# Patient Record
Sex: Female | Born: 1955 | Race: White | Hispanic: No | Marital: Married | Smoking: Former smoker
Health system: Southern US, Community
[De-identification: ages and names within clinical notes are randomized; demographics above are authoritative.]

## PROBLEM LIST (undated history)

## (undated) DIAGNOSIS — M1990 Unspecified osteoarthritis, unspecified site: Secondary | ICD-10-CM

## (undated) DIAGNOSIS — T4145XA Adverse effect of unspecified anesthetic, initial encounter: Secondary | ICD-10-CM

## (undated) DIAGNOSIS — K589 Irritable bowel syndrome without diarrhea: Secondary | ICD-10-CM

## (undated) DIAGNOSIS — F4481 Dissociative identity disorder: Secondary | ICD-10-CM

## (undated) DIAGNOSIS — R51 Headache: Secondary | ICD-10-CM

## (undated) DIAGNOSIS — T8859XA Other complications of anesthesia, initial encounter: Secondary | ICD-10-CM

## (undated) DIAGNOSIS — N200 Calculus of kidney: Secondary | ICD-10-CM

## (undated) DIAGNOSIS — K219 Gastro-esophageal reflux disease without esophagitis: Secondary | ICD-10-CM

## (undated) DIAGNOSIS — E119 Type 2 diabetes mellitus without complications: Secondary | ICD-10-CM

## (undated) DIAGNOSIS — R519 Headache, unspecified: Secondary | ICD-10-CM

## (undated) DIAGNOSIS — H532 Diplopia: Secondary | ICD-10-CM

## (undated) DIAGNOSIS — G629 Polyneuropathy, unspecified: Secondary | ICD-10-CM

## (undated) DIAGNOSIS — I10 Essential (primary) hypertension: Secondary | ICD-10-CM

## (undated) DIAGNOSIS — J449 Chronic obstructive pulmonary disease, unspecified: Secondary | ICD-10-CM

## (undated) DIAGNOSIS — Z8489 Family history of other specified conditions: Secondary | ICD-10-CM

## (undated) DIAGNOSIS — F319 Bipolar disorder, unspecified: Secondary | ICD-10-CM

## (undated) DIAGNOSIS — R7303 Prediabetes: Secondary | ICD-10-CM

## (undated) DIAGNOSIS — R32 Unspecified urinary incontinence: Secondary | ICD-10-CM

## (undated) DIAGNOSIS — L719 Rosacea, unspecified: Secondary | ICD-10-CM

## (undated) DIAGNOSIS — B029 Zoster without complications: Secondary | ICD-10-CM

## (undated) DIAGNOSIS — G8929 Other chronic pain: Secondary | ICD-10-CM

## (undated) DIAGNOSIS — R232 Flushing: Secondary | ICD-10-CM

## (undated) DIAGNOSIS — F329 Major depressive disorder, single episode, unspecified: Secondary | ICD-10-CM

## (undated) DIAGNOSIS — K279 Peptic ulcer, site unspecified, unspecified as acute or chronic, without hemorrhage or perforation: Secondary | ICD-10-CM

## (undated) DIAGNOSIS — M549 Dorsalgia, unspecified: Secondary | ICD-10-CM

## (undated) DIAGNOSIS — G4730 Sleep apnea, unspecified: Secondary | ICD-10-CM

## (undated) DIAGNOSIS — Z79899 Other long term (current) drug therapy: Secondary | ICD-10-CM

## (undated) DIAGNOSIS — F410 Panic disorder [episodic paroxysmal anxiety] without agoraphobia: Secondary | ICD-10-CM

## (undated) DIAGNOSIS — M542 Cervicalgia: Secondary | ICD-10-CM

## (undated) DIAGNOSIS — F32A Depression, unspecified: Secondary | ICD-10-CM

## (undated) HISTORY — DX: Polyneuropathy, unspecified: G62.9

## (undated) HISTORY — DX: Zoster without complications: B02.9

## (undated) HISTORY — PX: TONSILLECTOMY: SUR1361

## (undated) HISTORY — PX: RECTAL SURGERY: SHX760

## (undated) HISTORY — DX: Flushing: R23.2

## (undated) HISTORY — PX: TOTAL ABDOMINAL HYSTERECTOMY: SHX209

## (undated) HISTORY — DX: Diplopia: H53.2

## (undated) HISTORY — DX: Gastro-esophageal reflux disease without esophagitis: K21.9

## (undated) HISTORY — PX: ABDOMINAL HYSTERECTOMY: SHX81

## (undated) HISTORY — DX: Other long term (current) drug therapy: Z79.899

## (undated) HISTORY — DX: Irritable bowel syndrome, unspecified: K58.9

## (undated) HISTORY — DX: Peptic ulcer, site unspecified, unspecified as acute or chronic, without hemorrhage or perforation: K27.9

## (undated) HISTORY — DX: Rosacea, unspecified: L71.9

---

## 1998-06-10 HISTORY — PX: FOOT ARTHRODESIS: SHX1655

## 2003-10-25 ENCOUNTER — Ambulatory Visit (HOSPITAL_COMMUNITY): Admission: RE | Admit: 2003-10-25 | Discharge: 2003-10-25 | Payer: Self-pay | Admitting: Family Medicine

## 2003-11-17 ENCOUNTER — Encounter (HOSPITAL_COMMUNITY): Admission: RE | Admit: 2003-11-17 | Discharge: 2003-12-17 | Payer: Self-pay | Admitting: Orthopedic Surgery

## 2003-12-20 ENCOUNTER — Encounter (HOSPITAL_COMMUNITY): Admission: RE | Admit: 2003-12-20 | Discharge: 2004-01-19 | Payer: Self-pay | Admitting: Orthopedic Surgery

## 2004-02-17 ENCOUNTER — Ambulatory Visit (HOSPITAL_COMMUNITY): Admission: RE | Admit: 2004-02-17 | Discharge: 2004-02-17 | Payer: Self-pay | Admitting: General Surgery

## 2004-04-11 ENCOUNTER — Ambulatory Visit (HOSPITAL_COMMUNITY): Admission: RE | Admit: 2004-04-11 | Discharge: 2004-04-11 | Payer: Self-pay | Admitting: Family Medicine

## 2004-05-05 ENCOUNTER — Emergency Department (HOSPITAL_COMMUNITY): Admission: EM | Admit: 2004-05-05 | Discharge: 2004-05-05 | Payer: Self-pay | Admitting: Emergency Medicine

## 2004-11-07 ENCOUNTER — Ambulatory Visit (HOSPITAL_COMMUNITY): Admission: RE | Admit: 2004-11-07 | Discharge: 2004-11-07 | Payer: Self-pay | Admitting: Podiatry

## 2004-12-10 ENCOUNTER — Emergency Department (HOSPITAL_COMMUNITY): Admission: EM | Admit: 2004-12-10 | Discharge: 2004-12-10 | Payer: Self-pay | Admitting: Emergency Medicine

## 2005-06-02 ENCOUNTER — Emergency Department (HOSPITAL_COMMUNITY): Admission: EM | Admit: 2005-06-02 | Discharge: 2005-06-02 | Payer: Self-pay | Admitting: Emergency Medicine

## 2005-06-24 ENCOUNTER — Emergency Department (HOSPITAL_COMMUNITY): Admission: EM | Admit: 2005-06-24 | Discharge: 2005-06-24 | Payer: Self-pay | Admitting: Emergency Medicine

## 2005-06-28 ENCOUNTER — Emergency Department (HOSPITAL_COMMUNITY): Admission: EM | Admit: 2005-06-28 | Discharge: 2005-06-28 | Payer: Self-pay | Admitting: Emergency Medicine

## 2005-07-17 ENCOUNTER — Encounter (INDEPENDENT_AMBULATORY_CARE_PROVIDER_SITE_OTHER): Payer: Self-pay | Admitting: General Surgery

## 2005-07-17 ENCOUNTER — Ambulatory Visit (HOSPITAL_COMMUNITY): Admission: RE | Admit: 2005-07-17 | Discharge: 2005-07-17 | Payer: Self-pay | Admitting: General Surgery

## 2005-09-17 ENCOUNTER — Emergency Department (HOSPITAL_COMMUNITY): Admission: EM | Admit: 2005-09-17 | Discharge: 2005-09-17 | Payer: Self-pay | Admitting: Emergency Medicine

## 2005-10-21 ENCOUNTER — Ambulatory Visit (HOSPITAL_COMMUNITY): Admission: RE | Admit: 2005-10-21 | Discharge: 2005-10-21 | Payer: Self-pay | Admitting: Family Medicine

## 2006-02-25 ENCOUNTER — Ambulatory Visit (HOSPITAL_COMMUNITY): Admission: RE | Admit: 2006-02-25 | Discharge: 2006-02-25 | Payer: Self-pay | Admitting: Family Medicine

## 2006-08-11 ENCOUNTER — Ambulatory Visit (HOSPITAL_COMMUNITY): Admission: RE | Admit: 2006-08-11 | Discharge: 2006-08-11 | Payer: Self-pay | Admitting: Family Medicine

## 2007-02-27 ENCOUNTER — Ambulatory Visit (HOSPITAL_COMMUNITY): Admission: RE | Admit: 2007-02-27 | Discharge: 2007-02-27 | Payer: Self-pay | Admitting: Family Medicine

## 2007-06-15 ENCOUNTER — Ambulatory Visit (HOSPITAL_COMMUNITY)
Admission: RE | Admit: 2007-06-15 | Discharge: 2007-06-15 | Payer: Self-pay | Admitting: Physical Medicine and Rehabilitation

## 2007-07-29 ENCOUNTER — Ambulatory Visit (HOSPITAL_COMMUNITY): Admission: RE | Admit: 2007-07-29 | Discharge: 2007-07-29 | Payer: Self-pay | Admitting: Family Medicine

## 2009-03-22 ENCOUNTER — Ambulatory Visit (HOSPITAL_COMMUNITY): Admission: RE | Admit: 2009-03-22 | Discharge: 2009-03-22 | Payer: Self-pay | Admitting: Podiatry

## 2009-03-22 ENCOUNTER — Encounter (INDEPENDENT_AMBULATORY_CARE_PROVIDER_SITE_OTHER): Payer: Self-pay | Admitting: Podiatry

## 2009-06-24 ENCOUNTER — Emergency Department (HOSPITAL_COMMUNITY): Admission: EM | Admit: 2009-06-24 | Discharge: 2009-06-24 | Payer: Self-pay | Admitting: Emergency Medicine

## 2009-09-08 ENCOUNTER — Encounter: Admission: RE | Admit: 2009-09-08 | Discharge: 2009-09-08 | Payer: Self-pay | Admitting: Gastroenterology

## 2009-09-08 ENCOUNTER — Encounter: Payer: Self-pay | Admitting: Gastroenterology

## 2009-09-18 ENCOUNTER — Ambulatory Visit (HOSPITAL_COMMUNITY): Admission: RE | Admit: 2009-09-18 | Discharge: 2009-09-18 | Payer: Self-pay | Admitting: Family Medicine

## 2009-11-29 ENCOUNTER — Ambulatory Visit (HOSPITAL_COMMUNITY): Admission: RE | Admit: 2009-11-29 | Discharge: 2009-11-29 | Payer: Self-pay | Admitting: Rheumatology

## 2010-06-06 ENCOUNTER — Emergency Department (HOSPITAL_COMMUNITY)
Admission: EM | Admit: 2010-06-06 | Discharge: 2010-06-06 | Payer: Self-pay | Source: Home / Self Care | Admitting: Emergency Medicine

## 2010-06-30 ENCOUNTER — Encounter: Payer: Self-pay | Admitting: Family Medicine

## 2010-07-01 ENCOUNTER — Encounter: Payer: Self-pay | Admitting: Family Medicine

## 2010-07-01 ENCOUNTER — Encounter: Payer: Self-pay | Admitting: Gastroenterology

## 2010-08-14 ENCOUNTER — Ambulatory Visit (HOSPITAL_COMMUNITY): Payer: Self-pay | Admitting: Physical Therapy

## 2010-08-21 ENCOUNTER — Ambulatory Visit (HOSPITAL_COMMUNITY)
Admission: RE | Admit: 2010-08-21 | Discharge: 2010-08-21 | Disposition: A | Discharge status: Home / Self Care | Payer: Medicaid Other | Source: Ambulatory Visit | Attending: Orthopedic Surgery | Admitting: Orthopedic Surgery

## 2010-08-21 DIAGNOSIS — IMO0001 Reserved for inherently not codable concepts without codable children: Secondary | ICD-10-CM | POA: Insufficient documentation

## 2010-08-21 DIAGNOSIS — M25579 Pain in unspecified ankle and joints of unspecified foot: Secondary | ICD-10-CM | POA: Insufficient documentation

## 2010-08-21 DIAGNOSIS — M25676 Stiffness of unspecified foot, not elsewhere classified: Secondary | ICD-10-CM | POA: Insufficient documentation

## 2010-08-21 DIAGNOSIS — M25673 Stiffness of unspecified ankle, not elsewhere classified: Secondary | ICD-10-CM | POA: Insufficient documentation

## 2010-08-21 DIAGNOSIS — M6281 Muscle weakness (generalized): Secondary | ICD-10-CM | POA: Insufficient documentation

## 2010-08-21 DIAGNOSIS — R262 Difficulty in walking, not elsewhere classified: Secondary | ICD-10-CM | POA: Insufficient documentation

## 2010-09-13 LAB — BASIC METABOLIC PANEL WITH GFR
BUN: 16 mg/dL (ref 6–23)
CO2: 30 meq/L (ref 19–32)
Calcium: 9.8 mg/dL (ref 8.4–10.5)
Chloride: 103 meq/L (ref 96–112)
Creatinine, Ser: 0.69 mg/dL (ref 0.4–1.2)
GFR calc Af Amer: >60 mL/min (ref >60)
GFR calc non Af Amer: >60 mL/min (ref >60)
Glucose, Bld: 89 mg/dL (ref 70–99)
Potassium: 4 meq/L (ref 3.5–5.1)
Sodium: 139 meq/L (ref 135–145)

## 2010-09-13 LAB — HEMOGLOBIN AND HEMATOCRIT, BLOOD
HCT: 42.7 % (ref 36.0–46.0)
Hemoglobin: 14.5 g/dL (ref 12.0–15.0)

## 2010-10-26 NOTE — Op Note (Signed)
NAME:  Cynthia Deleon, MASK             ACCOUNT NO.:  [id number]   MEDICAL RECORD NO.:  [id number]          PATIENT TYPE:  AMB   LOCATION:  DAY                           FACILITY:  [facility]   PHYSICIAN:  Dalia Heading, M.D.  DATE OF BIRTH:  1955-08-02   DATE OF PROCEDURE:  07/17/2005  DATE OF DISCHARGE:                                 OPERATIVE REPORT   PREOPERATIVE DIAGNOSIS:  Cyst, right earlobe, and foreign body, face.   POSTOPERATIVE DIAGNOSIS:  Cyst, right earlobe, and foreign body, face.   PROCEDURE:  Excision of cyst, right earlobe, and foreign body removal, face.   SURGEON:  Dr. Franky Macho.   ANESTHESIA:  General.   INDICATIONS:  The patient is a 55 year old white female who presents with a  recurrent, enlarging cyst of the right ear lobe as well as the tip of the  lead pencil in the subcutaneous tissue of the right jaw. The patient now  comes for excision of both the cyst on the right earlobe and removal of  foreign body, face at the right jaw line. Risks and benefits of the  procedures including bleeding and infection were fully explained to the  patient, who gave informed consent.   PROCEDURE NOTE:  The patient was placed in supine position. After general  anesthesia was administered, the right ear and right jaw line of the face  were prepped and draped using the usual sterile technique with Betadine.  Surgical site confirmation was performed.   An incision was made posteriorly in the right ear lobe. A sebaceous cyst was  found. This was removed along with its wall without difficulty. Any bleeding  was controlled using Bovie electrocautery. One percent Xylocaine was  instilled into the region. The incision was closed using 6-0 nylon  interrupted sutures. Bacitracin ointment was then applied.   Next, an elliptical incision was made around the pencil tip that was  imbedded in the skin along the right jaw line. This was excised without  difficulty. The incision was closed  using 6-0 nylon interrupted sutures. One  percent Xylocaine was instilled. Bacitracin ointment was then applied.   All tape and needle counts were correct at the end of the procedure. The  patient was awakened and transferred to PACU in stable condition.   COMPLICATIONS:  None.   SPECIMEN:  Cyst, right earlobe.   BLOOD LOSS:  Minimal.      Dalia Heading, M.D.  Electronically Signed     MAJ/MEDQ  D:  07/17/2005  T:  07/17/2005  Job:  [id number]   cc:   [facility] Department

## 2010-10-26 NOTE — Op Note (Signed)
NAME:  Cynthia Deleon, Cynthia Deleon             ACCOUNT NO.:  [id number]   MEDICAL RECORD NO.:  [id number]          PATIENT TYPE:  AMB   LOCATION:  DAY                           FACILITY:  [facility]   PHYSICIAN:  Cody M. Ulice Brilliant, D.P.M.  DATE OF BIRTH:  [birth year]   DATE OF PROCEDURE:  11/07/2004  DATE OF DISCHARGE:                                 OPERATIVE REPORT   PREOPERATIVE DIAGNOSES:  Neuroma, second web space, left foot; long second  metatarsal, left foot.   POSTOPERATIVE DIAGNOSES:  Neuroma, second web space, left foot;  long second  metatarsal, left foot.   PROCEDURE PERFORMED:  Excision of neuroma, second web space, left foot;  shortening second metatarsal osteotomy, left foot.   SURGEON:  Denny Peon. Ulice Brilliant, D.P.M.   ANESTHESIA:  MAC.   INDICATIONS FOR SURGERY:  A 56 year old white female who has had a greater  than two-year history of pain under the ball of the left foot centered  around the second and third toes.  The pain has been treated conservatively  with injection therapy.  The patient is clinically noted to have pain to  palpation distal to the second metatarsal head and in the second web space.  Radiographs revealed that she has a long second metatarsal approximately  0.75-1 cm longer than the third or the first.  The patient has a neuroma in  the second web space and a long second metatarsal which is accounting for  chronic capsulitis.   DESCRIPTION OF PROCEDURE:  Cynthia Deleon is brought into the OR and placed  on the table in the supine position.  IV sedation is established.  A  posterior tibial nerve block is performed about the left ankle.  Further  local anesthesia is administered dorsal and between the metatarsals, first,  second, and third, left foot.  A pneumatic ankle tourniquet is then applied  over appropriate cast padding to her left ankle.  Her foot is prepped and  draped in the usual aseptic fashion.  An Ace bandage is utilized to  exsanguinate her foot.  The  tourniquet is inflated to 250 mmHg.   PROCEDURE:  Excision of neuroma, second web space, with shortening second  metatarsal osteotomy.  Attention is directed to the dorsal aspect of the  left foot.  A curvilinear skin incision is made beginning in the second web  space extending proximally and then curving medially over the second  metatarsal head and then curving back proximally along the second metatarsal  shaft.  The incision is deepened via sharp and blunt dissection through  subcutaneous tissues.  Any crossing vessels that could not be adequately  retracted are clamped and cauterized.  Attention is directed first into the  second web space through this skin incision.  The incision is deepened  through the second web space to the actual neuroma itself.  This is noted to  be relative hypertrophic in nature, large, swollen.  Its digital branches  are traced out onto the medial aspect of the third toe and lateral aspect of  the second toe.  It is then transected at this level utilizing iris  scissors  and then dissected back proximally to a level just proximal to the  metatarsal heads utilizing a combination of sharp and blunt dissection.  At  this level, it is then excised.  The wound is inspected and found to be free  of any remaining neural branches from this neuroma.  The wound is flushed  with copious amounts of irrigant, and two horizontal mattress sutures of 4-0  Prolene are utilized to close subcutaneous tissue in this region.   Attention is then directed to the second metatarsal head.  The second  metatarsal is appreciated via blunt palpation, and the extensor tendon to  the second toe is undermined and retracted laterally.  A second metatarsal  and capsular incision is then made.  The capsular and periosteal tissues are  reflected away from the surgical neck of the bone.  An oblique osteotomy is  the first begun with an #62 blade on the oscillating saw and then completed   through the plantar cortical surface with the 62 blade.  The amount of  shortening via this cut is appreciated, and the metatarsal is repositioned  proximally so that it is equal in length to the third and to the first.  This is then fixated via 2.0 x 12-mm OsteoMed screw in standard fashion with  good purchase noted.  The osteotomy is deemed stable.  The wound is flushed  with copious amounts of irrigant.  Redundant bone dorsally is removed with a  bone rongeur.  Capsular and periosteal tissues are then reapproximated and  closed with 4-0 Vicryl.  Subcutaneous tissues are reapproximated and closed  over the tendon utilizing 4-0 Vicryl.  The skin is closed with 4-0 Vicryl in  a subcuticular suture along the entire length of the incision.  A  postoperative injection of Hexadrol is dispensed.  Steri-Strips are applied  across the incision.  A Betadine-soaked Adaptic dressing and a dry sterile  compressive dressing follows.  The tourniquet is deflated with tourniquet  time spanning 45 minutes.   Cynthia Deleon tolerates the anesthesia and procedure well.  She is  transported to [facility] without incident.  While there, a list of  written instructions are explained to her.  A prescription for oxycodone  5/500 mg Tylenol is dispensed.  Phenergan 25 mg, dispense 25, is dispensed.  A list of written instructions are explained.  A cam walker and a Darco  surgical shoe are dispensed.  She will be seen within seven days for first  postoperative visit.      CMD/MEDQ  D:  11/07/2004  T:  11/07/2004  Job:  [id number]

## 2010-10-26 NOTE — H&P (Signed)
NAME:  Cynthia Deleon, Cynthia Deleon NO.:  [id number]   MEDICAL RECORD NO.:  [id number]          PATIENT TYPE:  EMS   LOCATION:  ED                            FACILITY:  [facility]   PHYSICIAN:  Dalia Heading, M.D.  DATE OF BIRTH:  June 04, 1956   DATE OF ADMISSION:  06/28/2005  DATE OF DISCHARGE:  01/19/2007LH                                HISTORY & PHYSICAL   CHIEF COMPLAINT:  Recurrent cyst, right ear lobe, and foreign body on face.   HISTORY OF PRESENT ILLNESS:  The patient is a 55 year old white female who  presents with a recurrent, enlarging cyst in the right earlobe.  This was  initially excised two years ago and has recurred.  She also has the tip of a  lead point from a pencil embedded in the subcutaneous tissue in the right  jaw region.  She would also like that removed.   PAST MEDICAL HISTORY:  Reflux disease.   PAST SURGICAL HISTORY:  Hysterectomy, multiple surgeries for foot, eyes and  nose.   CURRENT MEDICATIONS:  1.  Nexium.  2.  Hormone pill.   ALLERGIES:  HYDROCODONE.   SOCIAL HISTORY:  The patient smokes a half a pack of cigarettes a day.  She  denies any alcohol use.   PHYSICAL EXAMINATION:  GENERAL APPEARANCE:  The patient is a well-developed,  well-nourished white female in no acute distress.  LUNGS:  Clear to auscultation with good breath sounds bilaterally.  HEART:  Regular rate and rhythm without S3, S4 or murmurs.  SKIN:  A cyst in the right earlobe as well as a foreign body in the  subcutaneous tissue of the right jaw line.   IMPRESSION:  1.  Recurrent cyst, right earlobe.  2.  Foreign body, face.   PLAN:  The patient was scheduled for excision of the cyst of the right  earlobe and excision of the foreign body on the face on July 17, 2005.  Risks and benefits of the procedures including bleeding, infection, and  recurrence of the cysts were fully explained to the patient.  Gave informed  consent.      Dalia Heading, M.D.  Electronically Signed     MAJ/MEDQ  D:  07/09/2005  T:  07/09/2005  Job:  [id number]   cc:   Short Stay at [facility] Department of [facility]

## 2010-10-26 NOTE — H&P (Signed)
NAME:  Cynthia Deleon, Cynthia Deleon                       ACCOUNT NO.:  [id number]   MEDICAL RECORD NO.:  [id number]                   PATIENT TYPE:  REC   LOCATION:  REH                                  FACILITY:  [facility]   PHYSICIAN:  Dalia Heading, M.D.               DATE OF BIRTH:  23-Feb-1956   DATE OF ADMISSION:  DATE OF DISCHARGE:                                HISTORY & PHYSICAL   CHIEF COMPLAINT:  Cyst, right ear lobe.   HISTORY OF PRESENT ILLNESS:  The patient is a 55 year old white female who  was referred for excision of a cyst on the right earlobe.  It has been  present for some time, but has been draining.   PAST MEDICAL HISTORY:  Reflux disease.   PAST SURGICAL HISTORY:  1.  Hysterectomy.  2.  Nose, eye and foot surgeries.   CURRENT MEDICATIONS:  1.  Nexium.  2.  Hormone pill.   ALLERGIES:  Hydrocodone.   SOCIAL HISTORY:  The patient smokes one-half pack of cigarettes a day.  She  denies any significant alcohol use.   PHYSICAL EXAMINATION:  GENERAL:  The patient is a well-developed, well-  nourished white female, in no acute distress.  VITAL SIGNS:  She is afebrile.  Vital signs are stable.  LUNGS:  Clear to auscultation with equal breath sounds bilaterally.  HEART:  Examination reveals a regular rate and rhythm without S3, S4 or  murmurs.  HEENT:  Right ear examination reveals a 75-mm oval mass in the right  earlobe.  It is mobile.  No drainage has been noted.   IMPRESSION:  Cyst, right earlobe.   PLAN:  The patient is scheduled for excision of the cyst, right earlobe, on  February 17, 2004.  The risks and benefits of the procedure were fully  explained to the patient who gave informed consent.    ___________________________________________                                         Dalia Heading, M.D.   MAJ/MEDQ  D:  02/16/2004  T:  02/16/2004  Job:  [id number]   cc:   Health Department

## 2010-10-26 NOTE — Op Note (Signed)
NAME:  Cynthia Deleon, Cynthia Deleon                       ACCOUNT NO.:  [id number]   MEDICAL RECORD NO.:  [id number]                   PATIENT TYPE:  AMB   LOCATION:  DAY                                  FACILITY:  [facility]   PHYSICIAN:  Dalia Heading, M.D.               DATE OF BIRTH:  [birth year]   DATE OF PROCEDURE:  02/17/2004  DATE OF DISCHARGE:                                 OPERATIVE REPORT   PREOPERATIVE DIAGNOSIS:  Mass, right earlobe.   POSTOPERATIVE DIAGNOSIS:  Sebaceous cyst, right earlobe.   PROCEDURE:  Excision of sebaceous cyst, right earlobe.   SURGEON:  Dalia Heading, M.D.   ANESTHESIA:  1% Xylocaine.   INDICATIONS FOR PROCEDURE:  The patient is a 55 year old white female who  presents with an enlarging mass in the right earlobe.  The risks and  benefits of the procedure were fully explained to the patient who gave  informed consent.   DESCRIPTION OF PROCEDURE:  The patient was placed in the supine position.  The right earlobe was prepped and draped using the usual sterile technique  with Betadine.  Surgical site confirmation was performed.   Xylocaine 1% was used for local anesthesia.  This was instilled into the  posterior region of the right earlobe.  An incision was then made, and a  sebaceous cyst was found.  This was excised without difficulty.  Any  bleeding was controlled using Bovie electrocautery.  The specimen was sent  to pathology for further examination.  The skin was closed using a 5-0 nylon  interrupted suture.  Neosporin ointment was then applied.   All tape and needle counts were correct at the end of the procedure.  The  patient was discharged in stable condition.   COMPLICATIONS:  None.   SPECIMENS:  Sebaceous cyst, right earlobe.   ESTIMATED BLOOD LOSS:  Minimal.      ___________________________________________                                            Dalia Heading, M.D.   MAJ/MEDQ  D:  02/17/2004  T:  02/17/2004  Job:  [id number]

## 2010-10-26 NOTE — H&P (Signed)
NAME:  Cynthia Deleon, Cynthia Deleon             ACCOUNT NO.:  [id number]   MEDICAL RECORD NO.:  [id number]          PATIENT TYPE:  AMB   LOCATION:  DAY                           FACILITY:  [facility]   PHYSICIAN:  Cody M. Ulice Brilliant, D.P.M.  DATE OF BIRTH:  08-22-1955   DATE OF ADMISSION:  DATE OF DISCHARGE:  LH                                HISTORY & PHYSICAL   This is a preoperative H&P on a patient who is scheduled for surgery  tomorrow at [facility].   HISTORY OF PRESENT ILLNESS:  Cynthia Deleon has had longstanding pain in the  plantar aspect of both feet in the balls of the feet.  The left is worse  than right.  This has been present for several months, actually years.  She  has been treated on several occasions for this, dating back to November 23, 2003.  Cynthia Deleon has previously had an Eliberto Ivory bunionectomy performed on the left  foot dating back to the late 1990s.  Since that time she has had some  increase in pain in the ball of her foot that has gotten worse over the last  2 years.  She has been treated with injection therapy, which has helped on  occasion.  However, it is getting to the point now where that is no longer  that helpful.   PAST MEDICAL HISTORY:  Significant for anxiety.  She also has a history of  esophageal reflux.   The medications she is taking regularly at this point include hormone  replacement therapy, Nexium and Valium.   ALLERGIES:  SULFA medication.   PAST SURGICAL HISTORY:  Includes previous left foot surgery, hysterectomy,  rectal surgery times two, tonsillectomy and nasal surgery.   OBJECTIVE:  Cynthia Deleon is a tall, relatively anxious white female noted to have  palpable pedal pulses.  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  She is uncomfortable with deep  palpation in the second webspace and at the second MTP plantarly.  Radiographs have been taken, and she has a residual long second metatarsal.   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   PLAN:  She needs to have the neuroma excised.  I have also suggested to  Cynthia Deleon that while we are doing the neuroma we should consider shortening the  second metatarsal to reduce weightbearing pressure in the area, and she is  in agreement.  We are going to do this at [facility] under  monitored anesthesia care.  Cynthia Deleon has read her consent form,  apparently understood and signed.    CMD/MEDQ  D:  11/06/2004  T:  11/06/2004  Job:  [id number]

## 2011-01-18 ENCOUNTER — Emergency Department (HOSPITAL_COMMUNITY)
Admission: EM | Admit: 2011-01-18 | Discharge: 2011-01-19 | Disposition: A | Discharge status: Home / Self Care | Payer: Medicaid Other | Attending: Emergency Medicine | Admitting: Emergency Medicine

## 2011-01-18 ENCOUNTER — Encounter: Payer: Self-pay | Admitting: Emergency Medicine

## 2011-01-18 DIAGNOSIS — F17200 Nicotine dependence, unspecified, uncomplicated: Secondary | ICD-10-CM | POA: Insufficient documentation

## 2011-01-18 DIAGNOSIS — N200 Calculus of kidney: Secondary | ICD-10-CM | POA: Insufficient documentation

## 2011-01-18 DIAGNOSIS — F319 Bipolar disorder, unspecified: Secondary | ICD-10-CM | POA: Insufficient documentation

## 2011-01-18 HISTORY — DX: Bipolar disorder, unspecified: F31.9

## 2011-01-18 NOTE — ED Notes (Signed)
Patient c/o RLQ pain since 2130; states she has vomited x 3.

## 2011-01-18 NOTE — ED Notes (Signed)
Pt reports right side & lower abdominal pain. Pt states she has vomited x3 since pain started.

## 2011-01-19 ENCOUNTER — Emergency Department (HOSPITAL_COMMUNITY): Payer: Medicaid Other

## 2011-01-19 LAB — BASIC METABOLIC PANEL
BUN: 16 mg/dL (ref 6–23)
CO2: 24 mEq/L (ref 19–32)
Calcium: 9.6 mg/dL (ref 8.4–10.5)
Chloride: 101 mEq/L (ref 96–112)
Creatinine, Ser: 0.87 mg/dL (ref 0.50–1.10)
GFR calc Af Amer: >60 mL/min (ref >60)
GFR calc non Af Amer: >60 mL/min (ref >60)
Glucose, Bld: 115 mg/dL — ABNORMAL HIGH (ref 70–99)
Potassium: 3.2 mEq/L — ABNORMAL LOW (ref 3.5–5.1)
Sodium: 140 mEq/L (ref 135–145)

## 2011-01-19 LAB — URINALYSIS, ROUTINE W REFLEX MICROSCOPIC
Bilirubin Urine: NEGATIVE
Glucose, UA: NEGATIVE mg/dL
Ketones, ur: NEGATIVE mg/dL
Leukocytes, UA: NEGATIVE
Nitrite: NEGATIVE
Protein, ur: NEGATIVE mg/dL
Specific Gravity, Urine: >1.03 — ABNORMAL HIGH (ref 1.005–1.030)
Urobilinogen, UA: 0.2 mg/dL (ref 0.0–1.0)
pH: 5.5 (ref 5.0–8.0)

## 2011-01-19 LAB — URINE MICROSCOPIC-ADD ON

## 2011-01-19 LAB — DIFFERENTIAL
Eosinophils Absolute: 0.3 10*3/uL (ref 0.0–0.7)
Lymphs Abs: 4 10*3/uL (ref 0.7–4.0)
Monocytes Relative: 6 % (ref 3–12)
Neutrophils Relative %: 62 % (ref 43–77)

## 2011-01-19 LAB — CBC
Hemoglobin: 16.4 g/dL — ABNORMAL HIGH (ref 12.0–15.0)
MCH: 31.5 pg (ref 26.0–34.0)
RBC: 5.2 MIL/uL — ABNORMAL HIGH (ref 3.87–5.11)

## 2011-01-19 MED ORDER — CIPROFLOXACIN HCL 500 MG PO TABS: 500.0000 mg | ORAL_TABLET | Freq: Two times a day (BID) | ORAL | Status: AC

## 2011-01-19 MED ORDER — ONDANSETRON HCL 4 MG PO TABS: 4.0000 mg | ORAL_TABLET | Freq: Four times a day (QID) | ORAL | Status: AC

## 2011-01-19 MED ORDER — SODIUM CHLORIDE 0.9 % IV SOLN
Freq: Once | INTRAVENOUS | Status: AC
  Administered 2011-01-19: 1000 mL via INTRAVENOUS

## 2011-01-19 MED ORDER — ONDANSETRON HCL 4 MG/2ML IJ SOLN
4.0000 mg | Freq: Once | INTRAMUSCULAR | Status: AC
  Administered 2011-01-19: 4 mg via INTRAVENOUS
  Filled 2011-01-19: qty 2

## 2011-01-19 MED ORDER — HYDROMORPHONE HCL 1 MG/ML IJ SOLN
1.0000 mg | Freq: Once | INTRAMUSCULAR | Status: AC
  Administered 2011-01-19: 1 mg via INTRAVENOUS
  Filled 2011-01-19: qty 1

## 2011-01-19 MED ORDER — HYDROCODONE-ACETAMINOPHEN 5-325 MG PO TABS: 1.0000 | ORAL_TABLET | ORAL | Status: AC | PRN

## 2011-01-19 MED ORDER — KETOROLAC TROMETHAMINE 30 MG/ML IJ SOLN
30.0000 mg | Freq: Once | INTRAMUSCULAR | Status: AC
  Administered 2011-01-19: 30 mg via INTRAVENOUS
  Filled 2011-01-19: qty 1

## 2011-01-19 MED ORDER — CIPROFLOXACIN HCL 250 MG PO TABS
500.0000 mg | ORAL_TABLET | Freq: Once | ORAL | Status: AC
  Administered 2011-01-19: 500 mg via ORAL
  Filled 2011-01-19: qty 2

## 2011-01-19 NOTE — ED Provider Notes (Signed)
History     CSN: [id number] Arrival date & time: 01/18/2011 11:33 PM  Chief Complaint  Patient presents with  . Abdominal Pain   HPI Comments: Seen 41  Patient is a 55 y.o. female presenting with abdominal pain. The history is provided by the patient.  Abdominal Pain The primary symptoms of the illness include abdominal pain, nausea and vomiting. The current episode started 1 to 2 hours ago (patient ate mashed potatoes for dinner then developed  severe RLQ abdominal pain. Radiates to the right flank). The onset of the illness was sudden. The problem has been rapidly worsening.  The abdominal pain has been rapidly worsening since its onset. The abdominal pain is located in the RLQ. The abdominal pain radiates to the right flank. The severity of the abdominal pain is 9/10.  The patient has not had a change in bowel habit. Associated symptoms comments: Difficulty urinating.    Past Medical History  Diagnosis Date  . Bipolar 1 disorder     Past Surgical History  Procedure Date  . Abdominal hysterectomy   . Tonsillectomy     No family history on file.  History  Substance Use Topics  . Smoking status: Current Everyday Smoker -- 0.5 packs/day    Types: Cigarettes  . Smokeless tobacco: Not on file  . Alcohol Use: Yes     rarely    OB History    Grav Para Term Preterm Abortions TAB SAB Ect Mult Living                  Review of Systems  Gastrointestinal: Positive for nausea, vomiting and abdominal pain.  All other systems reviewed and are negative.    Physical Exam  BP 151/103  Pulse 82  Temp(Src) 97.5 F (36.4 C) (Oral)  Resp 18  Ht 5\' 8"  (1.727 m)  Wt 172 lb (78.019 kg)  BMI 26.15 kg/m2  SpO2 97%  Physical Exam  Nursing note and vitals reviewed. Constitutional: She is oriented to person, place, and time. She appears well-developed and well-nourished.  HENT:  Head: Normocephalic and atraumatic.  Eyes: EOM are normal.  Neck: Normal range of motion. Neck  supple.  Cardiovascular: Normal rate, normal heart sounds and intact distal pulses.   Pulmonary/Chest: Effort normal and breath sounds normal.  Abdominal: Soft.  Genitourinary:       No cva tenderness  Musculoskeletal: Normal range of motion.  Neurological: She is alert and oriented to person, place, and time.  Skin: Skin is warm and dry.    ED Course  Procedures  MDM Patient with first kidney stone. Family h/o stones. CT confirmation of 2 mm R ureterovesicular stone. Pain addressed with success. Discharge with referral to urologist with Rx for analgesic, antibiotic, and antiemetic. MDM Reviewed: nursing note and vitals Interpretation: labs and CT scan         Nicoletta Dress. Colon Branch, MD 01/19/11 [id number]

## 2011-08-02 ENCOUNTER — Emergency Department (HOSPITAL_COMMUNITY)
Admission: EM | Admit: 2011-08-02 | Discharge: 2011-08-02 | Disposition: A | Discharge status: Other Acute Inpatient Hospital | Payer: Medicaid Other | Attending: Emergency Medicine | Admitting: Emergency Medicine

## 2011-08-02 ENCOUNTER — Encounter (HOSPITAL_COMMUNITY): Payer: Self-pay

## 2011-08-02 ENCOUNTER — Inpatient Hospital Stay (HOSPITAL_COMMUNITY)
Admission: AD | Admit: 2011-08-02 | Discharge: 2011-08-08 | DRG: 885 | Disposition: A | Discharge status: Home / Self Care | Payer: 59 | Source: Ambulatory Visit | Attending: Psychiatry | Admitting: Psychiatry

## 2011-08-02 DIAGNOSIS — F17200 Nicotine dependence, unspecified, uncomplicated: Secondary | ICD-10-CM

## 2011-08-02 DIAGNOSIS — M542 Cervicalgia: Secondary | ICD-10-CM

## 2011-08-02 DIAGNOSIS — IMO0002 Reserved for concepts with insufficient information to code with codable children: Secondary | ICD-10-CM

## 2011-08-02 DIAGNOSIS — Z882 Allergy status to sulfonamides status: Secondary | ICD-10-CM

## 2011-08-02 DIAGNOSIS — F4310 Post-traumatic stress disorder, unspecified: Secondary | ICD-10-CM

## 2011-08-02 DIAGNOSIS — M129 Arthropathy, unspecified: Secondary | ICD-10-CM

## 2011-08-02 DIAGNOSIS — F3160 Bipolar disorder, current episode mixed, unspecified: Principal | ICD-10-CM

## 2011-08-02 DIAGNOSIS — F607 Dependent personality disorder: Secondary | ICD-10-CM

## 2011-08-02 DIAGNOSIS — Z888 Allergy status to other drugs, medicaments and biological substances status: Secondary | ICD-10-CM

## 2011-08-02 DIAGNOSIS — G8929 Other chronic pain: Secondary | ICD-10-CM

## 2011-08-02 DIAGNOSIS — Z79899 Other long term (current) drug therapy: Secondary | ICD-10-CM

## 2011-08-02 DIAGNOSIS — F1910 Other psychoactive substance abuse, uncomplicated: Secondary | ICD-10-CM

## 2011-08-02 DIAGNOSIS — F1994 Other psychoactive substance use, unspecified with psychoactive substance-induced mood disorder: Secondary | ICD-10-CM

## 2011-08-02 DIAGNOSIS — F3164 Bipolar disorder, current episode mixed, severe, with psychotic features: Secondary | ICD-10-CM | POA: Insufficient documentation

## 2011-08-02 DIAGNOSIS — F3130 Bipolar disorder, current episode depressed, mild or moderate severity, unspecified: Secondary | ICD-10-CM | POA: Insufficient documentation

## 2011-08-02 DIAGNOSIS — R45851 Suicidal ideations: Secondary | ICD-10-CM

## 2011-08-02 HISTORY — DX: Unspecified osteoarthritis, unspecified site: M19.90

## 2011-08-02 LAB — DIFFERENTIAL
Basophils Relative: 0 % (ref 0–1)
Eosinophils Absolute: 0.2 10*3/uL (ref 0.0–0.7)
Eosinophils Relative: 1 % (ref 0–5)
Lymphs Abs: 3.1 10*3/uL (ref 0.7–4.0)
Monocytes Absolute: 0.7 10*3/uL (ref 0.1–1.0)
Monocytes Relative: 5 % (ref 3–12)

## 2011-08-02 LAB — URINALYSIS, ROUTINE W REFLEX MICROSCOPIC
Bilirubin Urine: NEGATIVE
Glucose, UA: NEGATIVE mg/dL
Hgb urine dipstick: NEGATIVE
Ketones, ur: NEGATIVE mg/dL
Protein, ur: NEGATIVE mg/dL
Urobilinogen, UA: 0.2 mg/dL (ref 0.0–1.0)

## 2011-08-02 LAB — CBC
MCH: 31.6 pg (ref 26.0–34.0)
MCHC: 34.3 g/dL (ref 30.0–36.0)
MCV: 92.1 fL (ref 78.0–100.0)
WBC: 12.3 10*3/uL — ABNORMAL HIGH (ref 4.0–10.5)

## 2011-08-02 LAB — COMPREHENSIVE METABOLIC PANEL
ALT: 10 U/L (ref 0–35)
Albumin: 3.7 g/dL (ref 3.5–5.2)
Calcium: 10 mg/dL (ref 8.4–10.5)
GFR calc Af Amer: >90 mL/min (ref >90)
Glucose, Bld: 83 mg/dL (ref 70–99)
Sodium: 138 mEq/L (ref 135–145)
Total Protein: 7.2 g/dL (ref 6.0–8.3)

## 2011-08-02 LAB — ETHANOL: Alcohol, Ethyl (B): <11 mg/dL (ref 0–11)

## 2011-08-02 LAB — RAPID URINE DRUG SCREEN, HOSP PERFORMED
Amphetamines: NOT DETECTED
Barbiturates: NOT DETECTED
Opiates: NOT DETECTED
Tetrahydrocannabinol: NOT DETECTED

## 2011-08-02 LAB — GLUCOSE, CAPILLARY: Glucose-Capillary: 138 mg/dL — ABNORMAL HIGH (ref 70–99)

## 2011-08-02 LAB — ACETAMINOPHEN LEVEL: Acetaminophen (Tylenol), Serum: <15 ug/mL (ref 10–30)

## 2011-08-02 MED ORDER — ONDANSETRON HCL 8 MG PO TABS
4.0000 mg | ORAL_TABLET | Freq: Three times a day (TID) | ORAL | Status: DC | PRN
  Administered 2011-08-02: 4 mg via ORAL
  Filled 2011-08-02: qty 1

## 2011-08-02 MED ORDER — ALPRAZOLAM ER 0.5 MG PO TB24
2.0000 mg | ORAL_TABLET | Freq: Every evening | ORAL | Status: DC | PRN
  Administered 2011-08-03 – 2011-08-04 (×2): 2 mg via ORAL
  Filled 2011-08-02 (×2): qty 4

## 2011-08-02 MED ORDER — OXYCODONE HCL 15 MG PO TB12
15.0000 mg | ORAL_TABLET | Freq: Two times a day (BID) | ORAL | Status: DC | PRN
  Administered 2011-08-03: 15 mg via ORAL
  Filled 2011-08-02: qty 1

## 2011-08-02 MED ORDER — ALPRAZOLAM 0.5 MG PO TABS
2.0000 mg | ORAL_TABLET | Freq: Every evening | ORAL | Status: DC | PRN
Start: 2011-08-02 — End: 2011-08-02

## 2011-08-02 MED ORDER — DIAZEPAM 5 MG PO TABS
10.0000 mg | ORAL_TABLET | Freq: Four times a day (QID) | ORAL | Status: DC | PRN
Start: 2011-08-02 — End: 2011-08-02
  Administered 2011-08-02: 10 mg via ORAL
  Filled 2011-08-02: qty 2

## 2011-08-02 MED ORDER — CHLORPROMAZINE HCL 25 MG PO TABS
25.0000 mg | ORAL_TABLET | Freq: Two times a day (BID) | ORAL | Status: DC
  Administered 2011-08-03 – 2011-08-08 (×11): 25 mg via ORAL
  Filled 2011-08-02 (×15): qty 1

## 2011-08-02 MED ORDER — DARIFENACIN HYDROBROMIDE ER 7.5 MG PO TB24
7.5000 mg | ORAL_TABLET | Freq: Every day | ORAL | Status: DC
  Administered 2011-08-02: 7.5 mg via ORAL
  Filled 2011-08-02: qty 1

## 2011-08-02 MED ORDER — BENZTROPINE MESYLATE 1 MG PO TABS
1.0000 mg | ORAL_TABLET | Freq: Every day | ORAL | Status: DC
  Administered 2011-08-02: 1 mg via ORAL
  Filled 2011-08-02: qty 1

## 2011-08-02 MED ORDER — OXYCODONE HCL 15 MG PO TB12: 15.0000 mg | ORAL_TABLET | Freq: Four times a day (QID) | ORAL | Status: DC | PRN

## 2011-08-02 MED ORDER — TOPIRAMATE 25 MG PO TABS
50.0000 mg | ORAL_TABLET | Freq: Every day | ORAL | Status: DC
  Administered 2011-08-02: 50 mg via ORAL
  Filled 2011-08-02: qty 2

## 2011-08-02 MED ORDER — ALBUTEROL SULFATE HFA 108 (90 BASE) MCG/ACT IN AERS
1.0000 | INHALATION_SPRAY | Freq: Four times a day (QID) | RESPIRATORY_TRACT | Status: DC | PRN
Start: 2011-08-02 — End: 2011-08-02
  Filled 2011-08-02: qty 6.7

## 2011-08-02 MED ORDER — ZIPRASIDONE HCL 20 MG PO CAPS
20.0000 mg | ORAL_CAPSULE | Freq: Two times a day (BID) | ORAL | Status: DC
  Administered 2011-08-02: 20 mg via ORAL
  Filled 2011-08-02 (×2): qty 1

## 2011-08-02 MED ORDER — DIAZEPAM 5 MG PO TABS
10.0000 mg | ORAL_TABLET | Freq: Four times a day (QID) | ORAL | Status: DC | PRN
  Administered 2011-08-03 (×2): 10 mg via ORAL
  Filled 2011-08-02 (×2): qty 2

## 2011-08-02 MED ORDER — PANTOPRAZOLE SODIUM 40 MG PO TBEC
40.0000 mg | DELAYED_RELEASE_TABLET | Freq: Two times a day (BID) | ORAL | Status: DC
  Administered 2011-08-03 – 2011-08-08 (×12): 40 mg via ORAL
  Filled 2011-08-02 (×17): qty 1

## 2011-08-02 MED ORDER — ALBUTEROL SULFATE HFA 108 (90 BASE) MCG/ACT IN AERS
1.0000 | INHALATION_SPRAY | Freq: Four times a day (QID) | RESPIRATORY_TRACT | Status: DC | PRN
  Administered 2011-08-03 – 2011-08-05 (×3): 1 via RESPIRATORY_TRACT

## 2011-08-02 MED ORDER — BENZTROPINE MESYLATE 1 MG PO TABS
1.0000 mg | ORAL_TABLET | Freq: Every day | ORAL | Status: DC
  Administered 2011-08-03 – 2011-08-05 (×3): 1 mg via ORAL
  Filled 2011-08-02 (×4): qty 1

## 2011-08-02 MED ORDER — PANTOPRAZOLE SODIUM 40 MG PO TBEC
40.0000 mg | DELAYED_RELEASE_TABLET | Freq: Every day | ORAL | Status: DC
  Administered 2011-08-02: 40 mg via ORAL
  Filled 2011-08-02: qty 1

## 2011-08-02 MED ORDER — ESCITALOPRAM OXALATE 10 MG PO TABS
10.0000 mg | ORAL_TABLET | Freq: Every day | ORAL | Status: DC
  Administered 2011-08-03 – 2011-08-08 (×6): 10 mg via ORAL
  Filled 2011-08-02 (×7): qty 1

## 2011-08-02 MED ORDER — ESCITALOPRAM OXALATE 10 MG PO TABS
10.0000 mg | ORAL_TABLET | Freq: Every day | ORAL | Status: DC
  Administered 2011-08-02: 10 mg via ORAL
  Filled 2011-08-02: qty 1

## 2011-08-02 MED ORDER — ALUM & MAG HYDROXIDE-SIMETH 200-200-20 MG/5ML PO SUSP: 30.0000 mL | ORAL | Status: DC | PRN

## 2011-08-02 MED ORDER — CHLORPROMAZINE HCL 25 MG PO TABS
25.0000 mg | ORAL_TABLET | Freq: Two times a day (BID) | ORAL | Status: DC
  Filled 2011-08-02: qty 1

## 2011-08-02 MED ORDER — HYDROCHLOROTHIAZIDE 12.5 MG PO CAPS
12.5000 mg | ORAL_CAPSULE | Freq: Every day | ORAL | Status: DC
  Administered 2011-08-02: 12.5 mg via ORAL
  Filled 2011-08-02: qty 1

## 2011-08-02 NOTE — ED Notes (Signed)
Pt waiting on security for transport to [facility]

## 2011-08-02 NOTE — ED Notes (Signed)
Family in [place] 

## 2011-08-02 NOTE — ED Provider Notes (Signed)
Patient accepted by behavioral health. She is a voluntary admission.  Cyndra Numbers, MD 08/02/11 [phone]
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
## 2011-08-02 NOTE — ED Provider Notes (Signed)
History     CSN: [id number]  Arrival date & time 08/02/11  1428   First MD Initiated Contact with Patient 08/02/11 1455      Chief Complaint  Patient presents with  . Suicidal    (Consider location/radiation/quality/duration/timing/severity/associated sxs/prior treatment) The history is provided by the patient.   patient states that she is depressed and suicidal and doesn't want to live anymore. She has a history of bipolar 1. He states a couple weeks ago she tried to overdose and kill her self. She states this time she would use razor blades. She states her psychiatrist Dr. Tiburcio Pea states that she's been in inpatient treatment. She is actively suicidal. She states she is on chronic pain medicines and is going to need them.  Past Medical History  Diagnosis Date  . Bipolar 1 disorder     Past Surgical History  Procedure Date  . Abdominal hysterectomy   . Tonsillectomy     No family history on file.  History  Substance Use Topics  . Smoking status: Current Everyday Smoker -- 0.5 packs/day    Types: Cigarettes  . Smokeless tobacco: Not on file  . Alcohol Use: Yes     rarely    OB History    Grav Para Term Preterm Abortions TAB SAB Ect Mult Living                  Review of Systems  Constitutional: Negative for activity change and appetite change.  HENT: Negative for neck stiffness.   Eyes: Negative for pain.  Respiratory: Negative for chest tightness and shortness of breath.   Cardiovascular: Negative for chest pain and leg swelling.  Gastrointestinal: Negative for nausea, vomiting, abdominal pain and diarrhea.  Genitourinary: Negative for flank pain.  Musculoskeletal: Negative for back pain.  Skin: Negative for rash.  Neurological: Negative for weakness, numbness and headaches.  Psychiatric/Behavioral: Negative for behavioral problems.       Depression    Allergies  Codeine  Home Medications   Current Outpatient Rx  Name Route Sig Dispense Refill  .  ALPRAZOLAM 2 MG PO TABS Oral Take 2 mg by mouth at bedtime as needed.      . ARIPIPRAZOLE 15 MG PO TABS Oral Take 15 mg by mouth daily.      [name] DIAZEPAM 10 MG PO TABS Oral Take 10 mg by mouth every 6 (six) hours as needed.        BP 144/84  Pulse 102  Temp(Src) 97.3 F (36.3 C) (Oral)  Resp 20  Ht 5\' 7"  (1.702 m)  Wt 180 lb (81.647 kg)  BMI 28.19 kg/m2  SpO2 99%  Physical Exam  Nursing note and vitals reviewed. Constitutional: She is oriented to person, place, and time. She appears well-developed and well-nourished.  HENT:  Head: Normocephalic and atraumatic.  Eyes: EOM are normal. Pupils are equal, round, and reactive to light.  Neck: Normal range of motion. Neck supple.  Cardiovascular: Normal rate, regular rhythm and normal heart sounds.   No murmur heard. Pulmonary/Chest: Effort normal and breath sounds normal. No respiratory distress. She has no wheezes. She has no rales.  Abdominal: Soft. Bowel sounds are normal. She exhibits no distension. There is no tenderness. There is no rebound and no guarding.  Musculoskeletal: Normal range of motion.  Neurological: She is alert and oriented to person, place, and time. No cranial nerve deficit.  Skin: Skin is warm and dry.  Psychiatric: Her speech is normal.  Depressed    ED Course  Procedures (including critical care time)  Labs Reviewed  GLUCOSE, CAPILLARY - Abnormal; Notable for the following:    Glucose-Capillary 138 (*)    All other components within normal limits  CBC  DIFFERENTIAL  URINALYSIS, ROUTINE W REFLEX MICROSCOPIC  URINE RAPID DRUG SCREEN (HOSP PERFORMED)  ETHANOL  COMPREHENSIVE METABOLIC PANEL  ACETAMINOPHEN LEVEL  SALICYLATE LEVEL   No results found.   No diagnosis found.    MDM  Depression with suicidal thoughts. History of previous attempt. Lab work is reassuring. She will be seen by [facility]. Drug database was reviewed and appears her self reported that narcotics are accurate. At this point  she's not been involuntarily committed, but cannot leave        Juliet Rude. Rubin Payor, MD 08/02/11 1714

## 2011-08-02 NOTE — ED Notes (Signed)
Security notified for patient transport to [facility]

## 2011-08-02 NOTE — ED Notes (Signed)
Pt. States, "I don't want to live anymore"  When asked if she has a plan , pt. States, "I will take razor blades to cut away the pain"

## 2011-08-02 NOTE — ED Notes (Signed)
Pt tearful st's she needs her medication, Valium, Xanax  And other meds. Sitter at bedside. Labs being drawn.

## 2011-08-03 DIAGNOSIS — F4310 Post-traumatic stress disorder, unspecified: Secondary | ICD-10-CM | POA: Diagnosis present

## 2011-08-03 MED ORDER — CEPHALEXIN 500 MG PO CAPS
500.0000 mg | ORAL_CAPSULE | Freq: Four times a day (QID) | ORAL | Status: DC
  Administered 2011-08-03 – 2011-08-08 (×20): 500 mg via ORAL
  Filled 2011-08-03 (×29): qty 1

## 2011-08-03 MED ORDER — DARIFENACIN HYDROBROMIDE ER 15 MG PO TB24
15.0000 mg | ORAL_TABLET | Freq: Every day | ORAL | Status: DC
  Administered 2011-08-03 – 2011-08-08 (×6): 15 mg via ORAL
  Filled 2011-08-03 (×8): qty 1

## 2011-08-03 MED ORDER — PROMETHAZINE HCL 25 MG PO TABS
25.0000 mg | ORAL_TABLET | Freq: Four times a day (QID) | ORAL | Status: DC | PRN
  Administered 2011-08-03 – 2011-08-05 (×3): 25 mg via ORAL
  Filled 2011-08-03 (×3): qty 1

## 2011-08-03 MED ORDER — KETOCONAZOLE 2 % EX SHAM
MEDICATED_SHAMPOO | CUTANEOUS | Status: DC
  Administered 2011-08-08: 08:00:00 via TOPICAL
  Filled 2011-08-03: qty 120

## 2011-08-03 MED ORDER — ACETAMINOPHEN 325 MG PO TABS
650.0000 mg | ORAL_TABLET | Freq: Four times a day (QID) | ORAL | Status: DC | PRN
  Administered 2011-08-03 – 2011-08-07 (×6): 650 mg via ORAL

## 2011-08-03 MED ORDER — ZIPRASIDONE HCL 40 MG PO CAPS
40.0000 mg | ORAL_CAPSULE | Freq: Two times a day (BID) | ORAL | Status: DC
  Administered 2011-08-03 – 2011-08-08 (×11): 40 mg via ORAL
  Filled 2011-08-03 (×6): qty 1
  Filled 2011-08-03: qty 2
  Filled 2011-08-03 (×9): qty 1

## 2011-08-03 MED ORDER — SALINE SPRAY 0.65 % NA SOLN
1.0000 | NASAL | Status: DC | PRN
  Filled 2011-08-03: qty 44

## 2011-08-03 MED ORDER — ALUM & MAG HYDROXIDE-SIMETH 200-200-20 MG/5ML PO SUSP: 30.0000 mL | ORAL | Status: DC | PRN

## 2011-08-03 MED ORDER — ZIPRASIDONE HCL 20 MG PO CAPS
20.0000 mg | ORAL_CAPSULE | Freq: Two times a day (BID) | ORAL | Status: DC
  Administered 2011-08-03: 20 mg via ORAL
  Filled 2011-08-03 (×2): qty 1

## 2011-08-03 MED ORDER — CHLORPROMAZINE HCL 50 MG PO TABS
50.0000 mg | ORAL_TABLET | Freq: Every day | ORAL | Status: DC
  Administered 2011-08-03 – 2011-08-07 (×5): 50 mg via ORAL
  Filled 2011-08-03 (×7): qty 1

## 2011-08-03 MED ORDER — KETOCONAZOLE 2 % EX CREA
TOPICAL_CREAM | Freq: Every day | CUTANEOUS | Status: DC
  Filled 2011-08-03: qty 15

## 2011-08-03 MED ORDER — HYDROCHLOROTHIAZIDE 12.5 MG PO CAPS
12.5000 mg | ORAL_CAPSULE | Freq: Every day | ORAL | Status: DC
  Administered 2011-08-03 – 2011-08-08 (×6): 12.5 mg via ORAL
  Filled 2011-08-03 (×7): qty 1

## 2011-08-03 MED ORDER — MAGNESIUM HYDROXIDE 400 MG/5ML PO SUSP
30.0000 mL | Freq: Every day | ORAL | Status: DC | PRN
  Administered 2011-08-07: 30 mL via ORAL

## 2011-08-03 MED ORDER — OXYCODONE HCL 5 MG PO TABS
15.0000 mg | ORAL_TABLET | Freq: Four times a day (QID) | ORAL | Status: DC | PRN
  Administered 2011-08-03 – 2011-08-05 (×6): 15 mg via ORAL
  Filled 2011-08-03 (×6): qty 3

## 2011-08-03 MED ORDER — OXYCODONE HCL 15 MG PO TB12: 15.0000 mg | ORAL_TABLET | Freq: Four times a day (QID) | ORAL | Status: DC | PRN

## 2011-08-03 MED ORDER — TRIMIPRAMINE MALEATE 50 MG PO CAPS: 200.0000 mg | ORAL_CAPSULE | Freq: Every day | ORAL | Status: DC

## 2011-08-03 MED ORDER — TOPIRAMATE 25 MG PO TABS
50.0000 mg | ORAL_TABLET | Freq: Every day | ORAL | Status: DC
  Administered 2011-08-03 – 2011-08-08 (×6): 50 mg via ORAL
  Filled 2011-08-03 (×8): qty 2

## 2011-08-03 MED ORDER — DIAZEPAM 5 MG PO TABS: 10.0000 mg | ORAL_TABLET | Freq: Four times a day (QID) | ORAL | Status: DC | PRN

## 2011-08-03 NOTE — Tx Team (Signed)
Initial Interdisciplinary Treatment Plan  PATIENT STRENGTHS: (choose at least two) Active sense of humor General fund of knowledge Motivation for treatment/growth Supportive family/friends  PATIENT STRESSORS: Health problems Traumatic event   PROBLEM LIST: Problem List/Patient Goals Date to be addressed Date deferred Reason deferred Estimated date of resolution  Depression 2/22     SI 2/22     Anxiety 2/22     Visual hallucinations/ paranoid 2/22     Medical problems: pt is a poor historian but identifies with SOB, Arthritis, Degenerative disk disease. Pain Managment 2/22                              DISCHARGE CRITERIA:  Improved stabilization in mood, thinking, and/or behavior Medical problems require only outpatient monitoring Motivation to continue treatment in a less acute level of care Need for constant or close observation no longer present Reduction of life-threatening or endangering symptoms to within safe limits Safe-care adequate arrangements made Verbal commitment to aftercare and medication compliance  PRELIMINARY DISCHARGE PLAN: Outpatient therapy Return to previous living arrangement  PATIENT/FAMIILY INVOLVEMENT: This treatment plan has been presented to and reviewed with the patient, Cynthia Deleon.  The patient and family have been given the opportunity to ask questions and make suggestions.  Talitha Givens, RN 08/03/2011, 5:11 AM
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
## 2011-08-03 NOTE — Progress Notes (Signed)
[facility] Group Notes:  (Counselor/Nursing/MHT/Case Management/Adjunct)  08/03/2011 1315  Type of Therapy:  Group Therapy  Participation Level:  Did Not Attend  Chilton Sallade 08/03/2011, 2:26 PM
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
## 2011-08-03 NOTE — Progress Notes (Signed)
Pt had a rough morning due to not having her medications which she has been taking for several years. Reports a history of severe pain in her back which prohibits her from sitting, standing or bending down for long periods of time. Pt has focused on her medications throughout the day. Has called several doctor offices and her pharmacy to have information faxed to [facility]. Has not shared the reason for being here with this Clinical research associate as yet. However, states that she is very worried and will cry. Anxiety is high. Mood and affect are depressed. Given support and reassurance. Medications have been ordered and Pt is settling in.

## 2011-08-03 NOTE — Progress Notes (Signed)
[facility] Adult Inpatient Family/Significant Other Suicide Prevention Education  Suicide Prevention Education:  Education Completed; Pt's daughter Molly Maduro [phone] has been identified by the patient as the family member/significant other with whom the patient will be residing, and identified as the person(s) who will aid the patient in the event of a mental health crisis (suicidal ideations/suicide attempt).  With written consent from the patient, the family member/significant other has been provided the following suicide prevention education, prior to the and/or following the discharge of the patient.  The suicide prevention education provided includes the following:  Suicide risk factors  Suicide prevention and interventions  National Suicide Hotline telephone number  [facility] assessment telephone number  [facility] Emergency Assistance 911  [facility] and/or Residential Mobile Crisis Unit telephone number  Request made of family/significant other to:  Remove weapons (e.g., guns, rifles, knives), all items previously/currently identified as safety concern.    Remove drugs/medications (over-the-counter, prescriptions, illicit drugs), all items previously/currently identified as a safety concern.  The family member/significant other verbalizes understanding of the suicide prevention education information provided.  The family member/significant other agrees to remove the items of safety concern listed above. Guns in the ome, daughter and pt's husband will remove from home.  Purcell Nails 08/03/2011, 2:39 PM
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
## 2011-08-04 MED ORDER — NYSTATIN 100000 UNIT/ML MT SUSP
5.0000 mL | Freq: Four times a day (QID) | OROMUCOSAL | Status: DC
  Administered 2011-08-04 – 2011-08-05 (×3): 500000 [IU] via OROMUCOSAL
  Filled 2011-08-04 (×9): qty 5

## 2011-08-04 MED ORDER — NICOTINE 21 MG/24HR TD PT24
MEDICATED_PATCH | TRANSDERMAL | Status: AC
  Administered 2011-08-04: 21 mg via TRANSDERMAL
  Filled 2011-08-04: qty 1

## 2011-08-04 MED ORDER — NICOTINE 21 MG/24HR TD PT24
21.0000 mg | MEDICATED_PATCH | Freq: Every day | TRANSDERMAL | Status: DC
  Administered 2011-08-04 – 2011-08-05 (×2): 21 mg via TRANSDERMAL
  Filled 2011-08-04 (×6): qty 1

## 2011-08-04 NOTE — Progress Notes (Signed)
  Cynthia Deleon is a 56 y.o. female [id number] 13-Apr-1956  08/02/2011 Principal Problem:  *PTSD (post-traumatic stress disorder)   Mental Status: Alert & oriented mood and affect much calmer/appropriate today is not actively suicidal homicidal or psychotic.  Subjective/Objective: Still asking for "all her meds". Retold that her meds were adjusted according to instructions from her outside Psychiatrist DR. Harris who spoke directly to Dr.J yesterday . Today she focuses on her back pain.Does not go to therapy anymore-has had it for years ,    Filed Vitals:   08/04/11 0731  BP: 114/76  Pulse: 109  Temp:   Resp:     Lab Results:   BMET    Component Value Date/Time   NA 138 08/02/2011 1515   K 3.3* 08/02/2011 1515   CL 101 08/02/2011 1515   CO2 30 08/02/2011 1515   GLUCOSE 83 08/02/2011 1515   BUN 9 08/02/2011 1515   CREATININE 0.76 08/02/2011 1515   CALCIUM 10.0 08/02/2011 1515   GFRNONAA >90 08/02/2011 1515   GFRAA >90 08/02/2011 1515    Medications:  Scheduled:     . benztropine  1 mg Oral Daily  . cephALEXin  500 mg Oral Q6H  . chlorproMAZINE  25 mg Oral BID  . chlorproMAZINE  50 mg Oral QHS  . darifenacin  15 mg Oral Daily  . escitalopram  10 mg Oral Daily  . hydrochlorothiazide  12.5 mg Oral Daily  . ketoconazole   Topical 2 times weekly  . pantoprazole  40 mg Oral BID AC  . topiramate  50 mg Oral Daily  . ziprasidone  40 mg Oral BID WC  . DISCONTD: trimipramine  200 mg Oral QHS     PRN Meds acetaminophen, albuterol, ALPRAZolam, alum & mag hydroxide-simeth, magnesium hydroxide, oxyCODONE, promethazine, sodium chloride, DISCONTD: diazepam, DISCONTD: oxyCODONE, DISCONTD: oxyCODONE Plan : no med changes will order O2 to use with her CPAP from home.   Cynthia Deleon,MICKIE D. 08/04/2011

## 2011-08-04 NOTE — Progress Notes (Signed)
D:  Pt didn't attend wrap up group this evening.  A:  Encouraged pt interactions in groups and with peers.  R:  Pt is safe. Aundria Rud, Hristopher Missildine L, MHT/NS

## 2011-08-04 NOTE — H&P (Signed)
Patient was seen, case reviewed and agree with treatment plan.  
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
## 2011-08-04 NOTE — Progress Notes (Signed)
[name], female   DOB: 1956-05-21, 56 y.o.   MRN: [id number] Pt. denies wanting to act on S/I's by cutting her arms and "bleeding out and dying slowly", but states she are frequent thoughts of doing same. Pt. denies H/I's and A/V/H's.  Pt. Stated she felt very uncomfortable talking about her feelings in the hallway (with no one around), so Pt. led this writer to her room and door was closed.  Pt. endorsed having paranoid ideas that some one or some thing was following her: stated she has often feeling someone is right behind her, but when she turns around, no one is there.  Pt. spoke quietly and without pressured speech: was noted to be walking estlessly about the unit hallway most of the evening.  Pt. c/o low back pain and was given oxycodone for her pain.

## 2011-08-04 NOTE — Progress Notes (Signed)
[facility] Group Notes:  (Counselor/Nursing/MHT/Case Management/Adjunct)  08/04/2011 5:55 PM  Type of Therapy:  Group Therapy  Participation Level:  Active  Participation Quality:  Appropriate  Affect:  Appropriate  Cognitive:  Alert and Appropriate  Insight:  Good  Engagement in Group:  Good  Engagement in Therapy:  Good  Modes of Intervention:  Clarification and Support  Summary of Progress/Problems: Pt. Participated in group on supports and when they are healthy and unhealthy. Each pt. identified who was a support for them and what to do when their supports are not there. Each pt was encouraged to seek support through supports groups and thorough the person that they identified. Pt. stated that her son Forde Radon and friend are a support to her.  Lamar Blinks Solana 08/04/2011, 5:55 PM

## 2011-08-05 MED ORDER — NYSTATIN 100000 UNIT/ML MT SUSP
5.0000 mL | Freq: Four times a day (QID) | OROMUCOSAL | Status: DC
  Administered 2011-08-05 – 2011-08-08 (×13): 500000 [IU] via ORAL
  Filled 2011-08-05 (×20): qty 5

## 2011-08-05 MED ORDER — AMITRIPTYLINE HCL 25 MG PO TABS
25.0000 mg | ORAL_TABLET | Freq: Every day | ORAL | Status: DC
  Administered 2011-08-05 – 2011-08-07 (×3): 25 mg via ORAL
  Filled 2011-08-05 (×5): qty 1

## 2011-08-05 MED ORDER — FLUCONAZOLE 150 MG PO TABS
150.0000 mg | ORAL_TABLET | Freq: Every day | ORAL | Status: DC
  Administered 2011-08-05 – 2011-08-08 (×4): 150 mg via ORAL
  Filled 2011-08-05 (×5): qty 1

## 2011-08-05 MED ORDER — LIDOCAINE 5 % EX PTCH
1.0000 | MEDICATED_PATCH | CUTANEOUS | Status: DC
  Administered 2011-08-06 – 2011-08-08 (×3): 1 via TRANSDERMAL
  Filled 2011-08-05 (×4): qty 1

## 2011-08-05 MED ORDER — GABAPENTIN 300 MG PO CAPS
300.0000 mg | ORAL_CAPSULE | Freq: Three times a day (TID) | ORAL | Status: DC
  Administered 2011-08-05 – 2011-08-06 (×3): 300 mg via ORAL
  Filled 2011-08-05 (×6): qty 1

## 2011-08-05 MED ORDER — TRAZODONE HCL 100 MG PO TABS
100.0000 mg | ORAL_TABLET | Freq: Every day | ORAL | Status: DC
  Administered 2011-08-05 – 2011-08-07 (×3): 100 mg via ORAL
  Filled 2011-08-05 (×4): qty 1

## 2011-08-05 MED ORDER — OMEGA-3-ACID ETHYL ESTERS 1 G PO CAPS
1.0000 g | ORAL_CAPSULE | Freq: Two times a day (BID) | ORAL | Status: DC
  Administered 2011-08-05 – 2011-08-08 (×7): 1 g via ORAL
  Filled 2011-08-05 (×10): qty 1

## 2011-08-05 NOTE — Progress Notes (Signed)
Patient seen during d/c planning group.  She advised of attempting SI this morning and last night.  She advised she has contracted with RN not to attempt to harm herself until she has talked with someone individually but can not contract for anything beyond that. Treatment team advised of patient's comments.  She continues to endorse SI.  She advised of having severe pain that adds to her depression.  Patient upset that she is not being given medications for pain prescribed by her outpatient provider.

## 2011-08-05 NOTE — Progress Notes (Signed)
[facility] Group Notes: (Counselor/Nursing/MHT/Case Management/Adjunct) 08/05/2011   @1 :15pm  Type of Therapy:  Group Therapy  Participation Level:  Limited  Participation Quality:  Redirectable, Intrusive  Affect:  Blunted  Cognitive:  Paranoid?  Insight:  None  Engagement in Group: Minimal  Engagement in Therapy:  None  Modes of Intervention:  Support and Exploration  Summary of Progress/Problems: Cynthia Deleon came to group late and was not on topic. She interrupted others to talk about her medications, which she described as "narcotic and can knock you for a loop, and they work." Cynthia Deleon had to be redirected from complaining about the doctor and medication regimen several times. At one point, she misunderstood what another group member said and believed he was talking about Dr Dan Humphreys when he was actually talking about his professor. Cynthia Deleon got very upset, saying she was "going to have to work with that man, and I'm scared to death." It took a few minutes to assure Cynthia Deleon that the other group member was actually talking about someone outside of the hospital.   Billie Lade 08/05/2011 4:13 PM

## 2011-08-05 NOTE — Tx Team (Addendum)
Interdisciplinary Treatment Plan Update (Adult)  Date:  08/05/2011  Time Reviewed:  10:00 AM   Progress in Treatment: Attending groups: Yes, present in aftercare group this morning as well as groups over the weekend Participating in groups:  Yes, discussing concerns in group. Taking medication as prescribed:  Yes, though she reports she needs "all of" her medicines including pain killers that she cannot be prescribed here Tolerating medication: Yes Family/Significant othe contact made:  Yes, contact made with: daughter, Neysa Bonito Patient understands diagnosis: Yes Discussing patient identified problems/goals with staff:  Yes Medical problems stabilized or resolved: Yes Denies suicidal/homicidal ideation: No, reports she has attempted to kill herself already today Issues/concerns per patient self-inventory: Concerns regarding pain and not receiving pain medications as she believes she should Other:   New problem(s) identified: None  Reason for Continuation of Hospitalization: Anxiety Depression Medication stabilization Suicidal ideation  Interventions implemented related to continuation of hospitalization:  Medication stabilization, safety checks q 15 mins, group attendance  Additional comments: Can only contract for safety for short amounts of time; reports she has continued to tried to harm herself in the hospital  Estimated length of stay: 3-4 days  Discharge Plan: Family and outpatient doctor are recommending longer term hospitalization.   New goal(s):  Review of initial/current patient goals per problem list:   1.  Goal(s): Decrease depressive symptoms  Met:  No  Target date: by discharge  As evidenced by: [name] will rate depression at 4 or lower on scale of 1 to 10 (10 being high); rates at  9   today  2.  Goal (s): Decrease anxiety symptoms  Met:  No  Target date:by discharge  As evidenced by: [name] will rate anxiety at  4 or lower on scale of 1 to 10 (10 being  high); today she rates anxiety at 10  3.  Goal(s): Decrease incidence of visual hallucinations and paranoia  Met:  No  Target date: by discharge  As evidenced by: [name] will report that visual hallucinations and paranoia have returned to baseline; today is still experiencing paranoia   4.  Goal(s): Reduce  Potential for self-harm  Met:  No  Target date: by discharge  As evidenced by: [name] will deny suicidal thoughts, but today reports she is suicidal and has already tried to harm herself yesterday and today while hospitalized  Attendees: Patient:     Family:     Physician:  Dr Orson Aloe, MD 08/05/2011 10:00 AM  Nursing:   Quintella Reichert, RN 08/05/2011 10:00 AM  Case Manager:  Juline Patch, LCSW 08/05/2011 10:00 AM  Counselor:  Angus Palms, LCSW 08/05/2011 10:00 AM  Other:  Reyes Ivan, LCSWA 08/05/2011 10:00 AM  Other:  Anne Fu, RN 08/05/2011 10:00 AM  Other:     Other:      Scribe for Treatment Team:   Billie Lade, 08/05/2011 10:00 AM

## 2011-08-05 NOTE — Progress Notes (Signed)
D:  Pt attended wrap up group this evening.  She expressed peers being supportive of her by talking with her.  She said to keep door shut at night.  She said," I am paranoid if door is not placed exactly way found after checks."  A:  Encouraged pt interactions to continue with peers and in groups.  R:  Pt is safe. Aundria Rud, Beula Joyner L, MHT/NS

## 2011-08-05 NOTE — Progress Notes (Signed)
[name], female   DOB: April 06, 1956, 56 y.o.   MRN: [id number] Pt. remains guarded or at least anxious while denying lethality and A/V/H's. Pt. c/o much back pain tonight and needed help with her CPAP tonight-mask, etc. Pt. Had to be awakened at 22:55 to take her HS meds: Pt. Was also given an newly ordered dose of Nystatin S&S for her painful tongue yeast infection. Pt. took other meds and went back to sleep. 03:55 Pt. awake and asking for pain medication, "my back is killing me!"  Pt. was given oxycodone for her pain of "9"/10. 04:35 --Pt. Found by MHT, sitting at the side of her bed trying to cut her wrists using a couple of pencils. Pencils were removed: Pt. was refusing to contract for safety.  Nurse came to Pt.'s bedside to talk with her for about 20 minutes: initially she was still refusing to contract for safety, but then agreed to try to go back to sleep using her CPAP; Pt. ambulated to the bathroom to void, then returned to bed and was assisted to replace her CPAP mask on, before stating she "just want(ed) to sit here"; lights were turned off except one, leaving the door open.   Pt. also told this writer she hadn't had a BM "for about a week". Pt. Told to speak with her doctor in the morning and the message would be passed along to the day shift nurses.  04:55--Pt. was checked and found to be lying down in bed with her eyes closed, CPAP mask on: Pt. Resting quietly.

## 2011-08-05 NOTE — Progress Notes (Addendum)
Beginning of morning shift, patient was in her room, scratched her left lower arm with pencil.  MHT informed nurse.  Nurse and Asst DON talked to patient, who contracted for safety.  Charge nurse talked to patient and she contracted for safety.   Patient has contracted for safety a total of 3 times this morning with nurse.   Patient stated she is suffering emotionally and physically with back pain.  Patient has been medicated with oxycodone 15 mg this morning.  Patient has been in group this morning and in dayroom. 1300  On self inventory sheet, needs medication to help her sleep.   Has good appetite, low energy level, poor attention span.   Rated depression and hopelessness #10.  Has experienced diarrhea, swelling feet.  Stated she does have SI, contracts for safety again.  Has experienced pain.  Zero pain goal, worst pain 310.  Plans to go back to regular scheduled for medications after [facility] discharge.  Feels she needs help, feels lost, unfocused.   Plans to go home after discharge.   No problems taking meds after discharge. Feels she needs more medications, will discuss meds with doctor.  Contracts for safety again. Stated she needs her xanax tid.  Needs valium four times daily which keeps her calm, cool, collected.  Feels she needs more pain medication. Patient stated she needs her medication and not have to ask for all her meds, does not need to be flipping when she needs her medicine.   Patient stated she has vaginal yeast infection.    1930   Patient requested more pain medication, tylenol was given, and patient was seen walking in hallway talking to others.   No signs of pain/distress noted on patient's face or body movements.

## 2011-08-05 NOTE — Progress Notes (Signed)
[facility] Group Notes:  (Counselor/Nursing/MHT/Case Management/Adjunct) 11:00am   Type of Therapy:  Group Therapy  Participation Level:  Did Not Attend       Billie Lade 08/05/2011  2:49 PM

## 2011-08-05 NOTE — Progress Notes (Signed)
[facility] MD Progress Note  08/05/2011 3:25 PM  Diagnosis:  Axis I: Bipolar, mixed, Post Traumatic Stress Disorder, Substance Abuse and Substance Induced Mood Disorder  ADL's:  Intact  Sleep: Fair  Appetite:  Fair  Suicidal Ideation:  Attempted suicide this AM with a pencil.  Has had thoughts since then to attempt again. Homicidal Ideation:  Denies adamantly any homicidal thoughts.  Mental Status Examination/Evaluation: Objective:  Appearance: Casual  Eye Contact::  Fair  Speech:  Clear and Coherent  Volume:  Normal  Mood:  10 /10 on a scale of 1 is the best and 10 is the worst  Anxiety: 10/10 on the same scale  Affect:  Congruent  Thought Process:  Coherent  Orientation:  Other:  Oriented to day of the week and month of the year.  Thought Content:  Hallucinations: feeling that something is behind her  Suicidal Thoughts:  Yes.  without intent/plan  Homicidal Thoughts:  No  Memory:  Recent;   Fair  Judgement:  Impaired  Insight:  Lacking  Psychomotor Activity:  Normal  Concentration:  Fair  Recall:  Fair  Akathisia:  No  Handed:  Right  AIMS (if indicated):     Assets:  Communication Skills Desire for Improvement Financial Resources/Insurance Housing Resilience Social Support  Sleep:  Number of Hours: 5.5    Vital Signs:Blood pressure 124/77, pulse 100, temperature 96.8 F (36 C), temperature source Oral, resp. rate 16. Current Medications: Current Facility-Administered Medications  Medication Dose Route Frequency Provider Last Rate Last Dose  . acetaminophen (TYLENOL) tablet 650 mg  650 mg Oral Q6H PRN Nehemiah Settle, MD   650 mg at 08/05/11 1203  . albuterol (PROVENTIL HFA;VENTOLIN HFA) 108 (90 BASE) MCG/ACT inhaler 1 puff  1 puff Inhalation Q6H PRN Nehemiah Settle, MD   1 puff at 08/03/11 1440  . alum & mag hydroxide-simeth (MAALOX/MYLANTA) 200-200-20 MG/5ML suspension 30 mL  30 mL Oral Q4H PRN Nehemiah Settle, MD      . amitriptyline  (ELAVIL) tablet 25 mg  25 mg Oral QHS Orson Aloe, MD      . benztropine (COGENTIN) tablet 1 mg  1 mg Oral Daily Nehemiah Settle, MD   1 mg at 08/05/11 0818  . cephALEXin (KEFLEX) capsule 500 mg  500 mg Oral Q6H Nehemiah Settle, MD   500 mg at 08/05/11 1159  . chlorproMAZINE (THORAZINE) tablet 25 mg  25 mg Oral BID Nehemiah Settle, MD   25 mg at 08/05/11 0819  . chlorproMAZINE (THORAZINE) tablet 50 mg  50 mg Oral QHS Orson Aloe, MD   50 mg at 08/04/11 2259  . darifenacin (ENABLEX) 24 hr tablet 15 mg  15 mg Oral Daily Nehemiah Settle, MD   15 mg at 08/05/11 0819  . escitalopram (LEXAPRO) tablet 10 mg  10 mg Oral Daily Nehemiah Settle, MD   10 mg at 08/05/11 0820  . fluconazole (DIFLUCAN) tablet 150 mg  150 mg Oral Daily Orson Aloe, MD      . gabapentin (NEURONTIN) capsule 300 mg  300 mg Oral TID Orson Aloe, MD      . hydrochlorothiazide (MICROZIDE) capsule 12.5 mg  12.5 mg Oral Daily Nehemiah Settle, MD   12.5 mg at 08/05/11 0820  . ketoconazole (NIZORAL) 2 % shampoo   Topical 2 times weekly Nehemiah Settle, MD      . magnesium hydroxide (MILK OF MAGNESIA) suspension 30 mL  30 mL Oral Daily PRN Randal Buba  Jonnalagadda, MD      . nicotine (NICODERM CQ - dosed in mg/24 hours) patch 21 mg  21 mg Transdermal Q0600 Orson Aloe, MD   21 mg at 08/05/11 4098  . nystatin (MYCOSTATIN) 100000 UNIT/ML suspension 500,000 Units  5 mL Oral QID Orson Aloe, MD      . pantoprazole (PROTONIX) EC tablet 40 mg  40 mg Oral BID AC Nehemiah Settle, MD   40 mg at 08/05/11 1191  . promethazine (PHENERGAN) tablet 25 mg  25 mg Oral Q6H PRN Nehemiah Settle, MD   25 mg at 08/05/11 1312  . sodium chloride (OCEAN) 0.65 % nasal spray 1 spray  1 spray Each Nare PRN Mickie D. Adams, PA      . topiramate (TOPAMAX) tablet 50 mg  50 mg Oral Daily Nehemiah Settle, MD   50 mg at 08/05/11 4782  . ziprasidone (GEODON) capsule 40  mg  40 mg Oral BID WC Mickie D. Adams, PA   40 mg at 08/05/11 9562  . DISCONTD: ALPRAZolam (XANAX XR) 24 hr tablet 2 mg  2 mg Oral QHS PRN Nehemiah Settle, MD   2 mg at 08/04/11 2300  . DISCONTD: nystatin (MYCOSTATIN) 100000 UNIT/ML suspension 500,000 Units  5 mL Mouth/Throat QID Nehemiah Settle, MD   500,000 Units at 08/05/11 1159  . DISCONTD: oxyCODONE (Oxy IR/ROXICODONE) immediate release tablet 15 mg  15 mg Oral QID PRN Nehemiah Settle, MD   15 mg at 08/05/11 1308    Lab Results: No results found for this or any previous visit (from the past 48 hour(s)).  Physical Findings: AIMS:  , ,  ,  ,    CIWA:    COWS:     Treatment Plan Summary: Daily contact with patient to assess and evaluate symptoms and progress in treatment Medication management  Plan: Stop narcotic drugs which make her pain worse and cognition worse. Start Elavil, Lidoderm, Neurontin for pain and anxiety management and to prevent seizures from stopping Xanax. Shift antifungals around to what has worked for her in the past.  Dan Humphreys, Dorma Russell 08/05/2011, 3:25 PM

## 2011-08-06 DIAGNOSIS — F319 Bipolar disorder, unspecified: Secondary | ICD-10-CM

## 2011-08-06 MED ORDER — GABAPENTIN 300 MG PO CAPS
300.0000 mg | ORAL_CAPSULE | Freq: Four times a day (QID) | ORAL | Status: DC
  Administered 2011-08-06 – 2011-08-08 (×10): 300 mg via ORAL
  Filled 2011-08-06 (×16): qty 1

## 2011-08-06 MED ORDER — BIOTENE DRY MOUTH MT LIQD
15.0000 mL | OROMUCOSAL | Status: DC | PRN
  Administered 2011-08-06 – 2011-08-07 (×3): 15 mL via OROMUCOSAL
  Filled 2011-08-06: qty 15

## 2011-08-06 NOTE — Progress Notes (Addendum)
Patient's self inventory sheet, needs sleep medication, good appetite, low energy level, poor attention span.   Rated depression and hopelessness #10.  Has SI, contracts for safety.  Pain "back went out".  Worst pain #10.   Patient refused wheel chair this morning which had been put in her room last night by staff.  Nurse encouraged patient strongly to use wheelchair, but patient refused.   Patient took her meds, went to dayroom, and then while trying to get out of her chair, slid to floor and put her hands on floor.  Occurred approximately 0840.  VS BP 120/72, P107, T98.5, R18, O2 sat 95%.   Patient told student nurse who was in dayroom with her,  "I'm going back to my room.  I may hurt myself, I may stab myself with a pencil.  I'm in pain and I want people to know I'm in pain."  Patient stated "He going to be with me all day (talking about female student nurse in day room at the time".  Student nurse turned around and another patient yelled "sir, sir".  Female student nurse turned around and patient was in the floor.   Patient was transferred to wheel chair, stated her knees were red and hands.  Patient moved bilateral arms, wrist, hands, fingers, without difficulty.  Patient moved bilateral legs while in wheel chair.  No redness on knees or legs.  Patient stated she was OK to nurse.  Student nurse told nurse later that patient stated "They want me to get in that wheelchair but I'm not going to do it."   Patient has been in wheelchair this morning.   Charge nurse and doctor informed.  Patient has been seen smiling, laughing with staff and with female nurse student after fall. 1900   Patient has stated several times during the day that she does not want to be in the wheelchair, that she does not need the wheelchair.   Staff has strongly encouraged patient to continue using to the wheelchair.    Patient has been to the dining room in the wheelchair, in the hallway, and in her room using wheelchair.

## 2011-08-06 NOTE — Progress Notes (Signed)
[name], female   DOB: 01/31/1956, 56 y.o.   MRN: [id number] Pt. Up c/o severe back pain, writer administered Tylenol 650 mg and wheeled her back to room as she was finding it hard to get up and walk. Writer assisted by K. Shon Baton, Geophysicist/field seismologist. Staff will continue to monitor q69min for safety. Pt. Also used  [facility] CPAP machine  During the night as she was unable to tell us how to operate hers.
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
## 2011-08-06 NOTE — Progress Notes (Addendum)
Pt has been ordered to ambulate with a wheelchair due to high fall risk. Pt attempted to fall out of the wheelchair during day shift. Pt has expressed to another staff member that she will not use the wheelchair.Pt has been informed of the safety of ambulating with a wheel chair. Pt is currently using her feet while in the chair to stroll through the halls. This is a great option for this pt. Pt is also requesting mouthrinse for dry mouth for bedtime. This writer will deliver this pt's request. Pt is denying SI. Support and availabilty as needed has been extended to this pt. Pt safety remains with q16min checks.

## 2011-08-06 NOTE — Progress Notes (Signed)
[name], female   DOB: [birth year], 56 y.o.   MRN: [id number] Pt. Intrusive, speaking over other clients for meds. Pt. Was redirected. Pt. Denies SHI, AVH. Pt. Went to group. Staff will continue to monitor q49min for safety.

## 2011-08-06 NOTE — Progress Notes (Signed)
[name], female   DOB: [birth year], 56 y.o.   MRN: [id number] Pt noted extreme pain last night.  She said that the pain was a 50 on a scale of 1 is no pain and 10 is the worst pain.  Will order that she use the tub with classical music and candle light twice a day to help her manage the pain better.  She says that she never laughs and that when she does laugh that she doesn't think about her pain and that feels better. She notes dry mouth and will order Biotene mouth wash for her to keep her mouth fresh. She notes that third shift seems to not appreciate her plight very well and she feels more tense and more pain when they come on.  Will have Everlene Balls talk w her about that. She did not sleep well after the pain hit last night.  Her mood today is pretty rough until I challenger her to a wheelchair race with the other wheelchair patient on the unit. She said that that helped lighten her mood.  Her anxiety is real up there too.   Pt expresses a desire to hear others who have had experience with prescription drug problems, will have her attend NA meetings for that.

## 2011-08-06 NOTE — Progress Notes (Signed)
[facility] Group Notes: (Counselor/Nursing/MHT/Case Management/Adjunct) 08/06/2011   @  11:00am   Type of Therapy:  Group Therapy  Participation Level:  Limited  Participation Quality:  Attentive, Sharing  Affect:  Labile  Cognitive:  Appropriate  Insight:  Poor  Engagement in Group: Good  Engagement in Therapy:  Limited  Modes of Intervention:  Support and Exploration  Summary of Progress/Problems: Cynthia Deleon explored the way others see people with a mental illness. She talked about "always knowing something was wrong" because of the way she gets aggressive. Cynthia Deleon talked about her temper and gave examples of times when she has become violent. She seemed to give blame for this on her disorder rather than working through how to respond differently. However, she did say that she does not see herself as weak or inferior due to her diagnosis, but lives her life regularly as it does not define her. Cynthia Deleon became upset shortly after and started wailing that she needed pain medication. Nursing student escorted her to the medication room.  Billie Lade 08/06/2011 2:17 PM

## 2011-08-07 NOTE — Progress Notes (Signed)
[name], female   DOB: 1956/04/24, 56 y.o.   MRN: [id number]  Pt was anxious and agitated during our assessment.  According to staff pt awakened while the writer was doing an adm. Stated that she was going to tell her Dr that "her nurse" was not on the hall when needed.  Writer apologized to pt and explained that Clinical research associate was doing an adm.  Pt then went on to say how she disliked the staff because because everybody is mean. Citing instances from this shift as well as previous shifts. Writer explained that staff only wants what's best for her, but that some things will have to be discussed with her doctor. Writer gave pt prn tylenol for pain, and repositioned the bed. Also informed mht to give heat packs, due to pt's complaint of pain. Support and encouragement was offered.

## 2011-08-07 NOTE — Progress Notes (Signed)
[name], female   DOB: Oct 23, 1955, 56 y.o.   MRN: [id number] Pt seen in consult room where she reported that the bath last PM helped her manage her pain pretty well.  She was most upset when I came onto the hall this AM.  She explained that it was because she had to recount the conflict that she had with the night shift nurse.  She has a personality conflict with the nurse and apparently she has had some issues with those in authority over her in the past.  Will have to explore that idea later. When I saw her in the consult room she described her pain as a 8/10, her mood a 5/10 and her anxiety an 8-9/10 on a scale of 1 is the best and 10 is the worst.  She did not get her soaking bath this AM before she got the patch on.  She had hoped to be allowed to have classical music in the tub room along with electric candles to help her recapture a peaceful place and settle her pain, depression, and anxiety issues. She denies any suicidal ideas today.
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
## 2011-08-07 NOTE — Progress Notes (Signed)
[facility] Group Notes: (Counselor/Nursing/MHT/Case Management/Adjunct) 08/07/2011   @1 :15pm  Type of Therapy:  Group Therapy  Participation Level:  Minimal  Participation Quality:  Attentive, Distracted  Affect:  Blunted  Cognitive:  Appropriate  Insight:  Minimal  Engagement in Group: Minimal  Engagement in Therapy:  Minimal  Modes of Intervention:  Support and Exploration  Summary of Progress/Problems: Cynthia Deleon was not very active and did not seem able to follow the concept of complex emotions underlying main "umbrella" emotions. She struggled to identify feelings and often just fell quiet. However, she was able to identify the importance of dealing with an issue straight on rather than allowing it to "rot" and cause consequences in other areas of one's life.   Billie Lade 08/07/2011 3:32 PM

## 2011-08-07 NOTE — Progress Notes (Signed)
[name], female   DOB: 1956/01/13, 56 y.o.   MRN: [id number]   Patient has a flat affect on approach but did brighten up some when speaking with her. Patient needy this am and wants to speak with physician as soon as he arrives. Appears anxious at times. Stated that she was having some problems at home and became stressed out. She says she had some suicidal thoughts. Currently denies any SI and gives her depression "2" on scale and hopelessness "1". Staff will continue to monitor and encourage group attendance.

## 2011-08-07 NOTE — Progress Notes (Signed)
[facility] Group Notes:  (Counselor/Nursing/MHT/Case Management/Adjunct)  08/07/2011 2:23 PM  Type of Therapy:  Group Therapy  Participation Level:  Active  Participation Quality:  Appropriate, Attentive, Sharing and Supportive  Affect:  Appropriate  Cognitive:  Alert and Appropriate  Insight:  Good  Engagement in Group:  Good  Engagement in Therapy:  Good  Modes of Intervention:  Education, Support and Exploration  Summary of Progress/Problems: Patient reported often experiencing feelings of anger. Patient stated she has 5 children and her youngest son is beginning college soon. Patient reported the achievements of her son give her a sense of happiness and gratification. Patient also reported that she enjoys taking walks and cooking. Patient was supportive of her peers.     Wilmon Arms 08/07/2011, 2:23 PM  Cosigned by: Angus Palms, LCSW

## 2011-08-08 MED ORDER — OMEGA-3-ACID ETHYL ESTERS 1 G PO CAPS: 1.0000 g | ORAL_CAPSULE | Freq: Two times a day (BID) | ORAL | Status: DC

## 2011-08-08 MED ORDER — KETOCONAZOLE 2 % EX CREA: 1.0000 "application " | TOPICAL_CREAM | Freq: Every day | CUTANEOUS | Status: DC

## 2011-08-08 MED ORDER — NYSTATIN 100000 UNIT/ML MT SUSP: 5.0000 mL | Freq: Four times a day (QID) | OROMUCOSAL | Status: AC

## 2011-08-08 MED ORDER — KETOCONAZOLE 2 % EX SHAM: 1.0000 "application " | MEDICATED_SHAMPOO | CUTANEOUS | Status: DC

## 2011-08-08 MED ORDER — AMITRIPTYLINE HCL 25 MG PO TABS: ORAL_TABLET | ORAL | Status: DC

## 2011-08-08 MED ORDER — TOPIRAMATE 50 MG PO TABS: 50.0000 mg | ORAL_TABLET | Freq: Every day | ORAL | Status: DC

## 2011-08-08 MED ORDER — LIDOCAINE 5 % EX PTCH: 1.0000 | MEDICATED_PATCH | CUTANEOUS | Status: AC

## 2011-08-08 MED ORDER — DARIFENACIN HYDROBROMIDE ER 15 MG PO TB24: 15.0000 mg | ORAL_TABLET | Freq: Every day | ORAL | Status: DC

## 2011-08-08 MED ORDER — FLUCONAZOLE 150 MG PO TABS: 150.0000 mg | ORAL_TABLET | Freq: Every day | ORAL | Status: AC

## 2011-08-08 MED ORDER — GABAPENTIN 300 MG PO CAPS: 300.0000 mg | ORAL_CAPSULE | Freq: Four times a day (QID) | ORAL | Status: DC

## 2011-08-08 MED ORDER — CHLORPROMAZINE HCL 25 MG PO TABS: ORAL_TABLET | ORAL | Status: DC

## 2011-08-08 MED ORDER — CEPHALEXIN 500 MG PO CAPS: 500.0000 mg | ORAL_CAPSULE | Freq: Four times a day (QID) | ORAL | Status: DC

## 2011-08-08 MED ORDER — ESOMEPRAZOLE MAGNESIUM 40 MG PO CPDR: 40.0000 mg | DELAYED_RELEASE_CAPSULE | Freq: Two times a day (BID) | ORAL | Status: DC

## 2011-08-08 MED ORDER — TRAZODONE HCL 100 MG PO TABS: 100.0000 mg | ORAL_TABLET | Freq: Every day | ORAL | Status: DC

## 2011-08-08 MED ORDER — SOLIFENACIN SUCCINATE 10 MG PO TABS: 10.0000 mg | ORAL_TABLET | Freq: Every day | ORAL | Status: DC

## 2011-08-08 MED ORDER — ESCITALOPRAM OXALATE 10 MG PO TABS: 10.0000 mg | ORAL_TABLET | Freq: Every day | ORAL | Status: DC

## 2011-08-08 MED ORDER — HYDROCHLOROTHIAZIDE 12.5 MG PO CAPS: 12.5000 mg | ORAL_CAPSULE | Freq: Every day | ORAL | Status: DC

## 2011-08-08 MED ORDER — ZIPRASIDONE HCL 20 MG PO CAPS: 40.0000 mg | ORAL_CAPSULE | Freq: Two times a day (BID) | ORAL | Status: DC

## 2011-08-08 NOTE — Tx Team (Signed)
Interdisciplinary Treatment Plan Update (Adult)  Date:  08/08/2011  Time Reviewed:  10:19 AM   Progress in Treatment: Attending groups: Yes Participating in groups:  Yes Taking medication as prescribed:  Yes Tolerating medication: Yes Family/Significant othe contact made:  Yes, contact made with: daughter Patient understands diagnosis: Yes, however she reports pain is the main problem  Discussing patient identified problems/goals with staff:  Yes Medical problems stabilized or resolved: Yes Denies suicidal/homicidal ideation: Yes Issues/concerns per patient self-inventory:  No  Other:  New problem(s) identified: None  Reason for Continuation of Hospitalization: Appropriate for discharge today  Interventions implemented related to continuation of hospitalization:  Medication stabilization, safety checks q 15 mins, group attendance  Additional comments:  Estimated length of stay: discharge today  Discharge Plan: Discharge home, follow up with Dr Tiburcio Pea in [place], [place]  New goal(s):  Review of initial/current patient goals per problem list:  1. Goal(s): Decrease depressive symptoms  Met: Yes Target date: by discharge  As evidenced by: Cynthia Deleon will rate depression at 4 or lower on scale of 1 to 10 (10 being high); rates at today 0  when  speaking with doctor 2. Goal (s): Decrease anxiety symptoms  Met: Yes   Target date:by discharge  As evidenced by: Cynthia Deleon will rate anxiety at 4 or lower on scale of 1 to 10 (10 being high); today she rates  anxiety at 4, though angry at Dr Dan Humphreys 3. Goal(s): Decrease incidence of visual hallucinations and paranoia  Met: Yes Target date: by discharge  As evidenced by: Cynthia Deleon reports no hallucinations and does not present as paranoid  4. Goal(s): Reduce Potential for self-harm  Met: Yes Target date: by discharge  As evidenced by: Cynthia Deleon denies suicidal thoughts today   Attendees: Patient:  Cynthia Deleon 08/08/2011 10:19 AM  Family:    08/08/2011 10:19 AM  Physician:  Dr Orson Aloe, MD 08/08/2011 10:19 AM  Nursing:   Quintella Reichert, RN 08/08/2011 10:19 AM  Case Manager:  Juline Patch, LCSW 08/08/2011 10:19 AM  Counselor:  Angus Palms, LCSW 08/08/2011 10:19 AM  Other:  Reyes Ivan, LCSWA 08/08/2011 10:19 AM  Other:  Wilmon Arms, counseling intern 08/08/2011 10:19 AM  Other:   08/08/2011 10:19 AM  Other:   08/08/2011 10:19 AM   Scribe for Treatment Team:   Billie Lade, 08/08/2011 10:19 AM

## 2011-08-08 NOTE — Progress Notes (Signed)
[facility] Case Management Discharge Plan:  Will you be returning to the same living situation after discharge: Yes,  Patient will return to the home she shares with her son At discharge, do you have transportation home?:Yes,  Patient will arrange for family to transport her home Do you have the ability to pay for your medications:Yes,  Patient will need assistance with medications  Interagency Information:     Release of information consent forms completed and in the chart;  Patient's signature needed at discharge.  Patient to Follow up at:    Patient denies SI/HI:   Yes,  Patient denies SI/HI    Safety Planning and Suicide Prevention discussed:  Yes,  Reviewed during aftercare group  Barrier to discharge identified:No.  Summary and Recommendations:  Patient to discharge to her home and follow up with Dr. Tiburcio Pea in [facility], [place] 08/08/2011, 11:06 AM

## 2011-08-08 NOTE — Progress Notes (Addendum)
Discharge Note:   Patient received all discharge instructions.   Suicide prevention information given to patient, discussed with patient, who stated she understands and has no questions.   Denied SI and HI.   Denied A/V hallucinations.   Denied pain.  Patient stated she received all her belongings, cpap.  Stated she appreciated all the staff has done to assist her while at [facility].

## 2011-08-08 NOTE — Discharge Instructions (Signed)
Stay off OXY, Valium, Xanax and other addictive substances.

## 2011-08-08 NOTE — Progress Notes (Signed)
[name], female   DOB: [birth year], 56 y.o.   MRN: [id number]  Pt appears to be less anxious and irritable than previous days.  Informed the writer that she will be glad to stop using the walker. Stated that she was informed by the Dr had written an order that she can take a bath. Writer informed the pt that all pt's have access to the bathtub, and an order wasn't needed.  Asked pt about attending groups on [address]. Pt stated that she thought that the pt's would be facilitating the group, rather than AA/NA members. Support and encouragement was offered.

## 2011-08-08 NOTE — Progress Notes (Signed)
Patient's self inventory sheet, patient has poor sleep, poor appetite, low energy level, poor attention span.   Rated depression and hopelessness #10.  Denied SI.  Has experienced pain in back, worst pain #8.  Plans to get her routine back in schedule after discharge to her home.  Stated "I love'em, good morning to all"  About staff at [facility].  Does have discharge plans, no problems staying on meds after discharge.

## 2011-08-08 NOTE — Progress Notes (Signed)
[facility] Group Notes:  (Counselor/Nursing/MHT/Case Management/Adjunct)  08/08/2011 3:06 PM  Type of Therapy:  1:15PM Group Therapy  Participation Level:  Active  Participation Quality:  Appropriate and Attentive  Affect:  Appropriate  Cognitive:  Alert and Appropriate  Insight:  Good  Engagement in Group:  Good  Engagement in Therapy:  Good  Modes of Intervention:  Activity, Education and Exploration  Summary of Progress/Problems: Patient reported she is able to relate to having a "soul mate" as away to create balance in life. Patient stated she believes having a significant other in her life that understand her needs and are able to meet those needs help to create balance in her life. She reported the relationship with her spouse/significant other is somewhat a balanced relationship, because "he does not speak to her in vulgar language". Patient seems as if she was able to relate to her peer in discussing ways to create a balanced life.   Wilmon Arms 08/08/2011, 3:06 PM  Cosigned by: Angus Palms, LCSW

## 2011-08-08 NOTE — Progress Notes (Signed)
[facility] Group Notes:  (Counselor/Nursing/MHT/Case Management/Adjunct)  08/08/2011 12:35 PM  Type of Therapy:  Group Therapy  Participation Level:  Did Not Attend  STEVENSON, SHALETA 08/08/2011, 12:35 PM Cosigned by: Regina Alexander, LCSW  

## 2011-08-08 NOTE — [facility] Suicide Risk Assessment (Signed)
Suicide Risk Assessment  Discharge Assessment     Demographic factors:  Assessment Details Time of Assessment: Admission Information Obtained From: Patient Current Mental Status:  Current Mental Status: Suicidal ideation indicated by patient Risk Reduction Factors:  Risk Reduction Factors: Living with another person, especially a relative;Positive social support  CLINICAL FACTORS:   Severe Anxiety and/or Agitation Bipolar Disorder:   Bipolar II Obsessive-Compulsive Disorder Chronic Pain Previous Psychiatric Diagnoses and Treatments Medical Diagnoses and Treatments/Surgeries  COGNITIVE FEATURES THAT CONTRIBUTE TO RISK:  Closed-mindedness Thought constriction (tunnel vision)    SUICIDE RISK:   Minimal: No identifiable suicidal ideation.  Patients presenting with no risk factors but with morbid ruminations; may be classified as minimal risk based on the severity of the depressive symptoms  ADL's:  Intact  Sleep: Good  Appetite:  Good  Suicidal Ideation:  Denies adamantly any suicidal thoughts. Homicidal Ideation:  Denies adamantly any homicidal thoughts.  Mental Status Examination/Evaluation: Objective:  Appearance: Casual  Eye Contact::  Good  Speech:  Clear and Coherent  Volume:  Normal  Mood:  Euthymic  Affect:  Congruent  Thought Process:  Coherent  Orientation:  Full  Thought Content:  WDL  Suicidal Thoughts:  No  Homicidal Thoughts:  No  Memory:  Immediate;   Good  Judgement:  Good  Insight:  Good  Psychomotor Activity:  Normal  Concentration:  Good  Recall:  Good  Akathisia:  No  AIMS (if indicated):     Assets:  Communication Skills Desire for Improvement Financial Resources/Insurance Housing Intimacy Leisure Time Social Support Talents/Skills Transportation  Sleep: Number of Hours: 5.25    Vital Signs: Blood pressure 107/72, pulse 102, temperature 97.3 F (36.3 C), temperature source Oral, resp. rate 16.  Labs No results found for this or  any previous visit (from the past 48 hour(s)).  What pt has learned from hospital stay is how to not commit suicide  Risk of self harm is elevated by her diagnosis of bipolar disorder, but she has vowed that she will be getting back into church and loking forward to the rest of her life.  Risk of harm to others is minimal in that she has not been involved in fights or had any legal charges filed on her.  PLAN: Discharge home Continue Medication List  As of 08/08/2011  9:51 AM   STOP taking these medications         alprazolam 2 MG tablet      benztropine 1 MG tablet      diazepam 10 MG tablet      oxyCODONE 15 MG Tb12      promethazine 25 MG tablet         TAKE these medications         albuterol 108 (90 BASE) MCG/ACT inhaler   Commonly known as: PROVENTIL HFA;VENTOLIN HFA   Inhale 1 puff into the lungs every 6 (six) hours as needed. For shortness of breath.      amitriptyline 25 MG tablet   Commonly known as: ELAVIL   Take by mouth one to two every night at bed time for management of depression, anxiety, and pain      cephALEXin 500 MG capsule   Commonly known as: KEFLEX   Take 1 capsule (500 mg total) by mouth 4 (four) times daily. For UTI, take until gone      chlorproMAZINE 25 MG tablet   Commonly known as: THORAZINE   Take by mouth 3 times a day for anxiety.  darifenacin 15 MG 24 hr tablet   Commonly known as: ENABLEX   Take 1 tablet (15 mg total) by mouth daily. For overactive bladder      escitalopram 10 MG tablet   Commonly known as: LEXAPRO   Take 1 tablet (10 mg total) by mouth daily. For depresion.      esomeprazole 40 MG capsule   Commonly known as: NEXIUM   Take 1 capsule (40 mg total) by mouth 2 (two) times daily. For control of stomach acid secretion and helps GERD.      fluconazole 150 MG tablet   Commonly known as: DIFLUCAN   Take 1 tablet (150 mg total) by mouth daily. For vaginal infection.      gabapentin 300 MG capsule   Commonly  known as: NEURONTIN   Take 1 capsule (300 mg total) by mouth 4 (four) times daily. For anxiety      hydrochlorothiazide 12.5 MG capsule   Commonly known as: MICROZIDE   Take 1 capsule (12.5 mg total) by mouth daily. For edema.      ketoconazole 2 % shampoo   Commonly known as: NIZORAL   Apply 1 application topically 2 (two) times a week. For scalp condition      ketoconazole 2 % cream   Commonly known as: NIZORAL   Apply 1 application topically daily. For scalp condition      lidocaine 5 %   Commonly known as: LIDODERM   Place 1 patch onto the skin daily. Remove & Discard patch within 12 hours or as directed by MD. For management of pain.      nystatin 100000 UNIT/ML suspension   Commonly known as: MYCOSTATIN   Take 5 mLs (500,000 Units total) by mouth 4 (four) times daily. For mouth infection      omega-3 acid ethyl esters 1 G capsule   Commonly known as: LOVAZA   Take 1 capsule (1 g total) by mouth 2 (two) times daily. For depression and help lower cholesterol.      solifenacin 10 MG tablet   Commonly known as: VESICARE   Take 1 tablet (10 mg total) by mouth daily. For overactive bladder      topiramate 50 MG tablet   Commonly known as: TOPAMAX   Take 1 tablet (50 mg total) by mouth daily. For bipolar mood disorder      traZODone 100 MG tablet   Commonly known as: DESYREL   Take 1 tablet (100 mg total) by mouth at bedtime. For insomnia.      ziprasidone 20 MG capsule   Commonly known as: GEODON   Take 2 capsules (40 mg total) by mouth 2 (two) times daily with a meal. For bipolar mood disroder           Sarrah Fiorenza 08/08/2011, 9:50 AM
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
## 2011-08-12 NOTE — Progress Notes (Signed)
Patient Discharge Instructions:  Psychiatric Admission Assessment Note Faxed,  08/12/2011 Discharge Summary Note Faxed,   08/12/2011 After Visit Summary (AVS) Faxed,  08/12/2011 Face Sheet Faxed, 08/12/2011 Faxed to the Next Level Care provider:  08/12/2011  Faxed to Dr. Carroll Sage @ [phone], Eduard Clos, 08/12/2011, 4:47 PM

## 2011-09-02 ENCOUNTER — Encounter (HOSPITAL_COMMUNITY): Payer: Self-pay | Admitting: *Deleted

## 2011-09-02 ENCOUNTER — Emergency Department (HOSPITAL_COMMUNITY)
Admission: EM | Admit: 2011-09-02 | Discharge: 2011-09-02 | Disposition: A | Discharge status: Home / Self Care | Payer: Medicaid Other | Attending: Emergency Medicine | Admitting: Emergency Medicine

## 2011-09-02 DIAGNOSIS — F319 Bipolar disorder, unspecified: Secondary | ICD-10-CM | POA: Insufficient documentation

## 2011-09-02 DIAGNOSIS — B029 Zoster without complications: Secondary | ICD-10-CM | POA: Insufficient documentation

## 2011-09-02 DIAGNOSIS — Z79899 Other long term (current) drug therapy: Secondary | ICD-10-CM | POA: Insufficient documentation

## 2011-09-02 MED ORDER — OXYCODONE-ACETAMINOPHEN 5-325 MG PO TABS
1.0000 | ORAL_TABLET | Freq: Once | ORAL | Status: AC
  Administered 2011-09-02: 1 via ORAL
  Filled 2011-09-02: qty 1

## 2011-09-02 MED ORDER — ACYCLOVIR 400 MG PO TABS: 400.0000 mg | ORAL_TABLET | Freq: Four times a day (QID) | ORAL | Status: AC

## 2011-09-02 MED ORDER — OXYCODONE-ACETAMINOPHEN 5-325 MG PO TABS: 1.0000 | ORAL_TABLET | ORAL | Status: AC | PRN

## 2011-09-02 NOTE — ED Provider Notes (Signed)
History     CSN: [id number]  Arrival date & time 09/02/11  1724   First MD Initiated Contact with Patient 09/02/11 1813      Chief Complaint  Patient presents with  . Abscess    (Consider location/radiation/quality/duration/timing/severity/associated sxs/prior treatment) HPI Pt present with painful rash to R buttock x 1 day. Rash is serious of clustered pustules which pt thinks is shingles. No prior outbreaks of shingles. No systemic symptoms Past Medical History  Diagnosis Date  . Bipolar 1 disorder Degenerative disk disease  . Arthritis     hands and knees    Past Surgical History  Procedure Date  . Abdominal hysterectomy   . Tonsillectomy     No family history on file.  History  Substance Use Topics  . Smoking status: Current Everyday Smoker -- 0.5 packs/day    Types: Cigarettes  . Smokeless tobacco: Not on file  . Alcohol Use: Yes     rarely    OB History    Grav Para Term Preterm Abortions TAB SAB Ect Mult Living                  Review of Systems  Constitutional: Negative for fever and chills.  Gastrointestinal: Negative for abdominal pain.  Skin: Positive for rash.    Allergies  Codeine; Depakote; and Sulfur  Home Medications   Current Outpatient Rx  Name Route Sig Dispense Refill  . ACYCLOVIR 400 MG PO TABS Oral Take 1 tablet (400 mg total) by mouth 4 (four) times daily. 40 tablet 0  . ALBUTEROL SULFATE HFA 108 (90 BASE) MCG/ACT IN AERS Inhalation Inhale 1 puff into the lungs every 6 (six) hours as needed. For shortness of breath.    . AMITRIPTYLINE HCL 25 MG PO TABS  Take by mouth one to two every night at bed time for management of depression, anxiety, and pain 50 tablet 0  . CEPHALEXIN 500 MG PO CAPS Oral Take 1 capsule (500 mg total) by mouth 4 (four) times daily. For UTI, take until gone 24 capsule 0  . CHLORPROMAZINE HCL 25 MG PO TABS  Take by mouth 3 times a day for anxiety. 90 tablet 0  . DARIFENACIN HYDROBROMIDE ER 15 MG PO TB24 Oral  Take 1 tablet (15 mg total) by mouth daily. For overactive bladder 30 tablet 0  . ESCITALOPRAM OXALATE 10 MG PO TABS Oral Take 1 tablet (10 mg total) by mouth daily. For depresion. 30 tablet 0  . ESOMEPRAZOLE MAGNESIUM 40 MG PO CPDR Oral Take 1 capsule (40 mg total) by mouth 2 (two) times daily. For control of stomach acid secretion and helps GERD. 60 capsule 0  . GABAPENTIN 300 MG PO CAPS Oral Take 1 capsule (300 mg total) by mouth 4 (four) times daily. For anxiety 120 capsule 0  . HYDROCHLOROTHIAZIDE 12.5 MG PO CAPS Oral Take 1 capsule (12.5 mg total) by mouth daily. For edema. 30 capsule 0  . KETOCONAZOLE 2 % EX CREA Topical Apply 1 application topically daily. For scalp condition 15 g 0  . KETOCONAZOLE 2 % EX SHAM Topical Apply 1 application topically 2 (two) times a week. For scalp condition 120 mL 0  . LIDOCAINE 5 % EX PTCH Transdermal Place 1 patch onto the skin daily. Remove & Discard patch within 12 hours or as directed by MD. For management of pain. 30 patch 0  . OMEGA-3-ACID ETHYL ESTERS 1 G PO CAPS Oral Take 1 capsule (1 g total) by mouth 2 (  two) times daily. For depression and help lower cholesterol. 60 capsule 0  . OXYCODONE-ACETAMINOPHEN 5-325 MG PO TABS Oral Take 1 tablet by mouth every 4 (four) hours as needed for pain. 15 tablet 0  . SOLIFENACIN SUCCINATE 10 MG PO TABS Oral Take 1 tablet (10 mg total) by mouth daily. For overactive bladder 30 tablet 0  . TOPIRAMATE 50 MG PO TABS Oral Take 1 tablet (50 mg total) by mouth daily. For bipolar mood disorder 30 tablet 0  . TRAZODONE HCL 100 MG PO TABS Oral Take 1 tablet (100 mg total) by mouth at bedtime. For insomnia. 30 tablet 0  . ZIPRASIDONE HCL 20 MG PO CAPS Oral Take 2 capsules (40 mg total) by mouth 2 (two) times daily with a meal. For bipolar mood disroder 120 capsule 0    BP 136/69  Pulse 113  Temp(Src) 98 F (36.7 C) (Oral)  Resp 20  Ht 5\' 9"  (1.753 m)  Wt 170 lb (77.111 kg)  BMI 25.10 kg/m2  SpO2 97%  Physical Exam    Nursing note and vitals reviewed. Constitutional: She appears well-developed and well-nourished. She appears distressed (mild distress).  HENT:  Head: Normocephalic and atraumatic.  Neck: Normal range of motion. Neck supple.  Pulmonary/Chest: Effort normal.  Abdominal: Soft.       R buttock with 3 clusters of pustules with mild surrounding erythema. No evidence of cellulitis or abscess.   Musculoskeletal: Normal range of motion.  Neurological: She is alert.       Ambulating without difficulty  Skin: Rash noted.    ED Course  Procedures (including critical care time)  Labs Reviewed - No data to display No results found.   1. Shingles rash       MDM  Will treat as shingle outbreak. F/u with PMD        Loren Racer, MD 09/02/11 [phone]

## 2011-09-02 NOTE — ED Notes (Signed)
Pt presents to er with 3 small red separate areas on buttock area that pt states that they are shingles, pt states that the areas started yesterday

## 2011-09-02 NOTE — Discharge Instructions (Signed)

## 2012-11-18 ENCOUNTER — Emergency Department (HOSPITAL_COMMUNITY)
Admission: EM | Admit: 2012-11-18 | Discharge: 2012-11-19 | Disposition: A | Discharge status: Other Acute Inpatient Hospital | Payer: MEDICAID | Source: Home / Self Care | Attending: Emergency Medicine | Admitting: Emergency Medicine

## 2012-11-18 ENCOUNTER — Encounter (HOSPITAL_COMMUNITY): Payer: Self-pay | Admitting: *Deleted

## 2012-11-18 DIAGNOSIS — M542 Cervicalgia: Secondary | ICD-10-CM | POA: Insufficient documentation

## 2012-11-18 DIAGNOSIS — Z87448 Personal history of other diseases of urinary system: Secondary | ICD-10-CM | POA: Insufficient documentation

## 2012-11-18 DIAGNOSIS — F319 Bipolar disorder, unspecified: Secondary | ICD-10-CM | POA: Insufficient documentation

## 2012-11-18 DIAGNOSIS — F410 Panic disorder [episodic paroxysmal anxiety] without agoraphobia: Secondary | ICD-10-CM | POA: Insufficient documentation

## 2012-11-18 DIAGNOSIS — Z79899 Other long term (current) drug therapy: Secondary | ICD-10-CM | POA: Insufficient documentation

## 2012-11-18 DIAGNOSIS — I10 Essential (primary) hypertension: Secondary | ICD-10-CM | POA: Diagnosis present

## 2012-11-18 DIAGNOSIS — Z8659 Personal history of other mental and behavioral disorders: Secondary | ICD-10-CM | POA: Insufficient documentation

## 2012-11-18 DIAGNOSIS — M549 Dorsalgia, unspecified: Secondary | ICD-10-CM | POA: Diagnosis present

## 2012-11-18 DIAGNOSIS — G8929 Other chronic pain: Secondary | ICD-10-CM | POA: Diagnosis present

## 2012-11-18 DIAGNOSIS — R45851 Suicidal ideations: Secondary | ICD-10-CM | POA: Insufficient documentation

## 2012-11-18 DIAGNOSIS — F17200 Nicotine dependence, unspecified, uncomplicated: Secondary | ICD-10-CM | POA: Insufficient documentation

## 2012-11-18 DIAGNOSIS — M129 Arthropathy, unspecified: Secondary | ICD-10-CM | POA: Insufficient documentation

## 2012-11-18 DIAGNOSIS — F4310 Post-traumatic stress disorder, unspecified: Secondary | ICD-10-CM | POA: Diagnosis present

## 2012-11-18 DIAGNOSIS — IMO0002 Reserved for concepts with insufficient information to code with codable children: Secondary | ICD-10-CM

## 2012-11-18 DIAGNOSIS — F4481 Dissociative identity disorder: Secondary | ICD-10-CM | POA: Diagnosis present

## 2012-11-18 DIAGNOSIS — F29 Unspecified psychosis not due to a substance or known physiological condition: Secondary | ICD-10-CM | POA: Insufficient documentation

## 2012-11-18 DIAGNOSIS — F3160 Bipolar disorder, current episode mixed, unspecified: Principal | ICD-10-CM | POA: Diagnosis present

## 2012-11-18 HISTORY — DX: Dorsalgia, unspecified: M54.9

## 2012-11-18 HISTORY — DX: Unspecified urinary incontinence: R32

## 2012-11-18 HISTORY — DX: Essential (primary) hypertension: I10

## 2012-11-18 HISTORY — DX: Cervicalgia: M54.2

## 2012-11-18 HISTORY — DX: Dissociative identity disorder: F44.81

## 2012-11-18 HISTORY — DX: Other chronic pain: G89.29

## 2012-11-18 HISTORY — DX: Panic disorder (episodic paroxysmal anxiety): F41.0

## 2012-11-18 LAB — CBC WITH DIFFERENTIAL/PLATELET
Eosinophils Absolute: 0.1 10*3/uL (ref 0.0–0.7)
HCT: 47.9 % — ABNORMAL HIGH (ref 36.0–46.0)
Hemoglobin: 15.9 g/dL — ABNORMAL HIGH (ref 12.0–15.0)
Lymphs Abs: 2.4 10*3/uL (ref 0.7–4.0)
MCH: 31.4 pg (ref 26.0–34.0)
MCV: 94.7 fL (ref 78.0–100.0)
Monocytes Absolute: 0.4 10*3/uL (ref 0.1–1.0)
Monocytes Relative: 4 % (ref 3–12)
Neutrophils Relative %: 73 % (ref 43–77)
RBC: 5.06 MIL/uL (ref 3.87–5.11)

## 2012-11-18 LAB — RAPID URINE DRUG SCREEN, HOSP PERFORMED
Amphetamines: NOT DETECTED
Barbiturates: NOT DETECTED
Benzodiazepines: NOT DETECTED

## 2012-11-18 LAB — BASIC METABOLIC PANEL
BUN: 10 mg/dL (ref 6–23)
Creatinine, Ser: 0.84 mg/dL (ref 0.50–1.10)
GFR calc non Af Amer: 76 mL/min — ABNORMAL LOW (ref >90)
Glucose, Bld: 117 mg/dL — ABNORMAL HIGH (ref 70–99)
Potassium: 4.2 mEq/L (ref 3.5–5.1)

## 2012-11-18 LAB — ETHANOL: Alcohol, Ethyl (B): <11 mg/dL (ref 0–11)

## 2012-11-18 MED ORDER — NICOTINE 21 MG/24HR TD PT24: 21.0000 mg | MEDICATED_PATCH | Freq: Every day | TRANSDERMAL | Status: DC | PRN

## 2012-11-18 MED ORDER — IBUPROFEN 400 MG PO TABS: 400.0000 mg | ORAL_TABLET | Freq: Three times a day (TID) | ORAL | Status: DC | PRN

## 2012-11-18 MED ORDER — HALOPERIDOL 5 MG PO TABS
5.0000 mg | ORAL_TABLET | Freq: Once | ORAL | Status: AC
  Administered 2012-11-18: 5 mg via ORAL
  Filled 2012-11-18: qty 1

## 2012-11-18 MED ORDER — ALPRAZOLAM 0.5 MG PO TABS
1.0000 mg | ORAL_TABLET | Freq: Every day | ORAL | Status: DC | PRN
  Administered 2012-11-19 (×2): 1 mg via ORAL
  Filled 2012-11-18 (×2): qty 2

## 2012-11-18 MED ORDER — ARIPIPRAZOLE 10 MG PO TABS
10.0000 mg | ORAL_TABLET | Freq: Every day | ORAL | Status: DC
  Administered 2012-11-19: 10 mg via ORAL
  Filled 2012-11-18 (×2): qty 1

## 2012-11-18 MED ORDER — ALUM & MAG HYDROXIDE-SIMETH 200-200-20 MG/5ML PO SUSP: 30.0000 mL | ORAL | Status: DC | PRN

## 2012-11-18 MED ORDER — TRAZODONE HCL 50 MG PO TABS
300.0000 mg | ORAL_TABLET | Freq: Every day | ORAL | Status: DC
  Administered 2012-11-18: 300 mg via ORAL
  Filled 2012-11-18 (×2): qty 6

## 2012-11-18 MED ORDER — FLUOXETINE HCL 20 MG PO CAPS
20.0000 mg | ORAL_CAPSULE | Freq: Every day | ORAL | Status: DC
  Administered 2012-11-19: 20 mg via ORAL
  Filled 2012-11-18 (×2): qty 1

## 2012-11-18 MED ORDER — PANTOPRAZOLE SODIUM 40 MG PO TBEC
40.0000 mg | DELAYED_RELEASE_TABLET | Freq: Every day | ORAL | Status: DC
  Administered 2012-11-18 – 2012-11-19 (×2): 40 mg via ORAL
  Filled 2012-11-18 (×2): qty 1

## 2012-11-18 MED ORDER — ACETAMINOPHEN 325 MG PO TABS: 650.0000 mg | ORAL_TABLET | ORAL | Status: DC | PRN

## 2012-11-18 MED ORDER — ACETAMINOPHEN 500 MG PO TABS
ORAL_TABLET | ORAL | Status: AC
  Administered 2012-11-18: 1000 mg
  Filled 2012-11-18: qty 2

## 2012-11-18 MED ORDER — GABAPENTIN 300 MG PO CAPS
ORAL_CAPSULE | ORAL | Status: AC
  Filled 2012-11-18: qty 1

## 2012-11-18 MED ORDER — TRAZODONE HCL 50 MG PO TABS
ORAL_TABLET | ORAL | Status: AC
  Filled 2012-11-18: qty 6

## 2012-11-18 MED ORDER — ONDANSETRON HCL 4 MG PO TABS
4.0000 mg | ORAL_TABLET | Freq: Three times a day (TID) | ORAL | Status: DC | PRN
Start: 2012-11-18 — End: 2012-11-19

## 2012-11-18 MED ORDER — DARIFENACIN HYDROBROMIDE ER 7.5 MG PO TB24
15.0000 mg | ORAL_TABLET | Freq: Every day | ORAL | Status: DC
  Administered 2012-11-19: 15 mg via ORAL
  Filled 2012-11-18 (×2): qty 1

## 2012-11-18 MED ORDER — ATORVASTATIN CALCIUM 20 MG PO TABS
20.0000 mg | ORAL_TABLET | Freq: Every day | ORAL | Status: DC
  Administered 2012-11-19: 20 mg via ORAL
  Filled 2012-11-18 (×2): qty 2

## 2012-11-18 MED ORDER — GABAPENTIN 300 MG PO CAPS
300.0000 mg | ORAL_CAPSULE | Freq: Every day | ORAL | Status: DC
  Administered 2012-11-18: 300 mg via ORAL
  Filled 2012-11-18 (×2): qty 1

## 2012-11-18 NOTE — ED Notes (Signed)
Pt alert and at this time, cooperative.  Pt asking about home medications.  Explained to pt that we're waiting on verification by physician prior to medications being reordered.  Pt denies any concerns at this time, other than getting medications.  [facility] at bedside.
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
## 2012-11-18 NOTE — ED Notes (Signed)
Spoke with pharmacy tech who will come talk to pt and verify medications prior to being reordered.

## 2012-11-18 NOTE — ED Notes (Signed)
Patient given a new pair of scrub pants at this time.

## 2012-11-18 NOTE — ED Notes (Signed)
Pt sent here via EMS per [facility]. Pt was initially voluntary but then became combative. Pt has split personalities. "When Cookie comes out, pt is uncooperative and combative" [facility] at bedside.
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
## 2012-11-19 ENCOUNTER — Encounter (HOSPITAL_COMMUNITY): Payer: Self-pay | Admitting: Emergency Medicine

## 2012-11-19 ENCOUNTER — Inpatient Hospital Stay (HOSPITAL_COMMUNITY)
Admission: AD | Admit: 2012-11-19 | Discharge: 2012-11-25 | DRG: 885 | Disposition: A | Discharge status: Home / Self Care | Payer: MEDICAID | Source: Intra-hospital | Attending: Psychiatry | Admitting: Psychiatry

## 2012-11-19 DIAGNOSIS — F319 Bipolar disorder, unspecified: Secondary | ICD-10-CM

## 2012-11-19 DIAGNOSIS — F3164 Bipolar disorder, current episode mixed, severe, with psychotic features: Secondary | ICD-10-CM | POA: Diagnosis present

## 2012-11-19 DIAGNOSIS — F4310 Post-traumatic stress disorder, unspecified: Secondary | ICD-10-CM | POA: Diagnosis present

## 2012-11-19 HISTORY — DX: Major depressive disorder, single episode, unspecified: F32.9

## 2012-11-19 HISTORY — DX: Depression, unspecified: F32.A

## 2012-11-19 MED ORDER — MAGNESIUM HYDROXIDE 400 MG/5ML PO SUSP: 30.0000 mL | Freq: Every day | ORAL | Status: DC | PRN

## 2012-11-19 MED ORDER — ACETAMINOPHEN 325 MG PO TABS
650.0000 mg | ORAL_TABLET | Freq: Four times a day (QID) | ORAL | Status: DC | PRN
  Administered 2012-11-20: 650 mg via ORAL

## 2012-11-19 MED ORDER — ALUM & MAG HYDROXIDE-SIMETH 200-200-20 MG/5ML PO SUSP: 30.0000 mL | ORAL | Status: DC | PRN

## 2012-11-19 MED ORDER — OXYCODONE HCL 5 MG PO TABS
15.0000 mg | ORAL_TABLET | Freq: Four times a day (QID) | ORAL | Status: DC | PRN
  Administered 2012-11-19 (×2): 15 mg via ORAL
  Filled 2012-11-19 (×2): qty 3

## 2012-11-19 MED ORDER — TRAZODONE HCL 50 MG PO TABS: 50.0000 mg | ORAL_TABLET | Freq: Every evening | ORAL | Status: DC | PRN

## 2012-11-19 MED ORDER — GABAPENTIN 300 MG PO CAPS
300.0000 mg | ORAL_CAPSULE | Freq: Every day | ORAL | Status: DC
  Administered 2012-11-19: 300 mg via ORAL
  Filled 2012-11-19 (×3): qty 1

## 2012-11-19 MED ORDER — PANTOPRAZOLE SODIUM 40 MG PO TBEC
40.0000 mg | DELAYED_RELEASE_TABLET | Freq: Every day | ORAL | Status: DC
  Administered 2012-11-20 – 2012-11-25 (×6): 40 mg via ORAL
  Filled 2012-11-19 (×8): qty 1

## 2012-11-19 MED ORDER — FLUOXETINE HCL 20 MG PO CAPS
20.0000 mg | ORAL_CAPSULE | Freq: Every day | ORAL | Status: DC
  Administered 2012-11-20 – 2012-11-25 (×6): 20 mg via ORAL
  Filled 2012-11-19 (×6): qty 1
  Filled 2012-11-19: qty 3
  Filled 2012-11-19 (×2): qty 1

## 2012-11-19 MED ORDER — DARIFENACIN HYDROBROMIDE ER 7.5 MG PO TB24
7.5000 mg | ORAL_TABLET | Freq: Every day | ORAL | Status: DC
  Administered 2012-11-20 – 2012-11-25 (×6): 7.5 mg via ORAL
  Filled 2012-11-19 (×8): qty 1

## 2012-11-19 MED ORDER — TRAZODONE HCL 150 MG PO TABS
300.0000 mg | ORAL_TABLET | Freq: Every day | ORAL | Status: DC
  Administered 2012-11-19 – 2012-11-22 (×4): 300 mg via ORAL
  Filled 2012-11-19 (×5): qty 2
  Filled 2012-11-19: qty 6

## 2012-11-19 MED ORDER — ALBUTEROL SULFATE HFA 108 (90 BASE) MCG/ACT IN AERS
2.0000 | INHALATION_SPRAY | Freq: Four times a day (QID) | RESPIRATORY_TRACT | Status: DC | PRN
  Administered 2012-11-22 – 2012-11-23 (×2): 2 via RESPIRATORY_TRACT
  Filled 2012-11-19: qty 6.7

## 2012-11-19 MED ORDER — ARIPIPRAZOLE 10 MG PO TABS
10.0000 mg | ORAL_TABLET | Freq: Every day | ORAL | Status: DC
  Administered 2012-11-20 – 2012-11-25 (×6): 10 mg via ORAL
  Filled 2012-11-19 (×5): qty 1
  Filled 2012-11-19: qty 3
  Filled 2012-11-19 (×3): qty 1

## 2012-11-19 MED ORDER — ATORVASTATIN CALCIUM 10 MG PO TABS
10.0000 mg | ORAL_TABLET | Freq: Every day | ORAL | Status: DC
  Administered 2012-11-19 – 2012-11-24 (×5): 10 mg via ORAL
  Filled 2012-11-19 (×9): qty 1

## 2012-11-19 NOTE — ED Provider Notes (Signed)
Patient accepted at behavioral health hospital by Dr. Jannifer Franklin.  BP 106/64  Pulse 80  Temp(Src) 97.8 F (36.6 C) (Oral)  Resp 20  SpO2 90%   Glynn Octave, MD 11/19/12 1527
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
## 2012-11-19 NOTE — ED Notes (Signed)
Resting with eyes closed.

## 2012-11-19 NOTE — ED Notes (Signed)
[facility] at bedside for transport.

## 2012-11-19 NOTE — ED Notes (Signed)
Report given to Marcelino Duster, Charity fundraiser at [employer]. Ready to receive patient.
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
## 2012-11-19 NOTE — ED Notes (Signed)
Diane, RN with ACT team at bedside.

## 2012-11-19 NOTE — ED Notes (Signed)
Patient speaking phone. Calm at present.

## 2012-11-19 NOTE — Tx Team (Signed)
Initial Interdisciplinary Treatment Plan  PATIENT STRENGTHS: (choose at least two) Average or above average intelligence Motivation for treatment/growth Physical Health Supportive family/friends  PATIENT STRESSORS: Multiple Personalities   PROBLEM LIST: Problem List/Patient Goals Date to be addressed Date deferred Reason deferred Estimated date of resolution  Depression 11/19/12     Suicide Risk 11/19/12                                                DISCHARGE CRITERIA:  Improved stabilization in mood, thinking, and/or behavior Motivation to continue treatment in a less acute level of care Need for constant or close observation no longer present Reduction of life-threatening or endangering symptoms to within safe limits Verbal commitment to aftercare and medication compliance  PRELIMINARY DISCHARGE PLAN: Outpatient therapy Return to previous living arrangement  PATIENT/FAMIILY INVOLVEMENT: This treatment plan has been presented to and reviewed with the patient, Cynthia Deleon, and/or family member, .  The patient and family have been given the opportunity to ask questions and make suggestions.  Renaee Munda 11/19/2012, 10:07 PM

## 2012-11-19 NOTE — ED Notes (Signed)
Patient left ED at this time with [facility]. No distress.

## 2012-11-19 NOTE — ED Notes (Signed)
Patient's sister wanting to visit. Patient states she would like to visit with her. Sister to bedside.

## 2012-11-20 ENCOUNTER — Encounter (HOSPITAL_COMMUNITY): Payer: Self-pay | Admitting: Psychiatry

## 2012-11-20 DIAGNOSIS — F319 Bipolar disorder, unspecified: Secondary | ICD-10-CM

## 2012-11-20 DIAGNOSIS — F4310 Post-traumatic stress disorder, unspecified: Secondary | ICD-10-CM

## 2012-11-20 MED ORDER — GABAPENTIN 300 MG PO CAPS
300.0000 mg | ORAL_CAPSULE | Freq: Three times a day (TID) | ORAL | Status: DC
  Administered 2012-11-20 – 2012-11-22 (×6): 300 mg via ORAL
  Filled 2012-11-20 (×12): qty 1

## 2012-11-20 MED ORDER — KETOROLAC TROMETHAMINE 30 MG/ML IJ SOLN
15.0000 mg | Freq: Once | INTRAMUSCULAR | Status: AC
  Administered 2012-11-20: 15 mg via INTRAMUSCULAR
  Filled 2012-11-20 (×2): qty 1

## 2012-11-20 MED ORDER — IBUPROFEN 400 MG PO TABS
400.0000 mg | ORAL_TABLET | Freq: Four times a day (QID) | ORAL | Status: DC | PRN
  Administered 2012-11-20 – 2012-11-25 (×7): 400 mg via ORAL
  Filled 2012-11-20 (×7): qty 1

## 2012-11-20 MED ORDER — CLONAZEPAM 1 MG PO TABS
1.0000 mg | ORAL_TABLET | Freq: Two times a day (BID) | ORAL | Status: DC
  Administered 2012-11-20 – 2012-11-23 (×6): 1 mg via ORAL
  Filled 2012-11-20 (×6): qty 1

## 2012-11-20 MED ORDER — TETRAHYDROZOLINE HCL 0.05 % OP SOLN
1.0000 [drp] | Freq: Two times a day (BID) | OPHTHALMIC | Status: DC
  Administered 2012-11-20 – 2012-11-25 (×10): 1 [drp] via OPHTHALMIC
  Filled 2012-11-20: qty 15

## 2012-11-20 MED ORDER — CHLORDIAZEPOXIDE HCL 25 MG PO CAPS
25.0000 mg | ORAL_CAPSULE | Freq: Four times a day (QID) | ORAL | Status: DC | PRN
  Administered 2012-11-20 (×2): 25 mg via ORAL
  Filled 2012-11-20 (×2): qty 1

## 2012-11-20 MED ORDER — CHLORDIAZEPOXIDE HCL 25 MG PO CAPS
25.0000 mg | ORAL_CAPSULE | Freq: Two times a day (BID) | ORAL | Status: DC | PRN
  Administered 2012-11-20 – 2012-11-22 (×4): 25 mg via ORAL
  Filled 2012-11-20 (×4): qty 1

## 2012-11-20 MED ORDER — GABAPENTIN 300 MG PO CAPS
300.0000 mg | ORAL_CAPSULE | Freq: Two times a day (BID) | ORAL | Status: DC
  Filled 2012-11-20: qty 1

## 2012-11-20 NOTE — [facility] Suicide Risk Assessment (Signed)
Suicide Risk Assessment  Admission Assessment     Nursing information obtained from:    Demographic factors:    Current Mental Status:    Loss Factors:    Historical Factors:    Risk Reduction Factors:     CLINICAL FACTORS:   Severe Anxiety and/or Agitation Bipolar Disorder:   Mixed State Depression:   Impulsivity Currently Psychotic Previous Psychiatric Diagnoses and Treatments  COGNITIVE FEATURES THAT CONTRIBUTE TO RISK:  Closed-mindedness Polarized thinking    SUICIDE RISK:   Mild:  Suicidal ideation of limited frequency, intensity, duration, and specificity.  There are no identifiable plans, no associated intent, mild dysphoria and related symptoms, good self-control (both objective and subjective assessment), few other risk factors, and identifiable protective factors, including available and accessible social support.  PLAN OF CARE:1. Admit for crisis management and stabilization. 2. Medication management to reduce current symptoms to base line and improve the     patient's overall level of functioning 3. Treat health problems as indicated. 4. Develop treatment plan to decrease risk of relapse upon discharge and the need for     readmission. 5. Psycho-social education regarding relapse prevention and self care. 6. Health care follow up as needed for medical problems. 7. Restart home medications where appropriate.   I certify that inpatient services furnished can reasonably be expected to improve the patient's condition.  Amamda Curbow,MD 11/20/2012, 11:39 AM
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
## 2012-11-20 NOTE — Progress Notes (Signed)
D:Pt c/o anxiety and pain in multiple areas of her body. Pt reports multiple personalities including "Cookie" that tells her she is worthless and stupid. Pt reports that she lives with her 57 yr old son and has 3 other children. Pt's first husband passed away and she divorced her second husband. Pt reports passive si with no plan. A:Supported pt to discuss feelings. Offered encouragement and 15 minute checks. Gave medication as ordered. R:Pt denies hi. She contracts with staff for safety.

## 2012-11-20 NOTE — Progress Notes (Signed)
Adult Psychoeducational Group Note  Date:  11/20/2012 Time:  10:27 PM  Group Topic/Focus:  Goals Group:   The focus of this group is to help patients establish daily goals to achieve during treatment and discuss how the patient can incorporate goal setting into their daily lives to aide in recovery.  Participation Level:  Active  Participation Quality:  Appropriate  Affect:  Appropriate  Cognitive:  Appropriate  Insight: Appropriate  Engagement in Group:  Engaging  Modes of Intervention:  Discussion  Additional Comments:  Pt stated that she had a good day, got to know more people here, and that the medications is her best way to stop hearing the voices in her head.  Terie Purser R 11/20/2012, 10:27 PM
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
## 2012-11-20 NOTE — Progress Notes (Signed)
Recreation Therapy Notes  Date: 06.13.2014 Time: 9:30am Location: [address] Day Room      Group Topic/Focus: Leisure Education  Participation Level: Did not attend  [employer], LRT/CTRS  Jearl Klinefelter 11/20/2012 3:22 PM

## 2012-11-20 NOTE — [facility] Group Notes (Signed)
[facility] LCSW Group Therapy  11/20/2012 , 3:06 PM   Type of Therapy:  Group Therapy  Participation Level:  Active  Participation Quality:  Attentive  Affect:  Appropriate  Cognitive:  Alert  Insight:  Improving  Engagement in Therapy:  Engaged  Modes of Intervention:  Discussion, Exploration and Socialization  Summary of Progress/Problems: Today's group focused on the term Diagnosis.  Participants were asked to define the term, and then pronounce whether it is a negative, positive or neutral term.  Mariha was quick to share that she has been misdiagnoses and misunderstood, and consequently mistreated.  Unwilling/unable to take responsibility for herself, though she did say at the end that her challenge to self was to have more empathy for others, drop judgement and truly try to understand them rather than stereotype.  Daryel Gerald B 11/20/2012 , 3:06 PM

## 2012-11-21 DIAGNOSIS — F3160 Bipolar disorder, current episode mixed, unspecified: Principal | ICD-10-CM

## 2012-11-21 MED ORDER — LOPERAMIDE HCL 2 MG PO CAPS
2.0000 mg | ORAL_CAPSULE | ORAL | Status: DC | PRN
Start: 2012-11-21 — End: 2012-11-25

## 2012-11-21 MED ORDER — LOPERAMIDE HCL 2 MG PO CAPS
4.0000 mg | ORAL_CAPSULE | Freq: Once | ORAL | Status: AC
  Administered 2012-11-21: 4 mg via ORAL
  Filled 2012-11-21: qty 2
  Filled 2012-11-21: qty 1

## 2012-11-21 NOTE — [facility] Group Notes (Signed)
[facility] LCSW Group Therapy  Stages of Change 11/21/2012  1:12 PM    Type of Therapy:  Group Therapy  Participation Level:  Minimal  Participation Quality:  Appropriate  Affect:  Appropriate  Cognitive:  Appropriate  Insight:  Developing  Engagement in Therapy:  Developing  Modes of Intervention:  Discussion, Education, Exploration, Problem-Solving, Rapport Building, Support  Summary of Progress/Problems: The focus for today's process group was sabotage.  Patient were asked to identify what sabotage means to them and ways they have sabotaged their past recovery.  Patient was able to state sabotage means to destroy something but did not comment on how she could sabotage her treatment.   Wynn Banker 11/21/2012  1:12 PM

## 2012-11-21 NOTE — Progress Notes (Signed)
[name], female   DOB: 04-Mar-1956, 57 y.o.   MRN: [id number] Psychoeducational Group Note  Date:  11/21/2012 Time:1000am  Group Topic/Focus:  Identifying Needs:   The focus of this group is to help patients identify their personal needs that have been historically problematic and identify healthy behaviors to address their needs.  Participation Level:  Active  Participation Quality:  Appropriate  Affect:  Anxious  Cognitive:  Appropriate  Insight:  Supportive  Engagement in Group:  Supportive  Additional Comments:  Inventory and Psychoeducational group   Valente David 11/21/2012,10:18 AM

## 2012-11-21 NOTE — Progress Notes (Signed)
[name], female   DOB: [birth year], 57 y.o.   MRN: [id number]  S;   Seen today. Upset about pain at this time. Reports pain in abdomen now. When asked about details but could not give much details. Reports hx of PTSD.   O:  General Appearance: Fairly Groomed  Patent attorney:: Minimal  Speech: Normal Rate  Volume: Normal  Mood: Anxious and Dysphoric  Affect: Tearful  Thought Process: Disorganized  Orientation: Full (Time, Place, and Person)  Thought Content: denies Hallucinations: Suicidal Thoughts: denies  Homicidal Thoughts: No Memory: Immediate; Fair  Recent; Fair  Remote; Fair  Judgement: Poor  Insight: Lacking  Psychomotor Activity: Normal  Concentration: Poor  Recall: Poor  Akathisia: No   Assessment:  AXIS I: Bipolar, mixed and Post Traumatic Stress Disorder  AXIS II: Cluster C Traits  AXIS III:  Past Medical History   Diagnosis  Date   .  Bipolar 1 disorder  Degenerative disk disease   .  Arthritis      hands and knees   .  Panic attacks    .  Hypertension    .  Chronic back pain    .  Incontinence    .  Chronic neck pain    .  Multiple personality disorder    .  Depression     AXIS IV: other psychosocial or environmental problems and problems related to social environment  AXIS V: 35  Treatment Plan/Recommendations:   will Medication management to reduce current symptoms to base line and improve the patient's overall level of functioning

## 2012-11-21 NOTE — Progress Notes (Signed)
[name], female   DOB: 1955-12-29, 57 y.o.   MRN: [id number]  D:  Pt endorses AVH, but contracts for safety. Pt states " cookie is always there" Pt denies SI/HI. Pt is pleasant and cooperative. Pt was anxious earlier " I can jump through the ceiling" , so pt was given librium tonight. Pt states she uses a CPAP machine at home and her son was bringing it to [facility] in the morning  A: Pt was offered support and encouragement. Pt was given scheduled medications. Pt was encourage to attend groups. Q 15 minute checks were done for safety.   R:Pt attends groups and interacts well with peers and staff. Pt is taking medication. Pt receptive to treatment and safety maintained on unit. Pt anxiety reduced enough for pt to go to sleep.

## 2012-11-21 NOTE — Progress Notes (Signed)
[name], female   DOB: Nov 11, 1955, 57 y.o.   MRN: [id number] D. The patient is somewhat labile. Guarded, but interacting in the milieu. A. Encouraged to attend evening group. Reviewed medication with patient. Administered HS medications.  R. Attended evening group. Concerned about not sleeping at night. Compliant with medication.

## 2012-11-22 MED ORDER — DIPHENHYDRAMINE HCL 25 MG PO CAPS
25.0000 mg | ORAL_CAPSULE | Freq: Every day | ORAL | Status: DC
  Administered 2012-11-22: 25 mg via ORAL
  Filled 2012-11-22 (×3): qty 1

## 2012-11-22 MED ORDER — GABAPENTIN 300 MG PO CAPS
300.0000 mg | ORAL_CAPSULE | Freq: Three times a day (TID) | ORAL | Status: DC
  Administered 2012-11-22 – 2012-11-23 (×3): 300 mg via ORAL
  Filled 2012-11-22 (×8): qty 1

## 2012-11-22 NOTE — Progress Notes (Signed)
D. Pt has been up and has been active while in milieu today has been active and participating in various milieu activities. Pt has complained of anxiety throughout the day and also complained of back pain. Pt requested and received PRN medication for each complaint. Pt spoke about how she has been feeling manic, has not been sleeping well and has racing thoughts. A. Support and encouragement provided, medication education completed. R. Pt verbalized understanding, will continue to monitor.
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
## 2012-11-22 NOTE — Progress Notes (Signed)
[name], female   DOB: 12-25-1955, 57 y.o.   MRN: [id number] Psychoeducational Group Note  Date:  11/22/2012 Time:  1000am  Group Topic/Focus:  Making Healthy Choices:   The focus of this group is to help patients identify negative/unhealthy choices they were using prior to admission and identify positive/healthier coping strategies to replace them upon discharge.  Participation Level:  Active  Participation Quality:  Appropriate  Affect:  Blunted  Cognitive:  Appropriate  Insight:  Supportive  Engagement in Group:  Supportive  Additional Comments:  Inventory and Psychoeducational group   Valente David 11/22/2012,10:42 AM

## 2012-11-22 NOTE — Progress Notes (Signed)
[facility] Group Notes:  (Nursing/MHT/Case Management/Adjunct)  Date:  11/21/2012 Time:  2000  Type of Therapy:  Psychoeducational Skills  Participation Level:  Active  Participation Quality:  Monopolizing  Affect:  Excited  Cognitive:  Appropriate  Insight:  Lacking  Engagement in Group:  Monopolizing  Modes of Intervention:  Education  Summary of Progress/Problems: The patient described her day as having been "not good". She stated that she didn't sleep well last night and has not slept for days. In addition, she mentioned that she doesn't feel that her doctor cares about her and that if she doesn't get some rest, then she would like to be transferred to another hospital. Her goal for tomorrow is to get a good night's rest.   Wallace Cogliano S 11/22/2012, 12:55 AM

## 2012-11-22 NOTE — [facility] Group Notes (Signed)
[facility] LCSW Group Therapy  11/22/2012  11:00  Type of Therapy:  Group Therapy  Participation Level:  Active  Participation Quality:  Attentive  Affect:  Appropriate  Cognitive:  Oriented  Insight:  Limited  Engagement in Therapy:  Engaged  Modes of Intervention:  Discussion, Exploration and Socialization  Summary of Progress/Problems:   The main focus of today's process group was to identify the patient's current support system and decide on other supports that can be put in place. Four definitions/levels of support were discussed and an exercise was utilized to show how much stronger we become with additional supports. An emphasis was placed on using counselor, doctor, therapy groups, 12-step groups, and problem-specific support groups to expand supports, as well as doing something different than has been done before. Cynthia Deleon was engaged in group, but really did not have that much to say on the subject.  She seemed more invested in interactions with other group members, especially one person who she was determined to make smile or laugh [she succeeded].  She has a bit of difficulty staying on task of the conversation at hand, and with her short term memory as well.  [name], [place] 11/22/2012 12:57 PM

## 2012-11-22 NOTE — Progress Notes (Addendum)
Patient denied SI and HI.   Denied A/V hallucinations while talking to nurse.  MD entered order for patient to use cpap tonight.  Patient went to recreation this afternoon.   Patient talked to her new roommate about roommate's children, coherent, no signs/symptoms of distress or pain.

## 2012-11-23 MED ORDER — CLONAZEPAM 0.5 MG PO TABS
0.5000 mg | ORAL_TABLET | Freq: Two times a day (BID) | ORAL | Status: DC
  Administered 2012-11-23 – 2012-11-25 (×4): 0.5 mg via ORAL
  Filled 2012-11-23 (×4): qty 1

## 2012-11-23 MED ORDER — SALINE SPRAY 0.65 % NA SOLN
1.0000 | NASAL | Status: DC | PRN
  Administered 2012-11-23: 1 via NASAL
  Filled 2012-11-23: qty 44

## 2012-11-23 MED ORDER — HYDROXYZINE HCL 50 MG PO TABS
50.0000 mg | ORAL_TABLET | Freq: Three times a day (TID) | ORAL | Status: DC | PRN
  Administered 2012-11-23 – 2012-11-25 (×5): 50 mg via ORAL
  Filled 2012-11-23 (×2): qty 1
  Filled 2012-11-23: qty 12
  Filled 2012-11-23 (×3): qty 1

## 2012-11-23 MED ORDER — GABAPENTIN 400 MG PO CAPS
400.0000 mg | ORAL_CAPSULE | Freq: Three times a day (TID) | ORAL | Status: DC
  Administered 2012-11-23 – 2012-11-25 (×9): 400 mg via ORAL
  Filled 2012-11-23 (×5): qty 1
  Filled 2012-11-23 (×2): qty 12
  Filled 2012-11-23 (×2): qty 1
  Filled 2012-11-23: qty 12
  Filled 2012-11-23 (×2): qty 1
  Filled 2012-11-23: qty 12
  Filled 2012-11-23 (×7): qty 1

## 2012-11-23 MED ORDER — DOXEPIN HCL 50 MG PO CAPS
100.0000 mg | ORAL_CAPSULE | Freq: Every day | ORAL | Status: DC
  Administered 2012-11-23 – 2012-11-24 (×2): 100 mg via ORAL
  Filled 2012-11-23: qty 6
  Filled 2012-11-23 (×4): qty 1

## 2012-11-23 MED ORDER — HYDROXYZINE HCL 50 MG/ML IM SOLN: 50.0000 mg | Freq: Four times a day (QID) | INTRAMUSCULAR | Status: DC | PRN

## 2012-11-23 NOTE — Progress Notes (Signed)
D:Pt c/o anxiety, being manic and decreased sleep, waking during the night. Pt reports that she uses a different type of c-pap machine than used in the hospital and is requesting a family member to bring today to help with sleep tonight. Pt c/o dry eyes and congestion with ear discomfort. Pt states that she "always had a feeling to throw a cinder block through someone's window. Pt is mildly confused at times. Pt states "I don't have a problem coming off of footballs, just give me something to keep me calm." Pt is interacting on the unit and attending groups. A:Offered support, encouragement, redirection and 15 minute checks. Gave medication as ordered and discussed pt complaints with treatment team along with the pt. R:Pt denies si and hi. Safety maintained on the unit.

## 2012-11-23 NOTE — [facility] Group Notes (Signed)
[facility] LCSW Aftercare Discharge Planning Group Note   11/23/2012 9:34 AM  Participation Quality:  Engaged  Mood/Affect:  Flat  Depression Rating:  6  Anxiety Rating:  6  Thoughts of Suicide:  No Will you contract for safety?   NA  Current AVH:  No  Plan for Discharge/Comments:  C/O "manic" symptoms.  Clarifies by saying it is racing thoughts.  Not sleeping as well as she would like.  Son is bringing up CPAP today.  Feels like her mental health medications need to be adjusted.  Transportation Means: family  Supports: family  [place], Baldo Daub

## 2012-11-23 NOTE — Progress Notes (Signed)
[name], female   DOB: 07/08/1955, 57 y.o.   MRN: [id number] [facility] MD Progress Note  11/23/2012 10:30 AM NEVILLE PAULS  MRN:  [id number] Subjective:  "I have racing thoughts and my sinus is acting up" Objective: Patient reports difficulty sleeping due to racing thoughts, she continues to endorse anxiety and reporting decreased auditory hallucinations. Diagnosis:   Axis I: Bipolar 1 disorder mixed episode.  Post Traumatic Stress Disorder Axis II: Cluster C Traits Axis III:  Past Medical History  Diagnosis Date  . Bipolar 1 disorder Degenerative disk disease  . Arthritis     hands and knees  . Panic attacks   . Hypertension   . Chronic back pain   . Incontinence   . Chronic neck pain   . Multiple personality disorder   . Depression     ADL's:  Intact  Sleep: Fair  Appetite:  Good  Suicidal Ideation:  Denies Homicidal Ideation:  Denies  AEB (as evidenced by):  Psychiatric Specialty Exam: Review of Systems  Constitutional: Negative.   HENT: Negative.   Eyes: Negative.   Respiratory: Negative.   Cardiovascular: Negative.   Gastrointestinal: Negative.   Genitourinary: Negative.   Musculoskeletal: Positive for back pain.  Skin: Negative.   Neurological: Negative.   Endo/Heme/Allergies: Negative.   Psychiatric/Behavioral: The patient is nervous/anxious and has insomnia.     Blood pressure 118/81, pulse 101, temperature 98.1 F (36.7 C), temperature source Oral, resp. rate 18, height 5\' 7"  (1.702 m), weight 81.647 kg (180 lb).Body mass index is 28.19 kg/(m^2).  General Appearance: Bizarre and Disheveled  Eye Contact::  Fair  Speech:  Clear and Coherent  Volume:  Normal  Mood:  Dysphoric  Affect:  Congruent  Thought Process:  Logical  Orientation:  Full (Time, Place, and Person)  Thought Content:  Delusions  Suicidal Thoughts:  No  Homicidal Thoughts:  No  Memory:  Immediate;   Fair Recent;   Fair Remote;   Poor  Judgement:  Impaired   Insight:  Shallow  Psychomotor Activity:  Normal  Concentration:  Good  Recall:  Good  Akathisia:  No  Handed:  Right  AIMS (if indicated):     Assets:  Communication Skills Desire for Improvement  Sleep:  Number of Hours: 4.25   Current Medications: Current Facility-Administered Medications  Medication Dose Route Frequency Provider Last Rate Last Dose  . albuterol (PROVENTIL HFA;VENTOLIN HFA) 108 (90 BASE) MCG/ACT inhaler 2 puff  2 puff Inhalation Q6H PRN Kerry Hough, PA-C   2 puff at 11/23/12 0405  . alum & mag hydroxide-simeth (MAALOX/MYLANTA) 200-200-20 MG/5ML suspension 30 mL  30 mL Oral Q4H PRN Kerry Hough, PA-C      . ARIPiprazole (ABILIFY) tablet 10 mg  10 mg Oral Daily Kerry Hough, PA-C   10 mg at 11/23/12 0800  . atorvastatin (LIPITOR) tablet 10 mg  10 mg Oral q1800 Kerry Hough, PA-C   10 mg at 11/22/12 1820  . chlordiazePOXIDE (LIBRIUM) capsule 25 mg  25 mg Oral BID PRN Mariyana Fulop   25 mg at 11/22/12 1307  . clonazePAM (KLONOPIN) tablet 0.5 mg  0.5 mg Oral BID Anarosa Kubisiak      . darifenacin (ENABLEX) 24 hr tablet 7.5 mg  7.5 mg Oral Daily Kerry Hough, PA-C   7.5 mg at 11/23/12 0800  . FLUoxetine (PROZAC) capsule 20 mg  20 mg Oral Daily Kerry Hough, PA-C   20 mg at 11/23/12 0800  .  gabapentin (NEURONTIN) capsule 400 mg  400 mg Oral TID WC & HS Tayshaun Kroh      . ibuprofen (ADVIL,MOTRIN) tablet 400 mg  400 mg Oral Q6H PRN Jaycub Noorani   400 mg at 11/23/12 0535  . loperamide (IMODIUM) capsule 2 mg  2 mg Oral PRN Court Joy, PA-C      . magnesium hydroxide (MILK OF MAGNESIA) suspension 30 mL  30 mL Oral Daily PRN Kerry Hough, PA-C      . pantoprazole (PROTONIX) EC tablet 40 mg  40 mg Oral Daily Kerry Hough, PA-C   40 mg at 11/23/12 0800  . sodium chloride (OCEAN) 0.65 % nasal spray 1 spray  1 spray Each Nare PRN Court Joy, PA-C   1 spray at 11/23/12 0535  . tetrahydrozoline 0.05 % ophthalmic solution 1 drop  1 drop Both  Eyes BID Garland Smouse   1 drop at 11/23/12 0409  . traZODone (DESYREL) tablet 300 mg  300 mg Oral QHS Kerry Hough, PA-C   300 mg at 11/22/12 2152    Lab Results: No results found for this or any previous visit (from the past 48 hour(s)).  Physical Findings: AIMS: Facial and Oral Movements Muscles of Facial Expression: None, normal Lips and Perioral Area: None, normal Jaw: None, normal Tongue: None, normal,Extremity Movements Upper (arms, wrists, hands, fingers): None, normal Lower (legs, knees, ankles, toes): None, normal, Trunk Movements Neck, shoulders, hips: None, normal, Overall Severity Severity of abnormal movements (highest score from questions above): None, normal Incapacitation due to abnormal movements: None, normal Patient's awareness of abnormal movements (rate only patient's report): No Awareness, Dental Status Current problems with teeth and/or dentures?: No Does patient usually wear dentures?: No  CIWA:  CIWA-Ar Total: 1 COWS:  COWS Total Score: 2  Treatment Plan Summary: Daily contact with patient to assess and evaluate symptoms and progress in treatment Medication management  Plan: We will place an order for a CPAP. Increase Neurontin to 400mg  po TID. D/C Trazodone and initiate Doxepin 100mg  po qhs for anxiety and insomnia. Continue other medication regimen. Medical Decision Making Problem Points:  Established problem, stable/improving (1) and Review of psycho-social stressors (1) Data Points:  Review or order clinical lab tests (1) Review of new medications or change in dosage (2)  I certify that inpatient services furnished can reasonably be expected to improve the patient's condition.   Waris Rodger,MD 11/23/2012, 10:30 AM

## 2012-11-23 NOTE — Progress Notes (Signed)
[facility] Group Notes:  (Nursing/MHT/Case Management/Adjunct)  Date:  11/22/2012 Time:  2000  Type of Therapy:  Psychoeducational Skills  Participation Level:  Active  Participation Quality:  Attentive  Affect:  Excited  Cognitive:  Appropriate  Insight:  Improving  Engagement in Group:  Engaged  Modes of Intervention:  Education  Summary of Progress/Problems: The patient verbalized that she didn't have a very good day since she felt that she was in "a manic state". She indicated that Benadryl had been ordered . In addition, she shared that her doctor is trying to switch her sleep medication. She also shared that she is concerned about not getting enough rest and that she might "crash" once she gets off of the manic high. Her goal for tomorrow is to get more rest and to speak with the doctor regarding the medication changes.   Harun Brumley S 11/23/2012, 2:36 AM

## 2012-11-23 NOTE — Progress Notes (Signed)
Recreation Therapy Notes  Date: 06.16.2014 Time:9:30am  Location: [address] Dayroom  Group Topic/Focus: Stress Managment   Participation Level:  Active   Participation Quality:  Appropriate  Affect:  Euthymic   Cognitive:  Appropraite   Additional Comments: Activity: Progressive Muscle Relaxation ; Explanation: Patient listened to recording of Progressive Muscle Relaxation Script.   Patient actively participated in group activity. Patient with female peer talked to each other in whispers during group session, it appeared they were verifying they were following script properly. Patient with female peer became giddy at one point and giggled amongst themselves. Patient with peer recovered quickly and continued to participate in group activity. Patient asked where she could "find that stuff" post d/c. LRT and peer advised her as to where she could find pre-recorded scripts. Patient complained of neck pain following activity. LRT advised patient not to stretch beyond a comfortable limit in the future and if pain persisted to let her nurse know.   Jearl Klinefelter, LRT/ CTRS  [place], Milanni Ayub L 11/23/2012 10:15 AM

## 2012-11-23 NOTE — Progress Notes (Signed)
Child/Adolescent Psychoeducational Group Note  Date:  11/23/2012 Time:  12:04 PM  Group Topic/Focus:  Self Care:   The focus of this group is to help patients understand the importance of self-care in order to improve or restore emotional, physical, spiritual, interpersonal, and financial health.  Participation Level:  Active  Participation Quality:  Appropriate and Attentive  Affect:  Appropriate  Cognitive:  Alert and Appropriate  Insight:  Appropriate  Engagement in Group:  Engaged  Modes of Intervention:  Discussion and Education  Additional Comments:  Pt attended group and was  very involved in group discussion.  Shelly Bombard D 11/23/2012, 12:04 PM

## 2012-11-23 NOTE — Progress Notes (Signed)
Pt son brought in CPAP machine today

## 2012-11-23 NOTE — [facility] Group Notes (Signed)
[facility] LCSW Group Therapy  11/23/2012 1:15 pm  Type of Therapy: Process Group Therapy  Participation Level:  Active  Participation Quality:  Appropriate  Affect:  Flat  Cognitive:  Oriented  Insight:  Improving  Engagement in Group:  Limited  Engagement in Therapy:  Limited  Modes of Intervention:  Activity, Clarification, Education, Problem-solving and Support  Summary of Progress/Problems: Today's group addressed the issue of overcoming obstacles.  Patients were asked to identify their biggest obstacle post d/c that stands in the way of their on-going success, and then problem solve as to how to manage this.  States her biggest obstacle is her temper.  Went on to externalize problems on sister, but able to be brought back to herself.  Acknowledges she needs to take meds, and was also open to feedback from others about using mindfullness and removing self from situations.  She declared she would cease contact with sister, and that should take care of problem.  Daryel Gerald B 11/23/2012   9:40 AM

## 2012-11-23 NOTE — Progress Notes (Signed)
[name], female   DOB: 12-03-1955, 57 y.o.   MRN: [id number]    D: Pt informed the writer that she was on a "manic high". Then preceded to ask a nurse whom she was familiar with if there was a medication she needed to ask for".  Other RN attempted to redirect the pt back to the writer.  Writer asked pt about any symptoms she was experiencing. Pt had no other concerns or questions.  A:  Support and encouragement was offered. 15 min checks continued for safety.  R: Pt remains safe.

## 2012-11-24 MED ORDER — LORATADINE 10 MG PO TABS
10.0000 mg | ORAL_TABLET | Freq: Every day | ORAL | Status: DC
  Administered 2012-11-24 – 2012-11-25 (×2): 10 mg via ORAL
  Filled 2012-11-24 (×5): qty 1

## 2012-11-24 NOTE — [facility] Group Notes (Signed)
[facility] LCSW Aftercare Discharge Planning Group Note   11/24/2012 5:08 PM  Participation Quality:  Engaged  Mood/Affect:  Flat  Depression Rating:  denies  Anxiety Rating:  denies  Thoughts of Suicide:  No Will you contract for safety?   NA  Current AVH:  No  Plan for Discharge/Comments:  Xitlally states she got a good night sleep last night with her c-pap machine that her son brought in, and that made a huge difference.  She also states she is "controlling my racing thoughts," and they are no longer bothering her.  States she feels ready for d/c.  Told her I would pass this on to Dr.  [employer]: son  Supports: son   Ida Rogue

## 2012-11-24 NOTE — Progress Notes (Signed)
[facility] MD Progress Note  11/24/2012 11:58 AM Cynthia Deleon  MRN:  [id number] Subjective:  Cynthia Deleon reports that she slept well for the first time last night as she got her CPAP from home. She also endorses a good appetite. She denies any suicidal or homicidal ideation. She states that she has "Cookie" under control. She reports that she heard from Cookie this morning, and Cookie list telling her that she is stupid even no she is here. She denies any visual hallucinations. She complains that her ears are bothering her and she needs an antihistamine medication. She does not understand why the Benadryl ordered on Sunday was discontinued. She also reports that she has bumps developing on the left lateral aspect of her tongue.  Diagnosis:   Axis I: Bipolar 1 disorder mixed episode. Post Traumatic Stress Disorder Axis II: Cluster C Traits Axis III:  Past Medical History  Diagnosis Date  . Bipolar 1 disorder Degenerative disk disease  . Arthritis     hands and knees  . Panic attacks   . Hypertension   . Chronic back pain   . Incontinence   . Chronic neck pain   . Multiple personality disorder   . Depression     ADL's:  Intact  Sleep: Good  Appetite:  Good  Suicidal Ideation:  Patient denies any thought, plan, or intent Homicidal Ideation:  Patient denies any thought, plan, or intent AEB (as evidenced by):  Psychiatric Specialty Exam: Review of Systems  Constitutional: Negative.   HENT: Positive for congestion.   Eyes: Negative.   Respiratory: Negative.   Cardiovascular: Negative.   Gastrointestinal: Negative.   Genitourinary: Negative.   Musculoskeletal: Negative.   Skin: Negative.   Neurological: Negative.   Endo/Heme/Allergies: Negative.   Psychiatric/Behavioral: Positive for hallucinations. Negative for suicidal ideas.    Blood pressure 120/83, pulse 98, temperature 97 F (36.1 C), temperature source Oral, resp. rate 20, height 5\' 7"  (1.702 m), weight 81.647 kg (180  lb).Body mass index is 28.19 kg/(m^2).  General Appearance: Bizarre and Disheveled  Eye Contact::  Good  Speech:  Clear and Coherent  Volume:  Normal  Mood:  Anxious and Irritable  Affect:  Congruent  Thought Process:  Goal Directed and Logical  Orientation:  Full (Time, Place, and Person)  Thought Content:  Hallucinations: Auditory  Suicidal Thoughts:  No  Homicidal Thoughts:  No  Memory:  Immediate;   Fair Recent;   Fair Remote;   Fair  Judgement:  Impaired  Insight:  Lacking  Psychomotor Activity:  Normal  Concentration:  Good  Recall:  Good  Akathisia:  No  Handed:  Right  AIMS (if indicated):     Assets:  Resilience  Sleep:  Number of Hours: 5.5   Current Medications: Current Facility-Administered Medications  Medication Dose Route Frequency Provider Last Rate Last Dose  . albuterol (PROVENTIL HFA;VENTOLIN HFA) 108 (90 BASE) MCG/ACT inhaler 2 puff  2 puff Inhalation Q6H PRN Kerry Hough, PA-C   2 puff at 11/23/12 0405  . alum & mag hydroxide-simeth (MAALOX/MYLANTA) 200-200-20 MG/5ML suspension 30 mL  30 mL Oral Q4H PRN Kerry Hough, PA-C      . ARIPiprazole (ABILIFY) tablet 10 mg  10 mg Oral Daily Kerry Hough, PA-C   10 mg at 11/24/12 0756  . atorvastatin (LIPITOR) tablet 10 mg  10 mg Oral q1800 Kerry Hough, PA-C   10 mg at 11/22/12 1820  . clonazePAM (KLONOPIN) tablet 0.5 mg  0.5 mg Oral BID  Mojeed Akintayo   0.5 mg at 11/24/12 0756  . darifenacin (ENABLEX) 24 hr tablet 7.5 mg  7.5 mg Oral Daily Kerry Hough, PA-C   7.5 mg at 11/24/12 0756  . doxepin (SINEQUAN) capsule 100 mg  100 mg Oral QHS Mojeed Akintayo   100 mg at 11/23/12 2250  . FLUoxetine (PROZAC) capsule 20 mg  20 mg Oral Daily Kerry Hough, PA-C   20 mg at 11/24/12 0756  . gabapentin (NEURONTIN) capsule 400 mg  400 mg Oral TID WC & HS Mojeed Akintayo   400 mg at 11/24/12 0756  . hydrOXYzine (ATARAX/VISTARIL) tablet 50 mg  50 mg Oral TID PRN Mojeed Akintayo   50 mg at 11/24/12 0328  .  ibuprofen (ADVIL,MOTRIN) tablet 400 mg  400 mg Oral Q6H PRN Mojeed Akintayo   400 mg at 11/23/12 1425  . loperamide (IMODIUM) capsule 2 mg  2 mg Oral PRN Court Joy, PA-C      . magnesium hydroxide (MILK OF MAGNESIA) suspension 30 mL  30 mL Oral Daily PRN Kerry Hough, PA-C      . pantoprazole (PROTONIX) EC tablet 40 mg  40 mg Oral Daily Kerry Hough, PA-C   40 mg at 11/24/12 0756  . sodium chloride (OCEAN) 0.65 % nasal spray 1 spray  1 spray Each Nare PRN Court Joy, PA-C   1 spray at 11/23/12 0535  . tetrahydrozoline 0.05 % ophthalmic solution 1 drop  1 drop Both Eyes BID Mojeed Akintayo   1 drop at 11/24/12 0756    Lab Results: No results found for this or any previous visit (from the past 48 hour(s)).  Physical Findings: AIMS: Facial and Oral Movements Muscles of Facial Expression: None, normal Lips and Perioral Area: None, normal Jaw: None, normal Tongue: None, normal,Extremity Movements Upper (arms, wrists, hands, fingers): None, normal Lower (legs, knees, ankles, toes): None, normal, Trunk Movements Neck, shoulders, hips: None, normal, Overall Severity Severity of abnormal movements (highest score from questions above): None, normal Incapacitation due to abnormal movements: None, normal Patient's awareness of abnormal movements (rate only patient's report): No Awareness, Dental Status Current problems with teeth and/or dentures?: No Does patient usually wear dentures?: No  CIWA:  CIWA-Ar Total: 1 COWS:  COWS Total Score: 2  Treatment Plan Summary: Daily contact with patient to assess and evaluate symptoms and progress in treatment Medication management  Plan: We will continue her current plan of care and monitor for changes of mood and behavior. We will start Claritin to address her sinus congestion.  Medical Decision Making Problem Points:  Established problem, stable/improving (1), New problem, with no additional work-up planned (3) and Review of  psycho-social stressors (1) Data Points:  Review or order clinical lab tests (1) Review of medication regiment & side effects (2) Review of new medications or change in dosage (2)  I certify that inpatient services furnished can reasonably be expected to improve the patient's condition.   Cynthia Deleon 11/24/2012, 11:58 AM

## 2012-11-24 NOTE — [facility] Group Notes (Signed)
[facility] LCSW Group Therapy  11/24/2012 , 5:11 PM   Type of Therapy:  Group Therapy  Participation Level:  Active  Participation Quality:  Attentive  Affect:  Appropriate  Cognitive:  Alert  Insight:  Improving  Engagement in Therapy:  Engaged  Modes of Intervention:  Discussion, Exploration and Socialization  Summary of Progress/Problems: Today's group focused on the term Diagnosis.  Participants were asked to define the term, and then pronounce whether it is a negative, positive or neutral term.  Lizbet went through her whole litany of diagnosis-including Bipolar, Postpartum depression and "this personality thing."  Was able to take responsibility for own well-being by stating that she knows she needs to break old habits "like how I was laying in bed for 2 weeks before coming in here."  Also talked about importance of staying on meds.  Today is the first day she presented as hopeful and postive about her future.  Daryel Gerald B 11/24/2012 , 5:11 PM

## 2012-11-24 NOTE — Progress Notes (Signed)
[name], female   DOB: 28-Aug-1955, 57 y.o.   MRN: [id number]  D: Pt was focused on her meds. Stated, "the Dr was supposed to give me something tonight. It should have come in at 2000. Writer informed the pt of the med changes and pt proceeded to ask for benadryl. Writer informed pt that the benadryl was discontinued, but that the doxepin was prescribed for anxiety and insomnia.   A:  Support and encouragement was offered. 15 min checks continued for safety.  R: Pt remains safe.

## 2012-11-24 NOTE — Progress Notes (Addendum)
Adult Psychoeducational Group Note  Date:  11/24/2012 Time:  10:30 AM  Group Topic/Focus:  Recovery Goals:   The focus of this group is to identify appropriate goals for recovery and establish a plan to achieve them.  Participation Level:  Active  Participation Quality:  Redirectable  Affect:  Appropriate  Cognitive:  Lacking  Insight: Lacking  Engagement in Group:  Developing/Improving, Distracting and Off Topic  Modes of Intervention:  Discussion  Additional Comments:  Pt could not come up with an attainable goal. Pt was redirected and off topic many times in group and referred to herself as "Mother Hen".    Roanna Banning, [place] N 11/24/2012, 10:30 AM

## 2012-11-24 NOTE — Progress Notes (Signed)
D: Patient denies SI/HI and A/V hallucinations; patient reports sleep is well; reports appetite is good; reports energy level is normal; reports ability to pay attention is good; rates depression as 1/10; rates hopelessness 1/10; patient reports " im not hearing any voices at this time"  A: Monitored q 15 minutes; patient encouraged to attend groups; patient educated about medications; patient given medications per physician orders; patient encouraged to express feelings and/or concerns  R: Patient is animated and cooperative; patient does not appear to be responding to any internal stimuli at this time; patient's interaction with staff and peers is appropriate and she is even reporting about other patients in the milieu; patient was able to set goal to talk with staff 1:1 about any return of the hallucinations; patient is taking medications as prescribed and tolerating medications; patient is attending all groups and engaging

## 2012-11-25 MED ORDER — CLONAZEPAM 0.5 MG PO TABS: 0.5000 mg | ORAL_TABLET | Freq: Every day | ORAL | Status: DC

## 2012-11-25 MED ORDER — DOXEPIN HCL 100 MG PO CAPS: 100.0000 mg | ORAL_CAPSULE | Freq: Every day | ORAL | Status: DC

## 2012-11-25 MED ORDER — ROSUVASTATIN CALCIUM 10 MG PO TABS: 10.0000 mg | ORAL_TABLET | Freq: Every day | ORAL | Status: DC

## 2012-11-25 MED ORDER — ALBUTEROL SULFATE HFA 108 (90 BASE) MCG/ACT IN AERS: 1.0000 | INHALATION_SPRAY | Freq: Four times a day (QID) | RESPIRATORY_TRACT | Status: DC | PRN

## 2012-11-25 MED ORDER — FLUOXETINE HCL 20 MG PO CAPS: 20.0000 mg | ORAL_CAPSULE | Freq: Every day | ORAL | Status: DC

## 2012-11-25 MED ORDER — ARIPIPRAZOLE 10 MG PO TABS: 10.0000 mg | ORAL_TABLET | Freq: Every day | ORAL | Status: DC

## 2012-11-25 MED ORDER — HYDROXYZINE HCL 50 MG PO TABS: 50.0000 mg | ORAL_TABLET | Freq: Three times a day (TID) | ORAL | Status: DC | PRN

## 2012-11-25 MED ORDER — GABAPENTIN 400 MG PO CAPS: 400.0000 mg | ORAL_CAPSULE | Freq: Three times a day (TID) | ORAL | Status: DC

## 2012-11-25 MED ORDER — SOLIFENACIN SUCCINATE 10 MG PO TABS: 10.0000 mg | ORAL_TABLET | Freq: Every day | ORAL | Status: DC

## 2012-11-25 MED ORDER — OMEPRAZOLE 20 MG PO CPDR: 20.0000 mg | DELAYED_RELEASE_CAPSULE | Freq: Two times a day (BID) | ORAL | Status: DC

## 2012-11-25 NOTE — Progress Notes (Signed)
Recreation Therapy Notes  Date: 06.18.2014  Time: 10:30am Location: [address] Dayroom     Group Topic/Focus: Self Esteem  Participation Level: Active  Participation Quality: Appropriate  Affect: Euthymic  Cognitive: Oriented   Additional Comments: Activity: Cup of Quotes; Explanation: Patients were asked to select a positive quote out of a container and relate that quote to their lives.   Patient arrived to group session at approximately 10:50am. Patient selected quote from container, however stated she was not able to read quote herself, patient asked LRT for assistance. LRT read quote for patient. Patient was able to relate quote to life. Patient contributed to discussion about the importance of health self-esteem.    Marykay Lex Suella Cogar, LRT/CTRS  Jearl Klinefelter 11/25/2012 4:36 PM

## 2012-11-25 NOTE — [facility] Suicide Risk Assessment (Signed)
[facility] INPATIENT:  Family/Significant Other Suicide Prevention Education  Suicide Prevention Education:  Contact Attempts: Renold Don - son [phone]), (name of family member/significant other) has been identified by the patient as the family member/significant other with whom the patient will be residing, and identified as the person(s) who will aid the patient in the event of a mental health crisis.  With written consent from the patient, two attempts were made to provide suicide prevention education, prior to and/or following the patient's discharge.  We were unsuccessful in providing suicide prevention education.  A suicide education pamphlet was given to the patient to share with family/significant other.  Date and time of first attempt: 11/25/12 @ 10:25 am Date and time of second attempt: 11/25/12 @ 11:45 am  Horton, Salome Arnt 11/25/2012, 11:43 AM

## 2012-11-25 NOTE — [facility] Group Notes (Signed)
[facility] LCSW Aftercare Discharge Planning Group Note   11/25/2012 8:45 AM  Participation Quality:  Alert and Appropriate   Mood/Affect:  Appropriate but Flat   Depression Rating:  0  Anxiety Rating:  5  Thoughts of Suicide:  Pt denies SI/HI  Will you contract for safety?   Yes  Current AVH:  Pt denies AVH  Plan for Discharge/Comments:  Pt attended discharge planning group and actively participated in group.  CSW provided pt with today's workbook.  Pt states that she is returning home in [place].  Pt has follow up scheduled at [facility] for medication management and therapy.  No further needs voiced by pt at this time.    Transportation Means: Pt states that her son will pick her up today  Supports: Pt names her son as a support  Reyes Ivan, LCSWA 11/25/2012 10:37 AM

## 2012-11-25 NOTE — [facility] Group Notes (Signed)
[facility] LCSW Group Therapy  11/25/2012  1:15 PM   Type of Therapy:  Group Therapy  Participation Level:  Active  Participation Quality:  Appropriate and Attentive  Affect:  Appropriate and Flat  Cognitive:  Alert and Appropriate  Insight:  Developing/Improving  Engagement in Therapy:  Developing/Improving  Modes of Intervention:  Activity, Clarification, Discussion, Education, Exploration, Limit-setting, Orientation, Problem-solving, Rapport Building, Dance movement psychotherapist, Socialization and Support  Summary of Progress/Problems: Patient was attentive and engaged with speaker from [employer].  Presenter shared his story in regards to mental health and played the guitar for the group.  Patient expressed interest in their programs and services and received information on their agency. Patient was attentive and actively listened to presenter.      Reyes Ivan, LCSWA 11/25/2012 2:54 PM

## 2012-11-25 NOTE — Progress Notes (Signed)
[name], female   DOB: June 28, 1955, 57 y.o.   MRN: [id number]  D: When asked about her day pt stated , "It went good, real good." Stated, "2 of my friends went home today".  Pt stated she didn't go outside today, that she spent the time laying down.   A:  Support and encouragement was offered. 15 min checks continued for safety.  R: Pt remains safe.

## 2012-11-25 NOTE — Tx Team (Signed)
Interdisciplinary Treatment Plan Update (Adult)  Date: 11/25/2012  Time Reviewed:  9:45 AM  Progress in Treatment: Attending groups: Yes Participating in groups:  Yes Taking medication as prescribed:  Yes Tolerating medication:  Yes Family/Significant othe contact made: CSW assessing Patient understands diagnosis:  Yes Discussing patient identified problems/goals with staff:  Yes Medical problems stabilized or resolved:  Yes Denies suicidal/homicidal ideation: Yes Issues/concerns per patient self-inventory:  Yes Other:  New problem(s) identified: N/A  Discharge Plan or Barriers: Pt will follow up at [facility] for medication management and therapy.    Reason for Continuation of Hospitalization: Stable to d/c  Comments: N/A  Estimated length of stay: D/C today  For review of initial/current patient goals, please see plan of care.  Attendees: Patient:  Cynthia Deleon  11/25/2012 9:48 AM   Family:     Physician:  Dr. Thedore Mins 11/25/2012 9:48 AM   Nursing:   Burnetta Sabin, RN 11/25/2012 9:48 AM   Clinical Social Worker:  Reyes Ivan, LCSWA 11/25/2012 9:48 AM   Other: Liborio Nixon, RN 11/25/2012 9:48 AM   Other:     Other:     Other:     Other:    Other:    Other:    Other:    Other:    Other:     Scribe for Treatment Team:   Carmina Miller, 11/25/2012 9:48 AM
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
## 2012-11-25 NOTE — Progress Notes (Signed)
[facility] Adult Case Management Discharge Plan :  Will you be returning to the same living situation after discharge: Yes,  returning home At discharge, do you have transportation home?:Yes,  son will pick pt up Do you have the ability to pay for your medications:Yes,  access to meds  Release of information consent forms completed and in the chart;  Patient's signature needed at discharge.  Patient to Follow up at: Follow-up Information   Follow up with Arna Medici On 11/27/2012. (Walk in on this date between 7:45 - 11 am for hospital discharge appointment.  Referral # [id number])    Contact information:   Physical Address: [address], [place], [place] [address]    Mailing Address: [address] [place], [place] [address] Phone: [phone] Fax: [phone]      Patient denies SI/HI:   Yes,  denies SI/HI    Safety Planning and Suicide Prevention discussed:  Yes,  discussed with pt.  Unable to reach pt's son at the time of this note.  (See suicide prevention note)  Horton, Salome Arnt 11/25/2012, 11:41 AM

## 2012-11-25 NOTE — Progress Notes (Signed)
D: Patient verbalizes readiness for discharge: Denies SI/HI, is not psychotic or delusional. A: Discharge instructions read and discussed with patient. All belongings returned to pt. R: Pt verbalized understanding of discharge instructions. Signed for return of belongings. A: Escorted to the lobby where pt's son was waiting to take her home.

## 2012-11-25 NOTE — Progress Notes (Signed)
Seen and agreed. Tanica Gaige, MD 

## 2012-11-25 NOTE — [facility] Suicide Risk Assessment (Signed)
Suicide Risk Assessment  Discharge Assessment     Demographic Factors:  Low socioeconomic status, Unemployed and female  Mental Status Per Nursing Assessment::   On Admission:     Current Mental Status by Physician: patient denies suicidal ideation, intent or plan  Loss Factors: Financial problems/change in socioeconomic status  Historical Factors: Impulsivity  Risk Reduction Factors:   Sense of responsibility to family, Living with another person, especially a relative and Positive social support  Continued Clinical Symptoms:  Bipolar Disorder:   Depressive phase Previous Psychiatric Diagnoses and Treatments  Cognitive Features That Contribute To Risk:  Closed-mindedness Polarized thinking    Suicide Risk:  Minimal: No identifiable suicidal ideation.  Patients presenting with no risk factors but with morbid ruminations; may be classified as minimal risk based on the severity of the depressive symptoms  Discharge Diagnoses:   AXIS I:  Bipolar, Depressed and Post Traumatic Stress Disorder AXIS II:  Deferred AXIS III:   Past Medical History  Diagnosis Date  . Bipolar 1 disorder Degenerative disk disease  . Arthritis     hands and knees  . Panic attacks   . Hypertension   . Chronic back pain   . Incontinence   . Chronic neck pain   . Multiple personality disorder    AXIS IV:  other psychosocial or environmental problems and problems related to social environment AXIS V:  61-70 mild symptoms  Plan Of Care/Follow-up recommendations:  Activity:  as tolerated Diet:  healthy Tests:  routine Other:  patient to keep her after care appointment  Is patient on multiple antipsychotic therapies at discharge:  No   Has Patient had three or more failed trials of antipsychotic monotherapy by history:  No  Recommended Plan for Multiple Antipsychotic Therapies: N/A  Jorryn Hershberger,MD 11/25/2012, 9:59 AM

## 2012-11-27 NOTE — Discharge Summary (Signed)
Seen and agreed. Aysha Livecchi, MD 

## 2012-11-30 NOTE — Progress Notes (Signed)
Patient Discharge Instructions:  After Visit Summary (AVS):   Faxed to:  11/30/12 Discharge Summary Note:   Faxed to:  11/30/12 Psychiatric Admission Assessment Note:   Faxed to:  11/30/12 Suicide Risk Assessment - Discharge Assessment:   Faxed to:  11/30/12 Faxed/Sent to the Next Level Care provider:  11/30/12 Faxed to [facility] @ [phone]  Jerelene Redden, 11/30/2012, 4:15 PM

## 2012-12-08 ENCOUNTER — Encounter (HOSPITAL_BASED_OUTPATIENT_CLINIC_OR_DEPARTMENT_OTHER): Payer: Self-pay | Admitting: *Deleted

## 2012-12-08 NOTE — Progress Notes (Signed)
Pt is bipolar and EXTREMELY anxious. She would like something to relax her when she gets here-has been on a lot of meds and a chronic pain pt on daily oxy IR. To take her am dose with all her meds with water prior to coming-sister will be with her. He was scared after last surgery AP when she broke her toe going to bathroom in [place]

## 2012-12-09 ENCOUNTER — Other Ambulatory Visit: Payer: Self-pay | Admitting: Orthopedic Surgery

## 2012-12-10 ENCOUNTER — Encounter (HOSPITAL_BASED_OUTPATIENT_CLINIC_OR_DEPARTMENT_OTHER): Payer: Self-pay | Admitting: Certified Registered Nurse Anesthetist

## 2012-12-10 ENCOUNTER — Ambulatory Visit (HOSPITAL_BASED_OUTPATIENT_CLINIC_OR_DEPARTMENT_OTHER): Payer: Medicaid Other | Admitting: Certified Registered Nurse Anesthetist

## 2012-12-10 ENCOUNTER — Ambulatory Visit (HOSPITAL_BASED_OUTPATIENT_CLINIC_OR_DEPARTMENT_OTHER)
Admission: RE | Admit: 2012-12-10 | Discharge: 2012-12-10 | Disposition: A | Discharge status: Home / Self Care | Payer: Medicaid Other | Source: Ambulatory Visit | Attending: Orthopedic Surgery | Admitting: Orthopedic Surgery

## 2012-12-10 ENCOUNTER — Encounter (HOSPITAL_BASED_OUTPATIENT_CLINIC_OR_DEPARTMENT_OTHER)
Admission: RE | Disposition: A | Discharge status: Home / Self Care | Payer: Self-pay | Source: Ambulatory Visit | Attending: Orthopedic Surgery

## 2012-12-10 DIAGNOSIS — F3130 Bipolar disorder, current episode depressed, mild or moderate severity, unspecified: Secondary | ICD-10-CM | POA: Insufficient documentation

## 2012-12-10 DIAGNOSIS — M542 Cervicalgia: Secondary | ICD-10-CM | POA: Insufficient documentation

## 2012-12-10 DIAGNOSIS — Z882 Allergy status to sulfonamides status: Secondary | ICD-10-CM | POA: Insufficient documentation

## 2012-12-10 DIAGNOSIS — Z87891 Personal history of nicotine dependence: Secondary | ICD-10-CM | POA: Insufficient documentation

## 2012-12-10 DIAGNOSIS — M549 Dorsalgia, unspecified: Secondary | ICD-10-CM | POA: Insufficient documentation

## 2012-12-10 DIAGNOSIS — Z79899 Other long term (current) drug therapy: Secondary | ICD-10-CM | POA: Insufficient documentation

## 2012-12-10 DIAGNOSIS — T84498A Other mechanical complication of other internal orthopedic devices, implants and grafts, initial encounter: Secondary | ICD-10-CM | POA: Insufficient documentation

## 2012-12-10 DIAGNOSIS — G4730 Sleep apnea, unspecified: Secondary | ICD-10-CM | POA: Insufficient documentation

## 2012-12-10 DIAGNOSIS — Y831 Surgical operation with implant of artificial internal device as the cause of abnormal reaction of the patient, or of later complication, without mention of misadventure at the time of the procedure: Secondary | ICD-10-CM | POA: Insufficient documentation

## 2012-12-10 DIAGNOSIS — F410 Panic disorder [episodic paroxysmal anxiety] without agoraphobia: Secondary | ICD-10-CM | POA: Insufficient documentation

## 2012-12-10 DIAGNOSIS — Z885 Allergy status to narcotic agent status: Secondary | ICD-10-CM | POA: Insufficient documentation

## 2012-12-10 DIAGNOSIS — G8929 Other chronic pain: Secondary | ICD-10-CM | POA: Insufficient documentation

## 2012-12-10 DIAGNOSIS — M129 Arthropathy, unspecified: Secondary | ICD-10-CM | POA: Insufficient documentation

## 2012-12-10 DIAGNOSIS — M2010 Hallux valgus (acquired), unspecified foot: Secondary | ICD-10-CM | POA: Insufficient documentation

## 2012-12-10 DIAGNOSIS — M2040 Other hammer toe(s) (acquired), unspecified foot: Secondary | ICD-10-CM | POA: Insufficient documentation

## 2012-12-10 DIAGNOSIS — Z888 Allergy status to other drugs, medicaments and biological substances status: Secondary | ICD-10-CM | POA: Insufficient documentation

## 2012-12-10 DIAGNOSIS — F4481 Dissociative identity disorder: Secondary | ICD-10-CM | POA: Insufficient documentation

## 2012-12-10 DIAGNOSIS — M2011 Hallux valgus (acquired), right foot: Secondary | ICD-10-CM

## 2012-12-10 DIAGNOSIS — I10 Essential (primary) hypertension: Secondary | ICD-10-CM | POA: Insufficient documentation

## 2012-12-10 DIAGNOSIS — L608 Other nail disorders: Secondary | ICD-10-CM | POA: Insufficient documentation

## 2012-12-10 HISTORY — DX: Adverse effect of unspecified anesthetic, initial encounter: T41.45XA

## 2012-12-10 HISTORY — DX: Sleep apnea, unspecified: G47.30

## 2012-12-10 HISTORY — DX: Other complications of anesthesia, initial encounter: T88.59XA

## 2012-12-10 HISTORY — PX: BUNIONECTOMY WITH HAMMERTOE RECONSTRUCTION: SHX5600

## 2012-12-10 LAB — POCT HEMOGLOBIN-HEMACUE: Hemoglobin: 15 g/dL (ref 12.0–15.0)

## 2012-12-10 SURGERY — BUNIONECTOMY, WITH HAMMER TOE CORRECTION
Anesthesia: General | Site: Toe | Laterality: Right | Wound class: Clean

## 2012-12-10 MED ORDER — BUPIVACAINE-EPINEPHRINE PF 0.5-1:200000 % IJ SOLN
INTRAMUSCULAR | Status: DC | PRN
  Administered 2012-12-10: 30 mL

## 2012-12-10 MED ORDER — LIDOCAINE HCL (CARDIAC) 20 MG/ML IV SOLN
INTRAVENOUS | Status: DC | PRN
  Administered 2012-12-10: 30 mg via INTRAVENOUS

## 2012-12-10 MED ORDER — LACTATED RINGERS IV SOLN
INTRAVENOUS | Status: DC
  Administered 2012-12-10 (×2): via INTRAVENOUS

## 2012-12-10 MED ORDER — ONDANSETRON HCL 4 MG/2ML IJ SOLN: 4.0000 mg | Freq: Four times a day (QID) | INTRAMUSCULAR | Status: DC | PRN

## 2012-12-10 MED ORDER — SODIUM CHLORIDE 0.9 % IV SOLN: INTRAVENOUS | Status: DC

## 2012-12-10 MED ORDER — OXYCODONE HCL 5 MG/5ML PO SOLN: 5.0000 mg | Freq: Once | ORAL | Status: DC | PRN

## 2012-12-10 MED ORDER — BACITRACIN ZINC 500 UNIT/GM EX OINT
TOPICAL_OINTMENT | CUTANEOUS | Status: DC | PRN
  Administered 2012-12-10: 1 via TOPICAL

## 2012-12-10 MED ORDER — MIDAZOLAM HCL 2 MG/2ML IJ SOLN
1.0000 mg | INTRAMUSCULAR | Status: DC | PRN
  Administered 2012-12-10: 1 mg via INTRAVENOUS
  Administered 2012-12-10: 2 mg via INTRAVENOUS

## 2012-12-10 MED ORDER — FENTANYL CITRATE 0.05 MG/ML IJ SOLN
50.0000 ug | INTRAMUSCULAR | Status: DC | PRN
  Administered 2012-12-10: 100 ug via INTRAVENOUS
  Administered 2012-12-10: 50 ug via INTRAVENOUS

## 2012-12-10 MED ORDER — CEFAZOLIN SODIUM-DEXTROSE 2-3 GM-% IV SOLR
2.0000 g | INTRAVENOUS | Status: AC
  Administered 2012-12-10: 2 g via INTRAVENOUS

## 2012-12-10 MED ORDER — OXYCODONE HCL 5 MG PO TABS: 5.0000 mg | ORAL_TABLET | Freq: Once | ORAL | Status: DC | PRN

## 2012-12-10 MED ORDER — ONDANSETRON 8 MG PO TBDP
8.0000 mg | ORAL_TABLET | Freq: Once | ORAL | Status: AC
  Administered 2012-12-10: 8 mg via ORAL

## 2012-12-10 MED ORDER — DEXAMETHASONE SODIUM PHOSPHATE 10 MG/ML IJ SOLN
INTRAMUSCULAR | Status: DC | PRN
  Administered 2012-12-10: 10 mg via INTRAVENOUS

## 2012-12-10 MED ORDER — 0.9 % SODIUM CHLORIDE (POUR BTL) OPTIME
TOPICAL | Status: DC | PRN
  Administered 2012-12-10: 200 mL

## 2012-12-10 MED ORDER — PROPOFOL 10 MG/ML IV BOLUS
INTRAVENOUS | Status: DC | PRN
  Administered 2012-12-10: 240 mg via INTRAVENOUS

## 2012-12-10 MED ORDER — HYDROMORPHONE HCL 4 MG PO TABS: 4.0000 mg | ORAL_TABLET | Freq: Four times a day (QID) | ORAL | Status: DC | PRN

## 2012-12-10 MED ORDER — MIDAZOLAM HCL 5 MG/5ML IJ SOLN
INTRAMUSCULAR | Status: DC | PRN
  Administered 2012-12-10: 1 mg via INTRAVENOUS

## 2012-12-10 MED ORDER — CHLORHEXIDINE GLUCONATE 4 % EX LIQD: 60.0000 mL | Freq: Once | CUTANEOUS | Status: DC

## 2012-12-10 MED ORDER — ONDANSETRON HCL 4 MG/2ML IJ SOLN
INTRAMUSCULAR | Status: DC | PRN
  Administered 2012-12-10: 4 mg via INTRAVENOUS

## 2012-12-10 MED ORDER — HYDROMORPHONE HCL PF 1 MG/ML IJ SOLN: 0.2500 mg | INTRAMUSCULAR | Status: DC | PRN

## 2012-12-10 SURGICAL SUPPLY — 69 items
BANDAGE CONFORM 3  STR LF (GAUZE/BANDAGES/DRESSINGS) ×2 IMPLANT
BANDAGE ESMARK 6X9 LF (GAUZE/BANDAGES/DRESSINGS) IMPLANT
BIT DRILL 1.7 LOW PROFILE (BIT) ×1 IMPLANT
BLADE AVERAGE 25X9 (BLADE) ×2 IMPLANT
BLADE MINI RND TIP GREEN BEAV (BLADE) ×2 IMPLANT
BLADE OSC/SAG .038X5.5 CUT EDG (BLADE) ×1 IMPLANT
BLADE SURG 15 STRL LF DISP TIS (BLADE) ×3 IMPLANT
BLADE SURG 15 STRL SS (BLADE) ×6
BNDG CMPR 9X4 STRL LF SNTH (GAUZE/BANDAGES/DRESSINGS)
BNDG CMPR 9X6 STRL LF SNTH (GAUZE/BANDAGES/DRESSINGS) ×1
BNDG COHESIVE 4X5 TAN STRL (GAUZE/BANDAGES/DRESSINGS) ×2 IMPLANT
BNDG ESMARK 4X9 LF (GAUZE/BANDAGES/DRESSINGS) IMPLANT
BNDG ESMARK 6X9 LF (GAUZE/BANDAGES/DRESSINGS) ×2
CHLORAPREP W/TINT 26ML (MISCELLANEOUS) ×2 IMPLANT
CLOTH BEACON ORANGE TIMEOUT ST (SAFETY) ×2 IMPLANT
COVER TABLE BACK 60X90 (DRAPES) ×2 IMPLANT
CUFF TOURNIQUET SINGLE 18IN (TOURNIQUET CUFF) IMPLANT
DRAPE EXTREMITY T 121X128X90 (DRAPE) ×2 IMPLANT
DRAPE SURG 17X23 STRL (DRAPES) IMPLANT
DRSG EMULSION OIL 3X3 NADH (GAUZE/BANDAGES/DRESSINGS) ×2 IMPLANT
ELECT REM PT RETURN 9FT ADLT (ELECTROSURGICAL) ×2
ELECTRODE REM PT RTRN 9FT ADLT (ELECTROSURGICAL) ×1 IMPLANT
GAUZE SPONGE 4X4 16PLY XRAY LF (GAUZE/BANDAGES/DRESSINGS) IMPLANT
GLOVE BIO SURGEON STRL SZ8 (GLOVE) ×2 IMPLANT
GLOVE BIOGEL PI IND STRL 8 (GLOVE) ×1 IMPLANT
GLOVE BIOGEL PI INDICATOR 8 (GLOVE) ×1
GLOVE EXAM NITRILE MD LF STRL (GLOVE) ×1 IMPLANT
GOWN PREVENTION PLUS XLARGE (GOWN DISPOSABLE) ×2 IMPLANT
GOWN PREVENTION PLUS XXLARGE (GOWN DISPOSABLE) ×2 IMPLANT
K-WIRE .054X4 (WIRE) ×3 IMPLANT
K-WIRE 102X1.4 (WIRE) IMPLANT
KWIRE 4.0 X .045IN (WIRE) IMPLANT
NDL HYPO 25X1 1.5 SAFETY (NEEDLE) IMPLANT
NEEDLE HYPO 22GX1.5 SAFETY (NEEDLE) IMPLANT
NEEDLE HYPO 25X1 1.5 SAFETY (NEEDLE) IMPLANT
NS IRRIG 1000ML POUR BTL (IV SOLUTION) ×2 IMPLANT
PACK BASIN DAY SURGERY FS (CUSTOM PROCEDURE TRAY) ×2 IMPLANT
PAD CAST 4YDX4 CTTN HI CHSV (CAST SUPPLIES) ×1 IMPLANT
PADDING CAST ABS 4INX4YD NS (CAST SUPPLIES) ×1
PADDING CAST ABS COTTON 4X4 ST (CAST SUPPLIES) ×1 IMPLANT
PADDING CAST COTTON 4X4 STRL (CAST SUPPLIES) ×2
PENCIL BUTTON HOLSTER BLD 10FT (ELECTRODE) ×1 IMPLANT
SANITIZER HAND PURELL 535ML FO (MISCELLANEOUS) ×2 IMPLANT
SCREW CORTICAL 2.3X20 (Screw) ×1 IMPLANT
SCREW QUICK FIX 2X12 (Screw) ×1 IMPLANT
SHEET MEDIUM DRAPE 40X70 STRL (DRAPES) ×2 IMPLANT
SPLINT FAST PLASTER 5X30 (CAST SUPPLIES)
SPLINT PLASTER CAST FAST 5X30 (CAST SUPPLIES) IMPLANT
SPONGE GAUZE 4X4 12PLY (GAUZE/BANDAGES/DRESSINGS) ×2 IMPLANT
SPONGE LAP 18X18 X RAY DECT (DISPOSABLE) ×2 IMPLANT
STOCKINETTE 6  STRL (DRAPES) ×1
STOCKINETTE 6 STRL (DRAPES) ×1 IMPLANT
SUCTION FRAZIER TIP 10 FR DISP (SUCTIONS) ×2 IMPLANT
SUT ETHILON 3 0 FSL (SUTURE) IMPLANT
SUT ETHILON 3 0 PS 1 (SUTURE) ×3 IMPLANT
SUT ETHILON 4 0 PS 2 18 (SUTURE) IMPLANT
SUT MNCRL AB 3-0 PS2 18 (SUTURE) ×3 IMPLANT
SUT PROLENE 3 0 PS 1 (SUTURE) ×2 IMPLANT
SUT VIC AB 0 SH 27 (SUTURE) IMPLANT
SUT VIC AB 2-0 PS2 27 (SUTURE) ×1 IMPLANT
SUT VIC AB 3-0 PS1 18 (SUTURE)
SUT VIC AB 3-0 PS1 18XBRD (SUTURE) IMPLANT
SUT VICRYL 4-0 PS2 18IN ABS (SUTURE) IMPLANT
SYR BULB 3OZ (MISCELLANEOUS) ×2 IMPLANT
SYR CONTROL 10ML LL (SYRINGE) IMPLANT
TOWEL OR 17X24 6PK STRL BLUE (TOWEL DISPOSABLE) ×2 IMPLANT
TUBE CONNECTING 20X1/4 (TUBING) IMPLANT
UNDERPAD 30X30 INCONTINENT (UNDERPADS AND DIAPERS) ×2 IMPLANT
YANKAUER SUCT BULB TIP NO VENT (SUCTIONS) IMPLANT

## 2012-12-10 NOTE — Anesthesia Procedure Notes (Addendum)
Anesthesia Regional Block:  Popliteal block  Pre-Anesthetic Checklist: ,, timeout performed, Correct Patient, Correct Site, Correct Laterality, Correct Procedure, Correct Position, site marked, Risks and benefits discussed,  Surgical consent,  Pre-op evaluation,  At surgeon's request and post-op pain management  Laterality: Right  Prep: chloraprep       Needles:  Injection technique: Single-shot  Needle Type: Echogenic Stimulator Needle          Additional Needles:  Procedures: ultrasound guided (picture in chart) and nerve stimulator Popliteal block  Nerve Stimulator or Paresthesia:  Response: plantar flexion of foot, 0.45 mA,   Additional Responses:   Narrative:  Start time: 12/10/2012 8:30 AM End time: 12/10/2012 8:39 AM Injection made incrementally with aspirations every 5 mL.  Performed by: Personally  Anesthesiologist: Dr Chaney Malling  Additional Notes: Functioning IV was confirmed and monitors were applied.  A 90mm 21ga Arrow echogenic stimulator needle was used. Sterile prep and drape,hand hygiene and sterile gloves were used.  Negative aspiration and negative test dose prior to incremental administration of local anesthetic. The patient tolerated the procedure well.  Ultrasound guidance: relevent anatomy identified, needle position confirmed, local anesthetic spread visualized around nerve(s), vascular puncture avoided.  Image printed for medical record.   Popliteal block Procedure Name: LMA Insertion Date/Time: 12/10/2012 9:07 AM Performed by: Suann Larry WOLFE Pre-anesthesia Checklist: Patient identified, Emergency Drugs available, Suction available and Patient being monitored Patient Re-evaluated:Patient Re-evaluated prior to inductionOxygen Delivery Method: Circle System Utilized Preoxygenation: Pre-oxygenation with 100% oxygen Intubation Type: IV induction Ventilation: Mask ventilation without difficulty LMA: LMA inserted LMA Size: 4.0 Number of attempts:  1 Airway Equipment and Method: bite block Placement Confirmation: positive ETCO2 Tube secured with: Tape Dental Injury: Teeth and Oropharynx as per pre-operative assessment

## 2012-12-10 NOTE — H&P (Signed)
Cynthia Deleon is an 57 y.o. female.   Chief Complaint:  Right forefoot pain HPI: 57 y/o female with PMH of multiple psychiatric issues c/o right forefoot pain for many years.  She has a painful bunion and 2nd hammertoe as well as 3rd and 4th toe dystrophic nails.  Past Medical History  Diagnosis Date  . Bipolar 1 disorder Degenerative disk disease  . Arthritis     hands and knees  . Panic attacks   . Hypertension   . Chronic back pain   . Incontinence   . Chronic neck pain   . Multiple personality disorder   . Depression   . Sleep apnea     uses a cpap-with oxygen  . Complication of anesthesia     high anxiety-does not want to be alone    Past Surgical History  Procedure Laterality Date  . Abdominal hysterectomy    . Tonsillectomy    . Foot arthrodesis  2000    both feet    History reviewed. No pertinent family history. Social History:  reports that she quit smoking about 7 weeks ago. Her smoking use included Cigarettes. She smoked 0.50 packs per day. She has never used smokeless tobacco. She reports that  drinks alcohol. She reports that she does not use illicit drugs.  Allergies:  Allergies  Allergen Reactions  . Codeine Nausea And Vomiting  . Divalproex Sodium Other (See Comments)    Hallucinations   . Sulfur     Swelling of tongue    Medications Prior to Admission  Medication Sig Dispense Refill  . albuterol (PROVENTIL HFA;VENTOLIN HFA) 108 (90 BASE) MCG/ACT inhaler Inhale 1 puff into the lungs every 6 (six) hours as needed. For shortness of breath.      . ARIPiprazole (ABILIFY) 10 MG tablet Take 1 tablet (10 mg total) by mouth daily. For mood control  30 tablet  0  . clonazePAM (KLONOPIN) 0.5 MG tablet Take 1 tablet (0.5 mg total) by mouth at bedtime. For anxiety/sleep  7 tablet  0  . doxepin (SINEQUAN) 100 MG capsule Take 1 capsule (100 mg total) by mouth at bedtime. For sleep  30 capsule  0  . FLUoxetine (PROZAC) 20 MG capsule Take 1 capsule (20 mg total)  by mouth daily. For depression  30 capsule  0  . gabapentin (NEURONTIN) 400 MG capsule Take 400 mg by mouth 3 (three) times daily. For anxiety      . hydrOXYzine (ATARAX/VISTARIL) 50 MG tablet Take 1 tablet (50 mg total) by mouth 3 (three) times daily as needed for anxiety.  120 tablet  0  . omeprazole (PRILOSEC) 20 MG capsule Take 1 capsule (20 mg total) by mouth 2 (two) times daily. For acid reflux      . oxyCODONE (ROXICODONE) 15 MG immediate release tablet Take 15 mg by mouth every 6 (six) hours as needed for pain.      . rosuvastatin (CRESTOR) 10 MG tablet Take 1 tablet (10 mg total) by mouth daily. For cholesterol control      . solifenacin (VESICARE) 10 MG tablet Take 1 tablet (10 mg total) by mouth daily. For overactive bladder  30 tablet  0    Results for orders placed during the hospital encounter of 12/10/12 (from the past 48 hour(s))  POCT HEMOGLOBIN-HEMACUE     Status: None   Collection Time    12/10/12  8:14 AM      Result Value Range   Hemoglobin 15.0  12.0 -  15.0 g/dL   No results found.  ROS  No recent f/c/n/v/wt loss  Blood pressure 134/60, pulse 103, temperature 97.8 F (36.6 C), temperature source Oral, resp. rate 14, height 5\' 9"  (1.753 m), weight 81.285 kg (179 lb 3.2 oz), SpO2 99.00%. Physical Exam  wn wd woman in nad  A and O x 4.  Mood normal.  Affect flat.  EOMI.  Resp unlabored.  R foot with bunion and 2nd hammertoe deformities.  Also with dystrophic 3rd and 4th toenails.  Skin o/w heatlhy with no lymhpadenopathy.  5/5 strength in PF and DF o the toes.  Sens to LT intact.   Assessment/Plan R hallux valgus and 2nd hammertoe and dystrophic 3rd and 4th toenails.  To OR for bunion and hammertoe correction as well as excision of 3rd and 4th nails.  The risks and benefits of the alternative treatment options have been discussed in detail.  The patient wishes to proceed with surgery and specifically understands risks of bleeding, infection, nerve damage, blood clots,  need for additional surgery, amputation and death.   Toni Arthurs 12-17-2012, 8:51 AM

## 2012-12-10 NOTE — Anesthesia Preprocedure Evaluation (Addendum)
Anesthesia Evaluation  Patient identified by MRN, date of birth, ID band Patient awake    Reviewed: Allergy & Precautions, NPO status   Airway Mallampati: II  Neck ROM: full    Dental   Pulmonary sleep apnea ,          Cardiovascular hypertension, Pt. on medications     Neuro/Psych PSYCHIATRIC DISORDERS Anxiety Depression    GI/Hepatic   Endo/Other    Renal/GU      Musculoskeletal   Abdominal   Peds  Hematology   Anesthesia Other Findings   Reproductive/Obstetrics                          Anesthesia Physical Anesthesia Plan  ASA: II  Anesthesia Plan: General   Post-op Pain Management:    Induction: Intravenous  Airway Management Planned: LMA  Additional Equipment:   Intra-op Plan:   Post-operative Plan:   Informed Consent: I have reviewed the patients History and Physical, chart, labs and discussed the procedure including the risks, benefits and alternatives for the proposed anesthesia with the patient or authorized representative who has indicated his/her understanding and acceptance.     Plan Discussed with: CRNA, Anesthesiologist and Surgeon  Anesthesia Plan Comments:         Anesthesia Quick Evaluation

## 2012-12-10 NOTE — Brief Op Note (Signed)
12/10/2012  10:54 AM  PATIENT:  Cynthia Deleon  57 y.o. female  PRE-OPERATIVE DIAGNOSIS:   RIGHT HALLUX VALGUS 2 AND 3 HAMMER TOE 3 AND 4  DYSTROPHIC TOE NAILS   POST-OPERATIVE DIAGNOSIS:   RIGHT HALLUX VALGUS 2 AND 3 HAMMER TOE 3 AND 4  DYSTROPHIC TOE NAILS   Procedure(s): 1.  Right 2nd MTPJ dorsal capsulotomy and extensor tendon lengthening. 2.  Right 2nd MT removal of deep implant 3.  Right 2nd hammertoe correction 4.  Right 3rd MT weil osteotomy 5.  Right 3rd MTPJ dorsal capsulotomy and extensor tendon lengthening 6.  Right 3rd hammertoe correction 7.  Right 3rd toenail and matrix excision 8.  Right 4th toenail and matrix excision 9.  Right chevron bunion correction 10.  AP and Lateral radiographs of the right foot  SURGEON:  Toni Arthurs, MD  ASSISTANT: n/a  ANESTHESIA:   General, regional  EBL:  minimal   TOURNIQUET:   Total Tourniquet Time Documented: Thigh (Right) - 76 minutes Total: Thigh (Right) - 76 minutes   COMPLICATIONS:  None apparent  DISPOSITION:  Extubated, awake and stable to recovery.  DICTATION ID:  [id number]

## 2012-12-10 NOTE — Anesthesia Postprocedure Evaluation (Signed)
  Anesthesia Post-op Note  Patient: Cynthia Deleon  Procedure(s) Performed: Procedure(s): RIGHT FIRST METATARSAL CHEVRON BUNION CORRECTION,  2 AND 3 HAMMERTOE CORRECTION , RIGHT 3 AND 4 TOE NAIL EXCISION  (Right)  Patient Location: PACU  Anesthesia Type:GA combined with regional for post-op pain  Level of Consciousness: awake, alert  and oriented  Airway and Oxygen Therapy: Patient Spontanous Breathing  Post-op Pain: mild  Post-op Assessment: Post-op Vital signs reviewed  Post-op Vital Signs: Reviewed  Complications: No apparent anesthesia complications

## 2012-12-10 NOTE — Transfer of Care (Signed)
Immediate Anesthesia Transfer of Care Note  Patient: Cynthia Deleon  Procedure(s) Performed: Procedure(s): RIGHT FIRST METATARSAL CHEVRON BUNION CORRECTION,  2 AND 3 HAMMERTOE CORRECTION , RIGHT 3 AND 4 TOE NAIL EXCISION  (Right)  Patient Location: PACU  Anesthesia Type:GA combined with regional for post-op pain  Level of Consciousness: awake and patient cooperative  Airway & Oxygen Therapy: Patient Spontanous Breathing and Patient connected to face mask oxygen  Post-op Assessment: Report given to PACU RN and Post -op Vital signs reviewed and stable  Post vital signs: Reviewed and stable  Complications: No apparent anesthesia complications

## 2012-12-10 NOTE — Progress Notes (Signed)
Assisted Dr. Hodierne with right, ultrasound guided, popliteal/saphenous block. Side rails up, monitors on throughout procedure. See vital signs in flow sheet. Tolerated Procedure well. 

## 2012-12-10 NOTE — Op Note (Signed)
NAME:  Cynthia Deleon, CAMUS NO.:  [id number]  MEDICAL RECORD NO.:  [id number]  LOCATION:                                 FACILITY:  PHYSICIAN:  Toni Arthurs, MD        DATE OF BIRTH:  1956/02/13  DATE OF PROCEDURE:  12/10/2012 DATE OF DISCHARGE:                              OPERATIVE REPORT   PREOPERATIVE DIAGNOSES: 1. Right hallux valgus. 2. 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 3. Right 3rd hammertoe deformity. 4. Right 3rd toe dystrophic toenail. 5. Right fourth toe dystrophic toenail.  POSTOPERATIVE DIAGNOSES: 1. Right hallux valgus. 2. 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 3. Right 3rd hammertoe deformity. 4. Right 3rd toe dystrophic toenail. 5. Right fourth toe dystrophic toenail.  PROCEDURE: 1. Right 2nd MTP joint dorsal capsulotomies and extensor tendon     lengthening. 2. Right 2nd metatarsal removal of deep implant. 3. Right 2nd hammertoe correction. 4. Right 3rd metatarsal Weil osteotomy. 5. Right 3rd metatarsophalangeal joint dorsal capsulotomy and extensor     tendon lengthening. 6. Right 3rd hammertoe correction. 7. Right 3rd toe nail and matrix excision. 8. Right fourth toe nail and matrix excision. 9. Right chevron bunion correction. 10.AP and lateral radiographs of the right foot.  SURGEON:  Toni Arthurs, MD.  ANESTHESIA:  General, regional.  ESTIMATED BLOOD LOSS:  Minimal.  TOURNIQUET TIME:  76 minutes at 220 mmHg.  COMPLICATIONS:  None apparent.  DISPOSITION:  Extubated, awake, and stable to recovery.  INDICATIONS FOR PROCEDURE:  The patient is a 57 year old woman with painful right forefoot deformities.  She has had previous correction of her 2nd hammertoe and now has a floating toe.  She has 3rd hammertoe as well as a bunion deformity and dystrophic 3rd and 4th toenails.  She presents now for operative treatment of these conditions.  She understands the risks and benefits, the alternative treatment options and elects  surgical treatment.  She specifically understands risks of bleeding, infection, nerve damage, blood clots, need for additional surgery, recurrence of her deformity, amputation, and death.  PROCEDURE IN DETAIL:  After preoperative consent was obtained, the correct operative site was identified.  The patient was brought to the operating room and placed supine on the operating table.  General anesthesia was induced.  Preoperative antibiotics were administered. Surgical time-out was taken.  The right lower extremity was prepped and draped in standard sterile fashion tourniquet around the thigh.  The extremity was exsanguinated and tourniquet was inflated to 220 mmHg.  A longitudinal incision was made over the 2nd MTP joint.  Sharp dissection was carried down through the skin and subcutaneous tissue.  The extensor tendons were noted to be quite tight and scarred down to the dorsal aspect of the second metatarsal.  The extensor digitorum brevis and longus tendons were lengthened in Z fashion.  The second metatarsal screw was identified.  It was cleaned of all bone and removed in its entirety.  Dorsal osteophytes were excised allowing a gliding surface for the extensor tendons.  The dorsal joint capsule was excised as well. This allowed passive correction of the toe to a neutral position.  A transverse incision was made over the PIP joint.  The head  of the proximal phalanx was excised with a sagittal saw, followed by the base of the middle phalanx.  The toe was held in a straightened position as a K-wire was driven across the IP joints and the MP joint holding the toe in a reduced position.  Attention was then turned to the third toe where a dorsal incision was made.  Sharp dissection was carried down through the skin and subcutaneous tissue.  The extensor tendons were again lengthen and the dorsal joint capsule excised as described previously.  A transverse incision was then made over the PIP  joint.  The head of the proximal phalanx was excised along with the base of the middle phalanx.  Again, a K-wire was driven across the IP joints and across the MP joint holding the toe in a straight reduced position.  This was done after a Weil osteotomy was made at the 3rd metatarsal head.  A small wafer of bone was removed distally and the head was allowed to retract proximally a couple of millimeters.  It was fixed with a 2-mm partially threaded screw.  The overhanging bone was trimmed with a rongeur.  The extensor tendons were then repaired.  The subcutaneous tissue was approximated with inverted simple sutures of 3-0 Monocryl and the 2 dorsal incisions were closed with a running 3-0 nylon sutures.  The 2 pins were bent, trimmed, and capped.  Transverse incisions were closed with horizontal mattress sutures of 3-0 nylon.  Attention was then turned to the medial aspect of the forefoot where longitudinal incision was made over the patient's medial eminence. Sharp dissection was carried down through the skin and subcutaneous tissue.  The medial joint capsule was incised in line with its fibers and elevated plantarly and dorsally exposing the medial eminence.  The medial eminence was resected with an oscillating saw.  The lateral joint capsule was incised longitudinally between the lateral sesamoid and the metatarsal head.  Several small perforations were made in the joint capsule in line with the MTP joint.  The hallux MP joint could then be passively corrected to about 20 degrees of varus.  The adductor tendon was left intact.  Chevron osteotomy was then made in the metatarsal head using a K-wire as a guide.  The head was translated laterally to correct the intermetatarsal and hallux valgus angles.  The osteotomy was fixed with a 2.7-mm solid screw from the Arthrex quick fix set.  The overhanging bone medially was then trimmed with an oscillating saw. Final AP and lateral fluoroscopic  images confirmed appropriate correction of the bunion as well as the 2nd and 3rd hammertoes.  The 3rd and 4th toenails were removed in their entirety.  The germinal matrix was excised with a scalpel.  The area was rongeured to remove all of the germinal matrix.  The flap of skin was repaired with 3-0 Monocryl box sutures.  Sterile dressings were applied followed by a bunion dressing and a compression dressing.  The tourniquet was released at 76 minutes.  The patient was then awakened from anesthesia and transported to recovery room in stable condition.  FOLLOWUP PLAN:  The patient will be weightbearing as tolerated in a Darco shoe.  She will follow up with me in 2 weeks for suture removal.  RADIOGRAPHS:  AP and lateral radiographs of the right foot were obtained intraoperatively today simulating weightbearing.  This showed correction of the bunion deformity, as well as the 2nd and 3rd hammertoe deformities.  Pins were crossed the IP joints  of the 2nd and 3rd toes. There was a screw removed from the second metatarsal and screw placed in the first metatarsal and the third metatarsal.  No fracture or dislocation is noted.     Toni Arthurs, MD     JH/MEDQ  D:  12/10/2012  T:  12/10/2012  Job:  [id number]

## 2012-12-16 ENCOUNTER — Encounter (HOSPITAL_BASED_OUTPATIENT_CLINIC_OR_DEPARTMENT_OTHER): Payer: Self-pay | Admitting: Orthopedic Surgery

## 2012-12-17 ENCOUNTER — Emergency Department (HOSPITAL_COMMUNITY): Payer: MEDICAID

## 2012-12-17 ENCOUNTER — Encounter (HOSPITAL_COMMUNITY): Payer: Self-pay

## 2012-12-17 ENCOUNTER — Emergency Department (HOSPITAL_COMMUNITY)
Admission: EM | Admit: 2012-12-17 | Discharge: 2012-12-17 | Disposition: A | Discharge status: Home / Self Care | Payer: MEDICAID | Attending: Emergency Medicine | Admitting: Emergency Medicine

## 2012-12-17 DIAGNOSIS — Z87891 Personal history of nicotine dependence: Secondary | ICD-10-CM | POA: Insufficient documentation

## 2012-12-17 DIAGNOSIS — R51 Headache: Secondary | ICD-10-CM | POA: Insufficient documentation

## 2012-12-17 DIAGNOSIS — R21 Rash and other nonspecific skin eruption: Secondary | ICD-10-CM | POA: Insufficient documentation

## 2012-12-17 DIAGNOSIS — J3489 Other specified disorders of nose and nasal sinuses: Secondary | ICD-10-CM | POA: Insufficient documentation

## 2012-12-17 DIAGNOSIS — G8929 Other chronic pain: Secondary | ICD-10-CM | POA: Insufficient documentation

## 2012-12-17 DIAGNOSIS — G4730 Sleep apnea, unspecified: Secondary | ICD-10-CM | POA: Insufficient documentation

## 2012-12-17 DIAGNOSIS — R3919 Other difficulties with micturition: Secondary | ICD-10-CM | POA: Insufficient documentation

## 2012-12-17 DIAGNOSIS — Z8739 Personal history of other diseases of the musculoskeletal system and connective tissue: Secondary | ICD-10-CM | POA: Insufficient documentation

## 2012-12-17 DIAGNOSIS — R059 Cough, unspecified: Secondary | ICD-10-CM | POA: Insufficient documentation

## 2012-12-17 DIAGNOSIS — R062 Wheezing: Secondary | ICD-10-CM | POA: Insufficient documentation

## 2012-12-17 DIAGNOSIS — Z79899 Other long term (current) drug therapy: Secondary | ICD-10-CM | POA: Insufficient documentation

## 2012-12-17 DIAGNOSIS — J4489 Other specified chronic obstructive pulmonary disease: Secondary | ICD-10-CM | POA: Insufficient documentation

## 2012-12-17 DIAGNOSIS — F319 Bipolar disorder, unspecified: Secondary | ICD-10-CM | POA: Insufficient documentation

## 2012-12-17 DIAGNOSIS — J449 Chronic obstructive pulmonary disease, unspecified: Secondary | ICD-10-CM

## 2012-12-17 DIAGNOSIS — J189 Pneumonia, unspecified organism: Secondary | ICD-10-CM | POA: Insufficient documentation

## 2012-12-17 DIAGNOSIS — F410 Panic disorder [episodic paroxysmal anxiety] without agoraphobia: Secondary | ICD-10-CM | POA: Insufficient documentation

## 2012-12-17 DIAGNOSIS — I10 Essential (primary) hypertension: Secondary | ICD-10-CM | POA: Insufficient documentation

## 2012-12-17 DIAGNOSIS — F1910 Other psychoactive substance abuse, uncomplicated: Secondary | ICD-10-CM

## 2012-12-17 DIAGNOSIS — Z8659 Personal history of other mental and behavioral disorders: Secondary | ICD-10-CM | POA: Insufficient documentation

## 2012-12-17 DIAGNOSIS — R05 Cough: Secondary | ICD-10-CM | POA: Insufficient documentation

## 2012-12-17 LAB — URINALYSIS, ROUTINE W REFLEX MICROSCOPIC
Bilirubin Urine: NEGATIVE
Leukocytes, UA: NEGATIVE
Nitrite: NEGATIVE
Specific Gravity, Urine: 1.02 (ref 1.005–1.030)
pH: 6 (ref 5.0–8.0)

## 2012-12-17 LAB — CBC WITH DIFFERENTIAL/PLATELET
Hemoglobin: 14.3 g/dL (ref 12.0–15.0)
Lymphocytes Relative: 33 % (ref 12–46)
Lymphs Abs: 2.3 10*3/uL (ref 0.7–4.0)
Monocytes Relative: 7 % (ref 3–12)
Neutro Abs: 4 10*3/uL (ref 1.7–7.7)
Neutrophils Relative %: 57 % (ref 43–77)
Platelets: 254 10*3/uL (ref 150–400)
RBC: 4.55 MIL/uL (ref 3.87–5.11)
WBC: 7 10*3/uL (ref 4.0–10.5)

## 2012-12-17 LAB — BASIC METABOLIC PANEL
BUN: 11 mg/dL (ref 6–23)
CO2: 32 mEq/L (ref 19–32)
Calcium: 9.2 mg/dL (ref 8.4–10.5)
Creatinine, Ser: 0.71 mg/dL (ref 0.50–1.10)

## 2012-12-17 LAB — ETHANOL: Alcohol, Ethyl (B): <11 mg/dL (ref 0–11)

## 2012-12-17 LAB — RAPID URINE DRUG SCREEN, HOSP PERFORMED
Opiates: POSITIVE — AB
Tetrahydrocannabinol: NOT DETECTED

## 2012-12-17 MED ORDER — LEVOFLOXACIN 750 MG PO TABS
750.0000 mg | ORAL_TABLET | Freq: Once | ORAL | Status: AC
  Administered 2012-12-17: 750 mg via ORAL
  Filled 2012-12-17: qty 1

## 2012-12-17 MED ORDER — IPRATROPIUM BROMIDE 0.02 % IN SOLN
0.5000 mg | Freq: Once | RESPIRATORY_TRACT | Status: AC
  Administered 2012-12-17: 0.5 mg via RESPIRATORY_TRACT
  Filled 2012-12-17: qty 2.5

## 2012-12-17 MED ORDER — LEVOFLOXACIN 500 MG PO TABS: 750.0000 mg | ORAL_TABLET | Freq: Every day | ORAL | Status: DC

## 2012-12-17 MED ORDER — AZITHROMYCIN 250 MG PO TABS
500.0000 mg | ORAL_TABLET | Freq: Once | ORAL | Status: DC
  Administered 2012-12-17: 500 mg via ORAL
  Filled 2012-12-17: qty 2

## 2012-12-17 MED ORDER — ALBUTEROL SULFATE (5 MG/ML) 0.5% IN NEBU
2.5000 mg | INHALATION_SOLUTION | Freq: Once | RESPIRATORY_TRACT | Status: AC
  Administered 2012-12-17: 2.5 mg via RESPIRATORY_TRACT
  Filled 2012-12-17: qty 0.5

## 2012-12-17 MED ORDER — HYDROMORPHONE HCL 2 MG PO TABS: 4.0000 mg | ORAL_TABLET | Freq: Once | ORAL | Status: DC

## 2012-12-17 MED ORDER — ALBUTEROL SULFATE HFA 108 (90 BASE) MCG/ACT IN AERS: 1.0000 | INHALATION_SPRAY | Freq: Four times a day (QID) | RESPIRATORY_TRACT | Status: DC | PRN

## 2012-12-17 NOTE — ED Notes (Signed)
Pt sister at bedside. Pt more alert and interactive at this time. Pt sister asked could pt eat something, Per EDP verbal pt can having something to eat and drink. Pt sister left to bring pt something to eat. Pt sister reported, on her way out, pt has been taking xanax at bedside during tx today. EDP aware. No new orders at this time. Pt informed not to take any meds from home while here in the ED. Pt verbalized understanding.

## 2012-12-17 NOTE — ED Notes (Signed)
Per EMS, pt had her neurontin increased from 300 mg to 800 mg by her pcp. Complain of tremors now that started last night

## 2012-12-17 NOTE — ED Provider Notes (Signed)
History  This chart was scribed for Shelda Jakes, MD by Bennett Scrape, ED Scribe. This patient was seen in room APA16A/APA16A and the patient's care was started at 11:52 AM.  CSN: [id number]  Arrival date & time 12/17/12  1032   First MD Initiated Contact with Patient 12/17/12 1152     Chief Complaint  Patient presents with  . Tremors    The history is provided by the patient. No language interpreter was used.   HPI Comments: Cynthia Deleon is a 57 y.o. female brought in by ambulance, who presents to the Emergency Department complaining of worsening of tremors described as trembling since last night after having her neurontin increased from 300 mg to 800 mg by psychiatrist. Pt was started on medication for depression after a behavioral health stay at [facility] at the end of June 2014 and upon discharge [facility] took over her care. Pt states that she has experienced a mild intermittent tremor over the past week but had "full blown" tremors last night and this morning. She reports that she took one Xanax PTA and denies taking any neurontin this morning.She also expresses concern over difficulty urinating which is a side effect of neurontin. She admits that she tried to call her PCP but was unable to get through. She denies SI and HI currently. Pt is currently on Roxicodone 15 mg for chronic back pain and 4 mg dilaudid for her right foot reconstruction surgery performed by [facility] on 12/10/12.  PCP is Dr. Delbert Harness Dr. Hassie Bruce is her psychiatrist with [facility]   Past Medical History  Diagnosis Date  . Bipolar 1 disorder Degenerative disk disease  . Arthritis     hands and knees  . Panic attacks   . Hypertension   . Chronic back pain   . Incontinence   . Chronic neck pain   . Multiple personality disorder   . Depression   . Sleep apnea     uses a cpap-with oxygen  . Complication of anesthesia     high anxiety-does not want to be alone   Past Surgical History   Procedure Laterality Date  . Abdominal hysterectomy    . Tonsillectomy    . Foot arthrodesis  2000    both feet  . Bunionectomy with hammertoe reconstruction Right 12/10/2012    Procedure: RIGHT FIRST METATARSAL CHEVRON BUNION CORRECTION,  2 AND 3 HAMMERTOE CORRECTION , RIGHT 3 AND 4 TOE NAIL EXCISION ;  Surgeon: Toni Arthurs, MD;  Location: [facility];  Service: Orthopedics;  Laterality: Right;   No family history on file. History  Substance Use Topics  . Smoking status: Former Smoker -- 0.50 packs/day    Types: Cigarettes    Quit date: 10/19/2012  . Smokeless tobacco: Never Used  . Alcohol Use: Yes     Comment: rarely   No OB history provided.  Review of Systems  Constitutional: Negative for fever and chills.  HENT: Positive for congestion. Negative for sore throat and neck pain.   Eyes: Negative for visual disturbance.  Respiratory: Positive for cough. Negative for shortness of breath.   Cardiovascular: Negative for chest pain and leg swelling.  Gastrointestinal: Negative for nausea, vomiting, abdominal pain and diarrhea.  Genitourinary: Positive for difficulty urinating. Negative for dysuria.  Musculoskeletal: Negative for back pain.  Skin: Positive for rash (on face and chest, currently improving).  Neurological: Positive for tremors and headaches (now resolved ). Negative for dizziness.  Hematological: Does not bruise/bleed easily.  Psychiatric/Behavioral: Negative for suicidal ideas and confusion.    Allergies  Codeine; Divalproex sodium; and Sulfur  Home Medications   Current Outpatient Rx  Name  Route  Sig  Dispense  Refill  . albuterol (PROVENTIL HFA;VENTOLIN HFA) 108 (90 BASE) MCG/ACT inhaler   Inhalation   Inhale 1 puff into the lungs every 6 (six) hours as needed. For shortness of breath.         . ARIPiprazole (ABILIFY) 10 MG tablet   Oral   Take 1 tablet (10 mg total) by mouth daily. For mood control   30 tablet   0   . clonazePAM  (KLONOPIN) 0.5 MG tablet   Oral   Take 1 tablet (0.5 mg total) by mouth at bedtime. For anxiety/sleep   7 tablet   0   . doxepin (SINEQUAN) 100 MG capsule   Oral   Take 1 capsule (100 mg total) by mouth at bedtime. For sleep   30 capsule   0   . FLUoxetine (PROZAC) 20 MG capsule   Oral   Take 1 capsule (20 mg total) by mouth daily. For depression   30 capsule   0   . gabapentin (NEURONTIN) 400 MG capsule   Oral   Take 400 mg by mouth 3 (three) times daily. For anxiety         . HYDROmorphone (DILAUDID) 4 MG tablet   Oral   Take 1-2 tablets (4-8 mg total) by mouth every 6 (six) hours as needed for pain.   40 tablet   0   . hydrOXYzine (ATARAX/VISTARIL) 50 MG tablet   Oral   Take 1 tablet (50 mg total) by mouth 3 (three) times daily as needed for anxiety.   120 tablet   0   . omeprazole (PRILOSEC) 20 MG capsule   Oral   Take 1 capsule (20 mg total) by mouth 2 (two) times daily. For acid reflux         . rosuvastatin (CRESTOR) 10 MG tablet   Oral   Take 1 tablet (10 mg total) by mouth daily. For cholesterol control         . solifenacin (VESICARE) 10 MG tablet   Oral   Take 1 tablet (10 mg total) by mouth daily. For overactive bladder   30 tablet   0   . albuterol (PROVENTIL HFA;VENTOLIN HFA) 108 (90 BASE) MCG/ACT inhaler   Inhalation   Inhale 1-2 puffs into the lungs every 6 (six) hours as needed for wheezing.   1 Inhaler   0   . levofloxacin (LEVAQUIN) 500 MG tablet   Oral   Take 1.5 tablets (750 mg total) by mouth daily.   7 tablet   0    Triage Vitals: BP 112/53  Pulse 84  Temp(Src) 98 F (36.7 C) (Oral)  Resp 18  Ht 5' 8.5" (1.74 m)  Wt 170 lb (77.111 kg)  BMI 25.47 kg/m2  SpO2 93%  Physical Exam  Nursing note and vitals reviewed. Constitutional: She is oriented to person, place, and time. She appears well-developed and well-nourished. No distress.  HENT:  Head: Normocephalic and atraumatic.  Dry MM  Eyes: Conjunctivae and EOM are  normal. Pupils are equal, round, and reactive to light.  Sclera are clear  Neck: Neck supple. No tracheal deviation present.  Cardiovascular: Normal rate and regular rhythm.   No murmur heard. Pulses:      Dorsalis pedis pulses are 2+ on the right side, and 2+ on the  left side.  Pulmonary/Chest: Effort normal. No respiratory distress. She has wheezes (bilateral).  Abdominal: Soft. Bowel sounds are normal. She exhibits no distension. There is no tenderness.  Musculoskeletal: She exhibits no edema (no ankle swelling).  Surgical boot on right foot, surgical pins on end of right second toe  Lymphadenopathy:    She has no cervical adenopathy.  Neurological: She is alert and oriented to person, place, and time. No cranial nerve deficit.  Pt able to move both sets of fingers and toes, mild tremor noted to bilateral hands  Skin: Skin is warm and dry. No rash noted.  Psychiatric: She has a normal mood and affect. Her behavior is normal.    ED Course  Procedures (including critical care time)  DIAGNOSTIC STUDIES: Oxygen Saturation is 93% on room air, adequate by my interpretation.    COORDINATION OF CARE: 12:18 PM-Discussed treatment plan which includes stopping the neurontin with pt at bedside and pt agreed to plan. Pt is requesting to speak to a telepsychiatrist.   Labs Reviewed  BASIC METABOLIC PANEL - Abnormal; Notable for the following:    Glucose, Bld 106 (*)    All other components within normal limits  URINE RAPID DRUG SCREEN (HOSP PERFORMED) - Abnormal; Notable for the following:    Opiates POSITIVE (*)    All other components within normal limits  URINALYSIS, ROUTINE W REFLEX MICROSCOPIC  CBC WITH DIFFERENTIAL  ETHANOL   Dg Chest 2 View  12/17/2012   *RADIOLOGY REPORT*  Clinical Data: Transverse  CHEST - 2 VIEW  Comparison: Oct 21, 2005  Findings:  There is patchy left base infiltrate.  Lungs are otherwise clear.  Heart size and pulmonary vascularity are normal. No adenopathy.   No bone lesions.  Impression: Patchy infiltrate left base.   Original Report Authenticated By: Bretta Bang, M.D.   Results for orders placed during the hospital encounter of 12/17/12  URINALYSIS, ROUTINE W REFLEX MICROSCOPIC      Result Value Range   Color, Urine YELLOW  YELLOW   APPearance CLEAR  CLEAR   Specific Gravity, Urine 1.020  1.005 - 1.030   pH 6.0  5.0 - 8.0   Glucose, UA NEGATIVE  NEGATIVE mg/dL   Hgb urine dipstick NEGATIVE  NEGATIVE   Bilirubin Urine NEGATIVE  NEGATIVE   Ketones, ur NEGATIVE  NEGATIVE mg/dL   Protein, ur NEGATIVE  NEGATIVE mg/dL   Urobilinogen, UA 0.2  0.0 - 1.0 mg/dL   Nitrite NEGATIVE  NEGATIVE   Leukocytes, UA NEGATIVE  NEGATIVE  CBC WITH DIFFERENTIAL      Result Value Range   WBC 7.0  4.0 - 10.5 K/uL   RBC 4.55  3.87 - 5.11 MIL/uL   Hemoglobin 14.3  12.0 - 15.0 g/dL   HCT 16.1  09.6 - 04.5 %   MCV 92.7  78.0 - 100.0 fL   MCH 31.4  26.0 - 34.0 pg   MCHC 33.9  30.0 - 36.0 g/dL   RDW 40.9  81.1 - 91.4 %   Platelets 254  150 - 400 K/uL   Neutrophils Relative % 57  43 - 77 %   Neutro Abs 4.0  1.7 - 7.7 K/uL   Lymphocytes Relative 33  12 - 46 %   Lymphs Abs 2.3  0.7 - 4.0 K/uL   Monocytes Relative 7  3 - 12 %   Monocytes Absolute 0.5  0.1 - 1.0 K/uL   Eosinophils Relative 2  0 - 5 %   Eosinophils  Absolute 0.2  0.0 - 0.7 K/uL   Basophils Relative 1  0 - 1 %   Basophils Absolute 0.1  0.0 - 0.1 K/uL  BASIC METABOLIC PANEL      Result Value Range   Sodium 140  135 - 145 mEq/L   Potassium 4.2  3.5 - 5.1 mEq/L   Chloride 104  96 - 112 mEq/L   CO2 32  19 - 32 mEq/L   Glucose, Bld 106 (*) 70 - 99 mg/dL   BUN 11  6 - 23 mg/dL   Creatinine, Ser 7.82  0.50 - 1.10 mg/dL   Calcium 9.2  8.4 - 95.6 mg/dL   GFR calc non Af Amer >90  >90 mL/min   GFR calc Af Amer >90  >90 mL/min  URINE RAPID DRUG SCREEN (HOSP PERFORMED)      Result Value Range   Opiates POSITIVE (*) NONE DETECTED   Cocaine NONE DETECTED  NONE DETECTED   Benzodiazepines NONE  DETECTED  NONE DETECTED   Amphetamines NONE DETECTED  NONE DETECTED   Tetrahydrocannabinol NONE DETECTED  NONE DETECTED   Barbiturates NONE DETECTED  NONE DETECTED  ETHANOL      Result Value Range   Alcohol, Ethyl (B) <11  0 - 11 mg/dL     1. Bipolar 1 disorder   2. Substance abuse   3. Healthcare-associated pneumonia   4. COPD (chronic obstructive pulmonary disease)     MDM  Patient with questionable pneumonia infiltrate on chest x-ray. Patient not toxic no leukocytosis not febrile. Patient also is a smoker and probably has some COPD he did have some wheezing bilaterally which resolved with albuterol nebulizer in the emergency department. If patient is cleared by psychiatry to go home which is likely since she's not suicidal or homicidal. Will treat with Levaquin for potential pneumonia albuterol inhaler 2 puffs every 6 hours for the next 7 days. Telemedicine psychiatric consult to help with the medication adjustments the patient believes is causing her tremors. We also know that patient has been sneaking up pain medication from her pocketbook while she been here in the emergency department.  Clinically do suspect that the tremors may be related to the combination of medications that were started at the end of June while she was in behavioral health.  I personally performed the services described in this documentation, which was scribed in my presence. The recorded information has been reviewed and is accurate.     Shelda Jakes, MD 12/17/12 [phone]

## 2012-12-17 NOTE — ED Provider Notes (Signed)
Received at change of shift. Pt evaluated by Telepsych Dr. Berlin Hun: no indication for admission at this time, continue meds without change at this time, pt can go home and f/u with her mental health provider next Friday as previously scheduled. Pt agreeable with this plan.   Laray Anger, DO 12/17/12 Krist[facility] Oppen[facility]

## 2013-08-01 ENCOUNTER — Emergency Department (HOSPITAL_COMMUNITY)
Admission: EM | Admit: 2013-08-01 | Discharge: 2013-08-01 | Disposition: A | Discharge status: Home / Self Care | Payer: Medicaid Other | Attending: Emergency Medicine | Admitting: Emergency Medicine

## 2013-08-01 ENCOUNTER — Emergency Department (HOSPITAL_COMMUNITY): Payer: Medicaid Other

## 2013-08-01 ENCOUNTER — Encounter (HOSPITAL_COMMUNITY): Payer: Self-pay | Admitting: Emergency Medicine

## 2013-08-01 DIAGNOSIS — Y929 Unspecified place or not applicable: Secondary | ICD-10-CM | POA: Insufficient documentation

## 2013-08-01 DIAGNOSIS — G8929 Other chronic pain: Secondary | ICD-10-CM | POA: Insufficient documentation

## 2013-08-01 DIAGNOSIS — F329 Major depressive disorder, single episode, unspecified: Secondary | ICD-10-CM | POA: Insufficient documentation

## 2013-08-01 DIAGNOSIS — Z8739 Personal history of other diseases of the musculoskeletal system and connective tissue: Secondary | ICD-10-CM | POA: Insufficient documentation

## 2013-08-01 DIAGNOSIS — Z79899 Other long term (current) drug therapy: Secondary | ICD-10-CM | POA: Insufficient documentation

## 2013-08-01 DIAGNOSIS — Z9981 Dependence on supplemental oxygen: Secondary | ICD-10-CM | POA: Insufficient documentation

## 2013-08-01 DIAGNOSIS — M545 Low back pain, unspecified: Secondary | ICD-10-CM | POA: Insufficient documentation

## 2013-08-01 DIAGNOSIS — G4730 Sleep apnea, unspecified: Secondary | ICD-10-CM | POA: Insufficient documentation

## 2013-08-01 DIAGNOSIS — W19XXXA Unspecified fall, initial encounter: Secondary | ICD-10-CM | POA: Insufficient documentation

## 2013-08-01 DIAGNOSIS — R110 Nausea: Secondary | ICD-10-CM | POA: Insufficient documentation

## 2013-08-01 DIAGNOSIS — I10 Essential (primary) hypertension: Secondary | ICD-10-CM | POA: Insufficient documentation

## 2013-08-01 DIAGNOSIS — F4481 Dissociative identity disorder: Secondary | ICD-10-CM | POA: Insufficient documentation

## 2013-08-01 DIAGNOSIS — IMO0002 Reserved for concepts with insufficient information to code with codable children: Secondary | ICD-10-CM

## 2013-08-01 DIAGNOSIS — J4489 Other specified chronic obstructive pulmonary disease: Secondary | ICD-10-CM | POA: Insufficient documentation

## 2013-08-01 DIAGNOSIS — S6390XA Sprain of unspecified part of unspecified wrist and hand, initial encounter: Secondary | ICD-10-CM | POA: Insufficient documentation

## 2013-08-01 DIAGNOSIS — F3289 Other specified depressive episodes: Secondary | ICD-10-CM | POA: Insufficient documentation

## 2013-08-01 DIAGNOSIS — Z87448 Personal history of other diseases of urinary system: Secondary | ICD-10-CM | POA: Insufficient documentation

## 2013-08-01 DIAGNOSIS — F319 Bipolar disorder, unspecified: Secondary | ICD-10-CM | POA: Insufficient documentation

## 2013-08-01 DIAGNOSIS — Y939 Activity, unspecified: Secondary | ICD-10-CM | POA: Insufficient documentation

## 2013-08-01 DIAGNOSIS — J449 Chronic obstructive pulmonary disease, unspecified: Secondary | ICD-10-CM | POA: Insufficient documentation

## 2013-08-01 DIAGNOSIS — F17200 Nicotine dependence, unspecified, uncomplicated: Secondary | ICD-10-CM | POA: Insufficient documentation

## 2013-08-01 HISTORY — DX: Chronic obstructive pulmonary disease, unspecified: J44.9

## 2013-08-01 MED ORDER — CYCLOBENZAPRINE HCL 10 MG PO TABS: 10.0000 mg | ORAL_TABLET | Freq: Two times a day (BID) | ORAL | Status: DC | PRN

## 2013-08-01 MED ORDER — CYCLOBENZAPRINE HCL 10 MG PO TABS
10.0000 mg | ORAL_TABLET | Freq: Once | ORAL | Status: AC
  Administered 2013-08-01: 10 mg via ORAL
  Filled 2013-08-01: qty 1

## 2013-08-01 NOTE — ED Notes (Signed)
Pt alert & oriented x4, stable gait. Patient given discharge instructions, paperwork & prescription(s). Patient  instructed to stop at the registration desk to finish any additional paperwork. Patient verbalized understanding. Pt left department w/ no further questions. 

## 2013-08-01 NOTE — ED Notes (Signed)
Patient has chronic back pain, states she is having spasms in her back

## 2013-08-01 NOTE — ED Notes (Signed)
Patient complaining of lower back spasms radiating down bilateral legs x 1 week. States she was seen by a doctor on Feb 17th and was given robaxin but states "they don't do nothing. They don't help the pain at all."

## 2013-08-01 NOTE — ED Provider Notes (Signed)
CSN: [id number]     Arrival date & time 08/01/13  1643 History   This chart was scribed for Hurman Horn, MD by Ladona Ridgel Day, ED scribe. This patient was seen in room APFT22/APFT22 and the patient's care was started at .  Chief Complaint  Patient presents with  . Back Pain     (Consider location/radiation/quality/duration/timing/severity/associated sxs/prior Treatment) The history is provided by the patient. No language interpreter was used.   HPI Comments: Cynthia Deleon is a 58 y.o. female who presents to the Emergency Department complaining of back pain, ongoing for 8 years but worsened over the past x2 weeks b/c she is having back spasms.   She reports that she takes 15 mg roxicodone for chronic back/neck pain as well as regularly has injections every x3 months for same. She reports spasms of her back are new and that her pain medicine is not relieving her pain. She reports was also recently rx w/robaxin for this problem and she has had no relief.  She reports that she stopped taking her muscle relaxer medicine last week b/c they were not working; she took only 2 doses of robaxin. She reports associated nausea. She states baseline bowel incontinence and no new sx of her bowels or bladder. She denies emesis episodes  She also c/o left thumb pain after she fell on it last PM b/c she slipped on ice.   Past Medical History  Diagnosis Date  . Bipolar 1 disorder Degenerative disk disease  . Arthritis     hands and knees  . Panic attacks   . Hypertension   . Chronic back pain   . Incontinence   . Chronic neck pain   . Multiple personality disorder   . Depression   . Sleep apnea     uses a cpap-with oxygen  . Complication of anesthesia     high anxiety-does not want to be alone  . COPD (chronic obstructive pulmonary disease)    Past Surgical History  Procedure Laterality Date  . Abdominal hysterectomy    . Tonsillectomy    . Foot arthrodesis  2000    both feet  .  Bunionectomy with hammertoe reconstruction Right 12/10/2012    Procedure: RIGHT FIRST METATARSAL CHEVRON BUNION CORRECTION,  2 AND 3 HAMMERTOE CORRECTION , RIGHT 3 AND 4 TOE NAIL EXCISION ;  Surgeon: Toni Arthurs, MD;  Location: [facility];  Service: Orthopedics;  Laterality: Right;  . Rectal surgery     History reviewed. No pertinent family history. History  Substance Use Topics  . Smoking status: Current Every Day Smoker -- 0.50 packs/day    Types: Cigarettes    Last Attempt to Quit: 10/19/2012  . Smokeless tobacco: Never Used  . Alcohol Use: Yes     Comment: rarely   OB History   Grav Para Term Preterm Abortions TAB SAB Ect Mult Living                 Review of Systems  Constitutional: Negative for fever and chills.  HENT: Negative.   Respiratory: Negative for cough and shortness of breath.   Cardiovascular: Negative for chest pain.  Gastrointestinal: Positive for nausea. Negative for vomiting and abdominal pain.  Musculoskeletal: Positive for back pain. Negative for neck pain.       Left thumb pain  Skin: Negative for rash and wound.  Neurological: Negative for dizziness and headaches.  All other systems reviewed and are negative.   Allergies  Codeine; Divalproex sodium;  Paxil; and Sulfur  Home Medications   Current Outpatient Rx  Name  Route  Sig  Dispense  Refill  . albuterol (PROVENTIL HFA;VENTOLIN HFA) 108 (90 BASE) MCG/ACT inhaler   Inhalation   Inhale 1 puff into the lungs every 6 (six) hours as needed. For shortness of breath.         . clonazePAM (KLONOPIN) 0.5 MG tablet   Oral   Take 1 tablet (0.5 mg total) by mouth at bedtime. For anxiety/sleep   7 tablet   0   . hydroxypropyl methylcellulose (ISOPTO TEARS) 2.5 % ophthalmic solution   Both Eyes   Place 1 drop into both eyes daily as needed for dry eyes.         [name] latanoprost (XALATAN) 0.005 % ophthalmic solution   Both Eyes   Place 1 drop into both eyes daily.         .  methocarbamol (ROBAXIN) 500 MG tablet   Oral   Take 500 mg by mouth every 8 (eight) hours as needed for muscle spasms.         . Multiple Vitamin (MULTIVITAMIN WITH MINERALS) TABS tablet   Oral   Take 1 tablet by mouth daily.         [name] omeprazole (PRILOSEC) 20 MG capsule   Oral   Take 1 capsule (20 mg total) by mouth 2 (two) times daily. For acid reflux         . Oxcarbazepine (TRILEPTAL) 300 MG tablet   Oral   Take 300 mg by mouth daily.         [name] oxyCODONE (ROXICODONE) 15 MG immediate release tablet   Oral   Take 15 mg by mouth every 8 (eight) hours as needed for pain.         . pravastatin (PRAVACHOL) 40 MG tablet   Oral   Take 40 mg by mouth daily.         . solifenacin (VESICARE) 10 MG tablet   Oral   Take 10 mg by mouth daily.         [name] venlafaxine XR (EFFEXOR-XR) 150 MG 24 hr capsule   Oral   Take 150 mg by mouth daily with breakfast.          Triage Vitals: BP 159/89  Pulse 98  Temp(Src) 98.1 F (36.7 C) (Oral)  Resp 20  Ht 5' 8.5" (1.74 m)  Wt 175 lb (79.379 kg)  BMI 26.22 kg/m2  SpO2 94%  Physical Exam  Nursing note and vitals reviewed. Constitutional: She is oriented to person, place, and time. She appears well-developed and well-nourished. No distress.  HENT:  Head: Normocephalic and atraumatic.  Right Ear: Tympanic membrane normal.  Left Ear: Tympanic membrane normal.  Nose: Nose normal.  Mouth/Throat: Uvula is midline, oropharynx is clear and moist and mucous membranes are normal.  Eyes: EOM are normal.  Neck: Normal range of motion. Neck supple.  Cardiovascular: Normal rate, regular rhythm and intact distal pulses.   Pulmonary/Chest: Effort normal. She has no wheezes. She has no rales.  Abdominal: Soft. Bowel sounds are normal. There is no tenderness.  Musculoskeletal: Normal range of motion. She exhibits tenderness.       Lumbar back: She exhibits tenderness, pain and spasm. She exhibits normal pulse.  Normal chin to chest  w/out pain  Tenderness to palpation of her lower lumbar area radiating into the buttocks.   Neurological: She is alert and oriented to person, place, and time. She has  normal strength and normal reflexes. She displays normal reflexes. No cranial nerve deficit or sensory deficit. Gait normal.  Reflex Scores:      Bicep reflexes are 2+ on the right side and 2+ on the left side.      Brachioradialis reflexes are 2+ on the right side and 2+ on the left side.      Patellar reflexes are 2+ on the right side and 2+ on the left side.      Achilles reflexes are 2+ on the right side and 2+ on the left side. Grip strength is normal and equal, radial and pedal pulses present and strong, adequate circulation.   Skin: Skin is warm and dry.  Psychiatric: She has a normal mood and affect. Her behavior is normal.   ED Course  Procedures (including critical care time) DIAGNOSTIC STUDIES:    COORDINATION OF CARE: At 556 PM Discussed treatment plan with patient which includes left finger X-ray. Patient agrees.   Dg Finger Thumb Left  08/01/2013   CLINICAL DATA:  Left thumb pain secondary to a fall yesterday.  EXAM: LEFT THUMB 2+V  COMPARISON:  Radiographs dated 10/24/2009  FINDINGS: There is no evidence of fracture or dislocation. There is no evidence of arthropathy or other focal bone abnormality. Soft tissues are unremarkable  IMPRESSION: Normal exam.   Electronically Signed   By: Rozetta Nunnery M.D.   On: 08/01/2013 18:32    MDM 58 y.o. female with chronic back pain and states no relief with her "Roxie's 15 mg" and other medications she has been taking. Patient also with left thumb pain.  Discussed with the patient clinical findings and plan of care and all questioned fully answered. I personally performed the services described in this documentation, which was scribed in my presence. The recorded information has been reviewed and is accurate. Discussed with the patient that she will need to follow up with  Dr. Cindie Laroche for adding or adjusting pain medication.   I personally performed the services described in this documentation, which was scribed in my presence. The recorded information has been reviewed and is accurate.     [place], [place] 08/01/13 [phone]

## 2013-08-01 NOTE — Discharge Instructions (Signed)
Follow up with Dr. Cindie Laroche tomorrow to discuss your pain management.

## 2013-08-02 NOTE — ED Provider Notes (Signed)
Medical screening examination/treatment/procedure(s) were performed by non-physician practitioner and as supervising physician I was immediately available for consultation/collaboration.  Babette Relic, MD 08/02/13 1344

## 2013-08-27 ENCOUNTER — Emergency Department (HOSPITAL_COMMUNITY)
Admission: EM | Admit: 2013-08-27 | Discharge: 2013-08-27 | Disposition: A | Discharge status: Home / Self Care | Payer: No Typology Code available for payment source | Attending: Emergency Medicine | Admitting: Emergency Medicine

## 2013-08-27 ENCOUNTER — Encounter (HOSPITAL_COMMUNITY): Payer: Self-pay | Admitting: Emergency Medicine

## 2013-08-27 ENCOUNTER — Emergency Department (HOSPITAL_COMMUNITY): Payer: No Typology Code available for payment source

## 2013-08-27 DIAGNOSIS — Y9389 Activity, other specified: Secondary | ICD-10-CM | POA: Insufficient documentation

## 2013-08-27 DIAGNOSIS — J449 Chronic obstructive pulmonary disease, unspecified: Secondary | ICD-10-CM | POA: Insufficient documentation

## 2013-08-27 DIAGNOSIS — F319 Bipolar disorder, unspecified: Secondary | ICD-10-CM | POA: Diagnosis not present

## 2013-08-27 DIAGNOSIS — S161XXA Strain of muscle, fascia and tendon at neck level, initial encounter: Secondary | ICD-10-CM

## 2013-08-27 DIAGNOSIS — F3289 Other specified depressive episodes: Secondary | ICD-10-CM | POA: Diagnosis not present

## 2013-08-27 DIAGNOSIS — G4730 Sleep apnea, unspecified: Secondary | ICD-10-CM | POA: Insufficient documentation

## 2013-08-27 DIAGNOSIS — S39012A Strain of muscle, fascia and tendon of lower back, initial encounter: Secondary | ICD-10-CM

## 2013-08-27 DIAGNOSIS — G8929 Other chronic pain: Secondary | ICD-10-CM | POA: Insufficient documentation

## 2013-08-27 DIAGNOSIS — S335XXA Sprain of ligaments of lumbar spine, initial encounter: Secondary | ICD-10-CM | POA: Diagnosis not present

## 2013-08-27 DIAGNOSIS — S139XXA Sprain of joints and ligaments of unspecified parts of neck, initial encounter: Secondary | ICD-10-CM | POA: Diagnosis not present

## 2013-08-27 DIAGNOSIS — S0093XA Contusion of unspecified part of head, initial encounter: Secondary | ICD-10-CM

## 2013-08-27 DIAGNOSIS — F410 Panic disorder [episodic paroxysmal anxiety] without agoraphobia: Secondary | ICD-10-CM | POA: Diagnosis not present

## 2013-08-27 DIAGNOSIS — I10 Essential (primary) hypertension: Secondary | ICD-10-CM | POA: Diagnosis not present

## 2013-08-27 DIAGNOSIS — Y92410 Unspecified street and highway as the place of occurrence of the external cause: Secondary | ICD-10-CM | POA: Insufficient documentation

## 2013-08-27 DIAGNOSIS — F29 Unspecified psychosis not due to a substance or known physiological condition: Secondary | ICD-10-CM | POA: Insufficient documentation

## 2013-08-27 DIAGNOSIS — S0003XA Contusion of scalp, initial encounter: Secondary | ICD-10-CM | POA: Diagnosis not present

## 2013-08-27 DIAGNOSIS — S1093XA Contusion of unspecified part of neck, initial encounter: Secondary | ICD-10-CM

## 2013-08-27 DIAGNOSIS — S0083XA Contusion of other part of head, initial encounter: Secondary | ICD-10-CM

## 2013-08-27 DIAGNOSIS — Z9981 Dependence on supplemental oxygen: Secondary | ICD-10-CM | POA: Insufficient documentation

## 2013-08-27 DIAGNOSIS — F17200 Nicotine dependence, unspecified, uncomplicated: Secondary | ICD-10-CM | POA: Diagnosis not present

## 2013-08-27 DIAGNOSIS — S199XXA Unspecified injury of neck, initial encounter: Secondary | ICD-10-CM | POA: Diagnosis present

## 2013-08-27 DIAGNOSIS — R5381 Other malaise: Secondary | ICD-10-CM | POA: Diagnosis not present

## 2013-08-27 DIAGNOSIS — R5383 Other fatigue: Secondary | ICD-10-CM

## 2013-08-27 DIAGNOSIS — Z8739 Personal history of other diseases of the musculoskeletal system and connective tissue: Secondary | ICD-10-CM | POA: Insufficient documentation

## 2013-08-27 DIAGNOSIS — S0993XA Unspecified injury of face, initial encounter: Secondary | ICD-10-CM | POA: Diagnosis present

## 2013-08-27 DIAGNOSIS — F329 Major depressive disorder, single episode, unspecified: Secondary | ICD-10-CM | POA: Insufficient documentation

## 2013-08-27 DIAGNOSIS — Z79899 Other long term (current) drug therapy: Secondary | ICD-10-CM | POA: Insufficient documentation

## 2013-08-27 DIAGNOSIS — J4489 Other specified chronic obstructive pulmonary disease: Secondary | ICD-10-CM | POA: Insufficient documentation

## 2013-08-27 LAB — CBC WITH DIFFERENTIAL/PLATELET
BASOS PCT: 1 % (ref 0–1)
Basophils Absolute: 0.1 10*3/uL (ref 0.0–0.1)
EOS ABS: 0.2 10*3/uL (ref 0.0–0.7)
Eosinophils Relative: 2 % (ref 0–5)
HEMATOCRIT: 45 % (ref 36.0–46.0)
HEMOGLOBIN: 15.3 g/dL — AB (ref 12.0–15.0)
Lymphocytes Relative: 37 % (ref 12–46)
Lymphs Abs: 3.6 10*3/uL (ref 0.7–4.0)
MCH: 31.3 pg (ref 26.0–34.0)
MCHC: 34 g/dL (ref 30.0–36.0)
MCV: 92 fL (ref 78.0–100.0)
MONO ABS: 0.5 10*3/uL (ref 0.1–1.0)
MONOS PCT: 5 % (ref 3–12)
Neutro Abs: 5.4 10*3/uL (ref 1.7–7.7)
Neutrophils Relative %: 56 % (ref 43–77)
Platelets: 235 10*3/uL (ref 150–400)
RBC: 4.89 MIL/uL (ref 3.87–5.11)
RDW: 13.1 % (ref 11.5–15.5)
WBC: 9.7 10*3/uL (ref 4.0–10.5)

## 2013-08-27 LAB — COMPREHENSIVE METABOLIC PANEL
ALBUMIN: 3.8 g/dL (ref 3.5–5.2)
ALT: 11 U/L (ref 0–35)
AST: 17 U/L (ref 0–37)
Alkaline Phosphatase: 71 U/L (ref 39–117)
BILIRUBIN TOTAL: 0.4 mg/dL (ref 0.3–1.2)
BUN: 11 mg/dL (ref 6–23)
CALCIUM: 9.3 mg/dL (ref 8.4–10.5)
CO2: 28 mEq/L (ref 19–32)
CREATININE: 0.75 mg/dL (ref 0.50–1.10)
Chloride: 102 mEq/L (ref 96–112)
GFR calc Af Amer: >90 mL/min (ref >90)
GFR calc non Af Amer: >90 mL/min (ref >90)
Glucose, Bld: 144 mg/dL — ABNORMAL HIGH (ref 70–99)
Potassium: 3.7 mEq/L (ref 3.7–5.3)
Sodium: 142 mEq/L (ref 137–147)
TOTAL PROTEIN: 6.9 g/dL (ref 6.0–8.3)

## 2013-08-27 MED ORDER — KETOROLAC TROMETHAMINE 60 MG/2ML IM SOLN
60.0000 mg | Freq: Once | INTRAMUSCULAR | Status: AC
  Administered 2013-08-27: 60 mg via INTRAMUSCULAR
  Filled 2013-08-27: qty 2

## 2013-08-27 NOTE — Discharge Instructions (Signed)
Follow up with your md next week if any problems °
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
## 2013-09-26 ENCOUNTER — Emergency Department (HOSPITAL_COMMUNITY): Payer: No Typology Code available for payment source

## 2013-09-26 ENCOUNTER — Encounter (HOSPITAL_COMMUNITY): Payer: Self-pay | Admitting: Emergency Medicine

## 2013-09-26 ENCOUNTER — Emergency Department (HOSPITAL_COMMUNITY)
Admission: EM | Admit: 2013-09-26 | Discharge: 2013-09-26 | Disposition: A | Discharge status: Home / Self Care | Payer: No Typology Code available for payment source | Attending: Emergency Medicine | Admitting: Emergency Medicine

## 2013-09-26 DIAGNOSIS — M5412 Radiculopathy, cervical region: Secondary | ICD-10-CM

## 2013-09-26 DIAGNOSIS — Z9981 Dependence on supplemental oxygen: Secondary | ICD-10-CM | POA: Diagnosis not present

## 2013-09-26 DIAGNOSIS — S335XXA Sprain of ligaments of lumbar spine, initial encounter: Secondary | ICD-10-CM | POA: Insufficient documentation

## 2013-09-26 DIAGNOSIS — M19049 Primary osteoarthritis, unspecified hand: Secondary | ICD-10-CM | POA: Diagnosis not present

## 2013-09-26 DIAGNOSIS — J449 Chronic obstructive pulmonary disease, unspecified: Secondary | ICD-10-CM | POA: Diagnosis not present

## 2013-09-26 DIAGNOSIS — F17200 Nicotine dependence, unspecified, uncomplicated: Secondary | ICD-10-CM | POA: Diagnosis not present

## 2013-09-26 DIAGNOSIS — R52 Pain, unspecified: Secondary | ICD-10-CM | POA: Diagnosis not present

## 2013-09-26 DIAGNOSIS — Z79899 Other long term (current) drug therapy: Secondary | ICD-10-CM | POA: Diagnosis not present

## 2013-09-26 DIAGNOSIS — I10 Essential (primary) hypertension: Secondary | ICD-10-CM | POA: Diagnosis not present

## 2013-09-26 DIAGNOSIS — Z792 Long term (current) use of antibiotics: Secondary | ICD-10-CM | POA: Insufficient documentation

## 2013-09-26 DIAGNOSIS — Y92410 Unspecified street and highway as the place of occurrence of the external cause: Secondary | ICD-10-CM | POA: Insufficient documentation

## 2013-09-26 DIAGNOSIS — Y9389 Activity, other specified: Secondary | ICD-10-CM | POA: Insufficient documentation

## 2013-09-26 DIAGNOSIS — M1710 Unilateral primary osteoarthritis, unspecified knee: Secondary | ICD-10-CM | POA: Diagnosis not present

## 2013-09-26 DIAGNOSIS — G4730 Sleep apnea, unspecified: Secondary | ICD-10-CM | POA: Insufficient documentation

## 2013-09-26 DIAGNOSIS — F319 Bipolar disorder, unspecified: Secondary | ICD-10-CM | POA: Insufficient documentation

## 2013-09-26 DIAGNOSIS — G8929 Other chronic pain: Secondary | ICD-10-CM | POA: Diagnosis not present

## 2013-09-26 DIAGNOSIS — S139XXA Sprain of joints and ligaments of unspecified parts of neck, initial encounter: Secondary | ICD-10-CM | POA: Insufficient documentation

## 2013-09-26 DIAGNOSIS — S39012A Strain of muscle, fascia and tendon of lower back, initial encounter: Secondary | ICD-10-CM

## 2013-09-26 DIAGNOSIS — IMO0002 Reserved for concepts with insufficient information to code with codable children: Secondary | ICD-10-CM | POA: Diagnosis not present

## 2013-09-26 DIAGNOSIS — J4489 Other specified chronic obstructive pulmonary disease: Secondary | ICD-10-CM | POA: Insufficient documentation

## 2013-09-26 DIAGNOSIS — S161XXA Strain of muscle, fascia and tendon at neck level, initial encounter: Secondary | ICD-10-CM

## 2013-09-26 DIAGNOSIS — M5416 Radiculopathy, lumbar region: Secondary | ICD-10-CM

## 2013-09-26 DIAGNOSIS — F410 Panic disorder [episodic paroxysmal anxiety] without agoraphobia: Secondary | ICD-10-CM | POA: Diagnosis not present

## 2013-09-26 MED ORDER — CYCLOBENZAPRINE HCL 5 MG PO TABS: 5.0000 mg | ORAL_TABLET | Freq: Three times a day (TID) | ORAL | Status: DC | PRN

## 2013-09-26 MED ORDER — KETOROLAC TROMETHAMINE 60 MG/2ML IM SOLN
60.0000 mg | Freq: Once | INTRAMUSCULAR | Status: AC
  Administered 2013-09-26: 60 mg via INTRAMUSCULAR
  Filled 2013-09-26: qty 2

## 2013-09-26 MED ORDER — CYCLOBENZAPRINE HCL 10 MG PO TABS
10.0000 mg | ORAL_TABLET | Freq: Once | ORAL | Status: AC
  Administered 2013-09-26: 10 mg via ORAL
  Filled 2013-09-26: qty 1

## 2013-09-26 NOTE — ED Notes (Signed)
Delay in  Getting x-ray reports.  Pt in er for extended time.
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
## 2013-09-27 NOTE — ED Provider Notes (Signed)
Medical screening examination/treatment/procedure(s) were performed by non-physician practitioner and as supervising physician I was immediately available for consultation/collaboration.   EKG Interpretation None       Richarda Blade, MD 09/27/13 1800

## 2013-11-02 ENCOUNTER — Encounter (HOSPITAL_COMMUNITY): Payer: Self-pay | Admitting: Emergency Medicine

## 2013-11-02 ENCOUNTER — Emergency Department (HOSPITAL_COMMUNITY)
Admission: EM | Admit: 2013-11-02 | Discharge: 2013-11-05 | Disposition: A | Discharge status: Home / Self Care | Payer: MEDICAID | Attending: Emergency Medicine | Admitting: Emergency Medicine

## 2013-11-02 DIAGNOSIS — R51 Headache: Secondary | ICD-10-CM | POA: Insufficient documentation

## 2013-11-02 DIAGNOSIS — M1710 Unilateral primary osteoarthritis, unspecified knee: Secondary | ICD-10-CM | POA: Insufficient documentation

## 2013-11-02 DIAGNOSIS — F410 Panic disorder [episodic paroxysmal anxiety] without agoraphobia: Secondary | ICD-10-CM | POA: Insufficient documentation

## 2013-11-02 DIAGNOSIS — Z79899 Other long term (current) drug therapy: Secondary | ICD-10-CM | POA: Insufficient documentation

## 2013-11-02 DIAGNOSIS — J449 Chronic obstructive pulmonary disease, unspecified: Secondary | ICD-10-CM | POA: Insufficient documentation

## 2013-11-02 DIAGNOSIS — F319 Bipolar disorder, unspecified: Secondary | ICD-10-CM

## 2013-11-02 DIAGNOSIS — IMO0002 Reserved for concepts with insufficient information to code with codable children: Secondary | ICD-10-CM | POA: Insufficient documentation

## 2013-11-02 DIAGNOSIS — Z9981 Dependence on supplemental oxygen: Secondary | ICD-10-CM | POA: Insufficient documentation

## 2013-11-02 DIAGNOSIS — Z3202 Encounter for pregnancy test, result negative: Secondary | ICD-10-CM | POA: Insufficient documentation

## 2013-11-02 DIAGNOSIS — M19049 Primary osteoarthritis, unspecified hand: Secondary | ICD-10-CM | POA: Insufficient documentation

## 2013-11-02 DIAGNOSIS — J4489 Other specified chronic obstructive pulmonary disease: Secondary | ICD-10-CM | POA: Insufficient documentation

## 2013-11-02 DIAGNOSIS — G8929 Other chronic pain: Secondary | ICD-10-CM | POA: Insufficient documentation

## 2013-11-02 DIAGNOSIS — I10 Essential (primary) hypertension: Secondary | ICD-10-CM | POA: Insufficient documentation

## 2013-11-02 DIAGNOSIS — Z792 Long term (current) use of antibiotics: Secondary | ICD-10-CM | POA: Insufficient documentation

## 2013-11-02 DIAGNOSIS — G4730 Sleep apnea, unspecified: Secondary | ICD-10-CM | POA: Insufficient documentation

## 2013-11-02 DIAGNOSIS — F17200 Nicotine dependence, unspecified, uncomplicated: Secondary | ICD-10-CM | POA: Insufficient documentation

## 2013-11-02 LAB — URINALYSIS, ROUTINE W REFLEX MICROSCOPIC
BILIRUBIN URINE: NEGATIVE
Glucose, UA: NEGATIVE mg/dL
HGB URINE DIPSTICK: NEGATIVE
Ketones, ur: NEGATIVE mg/dL
Leukocytes, UA: NEGATIVE
Nitrite: POSITIVE — AB
Protein, ur: NEGATIVE mg/dL
Specific Gravity, Urine: <1.005 — ABNORMAL LOW (ref 1.005–1.030)
UROBILINOGEN UA: 0.2 mg/dL (ref 0.0–1.0)
pH: 6 (ref 5.0–8.0)

## 2013-11-02 LAB — COMPREHENSIVE METABOLIC PANEL
ALT: 11 U/L (ref 0–35)
AST: 16 U/L (ref 0–37)
Albumin: 3.4 g/dL — ABNORMAL LOW (ref 3.5–5.2)
Alkaline Phosphatase: 71 U/L (ref 39–117)
BILIRUBIN TOTAL: 0.3 mg/dL (ref 0.3–1.2)
BUN: 11 mg/dL (ref 6–23)
CO2: 29 meq/L (ref 19–32)
CREATININE: 1.01 mg/dL (ref 0.50–1.10)
Calcium: 9.1 mg/dL (ref 8.4–10.5)
Chloride: 104 mEq/L (ref 96–112)
GFR calc Af Amer: 70 mL/min — ABNORMAL LOW (ref >90)
GFR calc non Af Amer: 61 mL/min — ABNORMAL LOW (ref >90)
Glucose, Bld: 110 mg/dL — ABNORMAL HIGH (ref 70–99)
Potassium: 4.2 mEq/L (ref 3.7–5.3)
Sodium: 143 mEq/L (ref 137–147)
Total Protein: 6.2 g/dL (ref 6.0–8.3)

## 2013-11-02 LAB — RAPID URINE DRUG SCREEN, HOSP PERFORMED
Amphetamines: NOT DETECTED
Barbiturates: NOT DETECTED
Benzodiazepines: POSITIVE — AB
COCAINE: NOT DETECTED
OPIATES: NOT DETECTED
Tetrahydrocannabinol: NOT DETECTED

## 2013-11-02 LAB — URINE MICROSCOPIC-ADD ON

## 2013-11-02 LAB — CBC WITH DIFFERENTIAL/PLATELET
Basophils Absolute: 0 10*3/uL (ref 0.0–0.1)
Basophils Relative: 0 % (ref 0–1)
EOS ABS: 0.2 10*3/uL (ref 0.0–0.7)
EOS PCT: 2 % (ref 0–5)
HCT: 39.4 % (ref 36.0–46.0)
Hemoglobin: 13.1 g/dL (ref 12.0–15.0)
LYMPHS PCT: 40 % (ref 12–46)
Lymphs Abs: 2.9 10*3/uL (ref 0.7–4.0)
MCH: 30.8 pg (ref 26.0–34.0)
MCHC: 33.2 g/dL (ref 30.0–36.0)
MCV: 92.5 fL (ref 78.0–100.0)
Monocytes Absolute: 0.5 10*3/uL (ref 0.1–1.0)
Monocytes Relative: 7 % (ref 3–12)
Neutro Abs: 3.6 10*3/uL (ref 1.7–7.7)
Neutrophils Relative %: 51 % (ref 43–77)
PLATELETS: 220 10*3/uL (ref 150–400)
RBC: 4.26 MIL/uL (ref 3.87–5.11)
RDW: 13.8 % (ref 11.5–15.5)
WBC: 7.2 10*3/uL (ref 4.0–10.5)

## 2013-11-02 LAB — PREGNANCY, URINE: PREG TEST UR: NEGATIVE

## 2013-11-02 LAB — ETHANOL: Alcohol, Ethyl (B): <11 mg/dL (ref 0–11)

## 2013-11-02 MED ORDER — ALBUTEROL SULFATE HFA 108 (90 BASE) MCG/ACT IN AERS: 1.0000 | INHALATION_SPRAY | Freq: Four times a day (QID) | RESPIRATORY_TRACT | Status: DC | PRN

## 2013-11-02 MED ORDER — LORAZEPAM 1 MG PO TABS
1.0000 mg | ORAL_TABLET | Freq: Once | ORAL | Status: AC
  Administered 2013-11-02: 1 mg via ORAL

## 2013-11-02 MED ORDER — CLONAZEPAM 0.5 MG PO TABS
2.0000 mg | ORAL_TABLET | Freq: Every day | ORAL | Status: DC
Start: 2013-11-02 — End: 2013-11-05
  Administered 2013-11-03 – 2013-11-04 (×3): 2 mg via ORAL
  Filled 2013-11-02 (×3): qty 4

## 2013-11-02 MED ORDER — HYDROCORTISONE 1 % EX CREA
TOPICAL_CREAM | Freq: Two times a day (BID) | CUTANEOUS | Status: DC
  Administered 2013-11-03 – 2013-11-05 (×5): 1 via TOPICAL
  Filled 2013-11-02 (×5): qty 1.5

## 2013-11-02 MED ORDER — ALPRAZOLAM 0.5 MG PO TABS
1.0000 mg | ORAL_TABLET | Freq: Once | ORAL | Status: AC
  Administered 2013-11-02: 1 mg via ORAL
  Filled 2013-11-02: qty 2

## 2013-11-02 MED ORDER — LATANOPROST 0.005 % OP SOLN
1.0000 [drp] | Freq: Every day | OPHTHALMIC | Status: DC
  Administered 2013-11-03 – 2013-11-05 (×3): 1 [drp] via OPHTHALMIC
  Filled 2013-11-02: qty 2.5

## 2013-11-02 MED ORDER — POLYVINYL ALCOHOL 1.4 % OP SOLN
OPHTHALMIC | Status: AC
  Filled 2013-11-02: qty 15

## 2013-11-02 MED ORDER — HYDROXYZINE HCL 25 MG PO TABS: 25.0000 mg | ORAL_TABLET | Freq: Four times a day (QID) | ORAL | Status: DC | PRN

## 2013-11-02 MED ORDER — CYCLOBENZAPRINE HCL 10 MG PO TABS
5.0000 mg | ORAL_TABLET | Freq: Three times a day (TID) | ORAL | Status: DC | PRN
  Administered 2013-11-02 – 2013-11-05 (×5): 5 mg via ORAL
  Filled 2013-11-02 (×5): qty 1

## 2013-11-02 MED ORDER — POLYVINYL ALCOHOL 1.4 % OP SOLN
1.0000 [drp] | Freq: Every day | OPHTHALMIC | Status: DC | PRN
  Filled 2013-11-02: qty 15

## 2013-11-02 MED ORDER — BUSPIRONE HCL 15 MG PO TABS
30.0000 mg | ORAL_TABLET | Freq: Every day | ORAL | Status: DC
  Administered 2013-11-03 – 2013-11-04 (×3): 30 mg via ORAL
  Filled 2013-11-02: qty 2
  Filled 2013-11-02: qty 6
  Filled 2013-11-02 (×2): qty 2
  Filled 2013-11-02: qty 6
  Filled 2013-11-02: qty 2
  Filled 2013-11-02: qty 6

## 2013-11-02 MED ORDER — VENLAFAXINE HCL ER 75 MG PO CP24
150.0000 mg | ORAL_CAPSULE | Freq: Every day | ORAL | Status: DC
  Administered 2013-11-04 – 2013-11-05 (×2): 150 mg via ORAL
  Filled 2013-11-02 (×7): qty 2

## 2013-11-02 MED ORDER — LORAZEPAM 1 MG PO TABS
ORAL_TABLET | ORAL | Status: AC
  Filled 2013-11-02: qty 1

## 2013-11-02 MED ORDER — HYPROMELLOSE (GONIOSCOPIC) 2.5 % OP SOLN: 1.0000 [drp] | Freq: Every day | OPHTHALMIC | Status: DC | PRN

## 2013-11-02 MED ORDER — PANTOPRAZOLE SODIUM 40 MG PO TBEC
40.0000 mg | DELAYED_RELEASE_TABLET | Freq: Every day | ORAL | Status: DC
  Administered 2013-11-03 – 2013-11-05 (×3): 40 mg via ORAL
  Filled 2013-11-02 (×3): qty 1

## 2013-11-02 MED ORDER — DARIFENACIN HYDROBROMIDE ER 7.5 MG PO TB24
7.5000 mg | ORAL_TABLET | Freq: Every day | ORAL | Status: DC
  Administered 2013-11-03 – 2013-11-05 (×3): 7.5 mg via ORAL
  Filled 2013-11-02 (×7): qty 1

## 2013-11-02 MED ORDER — LATANOPROST 0.005 % OP SOLN
OPHTHALMIC | Status: AC
  Filled 2013-11-02: qty 2.5

## 2013-11-02 MED ORDER — SIMVASTATIN 20 MG PO TABS
20.0000 mg | ORAL_TABLET | Freq: Every day | ORAL | Status: DC
  Administered 2013-11-03 – 2013-11-04 (×3): 20 mg via ORAL
  Filled 2013-11-02 (×8): qty 1

## 2013-11-02 MED ORDER — OXYCODONE HCL 5 MG PO TABS
15.0000 mg | ORAL_TABLET | Freq: Four times a day (QID) | ORAL | Status: DC | PRN
  Administered 2013-11-02 – 2013-11-05 (×9): 15 mg via ORAL
  Filled 2013-11-02 (×9): qty 3

## 2013-11-02 MED ORDER — BRIMONIDINE TARTRATE 0.33 % EX GEL
1.0000 "application " | Freq: Every morning | CUTANEOUS | Status: DC
  Filled 2013-11-02 (×2): qty 30

## 2013-11-02 NOTE — ED Notes (Signed)
Pt ambulatory to restroom, tolerated well, sitter remains at bedside,  

## 2013-11-02 NOTE — ED Provider Notes (Signed)
CSN: [id number]     Arrival date & time 11/02/13  1050 History  This chart was scribed for Carmin Muskrat, MD,  by Stacy Gardner, ED Scribe. The patient was seen in room APA17/APA17 and the patient's care was started at 1:54 PM.    First MD Initiated Contact with Patient 11/02/13 1311     No chief complaint on file.    (Consider location/radiation/quality/duration/timing/severity/associated sxs/prior Treatment) The history is provided by the patient and medical records. No language interpreter was used.   HPI Comments: Cynthia Deleon is a 58 y.o. female who presents to the Emergency Department for an evaluation. Pt's psychiatrist changed her medication and she states the medication change made her nauseous. Pt reports that she has increased rage for the past two months. In the morning she goes to her son's room an have "rage". Pt expresses that she needs help because she is "losing it".  She reports having posterior head pain that is getting worse. Pt tried [facility] and she is now a pt at [facility] but does not think she is getting any better.   Past Medical History  Diagnosis Date  . Bipolar 1 disorder Degenerative disk disease  . Arthritis     hands and knees  . Panic attacks   . Hypertension   . Chronic back pain   . Incontinence   . Chronic neck pain   . Multiple personality disorder   . Depression   . Sleep apnea     uses a cpap-with oxygen  . Complication of anesthesia     high anxiety-does not want to be alone  . COPD (chronic obstructive pulmonary disease)    Past Surgical History  Procedure Laterality Date  . Abdominal hysterectomy    . Tonsillectomy    . Foot arthrodesis  2000    both feet  . Bunionectomy with hammertoe reconstruction Right 12/10/2012    Procedure: RIGHT FIRST METATARSAL CHEVRON BUNION CORRECTION,  2 AND 3 HAMMERTOE CORRECTION , RIGHT 3 AND 4 TOE NAIL EXCISION ;  Surgeon: Wylene Simmer, MD;  Location: [facility];  Service:  Orthopedics;  Laterality: Right;  . Rectal surgery     History reviewed. No pertinent family history. History  Substance Use Topics  . Smoking status: Current Every Day Smoker -- 0.50 packs/day    Types: Cigarettes    Last Attempt to Quit: 10/19/2012  . Smokeless tobacco: Never Used  . Alcohol Use: Yes     Comment: rarely   OB History   Grav Para Term Preterm Abortions TAB SAB Ect Mult Living                 Review of Systems  Neurological: Positive for headaches.  Psychiatric/Behavioral: Positive for behavioral problems, sleep disturbance and agitation.  All other systems reviewed and are negative.     Allergies  Chantix; Codeine; Divalproex sodium; Paxil; and Sulfur  Home Medications   Prior to Admission medications   Medication Sig Start Date End Date Taking? Authorizing Provider  busPIRone (BUSPAR) 15 MG tablet Take 30 mg by mouth at bedtime.   Yes Historical Provider, MD  oxyCODONE (ROXICODONE) 15 MG immediate release tablet Take 15 mg by mouth every 8 (eight) hours as needed for pain.   Yes Historical Provider, MD  albuterol (PROVENTIL HFA;VENTOLIN HFA) 108 (90 BASE) MCG/ACT inhaler Inhale 1 puff into the lungs every 6 (six) hours as needed. For shortness of breath. 11/25/12   Encarnacion Slates, NP  Brimonidine Tartrate (MIRVASO) 0.33 % GEL Apply 1 application topically every morning. Applied to face    Historical Provider, MD  clonazePAM (KLONOPIN) 2 MG tablet Take 2 mg by mouth at bedtime.    Historical Provider, MD  cyclobenzaprine (FLEXERIL) 5 MG tablet Take 1 tablet (5 mg total) by mouth 3 (three) times daily as needed for muscle spasms. 09/26/13   Evalee Jefferson, PA-C  desonide (DESOWEN) 0.05 % cream Apply 1 application topically 2 (two) times daily. To face for 1 week then as needed    Historical Provider, MD  hydroxypropyl methylcellulose (ISOPTO TEARS) 2.5 % ophthalmic solution Place 1 drop into both eyes daily as needed for dry eyes.    Historical Provider, MD   hydrOXYzine (ATARAX/VISTARIL) 25 MG tablet Take 25 mg by mouth 2 (two) times daily as needed.    Historical Provider, MD  ketoconazole (NIZORAL) 2 % cream Apply 1 application topically daily. Applied to face    Historical Provider, MD  ketoconazole (NIZORAL) 2 % shampoo Apply 1 application topically every other day. Applied to scalp    Historical Provider, MD  latanoprost (XALATAN) 0.005 % ophthalmic solution Place 1 drop into both eyes daily.    Historical Provider, MD  metroNIDAZOLE (METROCREAM) 0.75 % cream Apply 1 application topically daily. Applied to face    Historical Provider, MD  Multiple Vitamin (MULTIVITAMIN WITH MINERALS) TABS tablet Take 1 tablet by mouth daily.    Historical Provider, MD  omeprazole (PRILOSEC) 20 MG capsule Take 1 capsule (20 mg total) by mouth 2 (two) times daily. For acid reflux 11/25/12   Encarnacion Slates, NP  pravastatin (PRAVACHOL) 40 MG tablet Take 40 mg by mouth daily.    Historical Provider, MD  solifenacin (VESICARE) 10 MG tablet Take 10 mg by mouth daily.    Historical Provider, MD  venlafaxine XR (EFFEXOR-XR) 150 MG 24 hr capsule Take 150 mg by mouth daily with breakfast.    Historical Provider, MD   BP 136/76  Pulse 90  Temp(Src) 98.2 F (36.8 C) (Oral)  Resp 16  SpO2 97% Physical Exam  Nursing note and vitals reviewed. Constitutional: She is oriented to person, place, and time. She appears well-developed and well-nourished. No distress.  HENT:  Head: Normocephalic and atraumatic.  Eyes: Conjunctivae and EOM are normal.  Cardiovascular: Normal rate and regular rhythm.   Pulmonary/Chest: Effort normal and breath sounds normal. No stridor. No respiratory distress.  Abdominal: She exhibits no distension.  Musculoskeletal: She exhibits no edema.  Neurological: She is alert and oriented to person, place, and time. No cranial nerve deficit.  Skin: Skin is warm and dry.  Psychiatric: Her mood appears anxious. Her affect is angry and labile. Her speech  is rapid and/or pressured. Thought content is not delusional. She expresses no suicidal ideation. She expresses no suicidal plans.    ED Course  Procedures (including critical care time) DIAGNOSTIC STUDIES: Oxygen Saturation is 97% on RA, normal by my interpretation.    COORDINATION OF CARE:  1:57 PM Discussed course of care with pt which includes . Pt understands and agrees.   Labs Review Labs Reviewed  URINE RAPID DRUG SCREEN (HOSP PERFORMED) - Abnormal; Notable for the following:    Benzodiazepines POSITIVE (*)    All other components within normal limits  COMPREHENSIVE METABOLIC PANEL - Abnormal; Notable for the following:    Glucose, Bld 110 (*)    Albumin 3.4 (*)    GFR calc non Af Amer 61 (*)  GFR calc Af Amer 70 (*)    All other components within normal limits  URINALYSIS, ROUTINE W REFLEX MICROSCOPIC - Abnormal; Notable for the following:    Specific Gravity, Urine <1.005 (*)    Nitrite POSITIVE (*)    All other components within normal limits  URINE MICROSCOPIC-ADD ON - Abnormal; Notable for the following:    Bacteria, UA MANY (*)    All other components within normal limits  CBC WITH DIFFERENTIAL  ETHANOL  PREGNANCY, URINE     MDM   I personally performed the services described in this documentation, which was scribed in my presence. The recorded information has been reviewed and is accurate.   I discussed the patient's case with our behavioral health team.  This patient has a history of bipolar disorder, is presenting with concern of ongoing rage issues. Given the patient's fixation on harming others, her psychiatric history, she was medically cleared, had a psychiatric evaluation, and the recommendation was for further evaluation, and management as an inpatient.  Placement is being arranged.    Carmin Muskrat, MD 11/02/13 1723

## 2013-11-02 NOTE — ED Notes (Signed)
Pt using phone to update family, pt is sitting calmly in chair

## 2013-11-02 NOTE — Progress Notes (Signed)
Writer obtained clinical information from the ER MD Dr. Vanita Panda.  TTS counselor Pincus Sanes) will assess the patient.

## 2013-11-02 NOTE — BH Assessment (Addendum)
Consulted with AC Debarah Crape and Psychiatric Extender Lyndel Safe who is declining pt at [facility] due to her medical acuity. 1.) incontinence issues-wears and requires depends 2.) Sleep Apnea-needs breathing machine and Oxygen tank. Per Heloise Purpura pt meets inpatient criteria at this time and needs to be referred to other facilities.  Pt needs to be revaluated by psych in the morning. Tele-Psych recommended in the am.  Informed EDP Vanita Panda of plan.  Shaune Pollack, MS, [place] Assessment Counselor

## 2013-11-02 NOTE — ED Notes (Signed)
States her bipolar and post pardum stress are out of hand.  States faith and family sent her here.  States she feels like hurting other people.

## 2013-11-02 NOTE — BH Assessment (Signed)
TTS attempted to assess patient and currently having issues with connecting. TTS instructed to call [facility] back in about 10 minutes.  Shaune Pollack, MS, [place] Assessment Counselor

## 2013-11-02 NOTE — ED Notes (Signed)
Dr Lockwood at bedside,  

## 2013-11-02 NOTE — BH Assessment (Signed)
TTS assessment is complete.  Shaune Pollack, MS, [place] Assessment Counselor

## 2013-11-02 NOTE — ED Notes (Signed)
Pt presents to er for further evaluation of anger management , pt has hx of bipolar and states that she has been 'raging" lately, was taken off her valium medication a year ago and the new medication she was placed on is not working. Advises that her Dr is out of the country. Feels that she may harm someone and needs help,
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
## 2013-11-02 NOTE — ED Notes (Signed)
Pt advised that Dr Vanita Panda had been notified reference pt pain medication request, pt expressed understanding, sitter remains at bedside,

## 2013-11-02 NOTE — ED Notes (Signed)
Pt becoming arguementive  with pt across the hall, pt assisted back to room, Security at bedside, comfort measures provided, pt asking for pain medication, Dr Vanita Panda notified, sitter remains at bedside,

## 2013-11-02 NOTE — ED Notes (Signed)
Telepsy in progress, security at the door, pt is very upset wanting her medication.
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
## 2013-11-03 MED ORDER — IBUPROFEN 800 MG PO TABS
800.0000 mg | ORAL_TABLET | Freq: Once | ORAL | Status: AC
  Administered 2013-11-03: 800 mg via ORAL
  Filled 2013-11-03: qty 1

## 2013-11-03 MED ORDER — HYDROCORTISONE 1 % EX CREA
TOPICAL_CREAM | CUTANEOUS | Status: AC
  Administered 2013-11-03: 1
  Filled 2013-11-03: qty 1.5

## 2013-11-03 NOTE — ED Notes (Signed)
Patient given Ginger Ale per request.

## 2013-11-03 NOTE — ED Notes (Signed)
Family brought pt Briefs. wanded by security.

## 2013-11-03 NOTE — ED Notes (Signed)
Patient coming out of room attempting to talk to other patients. Advised patient she needed to stay in her room due to privacy concerns. Patient verbalized understanding and returned to her room.

## 2013-11-03 NOTE — ED Notes (Signed)
Pt back in room after taking a shower. Sitter at bedside.

## 2013-11-03 NOTE — ED Notes (Signed)
Patient given Ibuprofen for c/o headache and chronic neck pain.

## 2013-11-04 MED ORDER — DOCUSATE SODIUM 100 MG PO CAPS
100.0000 mg | ORAL_CAPSULE | Freq: Every day | ORAL | Status: DC | PRN
  Administered 2013-11-04: 100 mg via ORAL
  Filled 2013-11-04: qty 1

## 2013-11-04 NOTE — ED Notes (Addendum)
Documented wrong time 

## 2013-11-04 NOTE — BH Assessment (Signed)
Received call from [facility] who said their MD will accept Pt. Contacted Dr. Joseph Berkshire and he said he spoke with Pt and Pt would prefer to see psychiatry in the morning for discharge to home. Dr. Betsey Holiday agrees to this plan. Pt is scheduled for tele-psychiatry consult for 11/05/13. Notified Anderson Malta that the Pt would not be transferred.  Orpah Greek Rosana Hoes, [facility] Triage Specialist [phone]

## 2013-11-04 NOTE — ED Notes (Signed)
Pt states her back is feeling much better at this time after shower. Pt returned to room w/ sitter.

## 2013-11-04 NOTE — Progress Notes (Signed)
Per, Claiborne Billings at [facility] they do have open beds and a referral has been faxed.  Per, Pam at [facility] they do have beds and a referral has been faxed.  Per, Christa at [facility] they do have beds and a referral has been faxed.

## 2013-11-04 NOTE — ED Notes (Signed)
Pt requests to contract for safety.Otila Kluver at [facility] notified. TSS reordered.

## 2013-11-04 NOTE — ED Notes (Signed)
Pt to be seen in the AM by psychiatrist w/ plans to d/c w/ a safety plan.

## 2013-11-04 NOTE — ED Notes (Signed)
Patient requesting to take a shower. Sent to shower with sitter and security.

## 2013-11-04 NOTE — ED Notes (Signed)
Patient returned from shower. Requesting "cream to put on my tick bite on my back."

## 2013-11-04 NOTE — ED Notes (Signed)
Pt allowed to shower in order to help relieve some back pain. Sitter remains w/ pt.
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
## 2013-11-04 NOTE — ED Notes (Signed)
Attempted to call [facility] for update on pt disposition plan. No answer.

## 2013-11-04 NOTE — ED Notes (Signed)
Applied hydrocortisone cream to left side of back area. Patient has "tick bite" with redness surrounding area. Patient complaining of itching to area. Patient stated "it feels better after you put that cream on there. It's not itching anymore." Covered with sterile gauze and tape.

## 2013-11-04 NOTE — ED Notes (Signed)
Pt complaining of her chronic back pain. Pt wanting heating pad, advised non available, wanting to try hot shower. Will check w/ charge nurse.

## 2013-11-05 DIAGNOSIS — F4325 Adjustment disorder with mixed disturbance of emotions and conduct: Secondary | ICD-10-CM

## 2013-11-05 DIAGNOSIS — F3160 Bipolar disorder, current episode mixed, unspecified: Secondary | ICD-10-CM

## 2013-11-05 DIAGNOSIS — F410 Panic disorder [episodic paroxysmal anxiety] without agoraphobia: Secondary | ICD-10-CM

## 2013-11-05 LAB — URINALYSIS, ROUTINE W REFLEX MICROSCOPIC
Bilirubin Urine: NEGATIVE
Glucose, UA: NEGATIVE mg/dL
Hgb urine dipstick: NEGATIVE
KETONES UR: NEGATIVE mg/dL
Leukocytes, UA: NEGATIVE
NITRITE: NEGATIVE
Protein, ur: NEGATIVE mg/dL
Specific Gravity, Urine: 1.01 (ref 1.005–1.030)
Urobilinogen, UA: 0.2 mg/dL (ref 0.0–1.0)
pH: 6 (ref 5.0–8.0)

## 2013-11-05 MED ORDER — CYCLOBENZAPRINE HCL 10 MG PO TABS
10.0000 mg | ORAL_TABLET | Freq: Once | ORAL | Status: AC
  Administered 2013-11-05: 10 mg via ORAL
  Filled 2013-11-05: qty 1

## 2013-11-05 NOTE — ED Provider Notes (Addendum)
Pt states she is feeling mentally better, is c/o back spasms, states she takes flexeril 10 mg and has 5 mg ordered. Nurse pointed out she had + nitrites on her initial UA. UA reviewed, no urine culture was done, will repeat UA this morning.  Pt is getting TSS evaluation this morning, refused admission last night, states she will return to the ED if she feels worse again.    Urinalysis    Component Value Date/Time   COLORURINE YELLOW 11/05/2013 [place] 11/05/2013 0731   LABSPEC 1.010 11/05/2013 0731   PHURINE 6.0 11/05/2013 [place] 11/05/2013 0731   HGBUR NEGATIVE 11/05/2013 [place] 11/05/2013 [place] 11/05/2013 0731   PROTEINUR NEGATIVE 11/05/2013 0731   UROBILINOGEN 0.2 11/05/2013 0731   NITRITE NEGATIVE 11/05/2013 0731   LEUKOCYTESUR NEGATIVE 11/05/2013 [place], MD, Alanson Aly, MD 11/05/13 [id number]  Janice Norrie, MD 11/05/13 [phone]

## 2013-11-05 NOTE — ED Notes (Signed)
Pt ambulated to restroom & returned to room w/ no complications. 

## 2013-11-05 NOTE — ED Notes (Signed)
Pt sitting in the doorway talking to sitter. Pt informed she needed to try & get some sleep so she would be alert for TTS in the morning. Pt states she slept most of the day yesterday but was going to return to bed & try to get some sleep.

## 2013-11-05 NOTE — ED Notes (Signed)
Plan is for re-eval with tele psych this morning 0830-0900, and pt. Wants to contract for safety and go home, no longer having thought of harming herself or others

## 2013-11-05 NOTE — ED Notes (Signed)
telepsych finished, pt. Stated she is going home
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
## 2013-11-05 NOTE — ED Notes (Signed)
Pt asked the sitter to ask this nurse if she could have more pain medication. After checking the chart went to pt room & she was resting w/ eyes closed, rise & fall of chest noted. Sitter remains in the hallway.

## 2013-11-05 NOTE — ED Notes (Signed)
Ambulated pt through hall and garden outside

## 2013-11-05 NOTE — ED Notes (Signed)
Discharge instructions reviewed with pt, questions answered. Pt verbalized understanding.  

## 2013-11-05 NOTE — ED Notes (Signed)
Awaiting effexor from pharmacy

## 2013-11-05 NOTE — Discharge Instructions (Signed)
Follow up with [facility] as discussed today with the psychiatric nurse practitioner.   Return to the ED if you feel worse or feel you may hurt yourself or someone else.

## 2013-11-29 ENCOUNTER — Other Ambulatory Visit (HOSPITAL_COMMUNITY): Payer: Self-pay | Admitting: Family Medicine

## 2013-11-29 DIAGNOSIS — Z1231 Encounter for screening mammogram for malignant neoplasm of breast: Secondary | ICD-10-CM

## 2013-12-02 ENCOUNTER — Ambulatory Visit (HOSPITAL_COMMUNITY): Payer: Self-pay

## 2013-12-09 ENCOUNTER — Ambulatory Visit (HOSPITAL_COMMUNITY): Payer: Self-pay

## 2013-12-15 ENCOUNTER — Ambulatory Visit (INDEPENDENT_AMBULATORY_CARE_PROVIDER_SITE_OTHER): Payer: Medicaid Other | Admitting: Adult Health

## 2013-12-15 ENCOUNTER — Ambulatory Visit (HOSPITAL_COMMUNITY)
Admission: RE | Admit: 2013-12-15 | Discharge: 2013-12-15 | Disposition: A | Discharge status: Home / Self Care | Payer: Medicaid Other | Source: Ambulatory Visit | Attending: Family Medicine | Admitting: Family Medicine

## 2013-12-15 ENCOUNTER — Encounter: Payer: Self-pay | Admitting: Adult Health

## 2013-12-15 VITALS — BP 140/70 | HR 78 | Ht 68.5 in | Wt 175.0 lb

## 2013-12-15 DIAGNOSIS — R232 Flushing: Secondary | ICD-10-CM

## 2013-12-15 DIAGNOSIS — Z1231 Encounter for screening mammogram for malignant neoplasm of breast: Secondary | ICD-10-CM | POA: Diagnosis present

## 2013-12-15 DIAGNOSIS — Z0000 Encounter for general adult medical examination without abnormal findings: Secondary | ICD-10-CM

## 2013-12-15 DIAGNOSIS — Z1212 Encounter for screening for malignant neoplasm of rectum: Secondary | ICD-10-CM

## 2013-12-15 DIAGNOSIS — Z01419 Encounter for gynecological examination (general) (routine) without abnormal findings: Secondary | ICD-10-CM

## 2013-12-15 HISTORY — DX: Flushing: R23.2

## 2013-12-15 LAB — HEMOCCULT GUIAC POC 1CARD (OFFICE): FECAL OCCULT BLD: NEGATIVE

## 2013-12-15 MED ORDER — ESTRADIOL 0.52 MG/0.87 GM (0.06%) TD GEL: 1.0000 "application " | Freq: Every day | TRANSDERMAL | Status: DC

## 2013-12-15 NOTE — Patient Instructions (Signed)
Menopause Menopause is the normal time of life when menstrual periods stop completely. Menopause is complete when you have missed 12 consecutive menstrual periods. It usually occurs between the ages of 48 years and 55 years. Very rarely does a woman develop menopause before the age of 40 years. At menopause, your ovaries stop producing the female hormones estrogen and progesterone. This can cause undesirable symptoms and also affect your health. Sometimes the symptoms may occur 4-5 years before the menopause begins. There is no relationship between menopause and:  Oral contraceptives.  Number of children you had.  Race.  The age your menstrual periods started (menarche). Heavy smokers and very thin women may develop menopause earlier in life. CAUSES  The ovaries stop producing the female hormones estrogen and progesterone.  Other causes include:  Surgery to remove both ovaries.  The ovaries stop functioning for no known reason.  Tumors of the pituitary gland in the brain.  Medical disease that affects the ovaries and hormone production.  Radiation treatment to the abdomen or pelvis.  Chemotherapy that affects the ovaries. SYMPTOMS   Hot flashes.  Night sweats.  Decrease in sex drive.  Vaginal dryness and thinning of the vagina causing painful intercourse.  Dryness of the skin and developing wrinkles.  Headaches.  Tiredness.  Irritability.  Memory problems.  Weight gain.  Bladder infections.  Hair growth of the face and chest.  Infertility. More serious symptoms include:  Loss of bone (osteoporosis) causing breaks (fractures).  Depression.  Hardening and narrowing of the arteries (atherosclerosis) causing heart attacks and strokes. DIAGNOSIS   When the menstrual periods have stopped for 12 straight months.  Physical exam.  Hormone studies of the blood. TREATMENT  There are many treatment choices and nearly as many questions about them. The  decisions to treat or not to treat menopausal changes is an individual choice made with your health care provider. Your health care provider can discuss the treatments with you. Together, you can decide which treatment will work best for you. Your treatment choices may include:   Hormone therapy (estrogen and progesterone).  Non-hormonal medicines.  Treating the individual symptoms with medicine (for example antidepressants for depression).  Herbal medicines that may help specific symptoms.  Counseling by a psychiatrist or psychologist.  Group therapy.  Lifestyle changes including:  Eating healthy.  Regular exercise.  Limiting caffeine and alcohol.  Stress management and meditation.  No treatment. HOME CARE INSTRUCTIONS   Take the medicine your health care provider gives you as directed.  Get plenty of sleep and rest.  Exercise regularly.  Eat a diet that contains calcium (good for the bones) and soy products (acts like estrogen hormone).  Avoid alcoholic beverages.  Do not smoke.  If you have hot flashes, dress in layers.  Take supplements, calcium, and vitamin D to strengthen bones.  You can use over-the-counter lubricants or moisturizers for vaginal dryness.  Group therapy is sometimes very helpful.  Acupuncture may be helpful in some cases. SEEK MEDICAL CARE IF:   You are not sure you are in menopause.  You are having menopausal symptoms and need advice and treatment.  You are still having menstrual periods after age 55 years.  You have pain with intercourse.  Menopause is complete (no menstrual period for 12 months) and you develop vaginal bleeding.  You need a referral to a specialist (gynecologist, psychiatrist, or psychologist) for treatment. SEEK IMMEDIATE MEDICAL CARE IF:   You have severe depression.  You have excessive vaginal bleeding.    You fell and think you have a broken bone.  You have pain when you urinate.  You develop leg or  chest pain.  You have a fast pounding heart beat (palpitations).  You have severe headaches.  You develop vision problems.  You feel a lump in your breast.  You have abdominal pain or severe indigestion. Document Released: 08/17/2003 Document Revised: 01/27/2013 Document Reviewed: 12/24/2012 [facility] Patient Information 2015 [place], [place]. This information is not intended to replace advice given to you by your health care provider. Make sure you discuss any questions you have with your health care provider. Try gel  return in 4 weeks for follow up Try LUVENA for vaginal moisture

## 2013-12-15 NOTE — Progress Notes (Signed)
[name], female   DOB: Apr 21, 1956, 58 y.o.   MRN: [id number] History of Present Illness: Cynthia Deleon is a 58 year old white female,new to this practice, in complaining of hot flashes that have affected her life.She can't go outside when it is hot or have a sex partner.She says she is moody and has vaginal dryness too.   Current Medications, Allergies, Past Medical History, Past Surgical History, Family History and Social History were reviewed in [employer] record.   Past Medical History  Diagnosis Date  . Bipolar 1 disorder Degenerative disk disease  . Arthritis     hands and knees  . Panic attacks   . Hypertension   . Chronic back pain   . Incontinence   . Chronic neck pain   . Multiple personality disorder   . Depression   . Sleep apnea     uses a cpap-with oxygen  . Complication of anesthesia     high anxiety-does not want to be alone  . COPD (chronic obstructive pulmonary disease)   . Hot flashes 12/15/2013  . Peptic ulcer   . Rosacea   . Shingles   . IBS (irritable bowel syndrome)   Current outpatient prescriptions:albuterol (PROVENTIL HFA;VENTOLIN HFA) 108 (90 BASE) MCG/ACT inhaler, Inhale 1 puff into the lungs every 6 (six) hours as needed. For shortness of breath., Disp: , Rfl: ;  Brimonidine Tartrate (MIRVASO) 0.33 % GEL, Apply 1 application topically every morning. Applied to face, Disp: , Rfl: ;  busPIRone (BUSPAR) 15 MG tablet, Take 30 mg by mouth at bedtime., Disp: , Rfl:  clonazePAM (KLONOPIN) 2 MG tablet, Take 2 mg by mouth at bedtime., Disp: , Rfl: ;  cyclobenzaprine (FLEXERIL) 10 MG tablet, Take 10 mg by mouth 2 (two) times daily., Disp: , Rfl: ;  desonide (DESOWEN) 0.05 % cream, Apply 1 application topically 2 (two) times daily as needed (applied to face). Applied to face, Disp: , Rfl: ;  diazepam (VALIUM) 10 MG tablet, Take 10 mg by mouth as needed for anxiety., Disp: , Rfl:  esomeprazole (NEXIUM) 40 MG capsule, Take 40 mg by mouth  2 (two) times daily., Disp: , Rfl: ;  hydrocortisone (ANUSOL-HC) 2.5 % rectal cream, Place 1 application rectally 2 (two) times daily., Disp: , Rfl: ;  hydroxypropyl methylcellulose (ISOPTO TEARS) 2.5 % ophthalmic solution, Place 1 drop into both eyes daily as needed for dry eyes., Disp: , Rfl:  ketoconazole (NIZORAL) 2 % cream, Apply 1 application topically 2 (two) times a week. Applied to face, Disp: , Rfl: ;  ketoconazole (NIZORAL) 2 % shampoo, Apply 1 application topically 2 (two) times a week. Applied to scalp, Disp: , Rfl: ;  latanoprost (XALATAN) 0.005 % ophthalmic solution, Place 1 drop into both eyes daily., Disp: , Rfl: ;  metroNIDAZOLE (METROCREAM) 0.75 % cream, Apply 1 application topically daily. Applied to face, Disp: , Rfl:  Multiple Vitamin (MULTIVITAMIN WITH MINERALS) TABS tablet, Take 1 tablet by mouth daily., Disp: , Rfl: ;  oxcarbazepine (TRILEPTAL) 600 MG tablet, Take 600 mg by mouth 2 (two) times daily., Disp: , Rfl: ;  oxyCODONE (ROXICODONE) 15 MG immediate release tablet, Take 15 mg by mouth 4 (four) times daily. , Disp: , Rfl: ;  pravastatin (PRAVACHOL) 40 MG tablet, Take 40 mg by mouth daily., Disp: , Rfl:  solifenacin (VESICARE) 10 MG tablet, Take 10 mg by mouth daily., Disp: , Rfl: ;  venlafaxine XR (EFFEXOR-XR) 75 MG 24 hr capsule, Take 75 mg  by mouth 3 (three) times daily., Disp: , Rfl: ;  Estradiol (ELESTRIN) 0.52 MG/0.87 GM (0.06%) GEL, Apply 1 application topically daily., Disp: 3 Bottle, Rfl: 0  Review of Systems: Patient denies any headaches, blurred vision, shortness of breath, chest pain, abdominal pain. She has some UI and bowel seepage has had 2 rectal surgeries.No joint pain, see HPI for positives.    Physical Exam:BP 140/70  Pulse 78  Ht 5' 8.5" (1.74 m)  Wt 175 lb (79.379 kg)  BMI 26.22 kg/m2 General:  Well developed, well nourished, no acute distress Skin:  Warm and dry, face red Neck:  Midline trachea, normal thyroid Lungs; Clear to auscultation  bilaterally Breast:  No dominant palpable mass, retraction, or nipple discharge Cardiovascular: Regular rate and rhythm Abdomen:  Soft, non tender, no hepatosplenomegaly Pelvic:  External genitalia is normal in appearance for age.  The vagina has loss of color, moisture and rugae.  The cervix and uterus are absent,she had a hysterectomy 20 years ago for bleeding and ovarian cysts.  No  adnexal masses or tenderness noted. Rectal: fair sphincter tone, no polyps, or hemorrhoids felt.  Hemoccult negative. Extremities:  No swelling or varicosities noted Psych:  No mood changes, alert and cooperative,seems stressed over hot flashes L. Evans CMA was chaperone during exam. Discussed options and she wants to go on estrogen, said she took before and was good. Discussed with Dr Glo Herring and he agrees that gel is her best option, pt is aware of risk and benefits and she wants to try the estrogen gel.  Impression: Yearly gyn exam no pap Hot flashes    Plan: Physical in 1 year Try Elestrin gel 1 applicator on upper arm daily 3 samples given lot [facility] exp. 5/16 (2) and (1) GEES 4/16 Follow up in 4 weeks Review handout on menopause Try Luvena for vaginal moisture

## 2014-01-03 ENCOUNTER — Telehealth: Payer: Self-pay | Admitting: Adult Health

## 2014-01-03 NOTE — Telephone Encounter (Signed)
Did not answer then busy

## 2014-01-03 NOTE — Telephone Encounter (Signed)
Has had breast tenderness and shortness of breath too.All seems to have started since trying elestrin gel.Has history of COPD and had normal mammogram 12/15/13.Stop the elestrin gel and call me back in about 4 days to see if better,may get chest xray if persists.

## 2014-01-12 ENCOUNTER — Encounter: Payer: Self-pay | Admitting: Adult Health

## 2014-01-12 ENCOUNTER — Ambulatory Visit (INDEPENDENT_AMBULATORY_CARE_PROVIDER_SITE_OTHER): Payer: Medicaid Other | Admitting: Adult Health

## 2014-01-12 VITALS — BP 150/90 | Ht 68.5 in | Wt 175.0 lb

## 2014-01-12 DIAGNOSIS — Z79899 Other long term (current) drug therapy: Secondary | ICD-10-CM

## 2014-01-12 DIAGNOSIS — Z79818 Long term (current) use of other agents affecting estrogen receptors and estrogen levels: Secondary | ICD-10-CM

## 2014-01-12 HISTORY — DX: Long term (current) use of other agents affecting estrogen receptors and estrogen levels: Z79.818

## 2014-01-12 HISTORY — DX: Other long term (current) drug therapy: Z79.899

## 2014-01-12 MED ORDER — ESTRADIOL 0.52 MG/0.87 GM (0.06%) TD GEL: 1.0000 "application " | Freq: Every day | TRANSDERMAL | Status: DC

## 2014-01-12 NOTE — Patient Instructions (Signed)

## 2014-01-12 NOTE — Progress Notes (Signed)
Subjective:     [name], female   DOB: 12/12/1955, 58 y.o.   MRN: [id number]  HPI Cynthia Deleon is a 58 year old white female back in since starting elestrin gel, feels better, less hot flashes, has some nipple tenderness and cut back on dose and it is better.  Review of Systems See HPI Reviewed past medical,surgical, social and family history. Reviewed medications and allergies.     Objective:   Physical Exam BP 150/90  Ht 5' 8.5" (1.74 m)  Wt 175 lb (79.379 kg)  BMI 26.22 kg/m2   Talk only, she is feeling better with Estrogen gel and wants to continue, but wants to take B12 to see if helps with weight loss.and wants to take ASA.She is not happy with PCP, told her to call Dr  Dakin office, number given.  Assessment:     Current use of estrogen therapy    Plan:    Review handout on hormone therapy Refilled elestrin gel x 11 Follow up prn   Ok to take B12 OTC and baby ASA 81 mg

## 2014-02-07 ENCOUNTER — Telehealth: Payer: Self-pay | Admitting: Adult Health

## 2014-02-07 MED ORDER — ESTRADIOL 0.075 MG/24HR TD PTTW: 1.0000 | MEDICATED_PATCH | TRANSDERMAL | Status: DC

## 2014-02-07 NOTE — Telephone Encounter (Signed)
Will rx vivelle dot, insurance will not cover elestrin and it is too expensive

## 2014-04-11 ENCOUNTER — Encounter: Payer: Self-pay | Admitting: Adult Health

## 2014-04-18 ENCOUNTER — Telehealth: Payer: Self-pay | Admitting: *Deleted

## 2014-04-18 NOTE — Telephone Encounter (Signed)
Is using vivelle patch and hot flashes much better but has breast tenderness, trying cutting patch in half and let me know if that is better,can reduce patch dose.

## 2014-05-29 ENCOUNTER — Encounter (HOSPITAL_COMMUNITY): Payer: Self-pay | Admitting: *Deleted

## 2014-05-29 ENCOUNTER — Emergency Department (HOSPITAL_COMMUNITY)
Admission: EM | Admit: 2014-05-29 | Discharge: 2014-05-29 | Disposition: A | Discharge status: Home / Self Care | Payer: Medicaid Other | Attending: Emergency Medicine | Admitting: Emergency Medicine

## 2014-05-29 DIAGNOSIS — F410 Panic disorder [episodic paroxysmal anxiety] without agoraphobia: Secondary | ICD-10-CM | POA: Diagnosis not present

## 2014-05-29 DIAGNOSIS — Z9981 Dependence on supplemental oxygen: Secondary | ICD-10-CM | POA: Diagnosis not present

## 2014-05-29 DIAGNOSIS — Z79899 Other long term (current) drug therapy: Secondary | ICD-10-CM | POA: Insufficient documentation

## 2014-05-29 DIAGNOSIS — R109 Unspecified abdominal pain: Secondary | ICD-10-CM | POA: Diagnosis present

## 2014-05-29 DIAGNOSIS — Z8619 Personal history of other infectious and parasitic diseases: Secondary | ICD-10-CM | POA: Diagnosis not present

## 2014-05-29 DIAGNOSIS — N3090 Cystitis, unspecified without hematuria: Secondary | ICD-10-CM | POA: Diagnosis not present

## 2014-05-29 DIAGNOSIS — Z720 Tobacco use: Secondary | ICD-10-CM | POA: Diagnosis not present

## 2014-05-29 DIAGNOSIS — Z872 Personal history of diseases of the skin and subcutaneous tissue: Secondary | ICD-10-CM | POA: Insufficient documentation

## 2014-05-29 DIAGNOSIS — G8929 Other chronic pain: Secondary | ICD-10-CM | POA: Diagnosis not present

## 2014-05-29 DIAGNOSIS — Z7951 Long term (current) use of inhaled steroids: Secondary | ICD-10-CM | POA: Insufficient documentation

## 2014-05-29 DIAGNOSIS — G4730 Sleep apnea, unspecified: Secondary | ICD-10-CM | POA: Diagnosis not present

## 2014-05-29 DIAGNOSIS — M1990 Unspecified osteoarthritis, unspecified site: Secondary | ICD-10-CM | POA: Insufficient documentation

## 2014-05-29 DIAGNOSIS — Z87448 Personal history of other diseases of urinary system: Secondary | ICD-10-CM | POA: Insufficient documentation

## 2014-05-29 DIAGNOSIS — Z8711 Personal history of peptic ulcer disease: Secondary | ICD-10-CM | POA: Insufficient documentation

## 2014-05-29 DIAGNOSIS — F319 Bipolar disorder, unspecified: Secondary | ICD-10-CM | POA: Insufficient documentation

## 2014-05-29 LAB — URINALYSIS, ROUTINE W REFLEX MICROSCOPIC
Bilirubin Urine: NEGATIVE
GLUCOSE, UA: NEGATIVE mg/dL
KETONES UR: NEGATIVE mg/dL
Nitrite: POSITIVE — AB
PH: 6 (ref 5.0–8.0)
Protein, ur: NEGATIVE mg/dL
Specific Gravity, Urine: 1.025 (ref 1.005–1.030)
Urobilinogen, UA: 0.2 mg/dL (ref 0.0–1.0)

## 2014-05-29 LAB — URINE MICROSCOPIC-ADD ON

## 2014-05-29 MED ORDER — NITROFURANTOIN MONOHYD MACRO 100 MG PO CAPS
100.0000 mg | ORAL_CAPSULE | Freq: Two times a day (BID) | ORAL | Status: DC
  Administered 2014-05-29: 100 mg via ORAL
  Filled 2014-05-29: qty 1

## 2014-05-29 MED ORDER — NITROFURANTOIN MONOHYD MACRO 100 MG PO CAPS: 100.0000 mg | ORAL_CAPSULE | Freq: Two times a day (BID) | ORAL | Status: DC

## 2014-05-29 MED ORDER — PHENAZOPYRIDINE HCL 200 MG PO TABS: 200.0000 mg | ORAL_TABLET | Freq: Three times a day (TID) | ORAL | Status: DC

## 2014-05-29 MED ORDER — PHENAZOPYRIDINE HCL 100 MG PO TABS
200.0000 mg | ORAL_TABLET | Freq: Once | ORAL | Status: AC
  Administered 2014-05-29: 200 mg via ORAL
  Filled 2014-05-29: qty 2

## 2014-05-29 NOTE — ED Notes (Signed)
MD at bedside. 

## 2014-05-29 NOTE — ED Notes (Signed)
Pt states her pain started in her lower abdomen this am around 0500. Pt states it hurts when she urinates and there is a change in her urine. NAD noted at this time.

## 2014-05-29 NOTE — ED Provider Notes (Addendum)
CSN: [id number]     Arrival date & time 05/29/14  3818 History  This chart was scribed for Cynthia Christen, MD by Stephania Fragmin, ED Scribe. This patient was seen in room APA08/AP[facility]08 and the patient's care was started at 7:38 AM.    Chief Complaint  Patient presents with  . Abdominal Pain    The history is provided by the patient. No language interpreter was used.     HPI Comments: Cynthia Deleon is a 58 y.o. female who presents to the Emergency Department complaining of suprapubic abdominal pain that she noticed about 2-3 hours ago when she woke up to urinate. She states that her urine had a strong ammonia-like odor. She complains of associated chills and dysuria. Patient states that she has had bladder infections before, but she does not think that this feels like one because she doesn't feel the same level of urgency.  Patient denies urgency and fever. Dr. Cindie Laroche is her PCP.    Past Medical History  Diagnosis Date  . Bipolar 1 disorder Degenerative disk disease  . Arthritis     hands and knees  . Panic attacks   . Chronic back pain   . Incontinence   . Chronic neck pain   . Multiple personality disorder   . Depression   . Sleep apnea     uses a cpap-with oxygen  . Complication of anesthesia     high anxiety-does not want to be alone  . COPD (chronic obstructive pulmonary disease)   . Hot flashes 12/15/2013  . Peptic ulcer   . Rosacea   . Shingles   . IBS (irritable bowel syndrome)   . Current use of estrogen therapy 01/12/2014   Past Surgical History  Procedure Laterality Date  . Abdominal hysterectomy    . Tonsillectomy    . Foot arthrodesis  2000    both feet  . Bunionectomy with hammertoe reconstruction Right 12/10/2012    Procedure: RIGHT FIRST METATARSAL CHEVRON BUNION CORRECTION,  2 AND 3 HAMMERTOE CORRECTION , RIGHT 3 AND 4 TOE NAIL EXCISION ;  Surgeon: Wylene Simmer, MD;  Location: [place];  Service: Orthopedics;  Laterality: Right;  . Rectal surgery      Family History  Problem Relation Age of Onset  . Diabetes Mother   . Other Mother     vertigo; chronic eye disease  . COPD Father   . COPD Sister   . Alcohol abuse Brother   . Diabetes Maternal Grandmother   . Diabetes Maternal Grandfather   . Diabetes Sister   . Other Sister     hearing problems  . Hyperlipidemia Sister   . Alcohol abuse Brother   . COPD Brother   . Alcohol abuse Brother   . Alcohol abuse Brother   . Other Brother     aneursym   History  Substance Use Topics  . Smoking status: Current Every Day Smoker -- 0.50 packs/day    Types: Cigarettes    Last Attempt to Quit: 10/19/2012  . Smokeless tobacco: Never Used  . Alcohol Use: No   OB History    Gravida Para Term Preterm AB TAB SAB Ectopic Multiple Living   5 5        5      Review of Systems  Constitutional: Positive for chills. Negative for fever.  Gastrointestinal: Positive for abdominal pain.  Genitourinary: Positive for dysuria. Negative for urgency.  All other systems reviewed and are negative.  Allergies  Chantix; Codeine; Divalproex sodium; Hydrocodone; Paxil; and Sulfur  Home Medications   Prior to Admission medications   Medication Sig Start Date End Date Taking? Authorizing Provider  albuterol (PROVENTIL HFA;VENTOLIN HFA) 108 (90 BASE) MCG/ACT inhaler Inhale 1 puff into the lungs every 6 (six) hours as needed. For shortness of breath. 11/25/12   Encarnacion Slates, NP  Brimonidine Tartrate (MIRVASO) 0.33 % GEL Apply 1 application topically every morning. Applied to face    Historical Provider, MD  busPIRone (BUSPAR) 15 MG tablet Take 30 mg by mouth at bedtime.    Historical Provider, MD  clonazePAM (KLONOPIN) 2 MG tablet Take 2 mg by mouth at bedtime.    Historical Provider, MD  cyclobenzaprine (FLEXERIL) 10 MG tablet Take 10 mg by mouth 2 (two) times daily.    Historical Provider, MD  desonide (DESOWEN) 0.05 % cream Apply 1 application topically 2 (two) times daily as needed (applied to  face). Applied to face    Historical Provider, MD  diazepam (VALIUM) 10 MG tablet Take 10 mg by mouth as needed for anxiety.    Historical Provider, MD  esomeprazole (NEXIUM) 40 MG capsule Take 40 mg by mouth 2 (two) times daily.    Historical Provider, MD  estradiol (VIVELLE-DOT) 0.075 MG/24HR Place 1 patch onto the skin 2 (two) times a week. 02/07/14   Estill Dooms, NP  hydrocortisone (ANUSOL-HC) 2.5 % rectal cream Place 1 application rectally 2 (two) times daily.    Historical Provider, MD  hydroxypropyl methylcellulose (ISOPTO TEARS) 2.5 % ophthalmic solution Place 1 drop into both eyes daily as needed for dry eyes.    Historical Provider, MD  ketoconazole (NIZORAL) 2 % cream Apply 1 application topically 2 (two) times a week. Applied to face    Historical Provider, MD  ketoconazole (NIZORAL) 2 % shampoo Apply 1 application topically 2 (two) times a week. Applied to scalp    Historical Provider, MD  latanoprost (XALATAN) 0.005 % ophthalmic solution Place 1 drop into both eyes daily.    Historical Provider, MD  metroNIDAZOLE (METROCREAM) 0.75 % cream Apply 1 application topically daily. Applied to face    Historical Provider, MD  Multiple Vitamin (MULTIVITAMIN WITH MINERALS) TABS tablet Take 1 tablet by mouth daily.    Historical Provider, MD  nitrofurantoin, macrocrystal-monohydrate, (MACROBID) 100 MG capsule Take 1 capsule (100 mg total) by mouth 2 (two) times daily. X 7 days 05/29/14   Cynthia Christen, MD  oxcarbazepine (TRILEPTAL) 600 MG tablet Take 600 mg by mouth 2 (two) times daily.    Historical Provider, MD  oxyCODONE (ROXICODONE) 15 MG immediate release tablet Take 15 mg by mouth 4 (four) times daily.     Historical Provider, MD  phenazopyridine (PYRIDIUM) 200 MG tablet Take 1 tablet (200 mg total) by mouth 3 (three) times daily. 05/29/14   Cynthia Christen, MD  pravastatin (PRAVACHOL) 40 MG tablet Take 40 mg by mouth daily.    Historical Provider, MD  solifenacin (VESICARE) 10 MG tablet Take  10 mg by mouth daily.    Historical Provider, MD  venlafaxine XR (EFFEXOR-XR) 75 MG 24 hr capsule Take 75 mg by mouth 3 (three) times daily.    Historical Provider, MD   BP 136/76 mmHg  Pulse 81  Temp(Src) 97.4 F (36.3 C) (Oral)  Resp 18  Ht 5\' 9"  (1.753 m)  Wt 175 lb (79.379 kg)  BMI 25.83 kg/m2  SpO2 95% Physical Exam  Constitutional: She is oriented to person, place, and  time. She appears well-developed and well-nourished.  HENT:  Head: Normocephalic and atraumatic.  Eyes: Conjunctivae and EOM are normal. Pupils are equal, round, and reactive to light.  Neck: Normal range of motion. Neck supple.  Cardiovascular: Normal rate, regular rhythm and normal heart sounds.   Pulmonary/Chest: Effort normal and breath sounds normal.  Abdominal: Soft. Bowel sounds are normal. There is tenderness.  Tender in suprapubic area.   Musculoskeletal: Normal range of motion.  Neurological: She is alert and oriented to person, place, and time.  Skin: Skin is warm and dry.  Psychiatric: She has a normal mood and affect. Her behavior is normal.  Nursing note and vitals reviewed.   ED Course  Procedures (including critical care time)  DIAGNOSTIC STUDIES: Oxygen Saturation is 94% on room air, normal by my interpretation.    COORDINATION OF CARE: 7:41 AM - Discussed treatment plan with pt at bedside which includes antibiotics and pain medication and pt agreed to plan. Patient was wary of being prescribed pain medication because she is on pain management and wants to avoid narcotic medication; I assured her that the medication I would give her is non-narcotic.   Labs Review Labs Reviewed  URINALYSIS, ROUTINE W REFLEX MICROSCOPIC - Abnormal; Notable for the following:    APPearance CLOUDY (*)    Hgb urine dipstick SMALL (*)    Nitrite POSITIVE (*)    Leukocytes, UA LARGE (*)    All other components within normal limits  URINE MICROSCOPIC-ADD ON - Abnormal; Notable for the following:     Bacteria, UA MANY (*)    All other components within normal limits    Imaging Review No results found.   EKG Interpretation None      MDM   Final diagnoses:  Cystitis   History and physical consistent with acute cystitis. Rx Macrobid and Pyridium 200 mg. No acute abdomen.   I personally performed the services described in this documentation, which was scribed in my presence. The recorded information has been reviewed and is accurate.    Cynthia Christen, MD 06/01/14 1406  Cynthia Christen, MD 06/01/14 [phone]

## 2014-05-29 NOTE — ED Notes (Signed)
Pt requested to take her at home psychiatric medications. EDP Cook approved.

## 2014-05-29 NOTE — Discharge Instructions (Signed)
You have a urinary tract infection. Increase fluids. Prescription for antibiotic and medicine for urinary pain. First dose of each medication were given in the emergency department.

## 2014-06-14 ENCOUNTER — Emergency Department (HOSPITAL_COMMUNITY)
Admission: EM | Admit: 2014-06-14 | Discharge: 2014-06-14 | Disposition: A | Discharge status: Home / Self Care | Payer: Medicaid Other | Attending: Emergency Medicine | Admitting: Emergency Medicine

## 2014-06-14 ENCOUNTER — Emergency Department (HOSPITAL_COMMUNITY): Payer: Medicaid Other

## 2014-06-14 ENCOUNTER — Encounter (HOSPITAL_COMMUNITY): Payer: Self-pay | Admitting: Emergency Medicine

## 2014-06-14 DIAGNOSIS — J449 Chronic obstructive pulmonary disease, unspecified: Secondary | ICD-10-CM | POA: Diagnosis not present

## 2014-06-14 DIAGNOSIS — K589 Irritable bowel syndrome without diarrhea: Secondary | ICD-10-CM | POA: Diagnosis not present

## 2014-06-14 DIAGNOSIS — M1990 Unspecified osteoarthritis, unspecified site: Secondary | ICD-10-CM | POA: Diagnosis not present

## 2014-06-14 DIAGNOSIS — Z8619 Personal history of other infectious and parasitic diseases: Secondary | ICD-10-CM | POA: Diagnosis not present

## 2014-06-14 DIAGNOSIS — Z720 Tobacco use: Secondary | ICD-10-CM | POA: Insufficient documentation

## 2014-06-14 DIAGNOSIS — R42 Dizziness and giddiness: Secondary | ICD-10-CM

## 2014-06-14 DIAGNOSIS — R112 Nausea with vomiting, unspecified: Secondary | ICD-10-CM | POA: Diagnosis not present

## 2014-06-14 DIAGNOSIS — Z8711 Personal history of peptic ulcer disease: Secondary | ICD-10-CM | POA: Insufficient documentation

## 2014-06-14 DIAGNOSIS — Z7952 Long term (current) use of systemic steroids: Secondary | ICD-10-CM | POA: Diagnosis not present

## 2014-06-14 DIAGNOSIS — G8929 Other chronic pain: Secondary | ICD-10-CM | POA: Diagnosis not present

## 2014-06-14 DIAGNOSIS — G4730 Sleep apnea, unspecified: Secondary | ICD-10-CM | POA: Insufficient documentation

## 2014-06-14 DIAGNOSIS — Z79899 Other long term (current) drug therapy: Secondary | ICD-10-CM | POA: Diagnosis not present

## 2014-06-14 DIAGNOSIS — Z9981 Dependence on supplemental oxygen: Secondary | ICD-10-CM | POA: Diagnosis not present

## 2014-06-14 LAB — CBC WITH DIFFERENTIAL/PLATELET
BASOS PCT: 1 % (ref 0–1)
Basophils Absolute: 0.1 10*3/uL (ref 0.0–0.1)
EOS PCT: 2 % (ref 0–5)
Eosinophils Absolute: 0.2 10*3/uL (ref 0.0–0.7)
HEMATOCRIT: 44.7 % (ref 36.0–46.0)
Hemoglobin: 15.2 g/dL — ABNORMAL HIGH (ref 12.0–15.0)
LYMPHS PCT: 30 % (ref 12–46)
Lymphs Abs: 3 10*3/uL (ref 0.7–4.0)
MCH: 31 pg (ref 26.0–34.0)
MCHC: 34 g/dL (ref 30.0–36.0)
MCV: 91.2 fL (ref 78.0–100.0)
MONO ABS: 0.6 10*3/uL (ref 0.1–1.0)
Monocytes Relative: 6 % (ref 3–12)
NEUTROS ABS: 6.1 10*3/uL (ref 1.7–7.7)
Neutrophils Relative %: 61 % (ref 43–77)
PLATELETS: 260 10*3/uL (ref 150–400)
RBC: 4.9 MIL/uL (ref 3.87–5.11)
RDW: 12.8 % (ref 11.5–15.5)
WBC: 10 10*3/uL (ref 4.0–10.5)

## 2014-06-14 LAB — BASIC METABOLIC PANEL
Anion gap: 6 (ref 5–15)
BUN: 14 mg/dL (ref 6–23)
CALCIUM: 9.2 mg/dL (ref 8.4–10.5)
CO2: 27 mmol/L (ref 19–32)
CREATININE: 0.71 mg/dL (ref 0.50–1.10)
Chloride: 106 mEq/L (ref 96–112)
GFR calc Af Amer: >90 mL/min (ref >90)
GFR calc non Af Amer: >90 mL/min (ref >90)
Glucose, Bld: 128 mg/dL — ABNORMAL HIGH (ref 70–99)
Potassium: 3.5 mmol/L (ref 3.5–5.1)
SODIUM: 139 mmol/L (ref 135–145)

## 2014-06-14 LAB — TROPONIN I: Troponin I: <0.03 ng/mL (ref <0.031)

## 2014-06-14 MED ORDER — MECLIZINE HCL 12.5 MG PO TABS
25.0000 mg | ORAL_TABLET | Freq: Once | ORAL | Status: AC
  Administered 2014-06-14: 25 mg via ORAL
  Filled 2014-06-14: qty 2

## 2014-06-14 MED ORDER — ONDANSETRON HCL 4 MG/2ML IJ SOLN
4.0000 mg | Freq: Once | INTRAMUSCULAR | Status: AC
  Administered 2014-06-14: 4 mg via INTRAVENOUS
  Filled 2014-06-14: qty 2

## 2014-06-14 MED ORDER — MECLIZINE HCL 25 MG PO TABS: 25.0000 mg | ORAL_TABLET | Freq: Three times a day (TID) | ORAL | Status: DC | PRN

## 2014-06-14 MED ORDER — DIAZEPAM 5 MG PO TABS: 5.0000 mg | ORAL_TABLET | Freq: Three times a day (TID) | ORAL | Status: DC | PRN

## 2014-06-14 MED ORDER — DIAZEPAM 5 MG/ML IJ SOLN
5.0000 mg | Freq: Once | INTRAMUSCULAR | Status: AC
  Administered 2014-06-14: 5 mg via INTRAVENOUS
  Filled 2014-06-14: qty 2

## 2014-06-14 MED ORDER — ONDANSETRON 4 MG PO TBDP: 4.0000 mg | ORAL_TABLET | Freq: Three times a day (TID) | ORAL | Status: DC | PRN

## 2014-06-14 NOTE — Discharge Instructions (Signed)
Benign Positional Vertigo Vertigo means you feel like you or your surroundings are moving when they are not. Benign positional vertigo is the most common form of vertigo. Benign means that the cause of your condition is not serious. Benign positional vertigo is more common in older adults. CAUSES  Benign positional vertigo is the result of an upset in the labyrinth system. This is an area in the middle ear that helps control your balance. This may be caused by a viral infection, head injury, or repetitive motion. However, often no specific cause is found. SYMPTOMS  Symptoms of benign positional vertigo occur when you move your head or eyes in different directions. Some of the symptoms may include:  Loss of balance and falls.  Vomiting.  Blurred vision.  Dizziness.  Nausea.  Involuntary eye movements (nystagmus). DIAGNOSIS  Benign positional vertigo is usually diagnosed by physical exam. If the specific cause of your benign positional vertigo is unknown, your caregiver may perform imaging tests, such as magnetic resonance imaging (MRI) or computed tomography (CT). TREATMENT  Your caregiver may recommend movements or procedures to correct the benign positional vertigo. Medicines such as meclizine, benzodiazepines, and medicines for nausea may be used to treat your symptoms. In rare cases, if your symptoms are caused by certain conditions that affect the inner ear, you may need surgery. HOME CARE INSTRUCTIONS   Follow your caregiver's instructions.  Move slowly. Do not make sudden body or head movements.  Avoid driving.  Avoid operating heavy machinery.  Avoid performing any tasks that would be dangerous to you or others during a vertigo episode.  Drink enough fluids to keep your urine clear or pale yellow. SEEK IMMEDIATE MEDICAL CARE IF:   You develop problems with walking, weakness, numbness, or using your arms, hands, or legs.  You have difficulty speaking.  You develop  severe headaches.  Your nausea or vomiting continues or gets worse.  You develop visual changes.  Your family or friends notice any behavioral changes.  Your condition gets worse.  You have a fever.  You develop a stiff neck or sensitivity to light. MAKE SURE YOU:   Understand these instructions.  Will watch your condition.  Will get help right away if you are not doing well or get worse. Document Released: 03/04/2006 Document Revised: 08/19/2011 Document Reviewed: 02/14/2011 Exit[employer] Patient Information 2015 [employer]. This information is not intended to replace advice given to you by your health care provider. Make sure you discuss any questions you have with your health care provider.    

## 2014-06-14 NOTE — ED Notes (Signed)
Patient complaining of "a weird feeling in the back of my head." States "it feels like a tingling." Patient lying in bed tearful at this time. Will not explain tearful affect. When asked, patient states "I don't know."

## 2014-06-14 NOTE — ED Notes (Signed)
Patient c/o dizziness with "funny feeling" in posterior aspect off head that started this morning. Per patient went to put Christmas tree up when "almost blacked out". Per patient son had to catch her. Patient vision was black for approx 15 seconds. Patient denies any slurred speech, facial drooping, or headache. Reports blurred vision and generalized weakness.

## 2014-06-14 NOTE — ED Provider Notes (Signed)
CSN: [id number]     Arrival date & time 06/14/14  1729 History  This chart was scribed for Tanna Furry, MD by Rayfield Citizen, ED Scribe. This patient was seen in room APA19/APA19 and the patient's care was started at 6:00 PM.    Chief Complaint  Patient presents with  . Dizziness   The history is provided by the patient. No language interpreter was used.     HPI Comments: Cynthia Deleon is a 59 y.o. female with past medical history of panic attacks who presents to the Emergency Department complaining of dizziness and lightheadedness. She explains that this morning, around 09:00, she was preparing to take down the christmas tree when she felt a sudden onset of dizziness and lightheadedness; she also notes one instance of her vision "blacking out" today. She reports nausea and vomiting (1x). Her symptoms worsen with movement, sitting up.   Patient also reports a "weird" feeling to the back of her head; she cannot specify this sensation further. She denies in her head. She denies cold, rhinorrhea, sinus congestion, otalgia, abnormal sleep (takes medication for sleep, no medication changes).   She reports stress at home but continually reports that her stress is "normal for her." She denies personal or familial history of heart problems or stroke. She denies history of HTN.  Past Medical History  Diagnosis Date  . Bipolar 1 disorder Degenerative disk disease  . Arthritis     hands and knees  . Panic attacks   . Chronic back pain   . Incontinence   . Chronic neck pain   . Multiple personality disorder   . Depression   . Sleep apnea     uses a cpap-with oxygen  . Complication of anesthesia     high anxiety-does not want to be alone  . COPD (chronic obstructive pulmonary disease)   . Hot flashes 12/15/2013  . Peptic ulcer   . Rosacea   . Shingles   . IBS (irritable bowel syndrome)   . Current use of estrogen therapy 01/12/2014   Past Surgical History  Procedure Laterality Date  .  Abdominal hysterectomy    . Tonsillectomy    . Foot arthrodesis  2000    both feet  . Bunionectomy with hammertoe reconstruction Right 12/10/2012    Procedure: RIGHT FIRST METATARSAL CHEVRON BUNION CORRECTION,  2 AND 3 HAMMERTOE CORRECTION , RIGHT 3 AND 4 TOE NAIL EXCISION ;  Surgeon: Wylene Simmer, MD;  Location: [facility];  Service: Orthopedics;  Laterality: Right;  . Rectal surgery     Family History  Problem Relation Age of Onset  . Diabetes Mother   . Other Mother     vertigo; chronic eye disease  . COPD Father   . COPD Sister   . Alcohol abuse Brother   . Diabetes Maternal Grandmother   . Diabetes Maternal Grandfather   . Diabetes Sister   . Other Sister     hearing problems  . Hyperlipidemia Sister   . Alcohol abuse Brother   . COPD Brother   . Alcohol abuse Brother   . Alcohol abuse Brother   . Other Brother     aneursym   History  Substance Use Topics  . Smoking status: Current Every Day Smoker -- 0.50 packs/day for 35 years    Types: Cigarettes    Last Attempt to Quit: 10/19/2012  . Smokeless tobacco: Never Used  . Alcohol Use: No   OB History    Gravida Para  Term Preterm AB TAB SAB Ectopic Multiple Living   5 5        5      Review of Systems  Constitutional: Negative for fever, chills, diaphoresis, appetite change and fatigue.  HENT: Negative for mouth sores, sore throat and trouble swallowing.   Eyes: Negative for visual disturbance.  Respiratory: Negative for cough, chest tightness, shortness of breath and wheezing.   Cardiovascular: Negative for chest pain.  Gastrointestinal: Positive for nausea and vomiting. Negative for abdominal pain, diarrhea and abdominal distention.  Endocrine: Negative for polydipsia, polyphagia and polyuria.  Genitourinary: Negative for dysuria, frequency and hematuria.  Musculoskeletal: Negative for gait problem.  Skin: Negative for color change, pallor and rash.  Neurological: Positive for dizziness and  light-headedness. Negative for syncope and headaches.  Hematological: Does not bruise/bleed easily.  Psychiatric/Behavioral: Negative for behavioral problems and confusion.    Allergies  Chantix; Codeine; Divalproex sodium; Hydrocodone; Paxil; and Sulfur  Home Medications   Prior to Admission medications   Medication Sig Start Date End Date Taking? Authorizing Provider  Brimonidine Tartrate (MIRVASO) 0.33 % GEL Apply 1 application topically every morning. Applied to face   Yes Historical Provider, MD  busPIRone (BUSPAR) 15 MG tablet Take 30 mg by mouth at bedtime.   Yes Historical Provider, MD  cyclobenzaprine (FLEXERIL) 10 MG tablet Take 10 mg by mouth 2 (two) times daily.   Yes Historical Provider, MD  esomeprazole (NEXIUM) 40 MG capsule Take 40 mg by mouth daily.    Yes Historical Provider, MD  estradiol (VIVELLE-DOT) 0.075 MG/24HR Place 1 patch onto the skin 2 (two) times a week. 02/07/14  Yes Estill Dooms, NP  hydrocortisone (ANUSOL-HC) 2.5 % rectal cream Place 1 application rectally 2 (two) times daily.   Yes Historical Provider, MD  hydroxypropyl methylcellulose (ISOPTO TEARS) 2.5 % ophthalmic solution Place 1 drop into both eyes daily as needed for dry eyes.   Yes Historical Provider, MD  hydrOXYzine (VISTARIL) 50 MG capsule Take 50 mg by mouth 2 (two) times daily.   Yes Historical Provider, MD  ketoconazole (NIZORAL) 2 % cream Apply 1 application topically 2 (two) times a week. Applied to face   Yes Historical Provider, MD  ketoconazole (NIZORAL) 2 % shampoo Apply 1 application topically 2 (two) times a week. Applied to scalp   Yes Historical Provider, MD  Multiple Vitamin (MULTIVITAMIN WITH MINERALS) TABS tablet Take 1 tablet by mouth daily.   Yes Historical Provider, MD  oxcarbazepine (TRILEPTAL) 600 MG tablet Take 600 mg by mouth 2 (two) times daily.   Yes Historical Provider, MD  oxyCODONE (ROXICODONE) 15 MG immediate release tablet Take 15 mg by mouth 4 (four) times daily.     Yes Historical Provider, MD  pravastatin (PRAVACHOL) 40 MG tablet Take 40 mg by mouth daily.   Yes Historical Provider, MD  solifenacin (VESICARE) 10 MG tablet Take 10 mg by mouth daily.   Yes Historical Provider, MD  venlafaxine XR (EFFEXOR-XR) 75 MG 24 hr capsule Take 75 mg by mouth 3 (three) times daily.   Yes Historical Provider, MD  albuterol (PROVENTIL HFA;VENTOLIN HFA) 108 (90 BASE) MCG/ACT inhaler Inhale 1 puff into the lungs every 6 (six) hours as needed. For shortness of breath. 11/25/12   Encarnacion Slates, NP  desonide (DESOWEN) 0.05 % cream Apply 1 application topically 2 (two) times daily as needed (applied to face). Applied to face    Historical Provider, MD  diazepam (VALIUM) 5 MG tablet Take 1 tablet (5 mg  total) by mouth every 8 (eight) hours as needed (vertigo). 06/14/14   Tanna Furry, MD  meclizine (ANTIVERT) 25 MG tablet Take 1 tablet (25 mg total) by mouth 3 (three) times daily as needed. 06/14/14   Tanna Furry, MD  prochlorperazine (COMPAZINE) 10 MG tablet Take 1 tablet (10 mg total) by mouth every 6 (six) hours as needed for nausea or vomiting. 06/15/14   Richarda Blade, MD  prochlorperazine (COMPAZINE) 25 MG suppository Place 1 suppository (25 mg total) rectally every 12 (twelve) hours as needed for nausea or vomiting. 06/15/14   Richarda Blade, MD   BP 143/94 mmHg  Pulse 78  Temp(Src) 98 F (36.7 C) (Oral)  Resp 15  Ht 5\' 8"  (1.727 m)  Wt 175 lb (79.379 kg)  BMI 26.61 kg/m2  SpO2 100% Physical Exam  Constitutional: She is oriented to person, place, and time. She appears well-developed and well-nourished. No distress.  HENT:  Head: Normocephalic.  Eyes: Conjunctivae are normal. Pupils are equal, round, and reactive to light. No scleral icterus.  No nystagmus, normal movements  Neck: Normal range of motion. Neck supple. No thyromegaly present.  Cardiovascular: Normal rate and regular rhythm.  Exam reveals no gallop and no friction rub.   No murmur heard. Pulmonary/Chest:  Effort normal and breath sounds normal. No respiratory distress. She has no wheezes. She has no rales.  Abdominal: Soft. Bowel sounds are normal. She exhibits no distension. There is no tenderness. There is no rebound.  Musculoskeletal: Normal range of motion.  Neurological: She is alert and oriented to person, place, and time.  Cranial nerves intact but inconsistent with responses; peripheral neurological exam intact   Skin: Skin is warm and dry. No rash noted.  Psychiatric: She has a normal mood and affect. Her behavior is normal.  Nursing note and vitals reviewed.   ED Course  Procedures   DIAGNOSTIC STUDIES: Oxygen Saturation is 100% on RA, normal by my interpretation.    COORDINATION OF CARE: 6:10 PM Discussed treatment plan with pt at bedside and pt agreed to plan.   Labs Review Labs Reviewed  CBC WITH DIFFERENTIAL - Abnormal; Notable for the following:    Hemoglobin 15.2 (*)    All other components within normal limits  BASIC METABOLIC PANEL - Abnormal; Notable for the following:    Glucose, Bld 128 (*)    All other components within normal limits  TROPONIN I    Imaging Review No results found.   EKG Interpretation   Date/Time:  Tuesday June 14 2014 17:59:55 EST Ventricular Rate:  87 PR Interval:  179 QRS Duration: 86 QT Interval:  391 QTC Calculation: 470 R Axis:   55 Text Interpretation:  Sinus rhythm Abnormal R-wave progression, early  transition Borderline T abnormalities, anterior leads ED PHYSICIAN  INTERPRETATION AVAILABLE IN [facility] Confirmed by TEST, Record  ([id number]) on 06/16/2014 7:01:14 AM      MDM   Final diagnoses:  Vertigo         Tanna Furry, MD 06/19/14 2302

## 2014-06-15 ENCOUNTER — Encounter (HOSPITAL_COMMUNITY): Payer: Self-pay | Admitting: Emergency Medicine

## 2014-06-15 ENCOUNTER — Emergency Department (HOSPITAL_COMMUNITY)
Admission: EM | Admit: 2014-06-15 | Discharge: 2014-06-15 | Disposition: A | Discharge status: Home / Self Care | Payer: Medicaid Other | Attending: Emergency Medicine | Admitting: Emergency Medicine

## 2014-06-15 DIAGNOSIS — Z79899 Other long term (current) drug therapy: Secondary | ICD-10-CM | POA: Insufficient documentation

## 2014-06-15 DIAGNOSIS — M1990 Unspecified osteoarthritis, unspecified site: Secondary | ICD-10-CM | POA: Insufficient documentation

## 2014-06-15 DIAGNOSIS — F410 Panic disorder [episodic paroxysmal anxiety] without agoraphobia: Secondary | ICD-10-CM | POA: Insufficient documentation

## 2014-06-15 DIAGNOSIS — G4730 Sleep apnea, unspecified: Secondary | ICD-10-CM | POA: Insufficient documentation

## 2014-06-15 DIAGNOSIS — G4489 Other headache syndrome: Secondary | ICD-10-CM | POA: Insufficient documentation

## 2014-06-15 DIAGNOSIS — Z8711 Personal history of peptic ulcer disease: Secondary | ICD-10-CM | POA: Insufficient documentation

## 2014-06-15 DIAGNOSIS — Z9981 Dependence on supplemental oxygen: Secondary | ICD-10-CM | POA: Insufficient documentation

## 2014-06-15 DIAGNOSIS — F319 Bipolar disorder, unspecified: Secondary | ICD-10-CM | POA: Diagnosis not present

## 2014-06-15 DIAGNOSIS — K589 Irritable bowel syndrome without diarrhea: Secondary | ICD-10-CM | POA: Diagnosis not present

## 2014-06-15 DIAGNOSIS — Z7952 Long term (current) use of systemic steroids: Secondary | ICD-10-CM | POA: Diagnosis not present

## 2014-06-15 DIAGNOSIS — G8929 Other chronic pain: Secondary | ICD-10-CM | POA: Insufficient documentation

## 2014-06-15 DIAGNOSIS — Z720 Tobacco use: Secondary | ICD-10-CM | POA: Diagnosis not present

## 2014-06-15 DIAGNOSIS — Z8619 Personal history of other infectious and parasitic diseases: Secondary | ICD-10-CM | POA: Diagnosis not present

## 2014-06-15 DIAGNOSIS — R112 Nausea with vomiting, unspecified: Secondary | ICD-10-CM | POA: Diagnosis not present

## 2014-06-15 DIAGNOSIS — J449 Chronic obstructive pulmonary disease, unspecified: Secondary | ICD-10-CM | POA: Insufficient documentation

## 2014-06-15 DIAGNOSIS — R110 Nausea: Secondary | ICD-10-CM | POA: Diagnosis present

## 2014-06-15 LAB — I-STAT CHEM 8, ED
BUN: 12 mg/dL (ref 6–23)
CREATININE: 0.7 mg/dL (ref 0.50–1.10)
Calcium, Ion: 1.14 mmol/L (ref 1.12–1.23)
Chloride: 102 mEq/L (ref 96–112)
GLUCOSE: 93 mg/dL (ref 70–99)
HCT: 54 % — ABNORMAL HIGH (ref 36.0–46.0)
Hemoglobin: 18.4 g/dL — ABNORMAL HIGH (ref 12.0–15.0)
POTASSIUM: 4.4 mmol/L (ref 3.5–5.1)
Sodium: 138 mmol/L (ref 135–145)
TCO2: 25 mmol/L (ref 0–100)

## 2014-06-15 MED ORDER — FENTANYL CITRATE 0.05 MG/ML IJ SOLN
100.0000 ug | INTRAMUSCULAR | Status: DC | PRN
  Administered 2014-06-15 (×3): 100 ug via INTRAVENOUS
  Filled 2014-06-15 (×3): qty 2

## 2014-06-15 MED ORDER — ONDANSETRON HCL 4 MG/2ML IJ SOLN
4.0000 mg | Freq: Once | INTRAMUSCULAR | Status: AC
  Administered 2014-06-15: 4 mg via INTRAVENOUS
  Filled 2014-06-15: qty 2

## 2014-06-15 MED ORDER — SODIUM CHLORIDE 0.9 % IV BOLUS (SEPSIS)
1000.0000 mL | Freq: Once | INTRAVENOUS | Status: AC
  Administered 2014-06-15: 1000 mL via INTRAVENOUS

## 2014-06-15 MED ORDER — PROCHLORPERAZINE 25 MG RE SUPP: 25.0000 mg | Freq: Two times a day (BID) | RECTAL | Status: DC | PRN

## 2014-06-15 MED ORDER — PROCHLORPERAZINE EDISYLATE 5 MG/ML IJ SOLN
10.0000 mg | Freq: Once | INTRAMUSCULAR | Status: AC
  Administered 2014-06-15: 10 mg via INTRAVENOUS
  Filled 2014-06-15: qty 2

## 2014-06-15 MED ORDER — PROCHLORPERAZINE MALEATE 10 MG PO TABS: 10.0000 mg | ORAL_TABLET | Freq: Four times a day (QID) | ORAL | Status: DC | PRN

## 2014-06-15 NOTE — Discharge Instructions (Signed)
Use either the suppository or tablet as needed for nausea and vomiting.    Nausea and Vomiting Nausea is a sick feeling that often comes before throwing up (vomiting). Vomiting is a reflex where stomach contents come out of your mouth. Vomiting can cause severe loss of body fluids (dehydration). Children and elderly adults can become dehydrated quickly, especially if they also have diarrhea. Nausea and vomiting are symptoms of a condition or disease. It is important to find the cause of your symptoms. CAUSES   Direct irritation of the stomach lining. This irritation can result from increased acid production (gastroesophageal reflux disease), infection, food poisoning, taking certain medicines (such as nonsteroidal anti-inflammatory drugs), alcohol use, or tobacco use.  Signals from the brain.These signals could be caused by a headache, heat exposure, an inner ear disturbance, increased pressure in the brain from injury, infection, a tumor, or a concussion, pain, emotional stimulus, or metabolic problems.  An obstruction in the gastrointestinal tract (bowel obstruction).  Illnesses such as diabetes, hepatitis, gallbladder problems, appendicitis, kidney problems, cancer, sepsis, atypical symptoms of a heart attack, or eating disorders.  Medical treatments such as chemotherapy and radiation.  Receiving medicine that makes you sleep (general anesthetic) during surgery. DIAGNOSIS Your caregiver may ask for tests to be done if the problems do not improve after a few days. Tests may also be done if symptoms are severe or if the reason for the nausea and vomiting is not clear. Tests may include:  Urine tests.  Blood tests.  Stool tests.  Cultures (to look for evidence of infection).  X-rays or other imaging studies. Test results can help your caregiver make decisions about treatment or the need for additional tests. TREATMENT You need to stay well hydrated. Drink frequently but in small  amounts.You may wish to drink water, sports drinks, clear broth, or eat frozen ice pops or gelatin dessert to help stay hydrated.When you eat, eating slowly may help prevent nausea.There are also some antinausea medicines that may help prevent nausea. HOME CARE INSTRUCTIONS   Take all medicine as directed by your caregiver.  If you do not have an appetite, do not force yourself to eat. However, you must continue to drink fluids.  If you have an appetite, eat a normal diet unless your caregiver tells you differently.  Eat a variety of complex carbohydrates (rice, wheat, potatoes, bread), lean meats, yogurt, fruits, and vegetables.  Avoid high-fat foods because they are more difficult to digest.  Drink enough water and fluids to keep your urine clear or pale yellow.  If you are dehydrated, ask your caregiver for specific rehydration instructions. Signs of dehydration may include:  Severe thirst.  Dry lips and mouth.  Dizziness.  Dark urine.  Decreasing urine frequency and amount.  Confusion.  Rapid breathing or pulse. SEEK IMMEDIATE MEDICAL CARE IF:   You have blood or brown flecks (like coffee grounds) in your vomit.  You have black or bloody stools.  You have a severe headache or stiff neck.  You are confused.  You have severe abdominal pain.  You have chest pain or trouble breathing.  You do not urinate at least once every 8 hours.  You develop cold or clammy skin.  You continue to vomit for longer than 24 to 48 hours.  You have a fever. MAKE SURE YOU:   Understand these instructions.  Will watch your condition.  Will get help right away if you are not doing well or get worse. Document Released: 05/27/2005 Document Revised:  08/19/2011 Document Reviewed: 10/24/2010 [employer] Patient Information 2015 [place], [place]. This information is not intended to replace advice given to you by your health care provider. Make sure you discuss any questions you have  with your health care provider.  Headaches, Frequently Asked Questions MIGRAINE HEADACHES Q: What is migraine? What causes it? How can I treat it? A: Generally, migraine headaches begin as a dull ache. Then they develop into a constant, throbbing, and pulsating pain. You may experience pain at the temples. You may experience pain at the front or back of one or both sides of the head. The pain is usually accompanied by a combination of:  Nausea.  Vomiting.  Sensitivity to light and noise. Some people (about 15%) experience an aura (see below) before an attack. The cause of migraine is believed to be chemical reactions in the brain. Treatment for migraine may include over-the-counter or prescription medications. It may also include self-help techniques. These include relaxation training and biofeedback.  Q: What is an aura? A: About 15% of people with migraine get an "aura". This is a sign of neurological symptoms that occur before a migraine headache. You may see wavy or jagged lines, dots, or flashing lights. You might experience tunnel vision or blind spots in one or both eyes. The aura can include visual or auditory hallucinations (something imagined). It may include disruptions in smell (such as strange odors), taste or touch. Other symptoms include:  Numbness.  A "pins and needles" sensation.  Difficulty in recalling or speaking the correct word. These neurological events may last as long as 60 minutes. These symptoms will fade as the headache begins. Q: What is a trigger? A: Certain physical or environmental factors can lead to or "trigger" a migraine. These include:  Foods.  Hormonal changes.  Weather.  Stress. It is important to remember that triggers are different for everyone. To help prevent migraine attacks, you need to figure out which triggers affect you. Keep a headache diary. This is a good way to track triggers. The diary will help you talk to your healthcare  professional about your condition. Q: Does weather affect migraines? A: Bright sunshine, hot, humid conditions, and drastic changes in barometric pressure may lead to, or "trigger," a migraine attack in some people. But studies have shown that weather does not act as a trigger for everyone with migraines. Q: What is the link between migraine and hormones? A: Hormones start and regulate many of your body's functions. Hormones keep your body in balance within a constantly changing environment. The levels of hormones in your body are unbalanced at times. Examples are during menstruation, pregnancy, or menopause. That can lead to a migraine attack. In fact, about three quarters of all women with migraine report that their attacks are related to the menstrual cycle.  Q: Is there an increased risk of stroke for migraine sufferers? A: The likelihood of a migraine attack causing a stroke is very remote. That is not to say that migraine sufferers cannot have a stroke associated with their migraines. In persons under age 29, the most common associated factor for stroke is migraine headache. But over the course of a person's normal life span, the occurrence of migraine headache may actually be associated with a reduced risk of dying from cerebrovascular disease due to stroke.  Q: What are acute medications for migraine? A: Acute medications are used to treat the pain of the headache after it has started. Examples over-the-counter medications, NSAIDs, ergots, and triptans.  Q:  What are the triptans? A: Triptans are the newest class of abortive medications. They are specifically targeted to treat migraine. Triptans are vasoconstrictors. They moderate some chemical reactions in the brain. The triptans work on receptors in your brain. Triptans help to restore the balance of a neurotransmitter called serotonin. Fluctuations in levels of serotonin are thought to be a main cause of migraine.  Q: Are over-the-counter  medications for migraine effective? A: Over-the-counter, or "OTC," medications may be effective in relieving mild to moderate pain and associated symptoms of migraine. But you should see your caregiver before beginning any treatment regimen for migraine.  Q: What are preventive medications for migraine? A: Preventive medications for migraine are sometimes referred to as "prophylactic" treatments. They are used to reduce the frequency, severity, and length of migraine attacks. Examples of preventive medications include antiepileptic medications, antidepressants, beta-blockers, calcium channel blockers, and NSAIDs (nonsteroidal anti-inflammatory drugs). Q: Why are anticonvulsants used to treat migraine? A: During the past few years, there has been an increased interest in antiepileptic drugs for the prevention of migraine. They are sometimes referred to as "anticonvulsants". Both epilepsy and migraine may be caused by similar reactions in the brain.  Q: Why are antidepressants used to treat migraine? A: Antidepressants are typically used to treat people with depression. They may reduce migraine frequency by regulating chemical levels, such as serotonin, in the brain.  Q: What alternative therapies are used to treat migraine? A: The term "alternative therapies" is often used to describe treatments considered outside the scope of conventional Western medicine. Examples of alternative therapy include acupuncture, acupressure, and yoga. Another common alternative treatment is herbal therapy. Some herbs are believed to relieve headache pain. Always discuss alternative therapies with your caregiver before proceeding. Some herbal products contain arsenic and other toxins. TENSION HEADACHES Q: What is a tension-type headache? What causes it? How can I treat it? A: Tension-type headaches occur randomly. They are often the result of temporary stress, anxiety, fatigue, or anger. Symptoms include soreness in your  temples, a tightening band-like sensation around your head (a "vice-like" ache). Symptoms can also include a pulling feeling, pressure sensations, and contracting head and neck muscles. The headache begins in your forehead, temples, or the back of your head and neck. Treatment for tension-type headache may include over-the-counter or prescription medications. Treatment may also include self-help techniques such as relaxation training and biofeedback. CLUSTER HEADACHES Q: What is a cluster headache? What causes it? How can I treat it? A: Cluster headache gets its name because the attacks come in groups. The pain arrives with little, if any, warning. It is usually on one side of the head. A tearing or bloodshot eye and a runny nose on the same side of the headache may also accompany the pain. Cluster headaches are believed to be caused by chemical reactions in the brain. They have been described as the most severe and intense of any headache type. Treatment for cluster headache includes prescription medication and oxygen. SINUS HEADACHES Q: What is a sinus headache? What causes it? How can I treat it? A: When a cavity in the bones of the face and skull (a sinus) becomes inflamed, the inflammation will cause localized pain. This condition is usually the result of an allergic reaction, a tumor, or an infection. If your headache is caused by a sinus blockage, such as an infection, you will probably have a fever. An x-ray will confirm a sinus blockage. Your caregiver's treatment might include antibiotics for the infection, as  well as antihistamines or decongestants.  REBOUND HEADACHES Q: What is a rebound headache? What causes it? How can I treat it? A: A pattern of taking acute headache medications too often can lead to a condition known as "rebound headache." A pattern of taking too much headache medication includes taking it more than 2 days per week or in excessive amounts. That means more than the label or a  caregiver advises. With rebound headaches, your medications not only stop relieving pain, they actually begin to cause headaches. Doctors treat rebound headache by tapering the medication that is being overused. Sometimes your caregiver will gradually substitute a different type of treatment or medication. Stopping may be a challenge. Regularly overusing a medication increases the potential for serious side effects. Consult a caregiver if you regularly use headache medications more than 2 days per week or more than the label advises. ADDITIONAL QUESTIONS AND ANSWERS Q: What is biofeedback? A: Biofeedback is a self-help treatment. Biofeedback uses special equipment to monitor your body's involuntary physical responses. Biofeedback monitors:  Breathing.  Pulse.  Heart rate.  Temperature.  Muscle tension.  Brain activity. Biofeedback helps you refine and perfect your relaxation exercises. You learn to control the physical responses that are related to stress. Once the technique has been mastered, you do not need the equipment any more. Q: Are headaches hereditary? A: Four out of five (80%) of people that suffer report a family history of migraine. Scientists are not sure if this is genetic or a family predisposition. Despite the uncertainty, a child has a 50% chance of having migraine if one parent suffers. The child has a 75% chance if both parents suffer.  Q: Can children get headaches? A: By the time they reach high school, most young people have experienced some type of headache. Many safe and effective approaches or medications can prevent a headache from occurring or stop it after it has begun.  Q: What type of doctor should I see to diagnose and treat my headache? A: Start with your primary caregiver. Discuss his or her experience and approach to headaches. Discuss methods of classification, diagnosis, and treatment. Your caregiver may decide to recommend you to a headache specialist,  depending upon your symptoms or other physical conditions. Having diabetes, allergies, etc., may require a more comprehensive and inclusive approach to your headache. The National Head[employer] Foundation will provide, upon request, a list of [facility] physician members in your state. Document Released: 08/17/2003 Document Revised: 08/19/2011 Document Reviewed: 01/25/2008 [facility] Patient Information 2015 [place], [place]. This information is not intended to replace advice given to you by your health care provider. Make sure you discuss any questions you have with your health care provider.

## 2014-06-15 NOTE — ED Notes (Signed)
Having nausea and pain to back of the head.  Rates pain 10 to back opf head.  Was seen here in ED.

## 2014-06-15 NOTE — ED Provider Notes (Signed)
CSN: [id number]     Arrival date & time 06/15/14  1447 History  This chart was scribed for Richarda Blade, MD by Stephania Fragmin, ED Scribe. This patient was seen in room APA12/APA12 and the patient's care was started at 3:36 PM.   Chief Complaint  Patient presents with  . Nausea  . Headache   The history is provided by the patient. No language interpreter was used.     HPI Comments: Cynthia Deleon is a 59 y.o. female who presents to the Emergency Department complaining of a constant, worsening, severe pain to the back of her head that has not improved since coming to the ED yesterday for the same problem, and she was diagnosed with vertigo. Patient states she cannot speak above the level of a whisper due to her pain. She states this is a new problem. Dr. Nelva Bush has her on pain management for chronic pain in her lower back and neck. She denies diarrhea, fever, cough, and chest pain.  She has not been able to eat or drink since yesterday.  He complains of vomiting and "heaving".  The headache was gradual in onset.  There are no other known modifying factors.  Past Medical History  Diagnosis Date  . Bipolar 1 disorder Degenerative disk disease  . Arthritis     hands and knees  . Panic attacks   . Chronic back pain   . Incontinence   . Chronic neck pain   . Multiple personality disorder   . Depression   . Sleep apnea     uses a cpap-with oxygen  . Complication of anesthesia     high anxiety-does not want to be alone  . COPD (chronic obstructive pulmonary disease)   . Hot flashes 12/15/2013  . Peptic ulcer   . Rosacea   . Shingles   . IBS (irritable bowel syndrome)   . Current use of estrogen therapy 01/12/2014   Past Surgical History  Procedure Laterality Date  . Abdominal hysterectomy    . Tonsillectomy    . Foot arthrodesis  2000    both feet  . Bunionectomy with hammertoe reconstruction Right 12/10/2012    Procedure: RIGHT FIRST METATARSAL CHEVRON BUNION CORRECTION,  2 AND 3  HAMMERTOE CORRECTION , RIGHT 3 AND 4 TOE NAIL EXCISION ;  Surgeon: Wylene Simmer, MD;  Location: [facility];  Service: Orthopedics;  Laterality: Right;  . Rectal surgery     Family History  Problem Relation Age of Onset  . Diabetes Mother   . Other Mother     vertigo; chronic eye disease  . COPD Father   . COPD Sister   . Alcohol abuse Brother   . Diabetes Maternal Grandmother   . Diabetes Maternal Grandfather   . Diabetes Sister   . Other Sister     hearing problems  . Hyperlipidemia Sister   . Alcohol abuse Brother   . COPD Brother   . Alcohol abuse Brother   . Alcohol abuse Brother   . Other Brother     aneursym   History  Substance Use Topics  . Smoking status: Current Every Day Smoker -- 0.50 packs/day for 35 years    Types: Cigarettes    Last Attempt to Quit: 10/19/2012  . Smokeless tobacco: Never Used  . Alcohol Use: No   OB History    Gravida Para Term Preterm AB TAB SAB Ectopic Multiple Living   5 5        5  Review of Systems  Constitutional: Negative for fever.  Respiratory: Negative for cough.   Cardiovascular: Negative for chest pain.  Gastrointestinal: Negative for diarrhea.  Neurological: Positive for headaches.  All other systems reviewed and are negative.     Allergies  Chantix; Codeine; Divalproex sodium; Hydrocodone; Paxil; and Sulfur  Home Medications   Prior to Admission medications   Medication Sig Start Date End Date Taking? Authorizing Provider  albuterol (PROVENTIL HFA;VENTOLIN HFA) 108 (90 BASE) MCG/ACT inhaler Inhale 1 puff into the lungs every 6 (six) hours as needed. For shortness of breath. 11/25/12  Yes Encarnacion Slates, NP  Brimonidine Tartrate (MIRVASO) 0.33 % GEL Apply 1 application topically every morning. Applied to face   Yes Historical Provider, MD  busPIRone (BUSPAR) 15 MG tablet Take 30 mg by mouth at bedtime.   Yes Historical Provider, MD  cyclobenzaprine (FLEXERIL) 10 MG tablet Take 10 mg by mouth 2 (two)  times daily.   Yes Historical Provider, MD  desonide (DESOWEN) 0.05 % cream Apply 1 application topically 2 (two) times daily as needed (applied to face). Applied to face   Yes Historical Provider, MD  diazepam (VALIUM) 5 MG tablet Take 1 tablet (5 mg total) by mouth every 8 (eight) hours as needed (vertigo). 06/14/14  Yes Tanna Furry, MD  esomeprazole (NEXIUM) 40 MG capsule Take 40 mg by mouth daily.    Yes Historical Provider, MD  estradiol (VIVELLE-DOT) 0.075 MG/24HR Place 1 patch onto the skin 2 (two) times a week. 02/07/14  Yes Estill Dooms, NP  hydrocortisone (ANUSOL-HC) 2.5 % rectal cream Place 1 application rectally 2 (two) times daily.   Yes Historical Provider, MD  hydroxypropyl methylcellulose (ISOPTO TEARS) 2.5 % ophthalmic solution Place 1 drop into both eyes daily as needed for dry eyes.   Yes Historical Provider, MD  hydrOXYzine (VISTARIL) 50 MG capsule Take 50 mg by mouth 2 (two) times daily.   Yes Historical Provider, MD  ketoconazole (NIZORAL) 2 % cream Apply 1 application topically 2 (two) times a week. Applied to face   Yes Historical Provider, MD  ketoconazole (NIZORAL) 2 % shampoo Apply 1 application topically 2 (two) times a week. Applied to scalp   Yes Historical Provider, MD  meclizine (ANTIVERT) 25 MG tablet Take 1 tablet (25 mg total) by mouth 3 (three) times daily as needed. 06/14/14  Yes Tanna Furry, MD  Multiple Vitamin (MULTIVITAMIN WITH MINERALS) TABS tablet Take 1 tablet by mouth daily.   Yes Historical Provider, MD  oxcarbazepine (TRILEPTAL) 600 MG tablet Take 600 mg by mouth 2 (two) times daily.   Yes Historical Provider, MD  oxyCODONE (ROXICODONE) 15 MG immediate release tablet Take 15 mg by mouth 4 (four) times daily.    Yes Historical Provider, MD  pravastatin (PRAVACHOL) 40 MG tablet Take 40 mg by mouth daily.   Yes Historical Provider, MD  solifenacin (VESICARE) 10 MG tablet Take 10 mg by mouth daily.   Yes Historical Provider, MD  venlafaxine XR (EFFEXOR-XR) 75  MG 24 hr capsule Take 75 mg by mouth 3 (three) times daily.   Yes Historical Provider, MD  prochlorperazine (COMPAZINE) 10 MG tablet Take 1 tablet (10 mg total) by mouth every 6 (six) hours as needed for nausea or vomiting. 06/15/14   Richarda Blade, MD  prochlorperazine (COMPAZINE) 25 MG suppository Place 1 suppository (25 mg total) rectally every 12 (twelve) hours as needed for nausea or vomiting. 06/15/14   Richarda Blade, MD   BP 131/72 mmHg  Pulse 83  Temp(Src) 98.7 F (37.1 C) (Oral)  Resp 18  Ht 5\' 9"  (1.753 m)  Wt 175 lb (79.379 kg)  BMI 25.83 kg/m2  SpO2 96% Physical Exam  Constitutional: She is oriented to person, place, and time. She appears well-developed and well-nourished.  HENT:  Head: Normocephalic and atraumatic.  Eyes: Conjunctivae and EOM are normal. Pupils are equal, round, and reactive to light.  Neck: Normal range of motion and phonation normal. Neck supple.  Cardiovascular: Normal rate and regular rhythm.   Pulmonary/Chest: Effort normal and breath sounds normal. She exhibits no tenderness.  Abdominal: Soft. She exhibits no distension. There is no tenderness. There is no guarding.  Musculoskeletal: Normal range of motion.  Neurological: She is alert and oriented to person, place, and time. She exhibits normal muscle tone.  Skin: Skin is warm and dry.  Psychiatric: Her behavior is normal. Judgment and thought content normal.  Tearful and crying.  Nursing note and vitals reviewed.   ED Course  Procedures (including critical care time)  DIAGNOSTIC STUDIES: Oxygen Saturation is 96% on room air, normal by my interpretation.    COORDINATION OF CARE: 3:43 PM - Discussed treatment plan with pt at bedside which includes IV fluids, pain and anti-nausea medication, and pt agreed to plan.   Medications  ondansetron (ZOFRAN) injection 4 mg (4 mg Intravenous Given 06/15/14 1552)  prochlorperazine (COMPAZINE) injection 10 mg (10 mg Intravenous Given 06/15/14 1755)  sodium  chloride 0.9 % bolus 1,000 mL (1,000 mLs Intravenous New Bag/Given 06/15/14 1800)    Patient Vitals for the past 24 hrs:  BP Temp Temp src Pulse Resp SpO2 Height Weight  06/15/14 1800 131/72 mmHg - - 83 - 96 % - -  06/15/14 1745 116/67 mmHg - - 67 - 92 % - -  06/15/14 1730 118/60 mmHg - - 68 - 91 % - -  06/15/14 1715 116/67 mmHg - - 68 - 90 % - -  06/15/14 1700 122/72 mmHg - - 63 - (!) 89 % - -  06/15/14 1645 129/71 mmHg - - 64 - 91 % - -  06/15/14 1630 106/86 mmHg - - 69 - 90 % - -  06/15/14 1615 156/72 mmHg - - 92 - 93 % - -  06/15/14 1600 140/74 mmHg - - 78 - (!) 87 % - -  06/15/14 1457 144/82 mmHg 98.7 F (37.1 C) Oral 77 18 96 % - -  06/15/14 1453 - - - - - - 5\' 9"  (1.753 m) 175 lb (79.379 kg)      5:47 PM Reevaluation with update and discussion. After initial assessment and treatment, an updated evaluation reveals after second dose of fentanyl, her headache has improved.  She continues to have some nausea.  Additional medications ordered. Dannon Nguyenthi L   7:43 PM Reevaluation with update and discussion. After initial assessment and treatment, an updated evaluation reveals nausea is better and she has only minimal headache at this time.  Findings discussed with patient, all questions answered.. [facility]  I-STAT CHEM 8, ED - Abnormal; Notable for the following:    Hemoglobin 18.4 (*)    HCT 54.0 (*)    All other components within normal limits    Imaging Review Ct Head Wo Contrast  06/14/2014   CLINICAL DATA:  Dizziness and lightheadedness  EXAM: CT HEAD WITHOUT CONTRAST  TECHNIQUE: Contiguous axial images were obtained from the base of the skull through the vertex without intravenous  contrast.  COMPARISON:  08/27/2013  FINDINGS: The bony calvarium is intact. There are changes consistent with prior nasal bone fracture. The paranasal sinuses and mastoid air cells are well aerated. No findings to suggest acute hemorrhage, acute infarction or  space-occupying mass lesion are noted.  IMPRESSION: No acute abnormality noted.   Electronically Signed   By: Inez Catalina M.D.   On: 06/14/2014 18:55     EKG Interpretation None      MDM   Final diagnoses:  Other headache syndrome  Non-intractable vomiting with nausea, vomiting of unspecified type    Nonspecific cephalalgia.  With normal neurologic exam.  Headache.  Symptoms are nonspecific.  I doubt subarachnoid hemorrhage, meningitis, cervical myelopathy/radiculopathy, or CVA.  Nausea and vomiting, are nonspecific.  She may have an element of vertigo.   Nursing Notes Reviewed/ Care Coordinated Applicable Imaging Reviewed Interpretation of Laboratory Data incorporated into ED treatment  The patient appears reasonably screened and/or stabilized for discharge and I doubt any other medical condition or other [facility] requiring further screening, evaluation, or treatment in the ED at this time prior to discharge.  Plan: Home Medications- Compazine; Home Treatments- rest; return here if the recommended treatment, does not improve the symptoms; Recommended follow up- PCP prn    I personally performed the services described in this documentation, which was scribed in my presence. The recorded information has been reviewed and is accurate.       Richarda Blade, MD 06/16/14 1425

## 2014-10-07 ENCOUNTER — Emergency Department (HOSPITAL_COMMUNITY)
Admission: EM | Admit: 2014-10-07 | Discharge: 2014-10-07 | Disposition: A | Discharge status: Home / Self Care | Payer: MEDICAID | Attending: Emergency Medicine | Admitting: Emergency Medicine

## 2014-10-07 ENCOUNTER — Encounter (HOSPITAL_COMMUNITY): Payer: Self-pay | Admitting: Emergency Medicine

## 2014-10-07 DIAGNOSIS — F319 Bipolar disorder, unspecified: Secondary | ICD-10-CM | POA: Insufficient documentation

## 2014-10-07 DIAGNOSIS — F410 Panic disorder [episodic paroxysmal anxiety] without agoraphobia: Secondary | ICD-10-CM | POA: Diagnosis not present

## 2014-10-07 DIAGNOSIS — G8929 Other chronic pain: Secondary | ICD-10-CM | POA: Insufficient documentation

## 2014-10-07 DIAGNOSIS — Z9981 Dependence on supplemental oxygen: Secondary | ICD-10-CM | POA: Diagnosis not present

## 2014-10-07 DIAGNOSIS — Z79899 Other long term (current) drug therapy: Secondary | ICD-10-CM | POA: Diagnosis not present

## 2014-10-07 DIAGNOSIS — J449 Chronic obstructive pulmonary disease, unspecified: Secondary | ICD-10-CM | POA: Insufficient documentation

## 2014-10-07 DIAGNOSIS — Z8711 Personal history of peptic ulcer disease: Secondary | ICD-10-CM | POA: Diagnosis not present

## 2014-10-07 DIAGNOSIS — G4730 Sleep apnea, unspecified: Secondary | ICD-10-CM | POA: Diagnosis not present

## 2014-10-07 DIAGNOSIS — Z8619 Personal history of other infectious and parasitic diseases: Secondary | ICD-10-CM | POA: Diagnosis not present

## 2014-10-07 DIAGNOSIS — Z872 Personal history of diseases of the skin and subcutaneous tissue: Secondary | ICD-10-CM | POA: Insufficient documentation

## 2014-10-07 DIAGNOSIS — Z7952 Long term (current) use of systemic steroids: Secondary | ICD-10-CM | POA: Diagnosis not present

## 2014-10-07 DIAGNOSIS — F419 Anxiety disorder, unspecified: Secondary | ICD-10-CM | POA: Diagnosis present

## 2014-10-07 DIAGNOSIS — Z792 Long term (current) use of antibiotics: Secondary | ICD-10-CM | POA: Insufficient documentation

## 2014-10-07 DIAGNOSIS — F430 Acute stress reaction: Secondary | ICD-10-CM

## 2014-10-07 DIAGNOSIS — Z87891 Personal history of nicotine dependence: Secondary | ICD-10-CM | POA: Diagnosis not present

## 2014-10-07 NOTE — ED Notes (Addendum)
Per EMS pt hx of anxiety with anxiety attack post attempt to reach laywer's office. Pt reports several people gave her wrong directions to office; by time reached office anxiety present. Upon assessment pt tearful and reports "must call my son because he is all I have." EKG and neuro unremarkable en route with EMS.

## 2014-10-07 NOTE — ED Notes (Signed)
Pharmacy at bedside; will ambulate per order with pharmacy completion.

## 2014-10-07 NOTE — ED Notes (Signed)
Bed: WTR5 Expected date:  Expected time:  Means of arrival:  Comments: EMS-anxiety

## 2014-10-07 NOTE — ED Notes (Signed)
Pt ambulated independently to nearby restroom to void.

## 2014-10-07 NOTE — ED Notes (Signed)
Pt given graham crackers, peanut butter, cheese, and ginger ale per request.

## 2014-10-07 NOTE — ED Provider Notes (Signed)
CSN: [id number]     Arrival date & time 10/07/14  1152 History  This chart was scribed for Domenic Moras, PA-C, working with Quintella Reichert, MD by Marti Sleigh, ED Scribe. This patient was seen in room WTR5/WTR5 and the patient's care was started at 12:13 PM.    Chief Complaint  Patient presents with  . Anxiety   HPI  HPI Comments: Cynthia Deleon is a 59 y.o. female who presents to the Emergency Department complaining of emotional problems. Pt states that she was driving to an attorney office and became frustrated because she was attempting to use a new GPS device. Pt states she got lost while she was driving due to the new device and because of this she missed her appointment. Pt states by the time she got to the  office she had sxs of a panic attack. Pt felt very weak, and felt that she could not move. Pt states at that point she began crying uncontrollably. Pt states she did not get to talk to her attorney and was transported to the ER directly from the waiting room at the office. Pt's psychiatrist is Dr. Humberto Leep. Pt denies a hx of heart issues or stroke. Pt denies taking blood pressure medication. Pt states an EKG was completed by EMS, which was normal.   Pt states she feels very weak, and exhausted, and thinks she cannot walk. Pt states she has had a panic attack in the past, but the symptoms were not this severe.  Pt denies using street drugs or alcohol. Pt denies SI or HI. Pt states she was recently put on chantex, but she stopped taking it three days ago because she was feeling weird.    Past Medical History  Diagnosis Date  . Bipolar 1 disorder Degenerative disk disease  . Arthritis     hands and knees  . Panic attacks   . Chronic back pain   . Incontinence   . Chronic neck pain   . Multiple personality disorder   . Depression   . Sleep apnea     uses a cpap-with oxygen  . Complication of anesthesia     high anxiety-does not want to be alone  . COPD (chronic obstructive pulmonary  disease)   . Hot flashes 12/15/2013  . Peptic ulcer   . Rosacea   . Shingles   . IBS (irritable bowel syndrome)   . Current use of estrogen therapy 01/12/2014   Past Surgical History  Procedure Laterality Date  . Abdominal hysterectomy    . Tonsillectomy    . Foot arthrodesis  2000    both feet  . Bunionectomy with hammertoe reconstruction Right 12/10/2012    Procedure: RIGHT FIRST METATARSAL CHEVRON BUNION CORRECTION,  2 AND 3 HAMMERTOE CORRECTION , RIGHT 3 AND 4 TOE NAIL EXCISION ;  Surgeon: Wylene Simmer, MD;  Location: [facility];  Service: Orthopedics;  Laterality: Right;  . Rectal surgery     Family History  Problem Relation Age of Onset  . Diabetes Mother   . Other Mother     vertigo; chronic eye disease  . COPD Father   . COPD Sister   . Alcohol abuse Brother   . Diabetes Maternal Grandmother   . Diabetes Maternal Grandfather   . Diabetes Sister   . Other Sister     hearing problems  . Hyperlipidemia Sister   . Alcohol abuse Brother   . COPD Brother   . Alcohol abuse Brother   .  Alcohol abuse Brother   . Other Brother     aneursym   History  Substance Use Topics  . Smoking status: Former Smoker -- 0.00 packs/day for 35 years    Types: Cigarettes    Quit date: 10/19/2012  . Smokeless tobacco: Never Used  . Alcohol Use: No   OB History    Gravida Para Term Preterm AB TAB SAB Ectopic Multiple Living   5 5        5      Review of Systems  Constitutional: Negative for fever and chills.  Respiratory: Negative for shortness of breath and wheezing.   Cardiovascular: Negative for chest pain and palpitations.  Neurological: Negative for dizziness, weakness and numbness.      Allergies  Chantix; Codeine; Divalproex sodium; Hydrocodone; Paxil; and Sulfur  Home Medications   Prior to Admission medications   Medication Sig Start Date End Date Taking? Authorizing Provider  albuterol (PROVENTIL HFA;VENTOLIN HFA) 108 (90 BASE) MCG/ACT inhaler Inhale 1  puff into the lungs every 6 (six) hours as needed. For shortness of breath. 11/25/12   Encarnacion Slates, NP  Brimonidine Tartrate (MIRVASO) 0.33 % GEL Apply 1 application topically every morning. Applied to face    Historical Provider, MD  busPIRone (BUSPAR) 15 MG tablet Take 30 mg by mouth at bedtime.    Historical Provider, MD  cyclobenzaprine (FLEXERIL) 10 MG tablet Take 10 mg by mouth 2 (two) times daily.    Historical Provider, MD  desonide (DESOWEN) 0.05 % cream Apply 1 application topically 2 (two) times daily as needed (applied to face). Applied to face    Historical Provider, MD  diazepam (VALIUM) 5 MG tablet Take 1 tablet (5 mg total) by mouth every 8 (eight) hours as needed (vertigo). 06/14/14   Tanna Furry, MD  esomeprazole (NEXIUM) 40 MG capsule Take 40 mg by mouth daily.     Historical Provider, MD  estradiol (VIVELLE-DOT) 0.075 MG/24HR Place 1 patch onto the skin 2 (two) times a week. 02/07/14   Estill Dooms, NP  hydrocortisone (ANUSOL-HC) 2.5 % rectal cream Place 1 application rectally 2 (two) times daily.    Historical Provider, MD  hydroxypropyl methylcellulose (ISOPTO TEARS) 2.5 % ophthalmic solution Place 1 drop into both eyes daily as needed for dry eyes.    Historical Provider, MD  hydrOXYzine (VISTARIL) 50 MG capsule Take 50 mg by mouth 2 (two) times daily.    Historical Provider, MD  ketoconazole (NIZORAL) 2 % cream Apply 1 application topically 2 (two) times a week. Applied to face    Historical Provider, MD  ketoconazole (NIZORAL) 2 % shampoo Apply 1 application topically 2 (two) times a week. Applied to scalp    Historical Provider, MD  meclizine (ANTIVERT) 25 MG tablet Take 1 tablet (25 mg total) by mouth 3 (three) times daily as needed. 06/14/14   Tanna Furry, MD  Multiple Vitamin (MULTIVITAMIN WITH MINERALS) TABS tablet Take 1 tablet by mouth daily.    Historical Provider, MD  oxcarbazepine (TRILEPTAL) 600 MG tablet Take 600 mg by mouth 2 (two) times daily.    Historical  Provider, MD  oxyCODONE (ROXICODONE) 15 MG immediate release tablet Take 15 mg by mouth 4 (four) times daily.     Historical Provider, MD  pravastatin (PRAVACHOL) 40 MG tablet Take 40 mg by mouth daily.    Historical Provider, MD  prochlorperazine (COMPAZINE) 10 MG tablet Take 1 tablet (10 mg total) by mouth every 6 (six) hours as needed for nausea  or vomiting. 06/15/14   Daleen Bo, MD  prochlorperazine (COMPAZINE) 25 MG suppository Place 1 suppository (25 mg total) rectally every 12 (twelve) hours as needed for nausea or vomiting. 06/15/14   Daleen Bo, MD  solifenacin (VESICARE) 10 MG tablet Take 10 mg by mouth daily.    Historical Provider, MD  venlafaxine XR (EFFEXOR-XR) 75 MG 24 hr capsule Take 75 mg by mouth 3 (three) times daily.    Historical Provider, MD   BP 138/70 mmHg  Pulse 80  Temp(Src) 97.8 F (36.6 C) (Oral)  Resp 18  SpO2 96% Physical Exam  Constitutional: She is oriented to person, place, and time. She appears well-developed and well-nourished. No distress.  HENT:  Head: Normocephalic and atraumatic.  Eyes: Conjunctivae and EOM are normal. Pupils are equal, round, and reactive to light.  Neck: Neck supple.  Cardiovascular: Normal rate and normal heart sounds.   No murmur heard. Pulmonary/Chest: Effort normal and breath sounds normal. No respiratory distress. She has no wheezes.  Musculoskeletal: Normal range of motion.  5/5 grip strength, and 5/5 strength in lower extremities. No facial droop. Lower extremity reflexes intact.   Neurological: She is alert and oriented to person, place, and time. Coordination normal.  Neurologic exam:  Speech clear, pupils equal round reactive to light, extraocular movements intact  Normal peripheral visual fields Cranial nerves III through XII normal including no facial droop Follows commands, moves all extremities x4, normal strength to bilateral upper and lower extremities at all major muscle groups including grip Sensation  normal to light touch and pinprick Coordination intact, no limb ataxia, finger-nose-finger normal Rapid alternating movements normal No pronator drift Gait normal   Skin: Skin is warm and dry. She is not diaphoretic.  Nursing note and vitals reviewed.   ED Course  Procedures  DIAGNOSTIC STUDIES: Oxygen Saturation is 96% on RA, normal by my interpretation.    COORDINATION OF CARE: 12:23 PM Discussed treatment plan with pt at bedside and pt agreed to plan.  Patient exhibits symptoms suggestive of a panic attack. She was tearful. She complained of body rigidity. This is precipitated by missing appointments due to difficulty with the GPS system. She has no focal neuro deficits concerning for stroke. She does not have any chest pain and her initial EKG was unremarkable. Patient is tearful but no SI or HI hallucination. She did take a Valium prior to arrival. She is hemodynamically stable.  1:48 PM At this time patient felt much better, able to ambulate without difficulty, she is eating and drinking and conversant. I do not think patient has symptoms of a stroke no heart attack. Patient likely having a panic attack. At this time patient felt comfortable going home. She does have a ride. She can follow-up with her psychiatrist as needed. Return precautions discussed.  Labs Review Labs Reviewed - No data to display  Imaging Review No results found.   EKG Interpretation None      MDM   Final diagnoses:  Panic attack as reaction to stress    BP 138/70 mmHg  Pulse 80  Temp(Src) 97.8 F (36.6 C) (Oral)  Resp 18  SpO2 96%   I personally performed the services described in this documentation, which was scribed in my presence. The recorded information has been reviewed and is accurate.     Domenic Moras, PA-C 10/07/14 [place], MD 10/07/14 1623

## 2014-10-07 NOTE — Discharge Instructions (Signed)

## 2014-11-09 ENCOUNTER — Other Ambulatory Visit (HOSPITAL_COMMUNITY): Payer: Self-pay | Admitting: Family Medicine

## 2014-11-09 DIAGNOSIS — Z1231 Encounter for screening mammogram for malignant neoplasm of breast: Secondary | ICD-10-CM

## 2014-11-26 ENCOUNTER — Emergency Department (HOSPITAL_COMMUNITY): Payer: Medicaid Other

## 2014-11-26 ENCOUNTER — Emergency Department (HOSPITAL_COMMUNITY)
Admission: EM | Admit: 2014-11-26 | Discharge: 2014-11-26 | Disposition: A | Discharge status: Home / Self Care | Payer: Medicaid Other | Attending: Emergency Medicine | Admitting: Emergency Medicine

## 2014-11-26 ENCOUNTER — Encounter (HOSPITAL_COMMUNITY): Payer: Self-pay | Admitting: Emergency Medicine

## 2014-11-26 DIAGNOSIS — R932 Abnormal findings on diagnostic imaging of liver and biliary tract: Secondary | ICD-10-CM

## 2014-11-26 DIAGNOSIS — J449 Chronic obstructive pulmonary disease, unspecified: Secondary | ICD-10-CM | POA: Insufficient documentation

## 2014-11-26 DIAGNOSIS — Z8719 Personal history of other diseases of the digestive system: Secondary | ICD-10-CM | POA: Insufficient documentation

## 2014-11-26 DIAGNOSIS — Z87442 Personal history of urinary calculi: Secondary | ICD-10-CM | POA: Diagnosis not present

## 2014-11-26 DIAGNOSIS — Z8619 Personal history of other infectious and parasitic diseases: Secondary | ICD-10-CM | POA: Insufficient documentation

## 2014-11-26 DIAGNOSIS — Z87891 Personal history of nicotine dependence: Secondary | ICD-10-CM | POA: Insufficient documentation

## 2014-11-26 DIAGNOSIS — R531 Weakness: Secondary | ICD-10-CM

## 2014-11-26 DIAGNOSIS — M1990 Unspecified osteoarthritis, unspecified site: Secondary | ICD-10-CM | POA: Insufficient documentation

## 2014-11-26 DIAGNOSIS — Z872 Personal history of diseases of the skin and subcutaneous tissue: Secondary | ICD-10-CM | POA: Diagnosis not present

## 2014-11-26 DIAGNOSIS — M6281 Muscle weakness (generalized): Secondary | ICD-10-CM | POA: Insufficient documentation

## 2014-11-26 DIAGNOSIS — M545 Low back pain: Secondary | ICD-10-CM

## 2014-11-26 DIAGNOSIS — Z79899 Other long term (current) drug therapy: Secondary | ICD-10-CM | POA: Diagnosis not present

## 2014-11-26 DIAGNOSIS — F329 Major depressive disorder, single episode, unspecified: Secondary | ICD-10-CM | POA: Diagnosis not present

## 2014-11-26 DIAGNOSIS — G8929 Other chronic pain: Secondary | ICD-10-CM | POA: Insufficient documentation

## 2014-11-26 DIAGNOSIS — M549 Dorsalgia, unspecified: Secondary | ICD-10-CM

## 2014-11-26 HISTORY — DX: Calculus of kidney: N20.0

## 2014-11-26 LAB — CBC WITH DIFFERENTIAL/PLATELET
BASOS PCT: 1 % (ref 0–1)
Basophils Absolute: 0.1 10*3/uL (ref 0.0–0.1)
EOS PCT: 1 % (ref 0–5)
Eosinophils Absolute: 0.2 10*3/uL (ref 0.0–0.7)
HCT: 47.4 % — ABNORMAL HIGH (ref 36.0–46.0)
Hemoglobin: 15.9 g/dL — ABNORMAL HIGH (ref 12.0–15.0)
Lymphocytes Relative: 32 % (ref 12–46)
Lymphs Abs: 3.6 10*3/uL (ref 0.7–4.0)
MCH: 31.4 pg (ref 26.0–34.0)
MCHC: 33.5 g/dL (ref 30.0–36.0)
MCV: 93.5 fL (ref 78.0–100.0)
Monocytes Absolute: 0.8 10*3/uL (ref 0.1–1.0)
Monocytes Relative: 7 % (ref 3–12)
Neutro Abs: 6.6 10*3/uL (ref 1.7–7.7)
Neutrophils Relative %: 59 % (ref 43–77)
PLATELETS: 251 10*3/uL (ref 150–400)
RBC: 5.07 MIL/uL (ref 3.87–5.11)
RDW: 13.1 % (ref 11.5–15.5)
WBC: 11.2 10*3/uL — ABNORMAL HIGH (ref 4.0–10.5)

## 2014-11-26 LAB — COMPREHENSIVE METABOLIC PANEL
ALT: 10 U/L — AB (ref 14–54)
AST: 16 U/L (ref 15–41)
Albumin: 3.9 g/dL (ref 3.5–5.0)
Alkaline Phosphatase: 54 U/L (ref 38–126)
Anion gap: 7 (ref 5–15)
BUN: 14 mg/dL (ref 6–20)
CALCIUM: 8.8 mg/dL — AB (ref 8.9–10.3)
CO2: 28 mmol/L (ref 22–32)
CREATININE: 0.71 mg/dL (ref 0.44–1.00)
Chloride: 104 mmol/L (ref 101–111)
GLUCOSE: 140 mg/dL — AB (ref 65–99)
Potassium: 4.5 mmol/L (ref 3.5–5.1)
SODIUM: 139 mmol/L (ref 135–145)
TOTAL PROTEIN: 6.8 g/dL (ref 6.5–8.1)
Total Bilirubin: 0.6 mg/dL (ref 0.3–1.2)

## 2014-11-26 LAB — URINALYSIS, ROUTINE W REFLEX MICROSCOPIC
Bilirubin Urine: NEGATIVE
Glucose, UA: NEGATIVE mg/dL
Hgb urine dipstick: NEGATIVE
KETONES UR: NEGATIVE mg/dL
NITRITE: NEGATIVE
PROTEIN: NEGATIVE mg/dL
Specific Gravity, Urine: 1.01 (ref 1.005–1.030)
Urobilinogen, UA: 0.2 mg/dL (ref 0.0–1.0)
pH: 7 (ref 5.0–8.0)

## 2014-11-26 LAB — URINE MICROSCOPIC-ADD ON

## 2014-11-26 MED ORDER — GADOBENATE DIMEGLUMINE 529 MG/ML IV SOLN
15.0000 mL | Freq: Once | INTRAVENOUS | Status: AC | PRN
  Administered 2014-11-26: 15 mL via INTRAVENOUS

## 2014-11-26 MED ORDER — METHOCARBAMOL 500 MG PO TABS: 1000.0000 mg | ORAL_TABLET | Freq: Four times a day (QID) | ORAL | Status: DC | PRN

## 2014-11-26 NOTE — ED Provider Notes (Signed)
CSN: [id number]     Arrival date & time 11/26/14  0938 History   None    Chief Complaint  Patient presents with  . Back Pain     HPI Pt was seen at 0705. Per pt, c/o gradual onset and persistence of constant acute flair of her chronic low back "pain" for the past 1 week.  Denies any change in her usual chronic pain pattern.  Pain worsens with palpation of the area and body position changes. Denies incont/retention of bowel or bladder, no saddle anesthesia, no focal motor weakness, no tingling/numbness in extremities, no fevers, no injury, no abd pain. Pt also c/o gradual onset and persistence of constant "ammonia smell" to her urine, as well as urinary frequency/urgency for the past 1 week. Denies hematuria, no vaginal bleeding/discharge, no fevers, no rash.      Past Medical History  Diagnosis Date  . Bipolar 1 disorder Degenerative disk disease  . Arthritis     hands and knees  . Panic attacks   . Chronic back pain   . Incontinence   . Chronic neck pain   . Multiple personality disorder   . Depression   . Sleep apnea     uses a cpap-with oxygen  . Complication of anesthesia     high anxiety-does not want to be alone  . COPD (chronic obstructive pulmonary disease)   . Hot flashes 12/15/2013  . Peptic ulcer   . Rosacea   . Shingles   . IBS (irritable bowel syndrome)   . Current use of estrogen therapy 01/12/2014  . Kidney stone    Past Surgical History  Procedure Laterality Date  . Abdominal hysterectomy    . Tonsillectomy    . Foot arthrodesis  2000    both feet  . Bunionectomy with hammertoe reconstruction Right 12/10/2012    Procedure: RIGHT FIRST METATARSAL CHEVRON BUNION CORRECTION,  2 AND 3 HAMMERTOE CORRECTION , RIGHT 3 AND 4 TOE NAIL EXCISION ;  Surgeon: Wylene Simmer, MD;  Location: [facility];  Service: Orthopedics;  Laterality: Right;  . Rectal surgery     Family History  Problem Relation Age of Onset  . Diabetes Mother   . Other Mother      vertigo; chronic eye disease  . COPD Father   . COPD Sister   . Alcohol abuse Brother   . Diabetes Maternal Grandmother   . Diabetes Maternal Grandfather   . Diabetes Sister   . Other Sister     hearing problems  . Hyperlipidemia Sister   . Alcohol abuse Brother   . COPD Brother   . Alcohol abuse Brother   . Alcohol abuse Brother   . Other Brother     aneursym   History  Substance Use Topics  . Smoking status: Former Smoker -- 0.00 packs/day for 35 years    Types: Cigarettes    Quit date: 10/19/2012  . Smokeless tobacco: Never Used  . Alcohol Use: No   OB History    Gravida Para Term Preterm AB TAB SAB Ectopic Multiple Living   5 5        5      Review of Systems ROS: Statement: All systems negative except as marked or noted in the HPI; Constitutional: Negative for fever and chills. ; ; Eyes: Negative for eye pain, redness and discharge. ; ; ENMT: Negative for ear pain, hoarseness, nasal congestion, sinus pressure and sore throat. ; ; Cardiovascular: Negative for chest pain, palpitations, diaphoresis,  dyspnea and peripheral edema. ; ; Respiratory: Negative for cough, wheezing and stridor. ; ; Gastrointestinal: Negative for nausea, vomiting, diarrhea, abdominal pain, blood in stool, hematemesis, jaundice and rectal bleeding. . ; ; Genitourinary: +urinary frequency/urgency. Negative for flank pain and hematuria. ; ; Musculoskeletal: +LBP. Negative for neck pain. Negative for swelling and trauma.; ; Skin: Negative for pruritus, rash, abrasions, blisters, bruising and skin lesion.; ; Neuro: Negative for headache, lightheadedness and neck stiffness. Negative for weakness, altered level of consciousness , altered mental status, extremity weakness, paresthesias, involuntary movement, seizure and syncope.      Allergies  Chantix; Codeine; Divalproex sodium; Hydrocodone; Paxil; and Sulfur  Home Medications   Prior to Admission medications   Medication Sig Start Date End Date Taking?  Authorizing Provider  albuterol (PROVENTIL HFA;VENTOLIN HFA) 108 (90 BASE) MCG/ACT inhaler Inhale 1 puff into the lungs every 6 (six) hours as needed. For shortness of breath. 11/25/12  Yes Encarnacion Slates, NP  Brimonidine Tartrate (MIRVASO) 0.33 % GEL Apply 1 application topically every morning. Applied to face   Yes Historical Provider, MD  busPIRone (BUSPAR) 15 MG tablet Take 30 mg by mouth at bedtime.   Yes Historical Provider, MD  cyclobenzaprine (FLEXERIL) 10 MG tablet Take 10 mg by mouth 2 (two) times daily as needed for muscle spasms.    Yes Historical Provider, MD  desonide (DESOWEN) 0.05 % cream Apply 1 application topically 2 (two) times daily as needed (applied to face). Applied to face   Yes Historical Provider, MD  diazepam (VALIUM) 5 MG tablet Take 1 tablet (5 mg total) by mouth every 8 (eight) hours as needed (vertigo). 06/14/14  Yes Tanna Furry, MD  esomeprazole (NEXIUM) 40 MG capsule Take 40 mg by mouth 2 (two) times daily before a meal.    Yes Historical Provider, MD  estradiol (VIVELLE-DOT) 0.075 MG/24HR Place 1 patch onto the skin 2 (two) times a week. Patient taking differently: Place 0.5 patches onto the skin every 3 (three) days.  02/07/14  Yes Estill Dooms, NP  hydrocortisone (ANUSOL-HC) 2.5 % rectal cream Place 1 application rectally 2 (two) times daily as needed for itching.    Yes Historical Provider, MD  hydroxypropyl methylcellulose (ISOPTO TEARS) 2.5 % ophthalmic solution Place 1 drop into both eyes daily as needed for dry eyes.   Yes Historical Provider, MD  hydrOXYzine (VISTARIL) 50 MG capsule Take 50-100 mg by mouth at bedtime.    Yes Historical Provider, MD  ketoconazole (NIZORAL) 2 % cream Apply 1 application topically 2 (two) times a week. Applied to face   Yes Historical Provider, MD  ketoconazole (NIZORAL) 2 % shampoo Apply 1 application topically daily as needed for irritation. Applied to scalp   Yes Historical Provider, MD  meclizine (ANTIVERT) 25 MG tablet Take  1 tablet (25 mg total) by mouth 3 (three) times daily as needed. Patient taking differently: Take 25 mg by mouth 3 (three) times daily as needed for dizziness.  06/14/14  Yes Tanna Furry, MD  Multiple Vitamin (MULTIVITAMIN WITH MINERALS) TABS tablet Take 1 tablet by mouth daily.   Yes Historical Provider, MD  oxcarbazepine (TRILEPTAL) 600 MG tablet Take 600 mg by mouth 2 (two) times daily.   Yes Historical Provider, MD  oxyCODONE (ROXICODONE) 15 MG immediate release tablet Take 15 mg by mouth 4 (four) times daily.    Yes Historical Provider, MD  pravastatin (PRAVACHOL) 40 MG tablet Take 40 mg by mouth daily.   Yes Historical Provider, MD  solifenacin (  VESICARE) 10 MG tablet Take 10 mg by mouth daily.   Yes Historical Provider, MD  prochlorperazine (COMPAZINE) 10 MG tablet Take 1 tablet (10 mg total) by mouth every 6 (six) hours as needed for nausea or vomiting. Patient not taking: Reported on 10/07/2014 06/15/14   Daleen Bo, MD  prochlorperazine (COMPAZINE) 25 MG suppository Place 1 suppository (25 mg total) rectally every 12 (twelve) hours as needed for nausea or vomiting. Patient not taking: Reported on 10/07/2014 06/15/14   Daleen Bo, MD   BP 136/65 mmHg  Pulse 64  Temp(Src) 98.6 F (37 C)  Resp 18  Ht 5' 8.5" (1.74 m)  Wt 170 lb (77.111 kg)  BMI 25.47 kg/m2  SpO2 97% Physical Exam  0710: Physical examination:  Nursing notes reviewed; Vital signs and O2 SAT reviewed;  Constitutional: Well developed, Well nourished, Well hydrated, In no acute distress; Head:  Normocephalic, atraumatic; Eyes: EOMI, PERRL, No scleral icterus; ENMT: Mouth and pharynx normal, Mucous membranes moist; Neck: Supple, Full range of motion, No lymphadenopathy; Cardiovascular: Regular rate and rhythm, No murmur, rub, or gallop; Respiratory: Breath sounds clear & equal bilaterally, No rales, rhonchi, wheezes.  Speaking full sentences with ease, Normal respiratory effort/excursion; Chest: Nontender, Movement normal;  Abdomen: Soft, Nontender, Nondistended, Normal bowel sounds; Genitourinary: No CVA tenderness; Spine:  No midline CS, TS, LS tenderness. +TTP bilat lumbar paraspinal muscles. No rash.;; Extremities: Pulses normal, No tenderness, No edema, No calf edema or asymmetry.; Neuro: AA&Ox3, Major CN grossly intact.  Speech clear. No gross focal motor or sensory deficits in extremities. Climbs on and off stretcher easily by herself. Gait steady.; Skin: Color normal, Warm, Dry.; Psych:  Anxious, rapid pressured speech.     ED Course  Procedures   [phone]:  Pt now tearful and crying. ED RN and I both re-evaluated pt: states she is "scared" and "my sister lost a kidney" and "I don't want to."  ED RN and I attempted to reassure pt regarding her Udip not showing clear UTI at this time, and that a UC has been obtained and pending. Also explained to pt that I will obtain CT A/P to r/o kidney stone. Pt continues to perseverate regarding her symptoms and that "someone told me that when your urine smells like ammonia you have to be treated for a UTI right away." She has become more and more agitated and tearful.  ED RN and I reiterated her Udip results, and that UC is pending. Pt then stated that she "wanted to make sure she was getting the right care because she was bipolar." Pt requesting to take her own chronic pain meds. After ED RN and I explained pt's results to her 3-4 times, pt continues to state "I need things explained to me so I don't get upset" and again questions her testing results (as compared to "what someone told her" regarding her symptoms). ED RN and I again reiterated above. Pt continues agitated. Pt encouraged to take her home pain/anxiety meds.   1310:  Pt now is calmer after workup is complete, including MRI. Pt thanked ED staff and states she is ready to go home now. Tx symptomatically at this time. PMD to f/u UC. Dx and testing d/w pt.  Questions answered.  Verb understanding, agreeable to d/c home with  outpt f/u.    MDM  MDM Reviewed: previous chart, nursing note and vitals Interpretation: labs   Results for orders placed or performed during the hospital encounter of 11/26/14  Urinalysis, Routine w reflex  microscopic (not at [facility])  Result Value Ref Range   Color, Urine YELLOW YELLOW   APPearance CLOUDY (A) CLEAR   Specific Gravity, Urine 1.010 1.005 - 1.030   pH 7.0 5.0 - 8.0   Glucose, UA NEGATIVE NEGATIVE mg/dL   Hgb urine dipstick NEGATIVE NEGATIVE   Bilirubin Urine NEGATIVE NEGATIVE   Ketones, ur NEGATIVE NEGATIVE mg/dL   Protein, ur NEGATIVE NEGATIVE mg/dL   Urobilinogen, UA 0.2 0.0 - 1.0 mg/dL   Nitrite NEGATIVE NEGATIVE   Leukocytes, UA TRACE (A) NEGATIVE  Urine microscopic-add on  Result Value Ref Range   Squamous Epithelial / LPF FEW (A) RARE   WBC, UA 3-6 <3 WBC/hpf   RBC / HPF 0-2 <3 RBC/hpf   Bacteria, UA FEW (A) RARE  Comprehensive metabolic panel  Result Value Ref Range   Sodium 139 135 - 145 mmol/L   Potassium 4.5 3.5 - 5.1 mmol/L   Chloride 104 101 - 111 mmol/L   CO2 28 22 - 32 mmol/L   Glucose, Bld 140 (H) 65 - 99 mg/dL   BUN 14 6 - 20 mg/dL   Creatinine, Ser 0.71 0.44 - 1.00 mg/dL   Calcium 8.8 (L) 8.9 - 10.3 mg/dL   Total Protein 6.8 6.5 - 8.1 g/dL   Albumin 3.9 3.5 - 5.0 g/dL   AST 16 15 - 41 U/L   ALT 10 (L) 14 - 54 U/L   Alkaline Phosphatase 54 38 - 126 U/L   Total Bilirubin 0.6 0.3 - 1.2 mg/dL   GFR calc non Af Amer >60 >60 mL/min   GFR calc Af Amer >60 >60 mL/min   Anion gap 7 5 - 15  CBC with Differential  Result Value Ref Range   WBC 11.2 (H) 4.0 - 10.5 K/uL   RBC 5.07 3.87 - 5.11 MIL/uL   Hemoglobin 15.9 (H) 12.0 - 15.0 g/dL   HCT 47.4 (H) 36.0 - 46.0 %   MCV 93.5 78.0 - 100.0 fL   MCH 31.4 26.0 - 34.0 pg   MCHC 33.5 30.0 - 36.0 g/dL   RDW 13.1 11.5 - 15.5 %   Platelets 251 150 - 400 K/uL   Neutrophils Relative % 59 43 - 77 %   Neutro Abs 6.6 1.7 - 7.7 K/uL   Lymphocytes Relative 32 12 - 46 %   Lymphs Abs 3.6 0.7 - 4.0 K/uL    Monocytes Relative 7 3 - 12 %   Monocytes Absolute 0.8 0.1 - 1.0 K/uL   Eosinophils Relative 1 0 - 5 %   Eosinophils Absolute 0.2 0.0 - 0.7 K/uL   Basophils Relative 1 0 - 1 %   Basophils Absolute 0.1 0.0 - 0.1 K/uL   Mr Liver W Wo Contrast 11/26/2014   CLINICAL DATA:  Indeterminate lesion within the liver on CT exam. MRI recommended for further evaluation. Patient presented to the emergency department for acute left flank pain.  EXAM: MRI ABDOMEN WITHOUT AND WITH CONTRAST  TECHNIQUE: Multiplanar multisequence MR imaging of the abdomen was performed both before and after the administration of intravenous contrast.  CONTRAST:  61mL MULTIHANCE GADOBENATE DIMEGLUMINE 529 MG/ML IV SOLN  COMPARISON:  CT 11/26/2014  FINDINGS: Lower chest:  Lung bases are clear.  Hepatobiliary: Within the medial aspect of the inferior left hepatic lobe (segment 4B) there is a 2.8 cm lesion which corresponds the indeterminate lesion on comparison CT. This lesion demonstrates marked loss of signal intensity on the opposed phase imaging (series 12 and series 13,  image 36) which is consistent with lipid. Findings are consistent with focal fatty infiltration of the liver. This lesion demonstrates no restricted diffusion. Lesion is slightly hyper perfused compared to the normal liver parenchyma.  No biliary duct dilatation. The gallbladder is normal. The common bile duct normal.  Pancreas: Normal pancreatic parenchymal intensity. No ductal dilatation or inflammation.  Spleen: Normal spleen.  Adrenals/urinary tract: Bosniak 1 renal cyst of the right kidney. No enhancing renal lesion. Adrenal glands are normal.  Stomach/Bowel: Stomach and limited of the small bowel is unremarkable  Vascular/Lymphatic: Abdominal aortic normal caliber. No retroperitoneal periportal lymphadenopathy.  Musculoskeletal: No aggressive osseous lesion  IMPRESSION: 1. Focal fatty infiltration in the left hepatic lobe corresponds to the indeterminate lesion on  comparison CT. This is a benign finding. 2. Bosniak 1 renal cyst of the right kidney.  Findings conveyed toKATHLEEN Kasheena Sambrano on 11/26/2014  at12:41.   Electronically Signed   By: Suzy Bouchard M.D.   On: 11/26/2014 12:41   Ct Renal Stone Study  11/26/2014   CLINICAL DATA:  Acute left flank pain.  EXAM: CT ABDOMEN AND PELVIS WITHOUT CONTRAST  TECHNIQUE: Multidetector CT imaging of the abdomen and pelvis was performed following the standard protocol without IV contrast.  COMPARISON:  CT scan of January 19, 2011.  FINDINGS: Severe degenerative disc disease is noted at L5-S1. Visualized lung bases appear normal.  No gallstones are noted. 3.1 x 2.2 cm new low density is seen in the left hepatic lobe posteriorly. The spleen and pancreas appear normal. Adrenal glands appear normal. No hydronephrosis or renal obstruction is noted. No renal or ureteral calculi are noted. The appendix appears normal. There is no evidence of bowel obstruction. No abnormal fluid collection is noted. Atherosclerosis of abdominal aorta is noted without aneurysm formation. Urinary bladder appears normal. No significant adenopathy is noted. Status post hysterectomy.  IMPRESSION: No hydronephrosis or renal obstruction is noted. No renal or ureteral calculi are noted.  Interval development of 3.1 x 2.2 cm low density is noted in the left hepatic lobe. This is concerning for possible neoplasm, and further evaluation with MRI of the liver is recommended.   Electronically Signed   By: Marijo Conception, M.D.   On: 11/26/2014 08:42           Francine Graven, DO 11/29/14 [id number]

## 2014-11-26 NOTE — ED Notes (Addendum)
Pt reports generalized back pain,urinary frequency,weakness x1 week. Pt denies any gi symptoms.

## 2014-11-26 NOTE — Discharge Instructions (Signed)
°Emergency Department Resource Guide °1) Find a Doctor and Pay Out of Pocket °Although you won't have to find out who is covered by your insurance plan, it is a good idea to ask around and get recommendations. You will then need to call the office and see if the doctor you have chosen will accept you as a new patient and what types of options they offer for patients who are self-pay. Some doctors offer discounts or will set up payment plans for their patients who do not have insurance, but you will need to ask so you aren't surprised when you get to your appointment. ° °2) Contact Your Local Health Department °Not all health departments have doctors that can see patients for sick visits, but many do, so it is worth a call to see if yours does. If you don't know where your local health department is, you can check in your phone book. The CDC also has a tool to help you locate your state's health department, and many state websites also have listings of all of their local health departments. ° °3) Find a Walk-in Clinic °If your illness is not likely to be very severe or complicated, you may want to try a walk in clinic. These are popping up all over the country in pharmacies, drugstores, and shopping centers. They're usually staffed by nurse practitioners or physician assistants that have been trained to treat common illnesses and complaints. They're usually fairly quick and inexpensive. However, if you have serious medical issues or chronic medical problems, these are probably not your best option. ° °No Primary Care Doctor: °- Call [facility] Connect at  832-8000 - they can help you locate a primary care doctor that  accepts your insurance, provides certain services, etc. °- Physician Referral Service- 1-800-533-3463 ° °Chronic Pain Problems: °Organization         Address  Phone   Notes  °[facility]  (336) 297-2271 Patients need to be referred by their primary care doctor.  ° °Medication  Assistance: °Organization         Address  Phone   Notes  °[place] [facility] [address] °[place], [place] [address] (336) 641-8030 --Must be a resident of [place] °-- Must have NO insurance coverage whatsoever (no Medicaid/ Medicare, etc.) °-- The pt. MUST have a primary care doctor that directs their care regularly and follows them in the community °  °[facility]  (866) 331-1348   °[employer]  (888) 892-1162   ° °Agencies that provide inexpensive medical care: °Organization         Address  Phone   Notes  °[facility]  (336) 832-8035   ° Internal Medicine    (336) 832-7272   °[facility] [address] °[place], [place] [address] (336) 832-4777   °[facility] [place] [address], °[place] (336) 271-4999   °[facility]    (336) 373-0678   °[facility]    (336) 272-1050   °[facility] ° [address], [place] Phone:  (336) 832-4444, Fax:  (336) 832-4440 Hours of Operation:  9 am - 6 pm, M-F.  Also accepts Medicaid/Medicare and self-pay.  °[facility] ° [address], [place] Phone: (336) [phone], Fax: (336) [phone]. Hours of Operation:  8:30 am - 5:30 pm, M-F.  Also accepts Medicaid and self-pay.  °[facility] [place] [address], [place] Phone: (336) 878-6027   °[facility] [address], [place], [place] (336)723-1848, Ext. 123 Mondays & Thursdays: 7-9 AM.  First 15 patients are seen on a first come, first serve basis. °  ° °Medicaid-accepting [place] Providers: ° °Organization         Address  Phone   Notes  °[facility] [address], [place] (336) 641-2100 Also accepts self-pay patients.  °[facility] [address], [place] ° (336) 856-9996   °[facility] [address], [place]  (336) 288-8857   °[facility] [address] [place] [address], [place] (336) 299-7000   °Veita Bland [address], [place]  ° (336) 373-1557 Only accepts [place] Access Medicaid patients after they have their name applied to their card.  ° °Self-Pay (no insurance) in [place]: ° °Organization         Address  Phone   Notes  °Sickle Cell Patients, [facility] [address], [place] (336) 832-1970   °[facility] [address], [place] (336) 832-4400   °[facility] ° [address], [place] (336) 992-4800   °[facility]/Dr. Osei-Bonsu ° [address] [place] [address], [place] or [address], [place] (336) 841-8500 Phone number for both [place] and [place] locations is the same.  °[facility] [address], [place] (336) 299-0000   °[facility] [address] [place] [address], [place] or [address] (336) 852-7530 °(336) 878-2260   °[facility] [address], [place] (336) 350-1642, phone; (336) 294-5005, fax Sees patients 1st and 3rd Saturday of every month.  Must not qualify for public or private insurance (i.e. Medicaid, Medicare, [place] Health Choice, Veterans' Benefits) • Household income should be no more than 200% of the poverty level •The clinic cannot treat you if you are pregnant or think you are pregnant • Sexually transmitted diseases are not treated at the clinic.  ° ° °Dental Care: °Organization         Address  Phone  Notes  °[place] [facility] [address], [place] (336) 641-6152 Accepts children up to age 21 who are enrolled in Medicaid or [place] Health Choice; pregnant women with a Medicaid card; and children who have applied for Medicaid or [place] Health Choice, but were declined, whose parents can pay a reduced fee at time of service.  °[place]  [facility] [place]  [address], [place] (336) 641-7733 Accepts children up to age 21 who are enrolled in Medicaid or [place] Health Choice; pregnant women with a Medicaid card; and children who have applied for Medicaid or [place] Health Choice, but were declined, whose parents can pay a reduced fee at time of service.  °[facility] ° [address], [place] (336) 641-4533 Patients are seen by appointment only. Walk-ins are not accepted. [facility] will see patients 18 years of age and older. °Monday - Tuesday (8am-5pm) °Most Wednesdays (8:30-5pm) °$30 per visit, cash only  °[facility] ° [address], [place] (336) 641-4533 Patients are seen by appointment only. Walk-ins are not accepted. [facility] will see patients 18 years of age and older. °One   Wednesday Evening (Monthly: Volunteer Based).  $30 per visit, cash only  °[facility]  (919) 537-3737 for adults; Children under age 4, call Graduate Pediatric Dentistry at ([phone]) [phone]. Children aged 4-14, please call (919) [phone] to request a pediatric application. ° Dental services are provided in all areas of dental care including fillings, crowns and bridges, complete and partial dentures, implants, gum treatment, root canals, and extractions. Preventive care is also provided. Treatment is provided to both adults and children. °Patients are selected via a lottery and there is often a waiting list. °  °[facility] [address], °[place] ° (336) 763-8833 www.drcivils.com °  °[facility] [address], [place], [place] (336)723-1848, Ext. 123 Second and Fourth Thursday of each month, opens at 6:30 AM; Clinic ends at 9 AM.  Patients are seen on a first-come first-served basis, and a limited number are seen during each clinic.  ° °[facility] ° [address], [place], [place] (336) 723-7904    Eligibility Requirements °You must have lived in [place], or [place] counties for at least the last three months. °  You cannot be eligible for state or federal sponsored healthcare insurance, including Veterans Administration, Medicaid, or Medicare. °  You generally cannot be eligible for healthcare insurance through your employer.  °  How to apply: °Eligibility screenings are held every Tuesday and Wednesday afternoon from 1:00 pm until 4:00 pm. You do not need an appointment for the interview!  °[facility] [address], [place], [place] 336-631-2330   °[facility]  336-342-8273   °[facility]  336-703-3100   °[facility]  336-570-6415   ° °Behavioral Health Resources in the Community: °Intensive Outpatient Programs °Organization         Address  Phone  Notes  °[facility] [address], [place], [place] 336-878-6098   °[facility] [address], [place], [place] 336-832-9800   °[facility] [address], [place], [place] ° 336-882-2125   °[facility] [address],  °[place], [place] 1-800-853-5163 or 336-641-4981   °Substance Abuse Resources °Organization         Address  Phone  Notes  °[facility]  336-882-2125   °[facility]  336-784-9470   °[facility]  336-285-9073   °[facility]  336-845-3988   °Residential & Outpatient Substance Abuse Program  1-800-659-3381   °Psychological Services °Organization         Address  Phone  Notes  °[facility]  336- 832-9600   °[facility]  336- 378-7881   °[facility] [address], [place] 1-800-853-5163 or 336-641-4981   ° °Mobile Crisis Teams °Organization         Address  Phone  Notes  °[facility], Mobile Crisis Care Unit  1-877-626-1772   °Assertive °[facility] ° [address]  [place], [place] 336-834-9664   °Sharon DeEsch [address] °[place] 336-554-5454   ° °Self-Help/Support Groups °Organization         Address  Phone             Notes  °[facility] - variety of support groups  336- 373-1402 Call for more information  °Narcotics Anonymous (NA), [facility] [address], °[place]  2 meetings at this location  ° °  Residential Treatment Programs Organization         Address  Phone  Notes  [facility] [address],    [place]  1-708-158-7190   [facility]  [address] [place] [address], [place], [place]   [place], [place] [phone] Admissions: 8am-3pm M-F  [facility] [address],    [place], [place] 035-465-6812   [facility] [address] [place], [place], [place]   [facility] [address]  [place], [place]   [facility] Intensive Outpatient [address], [place], [place]   [facility] ([place].,  [place], [place] 1-864 769 0934 or 210-269-1849   [facility] [address], [place], [place] Accepts Medicaid  [facility] [address]  [place] 1-765-747-4352 Substance Abuse/Addiction Treatment   [facility]         Address  Phone  Notes  [facility]  667-059-6676   Domenic Schwab, PhD [address] [place], [place]   609-398-6923 or 878-009-1883   [facility] [place], [place] 619-071-5680   [facility] [address], [place], [place] 216-085-0078 Insurance/Medicaid/sponsorship through [facility] and Families [address]                                    [place], [place] [phone] [place] [address], [place] [phone]    Dr. Adele Schilder  [phone]   [facility] Dept. 1) [address] [place] 2) [place] 3)  [place] [address], [place] [phone]  [phone]   [place] 303-146-6864 or [phone] (After Hours)      Take the prescription as directed.  Apply moist heat or ice to the area(s) of discomfort, for 15 minutes at a time, several times per day for the next few days.  Do not fall asleep on a heating or ice pack. Your urine culture is pending results. Call your regular medical doctor on Monday to schedule a follow up appointment in the next 3 days to obtain the results.  Return to the Emergency Department immediately if worsening.

## 2014-11-26 NOTE — ED Notes (Signed)
Patient denies pain and is resting comfortably.  

## 2014-11-26 NOTE — ED Notes (Signed)
Pt continue to wait for MRI result

## 2014-11-26 NOTE — ED Notes (Signed)
Pt states she had to get up and walk around. Pt was reading the bulletin board and then walked out. Security aware and walked pt back to her room

## 2014-11-28 LAB — URINE CULTURE: Culture: >=100000

## 2014-11-29 ENCOUNTER — Telehealth (HOSPITAL_BASED_OUTPATIENT_CLINIC_OR_DEPARTMENT_OTHER): Payer: Self-pay | Admitting: Emergency Medicine

## 2014-11-29 NOTE — Progress Notes (Signed)
ED Antimicrobial Stewardship Positive Culture Follow Up   Cynthia Deleon is an 59 y.o. female who presented to [facility] on 11/26/2014 with a chief complaint of  Chief Complaint  Patient presents with  . Back Pain    Recent Results (from the past 720 hour(s))  Urine culture     Status: None   Collection Time: 11/26/14  6:59 AM  Result Value Ref Range Status   Specimen Description URINE, CLEAN CATCH  Final   Special Requests NONE  Final   Culture   Final    >=100,000 COLONIES/mL ESCHERICHIA COLI Performed at [facility]    Report Status 11/28/2014 FINAL  Final   Organism ID, Bacteria ESCHERICHIA COLI  Final      Susceptibility   Escherichia coli - MIC*    AMPICILLIN 8 SENSITIVE Sensitive     CEFTAZIDIME <=1 SENSITIVE Sensitive     CEFTRIAXONE <=1 SENSITIVE Sensitive     CIPROFLOXACIN <=0.25 SENSITIVE Sensitive     GENTAMICIN <=1 SENSITIVE Sensitive     IMIPENEM <=0.25 SENSITIVE Sensitive     NITROFURANTOIN <=16 SENSITIVE Sensitive     TRIMETH/SULFA <=20 SENSITIVE Sensitive     AMPICILLIN/SULBACTAM 4 SENSITIVE Sensitive     PIP/TAZO <=4 SENSITIVE Sensitive     * >=100,000 COLONIES/mL ESCHERICHIA COLI    [x]  Patient discharged originally without antimicrobial agent and treatment is now indicated  New antibiotic prescription: Cephalexin 500mg  PO TID x 5 days  ED Provider: Delsa Grana, PA-C   Norva Riffle 11/29/2014, 9:58 AM Infectious Diseases Pharmacist Phone# [phone]

## 2014-11-29 NOTE — Telephone Encounter (Signed)
Post ED Visit - Positive Culture Follow-up: Successful Patient Follow-Up  Culture assessed and recommendations reviewed by: []  Heide Guile, Pharm.D., BCPS-AQ ID []  Alycia Rossetti, Pharm.D., BCPS []  [place], Pharm.D., BCPS, AAHIVP [x]  Legrand Como, Pharm.D., BCPS, AAHIVP []  Tegan Magsam, Pharm.D. []  Milus Glazier, Pharm.D.  Positive urine  Culture E. Coli  [x]  Patient discharged without antimicrobial prescription and treatment is now indicated []  Organism is resistant to prescribed ED discharge antimicrobial []  Patient with positive blood cultures  Changes discussed with ED provider: Delsa Grana PA New antibiotic prescription Cephalexin 500mg  po tid x 5 days Called to [facility] @ [phone]  Contacted patient, 11/29/14 1623  Hazle Nordmann 11/29/2014, 4:21 PM

## 2014-12-16 ENCOUNTER — Other Ambulatory Visit: Payer: Self-pay | Admitting: Physician Assistant

## 2014-12-16 ENCOUNTER — Ambulatory Visit
Admission: RE | Admit: 2014-12-16 | Discharge: 2014-12-16 | Disposition: A | Discharge status: Home / Self Care | Payer: Medicaid Other | Source: Ambulatory Visit | Attending: Physician Assistant | Admitting: Physician Assistant

## 2014-12-16 DIAGNOSIS — R1030 Lower abdominal pain, unspecified: Secondary | ICD-10-CM

## 2014-12-19 ENCOUNTER — Ambulatory Visit (HOSPITAL_COMMUNITY)
Admission: RE | Admit: 2014-12-19 | Discharge: 2014-12-19 | Disposition: A | Discharge status: Home / Self Care | Payer: Medicaid Other | Source: Ambulatory Visit | Attending: Family Medicine | Admitting: Family Medicine

## 2014-12-19 DIAGNOSIS — Z1231 Encounter for screening mammogram for malignant neoplasm of breast: Secondary | ICD-10-CM | POA: Insufficient documentation

## 2015-01-26 ENCOUNTER — Other Ambulatory Visit: Payer: Self-pay | Admitting: Adult Health

## 2015-02-04 ENCOUNTER — Emergency Department (HOSPITAL_COMMUNITY): Payer: Medicaid Other

## 2015-02-04 ENCOUNTER — Other Ambulatory Visit (HOSPITAL_COMMUNITY): Payer: Self-pay

## 2015-02-04 ENCOUNTER — Emergency Department (HOSPITAL_COMMUNITY)
Admission: EM | Admit: 2015-02-04 | Discharge: 2015-02-04 | Disposition: A | Discharge status: Home / Self Care | Payer: Medicaid Other | Attending: Emergency Medicine | Admitting: Emergency Medicine

## 2015-02-04 ENCOUNTER — Encounter (HOSPITAL_COMMUNITY): Payer: Self-pay | Admitting: Emergency Medicine

## 2015-02-04 DIAGNOSIS — Z87891 Personal history of nicotine dependence: Secondary | ICD-10-CM | POA: Diagnosis not present

## 2015-02-04 DIAGNOSIS — Z87442 Personal history of urinary calculi: Secondary | ICD-10-CM | POA: Diagnosis not present

## 2015-02-04 DIAGNOSIS — M1990 Unspecified osteoarthritis, unspecified site: Secondary | ICD-10-CM | POA: Diagnosis not present

## 2015-02-04 DIAGNOSIS — Y9289 Other specified places as the place of occurrence of the external cause: Secondary | ICD-10-CM | POA: Insufficient documentation

## 2015-02-04 DIAGNOSIS — S93601A Unspecified sprain of right foot, initial encounter: Secondary | ICD-10-CM | POA: Diagnosis not present

## 2015-02-04 DIAGNOSIS — Y998 Other external cause status: Secondary | ICD-10-CM | POA: Insufficient documentation

## 2015-02-04 DIAGNOSIS — G4730 Sleep apnea, unspecified: Secondary | ICD-10-CM | POA: Insufficient documentation

## 2015-02-04 DIAGNOSIS — Z8711 Personal history of peptic ulcer disease: Secondary | ICD-10-CM | POA: Insufficient documentation

## 2015-02-04 DIAGNOSIS — Z9981 Dependence on supplemental oxygen: Secondary | ICD-10-CM | POA: Diagnosis not present

## 2015-02-04 DIAGNOSIS — Y9389 Activity, other specified: Secondary | ICD-10-CM | POA: Diagnosis not present

## 2015-02-04 DIAGNOSIS — Z8742 Personal history of other diseases of the female genital tract: Secondary | ICD-10-CM | POA: Diagnosis not present

## 2015-02-04 DIAGNOSIS — Z79899 Other long term (current) drug therapy: Secondary | ICD-10-CM | POA: Diagnosis not present

## 2015-02-04 DIAGNOSIS — F319 Bipolar disorder, unspecified: Secondary | ICD-10-CM | POA: Diagnosis not present

## 2015-02-04 DIAGNOSIS — Z872 Personal history of diseases of the skin and subcutaneous tissue: Secondary | ICD-10-CM | POA: Diagnosis not present

## 2015-02-04 DIAGNOSIS — I10 Essential (primary) hypertension: Secondary | ICD-10-CM | POA: Diagnosis not present

## 2015-02-04 DIAGNOSIS — W1839XA Other fall on same level, initial encounter: Secondary | ICD-10-CM | POA: Insufficient documentation

## 2015-02-04 DIAGNOSIS — F410 Panic disorder [episodic paroxysmal anxiety] without agoraphobia: Secondary | ICD-10-CM | POA: Insufficient documentation

## 2015-02-04 DIAGNOSIS — G8929 Other chronic pain: Secondary | ICD-10-CM | POA: Insufficient documentation

## 2015-02-04 DIAGNOSIS — M79673 Pain in unspecified foot: Secondary | ICD-10-CM

## 2015-02-04 DIAGNOSIS — Z8719 Personal history of other diseases of the digestive system: Secondary | ICD-10-CM | POA: Insufficient documentation

## 2015-02-04 DIAGNOSIS — S99921A Unspecified injury of right foot, initial encounter: Secondary | ICD-10-CM | POA: Diagnosis present

## 2015-02-04 DIAGNOSIS — J449 Chronic obstructive pulmonary disease, unspecified: Secondary | ICD-10-CM | POA: Diagnosis not present

## 2015-02-04 DIAGNOSIS — Z8619 Personal history of other infectious and parasitic diseases: Secondary | ICD-10-CM | POA: Diagnosis not present

## 2015-02-04 NOTE — ED Notes (Signed)
MD at bedside. 

## 2015-02-04 NOTE — ED Provider Notes (Signed)
CSN: [id number]     Arrival date & time 02/04/15  7846 History  This chart was scribed for Cynthia Fraise, MD by Starleen Arms, ED Scribe. This patient was seen in room APA17/APA17 and the patient's care was started at 10:18 AM.   Chief Complaint  Patient presents with  . Foot Pain   The history is provided by the patient. No language interpreter was used.   HPI Comments: Cynthia Deleon is a 59 y.o. female who presents to the Emergency Department complaining of constant right foot pain onset 2 days ago after a fall at [facility].  During the fall the patient was squatting and fell backward, landing on her buttocks, and twisting her right foot.  She denies head trauma, LOC, changes in quality of her chronic neck/back pain.  Patient is under pain management for chronic neck and back pain.  Past Medical History  Diagnosis Date  . Bipolar 1 disorder Degenerative disk disease  . Arthritis     hands and knees  . Panic attacks   . Chronic back pain   . Incontinence   . Chronic neck pain   . Multiple personality disorder   . Depression   . Sleep apnea     uses a cpap-with oxygen  . Complication of anesthesia     high anxiety-does not want to be alone  . COPD (chronic obstructive pulmonary disease)   . Hot flashes 12/15/2013  . Peptic ulcer   . Rosacea   . Shingles   . IBS (irritable bowel syndrome)   . Current use of estrogen therapy 01/12/2014  . Kidney stone   . Hypertension    Past Surgical History  Procedure Laterality Date  . Abdominal hysterectomy    . Tonsillectomy    . Foot arthrodesis  2000    both feet  . Bunionectomy with hammertoe reconstruction Right 12/10/2012    Procedure: RIGHT FIRST METATARSAL CHEVRON BUNION CORRECTION,  2 AND 3 HAMMERTOE CORRECTION , RIGHT 3 AND 4 TOE NAIL EXCISION ;  Surgeon: Wylene Simmer, MD;  Location: [facility];  Service: Orthopedics;  Laterality: Right;  . Rectal surgery     Family History  Problem Relation Age of Onset  .  Diabetes Mother   . Other Mother     vertigo; chronic eye disease  . COPD Father   . COPD Sister   . Alcohol abuse Brother   . Diabetes Maternal Grandmother   . Diabetes Maternal Grandfather   . Diabetes Sister   . Other Sister     hearing problems  . Hyperlipidemia Sister   . Alcohol abuse Brother   . COPD Brother   . Alcohol abuse Brother   . Alcohol abuse Brother   . Other Brother     aneursym   Social History  Substance Use Topics  . Smoking status: Former Smoker -- 0.00 packs/day for 35 years    Types: Cigarettes    Quit date: 10/19/2012  . Smokeless tobacco: Never Used  . Alcohol Use: No   OB History    Gravida Para Term Preterm AB TAB SAB Ectopic Multiple Living   5 5        5      Review of Systems  Constitutional: Negative for fever.  Musculoskeletal: Positive for joint swelling and arthralgias.      Allergies  Chantix; Codeine; Divalproex sodium; Hydrocodone; Paxil; and Sulfur  Home Medications   Prior to Admission medications   Medication Sig Start Date End  Date Taking? Authorizing Provider  albuterol (PROVENTIL HFA;VENTOLIN HFA) 108 (90 BASE) MCG/ACT inhaler Inhale 1 puff into the lungs every 6 (six) hours as needed. For shortness of breath. 11/25/12   Encarnacion Slates, NP  Brimonidine Tartrate (MIRVASO) 0.33 % GEL Apply 1 application topically every morning. Applied to face    Historical Provider, MD  busPIRone (BUSPAR) 15 MG tablet Take 30 mg by mouth at bedtime.    Historical Provider, MD  cyclobenzaprine (FLEXERIL) 10 MG tablet Take 10 mg by mouth 2 (two) times daily as needed for muscle spasms.     Historical Provider, MD  desonide (DESOWEN) 0.05 % cream Apply 1 application topically 2 (two) times daily as needed (applied to face). Applied to face    Historical Provider, MD  diazepam (VALIUM) 5 MG tablet Take 1 tablet (5 mg total) by mouth every 8 (eight) hours as needed (vertigo). 06/14/14   Tanna Furry, MD  esomeprazole (NEXIUM) 40 MG capsule Take 40 mg  by mouth 2 (two) times daily before a meal.     Historical Provider, MD  hydrocortisone (ANUSOL-HC) 2.5 % rectal cream Place 1 application rectally 2 (two) times daily as needed for itching.     Historical Provider, MD  hydroxypropyl methylcellulose (ISOPTO TEARS) 2.5 % ophthalmic solution Place 1 drop into both eyes daily as needed for dry eyes.    Historical Provider, MD  hydrOXYzine (VISTARIL) 50 MG capsule Take 50-100 mg by mouth at bedtime.     Historical Provider, MD  ketoconazole (NIZORAL) 2 % cream Apply 1 application topically 2 (two) times a week. Applied to face    Historical Provider, MD  ketoconazole (NIZORAL) 2 % shampoo Apply 1 application topically daily as needed for irritation. Applied to scalp    Historical Provider, MD  meclizine (ANTIVERT) 25 MG tablet Take 1 tablet (25 mg total) by mouth 3 (three) times daily as needed. Patient taking differently: Take 25 mg by mouth 3 (three) times daily as needed for dizziness.  06/14/14   Tanna Furry, MD  methocarbamol (ROBAXIN) 500 MG tablet Take 2 tablets (1,000 mg total) by mouth 4 (four) times daily as needed for muscle spasms (muscle spasm/pain). 11/26/14   Francine Graven, DO  Multiple Vitamin (MULTIVITAMIN WITH MINERALS) TABS tablet Take 1 tablet by mouth daily.    Historical Provider, MD  oxcarbazepine (TRILEPTAL) 600 MG tablet Take 600 mg by mouth 2 (two) times daily.    Historical Provider, MD  oxyCODONE (ROXICODONE) 15 MG immediate release tablet Take 15 mg by mouth 4 (four) times daily.     Historical Provider, MD  pravastatin (PRAVACHOL) 40 MG tablet Take 40 mg by mouth daily.    Historical Provider, MD  prochlorperazine (COMPAZINE) 10 MG tablet Take 1 tablet (10 mg total) by mouth every 6 (six) hours as needed for nausea or vomiting. Patient not taking: Reported on 10/07/2014 06/15/14   Daleen Bo, MD  prochlorperazine (COMPAZINE) 25 MG suppository Place 1 suppository (25 mg total) rectally every 12 (twelve) hours as needed for  nausea or vomiting. Patient not taking: Reported on 10/07/2014 06/15/14   Daleen Bo, MD  solifenacin (VESICARE) 10 MG tablet Take 10 mg by mouth daily.    Historical Provider, MD  VIVELLE-DOT 0.075 MG/24HR APPLY 1 PATCH TWICE A WEEK. 01/26/15   Estill Dooms, NP   BP 116/60 mmHg  Pulse 76  Temp(Src) 97.8 F (36.6 C) (Oral)  Ht 5\' 9"  (1.753 m)  Wt 165 lb (74.844 kg)  BMI  24.36 kg/m2  SpO2 96% Physical Exam  Nursing note and vitals reviewed.  CONSTITUTIONAL: Well developed/well nourished HEAD: Normocephalic/atraumatic EYES: EOMI/PERRL ENMT: Mucous membranes moist NECK: supple no meningeal signs SPINE/BACK:entire spine nontender CV: S1/S2 noted, no murmurs/rubs/gallops noted LUNGS: Lungs are clear to auscultation bilaterally, no apparent distress ABDOMEN: soft, nontender, no rebound or guarding, bowel sounds noted throughout abdomen NEURO: Pt is awake/alert/appropriate, moves all extremitiesx4.  No facial droop.   EXTREMITIES: pulses normal/equal, full ROM; mild tenderness to dorsal aspect of right foot, swelling noted, no bruising, no deformity, post-surgical changes noted.  SKIN: warm, color normal PSYCH: no abnormalities of mood noted, alert and oriented to situation  ED Course  Procedures   DIAGNOSTIC STUDIES: Oxygen Saturation is 96% on RA, normal by my interpretation.    COORDINATION OF CARE:  10:29 AM Will order imaging of right foot.  Patient acknowledges and agrees with plan.    Probable foot sprain Stable for d/c home  Imaging Review Dg Foot Complete Right  02/04/2015   CLINICAL DATA:  [place] [facility] 2 days ago, pain and swelling at distal first and second metatarsal region, remote foot surgery, initial encounter  EXAM: RIGHT FOOT COMPLETE - 3+ VIEW  COMPARISON:  None  FINDINGS: Osseous demineralization.  Screws present at RIGHT distal first and third metatarsals post ORIF.  Prior bunionectomy first metatarsal.  Joint spaces preserved.  No acute fracture,  dislocation or bone destruction.  IMPRESSION: Prior ORIF of distal RIGHT first and third metatarsals.  Osseous demineralization without acute bony abnormalities.   Electronically Signed   By: Lavonia Dana M.D.   On: 02/04/2015 11:23   I have personally reviewed and evaluated these images results as part of my medical decision-making.    MDM   Final diagnoses:  Sprain of right foot, initial encounter    Nursing notes including past medical history and social history reviewed and considered in documentation xrays/imaging reviewed by myself and considered during evaluation   I personally performed the services described in this documentation, which was scribed in my presence. The recorded information has been reviewed and is accurate.      Cynthia Fraise, MD 02/04/15 1328

## 2015-02-04 NOTE — ED Notes (Signed)
Patient c/o right foot pain. Per patient fell at [place]. Denies hitting head or LOC. Patient reports landing on buttock. Patient report increase in chronic neck and back pain but patient states she goes to pain clinic.

## 2015-03-17 ENCOUNTER — Other Ambulatory Visit: Payer: Self-pay | Admitting: Gastroenterology

## 2015-04-20 ENCOUNTER — Other Ambulatory Visit: Payer: Self-pay | Admitting: *Deleted

## 2015-06-06 ENCOUNTER — Encounter (HOSPITAL_COMMUNITY): Payer: Self-pay | Admitting: Cardiology

## 2015-06-06 ENCOUNTER — Emergency Department (HOSPITAL_COMMUNITY)
Admission: EM | Admit: 2015-06-06 | Discharge: 2015-06-06 | Disposition: A | Discharge status: Home / Self Care | Payer: Medicaid Other | Attending: Emergency Medicine | Admitting: Emergency Medicine

## 2015-06-06 ENCOUNTER — Emergency Department (HOSPITAL_COMMUNITY): Payer: Medicaid Other

## 2015-06-06 DIAGNOSIS — J441 Chronic obstructive pulmonary disease with (acute) exacerbation: Secondary | ICD-10-CM | POA: Diagnosis not present

## 2015-06-06 DIAGNOSIS — Z87442 Personal history of urinary calculi: Secondary | ICD-10-CM | POA: Insufficient documentation

## 2015-06-06 DIAGNOSIS — Z8711 Personal history of peptic ulcer disease: Secondary | ICD-10-CM | POA: Diagnosis not present

## 2015-06-06 DIAGNOSIS — Z79899 Other long term (current) drug therapy: Secondary | ICD-10-CM | POA: Insufficient documentation

## 2015-06-06 DIAGNOSIS — G4730 Sleep apnea, unspecified: Secondary | ICD-10-CM | POA: Diagnosis not present

## 2015-06-06 DIAGNOSIS — I10 Essential (primary) hypertension: Secondary | ICD-10-CM | POA: Insufficient documentation

## 2015-06-06 DIAGNOSIS — F319 Bipolar disorder, unspecified: Secondary | ICD-10-CM | POA: Diagnosis not present

## 2015-06-06 DIAGNOSIS — Z872 Personal history of diseases of the skin and subcutaneous tissue: Secondary | ICD-10-CM | POA: Diagnosis not present

## 2015-06-06 DIAGNOSIS — G8929 Other chronic pain: Secondary | ICD-10-CM | POA: Diagnosis not present

## 2015-06-06 DIAGNOSIS — Z9981 Dependence on supplemental oxygen: Secondary | ICD-10-CM | POA: Insufficient documentation

## 2015-06-06 DIAGNOSIS — Z87891 Personal history of nicotine dependence: Secondary | ICD-10-CM | POA: Insufficient documentation

## 2015-06-06 DIAGNOSIS — F410 Panic disorder [episodic paroxysmal anxiety] without agoraphobia: Secondary | ICD-10-CM | POA: Insufficient documentation

## 2015-06-06 DIAGNOSIS — M1990 Unspecified osteoarthritis, unspecified site: Secondary | ICD-10-CM | POA: Insufficient documentation

## 2015-06-06 DIAGNOSIS — Z8742 Personal history of other diseases of the female genital tract: Secondary | ICD-10-CM | POA: Diagnosis not present

## 2015-06-06 DIAGNOSIS — Z8619 Personal history of other infectious and parasitic diseases: Secondary | ICD-10-CM | POA: Insufficient documentation

## 2015-06-06 DIAGNOSIS — R0602 Shortness of breath: Secondary | ICD-10-CM | POA: Diagnosis present

## 2015-06-06 HISTORY — DX: Headache: R51

## 2015-06-06 HISTORY — DX: Headache, unspecified: R51.9

## 2015-06-06 LAB — BASIC METABOLIC PANEL
ANION GAP: 11 (ref 5–15)
BUN: 12 mg/dL (ref 6–20)
CHLORIDE: 105 mmol/L (ref 101–111)
CO2: 24 mmol/L (ref 22–32)
Calcium: 9.6 mg/dL (ref 8.9–10.3)
Creatinine, Ser: 0.66 mg/dL (ref 0.44–1.00)
GFR calc Af Amer: >60 mL/min (ref >60)
GFR calc non Af Amer: >60 mL/min (ref >60)
GLUCOSE: 85 mg/dL (ref 65–99)
POTASSIUM: 3.8 mmol/L (ref 3.5–5.1)
Sodium: 140 mmol/L (ref 135–145)

## 2015-06-06 LAB — CBC WITH DIFFERENTIAL/PLATELET
BASOS PCT: 1 %
Basophils Absolute: 0.1 10*3/uL (ref 0.0–0.1)
EOS ABS: 0.1 10*3/uL (ref 0.0–0.7)
EOS PCT: 2 %
HCT: 49.1 % — ABNORMAL HIGH (ref 36.0–46.0)
Hemoglobin: 16.5 g/dL — ABNORMAL HIGH (ref 12.0–15.0)
LYMPHS ABS: 2.5 10*3/uL (ref 0.7–4.0)
Lymphocytes Relative: 33 %
MCH: 31.7 pg (ref 26.0–34.0)
MCHC: 33.6 g/dL (ref 30.0–36.0)
MCV: 94.4 fL (ref 78.0–100.0)
Monocytes Absolute: 0.6 10*3/uL (ref 0.1–1.0)
Monocytes Relative: 8 %
Neutro Abs: 4.5 10*3/uL (ref 1.7–7.7)
Neutrophils Relative %: 56 %
PLATELETS: 250 10*3/uL (ref 150–400)
RBC: 5.2 MIL/uL — AB (ref 3.87–5.11)
RDW: 13.2 % (ref 11.5–15.5)
WBC: 7.8 10*3/uL (ref 4.0–10.5)

## 2015-06-06 LAB — LACTIC ACID, PLASMA
Lactic Acid, Venous: 1.1 mmol/L (ref 0.5–2.0)
Lactic Acid, Venous: 1.1 mmol/L (ref 0.5–2.0)

## 2015-06-06 LAB — RAPID STREP SCREEN (MED CTR MEBANE ONLY): STREPTOCOCCUS, GROUP A SCREEN (DIRECT): NEGATIVE

## 2015-06-06 LAB — CBG MONITORING, ED: Glucose-Capillary: 91 mg/dL (ref 65–99)

## 2015-06-06 LAB — TROPONIN I: Troponin I: <0.03 ng/mL (ref <0.031)

## 2015-06-06 MED ORDER — ALBUTEROL SULFATE (2.5 MG/3ML) 0.083% IN NEBU
2.5000 mg | INHALATION_SOLUTION | Freq: Once | RESPIRATORY_TRACT | Status: AC
  Administered 2015-06-06: 2.5 mg via RESPIRATORY_TRACT
  Filled 2015-06-06: qty 3

## 2015-06-06 MED ORDER — BENZONATATE 100 MG PO CAPS: 100.0000 mg | ORAL_CAPSULE | Freq: Three times a day (TID) | ORAL | Status: DC | PRN

## 2015-06-06 MED ORDER — IPRATROPIUM-ALBUTEROL 0.5-2.5 (3) MG/3ML IN SOLN
3.0000 mL | Freq: Once | RESPIRATORY_TRACT | Status: AC
  Administered 2015-06-06: 3 mL via RESPIRATORY_TRACT
  Filled 2015-06-06: qty 3

## 2015-06-06 MED ORDER — PREDNISONE 50 MG PO TABS
60.0000 mg | ORAL_TABLET | Freq: Once | ORAL | Status: DC
  Filled 2015-06-06: qty 1

## 2015-06-06 MED ORDER — PREDNISONE 20 MG PO TABS: 40.0000 mg | ORAL_TABLET | Freq: Every day | ORAL | Status: DC

## 2015-06-06 MED ORDER — ALBUTEROL SULFATE HFA 108 (90 BASE) MCG/ACT IN AERS: 2.0000 | INHALATION_SPRAY | RESPIRATORY_TRACT | Status: DC | PRN

## 2015-06-06 NOTE — ED Notes (Signed)
Pt c/o SOB and cough x 5 days. Hx of COPD. Pt coughing continuously stating, "I can't breathe". O2 sats 98% on RA. Pt reports hx of panic attacks and states, "I think I had a panic attack also during all this". Pt has inhaler she uses at home once daily and reports she used it 5 times today due to difficulty breathing. Pt denies productive cough, fever.

## 2015-06-06 NOTE — Discharge Instructions (Signed)
°Emergency Department Resource Guide °1) Find a Doctor and Pay Out of Pocket °Although you won't have to find out who is covered by your insurance plan, it is a good idea to ask around and get recommendations. You will then need to call the office and see if the doctor you have chosen will accept you as a new patient and what types of options they offer for patients who are self-pay. Some doctors offer discounts or will set up payment plans for their patients who do not have insurance, but you will need to ask so you aren't surprised when you get to your appointment. ° °2) Contact Your Local Health Department °Not all health departments have doctors that can see patients for sick visits, but many do, so it is worth a call to see if yours does. If you don't know where your local health department is, you can check in your phone book. The CDC also has a tool to help you locate your state's health department, and many state websites also have listings of all of their local health departments. ° °3) Find a Walk-in Clinic °If your illness is not likely to be very severe or complicated, you may want to try a walk in clinic. These are popping up all over the country in pharmacies, drugstores, and shopping centers. They're usually staffed by nurse practitioners or physician assistants that have been trained to treat common illnesses and complaints. They're usually fairly quick and inexpensive. However, if you have serious medical issues or chronic medical problems, these are probably not your best option. ° °No Primary Care Doctor: °- Call [facility] at  832-8000 - they can help you locate a primary care doctor that  accepts your insurance, provides certain services, etc. °- Physician Referral Service- 1-800-533-3463 ° °Chronic Pain Problems: °Organization         Address  Phone   Notes  °[facility]  (336) 297-2271 Patients need to be referred by their primary care doctor.  ° °Medication  Assistance: °Organization         Address  Phone   Notes  °[place] [facility] [address] °[place], [place] [address] (336) 641-8030 --Must be a resident of [place] °-- Must have NO insurance coverage whatsoever (no Medicaid/ Medicare, etc.) °-- The pt. MUST have a primary care doctor that directs their care regularly and follows them in the community °  °[facility]  (866) 331-1348   °[employer]  (888) 892-1162   ° °Agencies that provide inexpensive medical care: °Organization         Address  Phone   Notes  °[facility]  (336) 832-8035   °[facility]    (336) 832-7272   °[facility] [address] °[place], [place] [address] (336) 832-4777   °[facility] [address], °[place] (336) 271-4999   °[facility]    (336) 373-0678   °[facility]    (336) 272-1050   °[facility] ° [address], [place] Phone:  (336) 832-4444, Fax:  (336) 832-4440 Hours of Operation:  9 am - 6 pm, M-F.  Also accepts Medicaid/Medicare and self-pay.  °[facility] ° [address], [place] Phone: (336) 832-3150, Fax: (336) 832-3151. Hours of Operation:  8:30 am - 5:30 pm, M-F.  Also accepts Medicaid and self-pay.  °[facility] [place] [address], [place] Phone: (336) 878-6027   °[facility] [address], [place], [place] (336)723-1848, Ext. 123 Mondays & Thursdays: 7-9 AM.  First 15 patients are seen on a first come, first serve basis. °  ° °Medicaid-accepting [place] Providers: ° °Organization         Address  Phone   Notes  °[facility] [address], [place] (336) 641-2100 Also accepts self-pay patients.  °[facility] [address], [place] ° (336) 856-9996   °[facility] [address], [place]  (336) 288-8857   °[facility] [address] [place] [address], [place] (336) 299-7000   °Veita Bland [address], [place]  ° (336) 373-1557 Only accepts [place] Access Medicaid patients after they have their name applied to their card.  ° °Self-Pay (no insurance) in [place]: ° °Organization         Address  Phone   Notes  °Sickle Cell Patients, [facility] [address], [place] (336) 832-1970   °[facility] [address], [place] (336) 832-4400   °[facility] ° [address], [place] (336) 992-4800   °[facility]/Dr. Osei-Bonsu ° [address] [place] [address], [place] or [address], [place] (336) 841-8500 Phone number for both [place] and [place] locations is the same.  °[facility] [address], [place] (336) 299-0000   °[facility] [address] [place] [address], [place] or [address] (336) 852-7530 °(336) 878-2260   °[facility] [address], [place] (336) 350-1642, phone; (336) 294-5005, fax Sees patients 1st and 3rd Saturday of every month.  Must not qualify for public or private insurance (i.e. Medicaid, Medicare, [place] Health Choice, Veterans' Benefits) • Household income should be no more than 200% of the poverty level •The clinic cannot treat you if you are pregnant or think you are pregnant • Sexually transmitted diseases are not treated at the clinic.  ° ° °Dental Care: °Organization         Address  Phone  Notes  °[place] [facility] [address], [place] (336) 641-6152 Accepts children up to age 21 who are enrolled in Medicaid or [place] Health Choice; pregnant women with a Medicaid card; and children who have applied for Medicaid or [place] Health Choice, but were declined, whose parents can pay a reduced fee at time of service.  °[place]  [facility] [place]  [address], [place] (336) 641-7733 Accepts children up to age 21 who are enrolled in Medicaid or [place] Health Choice; pregnant women with a Medicaid card; and children who have applied for Medicaid or [place] Health Choice, but were declined, whose parents can pay a reduced fee at time of service.  °[facility] ° [address], [place] (336) 641-4533 Patients are seen by appointment only. Walk-ins are not accepted. [facility] will see patients 18 years of age and older. °Monday - Tuesday (8am-5pm) °Most Wednesdays (8:30-5pm) °$30 per visit, cash only  °[facility] ° [address], [place] (336) 641-4533 Patients are seen by appointment only. Walk-ins are not accepted. [facility] will see patients 18 years of age and older. °One   Wednesday Evening (Monthly: Volunteer Based).  $30 per visit, cash only  °[facility]  (919) 537-3737 for adults; Children under age 4, call Graduate Pediatric Dentistry at (919) [phone]. Children aged 4-14, please call (919) [phone] to request a pediatric application. ° Dental services are provided in all areas of dental care including fillings, crowns and bridges, complete and partial dentures, implants, gum treatment, root canals, and extractions. Preventive care is also provided. Treatment is provided to both adults and children. °Patients are selected via a lottery and there is often a waiting list. °  °[facility] [address], °[place] ° (336) 763-8833 [email or web address] °  °[facility] [address], [place], [place] (336)723-1848, Ext. 123 Second and Fourth Thursday of each month, opens at 6:30 AM; Clinic ends at 9 AM.  Patients are seen on a first-come first-served basis, and a limited number are seen during each clinic.  ° °[facility] ° [address], [place], [place] (336) 723-7904    Eligibility Requirements °You must have lived in [place], [place], or [place] counties for at least the last three months. °  You cannot be eligible for state or federal sponsored healthcare insurance, including Veterans Administration, Medicaid, or Medicare. °  You generally cannot be eligible for healthcare insurance through your employer.  °  How to apply: °Eligibility screenings are held every Tuesday and Wednesday afternoon from 1:00 pm until 4:00 pm. You do not need an appointment for the interview!  °[facility] [address], [place], [place] 336-631-2330   °[facility]  336-342-8273   °[facility]  336-703-3100   °[facility]  336-570-6415   ° °Behavioral Health Resources in the Community: °Intensive Outpatient Programs °Organization         Address  Phone  Notes  °[facility] [address], [place], [place] 336-878-6098   °[facility] [address], [place], [place] 336-832-9800   °[facility] [address], [place], [place] ° 336-882-2125   °[facility] [address],  °[place], [place] 1-800-853-5163 or 336-641-4981   °Substance Abuse Resources °Organization         Address  Phone  Notes  °[facility]  336-882-2125   °[facility]  336-784-9470   °[facility]  336-285-9073   °[facility]  336-845-3988   °Residential & Outpatient Substance Abuse Program  1-800-659-3381   °Psychological Services °Organization         Address  Phone  Notes  °[facility]  336- 832-9600   °[facility]  336- 378-7881   °[facility] [address], [place] 1-800-853-5163 or 336-641-4981   ° °Mobile Crisis Teams °Organization         Address  Phone  Notes  °[facility], Mobile Crisis Care Unit  1-877-626-1772   °Assertive °[facility] ° [address]  [place], [place] 336-834-9664   °Sharon DeEsch [address] ° [place] 336-554-5454   ° °Self-Help/Support Groups °Organization         Address  Phone             Notes  °[facility]  - variety of support groups  336- 373-1402 Call for more information  °Narcotics Anonymous (NA), [facility] [address], °[place]  2 meetings at this location  ° °  Residential Treatment Programs °Organization         Address  Phone  Notes  °[facility] [address],    °[place]  1-866-801-8205   °[facility] ° [address], [place], [place] 704-293-8524   °[facility] [address], [place] 336-845-3988 Admissions: 8am-3pm M-F  °[facility] [address]    °[place], [place] 336-841-1104   °[facility] [address], [place], [place] 336-379-7146   °[facility] [address]  °[place], [place] 336-285-9073   °[facility] - Intensive Outpatient [address], [place], [place] 336-852-3033   °[facility] [address]  °[place], [place] 1-877-615-2722 or 336-784-9470   °[facility] [address] [place], [place] 336-227-7417 Accepts Medicaid  °[facility] [address]  °[place] 1-800-659-3381 Substance Abuse/Addiction Treatment  ° °[place] Behavioral Health Resources °Organization         Address  Phone  Notes  °[facility]  (888) 581-9988   °Julie Brannon, PhD [address] [place], [place]   (336) 349-5553 or (336) 951-0000   °[facility]   [address] °[place], [place] (336) 349-4454   °[facility] [address], [place], [place] (336) 342-8316 Insurance/Medicaid/sponsorship through [facility]  °[facility] [address]                                    [place], [place] (336) 342-8316 Therapy/tele-psych/case    °[facility] [address]  ° [place], [place] (336) 349-2233    °Dr. Arfeen  (336) 349-4544   °[facility] [place]  [employer] [place] [facility] 1) [address], [place] °2) [address], [place] °3)  [address], [place] (336) 349-3220 °(336) 342-7768 ° °(336) 342-8140   °[place] [facility] (336) 342-1394 or (336) 342-3537 (After Hours)    ° ° °Take the prescriptions as directed.  Use your albuterol inhaler (2 to 4 puffs) every 4 hours for the next 7 days, then as needed for cough, wheezing, or shortness of breath.  Call your regular medical doctor tomorrow morning to schedule a follow up appointment within the next 3 days.  Return to the Emergency Department immediately sooner if worsening.  ° °

## 2015-06-06 NOTE — ED Notes (Signed)
MD at bedside. 

## 2015-06-06 NOTE — ED Notes (Addendum)
Some discharge instructions given to pt, pt upset and saying she was leaving. Pt refused vital signs and to sign discharge instructions. Pt ambulatory leaving ED.

## 2015-06-06 NOTE — ED Notes (Signed)
Pt left ED without IV being removed. Attempted to look for pt, but she was not to be found. Police notified per Dr. Jeneen Rinks.

## 2015-06-06 NOTE — ED Notes (Signed)
Sob and cough times 5 days.

## 2015-06-06 NOTE — ED Provider Notes (Signed)
CSN: [id number]     Arrival date & time 06/06/15  1414 History   First MD Initiated Contact with Patient 06/06/15 1427     Chief Complaint  Patient presents with  . Shortness of Breath      HPI Pt was seen at 1440.  Per pt, c/o gradual onset and worsening of persistent cough, wheezing and SOB for the past 5 days.  Has been associated with sore throat. States she "is having panic attacks because when I get coughing I can't stop and then I can't breathe." Pt states she "had a rx for zpack" so she started to take it yesterday. Pt states she does not have an albuterol inhaler. Denies CP/palpitations, no back pain, no abd pain, no N/V/D, no fevers, no rash.     Past Medical History  Diagnosis Date  . Bipolar 1 disorder (HCC) Degenerative disk disease  . Arthritis     hands and knees  . Panic attacks   . Chronic back pain   . Incontinence   . Chronic neck pain   . Multiple personality disorder   . Depression   . Sleep apnea     uses a cpap-with oxygen  . Complication of anesthesia     high anxiety-does not want to be alone  . COPD (chronic obstructive pulmonary disease) ([place])   . Hot flashes 12/15/2013  . Peptic ulcer   . Rosacea   . Shingles   . IBS (irritable bowel syndrome)   . Current use of estrogen therapy 01/12/2014  . Kidney stone   . Hypertension   . Headache    Past Surgical History  Procedure Laterality Date  . Abdominal hysterectomy    . Tonsillectomy    . Foot arthrodesis  2000    both feet  . Bunionectomy with hammertoe reconstruction Right 12/10/2012    Procedure: RIGHT FIRST METATARSAL CHEVRON BUNION CORRECTION,  2 AND 3 HAMMERTOE CORRECTION , RIGHT 3 AND 4 TOE NAIL EXCISION ;  Surgeon: Wylene Simmer, MD;  Location: [place];  Service: Orthopedics;  Laterality: Right;  . Rectal surgery     Family History  Problem Relation Age of Onset  . Diabetes Mother   . Other Mother     vertigo; chronic eye disease  . COPD Father   . COPD Sister   .  Alcohol abuse Brother   . Diabetes Maternal Grandmother   . Diabetes Maternal Grandfather   . Diabetes Sister   . Other Sister     hearing problems  . Hyperlipidemia Sister   . Alcohol abuse Brother   . COPD Brother   . Alcohol abuse Brother   . Alcohol abuse Brother   . Other Brother     aneursym   Social History  Substance Use Topics  . Smoking status: Former Smoker -- 0.00 packs/day for 35 years    Types: Cigarettes    Quit date: 10/19/2012  . Smokeless tobacco: Never Used  . Alcohol Use: No   OB History    Gravida Para Term Preterm AB TAB SAB Ectopic Multiple Living   5 5        5      Review of Systems ROS: Statement: All systems negative except as marked or noted in the HPI; Constitutional: Negative for fever and chills. ; ; Eyes: Negative for eye pain, redness and discharge. ; ; ENMT: Negative for ear pain, hoarseness, nasal congestion, sinus pressure and +sore throat. ; ; Cardiovascular: Negative for chest pain,  palpitations, diaphoresis, and peripheral edema. ; ; Respiratory: +cough, wheezing, SOB. Negative for wheezing and stridor. ; ; Gastrointestinal: Negative for nausea, vomiting, diarrhea, abdominal pain, blood in stool, hematemesis, jaundice and rectal bleeding. . ; ; Genitourinary: Negative for dysuria, flank pain and hematuria. ; ; Musculoskeletal: Negative for back pain and neck pain. Negative for swelling and trauma.; ; Skin: Negative for pruritus, rash, abrasions, blisters, bruising and skin lesion.; ; Neuro: Negative for headache, lightheadedness and neck stiffness. Negative for weakness, altered level of consciousness , altered mental status, extremity weakness, paresthesias, involuntary movement, seizure and syncope.; Psych:  +anxiety, panic attack. No SI, no SA, no HI, no hallucinations.     Allergies  Chantix; Codeine; Divalproex sodium; Hydrocodone; Paxil; and Sulfur  Home Medications   Prior to Admission medications   Medication Sig Start Date End Date  Taking? Authorizing Provider  azithromycin (ZITHROMAX) 250 MG tablet Take 250-500 mg by mouth See admin instructions. TAKE TWO TABS ON DAY 1, THEN TAKE ONE TABLET ON DAYS 2 THROUGH 5. FILLED ON 06/01/2015   Yes Historical Provider, MD  busPIRone (BUSPAR) 15 MG tablet Take 30 mg by mouth at bedtime.   Yes Historical Provider, MD  cloNIDine (CATAPRES) 0.1 MG tablet Take 0.1 mg by mouth 2 (two) times daily. 12P&4P   Yes Historical Provider, MD  diazepam (VALIUM) 5 MG tablet Take 1 tablet (5 mg total) by mouth every 8 (eight) hours as needed (vertigo). Patient taking differently: Take 5 mg by mouth 3 (three) times daily.  06/14/14  Yes Tanna Furry, MD  dicyclomine (BENTYL) 10 MG capsule Take 10 mg by mouth 4 (four) times daily -  before meals and at bedtime.   Yes Historical Provider, MD  esomeprazole (NEXIUM) 40 MG capsule Take 40 mg by mouth 2 (two) times daily before a meal.    Yes Historical Provider, MD  estradiol (VIVELLE-DOT) 0.0375 MG/24HR Place 1 patch onto the skin 2 (two) times a week.   Yes Historical Provider, MD  hydrOXYzine (VISTARIL) 50 MG capsule Take 50 mg by mouth 3 (three) times daily.   Yes Historical Provider, MD  ketoconazole (NIZORAL) 2 % shampoo Apply 1 application topically 2 (two) times a week.   Yes Historical Provider, MD  mirabegron ER (MYRBETRIQ) 25 MG TB24 tablet Take 25 mg by mouth daily.   Yes Historical Provider, MD  oxcarbazepine (TRILEPTAL) 600 MG tablet Take 600 mg by mouth 2 (two) times daily.    Yes Historical Provider, MD  oxyCODONE (ROXICODONE) 15 MG immediate release tablet Take 15 mg by mouth 4 (four) times daily.    Yes Historical Provider, MD  pravastatin (PRAVACHOL) 40 MG tablet Take 40 mg by mouth daily. Reported on 06/06/2015   Yes Historical Provider, MD  traZODone (DESYREL) 100 MG tablet Take 200 mg by mouth at bedtime as needed for sleep.   Yes Historical Provider, MD  Umeclidinium-Vilanterol (ANORO ELLIPTA) 62.5-25 MCG/INH AEPB Inhale 1 puff into the  lungs daily.   Yes Historical Provider, MD  albuterol (PROVENTIL HFA;VENTOLIN HFA) 108 (90 BASE) MCG/ACT inhaler Inhale 1 puff into the lungs every 6 (six) hours as needed. For shortness of breath. 11/25/12   Encarnacion Slates, NP  prochlorperazine (COMPAZINE) 10 MG tablet Take 1 tablet (10 mg total) by mouth every 6 (six) hours as needed for nausea or vomiting. Patient not taking: Reported on 10/07/2014 06/15/14   Daleen Bo, MD  prochlorperazine (COMPAZINE) 25 MG suppository Place 1 suppository (25 mg total) rectally every 12 (twelve)  hours as needed for nausea or vomiting. Patient not taking: Reported on 10/07/2014 06/15/14   Daleen Bo, MD   BP 140/90 mmHg  Pulse 94  Temp(Src) 98.9 F (37.2 C) (Oral)  Resp 30  Ht 5\' 10"  (1.778 m)  Wt 165 lb (74.844 kg)  BMI 23.68 kg/m2  SpO2 96% Physical Exam  1445: Physical examination:  Nursing notes reviewed; Vital signs and O2 SAT reviewed;  Constitutional: Well developed, Well nourished, Well hydrated, Tearful.; Head:  Normocephalic, atraumatic; Eyes: EOMI, PERRL, No scleral icterus; ENMT: Mouth and pharynx normal, Mucous membranes moist. TM's clear bilat. +edemetous nasal turbinates bilat with clear rhinorrhea. Mouth and pharynx without lesions. No tonsillar exudates. No intra-oral edema. No submandibular or sublingual edema. No hoarse voice, no drooling, no stridor. No pain with manipulation of larynx. No trismus. ; Neck: Supple, Full range of motion, No lymphadenopathy; Cardiovascular: Regular rate and rhythm, No gallop; Respiratory: Breath sounds coarse & equal bilaterally, No wheezes.  Speaking full sentences with ease, Normal respiratory effort/excursion; Chest: Nontender, Movement normal; Abdomen: Soft, Nontender, Nondistended, Normal bowel sounds; Genitourinary: No CVA tenderness; Extremities: Pulses normal, No tenderness, No edema, No calf edema or asymmetry.; Neuro: AA&Ox3, Major CN grossly intact.  Speech clear. No gross focal motor or sensory  deficits in extremities.; Skin: Color normal, Warm, Dry.; Psych:  Anxious.  ED Course  Procedures (including critical care time)  Labs Review  Imaging Review  I have personally reviewed and evaluated these images and lab results as part of my medical decision-making.   EKG Interpretation   Date/Time:  Tuesday June 06 2015 15:33:20 EST Ventricular Rate:  84 PR Interval:  150 QRS Duration: 91 QT Interval:  370 QTC Calculation: 437 R Axis:   62 Text Interpretation:  Sinus rhythm Abnormal R-wave progression, early  transition When compared with ECG of 06/14/2014 No significant change was  found Confirmed by [facility]  MD, Nunzio Cory [phone]) on 06/06/2015 4:22:21 PM      MDM  MDM Reviewed: previous chart, nursing note and vitals Reviewed previous: labs and ECG Interpretation: labs, ECG and x-ray     Results for orders placed or performed during the hospital encounter of 06/06/15  Rapid strep screen  Result Value Ref Range   Streptococcus, Group A Screen (Direct) NEGATIVE NEGATIVE  Basic metabolic panel  Result Value Ref Range   Sodium 140 135 - 145 mmol/L   Potassium 3.8 3.5 - 5.1 mmol/L   Chloride 105 101 - 111 mmol/L   CO2 24 22 - 32 mmol/L   Glucose, Bld 85 65 - 99 mg/dL   BUN 12 6 - 20 mg/dL   Creatinine, Ser 0.66 0.44 - 1.00 mg/dL   Calcium 9.6 8.9 - 10.3 mg/dL   GFR calc non Af Amer >60 >60 mL/min   GFR calc Af Amer >60 >60 mL/min   Anion gap 11 5 - 15  Troponin I  Result Value Ref Range   Troponin I <0.03 <0.031 ng/mL  Lactic acid, plasma  Result Value Ref Range   Lactic Acid, Venous 1.1 0.5 - 2.0 mmol/L  CBC with Differential  Result Value Ref Range   WBC 7.8 4.0 - 10.5 K/uL   RBC 5.20 (H) 3.87 - 5.11 MIL/uL   Hemoglobin 16.5 (H) 12.0 - 15.0 g/dL   HCT 49.1 (H) 36.0 - 46.0 %   MCV 94.4 78.0 - 100.0 fL   MCH 31.7 26.0 - 34.0 pg   MCHC 33.6 30.0 - 36.0 g/dL   RDW 13.2 11.5 -  15.5 %   Platelets 250 150 - 400 K/uL   Neutrophils Relative % 56 %    Neutro Abs 4.5 1.7 - 7.7 K/uL   Lymphocytes Relative 33 %   Lymphs Abs 2.5 0.7 - 4.0 K/uL   Monocytes Relative 8 %   Monocytes Absolute 0.6 0.1 - 1.0 K/uL   Eosinophils Relative 2 %   Eosinophils Absolute 0.1 0.0 - 0.7 K/uL   Basophils Relative 1 %   Basophils Absolute 0.1 0.0 - 0.1 K/uL  CBG monitoring, ED  Result Value Ref Range   Glucose-Capillary 91 65 - 99 mg/dL   Dg Chest 2 View 06/06/2015  CLINICAL DATA:  59 year old female with shortness of breath and cough for the past 5 days. History of panic attacks and COPD. EXAM: CHEST  2 VIEW COMPARISON:  Prior chest x-ray 12/17/2012 FINDINGS: The lungs are clear and negative for focal airspace consolidation, pulmonary edema or suspicious pulmonary nodule. Stable chronic bronchitic change and mild interstitial prominence. No pleural effusion or pneumothorax. Cardiac and mediastinal contours are within normal limits. No acute fracture or lytic or blastic osseous lesions. The visualized upper abdominal bowel gas pattern is unremarkable. IMPRESSION: No active cardiopulmonary disease. Electronically Signed   By: Jacqulynn Cadet M.D.   On: 06/06/2015 15:27    1825:  Pt states she had a panic attack on arrival to the ED.  While in the ED, pt took her usual home/psych meds that she endorses she "didn't take earlier" due to her coughing.  Pt given short neb treatment and PO steroid.  Workup reassuring. Tol PO well without N/V. Pt ambulated with Sats 94-96% R/A, resps easy, NAD. Pt states she feels better and wants to go home now. Dx and testing d/w pt and family.  Questions answered.  Verb understanding, agreeable to d/c home with outpt f/u.    Francine Graven, DO 06/08/15 2034

## 2015-06-06 NOTE — ED Notes (Signed)
Pt ambulated around nurses station with no difficulty. O2-94-96%

## 2015-06-08 LAB — CULTURE, GROUP A STREP: STREP A CULTURE: NEGATIVE

## 2015-06-29 ENCOUNTER — Emergency Department (HOSPITAL_COMMUNITY): Payer: Medicaid Other

## 2015-06-29 ENCOUNTER — Emergency Department (HOSPITAL_COMMUNITY)
Admission: EM | Admit: 2015-06-29 | Discharge: 2015-06-29 | Disposition: A | Discharge status: Home / Self Care | Payer: Medicaid Other | Attending: Emergency Medicine | Admitting: Emergency Medicine

## 2015-06-29 ENCOUNTER — Encounter (HOSPITAL_COMMUNITY): Payer: Self-pay | Admitting: Emergency Medicine

## 2015-06-29 DIAGNOSIS — Z87442 Personal history of urinary calculi: Secondary | ICD-10-CM | POA: Diagnosis not present

## 2015-06-29 DIAGNOSIS — R531 Weakness: Secondary | ICD-10-CM | POA: Diagnosis not present

## 2015-06-29 DIAGNOSIS — Z8742 Personal history of other diseases of the female genital tract: Secondary | ICD-10-CM | POA: Insufficient documentation

## 2015-06-29 DIAGNOSIS — Z8619 Personal history of other infectious and parasitic diseases: Secondary | ICD-10-CM | POA: Insufficient documentation

## 2015-06-29 DIAGNOSIS — M1990 Unspecified osteoarthritis, unspecified site: Secondary | ICD-10-CM | POA: Diagnosis not present

## 2015-06-29 DIAGNOSIS — F410 Panic disorder [episodic paroxysmal anxiety] without agoraphobia: Secondary | ICD-10-CM | POA: Insufficient documentation

## 2015-06-29 DIAGNOSIS — R5383 Other fatigue: Secondary | ICD-10-CM | POA: Insufficient documentation

## 2015-06-29 DIAGNOSIS — H538 Other visual disturbances: Secondary | ICD-10-CM | POA: Insufficient documentation

## 2015-06-29 DIAGNOSIS — Z872 Personal history of diseases of the skin and subcutaneous tissue: Secondary | ICD-10-CM | POA: Diagnosis not present

## 2015-06-29 DIAGNOSIS — I10 Essential (primary) hypertension: Secondary | ICD-10-CM | POA: Diagnosis not present

## 2015-06-29 DIAGNOSIS — R05 Cough: Secondary | ICD-10-CM | POA: Insufficient documentation

## 2015-06-29 DIAGNOSIS — J449 Chronic obstructive pulmonary disease, unspecified: Secondary | ICD-10-CM | POA: Insufficient documentation

## 2015-06-29 DIAGNOSIS — Z8711 Personal history of peptic ulcer disease: Secondary | ICD-10-CM | POA: Insufficient documentation

## 2015-06-29 DIAGNOSIS — Z9981 Dependence on supplemental oxygen: Secondary | ICD-10-CM | POA: Insufficient documentation

## 2015-06-29 DIAGNOSIS — R059 Cough, unspecified: Secondary | ICD-10-CM

## 2015-06-29 DIAGNOSIS — Z79899 Other long term (current) drug therapy: Secondary | ICD-10-CM | POA: Diagnosis not present

## 2015-06-29 DIAGNOSIS — J029 Acute pharyngitis, unspecified: Secondary | ICD-10-CM | POA: Diagnosis not present

## 2015-06-29 DIAGNOSIS — F319 Bipolar disorder, unspecified: Secondary | ICD-10-CM | POA: Diagnosis not present

## 2015-06-29 DIAGNOSIS — G8929 Other chronic pain: Secondary | ICD-10-CM | POA: Insufficient documentation

## 2015-06-29 DIAGNOSIS — Z7952 Long term (current) use of systemic steroids: Secondary | ICD-10-CM | POA: Insufficient documentation

## 2015-06-29 DIAGNOSIS — G4733 Obstructive sleep apnea (adult) (pediatric): Secondary | ICD-10-CM | POA: Diagnosis not present

## 2015-06-29 DIAGNOSIS — K589 Irritable bowel syndrome without diarrhea: Secondary | ICD-10-CM | POA: Diagnosis not present

## 2015-06-29 LAB — BASIC METABOLIC PANEL
ANION GAP: 7 (ref 5–15)
BUN: 10 mg/dL (ref 6–20)
CALCIUM: 9.1 mg/dL (ref 8.9–10.3)
CO2: 28 mmol/L (ref 22–32)
Chloride: 104 mmol/L (ref 101–111)
Creatinine, Ser: 0.61 mg/dL (ref 0.44–1.00)
Glucose, Bld: 142 mg/dL — ABNORMAL HIGH (ref 65–99)
POTASSIUM: 4.4 mmol/L (ref 3.5–5.1)
Sodium: 139 mmol/L (ref 135–145)

## 2015-06-29 LAB — CBC WITH DIFFERENTIAL/PLATELET
BASOS ABS: 0 10*3/uL (ref 0.0–0.1)
BASOS PCT: 1 %
Eosinophils Absolute: 0.1 10*3/uL (ref 0.0–0.7)
Eosinophils Relative: 2 %
HEMATOCRIT: 42.8 % (ref 36.0–46.0)
Hemoglobin: 14.3 g/dL (ref 12.0–15.0)
LYMPHS PCT: 27 %
Lymphs Abs: 1.9 10*3/uL (ref 0.7–4.0)
MCH: 31.4 pg (ref 26.0–34.0)
MCHC: 33.4 g/dL (ref 30.0–36.0)
MCV: 93.9 fL (ref 78.0–100.0)
MONO ABS: 0.6 10*3/uL (ref 0.1–1.0)
Monocytes Relative: 8 %
Neutro Abs: 4.5 10*3/uL (ref 1.7–7.7)
Neutrophils Relative %: 62 %
Platelets: 228 10*3/uL (ref 150–400)
RBC: 4.56 MIL/uL (ref 3.87–5.11)
RDW: 13.4 % (ref 11.5–15.5)
WBC: 7.1 10*3/uL (ref 4.0–10.5)

## 2015-06-29 LAB — URINALYSIS, ROUTINE W REFLEX MICROSCOPIC
Bilirubin Urine: NEGATIVE
Glucose, UA: NEGATIVE mg/dL
Hgb urine dipstick: NEGATIVE
KETONES UR: NEGATIVE mg/dL
LEUKOCYTES UA: NEGATIVE
NITRITE: NEGATIVE
PROTEIN: NEGATIVE mg/dL
Specific Gravity, Urine: 1.01 (ref 1.005–1.030)
pH: 7.5 (ref 5.0–8.0)

## 2015-06-29 MED ORDER — BENZONATATE 100 MG PO CAPS
200.0000 mg | ORAL_CAPSULE | Freq: Once | ORAL | Status: AC
  Administered 2015-06-29: 200 mg via ORAL
  Filled 2015-06-29: qty 2

## 2015-06-29 MED ORDER — SODIUM CHLORIDE 0.9 % IV BOLUS (SEPSIS)
1000.0000 mL | Freq: Once | INTRAVENOUS | Status: AC
  Administered 2015-06-29: 1000 mL via INTRAVENOUS

## 2015-06-29 MED ORDER — BENZONATATE 100 MG PO CAPS: 200.0000 mg | ORAL_CAPSULE | Freq: Three times a day (TID) | ORAL | Status: DC | PRN

## 2015-06-29 NOTE — ED Notes (Signed)
Pt states that she is very fatigued and has not been feeling well since the last time she was evaluated in the ED.  States that her vision is blurry, she is tired, and continues to cough and have a sore throat.

## 2015-06-29 NOTE — ED Provider Notes (Signed)
CSN: [id number]     Arrival date & time 06/29/15  1106 History   First MD Initiated Contact with Patient 06/29/15 1114     Chief Complaint  Patient presents with  . Fatigue  . Cough     (Consider location/radiation/quality/duration/timing/severity/associated sxs/prior Treatment) The history is provided by the patient.   Cynthia Deleon is a 60 y.o. female with past medical history as indicated below, most significant today for COPD (continues to smoke although trying to cut back) and panic attacks presenting with multiple complaints including fatigue which is worsened today, but chronic non productive cough, sore throat and intermittent laryngitis with sore throat since she was last seen here 12/27 for an acute COPD flare.  Today, she woke and noticed she had blurred vision, put her contact in (wears for reading only) which worsened her blurriness.  She became scared and presents tearful but denies current panic.  She has had no fevers or chills, denies chest pain except when coughing, no nausea, vomiting, sob, peripheral edema or abdominal pain.  She also denies headache, dizziness or focal weakness.  She reports having an additional round of abx when seen by her pcp several weeks ago and has a f/u appt in 5 days given the persistent cough.     Past Medical History  Diagnosis Date  . Bipolar 1 disorder (HCC) Degenerative disk disease  . Arthritis     hands and knees  . Panic attacks   . Chronic back pain   . Incontinence   . Chronic neck pain   . Multiple personality disorder   . Depression   . Sleep apnea     uses a cpap-with oxygen  . Complication of anesthesia     high anxiety-does not want to be alone  . COPD (chronic obstructive pulmonary disease) ([place])   . Hot flashes 12/15/2013  . Peptic ulcer   . Rosacea   . Shingles   . IBS (irritable bowel syndrome)   . Current use of estrogen therapy 01/12/2014  . Kidney stone   . Hypertension   . Headache    Past Surgical  History  Procedure Laterality Date  . Abdominal hysterectomy    . Tonsillectomy    . Foot arthrodesis  2000    both feet  . Bunionectomy with hammertoe reconstruction Right 12/10/2012    Procedure: RIGHT FIRST METATARSAL CHEVRON BUNION CORRECTION,  2 AND 3 HAMMERTOE CORRECTION , RIGHT 3 AND 4 TOE NAIL EXCISION ;  Surgeon: Wylene Simmer, MD;  Location: [place];  Service: Orthopedics;  Laterality: Right;  . Rectal surgery     Family History  Problem Relation Age of Onset  . Diabetes Mother   . Other Mother     vertigo; chronic eye disease  . COPD Father   . COPD Sister   . Alcohol abuse Brother   . Diabetes Maternal Grandmother   . Diabetes Maternal Grandfather   . Diabetes Sister   . Other Sister     hearing problems  . Hyperlipidemia Sister   . Alcohol abuse Brother   . COPD Brother   . Alcohol abuse Brother   . Alcohol abuse Brother   . Other Brother     aneursym   Social History  Substance Use Topics  . Smoking status: Former Smoker -- 0.00 packs/day for 35 years    Types: Cigarettes    Quit date: 10/19/2012  . Smokeless tobacco: Never Used  . Alcohol Use: No   OB  History    Gravida Para Term Preterm AB TAB SAB Ectopic Multiple Living   5 5        5      Review of Systems  Constitutional: Positive for fatigue. Negative for fever.  HENT: Positive for sore throat and voice change. Negative for congestion.   Eyes: Positive for visual disturbance.  Respiratory: Positive for cough. Negative for chest tightness, shortness of breath and wheezing.   Cardiovascular: Negative for chest pain.  Gastrointestinal: Negative for nausea, vomiting and abdominal pain.  Genitourinary: Negative.   Musculoskeletal: Negative for back pain, joint swelling, arthralgias and neck pain.  Skin: Negative.  Negative for rash and wound.  Neurological: Positive for weakness. Negative for dizziness, light-headedness, numbness and headaches.  Psychiatric/Behavioral: Negative.        Allergies  Chantix; Codeine; Divalproex sodium; Hydrocodone; Paxil; and Sulfur  Home Medications   Prior to Admission medications   Medication Sig Start Date End Date Taking? Authorizing Provider  albuterol (PROVENTIL HFA;VENTOLIN HFA) 108 (90 Base) MCG/ACT inhaler Inhale 2 puffs into the lungs every 4 (four) hours as needed for wheezing or shortness of breath. 06/06/15  Yes Francine Graven, DO  cloNIDine (CATAPRES) 0.1 MG tablet Take 0.1 mg by mouth 2 (two) times daily. 12P&4P   Yes Historical Provider, MD  diazepam (VALIUM) 5 MG tablet Take 1 tablet (5 mg total) by mouth every 8 (eight) hours as needed (vertigo). Patient taking differently: Take 5 mg by mouth 3 (three) times daily.  06/14/14  Yes Tanna Furry, MD  dicyclomine (BENTYL) 10 MG capsule Take 10 mg by mouth 4 (four) times daily -  before meals and at bedtime.   Yes Historical Provider, MD  esomeprazole (NEXIUM) 40 MG capsule Take 40 mg by mouth 2 (two) times daily before a meal.    Yes Historical Provider, MD  estrogens, conjugated, (PREMARIN) 0.3 MG tablet Take 0.3 mg by mouth daily. Take daily for 21 days then do not take for 7 days.   Yes Historical Provider, MD  ketoconazole (NIZORAL) 2 % shampoo Apply 1 application topically 2 (two) times a week.   Yes Historical Provider, MD  mirabegron ER (MYRBETRIQ) 25 MG TB24 tablet Take 25 mg by mouth daily.   Yes Historical Provider, MD  oxcarbazepine (TRILEPTAL) 600 MG tablet Take 600 mg by mouth 2 (two) times daily.    Yes Historical Provider, MD  oxyCODONE (ROXICODONE) 15 MG immediate release tablet Take 15 mg by mouth 4 (four) times daily.    Yes Historical Provider, MD  pravastatin (PRAVACHOL) 40 MG tablet Take 40 mg by mouth daily. Reported on 06/06/2015   Yes Historical Provider, MD  predniSONE (DELTASONE) 20 MG tablet Take 2 tablets (40 mg total) by mouth daily. 06/06/15  Yes Francine Graven, DO  traZODone (DESYREL) 100 MG tablet Take 200 mg by mouth at bedtime as needed  for sleep.   Yes Historical Provider, MD  Umeclidinium-Vilanterol (ANORO ELLIPTA) 62.5-25 MCG/INH AEPB Inhale 1 puff into the lungs daily.   Yes Historical Provider, MD  benzonatate (TESSALON) 100 MG capsule Take 2 capsules (200 mg total) by mouth 3 (three) times daily as needed. 06/29/15   Evalee Jefferson, PA-C   BP 122/70 mmHg  Pulse 77  Temp(Src) 97.4 F (36.3 C) (Oral)  Resp 16  Ht 5\' 9"  (1.753 m)  Wt 78.472 kg  BMI 25.54 kg/m2  SpO2 97% Physical Exam  Constitutional: She is oriented to person, place, and time. She appears well-developed and well-nourished.  Tearful  1/2 way through exam. Endorses being scared.  Denies panic.   HENT:  Head: Normocephalic and atraumatic.  Mouth/Throat: Uvula is midline. No oropharyngeal exudate or posterior oropharyngeal erythema.  Buccal mucosa dry.  Eyes: Pupils are equal, round, and reactive to light. Right conjunctiva is injected. Left conjunctiva is injected.  Mild bilateral conjunctival injection. Visual Acuity - R Distance: 20/25 ; L Distance: 20/50  Neck: Normal range of motion. Neck supple.  Cardiovascular: Normal rate, regular rhythm, normal heart sounds and intact distal pulses.   Pulmonary/Chest: Effort normal and breath sounds normal. No respiratory distress. She has no wheezes. She exhibits no tenderness.  Abdominal: Soft. Bowel sounds are normal. She exhibits no distension. There is no tenderness. There is no guarding.  Musculoskeletal: Normal range of motion. She exhibits no edema.  Neurological: She is alert and oriented to person, place, and time. She has normal strength. No cranial nerve deficit or sensory deficit. Coordination normal.  Reflex Scores:      Bicep reflexes are 2+ on the right side and 2+ on the left side. Equal grip strength. No pronator drift  Skin: Skin is warm and dry.  Psychiatric: She has a normal mood and affect.  Nursing note and vitals reviewed.   ED Course  Procedures (including critical care time) Labs  Review Labs Reviewed  BASIC METABOLIC PANEL - Abnormal; Notable for the following:    Glucose, Bld 142 (*)    All other components within normal limits  CBC WITH DIFFERENTIAL/PLATELET  URINALYSIS, ROUTINE W REFLEX MICROSCOPIC (NOT AT [facility])    Imaging Review Dg Chest 2 View  06/29/2015  CLINICAL DATA:  Fatigue.  Blurred vision.  Cough.  Sore throat. EXAM: CHEST  2 VIEW COMPARISON:  06/06/2015 FINDINGS: The lungs appear clear.  Cardiac and mediastinal contours normal. No pleural effusion identified. Mild thoracic spondylosis.  Bony demineralization. IMPRESSION: 1.  No active cardiopulmonary disease is radiographically apparent. 2. Bony demineralization. Electronically Signed   By: Van Clines M.D.   On: 06/29/2015 12:10   I have personally reviewed and evaluated these images and lab results as part of my medical decision-making.   EKG Interpretation   Date/Time:  Thursday June 29 2015 11:18:55 EST Ventricular Rate:  78 PR Interval:  166 QRS Duration: 86 QT Interval:  375 QTC Calculation: 427 R Axis:   51 Text Interpretation:  Sinus rhythm Confirmed by Jeneen Rinks  MD, [place] ([id number]) on  06/29/2015 12:35:10 PM         MDM   Final diagnoses:  Cough  Other fatigue    Pt with acute uri/bronchitis sx, generalized fatigue.  Patients  labs reviewed.  Radiological studies were viewed, interpreted and considered during the medical decision making and disposition process. I agree with radiologists reading.  Results were also discussed with patient.  She was prescribed tessalon for cough, advised increased fluid intake, mucinex (otc).  Discussed f/u with pcp for a recheck in 1 week if sx persist or worsen.     Evalee Jefferson, PA-C 06/29/15 1657  Tanna Furry, MD 07/04/15 [phone]

## 2015-06-29 NOTE — Discharge Instructions (Signed)
Cough, Adult Coughing is a reflex that clears your throat and your airways. Coughing helps to heal and protect your lungs. It is normal to cough occasionally, but a cough that happens with other symptoms or lasts a long time may be a sign of a condition that needs treatment. A cough may last only 2-3 weeks (acute), or it may last longer than 8 weeks (chronic). CAUSES Coughing is commonly caused by:  Breathing in substances that irritate your lungs.  A viral or bacterial respiratory infection.  Allergies.  Asthma.  Postnasal drip.  Smoking.  Acid backing up from the stomach into the esophagus (gastroesophageal reflux).  Certain medicines.  Chronic lung problems, including COPD (or rarely, lung cancer).  Other medical conditions such as heart failure. HOME CARE INSTRUCTIONS  Pay attention to any changes in your symptoms. Take these actions to help with your discomfort:  Take medicines only as told by your health care provider.  If you were prescribed an antibiotic medicine, take it as told by your health care provider. Do not stop taking the antibiotic even if you start to feel better.  Talk with your health care provider before you take a cough suppressant medicine.  Drink enough fluid to keep your urine clear or pale yellow.  If the air is dry, use a cold steam vaporizer or humidifier in your bedroom or your home to help loosen secretions.  Avoid anything that causes you to cough at work or at home.  If your cough is worse at night, try sleeping in a semi-upright position.  Avoid cigarette smoke. If you smoke, quit smoking. If you need help quitting, ask your health care provider.  Avoid caffeine.  Avoid alcohol.  Rest as needed. SEEK MEDICAL CARE IF:   You have new symptoms.  You cough up pus.  Your cough does not get better after 2-3 weeks, or your cough gets worse.  You cannot control your cough with suppressant medicines and you are losing sleep.  You  develop pain that is getting worse or pain that is not controlled with pain medicines.  You have a fever.  You have unexplained weight loss.  You have night sweats. SEEK IMMEDIATE MEDICAL CARE IF:  You cough up blood.  You have difficulty breathing.  Your heartbeat is very fast.   This information is not intended to replace advice given to you by your health care provider. Make sure you discuss any questions you have with your health care provider.   Document Released: 11/23/2010 Document Revised: 02/15/2015 Document Reviewed: 08/03/2014 Elsevier Interactive Patient Education 2016 [employer].  Fatigue Fatigue is feeling tired all of the time, a lack of energy, or a lack of motivation. Occasional or mild fatigue is often a normal response to activity or life in general. However, long-lasting (chronic) or extreme fatigue may indicate an underlying medical condition. HOME CARE INSTRUCTIONS  Watch your fatigue for any changes. The following actions may help to lessen any discomfort you are feeling:  Talk to your health care provider about how much sleep you need each night. Try to get the required amount every night.  Take medicines only as directed by your health care provider.  Eat a healthy and nutritious diet. Ask your health care provider if you need help changing your diet.  Drink enough fluid to keep your urine clear or pale yellow.  Practice ways of relaxing, such as yoga, meditation, massage therapy, or acupuncture.  Exercise regularly.   Change situations that cause you  stress. Try to keep your work and personal routine reasonable.  Do not abuse illegal drugs.  Limit alcohol intake to no more than 1 drink per day for nonpregnant women and 2 drinks per day for men. One drink equals 12 ounces of beer, 5 ounces of wine, or 1 ounces of hard liquor.  Take a multivitamin, if directed by your health care provider. SEEK MEDICAL CARE IF:   Your fatigue does not get  better.  You have a fever.   You have unintentional weight loss or gain.  You have headaches.   You have difficulty:   Falling asleep.  Sleeping throughout the night.  You feel angry, guilty, anxious, or sad.   You are unable to have a bowel movement (constipation).   You skin is dry.   Your legs or another part of your body is swollen.  SEEK IMMEDIATE MEDICAL CARE IF:   You feel confused.   Your vision is blurry.  You feel faint or pass out.   You have a severe headache.   You have severe abdominal, pelvic, or back pain.   You have chest pain, shortness of breath, or an irregular or fast heartbeat.   You are unable to urinate or you urinate less than normal.   You develop abnormal bleeding, such as bleeding from the rectum, vagina, nose, lungs, or nipples.  You vomit blood.   You have thoughts about harming yourself or committing suicide.   You are worried that you might harm someone else.    This information is not intended to replace advice given to you by your health care provider. Make sure you discuss any questions you have with your health care provider.   Document Released: 03/24/2007 Document Revised: 06/17/2014 Document Reviewed: 09/28/2013 Elsevier Interactive Patient Education [employer].

## 2015-06-29 NOTE — ED Notes (Signed)
Patient given po fluid and crackers to eat. Tolerated well.

## 2015-06-29 NOTE — ED Notes (Signed)
PA at bedside.

## 2015-09-06 ENCOUNTER — Other Ambulatory Visit (HOSPITAL_COMMUNITY): Payer: Self-pay | Admitting: Gastroenterology

## 2015-09-06 DIAGNOSIS — R1030 Lower abdominal pain, unspecified: Secondary | ICD-10-CM

## 2015-09-14 ENCOUNTER — Ambulatory Visit (HOSPITAL_COMMUNITY)
Admission: RE | Admit: 2015-09-14 | Discharge: 2015-09-14 | Disposition: A | Discharge status: Home / Self Care | Payer: Medicaid Other | Source: Ambulatory Visit | Attending: Gastroenterology | Admitting: Gastroenterology

## 2015-09-14 DIAGNOSIS — R1030 Lower abdominal pain, unspecified: Secondary | ICD-10-CM | POA: Insufficient documentation

## 2015-09-14 MED ORDER — IOPAMIDOL (ISOVUE-300) INJECTION 61%
100.0000 mL | Freq: Once | INTRAVENOUS | Status: AC | PRN
  Administered 2015-09-14: 100 mL via INTRAVENOUS

## 2015-10-10 ENCOUNTER — Other Ambulatory Visit: Payer: Self-pay | Admitting: Neurology

## 2015-10-10 ENCOUNTER — Ambulatory Visit (INDEPENDENT_AMBULATORY_CARE_PROVIDER_SITE_OTHER): Payer: Medicaid Other | Admitting: Neurology

## 2015-10-10 ENCOUNTER — Encounter: Payer: Self-pay | Admitting: Neurology

## 2015-10-10 ENCOUNTER — Telehealth: Payer: Self-pay | Admitting: Neurology

## 2015-10-10 ENCOUNTER — Other Ambulatory Visit: Payer: Self-pay | Admitting: *Deleted

## 2015-10-10 VITALS — BP 131/78 | HR 83 | Ht 69.0 in | Wt 173.8 lb

## 2015-10-10 DIAGNOSIS — R21 Rash and other nonspecific skin eruption: Secondary | ICD-10-CM | POA: Diagnosis not present

## 2015-10-10 DIAGNOSIS — M79671 Pain in right foot: Secondary | ICD-10-CM | POA: Insufficient documentation

## 2015-10-10 DIAGNOSIS — G629 Polyneuropathy, unspecified: Secondary | ICD-10-CM | POA: Insufficient documentation

## 2015-10-10 DIAGNOSIS — R3915 Urgency of urination: Secondary | ICD-10-CM

## 2015-10-10 DIAGNOSIS — G6289 Other specified polyneuropathies: Secondary | ICD-10-CM

## 2015-10-10 DIAGNOSIS — W57XXXA Bitten or stung by nonvenomous insect and other nonvenomous arthropods, initial encounter: Secondary | ICD-10-CM

## 2015-10-10 NOTE — Telephone Encounter (Signed)
Pt called in while at [facility] having labs drawn. Pt did not mention that she found 5 ticks on her while here for her office visit. They were latched on. The lab wants to know if any other labs should be added due to the ticks. Please call [phone]- [place] with [facility]  Fax [phone] They need NPI # as well.

## 2015-10-10 NOTE — Telephone Encounter (Signed)
Dr. Krista Blue has placed additional lab orders to check for Lyme's Disease.  Spoke to [place] at [facility] - she is aware of the additional testing.  Also called to inform patient.
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
## 2015-10-10 NOTE — Addendum Note (Signed)
Addended by: Marcial Pacas on: 10/10/2015 01:50 PM   Modules accepted: Orders

## 2015-10-11 ENCOUNTER — Telehealth: Payer: Self-pay | Admitting: Neurology

## 2015-10-11 LAB — MICROSCOPIC EXAMINATION: CASTS: NONE SEEN /LPF

## 2015-10-11 LAB — URINALYSIS, ROUTINE W REFLEX MICROSCOPIC
BILIRUBIN UA: NEGATIVE
Glucose, UA: NEGATIVE
Ketones, UA: NEGATIVE
Nitrite, UA: NEGATIVE
PH UA: 7 (ref 5.0–7.5)
Protein, UA: NEGATIVE
RBC UA: NEGATIVE
Specific Gravity, UA: 1.019 (ref 1.005–1.030)
UUROB: 0.2 mg/dL (ref 0.2–1.0)

## 2015-10-11 LAB — B. BURGDORFI ANTIBODIES

## 2015-10-11 MED ORDER — NITROFURANTOIN MONOHYD MACRO 100 MG PO CAPS: 100.0000 mg | ORAL_CAPSULE | Freq: Two times a day (BID) | ORAL | Status: DC

## 2015-10-11 MED ORDER — FLUCONAZOLE 150 MG PO TABS: 150.0000 mg | ORAL_TABLET | Freq: Once | ORAL | Status: DC

## 2015-10-11 NOTE — Telephone Encounter (Signed)
Patient aware to check with her pharmacy.

## 2015-10-11 NOTE — Telephone Encounter (Signed)
I called in diflucan 150mg  once only

## 2015-10-11 NOTE — Telephone Encounter (Signed)
Please let patient know, UA test did show evidence of urinary tract infection, blood test showed normal WBC, no signs of systemic infection,  I have called in nitrofurantoin, macrocrystal-monohydrate, (MACROBID) 100 MG capsule to her pharmacy, 1 capsule twice a day for 5 days

## 2015-10-11 NOTE — Addendum Note (Signed)
Addended by: Marcial Pacas on: 10/11/2015 12:49 PM   Modules accepted: Orders

## 2015-10-11 NOTE — Telephone Encounter (Signed)
Spoke to patient - aware of results and will start the antibiotic.  She is also requesting a prescription for Diflucan - says she gets a yeast infection every time she takes an antibiotic.

## 2015-10-12 LAB — URINE CULTURE

## 2015-10-16 ENCOUNTER — Telehealth: Payer: Self-pay | Admitting: Neurology

## 2015-10-16 LAB — CBC WITH DIFFERENTIAL/PLATELET
Basophils Absolute: 0 10*3/uL (ref 0.0–0.2)
Basos: 0 %
EOS (ABSOLUTE): 0.2 10*3/uL (ref 0.0–0.4)
EOS: 2 %
HEMATOCRIT: 43.1 % (ref 34.0–46.6)
Hemoglobin: 14.6 g/dL (ref 11.1–15.9)
Immature Grans (Abs): 0 10*3/uL (ref 0.0–0.1)
Immature Granulocytes: 0 %
Lymphocytes Absolute: 2.7 10*3/uL (ref 0.7–3.1)
Lymphs: 26 %
MCH: 31.5 pg (ref 26.6–33.0)
MCHC: 33.9 g/dL (ref 31.5–35.7)
MCV: 93 fL (ref 79–97)
MONOS ABS: 0.5 10*3/uL (ref 0.1–0.9)
Monocytes: 5 %
Neutrophils Absolute: 7.1 10*3/uL — ABNORMAL HIGH (ref 1.4–7.0)
Neutrophils: 67 %
PLATELETS: 254 10*3/uL (ref 150–379)
RBC: 4.63 x10E6/uL (ref 3.77–5.28)
RDW: 13.5 % (ref 12.3–15.4)
WBC: 10.6 10*3/uL (ref 3.4–10.8)

## 2015-10-16 LAB — CULTURE, BLOOD (SINGLE)

## 2015-10-16 NOTE — Telephone Encounter (Signed)
Patient is calling and states she is to be scheduled for an MRI and would like to be scheduled at [facility] in [place].  Please call.

## 2015-10-25 ENCOUNTER — Ambulatory Visit (HOSPITAL_COMMUNITY): Payer: Medicaid Other

## 2015-10-31 ENCOUNTER — Telehealth: Payer: Self-pay | Admitting: Neurology

## 2015-10-31 ENCOUNTER — Ambulatory Visit (HOSPITAL_COMMUNITY)
Admission: RE | Admit: 2015-10-31 | Discharge: 2015-10-31 | Disposition: A | Discharge status: Home / Self Care | Payer: Medicaid Other | Source: Ambulatory Visit | Attending: Neurology | Admitting: Neurology

## 2015-10-31 DIAGNOSIS — G6289 Other specified polyneuropathies: Secondary | ICD-10-CM | POA: Insufficient documentation

## 2015-10-31 DIAGNOSIS — R3915 Urgency of urination: Secondary | ICD-10-CM

## 2015-10-31 DIAGNOSIS — R21 Rash and other nonspecific skin eruption: Secondary | ICD-10-CM

## 2015-10-31 DIAGNOSIS — M47892 Other spondylosis, cervical region: Secondary | ICD-10-CM | POA: Diagnosis not present

## 2015-10-31 DIAGNOSIS — M79671 Pain in right foot: Secondary | ICD-10-CM

## 2015-10-31 DIAGNOSIS — M5021 Other cervical disc displacement,  high cervical region: Secondary | ICD-10-CM | POA: Diagnosis not present

## 2015-10-31 NOTE — Telephone Encounter (Signed)
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 2. No other significant spinal stenosis or nerve root encroachment. 3. No acute findings.

## 2015-10-31 NOTE — Telephone Encounter (Signed)
Please call patient MRI of the cervical showed mild degenerative changes, will review detail at her last follow up visit

## 2015-10-31 NOTE — Telephone Encounter (Signed)
I spoke to patient and she is aware of results.  

## 2015-11-02 ENCOUNTER — Telehealth: Payer: Self-pay | Admitting: Neurology

## 2015-11-02 NOTE — Telephone Encounter (Signed)
I reviewed laboratory evaluation from her primary care equal physician dated September 28 2015, vitamin [id number], normal folic acid 8.3, normal TSH 0.9  Please call patient she has not low normal range B12, may benefit over-the-counter B12 supplement 1000 g daily

## 2015-11-02 NOTE — Telephone Encounter (Signed)
LM for patient to call back for results

## 2015-11-02 NOTE — Telephone Encounter (Signed)
Patient is aware of results and voiced understanding.  Patient asks that if on the day of NCV/EMG, would you do a skin biopsy as well (a family member just had this done and she wants to know if she should have it done? If so she want to do on day of NCV/EMG)

## 2015-11-07 ENCOUNTER — Telehealth: Payer: Self-pay

## 2015-11-12 ENCOUNTER — Encounter (HOSPITAL_COMMUNITY): Payer: Self-pay

## 2015-11-12 ENCOUNTER — Emergency Department (HOSPITAL_COMMUNITY)
Admission: EM | Admit: 2015-11-12 | Discharge: 2015-11-15 | Disposition: A | Discharge status: Other Acute Inpatient Hospital | Payer: Medicaid Other | Attending: Emergency Medicine | Admitting: Emergency Medicine

## 2015-11-12 DIAGNOSIS — Z046 Encounter for general psychiatric examination, requested by authority: Secondary | ICD-10-CM | POA: Diagnosis present

## 2015-11-12 DIAGNOSIS — R45851 Suicidal ideations: Secondary | ICD-10-CM | POA: Insufficient documentation

## 2015-11-12 DIAGNOSIS — M1990 Unspecified osteoarthritis, unspecified site: Secondary | ICD-10-CM | POA: Insufficient documentation

## 2015-11-12 DIAGNOSIS — I10 Essential (primary) hypertension: Secondary | ICD-10-CM | POA: Diagnosis not present

## 2015-11-12 DIAGNOSIS — F17210 Nicotine dependence, cigarettes, uncomplicated: Secondary | ICD-10-CM | POA: Diagnosis not present

## 2015-11-12 DIAGNOSIS — Z79899 Other long term (current) drug therapy: Secondary | ICD-10-CM | POA: Diagnosis not present

## 2015-11-12 DIAGNOSIS — J449 Chronic obstructive pulmonary disease, unspecified: Secondary | ICD-10-CM | POA: Insufficient documentation

## 2015-11-12 DIAGNOSIS — F329 Major depressive disorder, single episode, unspecified: Secondary | ICD-10-CM | POA: Diagnosis not present

## 2015-11-12 LAB — CBC WITH DIFFERENTIAL/PLATELET
BASOS ABS: 0.1 10*3/uL (ref 0.0–0.1)
Basophils Relative: 0 %
EOS PCT: 1 %
Eosinophils Absolute: 0.1 10*3/uL (ref 0.0–0.7)
HEMATOCRIT: 45.5 % (ref 36.0–46.0)
Hemoglobin: 14.8 g/dL (ref 12.0–15.0)
LYMPHS ABS: 2.9 10*3/uL (ref 0.7–4.0)
LYMPHS PCT: 26 %
MCH: 31.2 pg (ref 26.0–34.0)
MCHC: 32.5 g/dL (ref 30.0–36.0)
MCV: 96 fL (ref 78.0–100.0)
MONO ABS: 0.6 10*3/uL (ref 0.1–1.0)
Monocytes Relative: 5 %
Neutro Abs: 7.6 10*3/uL (ref 1.7–7.7)
Neutrophils Relative %: 68 %
Platelets: 249 10*3/uL (ref 150–400)
RBC: 4.74 MIL/uL (ref 3.87–5.11)
RDW: 13.1 % (ref 11.5–15.5)
WBC: 11.2 10*3/uL — AB (ref 4.0–10.5)

## 2015-11-12 LAB — BASIC METABOLIC PANEL
Anion gap: 6 (ref 5–15)
BUN: 10 mg/dL (ref 6–20)
CHLORIDE: 105 mmol/L (ref 101–111)
CO2: 31 mmol/L (ref 22–32)
Calcium: 9.5 mg/dL (ref 8.9–10.3)
Creatinine, Ser: 0.68 mg/dL (ref 0.44–1.00)
GFR calc Af Amer: >60 mL/min (ref >60)
GFR calc non Af Amer: >60 mL/min (ref >60)
GLUCOSE: 128 mg/dL — AB (ref 65–99)
POTASSIUM: 3.5 mmol/L (ref 3.5–5.1)
Sodium: 142 mmol/L (ref 135–145)

## 2015-11-12 LAB — RAPID URINE DRUG SCREEN, HOSP PERFORMED
AMPHETAMINES: NOT DETECTED
Barbiturates: NOT DETECTED
Benzodiazepines: POSITIVE — AB
Cocaine: NOT DETECTED
Opiates: NOT DETECTED
Tetrahydrocannabinol: NOT DETECTED

## 2015-11-12 LAB — URINALYSIS, ROUTINE W REFLEX MICROSCOPIC
BILIRUBIN URINE: NEGATIVE
Glucose, UA: NEGATIVE mg/dL
Hgb urine dipstick: NEGATIVE
KETONES UR: NEGATIVE mg/dL
Leukocytes, UA: NEGATIVE
NITRITE: NEGATIVE
PROTEIN: NEGATIVE mg/dL
SPECIFIC GRAVITY, URINE: 1.01 (ref 1.005–1.030)
pH: 6.5 (ref 5.0–8.0)

## 2015-11-12 LAB — HEPATIC FUNCTION PANEL
ALBUMIN: 3.9 g/dL (ref 3.5–5.0)
ALK PHOS: 54 U/L (ref 38–126)
ALT: 11 U/L — ABNORMAL LOW (ref 14–54)
AST: 18 U/L (ref 15–41)
Bilirubin, Direct: 0.1 mg/dL (ref 0.1–0.5)
Indirect Bilirubin: 0.3 mg/dL (ref 0.3–0.9)
TOTAL PROTEIN: 7 g/dL (ref 6.5–8.1)
Total Bilirubin: 0.4 mg/dL (ref 0.3–1.2)

## 2015-11-12 LAB — ACETAMINOPHEN LEVEL

## 2015-11-12 LAB — ETHANOL

## 2015-11-12 LAB — SALICYLATE LEVEL

## 2015-11-12 MED ORDER — TRAZODONE HCL 50 MG PO TABS
200.0000 mg | ORAL_TABLET | Freq: Every evening | ORAL | Status: DC | PRN
  Administered 2015-11-12 – 2015-11-14 (×2): 200 mg via ORAL
  Filled 2015-11-12 (×2): qty 4

## 2015-11-12 MED ORDER — ESTROGENS CONJUGATED 0.3 MG PO TABS
0.3000 mg | ORAL_TABLET | Freq: Every day | ORAL | Status: DC
  Administered 2015-11-13 – 2015-11-15 (×3): 0.3 mg via ORAL
  Filled 2015-11-12 (×5): qty 1

## 2015-11-12 MED ORDER — OXYCODONE HCL 5 MG PO TABS
15.0000 mg | ORAL_TABLET | Freq: Four times a day (QID) | ORAL | Status: DC
  Administered 2015-11-12 – 2015-11-15 (×12): 15 mg via ORAL
  Filled 2015-11-12 (×13): qty 3

## 2015-11-12 MED ORDER — IBUPROFEN 400 MG PO TABS: 600.0000 mg | ORAL_TABLET | Freq: Three times a day (TID) | ORAL | Status: DC | PRN

## 2015-11-12 MED ORDER — PANTOPRAZOLE SODIUM 40 MG PO TBEC
40.0000 mg | DELAYED_RELEASE_TABLET | Freq: Every day | ORAL | Status: DC
  Administered 2015-11-12 – 2015-11-15 (×4): 40 mg via ORAL
  Filled 2015-11-12 (×4): qty 1

## 2015-11-12 MED ORDER — ALBUTEROL SULFATE HFA 108 (90 BASE) MCG/ACT IN AERS: 2.0000 | INHALATION_SPRAY | RESPIRATORY_TRACT | Status: DC | PRN

## 2015-11-12 MED ORDER — ALUM & MAG HYDROXIDE-SIMETH 200-200-20 MG/5ML PO SUSP
30.0000 mL | ORAL | Status: DC | PRN
  Administered 2015-11-14 (×2): 30 mL via ORAL
  Filled 2015-11-12 (×2): qty 30

## 2015-11-12 MED ORDER — ZOLPIDEM TARTRATE 5 MG PO TABS
5.0000 mg | ORAL_TABLET | Freq: Every evening | ORAL | Status: DC | PRN
  Administered 2015-11-13: 5 mg via ORAL
  Filled 2015-11-12: qty 1

## 2015-11-12 MED ORDER — NICOTINE 21 MG/24HR TD PT24
21.0000 mg | MEDICATED_PATCH | Freq: Every day | TRANSDERMAL | Status: DC | PRN
  Administered 2015-11-12 – 2015-11-14 (×3): 21 mg via TRANSDERMAL
  Filled 2015-11-12 (×3): qty 1

## 2015-11-12 MED ORDER — OXCARBAZEPINE 300 MG PO TABS
600.0000 mg | ORAL_TABLET | Freq: Two times a day (BID) | ORAL | Status: DC
  Administered 2015-11-12 – 2015-11-15 (×6): 600 mg via ORAL
  Filled 2015-11-12 (×10): qty 2

## 2015-11-12 MED ORDER — DIAZEPAM 5 MG PO TABS
5.0000 mg | ORAL_TABLET | Freq: Three times a day (TID) | ORAL | Status: DC
  Administered 2015-11-12 – 2015-11-15 (×9): 5 mg via ORAL
  Filled 2015-11-12 (×9): qty 1

## 2015-11-12 MED ORDER — ACETAMINOPHEN 325 MG PO TABS: 650.0000 mg | ORAL_TABLET | ORAL | Status: DC | PRN

## 2015-11-12 MED ORDER — ONDANSETRON HCL 4 MG PO TABS
4.0000 mg | ORAL_TABLET | Freq: Three times a day (TID) | ORAL | Status: DC | PRN
  Administered 2015-11-13: 4 mg via ORAL
  Filled 2015-11-12: qty 1

## 2015-11-12 MED ORDER — OXCARBAZEPINE 300 MG PO TABS
ORAL_TABLET | ORAL | Status: AC
  Filled 2015-11-12: qty 2

## 2015-11-12 MED ORDER — UMECLIDINIUM-VILANTEROL 62.5-25 MCG/INH IN AEPB
1.0000 | INHALATION_SPRAY | Freq: Every day | RESPIRATORY_TRACT | Status: DC
  Administered 2015-11-14 – 2015-11-15 (×2): 1 via RESPIRATORY_TRACT
  Filled 2015-11-12: qty 14

## 2015-11-12 MED ORDER — PRAVASTATIN SODIUM 40 MG PO TABS
40.0000 mg | ORAL_TABLET | Freq: Every day | ORAL | Status: DC
  Administered 2015-11-13 – 2015-11-15 (×3): 40 mg via ORAL
  Filled 2015-11-12 (×5): qty 1

## 2015-11-12 NOTE — ED Notes (Addendum)
Pt stating to this nurse that, "I can't go to [facility] because they can't meet her health needs." "It is so impersonal and I'd rather speak to my doctor, Dr. Humberto Leep." Pt stating, "I need my oxygen from my cpap for my sleep apnea." This nurse advised the pt that I would speak to the doctor about this but we were concerned about her due to her trying to wrap a cord around her neck earlier." Pt then states, "well, if I don't have it, I will stop breathing in my sleep." "I guess that's an easier way to go then trying to wrap a cord around my neck and suffocating." Pt then states, "or I can stab myself." "Please tell everyone I love them, I really do, from the bottom of my heart because I am getting ready to fall asleep and I am going to quit breathing and die." "Please don't forget to tell them what I said." Pt also stating, "I really don't feel like speaking to these people, I really don't. I'm just going to go to sleep." This nurse made pt aware that I was keeping an eye on her and so was the sitter at bedside. This, RN will notify EDP of pt's comments and request for her cpap.

## 2015-11-12 NOTE — ED Notes (Signed)
Pt requesting for her son to bring her home stool softener Papaya. EDP made aware and agreed. EDP asking if pt could ask her son to bring her cpap machine. Pt states, "I don't want my son to bring it because he might break it." This RN called respiratory and advised him that this pt would need a cpap machine at bedtime. Verbal orders received by Dr. Eulis Foster to place an order for cpap. Order placed.
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
## 2015-11-12 NOTE — ED Notes (Signed)
Respiratory in with pt

## 2015-11-12 NOTE — ED Notes (Signed)
Pt still receiving telepsych
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
## 2015-11-13 MED ORDER — FLUTICASONE PROPIONATE 50 MCG/ACT NA SUSP
1.0000 | Freq: Every day | NASAL | Status: DC
  Administered 2015-11-13 – 2015-11-15 (×4): 1 via NASAL
  Filled 2015-11-13: qty 16

## 2015-11-13 MED ORDER — FLUTICASONE PROPIONATE 50 MCG/ACT NA SUSP
NASAL | Status: AC
  Filled 2015-11-13: qty 16

## 2015-11-13 MED ORDER — LORATADINE 10 MG PO TABS
10.0000 mg | ORAL_TABLET | Freq: Once | ORAL | Status: AC
  Administered 2015-11-13: 10 mg via ORAL
  Filled 2015-11-13: qty 1

## 2015-11-13 NOTE — ED Notes (Signed)
Pt given phone for personal use. Pt called [facility] and was asking for patient satisfaction. Primary nurse and charge nurse were made aware of the situation. Pt states she was calling them because she wasn't being given her pain medication. Pt was made aware that our chart states we are to give her pain medication along with her daily medications at 1000 today. Charge agreed it was ok to give patient mediation at 0920. Phone was taken from patient.

## 2015-11-13 NOTE — ED Notes (Signed)
[place], Sitter reported that patient told her "If I really wanted to kill myself, I would use this cord on this cpap machine. "   Cpap machine removed from the room and told pt to notify us when she needed it.

## 2015-11-13 NOTE — ED Notes (Signed)
Pt son arrived and dropped off her briefs and a bottle labeled as shampoo. 4 briefs placed in room with pt. Remaining briefs and shampoo placed with pt's other belongings by [employer]. Pt requesting soap, washcloth and towel to bathe. Also requesting additional socks and scrubs.

## 2015-11-13 NOTE — ED Notes (Signed)
Pt requested to have her cpap mask cleaned so she could take a nap. Pt states she will clean it if we will provide her a wipe to use. She was given a wipe, cleaned the mask and applied same herself. Sitter remained at bedside.

## 2015-11-13 NOTE — Progress Notes (Signed)
Inpatient psych treatment has been recommended for pt per TTS assessment. Per [place], pt able to be considered for [facility] only if pt able to be without CPAP machine.  Referred to: [facility]- per [place]- per [facility]- per Sumner Boast, MSW, LCSW Clinical Social Work, Disposition  11/13/2015 [phone]

## 2015-11-13 NOTE — ED Notes (Signed)
Pt is now wearing her CPAP at is sleeping. Equal rise and fall of her chest is noted.

## 2015-11-13 NOTE — ED Notes (Signed)
Patient safety sitter provided pt with playing cards. Pt sitting quietly on bed playing solitaire.

## 2015-11-13 NOTE — ED Notes (Signed)
Pt also requesting to speak to administration.  Notified Kamirra Jaskowiak and she is in talking to pt.

## 2015-11-13 NOTE — ED Notes (Addendum)
Pt is becoming agitated and requesting to speak with Sherease again. This RN spoke with the pt and was able to calm her down. Pt states she needs to speak with her in reference to the previous sitter "lying on me". States she did not threaten to commit suicide.

## 2015-11-13 NOTE — ED Notes (Signed)
Dietary arrived with pt tray and apples x 2 as between meal snacks. Apples placed in ziplock bag labeled with pt room number at nurses station.

## 2015-11-13 NOTE — ED Notes (Signed)
Pt given meal tray. Pt alert and oriented.

## 2015-11-14 MED ORDER — OXCARBAZEPINE 300 MG PO TABS
ORAL_TABLET | ORAL | Status: AC
  Filled 2015-11-14: qty 2

## 2015-11-14 MED ORDER — HYDROXYZINE HCL 25 MG PO TABS
50.0000 mg | ORAL_TABLET | ORAL | Status: AC
  Administered 2015-11-14: 50 mg via ORAL
  Filled 2015-11-14: qty 2

## 2015-11-14 MED ORDER — HYDROXYZINE HCL 25 MG PO TABS
25.0000 mg | ORAL_TABLET | Freq: Four times a day (QID) | ORAL | Status: DC | PRN
  Administered 2015-11-14 – 2015-11-15 (×2): 25 mg via ORAL
  Filled 2015-11-14 (×2): qty 1

## 2015-11-14 NOTE — ED Notes (Signed)
Pt up to bathroom.

## 2015-11-14 NOTE — ED Notes (Signed)
Pt currently awake and speaking of her nexium and why she should have it here. Respiratory has been in to assess her C pap maching and pt is encouraged to try to sleep.

## 2015-11-14 NOTE — ED Notes (Signed)
Pt c/o pain to nose due to cpap machine; pt given a small piece of cushion to help relieve some of pressure

## 2015-11-14 NOTE — ED Notes (Signed)
Pt speaking with Emeline Darling RN at this time

## 2015-11-14 NOTE — ED Notes (Signed)
Pt reports that her nicotine patch fell off during shower- there is no patch on her body- patch applies to R arm in attempt to calm pt as well as visteril 25 mg po. She continues to be hyper-verbal with pressured speech, with fixation of ideas- currently fixated on papaya and her distended stomach. She is agitated and not easily redirected.

## 2015-11-14 NOTE — Progress Notes (Signed)
This writer completed a chart review for disposition.    Jkayla Spiewak, MSW, LCSW, LCAS [facility] Triage Specialist [phone] 

## 2015-11-14 NOTE — ED Notes (Signed)
Pt is less agitated- she is currently eating an apple

## 2015-11-14 NOTE — ED Notes (Signed)
Pt with pressured speech speaking of need for privacy when showering, of her itching and needing lotion as well as conditioner for her hair as it is naturally curly and frizzing when she washes it without conditioner. She is awake almost fixated upon her need to shower condition and lotion for her skin dryness, itching, hair frizzing etc.  Order received for meds which when presented to pt and the benefit of less itching and hopefully calmer that pt might rest, pt agreed to take.

## 2015-11-14 NOTE — ED Notes (Signed)
Pt states security has her papaya.  Called security and security states they do not have pts papaya.  Pt calling son to get papaya from home.  Pt did take her vitamin b

## 2015-11-14 NOTE — ED Notes (Signed)
Pt out of bed to bathroom- she is currently awake alert, but not as agitated or hyperverbal

## 2015-11-14 NOTE — ED Notes (Signed)
Pt lying down in bed, quiet at this time

## 2015-11-14 NOTE — ED Notes (Signed)
Pt currently reports itching and is applying lotion to her extremities

## 2015-11-14 NOTE — ED Notes (Signed)
Respiratory in to evaluate pt's C pap machine

## 2015-11-14 NOTE — ED Notes (Signed)
Spoke to Spray , Therapist, sports, [facility] regarding pt length of stay in this ED

## 2015-11-14 NOTE — ED Notes (Signed)
Ok for pt to take own papaya extract and vitamin b.

## 2015-11-15 ENCOUNTER — Inpatient Hospital Stay
Admission: EM | Admit: 2015-11-15 | Discharge: 2015-11-23 | DRG: 885 | Disposition: A | Discharge status: Home / Self Care | Payer: Medicaid Other | Source: Intra-hospital | Attending: Psychiatry | Admitting: Psychiatry

## 2015-11-15 ENCOUNTER — Encounter: Payer: Self-pay | Admitting: Psychiatry

## 2015-11-15 DIAGNOSIS — Z888 Allergy status to other drugs, medicaments and biological substances status: Secondary | ICD-10-CM | POA: Diagnosis not present

## 2015-11-15 DIAGNOSIS — E785 Hyperlipidemia, unspecified: Secondary | ICD-10-CM | POA: Diagnosis present

## 2015-11-15 DIAGNOSIS — F3163 Bipolar disorder, current episode mixed, severe, without psychotic features: Secondary | ICD-10-CM | POA: Diagnosis present

## 2015-11-15 DIAGNOSIS — Z886 Allergy status to analgesic agent status: Secondary | ICD-10-CM

## 2015-11-15 DIAGNOSIS — R32 Unspecified urinary incontinence: Secondary | ICD-10-CM | POA: Diagnosis present

## 2015-11-15 DIAGNOSIS — G8929 Other chronic pain: Secondary | ICD-10-CM | POA: Diagnosis present

## 2015-11-15 DIAGNOSIS — G4730 Sleep apnea, unspecified: Secondary | ICD-10-CM | POA: Diagnosis present

## 2015-11-15 DIAGNOSIS — Z90710 Acquired absence of both cervix and uterus: Secondary | ICD-10-CM

## 2015-11-15 DIAGNOSIS — F429 Obsessive-compulsive disorder, unspecified: Secondary | ICD-10-CM | POA: Diagnosis present

## 2015-11-15 DIAGNOSIS — R45851 Suicidal ideations: Secondary | ICD-10-CM | POA: Diagnosis not present

## 2015-11-15 DIAGNOSIS — F4310 Post-traumatic stress disorder, unspecified: Secondary | ICD-10-CM | POA: Diagnosis present

## 2015-11-15 DIAGNOSIS — G4700 Insomnia, unspecified: Secondary | ICD-10-CM | POA: Diagnosis present

## 2015-11-15 DIAGNOSIS — F3164 Bipolar disorder, current episode mixed, severe, with psychotic features: Secondary | ICD-10-CM | POA: Diagnosis present

## 2015-11-15 DIAGNOSIS — Z825 Family history of asthma and other chronic lower respiratory diseases: Secondary | ICD-10-CM | POA: Diagnosis not present

## 2015-11-15 DIAGNOSIS — Z9889 Other specified postprocedural states: Secondary | ICD-10-CM | POA: Diagnosis not present

## 2015-11-15 DIAGNOSIS — M549 Dorsalgia, unspecified: Secondary | ICD-10-CM | POA: Diagnosis present

## 2015-11-15 DIAGNOSIS — Z8711 Personal history of peptic ulcer disease: Secondary | ICD-10-CM | POA: Diagnosis not present

## 2015-11-15 DIAGNOSIS — Z87442 Personal history of urinary calculi: Secondary | ICD-10-CM

## 2015-11-15 DIAGNOSIS — Z833 Family history of diabetes mellitus: Secondary | ICD-10-CM | POA: Diagnosis not present

## 2015-11-15 DIAGNOSIS — F410 Panic disorder [episodic paroxysmal anxiety] without agoraphobia: Secondary | ICD-10-CM | POA: Diagnosis present

## 2015-11-15 DIAGNOSIS — K219 Gastro-esophageal reflux disease without esophagitis: Secondary | ICD-10-CM | POA: Diagnosis present

## 2015-11-15 DIAGNOSIS — Z79899 Other long term (current) drug therapy: Secondary | ICD-10-CM | POA: Diagnosis not present

## 2015-11-15 DIAGNOSIS — J449 Chronic obstructive pulmonary disease, unspecified: Secondary | ICD-10-CM | POA: Diagnosis present

## 2015-11-15 DIAGNOSIS — F17200 Nicotine dependence, unspecified, uncomplicated: Secondary | ICD-10-CM | POA: Diagnosis present

## 2015-11-15 DIAGNOSIS — Z811 Family history of alcohol abuse and dependence: Secondary | ICD-10-CM

## 2015-11-15 DIAGNOSIS — I10 Essential (primary) hypertension: Secondary | ICD-10-CM | POA: Diagnosis present

## 2015-11-15 MED ORDER — ALBUTEROL SULFATE (2.5 MG/3ML) 0.083% IN NEBU: 3.0000 mL | INHALATION_SOLUTION | Freq: Four times a day (QID) | RESPIRATORY_TRACT | Status: DC | PRN

## 2015-11-15 MED ORDER — NON FORMULARY: 40.0000 mg | Freq: Every day | Status: DC

## 2015-11-15 MED ORDER — PRAVASTATIN SODIUM 20 MG PO TABS
40.0000 mg | ORAL_TABLET | Freq: Every day | ORAL | Status: DC
  Administered 2015-11-17 – 2015-11-22 (×6): 40 mg via ORAL
  Filled 2015-11-15 (×7): qty 2

## 2015-11-15 MED ORDER — ALUM & MAG HYDROXIDE-SIMETH 200-200-20 MG/5ML PO SUSP
30.0000 mL | ORAL | Status: DC | PRN
  Administered 2015-11-17: 30 mL via ORAL
  Filled 2015-11-15 (×2): qty 30

## 2015-11-15 MED ORDER — ESOMEPRAZOLE MAGNESIUM 40 MG PO CPDR: 40.0000 mg | DELAYED_RELEASE_CAPSULE | Freq: Two times a day (BID) | ORAL | Status: DC

## 2015-11-15 MED ORDER — TRAZODONE HCL 100 MG PO TABS
300.0000 mg | ORAL_TABLET | Freq: Every day | ORAL | Status: DC
  Administered 2015-11-15: 300 mg via ORAL
  Filled 2015-11-15: qty 3

## 2015-11-15 MED ORDER — MAGNESIUM HYDROXIDE 400 MG/5ML PO SUSP
30.0000 mL | Freq: Every day | ORAL | Status: DC | PRN
  Administered 2015-11-17 – 2015-11-18 (×2): 30 mL via ORAL
  Filled 2015-11-15 (×2): qty 30

## 2015-11-15 MED ORDER — ESTROGENS CONJUGATED 0.3 MG PO TABS
0.3000 mg | ORAL_TABLET | Freq: Every day | ORAL | Status: DC
Start: 2015-11-16 — End: 2015-11-21
  Administered 2015-11-16 – 2015-11-21 (×6): 0.3 mg via ORAL
  Filled 2015-11-15 (×6): qty 1

## 2015-11-15 MED ORDER — LORAZEPAM 2 MG PO TABS
2.0000 mg | ORAL_TABLET | Freq: Three times a day (TID) | ORAL | Status: DC | PRN
Start: 2015-11-15 — End: 2015-11-15

## 2015-11-15 MED ORDER — PANTOPRAZOLE SODIUM 40 MG PO TBEC
40.0000 mg | DELAYED_RELEASE_TABLET | Freq: Two times a day (BID) | ORAL | Status: DC
  Administered 2015-11-15 – 2015-11-18 (×6): 40 mg via ORAL
  Filled 2015-11-15 (×8): qty 1

## 2015-11-15 MED ORDER — OXYCODONE HCL 5 MG PO TABS
5.0000 mg | ORAL_TABLET | Freq: Once | ORAL | Status: AC
  Administered 2015-11-15: 5 mg via ORAL
  Filled 2015-11-15: qty 1

## 2015-11-15 MED ORDER — OXYCODONE HCL 5 MG PO TABS
10.0000 mg | ORAL_TABLET | Freq: Three times a day (TID) | ORAL | Status: DC | PRN
  Administered 2015-11-15: 10 mg via ORAL
  Filled 2015-11-15: qty 2

## 2015-11-15 MED ORDER — CARBAMAZEPINE 200 MG PO TABS: 200.0000 mg | ORAL_TABLET | Freq: Two times a day (BID) | ORAL | Status: DC

## 2015-11-15 MED ORDER — OXCARBAZEPINE 300 MG PO TABS
300.0000 mg | ORAL_TABLET | Freq: Two times a day (BID) | ORAL | Status: DC
  Administered 2015-11-15 – 2015-11-21 (×12): 300 mg via ORAL
  Filled 2015-11-15 (×12): qty 1

## 2015-11-15 MED ORDER — ACETAMINOPHEN 325 MG PO TABS
650.0000 mg | ORAL_TABLET | Freq: Four times a day (QID) | ORAL | Status: DC | PRN
  Administered 2015-11-18 – 2015-11-21 (×2): 650 mg via ORAL
  Filled 2015-11-15 (×2): qty 2

## 2015-11-15 MED ORDER — CYCLOBENZAPRINE HCL 10 MG PO TABS
10.0000 mg | ORAL_TABLET | Freq: Four times a day (QID) | ORAL | Status: DC | PRN
  Administered 2015-11-15 – 2015-11-16 (×2): 10 mg via ORAL
  Filled 2015-11-15 (×2): qty 1

## 2015-11-15 MED ORDER — NICOTINE 21 MG/24HR TD PT24
21.0000 mg | MEDICATED_PATCH | Freq: Every day | TRANSDERMAL | Status: DC
  Administered 2015-11-16 – 2015-11-23 (×7): 21 mg via TRANSDERMAL
  Filled 2015-11-15 (×7): qty 1

## 2015-11-15 MED ORDER — DIAZEPAM 5 MG PO TABS
5.0000 mg | ORAL_TABLET | Freq: Four times a day (QID) | ORAL | Status: DC | PRN
  Administered 2015-11-15 – 2015-11-16 (×2): 5 mg via ORAL
  Filled 2015-11-15 (×2): qty 1

## 2015-11-15 MED ORDER — RISPERIDONE 3 MG PO TABS
3.0000 mg | ORAL_TABLET | Freq: Two times a day (BID) | ORAL | Status: DC
  Administered 2015-11-15 – 2015-11-16 (×3): 3 mg via ORAL
  Filled 2015-11-15 (×4): qty 1

## 2015-11-15 MED ORDER — TRAZODONE HCL 150 MG PO TABS
600.0000 mg | ORAL_TABLET | Freq: Every evening | ORAL | Status: DC | PRN
  Filled 2015-11-15: qty 4

## 2015-11-15 MED ORDER — OXYCODONE HCL 5 MG PO TABS
15.0000 mg | ORAL_TABLET | Freq: Three times a day (TID) | ORAL | Status: DC | PRN
  Administered 2015-11-16 – 2015-11-19 (×5): 15 mg via ORAL
  Filled 2015-11-15 (×5): qty 3

## 2015-11-15 MED ORDER — TRAZODONE HCL 100 MG PO TABS
300.0000 mg | ORAL_TABLET | Freq: Every day | ORAL | Status: DC
  Administered 2015-11-16 – 2015-11-22 (×7): 300 mg via ORAL
  Filled 2015-11-15 (×7): qty 3

## 2015-11-15 MED ORDER — CHLORDIAZEPOXIDE HCL 25 MG PO CAPS: 25.0000 mg | ORAL_CAPSULE | Freq: Three times a day (TID) | ORAL | Status: DC

## 2015-11-15 NOTE — Tx Team (Signed)
Initial Interdisciplinary Treatment Plan   PATIENT STRESSORS: Health problems Marital or family conflict   PATIENT STRENGTHS: Active sense of humor Capable of independent living Motivation for treatment/growth   PROBLEM LIST: Problem List/Patient Goals Date to be addressed Date deferred Reason deferred Estimated date of resolution  "develop better coping skills"       Chronic Pain      PTSD                                           DISCHARGE CRITERIA:  Ability to meet basic life and health needs  PRELIMINARY DISCHARGE PLAN: Return to previous living arrangement  PATIENT/FAMIILY INVOLVEMENT: This treatment plan has been presented to and reviewed with the patient, [employer], and/or family member, .  The patient and family have been given the opportunity to ask questions and make suggestions.  Elige Radon B 11/15/2015, 6:31 PM

## 2015-11-15 NOTE — ED Notes (Signed)
Awake sipping pos

## 2015-11-15 NOTE — BH Assessment (Signed)
Patient has been accepted to [facility].  Accepting physician is Dr. Dwyane Dee.  Attending Physician will be Dr. Bary Leriche.  Patient has been assigned to room 315, by [place].  Call report to [phone].  Representative/Transfer Coordinator is Madison Hickman [facility], [place] Work) made aware of acceptance.

## 2015-11-15 NOTE — ED Notes (Signed)
Pt requesting shower. Security aware.

## 2015-11-15 NOTE — ED Notes (Addendum)
Pt's son brought pt nexium and restasis drops. Pharmacy aware and reported bring dose to pharmacy.   Per Security, pt son was given patient laptop when he dropped off patient medications. Pt upset and reports, "That wasn't his decision, I am talking to administration once I get out of this place."

## 2015-11-15 NOTE — ED Notes (Signed)
Pt has a bed at [facility], room 315, accepting Cynthia Deleon, called report to [phone]

## 2015-11-15 NOTE — ED Notes (Addendum)
Per [facility], [facility] is looking at pt for placement. [facility] reported pt could have a bed this afternoon.  If pt does not go to [facility] today, hospital bed will be provided for pt in substitute for ED stretcher.

## 2015-11-15 NOTE — Progress Notes (Signed)
Received to the unit in scrubs.  Hyper rverbal with flight of ideas.  Denies SI/HI/AVH. Body search and skin assessment performed.  No contraband found.  Skin unremarkable.

## 2015-11-15 NOTE — ED Notes (Signed)
Pt on phone with son requesting that he bring her papaya that she has spoken of throughout the last 20 hours. She is informed that son can leave with scurity and this RN will obtain for her.

## 2015-11-15 NOTE — ED Notes (Signed)
Pt requesting papaya pills and vitamin b. Pt's trazadone dosage order at night is wrong per pt medication list and pt. Pt currently getting 200mg  PRN at night when dosage at home is 600mg  PRN. EDP reported okay to administer papaya pills and vitamin b per bottle instructions and reported would change nightly dose of Trazadone.

## 2015-11-15 NOTE — ED Notes (Signed)
[facility] [employer] arrived prior to taking medications to pharmacy. Patient medications given to transport service along with patient pocketbook,green suitcase and patient belonging bag.

## 2015-11-15 NOTE — ED Notes (Signed)
Son has delivered papaya and pt's computer - Pt's computer is going to be kept in the security office while pt is in house

## 2015-11-15 NOTE — ED Notes (Addendum)
Pt requested to notify son of disposition. Attempted to call pts son per pt's contact information, [phone]. Went to [employer], did not leave message. Contacted receiving RN regarding son contacts information.  Pt requesting jewelry. One gold chain with three charms, additional gold chain with bull, and watch given to pt at transfer. Security and [employer] driver present when patient given jewelry.

## 2015-11-15 NOTE — ED Notes (Signed)
Pt is currently using the hospitals c pap

## 2015-11-15 NOTE — ED Notes (Addendum)
Pt provided a hospital bed and is resting comfortably.

## 2015-11-15 NOTE — ED Notes (Signed)
Pt requested more papaya pills from staff. Her request was denied encouraging pt to return to bed and try to sleep with education about restorative sleep, how it resets the brain, problem solves while sleeping and the body has a chace to recover while resting.

## 2015-11-15 NOTE — ED Notes (Signed)
Pt lying on back, eyes closed with even unlabored respirations-

## 2015-11-16 DIAGNOSIS — F3164 Bipolar disorder, current episode mixed, severe, with psychotic features: Principal | ICD-10-CM

## 2015-11-16 LAB — HEMOGLOBIN A1C: Hgb A1c MFr Bld: 6.4 % — ABNORMAL HIGH (ref 4.0–6.0)

## 2015-11-16 LAB — LIPID PANEL
Cholesterol: 172 mg/dL (ref 0–200)
HDL: 42 mg/dL (ref >40)
LDL CALC: 82 mg/dL (ref 0–99)
TRIGLYCERIDES: 240 mg/dL — AB (ref <150)
Total CHOL/HDL Ratio: 4.1 RATIO
VLDL: 48 mg/dL — AB (ref 0–40)

## 2015-11-16 LAB — TSH: TSH: 0.873 u[IU]/mL (ref 0.350–4.500)

## 2015-11-16 NOTE — [facility] Group Notes (Signed)
[place] LCSW Group Therapy   11/16/2015 9:30am   Type of Therapy: Group Therapy   Participation Level: Active   Participation Quality: Attentive, Sharing and Supportive   Affect: Appropriate   Cognitive: Alert and Oriented   Insight: Developing/Improving and Engaged   Engagement in Therapy: Developing/Improving and Engaged   Modes of Intervention: Clarification, Confrontation, Discussion, Education, Exploration, Limit-setting, Orientation, Problem-solving, Rapport Building, Art therapist, Socialization and Support   Summary of Progress/Problems: The topic for group was balance in life. Today's group focused on defining balance in one's own words, identifying things that can knock one off balance, and exploring healthy ways to maintain balance in life. Group members were asked to provide an example of a time when they felt off balance, describe how they handled that situation, and process healthier ways to regain balance in the future. Group members were asked to share the most important tool for maintaining balance that they learned while at [facility] and how they plan to apply this method after discharge. Pt shared with the group that one of her obstacles towards achieving balance is the incompetence of the medical staff at [facility] and their lack of medical knowledge regarding the pt's sleeping machine.  Pt shared the staff doesn't understand her diagnoses and that the staff is in denial.  Pt shared she didn't like the medications given and that she had demanded she see the doctors for treatment and and hasn't and that she is suffering consequences as a result. Pt became extremely agitated and dominated group sharing and did not allow others to share unless re-directed by the CSW.  Pt shared in the future the pt will not return to [facility] in order to achieve balance.  Alphonse Guild. Shalawn Wynder, LCSWA, LCAS  11/16/15

## 2015-11-16 NOTE — Plan of Care (Signed)
Problem: Coping: Goal: Ability to identify and develop effective coping behavior will improve Outcome: Not Progressing Doesn't feel that she needs to be here.  States "ya'll tricked me into coming here"
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
## 2015-11-16 NOTE — Progress Notes (Signed)
Recreation Therapy Notes  Date: 06.08.17 Time: 9:30 am Location: Craft Room  Group Topic: Leisure Education  Goal Area(s) Addresses:  Patient will identify things they are grateful for. Patient will verbalize why it is important to be grateful.  Behavioral Response: Did not attend  Intervention: Grateful Wheel  Activity: Patients were given an I Am Grateful For worksheet and instructed to write things they are grateful for under each category.  Education: LRT educated patients on why it is important to be grateful.  Education Outcome: Patient did not attend group.  Clinical Observations/Feedback: Patient did not attend group.  Leonette Monarch, LRT/CTRS 11/16/2015 10:26 AM

## 2015-11-16 NOTE — Progress Notes (Signed)
D: Pt denies SI/HI/AVH. Pt is angry, upset and furious because some of her medication has been changed and some medcation not in the [facility]. MD on call notified and the issue was rectified. Pt's affect is blunted, hyper verbal, loud and  argumentative she was frequently redirected for safety. Patient is not interacting with peers and staff appropriately.  A: Pt was offered support and encouragement. Pt was given scheduled medications. Pt was encouraged to attend groups. Q 15 minute checks were done for safety. Patient was placed on C- PAP machine for sleep apnea.  R:Pt did not attend group. Pt is complaint with medication. Pt  Is not receptive to treatment on the unit. Safety maintained on unit.

## 2015-11-16 NOTE — Progress Notes (Signed)
D:  Denies SI/HI/AVH.  Affect is angry.  Patient is argumentative and blames this staff and [facility] staff for being here. Verbalized that she is here for medical problems and not mental.  Speech is loud, rapid, disorganized and exhibits flight of ideas.  During treatment team patient became upset with Dr. Bary Leriche when Dr. Bary Leriche asked her if she wanted to have her valium or her pain medications because she does not prescribe both because that is dangerous.  Patient states "I want to go home and you can keep your valium and your pain pills. "  After treatment team patient presented to nurses station and states "I told my son that I want the ambulance to transport me back to [facility] because I came here voluntarily"  Attempted to explain to patient that she cannot be released without a doctors order.  Patient states " I am voluntary and not involuntary" explained that regardless if you are voluntary or involuntary you cannot leave this unit without being discharged by the patient. Informed Dr. Bary Leriche that patient wanted to be transferred back [facility].  Dr. Bary Leriche said to give her a 72 hour form and explain that she will still have 72 hours to make a decision.  72 hour form taken to [place].  Explained the 72 hour form.  Patient became agitated and stating that we were keeping her against her will and who signed her in here and that she was going to sue this hospital.  Tried to explain to patient that she signed the consent form to be here while she was at [facility].  Patient states that it was not explained to her that she was coming to a psychiatric hospital. Apologized to her for that and asked if she would like to sign the 72 hour form.  Patient states "I can't sign it because I can't see without my glasses and it would be illegal and I was not able to see at [facility] either"

## 2015-11-16 NOTE — BHH Group Notes (Signed)
[place] LCSW Group Therapy  11/16/2015 3:00 PM  Type of Therapy:  Group Therapy  Participation Level:  Did Not Attend  Modes of Intervention:  Discussion, Education, Socialization and Support  Summary of Progress/Problems: Balance in life: Patients will discuss the concept of balance and how it looks and feels to be unbalanced. Pt will identify areas in their life that is unbalanced and ways to become more balanced.    [place] MSW, [place]  11/16/2015, 3:00 PM
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
## 2015-11-16 NOTE — BHH Suicide Risk Assessment (Signed)
[facility] INPATIENT:  Family/Significant Other Suicide Prevention Education  Suicide Prevention Education:  Patient Refusal for Family/Significant Other Suicide Prevention Education: The patient Cynthia Deleon has refused to provide written consent for family/significant other to be provided Family/Significant Other Suicide Prevention Education during admission and/or prior to discharge.  Physician notified.  [place] MSW, [place]  11/16/2015, 2:43 PM

## 2015-11-16 NOTE — BHH Group Notes (Signed)
[facility] Group Notes:  (Nursing/MHT/Case Management/Adjunct)  Date:  11/16/2015  Time:  11:04 PM  Type of Therapy:  Psychoeducational Skills  Participation Level:  Did Not Attend  Summary of Progress/Problems:  Cynthia Deleon 11/16/2015, 11:04 PM

## 2015-11-16 NOTE — BHH Suicide Risk Assessment (Signed)
[facility] Admission Suicide Risk Assessment   Nursing information obtained from:    Demographic factors:    Current Mental Status:    Loss Factors:    Historical Factors:    Risk Reduction Factors:     Total Time spent with patient: 1 hour Principal Problem: Bipolar I disorder, most recent episode mixed, severe with psychotic features [facility]) Diagnosis:   Patient Active Problem List   Diagnosis Date Noted  . OCD (obsessive compulsive disorder) [F42.9] 11/15/2015  . Tobacco use disorder [F17.200] 11/15/2015  . Bipolar affective disorder, mixed, severe ([place]) [F31.63] 11/15/2015  . Peripheral neuropathy ([place]) [G62.9] 10/10/2015  . Current use of estrogen therapy [Z79.899] 01/12/2014  . PTSD (post-traumatic stress disorder) [F43.10] 08/03/2011  . Bipolar I disorder, most recent episode mixed, severe with psychotic features ([place]) [F31.64]    Subjective Data: Depression, anxiety, agitation.  Continued Clinical Symptoms:  Alcohol Use Disorder Identification Test Final Score (AUDIT): 0 The "Alcohol Use Disorders Identification Test", Guidelines for Use in Primary Care, Second Edition.  World Pharmacologist [facility]). Score between 0-7:  no or low risk or alcohol related problems. Score between 8-15:  moderate risk of alcohol related problems. Score between 16-19:  high risk of alcohol related problems. Score 20 or above:  warrants further diagnostic evaluation for alcohol dependence and treatment.   CLINICAL FACTORS:   Severe Anxiety and/or Agitation Bipolar Disorder:   Mixed State   Musculoskeletal: Strength & Muscle Tone: within normal limits Gait & Station: normal Patient leans: N/A  Psychiatric Specialty Exam: Physical Exam  Nursing note and vitals reviewed.   Review of Systems  Musculoskeletal: Positive for back pain.  Psychiatric/Behavioral: Positive for depression and hallucinations.  All other systems reviewed and are negative.   Blood pressure 130/113, pulse 81,  temperature 98.6 F (37 C), resp. rate 18, height 5\' 9"  (1.753 m), weight 79.833 kg (176 lb), SpO2 95 %.Body mass index is 25.98 kg/(m^2).  General Appearance: Disheveled  Eye Contact:  Fair  Speech:  Pressured  Volume:  Normal  Mood:  Angry, Dysphoric and Irritable  Affect:  Congruent  Thought Process:  Disorganized  Orientation:  Full (Time, Place, and Person)  Thought Content:  Illogical  Suicidal Thoughts:  No  Homicidal Thoughts:  No  Memory:  Immediate;   Fair Recent;   Fair Remote;   Fair  Judgement:  Poor  Insight:  Lacking  Psychomotor Activity:  Increased  Concentration:  Concentration: Fair and Attention Span: Fair  Recall:  [employer] of Knowledge:  Fair  Language:  Fair  Akathisia:  No  Handed:  Right  AIMS (if indicated):     Assets:  Communication Skills Desire for Improvement Financial Resources/Insurance Housing Resilience Social Support  ADL's:  Intact  Cognition:  WNL  Sleep:  Number of Hours: 5.5      COGNITIVE FEATURES THAT CONTRIBUTE TO RISK:  None    SUICIDE RISK:   Moderate:  Frequent suicidal ideation with limited intensity, and duration, some specificity in terms of plans, no associated intent, good self-control, limited dysphoria/symptomatology, some risk factors present, and identifiable protective factors, including available and accessible social support.  PLAN OF CARE: Hospital admission, medication management, discharge planning.  Ms. Dupin is a 60 year old female with history of bipolar disorder, PTSD and chronic pain admitted in distress in the context of family problems.  1. Mood and psychosis. At the patient was restarted on Trileptal for mood stabilization and Risperdal was added for psychosis.  2. Insomnia. She is  on trazodone 300 mg nightly.  3. COPD. She is on albuterol.  4. Sleep apnea. She is on CPAP.  5. Smoking. Nicotine patch is available.  6. GERD. She is on Protonix.  7. Dyslipidemia. She is on Pravachol.    8. Chronic pain. She is on OxyCodone. I called her pharmacy her last prescriptions was given on May 15. She was negative for opiates on admission.  9. Benzodiazepine abuse. The patient has been maintained on Valiums. She was given Tranxene and [facility] emergency room. We will not prescribe benzodiazepines together with painkillers.  10. Constipation. The patient is on bowel regimen.  11. Disposition. She will be discharged to home with family. She will follow-up with [facility] in [place].  I certify that inpatient services furnished can reasonably be expected to improve the patient's condition.   Orson Slick, MD 11/16/2015, 12:53 PM

## 2015-11-17 LAB — PROLACTIN: Prolactin: 51.9 ng/mL — ABNORMAL HIGH (ref 4.8–23.3)

## 2015-11-17 MED ORDER — FLUTICASONE PROPIONATE 50 MCG/ACT NA SUSP
2.0000 | Freq: Every day | NASAL | Status: DC
  Administered 2015-11-17 – 2015-11-21 (×5): 2 via NASAL
  Filled 2015-11-17: qty 16

## 2015-11-17 MED ORDER — DOCUSATE SODIUM 100 MG PO CAPS
100.0000 mg | ORAL_CAPSULE | Freq: Two times a day (BID) | ORAL | Status: DC
  Administered 2015-11-17 – 2015-11-21 (×8): 100 mg via ORAL
  Filled 2015-11-17 (×8): qty 1

## 2015-11-17 MED ORDER — ALBUTEROL SULFATE HFA 108 (90 BASE) MCG/ACT IN AERS
2.0000 | INHALATION_SPRAY | RESPIRATORY_TRACT | Status: DC | PRN
  Filled 2015-11-17 (×4): qty 6.7

## 2015-11-17 MED ORDER — ARIPIPRAZOLE 5 MG PO TABS
15.0000 mg | ORAL_TABLET | Freq: Every day | ORAL | Status: DC
  Administered 2015-11-17 – 2015-11-20 (×4): 15 mg via ORAL
  Filled 2015-11-17 (×4): qty 1

## 2015-11-17 MED ORDER — POLYETHYLENE GLYCOL 3350 17 G PO PACK
17.0000 g | PACK | Freq: Every day | ORAL | Status: DC
  Administered 2015-11-17 – 2015-11-21 (×4): 17 g via ORAL
  Filled 2015-11-17 (×5): qty 1

## 2015-11-17 NOTE — Progress Notes (Signed)
D: Pt denies SI/HI/AVH. Pt is pleasant and cooperative, less irritable. Affect is flat but brightens upon approach.  Pt  appears less anxious and she is interacting with peers and staff appropriately.  A: Pt was offered support and encouragement. Pt was given scheduled medications. Pt was encouraged to attend groups. Q 15 minute checks were done for safety. Patient's  room was changed from 322 to 302 because patient needs an oxygen flow meter in the room for her C-PAP machine. Patient was placed on a monitor after placed on the machine.  R:Pt did not attend group.Pt is taking medication. Pt receptive to treatment and safety maintained on unit.

## 2015-11-17 NOTE — Progress Notes (Addendum)
Patient was intrusive,irritable and stating that she does not want Dr.P as her doctor.Stated that she never felt like this ever before that is because of her medications.Refused to take risperdal.Patient denies suicidal and homicidal ideations.Patients demands about her medications are taken cared by Dr.P except her Valium.Attneded groups.Using walker for ambulation since patient feels "weak".Encouraged fluids.

## 2015-11-17 NOTE — BHH Group Notes (Signed)
[place] Group Notes:  (Nursing/MHT/Case Management/Adjunct)  Date:  11/17/2015  Time:  4:04 PM  Type of Therapy:  Psychoeducational Skills  Participation Level:  Active  Participation Quality:  Appropriate  Affect:  Appropriate  Cognitive:  Appropriate  Insight:  Appropriate  Engagement in Group:  Engaged  Modes of Intervention:  Discussion and Education  Summary of Progress/Problems:  Cynthia Deleon 11/17/2015, 4:04 PM

## 2015-11-17 NOTE — Progress Notes (Addendum)
[facility] MD Progress Note  11/17/2015 10:21 AM SUPRINA AREY  MRN:  [id number]  Subjective:  Ms. Gehling is a 60 year old female with a history of bipolar disorder admitted in a mixed, psychotic, dysphoric episode.  Today Ms. Caisse is still irritable, angry, intrusive, threatening to sue [place] but accepting medications. Yesterday she wanted to sign a 72 hour release form but failed to do so due to slight paranoia. She came to the hospital voluntarily but believes that she will receive better services at [facility] as she completely changed her mind and does not believe that she has mental illness but rather medical problems. This includes chronic pain for which she is treated by her neurosurgeon with Roxicodone 15 mg 3 times a day. I confirmed with her pharmacy that she has received her lithium on May 15. We continue her pain medication yet the patient complains of pain. I discontinued due to high narcotics. She ate very little for breakfast today as she feels "weak". Her blood pressure was low today but elevated yesterday. We encourage fluid intake.  Principal Problem: Bipolar I disorder, most recent episode mixed, severe with psychotic features ([place]) Diagnosis:   Patient Active Problem List   Diagnosis Date Noted  . OCD (obsessive compulsive disorder) [F42.9] 11/15/2015  . Tobacco use disorder [F17.200] 11/15/2015  . Bipolar affective disorder, mixed, severe ([place]) [F31.63] 11/15/2015  . Peripheral neuropathy ([place]) [G62.9] 10/10/2015  . Current use of estrogen therapy [Z79.899] 01/12/2014  . PTSD (post-traumatic stress disorder) [F43.10] 08/03/2011  . Bipolar I disorder, most recent episode mixed, severe with psychotic features ([place]) [F31.64]    Total Time spent with patient: 20 minutes  Past Psychiatric History: Bipolar disorder.  Past Medical History:  Past Medical History  Diagnosis Date  . Bipolar 1 disorder (HCC) Degenerative disk disease  . Arthritis     hands and knees   . Panic attacks   . Chronic back pain   . Incontinence   . Chronic neck pain   . Multiple personality disorder   . Depression   . Sleep apnea     uses a cpap-with oxygen  . Complication of anesthesia     high anxiety-does not want to be alone  . COPD (chronic obstructive pulmonary disease) ([place])   . Hot flashes 12/15/2013  . Peptic ulcer   . Rosacea   . Shingles   . IBS (irritable bowel syndrome)   . Current use of estrogen therapy 01/12/2014  . Kidney stone   . Hypertension   . Headache   . Peripheral neuropathy [facility])     Past Surgical History  Procedure Laterality Date  . Abdominal hysterectomy    . Tonsillectomy    . Foot arthrodesis  2000    both feet  . Bunionectomy with hammertoe reconstruction Right 12/10/2012    Procedure: RIGHT FIRST METATARSAL CHEVRON BUNION CORRECTION,  2 AND 3 HAMMERTOE CORRECTION , RIGHT 3 AND 4 TOE NAIL EXCISION ;  Surgeon: Wylene Simmer, MD;  Location: [place];  Service: Orthopedics;  Laterality: Right;  . Rectal surgery     Family History:  Family History  Problem Relation Age of Onset  . Diabetes Mother   . Other Mother     vertigo; chronic eye disease  . COPD Father   . COPD Sister   . Alcohol abuse Brother   . Diabetes Maternal Grandmother   . Diabetes Maternal Grandfather   . Diabetes Sister   . Other Sister  hearing problems  . Hyperlipidemia Sister   . Alcohol abuse Brother   . COPD Brother   . Alcohol abuse Brother   . Alcohol abuse Brother   . Other Brother     aneursym   Family Psychiatric  History: See H&P. Social History:  History  Alcohol Use No     History  Drug Use No    Social History   Social History  . Marital Status: Divorced    Spouse Name: N/A  . Number of Children: 5  . Years of Education: 9th   Occupational History  . Disabled    Social History Main Topics  . Smoking status: Former Smoker -- 0.50 packs/day for 35 years    Types: Cigarettes    Quit date: 10/19/2012  .  Smokeless tobacco: Never Used  . Alcohol Use: No  . Drug Use: No  . Sexual Activity: No     Comment: occ cigarette   Other Topics Concern  . None   Social History Narrative   Lives at home with home with her son (has five children).   Right-handed.   1 cup caffeine per day.   Additional Social History:                         Sleep: Fair  Appetite:  Poor  Current Medications: Current Facility-Administered Medications  Medication Dose Route Frequency Provider Last Rate Last Dose  . acetaminophen (TYLENOL) tablet 650 mg  650 mg Oral Q6H PRN Johna Kearl B Zadkiel Dragan, MD      . albuterol (PROVENTIL) (2.5 MG/3ML) 0.083% nebulizer solution 3 mL  3 mL Inhalation Q6H PRN Azzam Mehra B Chaden Doom, MD      . alum & mag hydroxide-simeth (MAALOX/MYLANTA) 200-200-20 MG/5ML suspension 30 mL  30 mL Oral Q4H PRN Alisi Lupien B Itzy Adler, MD      . estrogens (conjugated) (PREMARIN) tablet 0.3 mg  0.3 mg Oral Daily Gussie Towson B Laelyn Blumenthal, MD   0.3 mg at 11/17/15 0858  . magnesium hydroxide (MILK OF MAGNESIA) suspension 30 mL  30 mL Oral Daily PRN Kalee Broxton B Leshonda Galambos, MD      . nicotine (NICODERM CQ - dosed in mg/24 hours) patch 21 mg  21 mg Transdermal Q0600 Clovis Fredrickson, MD   21 mg at 11/16/15 0650  . Oxcarbazepine (TRILEPTAL) tablet 300 mg  300 mg Oral BID Gonzella Lex, MD   300 mg at 11/17/15 0858  . oxyCODONE (Oxy IR/ROXICODONE) immediate release tablet 15 mg  15 mg Oral TID PRN Gonzella Lex, MD   15 mg at 11/16/15 2157  . pantoprazole (PROTONIX) EC tablet 40 mg  40 mg Oral BID AC Gonzella Lex, MD   40 mg at 11/17/15 0857  . pravastatin (PRAVACHOL) tablet 40 mg  40 mg Oral q1800 Alayzha An B Avyon Herendeen, MD   40 mg at 11/16/15 1800  . risperiDONE (RISPERDAL) tablet 3 mg  3 mg Oral BID Clovis Fredrickson, MD   3 mg at 11/16/15 2157  . traZODone (DESYREL) tablet 300 mg  300 mg Oral QHS Melvine Julin B Dajanay Northrup, MD   300 mg at 11/16/15 2157    Lab Results:  Results for orders placed or  performed during the hospital encounter of 11/15/15 (from the past 48 hour(s))  Hemoglobin A1c     Status: Abnormal   Collection Time: 11/16/15  7:15 AM  Result Value Ref Range   Hgb A1c MFr Bld 6.4 (H) 4.0 - 6.0 %  Lipid panel, fasting     Status: Abnormal   Collection Time: 11/16/15  7:15 AM  Result Value Ref Range   Cholesterol 172 0 - 200 mg/dL   Triglycerides 240 (H) <150 mg/dL   HDL 42 >40 mg/dL   Total CHOL/HDL Ratio 4.1 RATIO   VLDL 48 (H) 0 - 40 mg/dL   LDL Cholesterol 82 0 - 99 mg/dL    Comment:        Total Cholesterol/HDL:CHD Risk Coronary Heart Disease Risk Table                     Men   Women  1/2 Average Risk   3.4   3.3  Average Risk       5.0   4.4  2 X Average Risk   9.6   7.1  3 X Average Risk  23.4   11.0        Use the calculated Patient Ratio above and the CHD Risk Table to determine the patient's CHD Risk.        ATP III CLASSIFICATION (LDL):  <100     mg/dL   Optimal  100-129  mg/dL   Near or Above                    Optimal  130-159  mg/dL   Borderline  160-189  mg/dL   High  >190     mg/dL   Very High   TSH     Status: None   Collection Time: 11/16/15  7:15 AM  Result Value Ref Range   TSH 0.873 0.350 - 4.500 uIU/mL  Prolactin     Status: Abnormal   Collection Time: 11/16/15  7:15 AM  Result Value Ref Range   Prolactin 51.9 (H) 4.8 - 23.3 ng/mL    Comment: (NOTE) Performed At: [facility], [place] [id number] Lindon Romp MD [id number]     Blood Alcohol level:  Lab Results  Component Value Date   [facility] <5 11/12/2015   ETH <11 11/02/2013    Physical Findings: AIMS: Facial and Oral Movements Muscles of Facial Expression: None, normal Lips and Perioral Area: None, normal Jaw: None, normal Tongue: None, normal,Extremity Movements Upper (arms, wrists, hands, fingers): None, normal Lower (legs, knees, ankles, toes): None, normal, Trunk Movements Neck, shoulders, hips: None, normal, Overall  Severity Severity of abnormal movements (highest score from questions above): None, normal Incapacitation due to abnormal movements: None, normal Patient's awareness of abnormal movements (rate only patient's report): No Awareness, Dental Status Current problems with teeth and/or dentures?: No Does patient usually wear dentures?: No  CIWA:  CIWA-Ar Total: 3 COWS:     Musculoskeletal: Strength & Muscle Tone: within normal limits Gait & Station: normal Patient leans: N/A  Psychiatric Specialty Exam: Physical Exam  Nursing note and vitals reviewed.   Review of Systems  Musculoskeletal: Positive for back pain.  Psychiatric/Behavioral: Positive for depression and hallucinations.  All other systems reviewed and are negative.   Blood pressure 96/69, pulse 86, temperature 98.1 F (36.7 C), resp. rate 18, height 5\' 9"  (1.753 m), weight 79.833 kg (176 lb), SpO2 95 %.Body mass index is 25.98 kg/(m^2).  General Appearance: Disheveled  Eye Contact:  Minimal  Speech:  Clear and Coherent  Volume:  Normal  Mood:  Angry, Dysphoric and Irritable  Affect:  Congruent  Thought Process:  Goal Directed  Orientation:  Full (Time, Place, and Person)  Thought Content:  Paranoid Ideation  Suicidal Thoughts:  No  Homicidal Thoughts:  No  Memory:  Immediate;   Fair Recent;   Fair Remote;   Fair  Judgement:  Poor  Insight:  Shallow  Psychomotor Activity:  Normal  Concentration:  Concentration: Fair and Attention Span: Fair  Recall:  [employer] of Knowledge:  Fair  Language:  Fair  Akathisia:  No  Handed:  Right  AIMS (if indicated):     Assets:  Communication Skills Desire for Improvement Financial Resources/Insurance Housing Resilience Social Support  ADL's:  Intact  Cognition:  WNL  Sleep:  Number of Hours: 7.5     Treatment Plan Summary: Daily contact with patient to assess and evaluate symptoms and progress in treatment and Medication management   Ms. Varma is a 60 year old  female with history of bipolar disorder, PTSD and chronic pain admitted in distress in the context of family problems.  1. Mood and psychosis. At the patient was restarted on Trileptal for mood stabilization and Risperdal was added for psychosis. She refuses Risperdal.  2. Insomnia. She is on trazodone 300 mg nightly.  3. COPD. She is on albuterol and Flonase.  4. Sleep apnea. She is on CPAP.  5. Smoking. Nicotine patch is available.  6. GERD. She is on Protonix.  7. Dyslipidemia. She is on Pravachol.   8. Chronic pain. She is on OxyCodone. I called her pharmacy her last prescriptions was given on May 15. She was negative for opiates on admission.  9. Benzodiazepine abuse. The patient has been maintained on Valiums. She was given Tranxene and [facility] emergency room. We will not prescribe benzodiazepines together with painkillers.  10. Constipation. The patient is on bowel regimen.  11. Metabolic syndromes screening. Lipid profile shows elevated triglycerides, TSH is normal, Hemoglobin A1c 6.4, Prolactin 51.9.  12. Disposition. She will be discharged to home with family. She will follow-up with [facility] in [place].  Orson Slick, MD 11/17/2015, 10:21 AM

## 2015-11-17 NOTE — Progress Notes (Signed)
Recreation Therapy Notes  Date: 06.09.17 Time: 9:30 am Location: Craft Room  Group Topic: Coping Skills  Goal Area(s) Addresses:  Patient will participate in coping skill. Patient will verbalize benefit of art as a coping skill.  Behavioral Response: Attentive, Interactive, Disruptive  Intervention: Coloring  Activity: Patients were given coloring sheets to color and were asked to think about the emotions they were feeling and what their mind was focused on.  Education: LRT educated patients on healthy coping skills.   Education Outcome: In group clarification offered  Clinical Observations/Feedback: Patient colored coloring sheet. Patient talked to peer about how she is frustrated that she did not know she could use the phones at 7 and she did not like her doctor. She wanted to know why she could not have a clock in her room. LRT attempted to redirect. Patient difficult to redirect. Patient talked during group discussion. LRT redirected patient and patient complied.  Leonette Monarch, LRT/CTRS 11/17/2015 10:23 AM
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
## 2015-11-18 MED ORDER — NAPHAZOLINE-GLYCERIN 0.012-0.2 % OP SOLN
1.0000 [drp] | Freq: Four times a day (QID) | OPHTHALMIC | Status: DC | PRN
  Administered 2015-11-20: 2 [drp] via OPHTHALMIC
  Administered 2015-11-22: 1 [drp] via OPHTHALMIC
  Filled 2015-11-18 (×3): qty 15

## 2015-11-18 MED ORDER — POLYETHYLENE GLYCOL 3350 17 G PO PACK: 17.0000 g | PACK | Freq: Every day | ORAL | Status: DC | PRN

## 2015-11-18 MED ORDER — ESOMEPRAZOLE MAGNESIUM 40 MG PO CPDR
40.0000 mg | DELAYED_RELEASE_CAPSULE | Freq: Once | ORAL | Status: AC
  Administered 2015-11-18: 40 mg via ORAL
  Filled 2015-11-18: qty 1

## 2015-11-18 MED ORDER — ESOMEPRAZOLE MAGNESIUM 40 MG PO CPDR
40.0000 mg | DELAYED_RELEASE_CAPSULE | Freq: Two times a day (BID) | ORAL | Status: DC
  Administered 2015-11-19 – 2015-11-23 (×8): 40 mg via ORAL
  Filled 2015-11-18 (×15): qty 1

## 2015-11-18 NOTE — Progress Notes (Signed)
Pt has been pleasant and cooperative the patient has exhibited some manic behaviors. Pt has been med compliant  And attending all unit activities. Pt denies SI and A/V hallucinations.

## 2015-11-18 NOTE — BHH Group Notes (Signed)
[facility] LCSW Group Therapy  11/18/2015 2:46 PM  Type of Therapy: Group Therapy  Participation Level:Active  Participation Quality:Monoploizing   Affect: Appropriate  Cognitive: Alert  Insight: Limited  Engagement in Therapy: Limited  Modes of Intervention: Discussion, Education, Socialization and Support  Summary of Progress/Problems:. Pt will identify unhealthy thoughts and how they impact their emotions and behavior. Pt will be encouraged to discuss these thoughts, emotions and behaviors with the group. Pt attended group and stayed the entire time. Pt was intrusive and monopolizing. CSW redirected her many times. She was preoccupied with her missing hair clip. She was upset about it going missing. She also listed many other complaints such as her medication list.     [place] MSW, LCSWA  11/18/2015, 2:46 PM
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
## 2015-11-18 NOTE — Progress Notes (Signed)
D: Pt intrusive, demanding, impatient and irritable. Constantly at RN station asking about medications and CPAP as well as other MD orders. Pt restless, pacing. Speech is pressured and rapid, argumentative. Denies SI/AVH. A: Encouragement and support provided. Q15 minute checks maintained for safety. Medications given as prescribed. R: Remains safe on unit. Voices no additional concerns at this time.

## 2015-11-18 NOTE — [facility] Group Notes (Signed)
[facility] Group Notes:  (Nursing/MHT/Case Management/Adjunct)  Date:  11/18/2015  Time:  5:17 AM  Type of Therapy:  Psychoeducational Skills  Participation Level:  Did Not Attend  Summary of Progress/Problems: She was asked to attend group but she declined. Reece Agar 11/18/2015, 5:17 AM

## 2015-11-19 MED ORDER — FLEET ENEMA 7-19 GM/118ML RE ENEM
1.0000 | ENEMA | Freq: Once | RECTAL | Status: AC
  Administered 2015-11-19: 1 via RECTAL

## 2015-11-19 MED ORDER — SENNA 8.6 MG PO TABS
1.0000 | ORAL_TABLET | Freq: Every day | ORAL | Status: DC
  Administered 2015-11-20 – 2015-11-21 (×2): 8.6 mg via ORAL
  Filled 2015-11-19 (×2): qty 1

## 2015-11-19 MED ORDER — VITAMIN B-12 1000 MCG PO TABS
1000.0000 ug | ORAL_TABLET | Freq: Every day | ORAL | Status: DC
  Administered 2015-11-19 – 2015-11-21 (×3): 1000 ug via ORAL
  Filled 2015-11-19 (×3): qty 1

## 2015-11-19 NOTE — [facility] Group Notes (Signed)
[facility] LCSW Group Therapy  11/19/2015 2:03 PM  Type of Therapy:  Group Therapy  Participation Level:  Active   Participation Quality:  Attentive  Affect:  Flat  Cognitive:  Alert  Insight:  Limited  Engagement in Therapy:  Limited  Modes of Intervention:  Discussion, Education, Socialization and Support  Summary of Progress/Problems: Self esteem: Patients discussed self esteem and how it impacts them. They discussed what aspects in their lives has influenced their self esteem. They were challenged to identify changes that are needed in order to improve self esteem.  Pt attended group and stayed the entire time. She was very focused about complaints against staff. She states her medications are not correct. She states "if you do what I ask, Ill be a great patient."   Wray Kearns MSW, LCSWA  11/19/2015, 2:03 PM

## 2015-11-19 NOTE — Progress Notes (Signed)
Pt has been pleasant and some what cooperative. Pt was given a fleets enema with fair results. Pt continues to be intrusive and hyper verbal. Pt denies SI and A/V hallucinations . Pt continues to be active on the unit interacting with both staff and peers appropriately.
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
## 2015-11-19 NOTE — [facility] Group Notes (Signed)
[facility] Group Notes:  (Nursing/MHT/Case Management/Adjunct)  Date:  11/19/2015  Time:  11:53 PM  Type of Therapy:  Group Therapy  Participation Level:  Active  Participation Quality:  Appropriate  Affect:  Appropriate  Cognitive:  Appropriate  Insight:  Good  Engagement in Group:  Developing/Improving and Supportive  Modes of Intervention:  n/a  Summary of Progress/Problems: Pt's main concern was trying to find transportation home. Staff made Pt aware that she may be able to catch the bus but she would have to talk to her social worker for further information.   Marylynn Pearson 11/19/2015, 11:53 PM

## 2015-11-19 NOTE — Progress Notes (Signed)
Pt complains of abdominal pain and bloating. Abdomen is distended. MD notified. Pt given PRN MOM for constipation. Pt is adamant about getting an order for Papaya supplements which she states " is the only thing that works for my bowel issues". MD notified. Pt affect is anxious. Intrusive and continues to complain about treatment at hospital as well as medications that are prescribed. Cooperative at med pass. Denies SI/AVH. Remains safe on unit. Will continue to monitor.

## 2015-11-19 NOTE — B[facility] Group Notes (Signed)
[facility] Group Notes:  (Nursing/MHT/Case Management/Adjunct)  Date:  11/19/2015  Time:  5:20 AM  Type of Therapy:  Psychoeducational Skills  Participation Level:  Active  Participation Quality:  Appropriate  Affect:  Appropriate  Cognitive:  Appropriate  Insight:  Appropriate  Engagement in Group:  Engaged  Modes of Intervention:  Discussion, Socialization and Support  Summary of Progress/Problems: Patient had to leave group early to get medication. Cynthia Deleon 11/19/2015, 5:20 AM

## 2015-11-20 MED ORDER — OXYBUTYNIN CHLORIDE ER 5 MG PO TB24
15.0000 mg | ORAL_TABLET | Freq: Every day | ORAL | Status: DC
  Administered 2015-11-20 – 2015-11-22 (×3): 15 mg via ORAL
  Filled 2015-11-20: qty 1
  Filled 2015-11-20 (×2): qty 3

## 2015-11-20 MED ORDER — OXYCODONE HCL 5 MG PO TABS
15.0000 mg | ORAL_TABLET | Freq: Every day | ORAL | Status: DC
  Administered 2015-11-20 – 2015-11-22 (×3): 15 mg via ORAL
  Filled 2015-11-20 (×3): qty 3

## 2015-11-20 MED ORDER — PRAZOSIN HCL 2 MG PO CAPS
2.0000 mg | ORAL_CAPSULE | Freq: Two times a day (BID) | ORAL | Status: DC
Start: 2015-11-20 — End: 2015-11-21
  Administered 2015-11-20 – 2015-11-21 (×3): 2 mg via ORAL
  Filled 2015-11-20 (×3): qty 1

## 2015-11-20 MED ORDER — ARIPIPRAZOLE 10 MG PO TABS
20.0000 mg | ORAL_TABLET | Freq: Every day | ORAL | Status: DC
Start: 2015-11-21 — End: 2015-11-21
  Administered 2015-11-21: 20 mg via ORAL
  Filled 2015-11-20: qty 2

## 2015-11-20 MED ORDER — DIAZEPAM 5 MG PO TABS
5.0000 mg | ORAL_TABLET | Freq: Three times a day (TID) | ORAL | Status: DC | PRN
  Administered 2015-11-20 – 2015-11-21 (×3): 5 mg via ORAL
  Filled 2015-11-20 (×3): qty 1

## 2015-11-20 NOTE — Progress Notes (Signed)
[facility] MD Progress Note  11/20/2015 9:53 AM Cynthia Deleon  MRN:  [id number]  Subjective:  Cynthia Deleon feeling somewhat better today. She has much more insight into her problem. She apologized to me and to the nurses for her behavior on a day of admission. She is still intrusive, very labile, crying at the drop of hat. Her thoughts are still racing and she is tangential. She takes medications as prescribed and seems to tolerate them well. She is asking for several medication changes. She requests pain medication at night only but in the interim she wants her valiums. She has not been given medication for over active bladder and wants to start the. She has been complaining of constipation bitterly spite of bowel regimen and an NMI yesterday. She would like to restart her papaya supplement.  Principal Problem: Bipolar I disorder, most recent episode mixed, severe with psychotic features ([place]) Diagnosis:   Patient Active Problem List   Diagnosis Date Noted  . OCD (obsessive compulsive disorder) [F42.9] 11/15/2015  . Tobacco use disorder [F17.200] 11/15/2015  . Bipolar affective disorder, mixed, severe ([place]) [F31.63] 11/15/2015  . Peripheral neuropathy ([place]) [G62.9] 10/10/2015  . Current use of estrogen therapy [Z79.899] 01/12/2014  . PTSD (post-traumatic stress disorder) [F43.10] 08/03/2011  . Bipolar I disorder, most recent episode mixed, severe with psychotic features ([place]) [F31.64]    Total Time spent with patient: 30 minutes  Past Psychiatric History: bipolar disorder, PTSD. Past Medical History:  Past Medical History  Diagnosis Date  . Bipolar 1 disorder (HCC) Degenerative disk disease  . Arthritis     hands and knees  . Panic attacks   . Chronic back pain   . Incontinence   . Chronic neck pain   . Multiple personality disorder   . Depression   . Sleep apnea     uses a cpap-with oxygen  . Complication of anesthesia     high anxiety-does not want to be alone  . COPD (chronic  obstructive pulmonary disease) ([place])   . Hot flashes 12/15/2013  . Peptic ulcer   . Rosacea   . Shingles   . IBS (irritable bowel syndrome)   . Current use of estrogen therapy 01/12/2014  . Kidney stone   . Hypertension   . Headache   . Peripheral neuropathy [facility])     Past Surgical History  Procedure Laterality Date  . Abdominal hysterectomy    . Tonsillectomy    . Foot arthrodesis  2000    both feet  . Bunionectomy with hammertoe reconstruction Right 12/10/2012    Procedure: RIGHT FIRST METATARSAL CHEVRON BUNION CORRECTION,  2 AND 3 HAMMERTOE CORRECTION , RIGHT 3 AND 4 TOE NAIL EXCISION ;  Surgeon: Wylene Simmer, MD;  Location: [place];  Service: Orthopedics;  Laterality: Right;  . Rectal surgery     Family History:  Family History  Problem Relation Age of Onset  . Diabetes Mother   . Other Mother     vertigo; chronic eye disease  . COPD Father   . COPD Sister   . Alcohol abuse Brother   . Diabetes Maternal Grandmother   . Diabetes Maternal Grandfather   . Diabetes Sister   . Other Sister     hearing problems  . Hyperlipidemia Sister   . Alcohol abuse Brother   . COPD Brother   . Alcohol abuse Brother   . Alcohol abuse Brother   . Other Brother     aneursym   Family Psychiatric  HistorSee H&P.Social History:  History  Alcohol Use No     History  Drug Use No    Social History   Social History  . Marital Status: Divorced    Spouse Name: N/A  . Number of Children: 5  . Years of Education: 9th   Occupational History  . Disabled    Social History Main Topics  . Smoking status: Former Smoker -- 0.50 packs/day for 35 years    Types: Cigarettes    Quit date: 10/19/2012  . Smokeless tobacco: Never Used  . Alcohol Use: No  . Drug Use: No  . Sexual Activity: No     Comment: occ cigarette   Other Topics Concern  . None   Social History Narrative   Lives at home with home with her son (has five children).   Right-handed.   1 cup caffeine per  day.   Additional Social History:                         Sleep: Fair  Appetite:  Fair  Current Medications: Current Facility-Administered Medications  Medication Dose Route Frequency Provider Last Rate Last Dose  . acetaminophen (TYLENOL) tablet 650 mg  650 mg Oral Q6H PRN Savi Lastinger B Evelynne Spiers, MD   650 mg at 11/18/15 1719  . albuterol (PROVENTIL HFA;VENTOLIN HFA) 108 (90 Base) MCG/ACT inhaler 2 puff  2 puff Inhalation Q4H PRN Raford Brissett B Megan Presti, MD      . alum & mag hydroxide-simeth (MAALOX/MYLANTA) 200-200-20 MG/5ML suspension 30 mL  30 mL Oral Q4H PRN Clovis Fredrickson, MD   30 mL at 11/17/15 2052  . ARIPiprazole (ABILIFY) tablet 15 mg  15 mg Oral Daily Clovis Fredrickson, MD   15 mg at 11/20/15 0933  . diazepam (VALIUM) tablet 5 mg  5 mg Oral Q8H PRN Gianne Shugars B Keyshaun Exley, MD      . docusate sodium (COLACE) capsule 100 mg  100 mg Oral BID Clovis Fredrickson, MD   100 mg at 11/19/15 2206  . esomeprazole (NEXIUM) capsule 40 mg  40 mg Oral BID Clovis Fredrickson, MD   40 mg at 11/20/15 0933  . estrogens (conjugated) (PREMARIN) tablet 0.3 mg  0.3 mg Oral Daily Satoya Feeley B Lorrinda Ramstad, MD   0.3 mg at 11/20/15 0935  . fluticasone (FLONASE) 50 MCG/ACT nasal spray 2 spray  2 spray Each Nare Daily Clovis Fredrickson, MD   2 spray at 11/20/15 0934  . magnesium hydroxide (MILK OF MAGNESIA) suspension 30 mL  30 mL Oral Daily PRN Larayne Baxley B Ardelle Haliburton, MD   30 mL at 11/18/15 2314  . naphazoline-glycerin (CLEAR EYES) ophth solution 1-2 drop  1-2 drop Both Eyes QID PRN Chauncey Mann, MD      . nicotine (NICODERM CQ - dosed in mg/24 hours) patch 21 mg  21 mg Transdermal Q0600 Clovis Fredrickson, MD   21 mg at 11/20/15 0936  . Oxcarbazepine (TRILEPTAL) tablet 300 mg  300 mg Oral BID Gonzella Lex, MD   300 mg at 11/20/15 0934  . oxybutynin (DITROPAN XL) 24 hr tablet 15 mg  15 mg Oral QHS Etienne Millward B Cleopha Indelicato, MD      . oxyCODONE (Oxy IR/ROXICODONE) immediate release tablet 15 mg   15 mg Oral QHS Lonie Rummell B Gabby Rackers, MD      . polyethylene glycol (MIRALAX / GLYCOLAX) packet 17 g  17 g Oral Daily Makinna Andy B Alleyah Twombly, MD   17 g at 11/20/15  0932  . pravastatin (PRAVACHOL) tablet 40 mg  40 mg Oral q1800 Tri Chittick B Eugene Zeiders, MD   40 mg at 11/19/15 1642  . prazosin (MINIPRESS) capsule 2 mg  2 mg Oral BID Aldean Suddeth B Mahati Vajda, MD      . senna (SENOKOT) tablet 8.6 mg  1 tablet Oral Daily Chauncey Mann, MD   8.6 mg at 11/20/15 0934  . traZODone (DESYREL) tablet 300 mg  300 mg Oral QHS Clovis Fredrickson, MD   300 mg at 11/19/15 2205  . vitamin B-12 (CYANOCOBALAMIN) tablet 1,000 mcg  1,000 mcg Oral Daily Chauncey Mann, MD   1,000 mcg at 11/20/15 [id number]    Lab Results: No results found for this or any previous visit (from the past 48 hour(s)).  Blood Alcohol level:  Lab Results  Component Value Date   [facility] <5 11/12/2015   ETH <11 11/02/2013    Physical Findings: AIMS: Facial and Oral Movements Muscles of Facial Expression: None, normal Lips and Perioral Area: None, normal Jaw: None, normal Tongue: None, normal,Extremity Movements Upper (arms, wrists, hands, fingers): None, normal Lower (legs, knees, ankles, toes): None, normal, Trunk Movements Neck, shoulders, hips: None, normal, Overall Severity Severity of abnormal movements (highest score from questions above): None, normal Incapacitation due to abnormal movements: None, normal Patient's awareness of abnormal movements (rate only patient's report): No Awareness, Dental Status Current problems with teeth and/or dentures?: No Does patient usually wear dentures?: No  CIWA:  CIWA-Ar Total: 3 COWS:     Musculoskeletal: Strength & Muscle Tone: within normal limits Gait & Station: normal Patient leans: N/A  Psychiatric Specialty Exam: Physical Exam  Nursing note and vitals reviewed.   Review of Systems  Gastrointestinal: Positive for constipation.  Genitourinary: Positive for frequency.  Musculoskeletal:  Positive for back pain.  Psychiatric/Behavioral: Positive for hallucinations.  All other systems reviewed and are negative.   Blood pressure 105/72, pulse 97, temperature 98.2 F (36.8 C), temperature source Oral, resp. rate 20, height 5\' 9"  (1.753 m), weight 79.833 kg (176 lb), SpO2 95 %.Body mass index is 25.98 kg/(m^2).  General Appearance: Fairly Groomed  Eye Contact:  Fair  Speech:  Clear and Coherent  Volume:  Normal  Mood:  Angry, Dysphoric and Irritable  Affect:  Labile and Tearful  Thought Process:  Disorganized  Orientation:  Full (Time, Place, and Person)  Thought Content:  Delusions and Paranoid Ideation  Suicidal Thoughts:  No  Homicidal Thoughts:  No  Memory:  Immediate;   Fair Recent;   Fair Remote;   Fair  Judgement:  Poor  Insight:  Shallow  Psychomotor Activity:  Normal  Concentration:  Concentration: Fair and Attention Span: Fair  Recall:  [employer] of Knowledge:  Fair  Language:  Fair  Akathisia:  No  Handed:  Right  AIMS (if indicated):     Assets:  Communication Skills Desire for Improvement Financial Resources/Insurance Housing Physical Health Resilience Social Support  ADL's:  Intact  Cognition:  WNL  Sleep:  Number of Hours: 7.3     Treatment Plan Summary: Daily contact with patient to assess and evaluate symptoms and progress in treatment and Medication management   Cynthia Deleon is a 60 year old female with history of bipolar disorder, PTSD and chronic pain admitted in distress in the context of family problems.  1. Mood and psychosis. At the patient was restarted on Trileptal for mood stabilization. Abilify was added for psychosis. I will increase Abilify to 20 mg dayly.  2. Insomnia.  She is on trazodone 300 mg nightly.  3. COPD. She is on albuterol and Flonase.  4. Sleep apnea. She is on CPAP.  5. Smoking. Nicotine patch is available.  6. GERD. She is on Nexium.   7. Dyslipidemia. She is on Pravachol.   8. Chronic pain. She  is on OxyCodone. We will limit it to once a day at bedtime.   9. PTSD. We will start Minipress 2 mg bid for nightmares and flashbacks and Valium for panic attacks.   10. Constipation. The patient is on bowel regimen.  11. Metabolic syndromes screening. Lipid profile shows elevated triglycerides, TSH is normal, Hemoglobin A1c 6.4, Prolactin 51.9.  12. Urinary incontinence. Will start Ditropan.  13. Dry eyes. She has eye drops available.   14. Disposition. She will be discharged to home with family. She will follow-up with [facility] in [place].  Orson Slick, MD 11/20/2015, 9:53 AM

## 2015-11-20 NOTE — Progress Notes (Signed)
Recreation Therapy Notes  Date: 06.12.17 Time: 9:30 am Location: Craft Room  Group Topic: Self-expression  Goal Area(s) Addresses:  Patient will effectively use art as a means of self-expression. Patient will be able to identify one emotion experienced during group session.  Behavioral Response: Did not attend  Intervention: Two Faces of Me  Activity: Patients were given a blank worksheet and instructed to draw a line down the middle. On one side of the face, patients were instructed to draw or write how they were feeling when they were admitted to the hospital. On the other side of the face, patients were instructed to draw or write how they want to feel when they are d/c.  Education: LRT educated group on different forms on self-expression.  Education Outcome: Patient did not attend group.  Clinical Observations/Feedback: Patient did not attend group.  Leonette Monarch, LRT/CTRS 11/20/2015 10:01 AM

## 2015-11-20 NOTE — Progress Notes (Signed)
Patient is pleasant,cooperative and intrusive at times.Patient involve in other patients needs.Verbalized that she feels better today.Denies suicidal or homicidal ideations.Compliant with medications.Attended groups.Appetitte and energy level good.
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
## 2015-11-20 NOTE — [facility] Group Notes (Signed)
[facility] LCSW Group Therapy  11/20/2015 10:38 AM  Type of Therapy:  Group Therapy  Participation Level:  Active  Participation Quality:  Attentive and Monopolizing  Affect:  Appropriate  Cognitive:  Appropriate  Insight:  Improving  Engagement in Therapy:  Improving  Modes of Intervention:  Orientation and Problem-solving  Summary of Progress/Problems:LCSW introduced group rules. LCSW and patients were asked to explore obstacles they face, as a collective group, health issues, unemployment, medications and sleep issues, and anger were reviewed and discussed. This patient was able to discuss helpful measures to reduce sleep and supported peers in the group.  Enis Slipper M 11/20/2015, 10:38 AM

## 2015-11-20 NOTE — Progress Notes (Signed)
Recreation Therapy Notes  Date: 06.12.17 Time: 1:00 pm Location: Craft Room  Group Topic: Wellness  Goal Area(s) Addresses:  Patient will identify at least one item per dimension of health. Patient will examine areas they are deficient in.  Behavioral Response: Attentive, Interactive  Intervention: 6 Dimension of Health  Activity: Patients were given a worksheet of the definitions of the 6 dimensions of health. Patients were given a worksheet of each dimension and instructed to write 2-3 things they were currently doing in each dimension.  Education: LRT educated group on how they can increase each dimension of health.  Education Outcome: Acknowledges education/In group clarification offered   Clinical Observations/Feedback: Patient completed activity by writing down things she was doing in most of the categories. Some categories, patient wrote why she was lacking. Patient was concerned over peer who did not have interpreter. LRT tried to redirect patient, patient difficult to redirect. Patient talked about wanting to learn Spanish and how she can do that. Patient contributed to group discussion by stating what areas she was giving enough attention to, and what areas she was not giving enough attention to.  Leonette Monarch, LRT/CTRS 11/20/2015 3:02 PM

## 2015-11-21 MED ORDER — ARIPIPRAZOLE 10 MG PO TABS: 30.0000 mg | ORAL_TABLET | Freq: Every day | ORAL | Status: DC

## 2015-11-21 MED ORDER — SENNA 8.6 MG PO TABS
1.0000 | ORAL_TABLET | Freq: Every day | ORAL | Status: DC
  Administered 2015-11-22 – 2015-11-23 (×2): 8.6 mg via ORAL
  Filled 2015-11-21 (×2): qty 1

## 2015-11-21 MED ORDER — POLYETHYLENE GLYCOL 3350 17 G PO PACK
17.0000 g | PACK | Freq: Every day | ORAL | Status: DC
  Administered 2015-11-22: 17 g via ORAL
  Filled 2015-11-21 (×2): qty 1

## 2015-11-21 MED ORDER — PRAZOSIN HCL 2 MG PO CAPS
2.0000 mg | ORAL_CAPSULE | Freq: Two times a day (BID) | ORAL | Status: DC
  Administered 2015-11-21 – 2015-11-23 (×4): 2 mg via ORAL
  Filled 2015-11-21 (×5): qty 1

## 2015-11-21 MED ORDER — VITAMIN B-12 1000 MCG PO TABS
1000.0000 ug | ORAL_TABLET | Freq: Every day | ORAL | Status: DC
  Administered 2015-11-22 – 2015-11-23 (×2): 1000 ug via ORAL
  Filled 2015-11-21 (×2): qty 1

## 2015-11-21 MED ORDER — OXCARBAZEPINE 300 MG PO TABS
300.0000 mg | ORAL_TABLET | Freq: Two times a day (BID) | ORAL | Status: DC
  Administered 2015-11-21 – 2015-11-23 (×4): 300 mg via ORAL
  Filled 2015-11-21 (×5): qty 1

## 2015-11-21 MED ORDER — ESTROGENS CONJUGATED 0.3 MG PO TABS
0.3000 mg | ORAL_TABLET | Freq: Every day | ORAL | Status: DC
  Administered 2015-11-22 – 2015-11-23 (×2): 0.3 mg via ORAL
  Filled 2015-11-21 (×2): qty 1

## 2015-11-21 MED ORDER — ARIPIPRAZOLE 10 MG PO TABS
30.0000 mg | ORAL_TABLET | Freq: Every day | ORAL | Status: DC
  Administered 2015-11-22 – 2015-11-23 (×2): 30 mg via ORAL
  Filled 2015-11-21 (×2): qty 3

## 2015-11-21 MED ORDER — FLUTICASONE PROPIONATE 50 MCG/ACT NA SUSP
2.0000 | Freq: Every day | NASAL | Status: DC
  Administered 2015-11-22 – 2015-11-23 (×2): 2 via NASAL
  Filled 2015-11-21: qty 16

## 2015-11-21 MED ORDER — DIAZEPAM 5 MG PO TABS
5.0000 mg | ORAL_TABLET | Freq: Three times a day (TID) | ORAL | Status: DC
  Administered 2015-11-22 – 2015-11-23 (×5): 5 mg via ORAL
  Filled 2015-11-21 (×5): qty 1

## 2015-11-21 MED ORDER — DOCUSATE SODIUM 100 MG PO CAPS
100.0000 mg | ORAL_CAPSULE | Freq: Two times a day (BID) | ORAL | Status: DC
  Administered 2015-11-21 – 2015-11-22 (×3): 100 mg via ORAL
  Filled 2015-11-21 (×4): qty 1

## 2015-11-21 NOTE — Progress Notes (Signed)
[facility] MD Progress Note  11/21/2015 3:03 PM Cynthia Deleon  MRN:  [id number]  Subjective:  Ms. because of his extremely labile, disorganized and paranoid. She Journalist, newspaper of being "racist". She is very suspicious of the baby monitor for this placed him on the night while she is using CPAP. This is down by the unit rules. She is been extremely intrusive and disruptive in group. She complains about pretty much everything beginning with food ending with the way staff member looked at her. She accepts medications and tolerates them well but continues complaining of constipation in spite of multiple laxatives. She would like to be allowed to take papaya pills.  Principal Problem: Bipolar I disorder, most recent episode mixed, severe with psychotic features ([place]) Diagnosis:   Patient Active Problem List   Diagnosis Date Noted  . OCD (obsessive compulsive disorder) [F42.9] 11/15/2015  . Tobacco use disorder [F17.200] 11/15/2015  . Bipolar affective disorder, mixed, severe ([place]) [F31.63] 11/15/2015  . Peripheral neuropathy ([place]) [G62.9] 10/10/2015  . Current use of estrogen therapy [Z79.899] 01/12/2014  . PTSD (post-traumatic stress disorder) [F43.10] 08/03/2011  . Bipolar I disorder, most recent episode mixed, severe with psychotic features ([place]) [F31.64]    Total Time spent with patient: 20 minutes  Past Psychiatric History: Bipolar disorder.  Past Medical History:  Past Medical History  Diagnosis Date  . Bipolar 1 disorder (HCC) Degenerative disk disease  . Arthritis     hands and knees  . Panic attacks   . Chronic back pain   . Incontinence   . Chronic neck pain   . Multiple personality disorder   . Depression   . Sleep apnea     uses a cpap-with oxygen  . Complication of anesthesia     high anxiety-does not want to be alone  . COPD (chronic obstructive pulmonary disease) ([place])   . Hot flashes 12/15/2013  . Peptic ulcer   . Rosacea   . Shingles   . IBS (irritable bowel  syndrome)   . Current use of estrogen therapy 01/12/2014  . Kidney stone   . Hypertension   . Headache   . Peripheral neuropathy [facility])     Past Surgical History  Procedure Laterality Date  . Abdominal hysterectomy    . Tonsillectomy    . Foot arthrodesis  2000    both feet  . Bunionectomy with hammertoe reconstruction Right 12/10/2012    Procedure: RIGHT FIRST METATARSAL CHEVRON BUNION CORRECTION,  2 AND 3 HAMMERTOE CORRECTION , RIGHT 3 AND 4 TOE NAIL EXCISION ;  Surgeon: Wylene Simmer, MD;  Location: [place];  Service: Orthopedics;  Laterality: Right;  . Rectal surgery     Family History:  Family History  Problem Relation Age of Onset  . Diabetes Mother   . Other Mother     vertigo; chronic eye disease  . COPD Father   . COPD Sister   . Alcohol abuse Brother   . Diabetes Maternal Grandmother   . Diabetes Maternal Grandfather   . Diabetes Sister   . Other Sister     hearing problems  . Hyperlipidemia Sister   . Alcohol abuse Brother   . COPD Brother   . Alcohol abuse Brother   . Alcohol abuse Brother   . Other Brother     aneursym   Family Psychiatric  History: See H&P. Social History:  History  Alcohol Use No     History  Drug Use No    Social History  Social History  . Marital Status: Divorced    Spouse Name: N/A  . Number of Children: 5  . Years of Education: 9th   Occupational History  . Disabled    Social History Main Topics  . Smoking status: Former Smoker -- 0.50 packs/day for 35 years    Types: Cigarettes    Quit date: 10/19/2012  . Smokeless tobacco: Never Used  . Alcohol Use: No  . Drug Use: No  . Sexual Activity: No     Comment: occ cigarette   Other Topics Concern  . None   Social History Narrative   Lives at home with home with her son (has five children).   Right-handed.   1 cup caffeine per day.   Additional Social History:                         Sleep: Fair  Appetite:  Fair  Current  Medications: Current Facility-Administered Medications  Medication Dose Route Frequency Provider Last Rate Last Dose  . acetaminophen (TYLENOL) tablet 650 mg  650 mg Oral Q6H PRN Clovis Fredrickson, MD   650 mg at 11/21/15 0459  . albuterol (PROVENTIL HFA;VENTOLIN HFA) 108 (90 Base) MCG/ACT inhaler 2 puff  2 puff Inhalation Q4H PRN Carroll Lingelbach B Aasia Peavler, MD      . alum & mag hydroxide-simeth (MAALOX/MYLANTA) 200-200-20 MG/5ML suspension 30 mL  30 mL Oral Q4H PRN Clovis Fredrickson, MD   30 mL at 11/17/15 2052  . ARIPiprazole (ABILIFY) tablet 20 mg  20 mg Oral Daily Clovis Fredrickson, MD   20 mg at 11/21/15 0937  . diazepam (VALIUM) tablet 5 mg  5 mg Oral Q8H PRN Clovis Fredrickson, MD   5 mg at 11/21/15 0937  . docusate sodium (COLACE) capsule 100 mg  100 mg Oral BID Clovis Fredrickson, MD   100 mg at 11/21/15 0937  . esomeprazole (NEXIUM) capsule 40 mg  40 mg Oral BID Sahira Cataldi B Amberley Hamler, MD   40 mg at 11/21/15 0830  . estrogens (conjugated) (PREMARIN) tablet 0.3 mg  0.3 mg Oral Daily Chizaram Latino B Lorann Tani, MD   0.3 mg at 11/21/15 0937  . fluticasone (FLONASE) 50 MCG/ACT nasal spray 2 spray  2 spray Each Nare Daily Clovis Fredrickson, MD   2 spray at 11/21/15 0940  . magnesium hydroxide (MILK OF MAGNESIA) suspension 30 mL  30 mL Oral Daily PRN Nessie Nong B Exander Shaul, MD   30 mL at 11/18/15 2314  . naphazoline-glycerin (CLEAR EYES) ophth solution 1-2 drop  1-2 drop Both Eyes QID PRN Chauncey Mann, MD   2 drop at 11/20/15 1513  . nicotine (NICODERM CQ - dosed in mg/24 hours) patch 21 mg  21 mg Transdermal Q0600 Clovis Fredrickson, MD   21 mg at 11/21/15 0641  . Oxcarbazepine (TRILEPTAL) tablet 300 mg  300 mg Oral BID Gonzella Lex, MD   300 mg at 11/21/15 0937  . oxybutynin (DITROPAN-XL) 24 hr tablet 15 mg  15 mg Oral QHS Clovis Fredrickson, MD   15 mg at 11/20/15 2126  . oxyCODONE (Oxy IR/ROXICODONE) immediate release tablet 15 mg  15 mg Oral QHS Clovis Fredrickson, MD   15 mg at  11/20/15 2127  . polyethylene glycol (MIRALAX / GLYCOLAX) packet 17 g  17 g Oral Daily Clovis Fredrickson, MD   17 g at 11/21/15 0937  . pravastatin (PRAVACHOL) tablet 40 mg  40 mg Oral q1800 Josseline Reddin  Vevelyn Francois, MD   40 mg at 11/20/15 1811  . prazosin (MINIPRESS) capsule 2 mg  2 mg Oral BID Clovis Fredrickson, MD   2 mg at 11/21/15 0937  . senna (SENOKOT) tablet 8.6 mg  1 tablet Oral Daily Chauncey Mann, MD   8.6 mg at 11/21/15 [id number]  . traZODone (DESYREL) tablet 300 mg  300 mg Oral QHS Clovis Fredrickson, MD   300 mg at 11/20/15 2127  . vitamin B-12 (CYANOCOBALAMIN) tablet 1,000 mcg  1,000 mcg Oral Daily Chauncey Mann, MD   1,000 mcg at 11/21/15 [id number]    Lab Results: No results found for this or any previous visit (from the past 48 hour(s)).  Blood Alcohol level:  Lab Results  Component Value Date   [facility] <5 11/12/2015   ETH <11 11/02/2013    Physical Findings: AIMS: Facial and Oral Movements Muscles of Facial Expression: None, normal Lips and Perioral Area: None, normal Jaw: None, normal Tongue: None, normal,Extremity Movements Upper (arms, wrists, hands, fingers): None, normal Lower (legs, knees, ankles, toes): None, normal, Trunk Movements Neck, shoulders, hips: None, normal, Overall Severity Severity of abnormal movements (highest score from questions above): None, normal Incapacitation due to abnormal movements: None, normal Patient's awareness of abnormal movements (rate only patient's report): No Awareness, Dental Status Current problems with teeth and/or dentures?: No Does patient usually wear dentures?: No  CIWA:  CIWA-Ar Total: 3 COWS:     Musculoskeletal: Strength & Muscle Tone: within normal limits Gait & Station: normal Patient leans: N/A  Psychiatric Specialty Exam: Physical Exam  Nursing note and vitals reviewed.   Review of Systems  Musculoskeletal: Positive for back pain.  Psychiatric/Behavioral: Positive for depression and hallucinations. The  patient is nervous/anxious.   All other systems reviewed and are negative.   Blood pressure 136/76, pulse 81, temperature 97.8 F (36.6 C), temperature source Oral, resp. rate 18, height 5\' 9"  (1.753 m), weight 79.833 kg (176 lb), SpO2 95 %.Body mass index is 25.98 kg/(m^2).  General Appearance: Fairly Groomed  Eye Contact:  Good  Speech:  Clear and Coherent  Volume:  Normal  Mood:  Angry, Dysphoric and Irritable  Affect:  Labile and Tearful  Thought Process:  Disorganized  Orientation:  Full (Time, Place, and Person)  Thought Content:  Delusions and Paranoid Ideation  Suicidal Thoughts:  No  Homicidal Thoughts:  No  Memory:  Immediate;   Fair Recent;   Fair Remote;   Fair  Judgement:  Poor  Insight:  Shallow  Psychomotor Activity:  Increased  Concentration:  Concentration: Poor and Attention Span: Poor  Recall:  Poor  Fund of Knowledge:  Poor  Language:  Fair  Akathisia:  No  Handed:  Right  AIMS (if indicated):     Assets:  Communication Skills Desire for Improvement Financial Resources/Insurance Housing Physical Health Resilience Social Support  ADL's:  Intact  Cognition:  WNL  Sleep:  Number of Hours: 6     Treatment Plan Summary: Daily contact with patient to assess and evaluate symptoms and progress in treatment and Medication management   Cynthia Deleon is a 60 year old female with history of bipolar disorder, PTSD and chronic pain admitted in distress in the context of family problems.  1. Mood and psychosis. At the patient was restarted on Trileptal for mood stabilization. Abilify was added for psychosis. I will increase Abilify to 30 mg daily.  2. Insomnia. She is on trazodone 300 mg nightly.  3. COPD. She is on albuterol  and Flonase.  4. Sleep apnea. She is on CPAP.  5. Smoking. Nicotine patch is available.  6. GERD. She is on Nexium.   7. Dyslipidemia. She is on Pravachol.   8. Chronic pain. She is on OxyCodone. We will limit it to once a day at  bedtime.   9. PTSD. We will start Minipress 2 mg bid for nightmares and flashbacks and Valium for panic attacks.   10. Constipation. The patient is on bowel regimen.  11. Metabolic syndromes screening. Lipid profile shows elevated triglycerides, TSH is normal, Hemoglobin A1c 6.4, Prolactin 51.9.  12. Urinary incontinence. Will start Ditropan.  13. Dry eyes. She has eye drops available.   14. Disposition. She will be discharged to home with family. She will follow-up with [facility] in [place].  Orson Slick, MD 11/21/2015, 3:03 PM

## 2015-11-21 NOTE — BHH Group Notes (Signed)
[place] Group Notes:  (Nursing/MHT/Case Management/Adjunct)  Date:  11/21/2015  Time:  10:25 PM  Type of Therapy:  Group Therapy  Participation Level:  Active  Participation Quality:  Appropriate  Affect:  Appropriate  Cognitive:  Appropriate  Insight:  Good  Engagement in Group:  Engaged  Modes of Intervention:  Discussion  Summary of Progress/Problems:  Kandis Fantasia 11/21/2015, 10:25 PM

## 2015-11-21 NOTE — Progress Notes (Signed)
D:Patient has had a rocky shift . Patient accused staff of being persecutory towards her . All events today have been centered around staff being mean towards patient . Patient shows no insight into her  behavior  Periods of tearfulness , voice of wanting to yell out or strike out at staff. Patient has had her robe on the entire shift .Noted to take her robe and  Wipe the floor and then open the door with it. Hyper- verbale . Intrusive  With peers on the floor giving advice on how they should proceed with their plan of care and to include her in there plan  Patient stated slept good last night .Stated appetite is poor because again they are out to get her so they have sent her hard fruit. `and energy level  Is normal. Stated concentration is good . Stated on Depression scale 0 but feels her feeling are being hurt  , hopeless 0 and anxiety 0.( low 0-10 high) Denies suicidal  homicidal ideations  .  No auditory hallucinations  No pain concerns . Appropriate ADL'S. Interacting with peers and staff.  A: Encourage patient participation with unit programming . Instruction  Given on  Medication , verbalize understanding. R: Voice no other concerns. Staff continue to monitor

## 2015-11-21 NOTE — Plan of Care (Signed)
Problem: Safety: Goal: Ability to remain free from injury will improve Outcome: Progressing Pt remains free from harm.  Problem: Self-Concept: Goal: Level of anxiety will decrease Outcome: Not Progressing Pt reports anxiety rated 6/10.

## 2015-11-21 NOTE — Progress Notes (Signed)
D: Pt affect is anxious this evening. She continues to be intrusive at times regarding other patient's care. Pt approaches nursing staff irritated with the color of her socks. Pt educated on colors of socks provided on the unit. Pt rates anxiety 6/10 and depression 0/10. Denies SI/HI/AVH at this time. She rates pain 9/10 and refuses PRN medication. Pt sleeps with CPAP. A: Emotional support and encouragement provided. Medications administered with education. q15 minute safety checks maintained. R: Pt remains free from harm. Will continue to monitor.

## 2015-11-21 NOTE — BHH Group Notes (Signed)
[facility] LCSW Group Therapy  11/21/2015 3:01 PM  Type of Therapy:  Group Therapy  Participation Level:  Active  Participation Quality:  Appropriate  Affect:  Appropriate  Cognitive:  Appropriate  Insight:  Developing/Improving  Engagement in Therapy:  Distracting  Modes of Intervention:  Discussion, Education, Exploration and Support  Summary of Progress/Problems: LCSW reviewed group rules and had patients introduce themselves and relay what emotions they struggle with. As a collective group patient reported anger, sadness, grief, loss and happiness. Each group member was asked to reflect on their own strongest emotion and then peers would contribute positive ways to reduce/deal their emotional issues. This patient needed gently redirection and expressed anger as an emotion she wished should could regulate. Some peers were helpful and she as well was supportive towards her peers.  [place] 11/21/2015, 3:01 PM

## 2015-11-21 NOTE — Progress Notes (Signed)
Recreation Therapy Notes  Date: 06.13.17 Time: 9:30 am Location: Craft Room  Group Topic: Goal Setting  Goal Area(s) Addresses:  Patient will write at least one goal. Patient will write at least one obstacle.  Behavioral Response: Attentive, Interactive, Disruptive  Intervention: Recovery Goal Chart  Activity: Patients were instructed to make a Recovery Goal Chart including goals, obstacles, date they started working on their goal, and date they achieved their goal.  Education: LRT educated patients on ways they can celebrate reaching their goals.  Education Outcome: Acknowledges education/In group clarification offered  Clinical Observations/Feedback: Patient completed activity by writing goals and obstacles. Patient would get off topic when LRT was ending group. LRT attempted to redirect patient. Patient difficult to redirect.  Leonette Monarch, LRT/CTRS 11/21/2015 10:16 AM

## 2015-11-21 NOTE — Plan of Care (Signed)
Problem: Education: Goal: Ability to state activities that reduce stress will improve Outcome: Not Progressing Pt unable to identify coping skills to reduce stress and anxiety.

## 2015-11-21 NOTE — BHH Group Notes (Signed)
[facility] Group Notes:  (Nursing/MHT/Case Management/Adjunct)  Date:  11/21/2015  Time:  5:16 AM  Type of Therapy:  Psychoeducational Skills  Participation Level:  Active  Participation Quality:  Appropriate  Affect:  Appropriate  Cognitive:  Appropriate  Insight:  Appropriate and Good  Engagement in Group:  Engaged and Supportive  Modes of Intervention:  Discussion, Socialization and Support   Summary of Progress/Problems: Patient had great insight on goals and was very supportive towards other patients. Cynthia Deleon 11/21/2015, 5:16 AM

## 2015-11-21 NOTE — BHH Group Notes (Signed)
[facility] LCSW Aftercare Discharge Planning Group Note  11/21/2015 10:30 AM  Participation Quality:  Appropriate  Affect:  Appropriate  Cognitive:  Alert  Insight:  Distracting  Engagement in Group:  Distracting  Modes of Intervention:  Discussion, Education, Exploration and Support  Summary of Progress/Problems:LCSW reviewed SMART goals with patients and explained the discharge process. This patients goal for today was concerning. Patient stated she wants to address the discrimination she feels as being white. This worker provided 1-1 support for patient. She has agreed to talk to her doctor. Patient needed redirection several times in group today.  Pauletta Browns, Adira Limburg M 11/21/2015, 10:30 AM

## 2015-11-21 NOTE — Plan of Care (Signed)
Problem: Coping: Goal: Ability to identify and develop effective coping behavior will improve Outcome: Not Progressing Tearful , and blaming throughout shift,  No insight

## 2015-11-21 NOTE — Progress Notes (Signed)
Pt reports that she did have a bowel movement last night.

## 2015-11-21 NOTE — BHH Group Notes (Signed)
[facility] Group Notes:  (Nursing/MHT/Case Management/Adjunct)  Date:  11/21/2015  Time:  2:01 PM  Type of Therapy:  Psychoeducational Skills  Participation Level:  Active  Participation Quality:  Monopolizing  Affect:  Blunted  Cognitive:  Alert  Insight:  Lacking  Engagement in Group:  Engaged  Modes of Intervention:  Discussion and Education  Summary of Progress/Problems:  Cynthia Deleon 11/21/2015, 2:01 PM

## 2015-11-22 ENCOUNTER — Telehealth: Payer: Self-pay | Admitting: *Deleted

## 2015-11-22 ENCOUNTER — Encounter: Payer: Medicaid Other | Admitting: Neurology

## 2015-11-22 MED ORDER — ARIPIPRAZOLE 30 MG PO TABS: 30.0000 mg | ORAL_TABLET | Freq: Every day | ORAL | Status: DC

## 2015-11-22 MED ORDER — CYANOCOBALAMIN 1000 MCG PO TABS: 1000.0000 ug | ORAL_TABLET | Freq: Every day | ORAL | Status: DC

## 2015-11-22 MED ORDER — PRAZOSIN HCL 2 MG PO CAPS: 2.0000 mg | ORAL_CAPSULE | Freq: Two times a day (BID) | ORAL | Status: DC

## 2015-11-22 MED ORDER — OXCARBAZEPINE 300 MG PO TABS: 300.0000 mg | ORAL_TABLET | Freq: Two times a day (BID) | ORAL | Status: DC

## 2015-11-22 NOTE — Progress Notes (Signed)
Recreation Therapy Notes  Date: 06.14.17 Time: 9:30 am Location: Craft Room  Group Topic: Self-esteem  Goal Area(s) Addresses:  Patient will identify at least one positive trait about self. Patient will identify at least one healthy coping skill.  Behavioral Response: Attentive, Interactive, Disruptive  Intervention: All About Me  Activity: Patients were instructed to make a pamphlet including their life's motto, positive traits, healthy coping skills, and their support system.  Education: LRT educated patients on ways they can increase their self-esteem.  Education Outcome: Acknowledges education/In group clarification offered  Clinical Observations/Feedback: Patient completed activity by writing positive traits, coping skills, and her support system. Patient did not contribute to group discussion. Patient would get off topic. LRT would try to redirect patient. Patient difficult to redirect.  Leonette Monarch, LRT/CTRS 11/22/2015 1:13 PM

## 2015-11-22 NOTE — Progress Notes (Signed)
D: Pt is slightly irritable this shift. Pt accuses staff of "not taking care of me and giving me what I need." Pt refuses to elaborate and asks staff to leave room. She continues to be intrusive with other pts on the floor and requires redirection. Denies SI/HI/AVH at this time. Denies pain. Pt uses CPAP at night. A: Emotional support and encouragement provided. Medications administered with education. q15 minute safety checks maintained. R: Pt remains free from harm. Will continue to monitor.

## 2015-11-22 NOTE — BHH Group Notes (Signed)
[facility] LCSW Group Therapy  11/22/2015 2:38 PM  Type of Therapy:  Group Therapy  Participation Level:  Active  Participation Quality:  Intrusive  Affect:  Labile  Cognitive:  Alert  Insight:  Limited  Engagement in Therapy:  Limited  Modes of Intervention:  Discussion, Education, Socialization and Support  Summary of Progress/Problems: Emotional Regulation: Patients will identify both negative and positive emotions. They will discuss emotions they have difficulty regulating and how they impact their lives. Patients will be asked to identify healthy coping skills to combat unhealthy reactions to negative emotions.  Pt attended group and stayed the entire time. She states she does not feel safe on the unit.     [place] MSW, [place]  11/22/2015, 2:38 PM

## 2015-11-22 NOTE — Progress Notes (Signed)
D: Patient stated slept poor last night .Stated appetite ispoor and energy level  Low . Stated concentration poor . Stated on Depression scale 10, hopeless10 and anxiety 0 .( low 0-10 high) Denies suicidal  homicidal ideations  .  No auditory hallucinations  No pain concerns . Appropriate ADL'S. Interacting with peers and staff.  Patient remains  Loud and intrusive  With staff and peers . Limited insight into behavior. Continue to focus on herself and how every one is  picking on her . Obsessing about going into the court yard and pulling grass A: Encourage patient participation with unit programming . Instruction  Given on  Medication , verbalize understanding.  Informed patient   She cannot  Go into court yard to pull  grass R: Voice no other concerns. Staff continue to monitor

## 2015-11-22 NOTE — BHH Group Notes (Signed)
[facility] Group Notes:  (Nursing/MHT/Case Management/Adjunct)  Date:  11/22/2015  Time:  5:07 PM  Type of Therapy:  Psychoeducational Skills  Participation Level:  Active  Participation Quality:  Monopolizing  Affect:  Blunted  Cognitive:  Disorganized  Insight:  Lacking  Engagement in Group:  Engaged  Modes of Intervention:  Discussion and Education  Summary of Progress/Problems:  Drake Leach 11/22/2015, 5:07 PM

## 2015-11-22 NOTE — Progress Notes (Signed)
[facility] MD Progress Note  11/22/2015 12:57 PM Cynthia Deleon  MRN:  [id number]  Subjective:  Ms. because on remains disruptive, intrusive, suspicious, and disorganized. She feels that staff members and peers are out to get her. She is unable to participate in programming. She has been complaining about everything and feels mistreated by staff members. Luckily she had a bowel movement yesterday. This has been a problem and complained all along. The patient takes papaya feels at home but they are not available in the hospital even from her own supply as they are not FDA approved. The patient at times is incontinent of stool. This is unclear if it is related to chronic constipation brought about most likely by opioid use.  Principal Problem: Bipolar I disorder, most recent episode mixed, severe with psychotic features ([place]) Diagnosis:   Patient Active Problem List   Diagnosis Date Noted  . OCD (obsessive compulsive disorder) [F42.9] 11/15/2015  . Tobacco use disorder [F17.200] 11/15/2015  . Bipolar affective disorder, mixed, severe ([place]) [F31.63] 11/15/2015  . Peripheral neuropathy ([place]) [G62.9] 10/10/2015  . Current use of estrogen therapy [Z79.899] 01/12/2014  . PTSD (post-traumatic stress disorder) [F43.10] 08/03/2011  . Bipolar I disorder, most recent episode mixed, severe with psychotic features ([place]) [F31.64]    Total Time spent with patient: 20 minutes  Past Psychiatric History: Bipolar disorder.  Past Medical History:  Past Medical History  Diagnosis Date  . Bipolar 1 disorder (HCC) Degenerative disk disease  . Arthritis     hands and knees  . Panic attacks   . Chronic back pain   . Incontinence   . Chronic neck pain   . Multiple personality disorder   . Depression   . Sleep apnea     uses a cpap-with oxygen  . Complication of anesthesia     high anxiety-does not want to be alone  . COPD (chronic obstructive pulmonary disease) ([place])   . Hot flashes 12/15/2013  . Peptic ulcer    . Rosacea   . Shingles   . IBS (irritable bowel syndrome)   . Current use of estrogen therapy 01/12/2014  . Kidney stone   . Hypertension   . Headache   . Peripheral neuropathy [facility])     Past Surgical History  Procedure Laterality Date  . Abdominal hysterectomy    . Tonsillectomy    . Foot arthrodesis  2000    both feet  . Bunionectomy with hammertoe reconstruction Right 12/10/2012    Procedure: RIGHT FIRST METATARSAL CHEVRON BUNION CORRECTION,  2 AND 3 HAMMERTOE CORRECTION , RIGHT 3 AND 4 TOE NAIL EXCISION ;  Surgeon: Wylene Simmer, MD;  Location: [place];  Service: Orthopedics;  Laterality: Right;  . Rectal surgery     Family History:  Family History  Problem Relation Age of Onset  . Diabetes Mother   . Other Mother     vertigo; chronic eye disease  . COPD Father   . COPD Sister   . Alcohol abuse Brother   . Diabetes Maternal Grandmother   . Diabetes Maternal Grandfather   . Diabetes Sister   . Other Sister     hearing problems  . Hyperlipidemia Sister   . Alcohol abuse Brother   . COPD Brother   . Alcohol abuse Brother   . Alcohol abuse Brother   . Other Brother     aneursym   Family Psychiatric  History: See H&P. Social History:  History  Alcohol Use No  History  Drug Use No    Social History   Social History  . Marital Status: Divorced    Spouse Name: N/A  . Number of Children: 5  . Years of Education: 9th   Occupational History  . Disabled    Social History Main Topics  . Smoking status: Former Smoker -- 0.50 packs/day for 35 years    Types: Cigarettes    Quit date: 10/19/2012  . Smokeless tobacco: Never Used  . Alcohol Use: No  . Drug Use: No  . Sexual Activity: No     Comment: occ cigarette   Other Topics Concern  . None   Social History Narrative   Lives at home with home with her son (has five children).   Right-handed.   1 cup caffeine per day.   Additional Social History:                          Sleep: Poor  Appetite:  Fair  Current Medications: Current Facility-Administered Medications  Medication Dose Route Frequency Provider Last Rate Last Dose  . acetaminophen (TYLENOL) tablet 650 mg  650 mg Oral Q6H PRN Clovis Fredrickson, MD   650 mg at 11/21/15 0459  . albuterol (PROVENTIL HFA;VENTOLIN HFA) 108 (90 Base) MCG/ACT inhaler 2 puff  2 puff Inhalation Q4H PRN Yovanna Cogan B Senovia Gauer, MD      . alum & mag hydroxide-simeth (MAALOX/MYLANTA) 200-200-20 MG/5ML suspension 30 mL  30 mL Oral Q4H PRN Clovis Fredrickson, MD   30 mL at 11/17/15 2052  . ARIPiprazole (ABILIFY) tablet 30 mg  30 mg Oral Q breakfast Clovis Fredrickson, MD   30 mg at 11/22/15 0838  . diazepam (VALIUM) tablet 5 mg  5 mg Oral TID AC Areliz Rothman B Taji Sather, MD   5 mg at 11/22/15 1228  . docusate sodium (COLACE) capsule 100 mg  100 mg Oral BID AC & HS Ane Conerly B Katalia Choma, MD   100 mg at 11/22/15 0646  . esomeprazole (NEXIUM) capsule 40 mg  40 mg Oral BID Clovis Fredrickson, MD   40 mg at 11/22/15 0854  . estrogens (conjugated) (PREMARIN) tablet 0.3 mg  0.3 mg Oral Q breakfast Tailor Lucking B Kaileb Monsanto, MD   0.3 mg at 11/22/15 0839  . fluticasone (FLONASE) 50 MCG/ACT nasal spray 2 spray  2 spray Each Nare Q breakfast Clovis Fredrickson, MD   2 spray at 11/22/15 0838  . magnesium hydroxide (MILK OF MAGNESIA) suspension 30 mL  30 mL Oral Daily PRN Talita Recht B Velma Agnes, MD   30 mL at 11/18/15 2314  . naphazoline-glycerin (CLEAR EYES) ophth solution 1-2 drop  1-2 drop Both Eyes QID PRN Chauncey Mann, MD   1 drop at 11/22/15 0644  . nicotine (NICODERM CQ - dosed in mg/24 hours) patch 21 mg  21 mg Transdermal Q0600 Haim Hansson B Donelle Baba, MD   21 mg at 11/22/15 0600  . Oxcarbazepine (TRILEPTAL) tablet 300 mg  300 mg Oral BID AC & HS Brylyn Novakovich B Laurel Harnden, MD   300 mg at 11/22/15 0646  . oxybutynin (DITROPAN-XL) 24 hr tablet 15 mg  15 mg Oral QHS Clovis Fredrickson, MD   15 mg at 11/21/15 2151  . oxyCODONE (Oxy IR/ROXICODONE)  immediate release tablet 15 mg  15 mg Oral QHS Clovis Fredrickson, MD   15 mg at 11/21/15 2151  . polyethylene glycol (MIRALAX / GLYCOLAX) packet 17 g  17 g Oral Q breakfast Shanin Szymanowski B  Dabney Schanz, MD   17 g at 11/22/15 0839  . pravastatin (PRAVACHOL) tablet 40 mg  40 mg Oral q1800 Malic Rosten B Angelice Piech, MD   40 mg at 11/21/15 1743  . prazosin (MINIPRESS) capsule 2 mg  2 mg Oral BID AC & HS Theresea Trautmann B Bernie Ransford, MD   2 mg at 11/22/15 0646  . senna (SENOKOT) tablet 8.6 mg  1 tablet Oral Q breakfast Joline Encalada B Evolet Salminen, MD   8.6 mg at 11/22/15 0839  . traZODone (DESYREL) tablet 300 mg  300 mg Oral QHS Clovis Fredrickson, MD   300 mg at 11/21/15 2150  . vitamin B-12 (CYANOCOBALAMIN) tablet 1,000 mcg  1,000 mcg Oral Q breakfast Clovis Fredrickson, MD   1,000 mcg at 11/22/15 0840    Lab Results: No results found for this or any previous visit (from the past 48 hour(s)).  Blood Alcohol level:  Lab Results  Component Value Date   [facility] <5 11/12/2015   ETH <11 11/02/2013    Physical Findings: AIMS: Facial and Oral Movements Muscles of Facial Expression: None, normal Lips and Perioral Area: None, normal Jaw: None, normal Tongue: None, normal,Extremity Movements Upper (arms, wrists, hands, fingers): None, normal Lower (legs, knees, ankles, toes): None, normal, Trunk Movements Neck, shoulders, hips: None, normal, Overall Severity Severity of abnormal movements (highest score from questions above): None, normal Incapacitation due to abnormal movements: None, normal Patient's awareness of abnormal movements (rate only patient's report): No Awareness, Dental Status Current problems with teeth and/or dentures?: No Does patient usually wear dentures?: No  CIWA:  CIWA-Ar Total: 3 COWS:     Musculoskeletal: Strength & Muscle Tone: within normal limits Gait & Station: normal Patient leans: N/A  Psychiatric Specialty Exam: Physical Exam  Nursing note and vitals reviewed.   Review of Systems   Psychiatric/Behavioral: Positive for hallucinations. The patient is nervous/anxious and has insomnia.   All other systems reviewed and are negative.   Blood pressure 132/62, pulse 89, temperature 98.1 F (36.7 C), temperature source Oral, resp. rate 18, height 5\' 9"  (1.753 m), weight 79.833 kg (176 lb), SpO2 95 %.Body mass index is 25.98 kg/(m^2).  General Appearance: Fairly Groomed  Eye Contact:  Good  Speech:  Pressured  Volume:  Normal  Mood:  Angry, Anxious, Dysphoric and Irritable  Affect:  Congruent  Thought Process:  Disorganized  Orientation:  Full (Time, Place, and Person)  Thought Content:  Delusions and Paranoid Ideation  Suicidal Thoughts:  No  Homicidal Thoughts:  No  Memory:  Immediate;   Fair Recent;   Fair Remote;   Fair  Judgement:  Poor  Insight:  Lacking  Psychomotor Activity:  Normal  Concentration:  Concentration: Fair and Attention Span: Fair  Recall:  [employer] of Knowledge:  Fair  Language:  Fair  Akathisia:  No  Handed:  Right  AIMS (if indicated):     Assets:  Communication Skills Desire for Improvement Financial Resources/Insurance Housing Physical Health Resilience Social Support Transportation  ADL's:  Intact  Cognition:  WNL  Sleep:  Number of Hours: 5     Treatment Plan Summary: Daily contact with patient to assess and evaluate symptoms and progress in treatment and Medication management   Ms. Ferrell is a 60 year old female with history of bipolar disorder, PTSD and chronic pain admitted in distress in the context of family problems.  1. Mood and psychosis. At the patient was restarted on Trileptal for mood stabilization. Abilify was added for psychosis. I will increase Abilify to  30 mg daily.  2. Insomnia. She is on trazodone 300 mg nightly.  3. COPD. She is on albuterol and Flonase.  4. Sleep apnea. She is on CPAP.  5. Smoking. Nicotine patch is available.  6. GERD. She is on Nexium.   7. Dyslipidemia. She is on  Pravachol.   8. Chronic pain. She is on OxyCodone. We will limit it to once a day at bedtime.   9. PTSD. We will start Minipress 2 mg bid for nightmares and flashbacks and Valium for panic attacks.   10. Constipation. The patient is on bowel regimen.  11. Metabolic syndromes screening. Lipid profile shows elevated triglycerides, TSH is normal, Hemoglobin A1c 6.4, Prolactin 51.9.  12. Urinary incontinence. Will start Ditropan.  13. Dry eyes. She has eye drops available.   14. Disposition. She will be discharged to home with family. She will follow-up with [facility] in [place].  Orson Slick, MD 11/22/2015, 12:57 PM

## 2015-11-22 NOTE — Plan of Care (Signed)
Problem: Coping: Goal: Ability to identify and develop effective coping behavior will improve Outcome: Not Progressing Unable  To focus  And understand certain   situations

## 2015-11-22 NOTE — [facility] Group Notes (Signed)
[facility] Group Notes:  (Nursing/MHT/Case Management/Adjunct)  Date:  11/22/2015  Time:  10:01 PM  Type of Therapy:  Evening Wrap-up Group  Participation Level: Active  Participation Quality:  Intrusive  Affect:  Irritable  Cognitive:  Disorganized  Insight:  None  Engagement in Group:  Off Topic  Modes of Intervention:  Discussion  Summary of Progress/Problems:  Levonne Spiller 11/22/2015, 10:01 PM

## 2015-11-22 NOTE — Telephone Encounter (Signed)
No showed her NCV/EMG appointment.

## 2015-11-23 ENCOUNTER — Encounter: Payer: Self-pay | Admitting: Neurology

## 2015-11-23 MED ORDER — NAPHAZOLINE-GLYCERIN 0.012-0.2 % OP SOLN
1.0000 [drp] | Freq: Four times a day (QID) | OPHTHALMIC | Status: DC | PRN
  Filled 2015-11-23: qty 15

## 2015-11-23 MED ORDER — FLUTICASONE PROPIONATE 50 MCG/ACT NA SUSP
2.0000 | Freq: Every day | NASAL | Status: DC
  Filled 2015-11-23: qty 16

## 2015-11-23 MED ORDER — ALBUTEROL SULFATE HFA 108 (90 BASE) MCG/ACT IN AERS
2.0000 | INHALATION_SPRAY | RESPIRATORY_TRACT | Status: DC | PRN
  Filled 2015-11-23: qty 6.7
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
## 2015-11-23 NOTE — Progress Notes (Signed)
  [facility] Adult Case Management Discharge Plan :  Will you be returning to the same living situation after discharge:  Yes,  home At discharge, do you have transportation home?: Yes,  cab Do you have the ability to pay for your medications: Yes,  insurance, income   Release of information consent forms completed and in the chart;  Patient's signature needed at discharge.  Patient to Follow up at: Follow-up Information    Follow up with [facility] On 11/27/2015.   Why:  Your hospital follow up appointment is Monday June 19th at 4:00pm. Please call at least 48 hours in advance if you need to reschedule.    Contact information:   [address]  [place] [address] [phone]       Next level of care provider has access to [place] and Suicide Prevention discussed: Yes,  with patient   Have you used any form of tobacco in the last 30 days? (Cigarettes, Smokeless Tobacco, Cigars, and/or Pipes): Yes  Has patient been referred to the Quitline?: Patient refused referral  Patient has been referred for addiction treatment: Pt. refused referral  Wray Kearns MSW, [place]  11/23/2015, 10:39 AM

## 2015-11-23 NOTE — Tx Team (Signed)
Interdisciplinary Treatment Plan Update (Adult)  Date:  11/23/2015 Time Reviewed:  10:40 AM  Progress in Treatment: Attending groups: Yes. Participating in groups:  Yes. Taking medication as prescribed:  Yes. Tolerating medication:  Yes. Family/Significant othe contact made:  No, will contact:  pt refused  Patient understands diagnosis:  No. and As evidenced by:  limited insight  Discussing patient identified problems/goals with staff:  Yes. Medical problems stabilized or resolved:  Yes. Denies suicidal/homicidal ideation: Yes. Issues/concerns per patient self-inventory:  Yes. Other:  New problem(s) identified: No, Describe:  NA  Discharge Plan or Barriers: Pt plans to return home and follow up with outpatient.    Reason for Continuation of Hospitalization: Mania Medication stabilization Withdrawal symptoms  Comments:Ms. because on remains disruptive, intrusive, suspicious, and disorganized. She feels that staff members and peers are out to get her. She is unable to participate in programming. She has been complaining about everything and feels mistreated by staff members. Luckily she had a bowel movement yesterday. This has been a problem and complained all along. The patient takes papaya feels at home but they are not available in the hospital even from her own supply as they are not FDA approved. The patient at times is incontinent of stool. This is unclear if it is related to chronic constipation brought about most likely by opioid use.  Estimated length of stay: Pt will likely d/c today.   New goal(s): NA  Review of initial/current patient goals per problem list:   1.  Goal(s): Patient will participate in aftercare plan * Met:  * Target date: at discharge * As evidenced by: Patient will participate within aftercare plan AEB aftercare provider and housing plan at discharge being identified.  2.  Goal(s): Patient will demonstrate decreased signs of withdrawal due to substance  abuse * Met:  * Target date: at discharge * As evidenced by: Patient will produce a CIWA/COWS score of 0, have stable vitals signs, and no symptoms of withdrawal.  3.  Goal (s): Patient will demonstrate decreased symptoms of mania. * Met: No  *  Target date: at discharge * As evidenced by: Patient will not endorse signs of mania or be deemed stable for discharge by MD.   Attendees: Patient:  Cynthia Deleon 6/15/201710:40 AM  Family:   6/15/201710:40 AM  Physician: . Dr. Bary Leriche   6/15/201710:40 AM  Nursing:   Meredith Mody, RN  6/15/201710:40 AM  Case Manager:   6/15/201710:40 AM  Counselor:   6/15/201710:40 AM  Other:  Wray Kearns, LCSWA 6/15/201710:40 AM  Other:  Everitt Amber, LRT  6/15/201710:40 AM  Other:   6/15/201710:40 AM  Other:  6/15/201710:40 AM  Other:  6/15/201710:40 AM  Other:  6/15/201710:40 AM  Other:  6/15/201710:40 AM  Other:  6/15/201710:40 AM  Other:  6/15/201710:40 AM  Other:   6/15/201710:40 AM   Scribe for Treatment Team:   Wray Kearns MSW, [place] , 11/23/2015, 10:40 AM

## 2015-11-23 NOTE — BHH Suicide Risk Assessment (Signed)
[facility] Discharge Suicide Risk Assessment   Principal Problem: Bipolar I disorder, most recent episode mixed, severe with psychotic features [facility]) Discharge Diagnoses:  Patient Active Problem List   Diagnosis Date Noted  . OCD (obsessive compulsive disorder) [F42.9] 11/15/2015  . Tobacco use disorder [F17.200] 11/15/2015  . Bipolar affective disorder, mixed, severe ([place]) [F31.63] 11/15/2015  . Peripheral neuropathy ([facility]) [G62.9] 10/10/2015  . Current use of estrogen therapy [Z79.899] 01/12/2014  . PTSD (post-traumatic stress disorder) [F43.10] 08/03/2011  . Bipolar I disorder, most recent episode mixed, severe with psychotic features ([place]) [F31.64]     Total Time spent with patient: 30 minutes  Musculoskeletal: Strength & Muscle Tone: within normal limits Gait & Station: normal Patient leans: N/A  Psychiatric Specialty Exam: Review of Systems  Psychiatric/Behavioral: The patient is nervous/anxious and has insomnia.   All other systems reviewed and are negative.   Blood pressure 141/69, pulse 89, temperature 98.1 F (36.7 C), temperature source Oral, resp. rate 18, height 5\' 9"  (1.753 m), weight 79.833 kg (176 lb), SpO2 95 %.Body mass index is 25.98 kg/(m^2).  General Appearance: Casual  Eye Contact::  Good  Speech:  Clear and [id number]  Volume:  Normal  Mood:  Anxious  Affect:  Appropriate  Thought Process:  Goal Directed  Orientation:  Full (Time, Place, and Person)  Thought Content:  WDL  Suicidal Thoughts:  No  Homicidal Thoughts:  No  Memory:  Immediate;   Fair Recent;   Fair Remote;   Fair  Judgement:  Fair  Insight:  Fair  Psychomotor Activity:  Normal  Concentration:  Fair  Recall:  [employer] of Knowledge:Fair  Language: Fair  Akathisia:  No  Handed:  Right  AIMS (if indicated):     Assets:  Communication Skills Desire for Improvement Financial Resources/Insurance [place] Talents/Skills Transportation   Sleep:  Number of Hours: 5  Cognition: WNL  ADL's:  Intact   Mental Status Per Nursing Assessment::   On Admission:     Demographic Factors:  Divorced or widowed, Caucasian and Living alone  Loss Factors: Loss of significant relationship  Historical Factors: Prior suicide attempts and Impulsivity  Risk Reduction Factors:   Sense of responsibility to family, Positive social support and Positive therapeutic relationship  Continued Clinical Symptoms:  Bipolar Disorder:   Depressive phase  Cognitive Features That Contribute To Risk:  None    Suicide Risk:  Minimal: No identifiable suicidal ideation.  Patients presenting with no risk factors but with morbid ruminations; may be classified as minimal risk based on the severity of the depressive symptoms    Plan Of Care/Follow-up recommendations:  Activity:  as tolerated. Diet:  low sodium heart healthy. Other:  keep follow up appointments.  Orson Slick, MD 11/23/2015, 7:17 AM

## 2015-11-23 NOTE — Progress Notes (Signed)
Recreation Therapy Notes  Date: 06.15.17 Time: 9:30 am Location: Craft Room  Group Topic: Leisure Education  Goal Area(s) Addresses:  Patient will identify activities for each letter of the alphabet. Patient will verbalize ability to integrate positive leisure into life post d/c. Patient will verbalize ability to use leisure as a Technical sales engineer.  Behavioral Response: Attentive, Interactive, Disruptive  Intervention: Leisure Alphabet  Activity: Patients were given a Leisure Air traffic controller and instructed to identify leisure activities for each letter of the alphabet.  Education: LRT educated patients on what they need to participate in leisure.  Education Outcome: Acknowledges education/In group clarification offered   Clinical Observations/Feedback: Patient wrote down leisure activities with assistance from LRT and nursing student. Patient contributed to group discussion by stating some healthy leisure activities and why leisure is a good Technical sales engineer.  Leonette Monarch, LRT/CTRS 11/23/2015 10:27 AM

## 2015-11-23 NOTE — Progress Notes (Signed)
D: Patient is alert and oriented on the unit this shift. Patient attended and actively participated in groups today. Patient denies suicidal ideation, homicidal ideation, auditory or visual hallucinations at the present time. Patient agitated all evening as she argues with everyone over everything this shift A: Scheduled medications are administered to patient as per MD orders. Emotional support and encouragement are provided. Patient is maintained on q.15 minute safety checks. Patient is informed to notify staff with questions or concerns. R: No adverse medication reactions are noted. Patient is cooperative with medication administration and treatment plan today. Patient is  Agitated   But  cooperative on the unit at this time. Patient interacts   with others but starts arguments with other patients on the unit this shift. Patient contracts for safety at this time. Patient remains safe at this time.

## 2015-11-23 NOTE — Plan of Care (Signed)
Problem: Coping: Goal: Ability to identify and develop effective coping behavior will improve Outcome: Not Progressing Patient to agitated to comprehend or discuss frustrations at this time [facility] RN

## 2015-11-23 NOTE — Progress Notes (Signed)
D:Patient aware of discharge this shift . Patient returning home . Patient received all belonging locked up . Patient denies  Suicidal  And homicidal ideations  .  A: Writer instructed on discharge criteria  Patient  received  AVS Transition , Discharge Form and Suicide Risk Assessment, and perscriptions . Aware  Of follow up appointment . R: Patient left unit with no questions  Or concerns leff with riding cab

## 2015-12-01 NOTE — Telephone Encounter (Signed)
Expand All Collapse All   No showed her NCV/EMG appointment

## 2015-12-15 ENCOUNTER — Other Ambulatory Visit (HOSPITAL_COMMUNITY): Payer: Self-pay | Admitting: Family Medicine

## 2015-12-15 DIAGNOSIS — Z1231 Encounter for screening mammogram for malignant neoplasm of breast: Secondary | ICD-10-CM

## 2015-12-27 ENCOUNTER — Ambulatory Visit (HOSPITAL_COMMUNITY)
Admission: RE | Admit: 2015-12-27 | Discharge: 2015-12-27 | Disposition: A | Discharge status: Home / Self Care | Payer: Medicaid Other | Source: Ambulatory Visit | Attending: Family Medicine | Admitting: Family Medicine

## 2015-12-27 DIAGNOSIS — Z1231 Encounter for screening mammogram for malignant neoplasm of breast: Secondary | ICD-10-CM | POA: Insufficient documentation

## 2015-12-29 ENCOUNTER — Other Ambulatory Visit: Payer: Self-pay | Admitting: Family Medicine

## 2015-12-29 DIAGNOSIS — R928 Other abnormal and inconclusive findings on diagnostic imaging of breast: Secondary | ICD-10-CM

## 2016-01-08 ENCOUNTER — Other Ambulatory Visit: Payer: Self-pay

## 2016-01-09 ENCOUNTER — Encounter (HOSPITAL_COMMUNITY): Payer: Self-pay

## 2016-01-09 ENCOUNTER — Ambulatory Visit (HOSPITAL_COMMUNITY)
Admission: RE | Admit: 2016-01-09 | Discharge: 2016-01-09 | Disposition: A | Discharge status: Home / Self Care | Payer: Medicaid Other | Source: Ambulatory Visit | Attending: Family Medicine | Admitting: Family Medicine

## 2016-01-09 DIAGNOSIS — R928 Other abnormal and inconclusive findings on diagnostic imaging of breast: Secondary | ICD-10-CM

## 2016-01-09 DIAGNOSIS — N6001 Solitary cyst of right breast: Secondary | ICD-10-CM | POA: Insufficient documentation

## 2016-01-09 DIAGNOSIS — N6002 Solitary cyst of left breast: Secondary | ICD-10-CM | POA: Insufficient documentation

## 2016-02-21 ENCOUNTER — Other Ambulatory Visit: Payer: Self-pay | Admitting: Physician Assistant

## 2016-02-21 ENCOUNTER — Ambulatory Visit
Admission: RE | Admit: 2016-02-21 | Discharge: 2016-02-21 | Disposition: A | Discharge status: Home / Self Care | Payer: Medicaid Other | Source: Ambulatory Visit | Attending: Physician Assistant | Admitting: Physician Assistant

## 2016-02-21 DIAGNOSIS — R109 Unspecified abdominal pain: Secondary | ICD-10-CM

## 2016-03-27 ENCOUNTER — Other Ambulatory Visit: Payer: Self-pay | Admitting: General Surgery

## 2016-03-27 DIAGNOSIS — G8929 Other chronic pain: Secondary | ICD-10-CM

## 2016-03-27 DIAGNOSIS — R1013 Epigastric pain: Secondary | ICD-10-CM

## 2016-03-28 ENCOUNTER — Ambulatory Visit (HOSPITAL_COMMUNITY): Payer: Medicaid Other

## 2016-04-03 ENCOUNTER — Ambulatory Visit (HOSPITAL_COMMUNITY)
Admission: RE | Admit: 2016-04-03 | Discharge: 2016-04-03 | Disposition: A | Discharge status: Home / Self Care | Payer: Medicaid Other | Source: Ambulatory Visit | Attending: General Surgery | Admitting: General Surgery

## 2016-04-03 DIAGNOSIS — G8929 Other chronic pain: Secondary | ICD-10-CM | POA: Insufficient documentation

## 2016-04-03 DIAGNOSIS — R1013 Epigastric pain: Secondary | ICD-10-CM | POA: Diagnosis present

## 2016-04-03 DIAGNOSIS — I700 Atherosclerosis of aorta: Secondary | ICD-10-CM | POA: Insufficient documentation

## 2016-04-03 DIAGNOSIS — N329 Bladder disorder, unspecified: Secondary | ICD-10-CM | POA: Diagnosis not present

## 2016-04-03 DIAGNOSIS — N8189 Other female genital prolapse: Secondary | ICD-10-CM | POA: Diagnosis not present

## 2016-04-03 DIAGNOSIS — K760 Fatty (change of) liver, not elsewhere classified: Secondary | ICD-10-CM | POA: Insufficient documentation

## 2016-04-03 LAB — POCT I-STAT CREATININE: CREATININE: 0.8 mg/dL (ref 0.44–1.00)

## 2016-04-03 MED ORDER — IOPAMIDOL (ISOVUE-300) INJECTION 61%
100.0000 mL | Freq: Once | INTRAVENOUS | Status: AC | PRN
  Administered 2016-04-03: 100 mL via INTRAVENOUS

## 2016-04-03 MED ORDER — BARIUM SULFATE 0.1 % PO SUSP
ORAL | Status: AC
  Filled 2016-04-03: qty 3

## 2016-04-17 ENCOUNTER — Ambulatory Visit (INDEPENDENT_AMBULATORY_CARE_PROVIDER_SITE_OTHER): Payer: Medicaid Other | Admitting: Neurology

## 2016-04-17 ENCOUNTER — Encounter: Payer: Self-pay | Admitting: *Deleted

## 2016-04-17 ENCOUNTER — Telehealth: Payer: Self-pay | Admitting: Neurology

## 2016-04-17 DIAGNOSIS — G6289 Other specified polyneuropathies: Secondary | ICD-10-CM

## 2016-04-17 DIAGNOSIS — F17200 Nicotine dependence, unspecified, uncomplicated: Secondary | ICD-10-CM | POA: Diagnosis not present

## 2016-04-17 DIAGNOSIS — R21 Rash and other nonspecific skin eruption: Secondary | ICD-10-CM

## 2016-04-17 DIAGNOSIS — R7309 Other abnormal glucose: Secondary | ICD-10-CM

## 2016-04-17 DIAGNOSIS — M79671 Pain in right foot: Secondary | ICD-10-CM

## 2016-04-17 DIAGNOSIS — R3915 Urgency of urination: Secondary | ICD-10-CM

## 2016-04-17 MED ORDER — GABAPENTIN 300 MG PO CAPS: 300.0000 mg | ORAL_CAPSULE | Freq: Three times a day (TID) | ORAL | 11 refills | Status: DC

## 2016-04-17 NOTE — Telephone Encounter (Signed)
Skin Biopsy scheduled at check-out.  We have the supplies needed in stock with valid dates.

## 2016-04-17 NOTE — Telephone Encounter (Signed)
Skin biopsy

## 2016-04-19 NOTE — Procedures (Signed)
   NCS (NERVE CONDUCTION STUDY) WITH EMG (ELECTROMYOGRAPHY) REPORT   STUDY DATE: November eighth 2017 PATIENT NAME: Cynthia Deleon DOB: 11-12-1955 MRN: [id number]    TECHNOLOGIST: Laretta Alstrom ELECTROMYOGRAPHER: Marcial Pacas M.D.  CLINICAL INFORMATION:  60 years old female presented with few years history of bilateral feet paresthesia and pain  On examination: She has bilateral feet shiny, reddish purplish discoloration, mild length dependent decreased pinprick to ankle level. There was no significant weakness. There was preserved ankle reflexes.  FINDINGS: NERVE CONDUCTION STUDY: Bilateral sural, peroneal sensory responses were normal. Bilateral peroneal to EDB and tibial motor responses were normal. Tibial H reflexes were normal and symmetric.   NEEDLE ELECTROMYOGRAPHY: Selected needle examination was performed at right lower extremity muscles, right lumbar sacral paraspinal muscles.  Needle examination of right tibialis anterior, tibialis posterior, medial gastrocnemius, vastus lateralis was normal.  There was no spontaneous activity at right lumbosacral paraspinal muscles, right L4-5 S1.  IMPRESSION:   This is a normal study. There is no electrodiagnostic evidence of large fiber peripheral neuropathy, or right lumbosacral radiculopathy.   INTERPRETING PHYSICIAN:   Marcial Pacas M.D. Ph.D. [facility] [address] [place], [place] [address] [phone]

## 2016-04-19 NOTE — Progress Notes (Signed)
PATIENT: Cynthia Deleon DOB: Sep 11, 1955  No chief complaint on file.    HISTORICAL  Cynthia Deleon is a 60 years old right-handed female, unknown at today's clinical visit,, seen in refer by her pain management doctor [place] for evaluation of bilateral feet paresthesia.  He had a past medical history of bipolar disorder, is taking Trileptal 600 mg twice a day, also had a history of hyperlipidemia, COPD, irritable bowel syndrome, progressive worsening urinary urgency, obstructive sleep apnea, using CPAP machine, longer use of use oxygen during night time, she had 5 vaginal delivery, her youngest child is 84 years old, she had rectal muscle torned during delivery, has chronic bowel incontinence, getting worse since 2015, failed attempted surgical correction, now she has to wear pull-ups, she is a longtime smoker, still smoke about a pack a day.   She complains of bilateral feet numbness around 2012, progressive worsening, now constant bilateral foot burning pain, mild gait difficulty, has to put her feet into ice, denies bilateral fingertips paresthesia, she has chronic low back pain, midline muscle spasm, occasionally radiating pain to bilateral lower extremity, she also has chronic neck pain, radiating to bilateral shoulder, but not to her upper extremity all hands, she has been receiving epidural injection by Dr. Nelva Bush for many years, She is planning on to have repeat injection in May third 2017.  Today she also complains of worsening urinary urgency, occasionally incontinence and bowel incontinence, she had vaginal/rectal tissue torn  during her 5 vaginal delivery, the youngest one is 60 years old, since 2015, she has almost total loss of bowel control, has to wear pull-ups.  She has a nonhealing right median ankle skin warm, with yellow discharge for 2 weeks. Also noticed breakout of rash throughout  bilateral upper, lower extremity, and chest, a week history of worsening urinary  urgency, dysuria, pelvic heaviness sensation. Generalized malaise,  Update April 17 2016: Patient returned for electrodiagnostic study today, which showed no evidence of large fiber peripheral neuropathy, right lumbosacral radiculopathy.  She continue complains of significant bilateral lower extremity paresthesia and pain, we have personally reviewed MRI of cervical spine and lumbar spine in May 2017, both showed mild degenerative disc disease, there was no significant foraminal canal stenosis.  I reviewed laboratory evaluation in 2017, normal TSH, triglyceride was elevated 240, A1c was 6.4, UDS was positive for benzodiazepine, negative alcohol, normal CBC, BMP, with exception of elevated glucose 128, creatinine was normal 0.68, Lyme titer was negative,   REVIEW OF SYSTEMS: Full 14 system review of systems performed and notable only for as above  ALLERGIES: Allergies  Allergen Reactions  . Chantix [Varenicline] Other (See Comments)    Altered mental status-Per patient was put on allergy list by Dr Lorriane Shire bu patient staes she is not allergic to this and is currently taking it.   . Ambien [Zolpidem] Other (See Comments)    Causes sleep walking  . Codeine Nausea And Vomiting  . Divalproex Sodium Other (See Comments)    Hallucinations   . Hydrocodone Nausea And Vomiting  . Paxil [Paroxetine Hcl] Other (See Comments)    Causes ringing in the ears.   . Sulfur Other (See Comments)    Swelling of tongue  . Amoxicillin Rash    HOME MEDICATIONS: Current Outpatient Prescriptions  Medication Sig Dispense Refill  . albuterol (PROVENTIL HFA;VENTOLIN HFA) 108 (90 Base) MCG/ACT inhaler Inhale 2 puffs into the lungs every 4 (four) hours as needed for wheezing or shortness of breath. 1  Inhaler 0  . diazepam (VALIUM) 5 MG tablet Take 1 tablet (5 mg total) by mouth every 8 (eight) hours as needed (vertigo). (Patient taking differently: Take 5 mg by mouth 3 (three) times daily. ) 10 tablet 0  .  esomeprazole (NEXIUM) 40 MG capsule Take 40 mg by mouth 2 (two) times daily before a meal.     . estrogens, conjugated, (PREMARIN) 0.3 MG tablet Take 0.3 mg by mouth daily. Take daily for 21 days then do not take for 7 days.    [name] gabapentin (NEURONTIN) 300 MG capsule Take 1 capsule (300 mg total) by mouth 3 (three) times daily. 90 capsule 11  . ketoconazole (NIZORAL) 2 % shampoo Apply 1 application topically 2 (two) times a week.    . mirabegron ER (MYRBETRIQ) 25 MG TB24 tablet Take 25 mg by mouth daily.    . Oxcarbazepine (TRILEPTAL) 300 MG tablet Take 1 tablet (300 mg total) by mouth 2 (two) times daily at 8 am and 10 pm. 60 tablet 0  . oxyCODONE (ROXICODONE) 15 MG immediate release tablet Take 15 mg by mouth 4 (four) times daily.     . pravastatin (PRAVACHOL) 40 MG tablet Take 40 mg by mouth daily. Reported on 06/06/2015    . prazosin (MINIPRESS) 2 MG capsule Take 1 capsule (2 mg total) by mouth 2 (two) times daily at 8 am and 10 pm. 60 capsule 0  . trazodone (DESYREL) 300 MG tablet Take 600 mg by mouth at bedtime as needed for sleep.    [name] Umeclidinium-Vilanterol (ANORO ELLIPTA) 62.5-25 MCG/INH AEPB Inhale 1 puff into the lungs daily.    . vitamin B-12 1000 MCG tablet Take 1 tablet (1,000 mcg total) by mouth daily with breakfast. 30 tablet 0   No current facility-administered medications for this visit.     PAST MEDICAL HISTORY: Past Medical History:  Diagnosis Date  . Arthritis    hands and knees  . Bipolar 1 disorder (HCC) Degenerative disk disease  . Chronic back pain   . Chronic neck pain   . Complication of anesthesia    high anxiety-does not want to be alone  . COPD (chronic obstructive pulmonary disease) ([place])   . Current use of estrogen therapy 01/12/2014  . Depression   . Headache   . Hot flashes 12/15/2013  . Hypertension   . IBS (irritable bowel syndrome)   . Incontinence   . Kidney stone   . Multiple personality disorder   . Panic attacks   . Peptic ulcer   .  Peripheral neuropathy ([place])   . Rosacea   . Shingles   . Sleep apnea    uses a cpap-with oxygen    PAST SURGICAL HISTORY: Past Surgical History:  Procedure Laterality Date  . ABDOMINAL HYSTERECTOMY    . BUNIONECTOMY WITH HAMMERTOE RECONSTRUCTION Right 12/10/2012   Procedure: RIGHT FIRST METATARSAL CHEVRON BUNION CORRECTION,  2 AND 3 HAMMERTOE CORRECTION , RIGHT 3 AND 4 TOE NAIL EXCISION ;  Surgeon: Wylene Simmer, MD;  Location: [facility];  Service: Orthopedics;  Laterality: Right;  . FOOT ARTHRODESIS  2000   both feet  . RECTAL SURGERY    . TONSILLECTOMY      FAMILY HISTORY: Family History  Problem Relation Age of Onset  . Diabetes Mother   . Other Mother     vertigo; chronic eye disease  . COPD Father   . COPD Sister   . Alcohol abuse Brother   . Diabetes Maternal Grandmother   .  Diabetes Maternal Grandfather   . Diabetes Sister   . Other Sister     hearing problems  . Hyperlipidemia Sister   . Alcohol abuse Brother   . COPD Brother   . Alcohol abuse Brother   . Alcohol abuse Brother   . Other Brother     aneursym    SOCIAL HISTORY:  Social History   Social History  . Marital status: Divorced    Spouse name: N/A  . Number of children: 5  . Years of education: 9th   Occupational History  . Disabled    Social History Main Topics  . Smoking status: Former Smoker    Packs/day: 0.50    Years: 35.00    Types: Cigarettes    Quit date: 10/19/2012  . Smokeless tobacco: Never Used  . Alcohol use No  . Drug use: No  . Sexual activity: No     Comment: occ cigarette   Other Topics Concern  . Not on file   Social History Narrative   Lives at home with home with her son (has five children).   Right-handed.   1 cup caffeine per day.     PHYSICAL EXAM   There were no vitals filed for this visit.  Not recorded      There is no height or weight on file to calculate BMI.  PHYSICAL EXAMNIATION:  Gen: NAD, conversant, well nourised, obese,  well groomed                     Cardiovascular: Regular rate rhythm, no peripheral edema, warm, nontender. Eyes: Conjunctivae clear without exudates or hemorrhage Neck: Supple, no carotid bruise. Pulmonary: Clear to auscultation bilaterally  Musculoskeletal: Above right medial malleolus 1 time 1 cm shallow wound with red erythematous base, yellow clear serous discharge  NEUROLOGICAL EXAM:  MENTAL STATUS: Speech:    Speech is normal; fluent and spontaneous with normal comprehension.  Cognition:     Orientation to time, place and person     Normal recent and remote memory     Normal Attention span and concentration     Normal Language, naming, repeating,spontaneous speech     Fund of knowledge   CRANIAL NERVES: CN II: Visual fields are full to confrontation. Fundoscopic exam is normal with sharp discs and no vascular changes. Pupils are round equal and briskly reactive to light. CN III, IV, VI: extraocular movement are normal. No ptosis. CN V: Facial sensation is intact to pinprick in all 3 divisions bilaterally. Corneal responses are intact.  CN VII: Face is symmetric with normal eye closure and smile. CN VIII: Hearing is normal to rubbing fingers CN IX, X: Palate elevates symmetrically. Phonation is normal. CN XI: Head turning and shoulder shrug are intact CN XII: Tongue is midline with normal movements and no atrophy.  MOTOR: There is no pronator drift of out-stretched arms. Muscle bulk and tone are normal. Muscle strength is normal.  REFLEXES: Reflexes are 2+ and symmetric at the biceps, triceps, knees, and ankles. Plantar responses are flexor.  SENSORY: Length dependent decreased to light touch, pinprick and vibratory sensation to distal shin level.  COORDINATION: Rapid alternating movements and fine finger movements are intact. There is no dysmetria on finger-to-nose and heel-knee-shin.    GAIT/STANCE: Posture is normal. Gait is steady with normal steps, base, arm  swing, and turning. She is able to stand on tiptoe and heels  Romberg is absent.   DIAGNOSTIC DATA (LABS, IMAGING, TESTING) - I reviewed  patient records, labs, notes, testing and imaging myself where available.   ASSESSMENT AND PLAN  ARIAN YUNK is a 60 y.o. female    Peripheral neuropathy  Possible small fiber neuropathy,  EMG nerve conduction study failed to demonstrate large fiber peripheral neuropathy  Laboratory evaluation today, there was evidence of elevated A1c 6.4, which is commonly associated with small fiber neuropathy  Skin biopsy   Start gabapentin 300 mg 3 times a day  Marcial Pacas, M.D. Ph.D.  [facility] [address] [place], [place] [address] Ph: [phone] Fax: [phone]  CC: Suella Broad, MD, Dr. Blanchie Serve

## 2016-04-22 ENCOUNTER — Telehealth: Payer: Self-pay | Admitting: Neurology

## 2016-04-22 LAB — MULTIPLE MYELOMA PANEL, SERUM
ALBUMIN SERPL ELPH-MCNC: 3.9 g/dL (ref 2.9–4.4)
ALBUMIN/GLOB SERPL: 1.4 (ref 0.7–1.7)
ALPHA 1: 0.2 g/dL (ref 0.0–0.4)
Alpha2 Glob SerPl Elph-Mcnc: 0.8 g/dL (ref 0.4–1.0)
B-Globulin SerPl Elph-Mcnc: 1.2 g/dL (ref 0.7–1.3)
Gamma Glob SerPl Elph-Mcnc: 0.8 g/dL (ref 0.4–1.8)
Globulin, Total: 3 g/dL (ref 2.2–3.9)
IGA/IMMUNOGLOBULIN A, SERUM: 219 mg/dL (ref 87–352)
IgG (Immunoglobin G), Serum: 759 mg/dL (ref 700–1600)
IgM (Immunoglobulin M), Srm: 64 mg/dL (ref 26–217)
Total Protein: 6.9 g/dL (ref 6.0–8.5)

## 2016-04-22 LAB — ANA W/REFLEX IF POSITIVE: ANA: NEGATIVE

## 2016-04-22 LAB — TSH: TSH: 0.684 u[IU]/mL (ref 0.450–4.500)

## 2016-04-22 LAB — FOLATE: FOLATE: 4.4 ng/mL (ref >3.0)

## 2016-04-22 LAB — RPR: RPR: NONREACTIVE

## 2016-04-22 LAB — METHYLMALONIC ACID, SERUM: METHYLMALONIC ACID: 132 nmol/L (ref 0–378)

## 2016-04-22 LAB — HGB A1C W/O EAG: HEMOGLOBIN A1C: 6.1 % — AB (ref 4.8–5.6)

## 2016-04-22 LAB — COPPER, SERUM: Copper: 116 ug/dL (ref 72–166)

## 2016-04-22 LAB — VITAMIN B12: VITAMIN B 12: 486 pg/mL (ref 211–946)

## 2016-04-22 LAB — B. BURGDORFI ANTIBODIES

## 2016-04-22 LAB — CK: Total CK: 82 U/L (ref 24–173)

## 2016-04-22 LAB — HIV ANTIBODY (ROUTINE TESTING W REFLEX): HIV SCREEN 4TH GENERATION: NONREACTIVE

## 2016-04-22 NOTE — Telephone Encounter (Signed)
Spoke to patient - she is aware of results and will follow up with her PCP.

## 2016-04-22 NOTE — Telephone Encounter (Signed)
Please call patient, only abnormality extensive laboratory evaluations elevated A1c 6.1, indicating elevated glucose level in the past 3 months, not bad enough to make diagnosis of diabetes,  She should go on diet, exercise, I will forward lab to her primary care physician

## 2016-05-22 ENCOUNTER — Encounter: Payer: Self-pay | Admitting: Neurology

## 2016-06-26 ENCOUNTER — Telehealth: Payer: Self-pay | Admitting: *Deleted

## 2016-06-26 NOTE — Telephone Encounter (Signed)
Spoke to patient - she is aware we are closed on 06/27/16 due to snow.  Offered to reschedule her appt while on the phone but she declined.  Stated she call back on Friday to make a new appt with Dr. Krista Blue.

## 2016-06-27 ENCOUNTER — Ambulatory Visit: Payer: Self-pay | Admitting: Neurology

## 2016-07-01 ENCOUNTER — Ambulatory Visit: Payer: Medicaid Other | Admitting: Neurology

## 2016-09-03 ENCOUNTER — Encounter: Payer: Self-pay | Admitting: *Deleted

## 2016-09-03 ENCOUNTER — Encounter: Payer: Self-pay | Admitting: Neurology

## 2016-09-03 ENCOUNTER — Encounter (INDEPENDENT_AMBULATORY_CARE_PROVIDER_SITE_OTHER): Payer: Self-pay

## 2016-09-03 ENCOUNTER — Ambulatory Visit (INDEPENDENT_AMBULATORY_CARE_PROVIDER_SITE_OTHER): Payer: Medicaid Other | Admitting: Neurology

## 2016-09-03 VITALS — BP 126/71 | HR 83 | Temp 98.7°F | Ht 69.0 in | Wt 173.0 lb

## 2016-09-03 DIAGNOSIS — G6289 Other specified polyneuropathies: Secondary | ICD-10-CM | POA: Diagnosis not present

## 2016-09-03 NOTE — Progress Notes (Signed)
    PATIENT: Cynthia Deleon DOB: 1955/09/06  Chief Complaint  Patient presents with  . Polyneuropathy    Feels gabapentin 300mg , TID helps keep the tingling/pain in her feet tolerable.     HISTORICAL  Cynthia Deleon is a 61 years old right-handed female, unknown at today's clinical visit,, seen in refer by her pain management doctor [place] for evaluation of bilateral feet paresthesia.  He had a past medical history of bipolar disorder, is taking Trileptal 600 mg twice a day, also had a history of hyperlipidemia, COPD, irritable bowel syndrome, progressive worsening urinary urgency, obstructive sleep apnea, using CPAP machine, longer use of use oxygen during night time, she had 5 vaginal delivery, her youngest child is 85 years old, she had rectal muscle torned during delivery, has chronic bowel incontinence, getting worse since 2015, failed attempted surgical correction, now she has to wear pull-ups, she is a longtime smoker, still smoke about a pack a day.   She complains of bilateral feet numbness around 2012, progressive worsening, now constant bilateral foot burning pain, mild gait difficulty, has to put her feet into ice, denies bilateral fingertips paresthesia, she has chronic low back pain, midline muscle spasm, occasionally radiating pain to bilateral lower extremity, she also has chronic neck pain, radiating to bilateral shoulder, but not to her upper extremity all hands, she has been receiving epidural injection by Dr. Nelva Bush for many years, She is planning on to have repeat injection in May third 2017.  Today she also complains of worsening urinary urgency, occasionally incontinence and bowel incontinence, she had vaginal/rectal tissue torn  during her 5 vaginal delivery, the youngest one is 61 years old, since 2015, she has almost total loss of bowel control, has to wear pull-ups.  She has a nonhealing right median ankle skin warm, with yellow discharge for 2 weeks. Also noticed  breakout of rash throughout  bilateral upper, lower extremity, and chest, a week history of worsening urinary urgency, dysuria, pelvic heaviness sensation. Generalized malaise,  Update April 17 2016: Patient returned for electrodiagnostic study today, which showed no evidence of large fiber peripheral neuropathy, right lumbosacral radiculopathy.  She continue complains of significant bilateral lower extremity paresthesia and pain, we have personally reviewed MRI of cervical spine and lumbar spine in May 2017, both showed mild degenerative disc disease, there was no significant foraminal canal stenosis.  I reviewed laboratory evaluation in 2017, normal TSH, triglyceride was elevated 240, A1c was 6.4, UDS was positive for benzodiazepine, negative alcohol, normal CBC, BMP, with exception of elevated glucose 128, creatinine was normal 0.68, Lyme titer was negative,  Update September 04 2016 Patient returned for skin biopsy today,  Patient was in left lateral recombinant position. Sterile technique. 1% lidocaine with epinephrine was used for local anesthesia. Punctuated skin biopsy was performed. 3 mm skin sample were obtained at at right foot, above right extensor digitorum brevis, and right lateral calf, 10 cm above lateral malleolus, lateral thigh, 20 cm below superior iliac spine.  Patient tolerated the procedure well.  The wound was covered with neosporin antibiotic cream and bandage.

## 2016-09-08 DIAGNOSIS — G4733 Obstructive sleep apnea (adult) (pediatric): Secondary | ICD-10-CM | POA: Diagnosis not present

## 2016-09-08 DIAGNOSIS — J449 Chronic obstructive pulmonary disease, unspecified: Secondary | ICD-10-CM | POA: Diagnosis not present

## 2016-09-11 ENCOUNTER — Telehealth: Payer: Self-pay | Admitting: *Deleted

## 2016-09-11 NOTE — Telephone Encounter (Signed)
Skin Biopsy completed on 09/03/16:  Diagnosis:  A) Rt Thigh - skin with significantly reduced Epidermal Nerve Fiber Density, consistent with small fiber neuropathy. B) Rt Calf - skin with significantly reduced Epidermal Nerve Fiber Density, consistent with small fiber neuropathy. C) Rt Foot - skin with significantly reduced Epidermal Nerve Fiber Density, consistent with small fiber neuropathy.  Dr. Krista Blue has reviewed these results and the patient has been notified.  She is currently doing well on gabapentin 300mg , TID.  She will keep her regular follow up appt and call us with any concerns.  The results have been faxed to her PCP, Dr. Olen Pel and her Orthopaedic MD, Dr. Nelva Bush.

## 2016-09-15 ENCOUNTER — Emergency Department (HOSPITAL_COMMUNITY)
Admission: EM | Admit: 2016-09-15 | Discharge: 2016-09-15 | Disposition: A | Discharge status: Home / Self Care | Payer: BLUE CROSS/BLUE SHIELD | Attending: Emergency Medicine | Admitting: Emergency Medicine

## 2016-09-15 ENCOUNTER — Encounter (HOSPITAL_COMMUNITY): Payer: Self-pay | Admitting: Emergency Medicine

## 2016-09-15 ENCOUNTER — Emergency Department (HOSPITAL_COMMUNITY): Payer: BLUE CROSS/BLUE SHIELD

## 2016-09-15 DIAGNOSIS — I10 Essential (primary) hypertension: Secondary | ICD-10-CM | POA: Diagnosis not present

## 2016-09-15 DIAGNOSIS — Z79899 Other long term (current) drug therapy: Secondary | ICD-10-CM | POA: Insufficient documentation

## 2016-09-15 DIAGNOSIS — R4182 Altered mental status, unspecified: Secondary | ICD-10-CM | POA: Diagnosis not present

## 2016-09-15 DIAGNOSIS — Z87891 Personal history of nicotine dependence: Secondary | ICD-10-CM | POA: Insufficient documentation

## 2016-09-15 DIAGNOSIS — J449 Chronic obstructive pulmonary disease, unspecified: Secondary | ICD-10-CM | POA: Insufficient documentation

## 2016-09-15 DIAGNOSIS — F439 Reaction to severe stress, unspecified: Secondary | ICD-10-CM | POA: Diagnosis not present

## 2016-09-15 DIAGNOSIS — R410 Disorientation, unspecified: Secondary | ICD-10-CM | POA: Diagnosis not present

## 2016-09-15 DIAGNOSIS — F430 Acute stress reaction: Secondary | ICD-10-CM

## 2016-09-15 HISTORY — DX: Prediabetes: R73.03

## 2016-09-15 LAB — CBC WITH DIFFERENTIAL/PLATELET
BASOS ABS: 0.1 10*3/uL (ref 0.0–0.1)
Basophils Relative: 0 %
EOS ABS: 0.2 10*3/uL (ref 0.0–0.7)
Eosinophils Relative: 1 %
HCT: 46.3 % — ABNORMAL HIGH (ref 36.0–46.0)
HEMOGLOBIN: 15.6 g/dL — AB (ref 12.0–15.0)
Lymphocytes Relative: 20 %
Lymphs Abs: 2.6 10*3/uL (ref 0.7–4.0)
MCH: 32.4 pg (ref 26.0–34.0)
MCHC: 33.7 g/dL (ref 30.0–36.0)
MCV: 96.1 fL (ref 78.0–100.0)
Monocytes Absolute: 0.8 10*3/uL (ref 0.1–1.0)
Monocytes Relative: 6 %
NEUTROS PCT: 73 %
Neutro Abs: 9.8 10*3/uL — ABNORMAL HIGH (ref 1.7–7.7)
Platelets: 251 10*3/uL (ref 150–400)
RBC: 4.82 MIL/uL (ref 3.87–5.11)
RDW: 13.6 % (ref 11.5–15.5)
WBC: 13.4 10*3/uL — AB (ref 4.0–10.5)

## 2016-09-15 LAB — URINALYSIS, ROUTINE W REFLEX MICROSCOPIC
Bilirubin Urine: NEGATIVE
GLUCOSE, UA: NEGATIVE mg/dL
Hgb urine dipstick: NEGATIVE
KETONES UR: NEGATIVE mg/dL
LEUKOCYTES UA: NEGATIVE
Nitrite: NEGATIVE
Protein, ur: NEGATIVE mg/dL
Specific Gravity, Urine: 1.02 (ref 1.005–1.030)
pH: 5 (ref 5.0–8.0)

## 2016-09-15 LAB — COMPREHENSIVE METABOLIC PANEL
ALK PHOS: 55 U/L (ref 38–126)
ALT: 17 U/L (ref 14–54)
AST: 26 U/L (ref 15–41)
Albumin: 4.3 g/dL (ref 3.5–5.0)
Anion gap: 7 (ref 5–15)
BUN: 11 mg/dL (ref 6–20)
CALCIUM: 9.9 mg/dL (ref 8.9–10.3)
CHLORIDE: 105 mmol/L (ref 101–111)
CO2: 30 mmol/L (ref 22–32)
CREATININE: 0.72 mg/dL (ref 0.44–1.00)
GFR calc Af Amer: >60 mL/min (ref >60)
GFR calc non Af Amer: >60 mL/min (ref >60)
GLUCOSE: 120 mg/dL — AB (ref 65–99)
Potassium: 4.6 mmol/L (ref 3.5–5.1)
SODIUM: 142 mmol/L (ref 135–145)
Total Bilirubin: 0.5 mg/dL (ref 0.3–1.2)
Total Protein: 7.4 g/dL (ref 6.5–8.1)

## 2016-09-15 NOTE — ED Notes (Signed)
Patient called out stating she is starting to have pain in her chest. RN notified.

## 2016-09-15 NOTE — ED Triage Notes (Signed)
Patient c/o intermittent headaches x2 months with sinus pressure x3 weeks and confusion x2 weeks. Unsure of any blurred vision or facial drooping. Patient contributed confusion to recent stress due to going back to school and bible studies. Denies any weakness. Patient states "I just don't feel right."

## 2016-09-15 NOTE — ED Provider Notes (Signed)
[facility] DEPT Provider Note   CSN: [id number] Arrival date & time: 09/15/16  1131  By signing my name below, I, Cynthia Deleon, attest that this documentation has been prepared under the direction and in the presence of [name], MD.  Electronically Signed: [name], ED Scribe. 09/15/16. 12:15 PM.    History   Chief Complaint Chief Complaint  Patient presents with  . Altered Mental Status   The history is provided by the patient. No language interpreter was used.   HPI Comments: Cynthia Deleon is a 61 y.o. female who has a PMhx of bipolar 1 disorder, COPD, and HTN presents to the Emergency Department presenting with intermittent, persisting confusion for the past 2 weeks. Pt expresses she has been having associated intermittent headache for the past 2 months without intervention, increased fatigue, and weakness in her bilateral hands. pt says she started going back to school to get her GED 2 weeks ago. She expresses her symptoms started shortly after she started school. Pt says she has been under a lot of stress since starting school. She says "the back of my head feels weird, like confusion and slow motion". She states when she went to drive her car she felt as if something wasn't right after leaving school last week. She notes yesterday she was playing a video game yesterday when she leaned back and felt as if she wanted to go sleep. Pt notes she had another episode of confusion today at bible study when she didn't understand what was going on around her but had enough sense to not mention anything. Pt does not consume ETOH. She denies loss of appetite, shortness of breath, and abdominal pain. Pt is a smoker but notes she has not smoked recently since onset of symptoms.   Past Medical History:  Diagnosis Date  . Arthritis    hands and knees  . Bipolar 1 disorder (HCC) Degenerative disk disease  . Borderline diabetic   . Chronic back pain   . Chronic neck pain   .  Complication of anesthesia    high anxiety-does not want to be alone  . COPD (chronic obstructive pulmonary disease) ([place])   . Current use of estrogen therapy 01/12/2014  . Depression   . Headache   . Hot flashes 12/15/2013  . Hypertension   . IBS (irritable bowel syndrome)   . Incontinence   . Kidney stone   . Multiple personality disorder   . Panic attacks   . Peptic ulcer   . Peripheral neuropathy ([place])   . Rosacea   . Shingles   . Sleep apnea    uses a cpap-with oxygen    Patient Active Problem List   Diagnosis Date Noted  . Other abnormal glucose 04/17/2016  . OCD (obsessive compulsive disorder) 11/15/2015  . Tobacco use disorder 11/15/2015  . Peripheral neuropathy ([place]) 10/10/2015  . Current use of estrogen therapy 01/12/2014  . PTSD (post-traumatic stress disorder) 08/03/2011  . Bipolar I disorder, most recent episode mixed, severe with psychotic features [facility])     Past Surgical History:  Procedure Laterality Date  . ABDOMINAL HYSTERECTOMY    . BUNIONECTOMY WITH HAMMERTOE RECONSTRUCTION Right 12/10/2012   Procedure: RIGHT FIRST METATARSAL CHEVRON BUNION CORRECTION,  2 AND 3 HAMMERTOE CORRECTION , RIGHT 3 AND 4 TOE NAIL EXCISION ;  Surgeon: Wylene Simmer, MD;  Location: [facility];  Service: Orthopedics;  Laterality: Right;  . FOOT ARTHRODESIS  2000   both feet  . RECTAL SURGERY    .  TONSILLECTOMY      OB History    Gravida Para Term Preterm AB Living   5 5       5    SAB TAB Ectopic Multiple Live Births                   Home Medications    Prior to Admission medications   Medication Sig Start Date End Date Taking? Authorizing Provider  acetaminophen (TYLENOL) 500 MG tablet Take 1,000 mg by mouth every 6 (six) hours as needed for headache.   Yes Historical Provider, MD  albuterol (PROVENTIL HFA;VENTOLIN HFA) 108 (90 Base) MCG/ACT inhaler Inhale 2 puffs into the lungs every 4 (four) hours as needed for wheezing or shortness of breath. 06/06/15  Yes  Francine Graven, DO  diazepam (VALIUM) 5 MG tablet Take 1 tablet (5 mg total) by mouth every 8 (eight) hours as needed (vertigo). Patient taking differently: Take 5 mg by mouth 3 (three) times daily.  06/14/14  Yes Tanna Furry, MD  esomeprazole (NEXIUM) 40 MG capsule Take 40 mg by mouth 2 (two) times daily before a meal.    Yes Historical Provider, MD  estrogens, conjugated, (PREMARIN) 0.3 MG tablet Take 0.3 mg by mouth daily. Take daily for 21 days then do not take for 7 days.   Yes Historical Provider, MD  gabapentin (NEURONTIN) 300 MG capsule Take 1 capsule (300 mg total) by mouth 3 (three) times daily. 04/17/16  Yes Marcial Pacas, MD  hydrOXYzine (VISTARIL) 25 MG capsule Take 25 mg by mouth 3 (three) times daily as needed for anxiety.   Yes Historical Provider, MD  ketoconazole (NIZORAL) 2 % shampoo Apply 1 application topically 2 (two) times a week.   Yes Historical Provider, MD  mirabegron ER (MYRBETRIQ) 50 MG TB24 tablet Take 50 mg by mouth daily.   Yes Historical Provider, MD  Oxcarbazepine (TRILEPTAL) 300 MG tablet Take 1 tablet (300 mg total) by mouth 2 (two) times daily at 8 am and 10 pm. 11/22/15  Yes Jolanta B Pucilowska, MD  oxyCODONE (ROXICODONE) 15 MG immediate release tablet Take 7.5-15 mg by mouth 4 (four) times daily.    Yes Historical Provider, MD  pravastatin (PRAVACHOL) 40 MG tablet Take 40 mg by mouth daily. Reported on 06/06/2015   Yes Historical Provider, MD  tiotropium (SPIRIVA) 18 MCG inhalation capsule Place 18 mcg into inhaler and inhale daily.   Yes Historical Provider, MD  trazodone (DESYREL) 300 MG tablet Take 600 mg by mouth at bedtime as needed for sleep.   Yes Historical Provider, MD  Umeclidinium-Vilanterol (ANORO ELLIPTA) 62.5-25 MCG/INH AEPB Inhale 1 puff into the lungs daily.   Yes Historical Provider, MD  vitamin B-12 1000 MCG tablet Take 1 tablet (1,000 mcg total) by mouth daily with breakfast. 11/22/15  Yes Clovis Fredrickson, MD    Family History Family History    Problem Relation Age of Onset  . Diabetes Mother   . Other Mother     vertigo; chronic eye disease  . COPD Father   . COPD Sister   . Alcohol abuse Brother   . Diabetes Maternal Grandmother   . Diabetes Maternal Grandfather   . Diabetes Sister   . Other Sister     hearing problems  . Hyperlipidemia Sister   . Alcohol abuse Brother   . COPD Brother   . Alcohol abuse Brother   . Alcohol abuse Brother   . Other Brother     aneursym    Social  History Social History  Substance Use Topics  . Smoking status: Former Smoker    Packs/day: 0.50    Years: 35.00    Types: Cigarettes    Quit date: 10/19/2012  . Smokeless tobacco: Never Used  . Alcohol use No     Allergies   Chantix [varenicline]; Ambien [zolpidem]; Codeine; Divalproex sodium; Hydrocodone; Paxil [paroxetine hcl]; Sulfur; and Amoxicillin   Review of Systems Review of Systems  Constitutional: Positive for fatigue. Negative for appetite change.  Respiratory: Negative for shortness of breath.   Gastrointestinal: Negative for abdominal pain.  Neurological: Positive for weakness and headaches.  Psychiatric/Behavioral: Positive for confusion. Negative for suicidal ideas. The patient is nervous/anxious.   All other systems reviewed and are negative.    Physical Exam Updated Vital Signs BP 116/82   Pulse 64   Temp 97.6 F (36.4 C) (Oral)   Resp 13   Ht 5\' 9"  (1.753 m)   Wt 173 lb (78.5 kg)   SpO2 93%   BMI 25.55 kg/m   Physical Exam  Constitutional: She appears well-developed and well-nourished. No distress.  HENT:  Head: Normocephalic and atraumatic.  Mouth/Throat: Oropharynx is clear and moist. No oropharyngeal exudate.  Eyes: Conjunctivae and EOM are normal. Pupils are equal, round, and reactive to light. Right eye exhibits no discharge. Left eye exhibits no discharge. No scleral icterus.  Neck: Normal range of motion. Neck supple. No JVD present. No thyromegaly present.  Cardiovascular: Normal rate,  regular rhythm, normal heart sounds and intact distal pulses.  Exam reveals no gallop and no friction rub.   No murmur heard. Pulmonary/Chest: Effort normal and breath sounds normal. No respiratory distress. She has no wheezes. She has no rales.  Abdominal: Soft. Bowel sounds are normal. She exhibits no distension and no mass. There is no tenderness.  Musculoskeletal: Normal range of motion. She exhibits no edema or tenderness.  Lymphadenopathy:    She has no cervical adenopathy.  Neurological: She is alert. Coordination normal.  Neurologic exam:  Speech clear, pupils equal round reactive to light, extraocular movements intact  Normal peripheral visual fields Cranial nerves III through XII normal including no facial droop Follows commands, moves all extremities x4, normal strength to bilateral upper and lower extremities at all major muscle groups including grip Sensation normal to light touch and pinprick Coordination intact, no limb ataxia, finger-nose-finger normal Rapid alternating movements normal No pronator drift Gait normal   Skin: Skin is warm and dry. No rash noted. No erythema.  Psychiatric: She has a normal mood and affect. Her behavior is normal.  teaful  Nursing note and vitals reviewed.    ED Treatments / Results  DIAGNOSTIC STUDIES: Oxygen Saturation is 98% on RA, normal by my interpretation.  COORDINATION OF CARE:  12:20 PM Discussed treatment plan with pt at bedside and pt agreed to plan.  Labs (all labs ordered are listed, but only abnormal results are displayed) Labs Reviewed  CBC WITH DIFFERENTIAL/PLATELET - Abnormal; Notable for the following:       Result Value   WBC 13.4 (*)    Hemoglobin 15.6 (*)    HCT 46.3 (*)    Neutro Abs 9.8 (*)    All other components within normal limits  COMPREHENSIVE METABOLIC PANEL - Abnormal; Notable for the following:    Glucose, Bld 120 (*)    All other components within normal limits  URINALYSIS, ROUTINE W REFLEX  MICROSCOPIC - Abnormal; Notable for the following:    APPearance HAZY (*)  All other components within normal limits    EKG  EKG Interpretation  Date/Time:  Sunday September 15 2016 11:45:33 EDT Ventricular Rate:  83 PR Interval:    QRS Duration: 87 QT Interval:  365 QTC Calculation: 429 R Axis:   56 Text Interpretation:  Sinus rhythm Abnormal R-wave progression, early transition Baseline wander in lead(s) V3 ECG OTHERWISE WITHIN NORMAL LIMITS since last tracing no significant change Confirmed by Daisja Kessinger  MD, Sussie Minor ([id number]) on 09/15/2016 12:41:51 PM       Radiology Ct Head Wo Contrast  Result Date: 09/15/2016 CLINICAL DATA:  Confusion, fatigue EXAM: CT HEAD WITHOUT CONTRAST TECHNIQUE: Contiguous axial images were obtained from the base of the skull through the vertex without intravenous contrast. COMPARISON:  06/14/2014 FINDINGS: Brain: No evidence of acute infarction, hemorrhage, hydrocephalus, extra-axial collection or mass lesion/mass effect. Vascular: Mild intracranial atherosclerosis. Skull: Normal. Negative for fracture or focal lesion. Sinuses/Orbits: The visualized paranasal sinuses are essentially clear. The mastoid air cells are unopacified. Other: None. IMPRESSION: Normal head CT. Electronically Signed   By: Julian Hy M.D.   On: 09/15/2016 13:27    Procedures Procedures (including critical care time)  Medications Ordered in ED Medications - No data to display   Initial Impression / Assessment and Plan / ED Course  I have reviewed the triage vital signs and the nursing notes.  Pertinent labs & imaging results that were available during my care of the patient were reviewed by me and considered in my medical decision making (see chart for details).  Clinical Course as of Sep 16 1503  Nancy Fetter Sep 15, 2016  1504 Pt informed of all her results  She is less anxious since family arrived She requested that I ask them to help out around the house more She is not tearful, she  has a vague sense of confusion but is answering all of my questions without difficulty - she has normal VS and is well appearing,  she was informed of her isolated leukocytosis - no other acute findings on labs or exam, stable for d/c - she will f/u with Dr. Doyle Askew in the clinic this week.    [BM]    Clinical Course User Index [BM] [name], MD     Final Clinical Impressions(s) / ED Diagnoses   Final diagnoses:  Stress reaction   I personally performed the services described in this documentation, which was scribed in my presence. The recorded information has been reviewed and is accurate.    New Prescriptions New Prescriptions   No medications on file     [name], MD 09/15/16 1506

## 2016-09-23 DIAGNOSIS — G4733 Obstructive sleep apnea (adult) (pediatric): Secondary | ICD-10-CM | POA: Diagnosis not present

## 2016-09-25 DIAGNOSIS — S9032XA Contusion of left foot, initial encounter: Secondary | ICD-10-CM | POA: Diagnosis not present

## 2016-09-25 DIAGNOSIS — R350 Frequency of micturition: Secondary | ICD-10-CM | POA: Diagnosis not present

## 2016-09-25 DIAGNOSIS — J309 Allergic rhinitis, unspecified: Secondary | ICD-10-CM | POA: Diagnosis not present

## 2016-10-08 DIAGNOSIS — G4733 Obstructive sleep apnea (adult) (pediatric): Secondary | ICD-10-CM | POA: Diagnosis not present

## 2016-10-08 DIAGNOSIS — J449 Chronic obstructive pulmonary disease, unspecified: Secondary | ICD-10-CM | POA: Diagnosis not present

## 2016-10-30 ENCOUNTER — Telehealth: Payer: Self-pay | Admitting: Neurology

## 2016-10-30 DIAGNOSIS — L97511 Non-pressure chronic ulcer of other part of right foot limited to breakdown of skin: Secondary | ICD-10-CM | POA: Diagnosis not present

## 2016-10-30 DIAGNOSIS — R300 Dysuria: Secondary | ICD-10-CM | POA: Diagnosis not present

## 2016-10-30 NOTE — Telephone Encounter (Signed)
Spoke to pt over the phone, she has requested to stop taking the gabapentin due to side effects. Was wondering if Lyrica would be a good option as she's been on that before and had success.

## 2016-10-30 NOTE — Telephone Encounter (Signed)
Left message for a return call

## 2016-10-30 NOTE — Telephone Encounter (Signed)
Spoke to patient - she has already stopped her gabapentin and is willing to come in for an appt to discuss her medications.  She is only available for morning appts and the first available time that worked with her schedule was 11/26/16.  She was satisfied with this date.

## 2016-10-30 NOTE — Telephone Encounter (Signed)
Chart reviewed patient with history of bipolar disorder, polypharmacy treatment, and complains of bilateral lower extremity paresthesia, EMG nerve conduction study showed no large fiber peripheral neuropathy, skin biopsy in March 2018 showed evidence of small fiber neuropathy, Laboratory evaluation showed no etiology, She is on polypharmacy treatment including trazodone 300 mg 2 tablets every night, oxycodone 15 mg 4 times a day, Trileptal 300 mg twice a day, myrbetriq 50 mg every day, gabapentin 300 mg 3 times a day, Valium 5 mg 3 times a day,  It is okay to stop gabapentin is causing her trouble, she is already on polypharmacy, had before her to be seen by nurse practitioner to start any new medications

## 2016-11-08 DIAGNOSIS — J449 Chronic obstructive pulmonary disease, unspecified: Secondary | ICD-10-CM | POA: Diagnosis not present

## 2016-11-08 DIAGNOSIS — G4733 Obstructive sleep apnea (adult) (pediatric): Secondary | ICD-10-CM | POA: Diagnosis not present

## 2016-11-15 ENCOUNTER — Ambulatory Visit: Payer: Medicaid Other | Admitting: Podiatry

## 2016-11-20 ENCOUNTER — Emergency Department (HOSPITAL_COMMUNITY): Payer: BLUE CROSS/BLUE SHIELD

## 2016-11-20 ENCOUNTER — Emergency Department (HOSPITAL_COMMUNITY)
Admission: EM | Admit: 2016-11-20 | Discharge: 2016-11-20 | Disposition: A | Discharge status: Home / Self Care | Payer: BLUE CROSS/BLUE SHIELD | Attending: Emergency Medicine | Admitting: Emergency Medicine

## 2016-11-20 ENCOUNTER — Encounter (HOSPITAL_COMMUNITY): Payer: Self-pay | Admitting: *Deleted

## 2016-11-20 DIAGNOSIS — R1013 Epigastric pain: Secondary | ICD-10-CM | POA: Diagnosis not present

## 2016-11-20 DIAGNOSIS — M25559 Pain in unspecified hip: Secondary | ICD-10-CM | POA: Diagnosis not present

## 2016-11-20 DIAGNOSIS — R1900 Intra-abdominal and pelvic swelling, mass and lump, unspecified site: Secondary | ICD-10-CM | POA: Diagnosis not present

## 2016-11-20 DIAGNOSIS — Z87891 Personal history of nicotine dependence: Secondary | ICD-10-CM | POA: Insufficient documentation

## 2016-11-20 DIAGNOSIS — J449 Chronic obstructive pulmonary disease, unspecified: Secondary | ICD-10-CM | POA: Diagnosis not present

## 2016-11-20 DIAGNOSIS — I10 Essential (primary) hypertension: Secondary | ICD-10-CM | POA: Diagnosis not present

## 2016-11-20 DIAGNOSIS — R1010 Upper abdominal pain, unspecified: Secondary | ICD-10-CM

## 2016-11-20 DIAGNOSIS — R140 Abdominal distension (gaseous): Secondary | ICD-10-CM | POA: Diagnosis not present

## 2016-11-20 DIAGNOSIS — R109 Unspecified abdominal pain: Secondary | ICD-10-CM | POA: Diagnosis not present

## 2016-11-20 DIAGNOSIS — Z79899 Other long term (current) drug therapy: Secondary | ICD-10-CM | POA: Insufficient documentation

## 2016-11-20 LAB — COMPREHENSIVE METABOLIC PANEL
ALT: 12 U/L — AB (ref 14–54)
AST: 19 U/L (ref 15–41)
Albumin: 3.9 g/dL (ref 3.5–5.0)
Alkaline Phosphatase: 56 U/L (ref 38–126)
Anion gap: 11 (ref 5–15)
BUN: 11 mg/dL (ref 6–20)
CALCIUM: 9.2 mg/dL (ref 8.9–10.3)
CO2: 25 mmol/L (ref 22–32)
CREATININE: 1.01 mg/dL — AB (ref 0.44–1.00)
Chloride: 104 mmol/L (ref 101–111)
GFR calc non Af Amer: 59 mL/min — ABNORMAL LOW (ref >60)
GLUCOSE: 146 mg/dL — AB (ref 65–99)
Potassium: 3.9 mmol/L (ref 3.5–5.1)
SODIUM: 140 mmol/L (ref 135–145)
Total Bilirubin: 0.6 mg/dL (ref 0.3–1.2)
Total Protein: 7 g/dL (ref 6.5–8.1)

## 2016-11-20 LAB — CBC
HCT: 45.8 % (ref 36.0–46.0)
Hemoglobin: 15.4 g/dL — ABNORMAL HIGH (ref 12.0–15.0)
MCH: 32.4 pg (ref 26.0–34.0)
MCHC: 33.6 g/dL (ref 30.0–36.0)
MCV: 96.4 fL (ref 78.0–100.0)
Platelets: 226 10*3/uL (ref 150–400)
RBC: 4.75 MIL/uL (ref 3.87–5.11)
RDW: 12.7 % (ref 11.5–15.5)
WBC: 8.8 10*3/uL (ref 4.0–10.5)

## 2016-11-20 LAB — LIPASE, BLOOD: Lipase: 42 U/L (ref 11–51)

## 2016-11-20 MED ORDER — SUCRALFATE 1 G PO TABS: 1.0000 g | ORAL_TABLET | Freq: Three times a day (TID) | ORAL | 1 refills | Status: DC

## 2016-11-20 MED ORDER — IOPAMIDOL (ISOVUE-300) INJECTION 61%
100.0000 mL | Freq: Once | INTRAVENOUS | Status: AC | PRN
  Administered 2016-11-20: 100 mL via INTRAVENOUS

## 2016-11-20 NOTE — Discharge Instructions (Signed)
Follow up with your md as scheduled tomorrow

## 2016-11-20 NOTE — ED Triage Notes (Addendum)
Pt c/o abd pain, swelling that started two weeks ago  With nausea, pt states that she has "ulcers" and is wanting a second opinion due to continued pain. [facility] appointment [facility],

## 2016-11-20 NOTE — ED Provider Notes (Signed)
[facility] DEPT Provider Note   CSN: [id number] Arrival date & time: 11/20/16  1907     History   Chief Complaint Chief Complaint  Patient presents with  . Abdominal Pain    HPI Cynthia Deleon is a 61 y.o. female.  Patient complains of abdominal distention and pain for weeks.   The history is provided by the patient. No language interpreter was used.  Abdominal Pain   This is a new problem. The current episode started more than 1 week ago. The problem occurs constantly. The problem has not changed since onset.The pain is associated with an unknown factor. The pain is located in the epigastric region. The pain is at a severity of 4/10. Pertinent negatives include anorexia, diarrhea, frequency, hematuria and headaches.    Past Medical History:  Diagnosis Date  . Arthritis    hands and knees  . Bipolar 1 disorder (HCC) Degenerative disk disease  . Borderline diabetic   . Chronic back pain   . Chronic neck pain   . Complication of anesthesia    high anxiety-does not want to be alone  . COPD (chronic obstructive pulmonary disease) ([place])   . Current use of estrogen therapy 01/12/2014  . Depression   . Headache   . Hot flashes 12/15/2013  . Hypertension   . IBS (irritable bowel syndrome)   . Incontinence   . Kidney stone   . Multiple personality disorder   . Panic attacks   . Peptic ulcer   . Peripheral neuropathy   . Rosacea   . Shingles   . Sleep apnea    uses a cpap-with oxygen    Patient Active Problem List   Diagnosis Date Noted  . Other abnormal glucose 04/17/2016  . OCD (obsessive compulsive disorder) 11/15/2015  . Tobacco use disorder 11/15/2015  . Peripheral neuropathy 10/10/2015  . Current use of estrogen therapy 01/12/2014  . PTSD (post-traumatic stress disorder) 08/03/2011  . Bipolar I disorder, most recent episode mixed, severe with psychotic features [facility])     Past Surgical History:  Procedure Laterality Date  . ABDOMINAL HYSTERECTOMY      . BUNIONECTOMY WITH HAMMERTOE RECONSTRUCTION Right 12/10/2012   Procedure: RIGHT FIRST METATARSAL CHEVRON BUNION CORRECTION,  2 AND 3 HAMMERTOE CORRECTION , RIGHT 3 AND 4 TOE NAIL EXCISION ;  Surgeon: Wylene Simmer, MD;  Location: [facility];  Service: Orthopedics;  Laterality: Right;  . FOOT ARTHRODESIS  2000   both feet  . RECTAL SURGERY    . TONSILLECTOMY      OB History    Gravida Para Term Preterm AB Living   5 5       5    SAB TAB Ectopic Multiple Live Births                   Home Medications    Prior to Admission medications   Medication Sig Start Date End Date Taking? Authorizing Provider  acetaminophen (TYLENOL) 500 MG tablet Take 1,000 mg by mouth every 6 (six) hours as needed for headache.   Yes [provider]  albuterol (PROVENTIL HFA;VENTOLIN HFA) 108 (90 Base) MCG/ACT inhaler Inhale 2 puffs into the lungs every 4 (four) hours as needed for wheezing or shortness of breath. 06/06/15  Yes Francine Graven, DO  diazepam (VALIUM) 5 MG tablet Take 1 tablet (5 mg total) by mouth every 8 (eight) hours as needed (vertigo). Patient taking differently: Take 5 mg by mouth 3 (three) times daily.  06/14/14  Yes Tanna Furry, MD  esomeprazole (NEXIUM) 40 MG capsule Take 40 mg by mouth 2 (two) times daily before a meal.    Yes [provider]  estrogens, conjugated, (PREMARIN) 0.3 MG tablet Take 0.3 mg by mouth daily. Take daily for 21 days then do not take for 7 days.   Yes [provider]  hydrOXYzine (VISTARIL) 25 MG capsule Take 25 mg by mouth 3 (three) times daily as needed for anxiety.   Yes [provider]  ketoconazole (NIZORAL) 2 % shampoo Apply 1 application topically 2 (two) times a week.   Yes [provider]  LYRICA 150 MG capsule Take 150 mg by mouth 2 (two) times daily.  11/06/16  Yes [provider]  mirabegron ER (MYRBETRIQ) 50 MG TB24 tablet Take 50 mg by mouth daily.   Yes [provider]   Oxcarbazepine (TRILEPTAL) 300 MG tablet Take 1 tablet (300 mg total) by mouth 2 (two) times daily at 8 am and 10 pm. 11/22/15  Yes Pucilowska, Jolanta B, MD  oxyCODONE (ROXICODONE) 15 MG immediate release tablet Take 7.5-15 mg by mouth 4 (four) times daily.    Yes [provider]  pravastatin (PRAVACHOL) 40 MG tablet Take 40 mg by mouth daily. Reported on 06/06/2015   Yes [provider]  simethicone (MYLICON) 80 MG chewable tablet Chew 80 mg by mouth every 6 (six) hours as needed for flatulence.   Yes [provider]  tiotropium (SPIRIVA) 18 MCG inhalation capsule Place 18 mcg into inhaler and inhale daily.   Yes [provider]  trazodone (DESYREL) 300 MG tablet Take 600 mg by mouth at bedtime as needed for sleep.   Yes [provider]  Umeclidinium-Vilanterol (ANORO ELLIPTA) 62.5-25 MCG/INH AEPB Inhale 1 puff into the lungs daily.   Yes [provider]  vitamin B-12 1000 MCG tablet Take 1 tablet (1,000 mcg total) by mouth daily with breakfast. 11/22/15  Yes Pucilowska, Jolanta B, MD  sucralfate (CARAFATE) 1 g tablet Take 1 tablet (1 g total) by mouth 4 (four) times daily -  with meals and at bedtime. 11/20/16   Milton Ferguson, MD    Family History Family History  Problem Relation Age of Onset  . Diabetes Mother   . Other Mother        vertigo; chronic eye disease  . COPD Father   . COPD Sister   . Alcohol abuse Brother   . Diabetes Maternal Grandmother   . Diabetes Maternal Grandfather   . Diabetes Sister   . Other Sister        hearing problems  . Hyperlipidemia Sister   . Alcohol abuse Brother   . COPD Brother   . Alcohol abuse Brother   . Alcohol abuse Brother   . Other Brother        aneursym    Social History Social History  Substance Use Topics  . Smoking status: Former Smoker    Packs/day: 0.50    Years: 35.00    Types: Cigarettes    Quit date: 10/19/2012  . Smokeless tobacco: Never Used  . Alcohol use No      Allergies   Chantix [varenicline]; Ambien [zolpidem]; Codeine; Divalproex sodium; Hydrocodone; Paxil [paroxetine hcl]; Sulfur; and Amoxicillin   Review of Systems Review of Systems  Constitutional: Negative for appetite change and fatigue.  HENT: Negative for congestion, ear discharge and sinus pressure.   Eyes: Negative for discharge.  Respiratory: Negative for cough.   Cardiovascular: Negative  for chest pain.  Gastrointestinal: Positive for abdominal pain. Negative for anorexia and diarrhea.  Genitourinary: Negative for frequency and hematuria.  Musculoskeletal: Negative for back pain.  Skin: Negative for rash.  Neurological: Negative for seizures and headaches.  Psychiatric/Behavioral: Negative for hallucinations.     Physical Exam Updated Vital Signs BP (!) 158/75   Pulse 79   Temp 98.1 F (36.7 C) (Oral)   Resp 20   Ht 5\' 9"  (1.753 m)   Wt 80 kg (176 lb 6 oz)   SpO2 97%   BMI 26.05 kg/m   Physical Exam  Constitutional: She is oriented to person, place, and time. She appears well-developed.  HENT:  Head: Normocephalic.  Eyes: Conjunctivae and EOM are normal. No scleral icterus.  Neck: Neck supple. No thyromegaly present.  Cardiovascular: Normal rate and regular rhythm.  Exam reveals no gallop and no friction rub.   No murmur heard. Pulmonary/Chest: No stridor. She has no wheezes. She has no rales. She exhibits no tenderness.  Abdominal: She exhibits distension. There is tenderness. There is no rebound.  Musculoskeletal: Normal range of motion. She exhibits no edema.  Lymphadenopathy:    She has no cervical adenopathy.  Neurological: She is oriented to person, place, and time. She exhibits normal muscle tone. Coordination normal.  Skin: No rash noted. No erythema.  Psychiatric: She has a normal mood and affect. Her behavior is normal.     ED Treatments / Results  Labs (all labs ordered are listed, but only abnormal results are displayed) Labs Reviewed   COMPREHENSIVE METABOLIC PANEL - Abnormal; Notable for the following:       Result Value   Glucose, Bld 146 (*)    Creatinine, Ser 1.01 (*)    ALT 12 (*)    GFR calc non Af Amer 59 (*)    All other components within normal limits  CBC - Abnormal; Notable for the following:    Hemoglobin 15.4 (*)    All other components within normal limits  LIPASE, BLOOD    EKG  EKG Interpretation None       Radiology Ct Abdomen Pelvis W Contrast  Result Date: 11/20/2016 CLINICAL DATA:  61 year old female with abdominal and pelvic pain and swelling for 2 weeks. EXAM: CT ABDOMEN AND PELVIS WITH CONTRAST TECHNIQUE: Multidetector CT imaging of the abdomen and pelvis was performed using the standard protocol following bolus administration of intravenous contrast. CONTRAST:  119mL ISOVUE-300 IOPAMIDOL (ISOVUE-300) INJECTION 61% COMPARISON:  04/03/2016 CT FINDINGS: Lower chest: No acute abnormality Hepatobiliary: The liver and gallbladder are unremarkable. There is no evidence of biliary dilatation. Pancreas: Unremarkable Spleen: Unremarkable Adrenals/Urinary Tract: The kidneys, adrenal glands and bladder are unremarkable except for small renal cysts. Stomach/Bowel: Stomach is within normal limits. Appendix appears normal. No evidence of bowel wall thickening, distention, or inflammatory changes. Vascular/Lymphatic: Aortic atherosclerosis. No enlarged abdominal or pelvic lymph nodes. Reproductive: Status post hysterectomy. No adnexal masses. Other: No free fluid, abscess or pneumoperitoneum. Musculoskeletal: Degenerative disc disease at L5-S1 again noted. IMPRESSION: No evidence of acute abnormality. Aortic Atherosclerosis (ICD10-I70.0). Electronically Signed   By: Margarette Canada M.D.   On: 11/20/2016 21:14    Procedures Procedures (including critical care time)  Medications Ordered in ED Medications  iopamidol (ISOVUE-300) 61 % injection 100 mL (100 mLs Intravenous Contrast Given 11/20/16 2047)     Initial  Impression / Assessment and Plan / ED Course  I have reviewed the triage vital signs and the nursing notes.  Pertinent labs & imaging  results that were available during my care of the patient were reviewed by me and considered in my medical decision making (see chart for details).     Patient with abdominal pain and distention. CT scan unremarkable. I will add Carafate to her regimen and she will follow-up with her doctor tomorrow  Final Clinical Impressions(s) / ED Diagnoses   Final diagnoses:  Pain of upper abdomen    New Prescriptions New Prescriptions   SUCRALFATE (CARAFATE) 1 G TABLET    Take 1 tablet (1 g total) by mouth 4 (four) times daily -  with meals and at bedtime.     Milton Ferguson, MD 11/20/16 2250

## 2016-11-21 DIAGNOSIS — K589 Irritable bowel syndrome without diarrhea: Secondary | ICD-10-CM | POA: Diagnosis not present

## 2016-11-21 DIAGNOSIS — K219 Gastro-esophageal reflux disease without esophagitis: Secondary | ICD-10-CM | POA: Diagnosis not present

## 2016-11-21 DIAGNOSIS — R140 Abdominal distension (gaseous): Secondary | ICD-10-CM | POA: Diagnosis not present

## 2016-11-21 DIAGNOSIS — R1084 Generalized abdominal pain: Secondary | ICD-10-CM | POA: Diagnosis not present

## 2016-11-26 ENCOUNTER — Ambulatory Visit: Payer: Self-pay | Admitting: Nurse Practitioner

## 2016-12-03 ENCOUNTER — Ambulatory Visit: Payer: Self-pay | Admitting: Neurology

## 2016-12-03 DIAGNOSIS — R21 Rash and other nonspecific skin eruption: Secondary | ICD-10-CM | POA: Diagnosis not present

## 2016-12-03 DIAGNOSIS — R300 Dysuria: Secondary | ICD-10-CM | POA: Diagnosis not present

## 2016-12-04 DIAGNOSIS — F319 Bipolar disorder, unspecified: Secondary | ICD-10-CM | POA: Diagnosis not present

## 2016-12-04 DIAGNOSIS — F5101 Primary insomnia: Secondary | ICD-10-CM | POA: Diagnosis not present

## 2016-12-04 DIAGNOSIS — G4733 Obstructive sleep apnea (adult) (pediatric): Secondary | ICD-10-CM | POA: Diagnosis not present

## 2016-12-04 DIAGNOSIS — R413 Other amnesia: Secondary | ICD-10-CM | POA: Diagnosis not present

## 2016-12-08 DIAGNOSIS — G4733 Obstructive sleep apnea (adult) (pediatric): Secondary | ICD-10-CM | POA: Diagnosis not present

## 2016-12-08 DIAGNOSIS — J449 Chronic obstructive pulmonary disease, unspecified: Secondary | ICD-10-CM | POA: Diagnosis not present

## 2016-12-27 DIAGNOSIS — M5416 Radiculopathy, lumbar region: Secondary | ICD-10-CM | POA: Diagnosis not present

## 2016-12-27 DIAGNOSIS — M545 Low back pain: Secondary | ICD-10-CM | POA: Diagnosis not present

## 2016-12-27 DIAGNOSIS — M542 Cervicalgia: Secondary | ICD-10-CM | POA: Diagnosis not present

## 2016-12-27 DIAGNOSIS — M5117 Intervertebral disc disorders with radiculopathy, lumbosacral region: Secondary | ICD-10-CM | POA: Diagnosis not present

## 2016-12-27 DIAGNOSIS — M5412 Radiculopathy, cervical region: Secondary | ICD-10-CM | POA: Diagnosis not present

## 2017-01-06 DIAGNOSIS — G4733 Obstructive sleep apnea (adult) (pediatric): Secondary | ICD-10-CM | POA: Diagnosis not present

## 2017-01-08 DIAGNOSIS — G4733 Obstructive sleep apnea (adult) (pediatric): Secondary | ICD-10-CM | POA: Diagnosis not present

## 2017-01-08 DIAGNOSIS — R300 Dysuria: Secondary | ICD-10-CM | POA: Diagnosis not present

## 2017-01-08 DIAGNOSIS — R8290 Unspecified abnormal findings in urine: Secondary | ICD-10-CM | POA: Diagnosis not present

## 2017-01-08 DIAGNOSIS — J449 Chronic obstructive pulmonary disease, unspecified: Secondary | ICD-10-CM | POA: Diagnosis not present

## 2017-01-17 DIAGNOSIS — R5382 Chronic fatigue, unspecified: Secondary | ICD-10-CM | POA: Diagnosis not present

## 2017-01-17 DIAGNOSIS — M791 Myalgia: Secondary | ICD-10-CM | POA: Diagnosis not present

## 2017-01-17 DIAGNOSIS — R0602 Shortness of breath: Secondary | ICD-10-CM | POA: Diagnosis not present

## 2017-01-17 DIAGNOSIS — J449 Chronic obstructive pulmonary disease, unspecified: Secondary | ICD-10-CM | POA: Diagnosis not present

## 2017-01-17 DIAGNOSIS — R300 Dysuria: Secondary | ICD-10-CM | POA: Diagnosis not present

## 2017-01-29 DIAGNOSIS — N3941 Urge incontinence: Secondary | ICD-10-CM | POA: Diagnosis not present

## 2017-02-04 ENCOUNTER — Ambulatory Visit: Payer: Self-pay | Admitting: Neurology

## 2017-02-08 DIAGNOSIS — J449 Chronic obstructive pulmonary disease, unspecified: Secondary | ICD-10-CM | POA: Diagnosis not present

## 2017-02-08 DIAGNOSIS — G4733 Obstructive sleep apnea (adult) (pediatric): Secondary | ICD-10-CM | POA: Diagnosis not present

## 2017-02-18 DIAGNOSIS — R1030 Lower abdominal pain, unspecified: Secondary | ICD-10-CM | POA: Diagnosis not present

## 2017-02-18 DIAGNOSIS — R140 Abdominal distension (gaseous): Secondary | ICD-10-CM | POA: Diagnosis not present

## 2017-02-18 DIAGNOSIS — E782 Mixed hyperlipidemia: Secondary | ICD-10-CM | POA: Diagnosis not present

## 2017-02-18 DIAGNOSIS — I10 Essential (primary) hypertension: Secondary | ICD-10-CM | POA: Diagnosis not present

## 2017-02-18 DIAGNOSIS — Z23 Encounter for immunization: Secondary | ICD-10-CM | POA: Diagnosis not present

## 2017-02-18 DIAGNOSIS — Z0000 Encounter for general adult medical examination without abnormal findings: Secondary | ICD-10-CM | POA: Diagnosis not present

## 2017-02-18 DIAGNOSIS — N3281 Overactive bladder: Secondary | ICD-10-CM | POA: Diagnosis not present

## 2017-02-18 DIAGNOSIS — R102 Pelvic and perineal pain: Secondary | ICD-10-CM | POA: Diagnosis not present

## 2017-02-18 DIAGNOSIS — F319 Bipolar disorder, unspecified: Secondary | ICD-10-CM | POA: Diagnosis not present

## 2017-02-28 DIAGNOSIS — L718 Other rosacea: Secondary | ICD-10-CM | POA: Diagnosis not present

## 2017-03-07 DIAGNOSIS — G44319 Acute post-traumatic headache, not intractable: Secondary | ICD-10-CM | POA: Diagnosis not present

## 2017-03-10 DIAGNOSIS — J449 Chronic obstructive pulmonary disease, unspecified: Secondary | ICD-10-CM | POA: Diagnosis not present

## 2017-03-10 DIAGNOSIS — G4733 Obstructive sleep apnea (adult) (pediatric): Secondary | ICD-10-CM | POA: Diagnosis not present

## 2017-03-12 ENCOUNTER — Other Ambulatory Visit: Payer: Self-pay | Admitting: Family Medicine

## 2017-03-12 DIAGNOSIS — Z1231 Encounter for screening mammogram for malignant neoplasm of breast: Secondary | ICD-10-CM

## 2017-03-13 DIAGNOSIS — R1030 Lower abdominal pain, unspecified: Secondary | ICD-10-CM | POA: Diagnosis not present

## 2017-03-13 DIAGNOSIS — R140 Abdominal distension (gaseous): Secondary | ICD-10-CM | POA: Diagnosis not present

## 2017-03-13 DIAGNOSIS — R1032 Left lower quadrant pain: Secondary | ICD-10-CM | POA: Diagnosis not present

## 2017-03-13 DIAGNOSIS — K589 Irritable bowel syndrome without diarrhea: Secondary | ICD-10-CM | POA: Diagnosis not present

## 2017-03-17 DIAGNOSIS — N3941 Urge incontinence: Secondary | ICD-10-CM | POA: Diagnosis not present

## 2017-03-18 DIAGNOSIS — N3941 Urge incontinence: Secondary | ICD-10-CM | POA: Diagnosis not present

## 2017-03-26 DIAGNOSIS — F3162 Bipolar disorder, current episode mixed, moderate: Secondary | ICD-10-CM | POA: Diagnosis not present

## 2017-03-26 DIAGNOSIS — F4312 Post-traumatic stress disorder, chronic: Secondary | ICD-10-CM | POA: Diagnosis not present

## 2017-03-26 DIAGNOSIS — F410 Panic disorder [episodic paroxysmal anxiety] without agoraphobia: Secondary | ICD-10-CM | POA: Diagnosis not present

## 2017-04-02 DIAGNOSIS — R300 Dysuria: Secondary | ICD-10-CM | POA: Diagnosis not present

## 2017-04-10 DIAGNOSIS — G4733 Obstructive sleep apnea (adult) (pediatric): Secondary | ICD-10-CM | POA: Diagnosis not present

## 2017-04-10 DIAGNOSIS — M542 Cervicalgia: Secondary | ICD-10-CM | POA: Diagnosis not present

## 2017-04-10 DIAGNOSIS — M5412 Radiculopathy, cervical region: Secondary | ICD-10-CM | POA: Diagnosis not present

## 2017-04-10 DIAGNOSIS — J449 Chronic obstructive pulmonary disease, unspecified: Secondary | ICD-10-CM | POA: Diagnosis not present

## 2017-04-10 DIAGNOSIS — M5416 Radiculopathy, lumbar region: Secondary | ICD-10-CM | POA: Diagnosis not present

## 2017-04-10 DIAGNOSIS — M545 Low back pain: Secondary | ICD-10-CM | POA: Diagnosis not present

## 2017-04-16 DIAGNOSIS — L570 Actinic keratosis: Secondary | ICD-10-CM | POA: Diagnosis not present

## 2017-04-16 DIAGNOSIS — L219 Seborrheic dermatitis, unspecified: Secondary | ICD-10-CM | POA: Diagnosis not present

## 2017-04-16 DIAGNOSIS — L710 Perioral dermatitis: Secondary | ICD-10-CM | POA: Diagnosis not present

## 2017-04-23 ENCOUNTER — Ambulatory Visit (HOSPITAL_COMMUNITY): Payer: Self-pay

## 2017-04-30 DIAGNOSIS — N3941 Urge incontinence: Secondary | ICD-10-CM | POA: Diagnosis not present

## 2017-04-30 DIAGNOSIS — G4733 Obstructive sleep apnea (adult) (pediatric): Secondary | ICD-10-CM | POA: Diagnosis not present

## 2017-05-05 ENCOUNTER — Ambulatory Visit (HOSPITAL_COMMUNITY): Payer: Self-pay

## 2017-05-10 DIAGNOSIS — G4733 Obstructive sleep apnea (adult) (pediatric): Secondary | ICD-10-CM | POA: Diagnosis not present

## 2017-05-10 DIAGNOSIS — J449 Chronic obstructive pulmonary disease, unspecified: Secondary | ICD-10-CM | POA: Diagnosis not present

## 2017-05-14 ENCOUNTER — Other Ambulatory Visit: Payer: Self-pay | Admitting: Family Medicine

## 2017-05-14 ENCOUNTER — Ambulatory Visit (HOSPITAL_COMMUNITY)
Admission: RE | Admit: 2017-05-14 | Discharge: 2017-05-14 | Disposition: A | Discharge status: Home / Self Care | Payer: BLUE CROSS/BLUE SHIELD | Source: Ambulatory Visit | Attending: Family Medicine | Admitting: Family Medicine

## 2017-05-14 DIAGNOSIS — Z1231 Encounter for screening mammogram for malignant neoplasm of breast: Secondary | ICD-10-CM

## 2017-05-15 DIAGNOSIS — R140 Abdominal distension (gaseous): Secondary | ICD-10-CM | POA: Diagnosis not present

## 2017-05-15 DIAGNOSIS — K589 Irritable bowel syndrome without diarrhea: Secondary | ICD-10-CM | POA: Diagnosis not present

## 2017-05-15 NOTE — Congregational Nurse Program (Signed)
Congregational Nurse Program Note  Date of Encounter: 05/15/2017  Past Medical History: Past Medical History:  Diagnosis Date  . Arthritis    hands and knees  . Bipolar 1 disorder (HCC) Degenerative disk disease  . Borderline diabetic   . Chronic back pain   . Chronic neck pain   . Complication of anesthesia    high anxiety-does not want to be alone  . COPD (chronic obstructive pulmonary disease) ([place])   . Current use of estrogen therapy 01/12/2014  . Depression   . Headache   . Hot flashes 12/15/2013  . Hypertension   . IBS (irritable bowel syndrome)   . Incontinence   . Kidney stone   . Multiple personality disorder   . Panic attacks   . Peptic ulcer   . Peripheral neuropathy   . Rosacea   . Shingles   . Sleep apnea    uses a cpap-with oxygen    Encounter Details: CNP Questionnaire - 05/15/17 0141      Questionnaire   Patient Status  Not Applicable    Race  White or Caucasian    Location Patient Served At  Not Applicable 30/09   23/30   [place]    Uninsured  Not Applicable    Food  Yes, have food insecurities    Housing/Utilities  Yes, have permanent housing    Transportation  No transportation needs    Interpersonal Safety  Yes, feel physically and emotionally safe where you currently live    Medication  No medication insecurities    Medical Provider  Yes    Referrals  Not Applicable    ED Visit Averted  Not Applicable    Life-Saving Intervention Made  Not Applicable     07/62/26 check B/p and Pulse. Ate Cup cake icing.  Wt. 180#. BP 147/80 Pulse 101.  States she is hyper.  Volunteers at [employer] to keep busy.  Theron Arista, RN, [phone]

## 2017-05-27 ENCOUNTER — Other Ambulatory Visit: Payer: Self-pay | Admitting: Family Medicine

## 2017-05-27 DIAGNOSIS — G43919 Migraine, unspecified, intractable, without status migrainosus: Secondary | ICD-10-CM | POA: Diagnosis not present

## 2017-05-27 DIAGNOSIS — R110 Nausea: Secondary | ICD-10-CM | POA: Diagnosis not present

## 2017-05-27 DIAGNOSIS — R5383 Other fatigue: Secondary | ICD-10-CM | POA: Diagnosis not present

## 2017-05-27 DIAGNOSIS — R413 Other amnesia: Secondary | ICD-10-CM | POA: Diagnosis not present

## 2017-05-29 ENCOUNTER — Ambulatory Visit (HOSPITAL_COMMUNITY)
Admission: RE | Admit: 2017-05-29 | Discharge: 2017-05-29 | Disposition: A | Discharge status: Home / Self Care | Payer: BLUE CROSS/BLUE SHIELD | Source: Ambulatory Visit | Attending: Family Medicine | Admitting: Family Medicine

## 2017-05-29 DIAGNOSIS — G43919 Migraine, unspecified, intractable, without status migrainosus: Secondary | ICD-10-CM

## 2017-05-29 DIAGNOSIS — R51 Headache: Secondary | ICD-10-CM | POA: Diagnosis not present

## 2017-06-10 DIAGNOSIS — G4733 Obstructive sleep apnea (adult) (pediatric): Secondary | ICD-10-CM | POA: Diagnosis not present

## 2017-06-10 DIAGNOSIS — J449 Chronic obstructive pulmonary disease, unspecified: Secondary | ICD-10-CM | POA: Diagnosis not present

## 2017-06-18 DIAGNOSIS — F3162 Bipolar disorder, current episode mixed, moderate: Secondary | ICD-10-CM | POA: Diagnosis not present

## 2017-06-18 DIAGNOSIS — F410 Panic disorder [episodic paroxysmal anxiety] without agoraphobia: Secondary | ICD-10-CM | POA: Diagnosis not present

## 2017-06-18 DIAGNOSIS — F4312 Post-traumatic stress disorder, chronic: Secondary | ICD-10-CM | POA: Diagnosis not present

## 2017-07-11 DIAGNOSIS — J449 Chronic obstructive pulmonary disease, unspecified: Secondary | ICD-10-CM | POA: Diagnosis not present

## 2017-07-11 DIAGNOSIS — G4733 Obstructive sleep apnea (adult) (pediatric): Secondary | ICD-10-CM | POA: Diagnosis not present

## 2017-07-17 DIAGNOSIS — M5030 Other cervical disc degeneration, unspecified cervical region: Secondary | ICD-10-CM | POA: Insufficient documentation

## 2017-07-17 DIAGNOSIS — M5136 Other intervertebral disc degeneration, lumbar region: Secondary | ICD-10-CM | POA: Insufficient documentation

## 2017-07-17 DIAGNOSIS — M51369 Other intervertebral disc degeneration, lumbar region without mention of lumbar back pain or lower extremity pain: Secondary | ICD-10-CM | POA: Insufficient documentation

## 2017-07-26 DIAGNOSIS — M5136 Other intervertebral disc degeneration, lumbar region: Secondary | ICD-10-CM | POA: Diagnosis not present

## 2017-07-26 DIAGNOSIS — M5030 Other cervical disc degeneration, unspecified cervical region: Secondary | ICD-10-CM | POA: Diagnosis not present

## 2017-08-07 DIAGNOSIS — F419 Anxiety disorder, unspecified: Secondary | ICD-10-CM | POA: Diagnosis not present

## 2017-08-07 DIAGNOSIS — R12 Heartburn: Secondary | ICD-10-CM | POA: Diagnosis not present

## 2017-08-07 DIAGNOSIS — R1030 Lower abdominal pain, unspecified: Secondary | ICD-10-CM | POA: Diagnosis not present

## 2017-08-07 DIAGNOSIS — M79672 Pain in left foot: Secondary | ICD-10-CM | POA: Diagnosis not present

## 2017-08-07 DIAGNOSIS — M940 Chondrocostal junction syndrome [Tietze]: Secondary | ICD-10-CM | POA: Diagnosis not present

## 2017-08-07 DIAGNOSIS — K582 Mixed irritable bowel syndrome: Secondary | ICD-10-CM | POA: Diagnosis not present

## 2017-08-07 DIAGNOSIS — R079 Chest pain, unspecified: Secondary | ICD-10-CM | POA: Diagnosis not present

## 2017-08-08 DIAGNOSIS — J449 Chronic obstructive pulmonary disease, unspecified: Secondary | ICD-10-CM | POA: Diagnosis not present

## 2017-08-20 DIAGNOSIS — F419 Anxiety disorder, unspecified: Secondary | ICD-10-CM | POA: Diagnosis not present

## 2017-08-20 DIAGNOSIS — S92902A Unspecified fracture of left foot, initial encounter for closed fracture: Secondary | ICD-10-CM | POA: Diagnosis not present

## 2017-08-20 DIAGNOSIS — R030 Elevated blood-pressure reading, without diagnosis of hypertension: Secondary | ICD-10-CM | POA: Diagnosis not present

## 2017-08-20 DIAGNOSIS — R3915 Urgency of urination: Secondary | ICD-10-CM | POA: Diagnosis not present

## 2017-08-20 DIAGNOSIS — S92502D Displaced unspecified fracture of left lesser toe(s), subsequent encounter for fracture with routine healing: Secondary | ICD-10-CM | POA: Diagnosis not present

## 2017-08-20 NOTE — Congregational Nurse Program (Unsigned)
Congregational Nurse Program Note  Date of Encounter: 08/20/2017  Past Medical History: Past Medical History:  Diagnosis Date  . Arthritis    hands and knees  . Bipolar 1 disorder (HCC) Degenerative disk disease  . Borderline diabetic   . Chronic back pain   . Chronic neck pain   . Complication of anesthesia    high anxiety-does not want to be alone  . COPD (chronic obstructive pulmonary disease) ([place])   . Current use of estrogen therapy 01/12/2014  . Depression   . Headache   . Hot flashes 12/15/2013  . Hypertension   . IBS (irritable bowel syndrome)   . Incontinence   . Kidney stone   . Multiple personality disorder   . Panic attacks   . Peptic ulcer   . Peripheral neuropathy   . Rosacea   . Shingles   . Sleep apnea    uses a cpap-with oxygen    Encounter Details: CNP Questionnaire - 08/15/17 1117      Questionnaire   Patient Status  Not Applicable    Race  White or Caucasian    Location Patient Served At  -- [place]  Yes, have food insecurities    Housing/Utilities  Yes, have permanent housing    Transportation  No transportation needs    Interpersonal Safety  Yes, feel physically and emotionally safe where you currently live    Medication  No medication insecurities    Medical Provider  Yes [facility]   [facility]   Referrals  Not Applicable    ED Visit Averted  Not Applicable    Life-Saving Intervention Made  Not Applicable      08/09/33 Wanted to talk.  Found out that she has a broken bone in her (L) foot.Has neuropathy in feet years ago.  Had surgery on (R) foot.  Still has problems with this foot.Has kidney problems, taking medication for this.To make a follow up appointment with her doctor.B/P 136/85 Pulse 83.  Theron Arista, RN [phone].

## 2017-08-26 ENCOUNTER — Ambulatory Visit: Payer: Self-pay | Admitting: Orthopedic Surgery

## 2017-08-26 DIAGNOSIS — G4733 Obstructive sleep apnea (adult) (pediatric): Secondary | ICD-10-CM | POA: Diagnosis not present

## 2017-08-26 DIAGNOSIS — R05 Cough: Secondary | ICD-10-CM | POA: Diagnosis not present

## 2017-08-26 DIAGNOSIS — R0989 Other specified symptoms and signs involving the circulatory and respiratory systems: Secondary | ICD-10-CM | POA: Diagnosis not present

## 2017-08-26 DIAGNOSIS — R062 Wheezing: Secondary | ICD-10-CM | POA: Diagnosis not present

## 2017-08-26 DIAGNOSIS — J209 Acute bronchitis, unspecified: Secondary | ICD-10-CM | POA: Diagnosis not present

## 2017-08-28 DIAGNOSIS — G4733 Obstructive sleep apnea (adult) (pediatric): Secondary | ICD-10-CM | POA: Diagnosis not present

## 2017-08-28 DIAGNOSIS — N3941 Urge incontinence: Secondary | ICD-10-CM | POA: Diagnosis not present

## 2017-09-02 DIAGNOSIS — S90129D Contusion of unspecified lesser toe(s) without damage to nail, subsequent encounter: Secondary | ICD-10-CM | POA: Diagnosis not present

## 2017-09-02 DIAGNOSIS — J441 Chronic obstructive pulmonary disease with (acute) exacerbation: Secondary | ICD-10-CM | POA: Diagnosis not present

## 2017-09-02 DIAGNOSIS — S61214D Laceration without foreign body of right ring finger without damage to nail, subsequent encounter: Secondary | ICD-10-CM | POA: Diagnosis not present

## 2017-09-04 ENCOUNTER — Ambulatory Visit: Payer: Self-pay | Admitting: Orthopedic Surgery

## 2017-09-08 DIAGNOSIS — J449 Chronic obstructive pulmonary disease, unspecified: Secondary | ICD-10-CM | POA: Diagnosis not present

## 2017-09-09 ENCOUNTER — Ambulatory Visit: Payer: BLUE CROSS/BLUE SHIELD | Admitting: Orthopedic Surgery

## 2017-09-10 DIAGNOSIS — F3162 Bipolar disorder, current episode mixed, moderate: Secondary | ICD-10-CM | POA: Diagnosis not present

## 2017-09-10 DIAGNOSIS — F410 Panic disorder [episodic paroxysmal anxiety] without agoraphobia: Secondary | ICD-10-CM | POA: Diagnosis not present

## 2017-09-10 DIAGNOSIS — F4312 Post-traumatic stress disorder, chronic: Secondary | ICD-10-CM | POA: Diagnosis not present

## 2017-09-22 ENCOUNTER — Other Ambulatory Visit: Payer: Self-pay | Admitting: Orthopedic Surgery

## 2017-09-22 ENCOUNTER — Encounter: Payer: Self-pay | Admitting: Orthopedic Surgery

## 2017-09-22 ENCOUNTER — Ambulatory Visit: Payer: BLUE CROSS/BLUE SHIELD | Admitting: Orthopedic Surgery

## 2017-09-22 ENCOUNTER — Ambulatory Visit (INDEPENDENT_AMBULATORY_CARE_PROVIDER_SITE_OTHER): Payer: BLUE CROSS/BLUE SHIELD

## 2017-09-22 VITALS — BP 150/100 | HR 78 | Ht 69.0 in | Wt 180.0 lb

## 2017-09-22 DIAGNOSIS — M79672 Pain in left foot: Secondary | ICD-10-CM

## 2017-09-22 DIAGNOSIS — S90851A Superficial foreign body, right foot, initial encounter: Secondary | ICD-10-CM

## 2017-09-22 NOTE — Patient Instructions (Signed)
Call office back if the foreign body works its way to the surface

## 2017-09-22 NOTE — Progress Notes (Signed)
NEW PATIENT OFFICE VISIT   Chief Complaint  Patient presents with  . Foot Pain    left     66 a 62 year old female presented initially for proximal phalanx fracture left foot which did not heal but by the time she is seeing [place] today she is actually complaining of a foreign body in her right foot sustained 3 weeks ago when a glass that had broken previously she stepped on and mild discomfort on the plantar aspect of the foot some of it went into her foot.  She pulled some of it out but thinks there is still something in there because she still having.  There is no associated erythema but there is some mild pain with weightbearing   Review of Systems  Constitutional: Negative for fever.  Skin: Negative.   Psychiatric/Behavioral:       The patient is anxious today she says she has something else to do that is bothering her     Past Medical History:  Diagnosis Date  . Arthritis    hands and knees  . Bipolar 1 disorder (HCC) Degenerative disk disease  . Borderline diabetic   . Chronic back pain   . Chronic neck pain   . Complication of anesthesia    high anxiety-does not want to be alone  . COPD (chronic obstructive pulmonary disease) ([place])   . Current use of estrogen therapy 01/12/2014  . Depression   . Headache   . Hot flashes 12/15/2013  . Hypertension   . IBS (irritable bowel syndrome)   . Incontinence   . Kidney stone   . 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[place])   . Panic attacks   . Peptic ulcer   . Peripheral neuropathy   . Rosacea   . Shingles   . Sleep apnea    uses a cpap-with oxygen    Past Surgical History:  Procedure Laterality Date  . ABDOMINAL HYSTERECTOMY    . BUNIONECTOMY WITH HAMMERTOE RECONSTRUCTION Right 12/10/2012   Procedure: RIGHT FIRST METATARSAL CHEVRON BUNION CORRECTION,  2 AND 3 HAMMERTOE CORRECTION , RIGHT 3 AND 4 TOE NAIL EXCISION ;  Surgeon: Wylene Simmer, MD;  Location: [facility];  Service: Orthopedics;  Laterality: Right;  . FOOT  ARTHRODESIS  2000   both feet  . RECTAL SURGERY    . TONSILLECTOMY      Family History  Problem Relation Age of Onset  . Diabetes Mother   . Other Mother        vertigo; chronic eye disease  . COPD Father   . COPD Sister   . Alcohol abuse Brother   . Diabetes Maternal Grandmother   . Diabetes Maternal Grandfather   . Diabetes Sister   . Other Sister        hearing problems  . Hyperlipidemia Sister   . Alcohol abuse Brother   . COPD Brother   . Alcohol abuse Brother   . Alcohol abuse Brother   . Other Brother        aneursym   Social History   Tobacco Use  . Smoking status: Former Smoker    Packs/day: 0.50    Years: 35.00    Pack years: 17.50    Types: Cigarettes    Last attempt to quit: 10/19/2012    Years since quitting: 4.9  . Smokeless tobacco: Never Used  Substance Use Topics  . Alcohol use: No    Alcohol/week: 0.0 oz  . Drug use: No    @ALL @  Current Meds  Medication Sig  . acetaminophen (TYLENOL) 500 MG tablet Take 1,000 mg by mouth every 6 (six) hours as needed for headache.  . albuterol (PROVENTIL HFA;VENTOLIN HFA) 108 (90 Base) MCG/ACT inhaler Inhale 2 puffs into the lungs every 4 (four) hours as needed for wheezing or shortness of breath.  . diazepam (VALIUM) 5 MG tablet Take 1 tablet (5 mg total) by mouth every 8 (eight) hours as needed (vertigo). (Patient taking differently: Take 5 mg by mouth 3 (three) times daily. )  . esomeprazole (NEXIUM) 40 MG capsule Take 40 mg by mouth 2 (two) times daily before a meal.   . estrogens, conjugated, (PREMARIN) 0.3 MG tablet Take 0.3 mg by mouth daily. Take daily for 21 days then do not take for 7 days.  . hydrOXYzine (VISTARIL) 25 MG capsule Take 25 mg by mouth 3 (three) times daily as needed for anxiety.  [name] ketoconazole (NIZORAL) 2 % shampoo Apply 1 application topically 2 (two) times a week.  [name] LYRICA 150 MG capsule Take 150 mg by mouth 2 (two) times daily.   . Oxcarbazepine (TRILEPTAL) 300 MG tablet Take 1  tablet (300 mg total) by mouth 2 (two) times daily at 8 am and 10 pm.  . oxyCODONE (ROXICODONE) 15 MG immediate release tablet Take 7.5-15 mg by mouth 4 (four) times daily.   . pravastatin (PRAVACHOL) 40 MG tablet Take 40 mg by mouth daily. Reported on 06/06/2015  . simethicone (MYLICON) 80 MG chewable tablet Chew 80 mg by mouth every 6 (six) hours as needed for flatulence.  . tiotropium (SPIRIVA) 18 MCG inhalation capsule Place 18 mcg into inhaler and inhale daily.  . trazodone (DESYREL) 300 MG tablet Take 600 mg by mouth at bedtime as needed for sleep.  [name] Umeclidinium-Vilanterol (ANORO ELLIPTA) 62.5-25 MCG/INH AEPB Inhale 1 puff into the lungs daily.  . vitamin B-12 1000 MCG tablet Take 1 tablet (1,000 mcg total) by mouth daily with breakfast.    BP (!) 150/100   Pulse 78   Ht 5\' 9"  (1.753 m)   Wt 180 lb (81.6 kg)   BMI 26.58 kg/m   Physical Exam  Constitutional: She is oriented to person, place, and time. She appears well-developed and well-nourished.  Neurological: She is alert and oriented to person, place, and time.  Psychiatric: She has a normal mood and affect. Judgment normal.  Vitals reviewed.   Ortho Exam   The gait is normal  She is noted to have a cockup second toe deformity from prior surgery with an incision over the second toe of the right foot.  There is tenderness mid plantar aspect right foot no evidence of puncture wound thickening of the plantar fascia is noted with no evidence of foreign body.  Crossover toe third to second is noted as well.  The remaining portion of the foot alignment is normal.  There is no atrophy in the foot.  She says she has neuropathy but she can feel me touching the bottom of her foot she has a good pulse  The left foot which was the initial consultation request also has a cockup second toe deformity normal alignment of the questioned proximal phalanx fracture which has obviously healed without any difficulty range of motion of the  metatarsophalangeal joint is normal there is no atrophy in that foot  [place] ordered?  Yes x-ray right foot to rule out foreign body  My independent reading of xrays: See dictated report, x-ray shows no  evidence of foreign body there are 2 screws in the foot from prior surgery   Encounter Diagnoses  Name Primary?  . Left foot pain Yes  . Foreign body in right foot, initial encounter      PLAN:    Certainly we would recommend removing the foreign body if there was one seen or there was some sign of erythema or infection there but I told her sometimes this will work its way back to the surface and if it gets red or swollen she should come back but otherwise recommend exploration no evidence to recommend exploration

## 2017-10-01 DIAGNOSIS — Z23 Encounter for immunization: Secondary | ICD-10-CM | POA: Diagnosis not present

## 2017-10-07 DIAGNOSIS — R109 Unspecified abdominal pain: Secondary | ICD-10-CM | POA: Diagnosis not present

## 2017-10-07 DIAGNOSIS — G8929 Other chronic pain: Secondary | ICD-10-CM | POA: Diagnosis not present

## 2017-10-08 DIAGNOSIS — N898 Other specified noninflammatory disorders of vagina: Secondary | ICD-10-CM | POA: Diagnosis not present

## 2017-10-08 DIAGNOSIS — J449 Chronic obstructive pulmonary disease, unspecified: Secondary | ICD-10-CM | POA: Diagnosis not present

## 2017-11-08 DIAGNOSIS — J449 Chronic obstructive pulmonary disease, unspecified: Secondary | ICD-10-CM | POA: Diagnosis not present

## 2017-11-17 ENCOUNTER — Other Ambulatory Visit: Payer: Self-pay

## 2017-11-17 ENCOUNTER — Encounter (HOSPITAL_COMMUNITY): Payer: Self-pay | Admitting: Emergency Medicine

## 2017-11-17 ENCOUNTER — Emergency Department (HOSPITAL_COMMUNITY)
Admission: EM | Admit: 2017-11-17 | Discharge: 2017-11-17 | Disposition: A | Discharge status: Home / Self Care | Payer: BLUE CROSS/BLUE SHIELD | Attending: Emergency Medicine | Admitting: Emergency Medicine

## 2017-11-17 ENCOUNTER — Emergency Department (HOSPITAL_COMMUNITY): Payer: BLUE CROSS/BLUE SHIELD

## 2017-11-17 DIAGNOSIS — I10 Essential (primary) hypertension: Secondary | ICD-10-CM | POA: Diagnosis not present

## 2017-11-17 DIAGNOSIS — R0789 Other chest pain: Secondary | ICD-10-CM | POA: Insufficient documentation

## 2017-11-17 DIAGNOSIS — Z79899 Other long term (current) drug therapy: Secondary | ICD-10-CM | POA: Insufficient documentation

## 2017-11-17 DIAGNOSIS — R079 Chest pain, unspecified: Secondary | ICD-10-CM | POA: Diagnosis not present

## 2017-11-17 DIAGNOSIS — Z87891 Personal history of nicotine dependence: Secondary | ICD-10-CM | POA: Diagnosis not present

## 2017-11-17 DIAGNOSIS — J449 Chronic obstructive pulmonary disease, unspecified: Secondary | ICD-10-CM | POA: Insufficient documentation

## 2017-11-17 DIAGNOSIS — R110 Nausea: Secondary | ICD-10-CM | POA: Diagnosis not present

## 2017-11-17 DIAGNOSIS — R450 Nervousness: Secondary | ICD-10-CM | POA: Diagnosis not present

## 2017-11-17 LAB — BASIC METABOLIC PANEL
ANION GAP: 6 (ref 5–15)
BUN: 13 mg/dL (ref 6–20)
CALCIUM: 9.1 mg/dL (ref 8.9–10.3)
CO2: 33 mmol/L — ABNORMAL HIGH (ref 22–32)
Chloride: 104 mmol/L (ref 101–111)
Creatinine, Ser: 0.75 mg/dL (ref 0.44–1.00)
Glucose, Bld: 120 mg/dL — ABNORMAL HIGH (ref 65–99)
Potassium: 4.2 mmol/L (ref 3.5–5.1)
SODIUM: 143 mmol/L (ref 135–145)

## 2017-11-17 LAB — TROPONIN I

## 2017-11-17 LAB — CBC
HCT: 46.7 % — ABNORMAL HIGH (ref 36.0–46.0)
HEMOGLOBIN: 15.5 g/dL — AB (ref 12.0–15.0)
MCH: 31.8 pg (ref 26.0–34.0)
MCHC: 33.2 g/dL (ref 30.0–36.0)
MCV: 95.7 fL (ref 78.0–100.0)
Platelets: 221 10*3/uL (ref 150–400)
RBC: 4.88 MIL/uL (ref 3.87–5.11)
RDW: 12.6 % (ref 11.5–15.5)
WBC: 7.7 10*3/uL (ref 4.0–10.5)

## 2017-11-17 MED ORDER — ONDANSETRON HCL 4 MG/2ML IJ SOLN
4.0000 mg | Freq: Once | INTRAMUSCULAR | Status: AC
  Administered 2017-11-17: 4 mg via INTRAVENOUS
  Filled 2017-11-17: qty 2

## 2017-11-17 MED ORDER — PREDNISONE 20 MG PO TABS: 40.0000 mg | ORAL_TABLET | Freq: Every day | ORAL | 0 refills | Status: DC

## 2017-11-17 MED ORDER — KETOROLAC TROMETHAMINE 30 MG/ML IJ SOLN
15.0000 mg | Freq: Once | INTRAMUSCULAR | Status: AC
Start: 2017-11-17 — End: 2017-11-17
  Administered 2017-11-17: 15 mg via INTRAVENOUS
  Filled 2017-11-17: qty 1

## 2017-11-17 MED ORDER — METHYLPREDNISOLONE SODIUM SUCC 125 MG IJ SOLR
125.0000 mg | Freq: Once | INTRAMUSCULAR | Status: AC
  Administered 2017-11-17: 125 mg via INTRAVENOUS
  Filled 2017-11-17: qty 2

## 2017-11-17 MED ORDER — SODIUM CHLORIDE 0.9 % IV BOLUS
1000.0000 mL | Freq: Once | INTRAVENOUS | Status: AC
  Administered 2017-11-17: 1000 mL via INTRAVENOUS

## 2017-11-17 MED ORDER — ALBUTEROL SULFATE (2.5 MG/3ML) 0.083% IN NEBU
5.0000 mg | INHALATION_SOLUTION | Freq: Once | RESPIRATORY_TRACT | Status: AC
  Administered 2017-11-17: 5 mg via RESPIRATORY_TRACT
  Filled 2017-11-17: qty 6

## 2017-11-17 NOTE — Discharge Instructions (Signed)
As discussed, for the next 2 days you need to use your albuterol inhaler every 4 hours. Additionally, please use the prescribed steroids for the next 4 days. Be sure to follow-up with your physician or return here for concerning changes in your condition.

## 2017-11-17 NOTE — ED Triage Notes (Signed)
Patient complaining of chest pain radiating into bilateral shoulders and back since yesterday.

## 2017-11-17 NOTE — ED Provider Notes (Signed)
[facility], [place] EMERGENCY DEPARTMENT Provider Note   CSN: [id number] Arrival date & time: 11/17/17  1131     History   Chief Complaint Chief Complaint  Patient presents with  . Chest Pain    HPI Cynthia Deleon is a 62 y.o. female.  HPI Patient presents with concern of chest pain, nausea. Chest pain is pressure-like, has been present for several days, though initially the patient had relatively sensitive nausea, weakness, generalized discomfort. This occurred 4 days ago, and since that time she has had persistent nausea, with after mentioned worsening chest pain. Initially there was also pressure across the posterior shoulder area, but this seems to have improved. No syncope, no vomiting, diarrhea, no persistent abdominal pain. No relief with baking soda, nor Maalox. Patient is a smoker, continues to smoke cigarettes.   Smoking cessation provided, particularly in light of this patient's evaluation in the ED.  Past Medical History:  Diagnosis Date  . Arthritis    hands and knees  . Bipolar 1 disorder (HCC) Degenerative disk disease  . Borderline diabetic   . Chronic back pain   . Chronic neck pain   . Complication of anesthesia    high anxiety-does not want to be alone  . COPD (chronic obstructive pulmonary disease) ([place])   . Current use of estrogen therapy 01/12/2014  . Depression   . Headache   . Hot flashes 12/15/2013  . Hypertension   . IBS (irritable bowel syndrome)   . Incontinence   . Kidney stone   . 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 Panic attacks   . Peptic ulcer   . Peripheral neuropathy   . Rosacea   . Shingles   . Sleep apnea    uses a cpap-with oxygen    Patient Active Problem List   Diagnosis Date Noted  . Other abnormal glucose 04/17/2016  . OCD (obsessive compulsive disorder) 11/15/2015  . Tobacco use disorder 11/15/2015  . Peripheral neuropathy 10/10/2015  . Current use of estrogen therapy 01/12/2014  . PTSD (post-traumatic stress  disorder) 08/03/2011  . Bipolar I disorder, most recent episode mixed, severe with psychotic features [facility])     Past Surgical History:  Procedure Laterality Date  . ABDOMINAL HYSTERECTOMY    . BUNIONECTOMY WITH HAMMERTOE RECONSTRUCTION Right 12/10/2012   Procedure: RIGHT FIRST METATARSAL CHEVRON BUNION CORRECTION,  2 AND 3 HAMMERTOE CORRECTION , RIGHT 3 AND 4 TOE NAIL EXCISION ;  Surgeon: Wylene Simmer, MD;  Location: [place];  Service: Orthopedics;  Laterality: Right;  . FOOT ARTHRODESIS  2000   both feet  . RECTAL SURGERY    . TONSILLECTOMY       OB History    Gravida  5   Para  5   Term      Preterm      AB      Living  5     SAB      TAB      Ectopic      Multiple      Live Births               Home Medications    Prior to Admission medications   Medication Sig Start Date End Date Taking? Authorizing Provider  albuterol (PROVENTIL HFA;VENTOLIN HFA) 108 (90 Base) MCG/ACT inhaler Inhale 2 puffs into the lungs every 4 (four) hours as needed for wheezing or shortness of breath. 06/06/15  Yes Francine Graven, DO  diazepam (VALIUM) 5 MG tablet Take  1 tablet (5 mg total) by mouth every 8 (eight) hours as needed (vertigo). Patient taking differently: Take 5 mg by mouth 3 (three) times daily.  06/14/14  Yes Tanna Furry, MD  esomeprazole (NEXIUM) 40 MG capsule Take 40 mg by mouth 2 (two) times daily before a meal.    Yes [provider]  estrogens, conjugated, (PREMARIN) 0.3 MG tablet Take 0.3 mg by mouth daily. Take daily for 21 days then do not take for 7 days.   Yes [provider]  fluticasone (FLONASE) 50 MCG/ACT nasal spray Place 1 spray into both nostrils daily as needed. 10/29/17  Yes [provider]  hydrOXYzine (VISTARIL) 25 MG capsule Take 25 mg by mouth 3 (three) times daily as needed for anxiety.   Yes [provider]  ibuprofen (ADVIL,MOTRIN) 200 MG tablet Take 400 mg by mouth every 6 (six) hours as  needed.   Yes [provider]  ketoconazole (NIZORAL) 2 % shampoo Apply 1 application topically 2 (two) times a week.   Yes [provider]  levocetirizine (XYZAL) 5 MG tablet Take 1 tablet by mouth daily. 10/29/17  Yes [provider]  LYRICA 150 MG capsule Take 150 mg by mouth 2 (two) times daily.  11/06/16  Yes [provider]  Oxcarbazepine (TRILEPTAL) 300 MG tablet Take 1 tablet (300 mg total) by mouth 2 (two) times daily at 8 am and 10 pm. 11/22/15  Yes Pucilowska, Jolanta B, MD  oxyCODONE (ROXICODONE) 15 MG immediate release tablet Take 7.5-15 mg by mouth 4 (four) times daily.    Yes [provider]  pravastatin (PRAVACHOL) 40 MG tablet Take 40 mg by mouth daily. Reported on 06/06/2015   Yes [provider]  simethicone (MYLICON) 80 MG chewable tablet Chew 80 mg by mouth every 6 (six) hours as needed for flatulence.   Yes [provider]  Umeclidinium-Vilanterol (ANORO ELLIPTA) 62.5-25 MCG/INH AEPB Inhale 1 puff into the lungs daily.   Yes [provider]  vitamin B-12 1000 MCG tablet Take 1 tablet (1,000 mcg total) by mouth daily with breakfast. 11/22/15  Yes Pucilowska, Jolanta B, MD    Family History Family History  Problem Relation Age of Onset  . Diabetes Mother   . Other Mother        vertigo; chronic eye disease  . COPD Father   . COPD Sister   . Alcohol abuse Brother   . Diabetes Maternal Grandmother   . Diabetes Maternal Grandfather   . Diabetes Sister   . Other Sister        hearing problems  . Hyperlipidemia Sister   . Alcohol abuse Brother   . COPD Brother   . Alcohol abuse Brother   . Alcohol abuse Brother   . Other Brother        aneursym    Social History Social History   Tobacco Use  . Smoking status: Former Smoker    Packs/day: 0.50    Years: 35.00    Pack years: 17.50    Types: Cigarettes    Last attempt to quit: 10/19/2012    Years since quitting: 5.0  . Smokeless tobacco: Never  Used  Substance Use Topics  . Alcohol use: No    Alcohol/week: 0.0 oz  . Drug use: No     Allergies   Chantix [varenicline]; Ambien [zolpidem]; Codeine; Divalproex sodium; Hydrocodone; Paxil [paroxetine hcl]; Sulfur; and Amoxicillin   Review of Systems Review of Systems  Constitutional:  Per HPI, otherwise negative  HENT:       Per HPI, otherwise negative  Respiratory:       Per HPI, otherwise negative  Cardiovascular:       Per HPI, otherwise negative  Gastrointestinal: Positive for nausea. Negative for abdominal pain and vomiting.  Endocrine:       Negative aside from HPI  Genitourinary:       Neg aside from HPI   Musculoskeletal:       Per HPI, otherwise negative  Skin: Negative.   Neurological: Negative for syncope.  Psychiatric/Behavioral: Positive for dysphoric mood. The patient is nervous/anxious.      Physical Exam Updated Vital Signs BP 128/74   Pulse (!) 58   Temp 97.7 F (36.5 C) (Oral)   Resp 10   Ht 5\' 9"  (1.753 m)   Wt 77.1 kg (170 lb)   SpO2 92%   BMI 25.10 kg/m   Physical Exam  Constitutional: She is oriented to person, place, and time. She appears well-developed and well-nourished.  Anxious F awake and alert  HENT:  Head: Normocephalic and atraumatic.  Eyes: Conjunctivae and EOM are normal.  Cardiovascular: Normal rate and regular rhythm.  Pulmonary/Chest: She has decreased breath sounds.  Abdominal: She exhibits no distension.  Musculoskeletal: She exhibits no edema.  Neurological: She is alert and oriented to person, place, and time. No cranial nerve deficit.  Skin: Skin is warm and dry.  Psychiatric: Her mood appears anxious.  Nursing note and vitals reviewed.    ED Treatments / Results  Labs (all labs ordered are listed, but only abnormal results are displayed) Labs Reviewed  BASIC METABOLIC PANEL - Abnormal; Notable for the following components:      Result Value   CO2 33 (*)    Glucose, Bld 120 (*)    All other  components within normal limits  CBC - Abnormal; Notable for the following components:   Hemoglobin 15.5 (*)    HCT 46.7 (*)    All other components within normal limits  TROPONIN I    EKG EKG Interpretation  Date/Time:  Monday November 17 2017 11:42:44 EDT Ventricular Rate:  69 PR Interval:    QRS Duration: 88 QT Interval:  399 QTC Calculation: 428 R Axis:   50 Text Interpretation:  Sinus rhythm Abnormal R-wave progression, early transition Baseline wander in lead(s) V3 Abnormal ekg Confirmed by Carmin Muskrat [phone]) on 11/17/2017 12:03:07 PM   Radiology Dg Chest 2 View  Result Date: 11/17/2017 CLINICAL DATA:  Chest pain.  History of smoking. EXAM: CHEST - 2 VIEW COMPARISON:  01/17/2017 FINDINGS: The cardiomediastinal silhouette is unchanged and within normal limits. There is mild chronic prominence of the interstitial markings. No airspace consolidation, edema, pleural effusion, or pneumothorax is identified. No acute osseous abnormality is seen. IMPRESSION: No active cardiopulmonary disease. Electronically Signed   By: Logan Bores M.D.   On: 11/17/2017 12:49    Procedures Procedures (including critical care time)  Medications Ordered in ED Medications  sodium chloride 0.9 % bolus 1,000 mL (1,000 mLs Intravenous New Bag/Given 11/17/17 1245)  ketorolac (TORADOL) 30 MG/ML injection 15 mg (15 mg Intravenous Given 11/17/17 1245)  methylPREDNISolone sodium succinate (SOLU-MEDROL) 125 mg/2 mL injection 125 mg (125 mg Intravenous Given 11/17/17 1244)  albuterol (PROVENTIL) (2.5 MG/3ML) 0.083% nebulizer solution 5 mg (5 mg Nebulization Given 11/17/17 1309)  ondansetron (ZOFRAN) injection 4 mg (4 mg Intravenous Given 11/17/17 1245)     Initial Impression / Assessment and Plan /  ED Course  I have reviewed the triage vital signs and the nursing notes.  Pertinent labs & imaging results that were available during my care of the patient were reviewed by me and considered in my medical  decision making (see chart for details).     2:08 PM Patient awake and alert, sitting upright, appears substantially better She notes that her lungs opened up after breathing treatment. I discussed findings, including no evidence for ongoing coronary ischemia, no pneumonia with her and her female companion. We again discussed the importance of smoking cessation, given consideration of her pulmonary condition contributing to the chest tightness episode bringing her here today. With no evidence for ongoing coronary ischemia, no evidence for stroke, no evidence for pneumonia, no evidence of bacteremia, sepsis, and her improvement she was discharged with steroids, scheduled bronchodilator sessions.  Final Clinical Impressions(s) / ED Diagnoses  Atypical chest pain   Carmin Muskrat, MD 11/17/17 1409

## 2017-11-17 NOTE — ED Notes (Signed)
Respiratory paged at this time for tx.  

## 2017-11-20 ENCOUNTER — Other Ambulatory Visit: Payer: Self-pay | Admitting: Family Medicine

## 2017-11-20 DIAGNOSIS — J449 Chronic obstructive pulmonary disease, unspecified: Secondary | ICD-10-CM | POA: Diagnosis not present

## 2017-11-20 DIAGNOSIS — F319 Bipolar disorder, unspecified: Secondary | ICD-10-CM | POA: Diagnosis not present

## 2017-11-20 DIAGNOSIS — Z23 Encounter for immunization: Secondary | ICD-10-CM | POA: Diagnosis not present

## 2017-11-20 DIAGNOSIS — K219 Gastro-esophageal reflux disease without esophagitis: Secondary | ICD-10-CM | POA: Diagnosis not present

## 2017-11-20 DIAGNOSIS — N281 Cyst of kidney, acquired: Secondary | ICD-10-CM

## 2017-11-20 DIAGNOSIS — M549 Dorsalgia, unspecified: Secondary | ICD-10-CM | POA: Diagnosis not present

## 2017-11-24 ENCOUNTER — Ambulatory Visit (HOSPITAL_COMMUNITY)
Admission: RE | Admit: 2017-11-24 | Discharge: 2017-11-24 | Disposition: A | Discharge status: Home / Self Care | Payer: BLUE CROSS/BLUE SHIELD | Source: Ambulatory Visit | Attending: Family Medicine | Admitting: Family Medicine

## 2017-11-24 DIAGNOSIS — Z87448 Personal history of other diseases of urinary system: Secondary | ICD-10-CM | POA: Diagnosis not present

## 2017-11-24 DIAGNOSIS — Z09 Encounter for follow-up examination after completed treatment for conditions other than malignant neoplasm: Secondary | ICD-10-CM | POA: Insufficient documentation

## 2017-11-24 DIAGNOSIS — R109 Unspecified abdominal pain: Secondary | ICD-10-CM | POA: Diagnosis not present

## 2017-11-24 DIAGNOSIS — N281 Cyst of kidney, acquired: Secondary | ICD-10-CM

## 2017-12-01 DIAGNOSIS — M5030 Other cervical disc degeneration, unspecified cervical region: Secondary | ICD-10-CM | POA: Diagnosis not present

## 2017-12-01 DIAGNOSIS — M5136 Other intervertebral disc degeneration, lumbar region: Secondary | ICD-10-CM | POA: Diagnosis not present

## 2017-12-03 DIAGNOSIS — H1031 Unspecified acute conjunctivitis, right eye: Secondary | ICD-10-CM | POA: Diagnosis not present

## 2017-12-08 DIAGNOSIS — G4733 Obstructive sleep apnea (adult) (pediatric): Secondary | ICD-10-CM | POA: Diagnosis not present

## 2017-12-08 DIAGNOSIS — J449 Chronic obstructive pulmonary disease, unspecified: Secondary | ICD-10-CM | POA: Diagnosis not present

## 2017-12-09 DIAGNOSIS — M5136 Other intervertebral disc degeneration, lumbar region: Secondary | ICD-10-CM | POA: Diagnosis not present

## 2017-12-09 DIAGNOSIS — M5030 Other cervical disc degeneration, unspecified cervical region: Secondary | ICD-10-CM | POA: Diagnosis not present

## 2017-12-10 DIAGNOSIS — G4733 Obstructive sleep apnea (adult) (pediatric): Secondary | ICD-10-CM | POA: Diagnosis not present

## 2017-12-10 DIAGNOSIS — J449 Chronic obstructive pulmonary disease, unspecified: Secondary | ICD-10-CM | POA: Diagnosis not present

## 2017-12-22 DIAGNOSIS — G4733 Obstructive sleep apnea (adult) (pediatric): Secondary | ICD-10-CM | POA: Diagnosis not present

## 2017-12-22 DIAGNOSIS — J449 Chronic obstructive pulmonary disease, unspecified: Secondary | ICD-10-CM | POA: Diagnosis not present

## 2017-12-29 DIAGNOSIS — N3941 Urge incontinence: Secondary | ICD-10-CM | POA: Diagnosis not present

## 2017-12-29 DIAGNOSIS — G4733 Obstructive sleep apnea (adult) (pediatric): Secondary | ICD-10-CM | POA: Diagnosis not present

## 2018-01-08 DIAGNOSIS — G4733 Obstructive sleep apnea (adult) (pediatric): Secondary | ICD-10-CM | POA: Diagnosis not present

## 2018-01-08 DIAGNOSIS — J449 Chronic obstructive pulmonary disease, unspecified: Secondary | ICD-10-CM | POA: Diagnosis not present

## 2018-01-15 DIAGNOSIS — H2513 Age-related nuclear cataract, bilateral: Secondary | ICD-10-CM | POA: Diagnosis not present

## 2018-01-15 DIAGNOSIS — H16213 Exposure keratoconjunctivitis, bilateral: Secondary | ICD-10-CM | POA: Diagnosis not present

## 2018-01-15 DIAGNOSIS — H16223 Keratoconjunctivitis sicca, not specified as Sjogren's, bilateral: Secondary | ICD-10-CM | POA: Diagnosis not present

## 2018-01-21 DIAGNOSIS — R42 Dizziness and giddiness: Secondary | ICD-10-CM | POA: Diagnosis not present

## 2018-01-21 DIAGNOSIS — G4733 Obstructive sleep apnea (adult) (pediatric): Secondary | ICD-10-CM | POA: Insufficient documentation

## 2018-01-21 DIAGNOSIS — M26623 Arthralgia of bilateral temporomandibular joint: Secondary | ICD-10-CM | POA: Insufficient documentation

## 2018-01-21 DIAGNOSIS — H9209 Otalgia, unspecified ear: Secondary | ICD-10-CM | POA: Diagnosis not present

## 2018-01-27 DIAGNOSIS — H903 Sensorineural hearing loss, bilateral: Secondary | ICD-10-CM | POA: Diagnosis not present

## 2018-01-27 DIAGNOSIS — R42 Dizziness and giddiness: Secondary | ICD-10-CM | POA: Diagnosis not present

## 2018-02-02 DIAGNOSIS — G4733 Obstructive sleep apnea (adult) (pediatric): Secondary | ICD-10-CM | POA: Diagnosis not present

## 2018-02-02 DIAGNOSIS — J449 Chronic obstructive pulmonary disease, unspecified: Secondary | ICD-10-CM | POA: Diagnosis not present

## 2018-02-02 NOTE — Progress Notes (Signed)
Referring-Richard Ramos, MD Reason for referral-chest pain  HPI: 62 year old female for evaluation of chest pain at request of Suella Broad, MD.  Seen in the emergency room June 10 with atypical chest pain.  Chest x-ray negative.  Troponins normal.  Abdominal ultrasound June 2019 showed increased liver echogenicity.  Patient has had intermittent chest pain since April.  It is substernal without radiation.  Described as a sharp pain.  Can last for hours.  No associated nausea, dyspnea or diaphoresis.  Some improvement with aspirin.  She does not have exertional chest pain.  Symptoms typically occur with stress.  She does have some dyspnea on exertion but no orthopnea, PND or pedal edema.  No syncope.  Cardiology now asked to evaluate.  Current Outpatient Medications  Medication Sig Dispense Refill  . albuterol (PROVENTIL HFA;VENTOLIN HFA) 108 (90 Base) MCG/ACT inhaler Inhale 2 puffs into the lungs every 4 (four) hours as needed for wheezing or shortness of breath. 1 Inhaler 0  . diazepam (VALIUM) 5 MG tablet Take 1 tablet (5 mg total) by mouth every 8 (eight) hours as needed (vertigo). (Patient taking differently: Take 5 mg by mouth 3 (three) times daily. ) 10 tablet 0  . esomeprazole (NEXIUM) 40 MG capsule Take 40 mg by mouth 2 (two) times daily before a meal.     . estrogens, conjugated, (PREMARIN) 0.3 MG tablet Take 0.3 mg by mouth daily. Take daily for 21 days then do not take for 7 days.    . fluticasone (FLONASE) 50 MCG/ACT nasal spray Place 1 spray into both nostrils daily as needed.  0  . ibuprofen (ADVIL,MOTRIN) 200 MG tablet Take 400 mg by mouth every 6 (six) hours as needed.    [name] ketoconazole (NIZORAL) 2 % shampoo Apply 1 application topically 2 (two) times a week.    . levocetirizine (XYZAL) 5 MG tablet Take 1 tablet by mouth daily.  1  . LYRICA 150 MG capsule Take 150 mg by mouth 2 (two) times daily.   1  . oxyCODONE (ROXICODONE) 15 MG immediate release tablet Take 7.5-15 mg by  mouth 4 (four) times daily.     . pravastatin (PRAVACHOL) 40 MG tablet Take 40 mg by mouth daily. Reported on 06/06/2015    . Umeclidinium-Vilanterol (ANORO ELLIPTA) 62.5-25 MCG/INH AEPB Inhale 1 puff into the lungs daily.    . vitamin B-12 1000 MCG tablet Take 1 tablet (1,000 mcg total) by mouth daily with breakfast. 30 tablet 0   No current facility-administered medications for this visit.     Allergies  Allergen Reactions  . Chantix [Varenicline] Other (See Comments)    Altered mental status-Per patient was put on allergy list by Dr Lorriane Shire bu patient staes she is not allergic to this and is currently taking it.   . Divalproex Sodium Other (See Comments)    Hallucinations  Other reaction(s): Confusion  . Pseudoeph-Hydrocodone-Gg Nausea Only  . Varenicline Tartrate     Other reaction(s): Confusion  . Ambien [Zolpidem] Other (See Comments)    Causes sleep walking  . Codeine Nausea And Vomiting  . Hydrocodone Nausea And Vomiting  . Paxil [Paroxetine Hcl] Other (See Comments)    Causes ringing in the ears.   . Sulfur Other (See Comments)    Swelling of tongue  . Amoxicillin Rash     Past Medical History:  Diagnosis Date  . Arthritis    hands and knees  . Bipolar 1 disorder (HCC) Degenerative disk disease  . Borderline  diabetic   . Chronic back pain   . Chronic neck pain   . Complication of anesthesia    high anxiety-does not want to be alone  . COPD (chronic obstructive pulmonary disease) ([place])   . Current use of estrogen therapy 01/12/2014  . Depression   . Headache   . Hot flashes 12/15/2013  . Hypertension   . IBS (irritable bowel syndrome)   . Incontinence   . Kidney stone   . 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 Panic attacks   . Peptic ulcer   . Peripheral neuropathy   . Rosacea   . Shingles   . Sleep apnea    uses a cpap-with oxygen    Past Surgical History:  Procedure Laterality Date  . ABDOMINAL HYSTERECTOMY    . BUNIONECTOMY WITH HAMMERTOE  RECONSTRUCTION Right 12/10/2012   Procedure: RIGHT FIRST METATARSAL CHEVRON BUNION CORRECTION,  2 AND 3 HAMMERTOE CORRECTION , RIGHT 3 AND 4 TOE NAIL EXCISION ;  Surgeon: Wylene Simmer, MD;  Location: [place];  Service: Orthopedics;  Laterality: Right;  . FOOT ARTHRODESIS  2000   both feet  . RECTAL SURGERY    . TONSILLECTOMY      Social History   Socioeconomic History  . Marital status: Divorced    Spouse name: Not on file  . Number of children: 5  . Years of education: 9th  . Highest education level: Not on file  Occupational History  . Occupation: Disabled  Social Needs  . Financial resource strain: Not on file  . Food insecurity:    Worry: Not on file    Inability: Not on file  . Transportation needs:    Medical: Not on file    Non-medical: Not on file  Tobacco Use  . Smoking status: Former Smoker    Packs/day: 0.50    Years: 35.00    Pack years: 17.50    Types: Cigarettes    Last attempt to quit: 10/19/2012    Years since quitting: 5.3  . Smokeless tobacco: Never Used  Substance and Sexual Activity  . Alcohol use: No    Alcohol/week: 0.0 standard drinks  . Drug use: No  . Sexual activity: Never    Birth control/protection: Surgical    Comment: occ cigarette  Lifestyle  . Physical activity:    Days per week: Not on file    Minutes per session: Not on file  . Stress: Not on file  Relationships  . Social connections:    Talks on phone: Not on file    Gets together: Not on file    Attends religious service: Not on file    Active member of club or organization: Not on file    Attends meetings of clubs or organizations: Not on file    Relationship status: Not on file  . Intimate partner violence:    Fear of current or ex partner: Not on file    Emotionally abused: Not on file    Physically abused: Not on file    Forced sexual activity: Not on file  Other Topics Concern  . Not on file  Social History Narrative   Lives at home with home with  her son (has five children).   Right-handed.   1 cup caffeine per day.    Family History  Problem Relation Age of Onset  . Diabetes Mother   . Other Mother        vertigo; chronic eye disease  . COPD  Father   . COPD Sister   . Alcohol abuse Brother   . Diabetes Maternal Grandmother   . Diabetes Maternal Grandfather   . Diabetes Sister   . Other Sister        hearing problems  . Hyperlipidemia Sister   . Alcohol abuse Brother   . COPD Brother   . Alcohol abuse Brother   . Alcohol abuse Brother   . Other Brother        aneursym    ROS: no fevers or chills, productive cough, hemoptysis, dysphasia, odynophagia, melena, hematochezia, dysuria, hematuria, rash, seizure activity, orthopnea, PND, pedal edema, claudication. Remaining systems are negative.  Physical Exam:   Blood pressure 130/82, pulse 73, height 5\' 9"  (1.753 m), weight 169 lb (76.7 kg).  General:  Well developed/well nourished in NAD Skin warm/dry Patient not depressed No peripheral clubbing Back-normal HEENT-normal/normal eyelids Neck supple/normal carotid upstroke bilaterally; no bruits; no JVD; no thyromegaly chest - CTA/ normal expansion CV - RRR/normal S1 and S2; no murmurs, rubs or gallops;  PMI nondisplaced Abdomen -NT/ND, no HSM, no mass, + bowel sounds, no bruit 2+ femoral pulses, no bruits Ext-no edema, chords, 2+ DP Neuro-grossly nonfocal  ECG -November 17, 2017-sinus rhythm with no ST changes.  Personally reviewed Sinus rhythm with occasional PAC.  No ST changes. A/P  1 chest pain-symptoms are atypical.  Some reproduction with palpation.  Question musculoskeletal.  Previous troponin normal.  Electrocardiogram shows no acute ST changes.  We will arrange an echocardiogram to assess LV function.  I will also arrange an exercise treadmill for risk stratification.  2 dyspnea-some dyspnea on exertion.  Echocardiogram to assess LV function.  3 hyperlipidemia-continue statin.  Followed by primary  care.  Kirk Ruths, MD

## 2018-02-05 ENCOUNTER — Ambulatory Visit (INDEPENDENT_AMBULATORY_CARE_PROVIDER_SITE_OTHER): Payer: BLUE CROSS/BLUE SHIELD | Admitting: Cardiology

## 2018-02-05 ENCOUNTER — Encounter

## 2018-02-05 ENCOUNTER — Encounter: Payer: Self-pay | Admitting: Cardiology

## 2018-02-05 VITALS — BP 130/82 | HR 73 | Ht 69.0 in | Wt 169.0 lb

## 2018-02-05 DIAGNOSIS — E7800 Pure hypercholesterolemia, unspecified: Secondary | ICD-10-CM

## 2018-02-05 DIAGNOSIS — R0609 Other forms of dyspnea: Secondary | ICD-10-CM

## 2018-02-05 DIAGNOSIS — R072 Precordial pain: Secondary | ICD-10-CM

## 2018-02-05 DIAGNOSIS — R0600 Dyspnea, unspecified: Secondary | ICD-10-CM

## 2018-02-05 NOTE — Patient Instructions (Signed)
Medication Instructions:   NO CHANGE  Testing/Procedures:  Your physician has requested that you have an echocardiogram. Echocardiography is a painless test that uses sound waves to create images of your heart. It provides your doctor with information about the size and shape of your heart and how well your heart's chambers and valves are working. This procedure takes approximately one hour. There are no restrictions for this procedure.   Your physician has requested that you have an exercise tolerance test. For further information please visit [email or web address]. Please also follow instruction sheet, as given.    Follow-Up:  Your physician recommends that you schedule a follow-up appointment in: AS NEEDED PENDING TEST RESULTS

## 2018-02-08 DIAGNOSIS — J449 Chronic obstructive pulmonary disease, unspecified: Secondary | ICD-10-CM | POA: Diagnosis not present

## 2018-02-17 ENCOUNTER — Ambulatory Visit (HOSPITAL_COMMUNITY): Payer: BLUE CROSS/BLUE SHIELD | Attending: Cardiology

## 2018-02-17 ENCOUNTER — Other Ambulatory Visit: Payer: Self-pay

## 2018-02-17 ENCOUNTER — Ambulatory Visit (INDEPENDENT_AMBULATORY_CARE_PROVIDER_SITE_OTHER): Payer: BLUE CROSS/BLUE SHIELD

## 2018-02-17 DIAGNOSIS — R072 Precordial pain: Secondary | ICD-10-CM | POA: Insufficient documentation

## 2018-02-17 DIAGNOSIS — G4730 Sleep apnea, unspecified: Secondary | ICD-10-CM | POA: Diagnosis not present

## 2018-02-17 DIAGNOSIS — J449 Chronic obstructive pulmonary disease, unspecified: Secondary | ICD-10-CM | POA: Diagnosis not present

## 2018-02-17 DIAGNOSIS — I119 Hypertensive heart disease without heart failure: Secondary | ICD-10-CM | POA: Insufficient documentation

## 2018-02-18 LAB — EXERCISE TOLERANCE TEST
CHL CUP MPHR: 158 {beats}/min
CSEPEDS: 50 s
CSEPHR: 86 %
Estimated workload: 9.3 METS
Exercise duration (min): 7 min
Peak HR: 136 {beats}/min
RPE: 17
Rest HR: 82 {beats}/min

## 2018-02-26 DIAGNOSIS — R350 Frequency of micturition: Secondary | ICD-10-CM | POA: Diagnosis not present

## 2018-02-26 DIAGNOSIS — M2550 Pain in unspecified joint: Secondary | ICD-10-CM | POA: Diagnosis not present

## 2018-02-26 DIAGNOSIS — Z0000 Encounter for general adult medical examination without abnormal findings: Secondary | ICD-10-CM | POA: Diagnosis not present

## 2018-02-26 DIAGNOSIS — E7800 Pure hypercholesterolemia, unspecified: Secondary | ICD-10-CM | POA: Diagnosis not present

## 2018-02-26 DIAGNOSIS — Z79899 Other long term (current) drug therapy: Secondary | ICD-10-CM | POA: Diagnosis not present

## 2018-02-26 DIAGNOSIS — G629 Polyneuropathy, unspecified: Secondary | ICD-10-CM | POA: Diagnosis not present

## 2018-02-26 DIAGNOSIS — F319 Bipolar disorder, unspecified: Secondary | ICD-10-CM | POA: Diagnosis not present

## 2018-02-26 DIAGNOSIS — R6883 Chills (without fever): Secondary | ICD-10-CM | POA: Diagnosis not present

## 2018-02-26 DIAGNOSIS — J449 Chronic obstructive pulmonary disease, unspecified: Secondary | ICD-10-CM | POA: Diagnosis not present

## 2018-02-26 DIAGNOSIS — I10 Essential (primary) hypertension: Secondary | ICD-10-CM | POA: Diagnosis not present

## 2018-03-10 DIAGNOSIS — G4733 Obstructive sleep apnea (adult) (pediatric): Secondary | ICD-10-CM | POA: Diagnosis not present

## 2018-03-10 DIAGNOSIS — J449 Chronic obstructive pulmonary disease, unspecified: Secondary | ICD-10-CM | POA: Diagnosis not present

## 2018-03-10 HISTORY — PX: LIPOSUCTION: SHX10

## 2018-03-11 DIAGNOSIS — Z23 Encounter for immunization: Secondary | ICD-10-CM | POA: Diagnosis not present

## 2018-03-16 DIAGNOSIS — Z1283 Encounter for screening for malignant neoplasm of skin: Secondary | ICD-10-CM | POA: Diagnosis not present

## 2018-03-16 DIAGNOSIS — D225 Melanocytic nevi of trunk: Secondary | ICD-10-CM | POA: Diagnosis not present

## 2018-03-16 DIAGNOSIS — L218 Other seborrheic dermatitis: Secondary | ICD-10-CM | POA: Diagnosis not present

## 2018-03-16 DIAGNOSIS — L718 Other rosacea: Secondary | ICD-10-CM | POA: Diagnosis not present

## 2018-03-17 DIAGNOSIS — G4733 Obstructive sleep apnea (adult) (pediatric): Secondary | ICD-10-CM | POA: Diagnosis not present

## 2018-03-17 DIAGNOSIS — J449 Chronic obstructive pulmonary disease, unspecified: Secondary | ICD-10-CM | POA: Diagnosis not present

## 2018-03-19 DIAGNOSIS — J449 Chronic obstructive pulmonary disease, unspecified: Secondary | ICD-10-CM | POA: Diagnosis not present

## 2018-03-19 DIAGNOSIS — G4733 Obstructive sleep apnea (adult) (pediatric): Secondary | ICD-10-CM | POA: Diagnosis not present

## 2018-03-21 DIAGNOSIS — J449 Chronic obstructive pulmonary disease, unspecified: Secondary | ICD-10-CM | POA: Diagnosis not present

## 2018-03-23 DIAGNOSIS — J449 Chronic obstructive pulmonary disease, unspecified: Secondary | ICD-10-CM | POA: Diagnosis not present

## 2018-03-23 DIAGNOSIS — R350 Frequency of micturition: Secondary | ICD-10-CM | POA: Diagnosis not present

## 2018-03-23 DIAGNOSIS — R062 Wheezing: Secondary | ICD-10-CM | POA: Diagnosis not present

## 2018-03-23 DIAGNOSIS — R05 Cough: Secondary | ICD-10-CM | POA: Diagnosis not present

## 2018-03-23 DIAGNOSIS — J22 Unspecified acute lower respiratory infection: Secondary | ICD-10-CM | POA: Diagnosis not present

## 2018-03-26 DIAGNOSIS — G4733 Obstructive sleep apnea (adult) (pediatric): Secondary | ICD-10-CM | POA: Diagnosis not present

## 2018-03-26 DIAGNOSIS — N3941 Urge incontinence: Secondary | ICD-10-CM | POA: Diagnosis not present

## 2018-03-27 DIAGNOSIS — M25542 Pain in joints of left hand: Secondary | ICD-10-CM | POA: Diagnosis not present

## 2018-03-27 DIAGNOSIS — M25541 Pain in joints of right hand: Secondary | ICD-10-CM | POA: Diagnosis not present

## 2018-03-27 DIAGNOSIS — M79604 Pain in right leg: Secondary | ICD-10-CM | POA: Diagnosis not present

## 2018-03-27 DIAGNOSIS — J449 Chronic obstructive pulmonary disease, unspecified: Secondary | ICD-10-CM | POA: Diagnosis not present

## 2018-03-30 ENCOUNTER — Other Ambulatory Visit (HOSPITAL_COMMUNITY): Payer: Self-pay | Admitting: Family Medicine

## 2018-03-30 DIAGNOSIS — Z1231 Encounter for screening mammogram for malignant neoplasm of breast: Secondary | ICD-10-CM

## 2018-03-31 DIAGNOSIS — M25561 Pain in right knee: Secondary | ICD-10-CM | POA: Diagnosis not present

## 2018-03-31 DIAGNOSIS — N3941 Urge incontinence: Secondary | ICD-10-CM | POA: Diagnosis not present

## 2018-03-31 DIAGNOSIS — M5136 Other intervertebral disc degeneration, lumbar region: Secondary | ICD-10-CM | POA: Diagnosis not present

## 2018-03-31 DIAGNOSIS — G4733 Obstructive sleep apnea (adult) (pediatric): Secondary | ICD-10-CM | POA: Diagnosis not present

## 2018-03-31 DIAGNOSIS — M5030 Other cervical disc degeneration, unspecified cervical region: Secondary | ICD-10-CM | POA: Diagnosis not present

## 2018-03-31 DIAGNOSIS — M25562 Pain in left knee: Secondary | ICD-10-CM | POA: Diagnosis not present

## 2018-04-03 DIAGNOSIS — D485 Neoplasm of uncertain behavior of skin: Secondary | ICD-10-CM | POA: Diagnosis not present

## 2018-04-05 ENCOUNTER — Emergency Department (HOSPITAL_COMMUNITY)
Admission: EM | Admit: 2018-04-05 | Discharge: 2018-04-05 | Disposition: A | Discharge status: Home / Self Care | Payer: BLUE CROSS/BLUE SHIELD | Attending: Emergency Medicine | Admitting: Emergency Medicine

## 2018-04-05 ENCOUNTER — Emergency Department (HOSPITAL_COMMUNITY)
Admission: EM | Admit: 2018-04-05 | Discharge: 2018-04-05 | Disposition: A | Discharge status: Home / Self Care | Payer: BLUE CROSS/BLUE SHIELD | Source: Home / Self Care | Attending: Emergency Medicine | Admitting: Emergency Medicine

## 2018-04-05 ENCOUNTER — Other Ambulatory Visit: Payer: Self-pay

## 2018-04-05 ENCOUNTER — Encounter (HOSPITAL_COMMUNITY): Payer: Self-pay | Admitting: Emergency Medicine

## 2018-04-05 DIAGNOSIS — T8130XA Disruption of wound, unspecified, initial encounter: Secondary | ICD-10-CM | POA: Diagnosis not present

## 2018-04-05 DIAGNOSIS — Y829 Unspecified medical devices associated with adverse incidents: Secondary | ICD-10-CM | POA: Insufficient documentation

## 2018-04-05 DIAGNOSIS — Z87891 Personal history of nicotine dependence: Secondary | ICD-10-CM | POA: Insufficient documentation

## 2018-04-05 DIAGNOSIS — T8131XA Disruption of external operation (surgical) wound, not elsewhere classified, initial encounter: Secondary | ICD-10-CM | POA: Insufficient documentation

## 2018-04-05 DIAGNOSIS — J449 Chronic obstructive pulmonary disease, unspecified: Secondary | ICD-10-CM | POA: Insufficient documentation

## 2018-04-05 DIAGNOSIS — Z5321 Procedure and treatment not carried out due to patient leaving prior to being seen by health care provider: Secondary | ICD-10-CM | POA: Diagnosis not present

## 2018-04-05 DIAGNOSIS — Z79899 Other long term (current) drug therapy: Secondary | ICD-10-CM | POA: Insufficient documentation

## 2018-04-05 DIAGNOSIS — Y69 Unspecified misadventure during surgical and medical care: Secondary | ICD-10-CM

## 2018-04-05 DIAGNOSIS — Z7902 Long term (current) use of antithrombotics/antiplatelets: Secondary | ICD-10-CM

## 2018-04-05 MED ORDER — ONDANSETRON 4 MG PO TBDP
4.0000 mg | ORAL_TABLET | Freq: Once | ORAL | Status: AC
  Administered 2018-04-05: 4 mg via ORAL
  Filled 2018-04-05: qty 1

## 2018-04-05 NOTE — ED Triage Notes (Signed)
Pt had a plastic surgery procedure on her vagina last week. Pt states that there is now a hole in the incision site. Pt states that there is also a drain "down there."

## 2018-04-05 NOTE — ED Triage Notes (Signed)
PT states she had plastic surgery for vaginal reconstruction this past week. PT states that sutures have opened up and some drainage that started yesterday.

## 2018-04-05 NOTE — ED Provider Notes (Signed)
[facility] EMERGENCY DEPARTMENT Provider Note   CSN: [id number] Arrival date & time: 04/05/18  1040     History   Chief Complaint Chief Complaint  Patient presents with  . Follow-up    HPI Cynthia Deleon is a 62 y.o. female.  62 y.o female with a PMH of Bipolar, HTN, COPD presents to the ED with a  Chief complaint of surgical wound complication. Patient had a vaginal rejuvenation procedure done by Dr. Junie Bame on Thursday 04/02/2018.  Reports a friend of hers was changing her bandages and when they they alarmed her that she needed to come to the ED in order to be checked up as the wound did not look normal.  Patient reports she has done no activity to aggravate the situation and reports she is been compliant with her postop notes.  Denies any fevers, urinary complaints, bowel complaints.     Past Medical History:  Diagnosis Date  . Arthritis    hands and knees  . Bipolar 1 disorder (HCC) Degenerative disk disease  . Borderline diabetic   . Chronic back pain   . Chronic neck pain   . Complication of anesthesia    high anxiety-does not want to be alone  . COPD (chronic obstructive pulmonary disease) ([place])   . Current use of estrogen therapy 01/12/2014  . Depression   . Headache   . Hot flashes 12/15/2013  . Hypertension   . IBS (irritable bowel syndrome)   . Incontinence   . Kidney stone   . 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 Panic attacks   . Peptic ulcer   . Peripheral neuropathy   . Rosacea   . Shingles   . Sleep apnea    uses a cpap-with oxygen    Patient Active Problem List   Diagnosis Date Noted  . Other abnormal glucose 04/17/2016  . OCD (obsessive compulsive disorder) 11/15/2015  . Tobacco use disorder 11/15/2015  . Peripheral neuropathy 10/10/2015  . Current use of estrogen therapy 01/12/2014  . PTSD (post-traumatic stress disorder) 08/03/2011  . Bipolar I disorder, most recent episode mixed, severe with psychotic features [facility])     Past  Surgical History:  Procedure Laterality Date  . ABDOMINAL HYSTERECTOMY    . BUNIONECTOMY WITH HAMMERTOE RECONSTRUCTION Right 12/10/2012   Procedure: RIGHT FIRST METATARSAL CHEVRON BUNION CORRECTION,  2 AND 3 HAMMERTOE CORRECTION , RIGHT 3 AND 4 TOE NAIL EXCISION ;  Surgeon: Wylene Simmer, MD;  Location: [facility];  Service: Orthopedics;  Laterality: Right;  . FOOT ARTHRODESIS  2000   both feet  . RECTAL SURGERY    . TONSILLECTOMY       OB History    Gravida  5   Para  5   Term      Preterm      AB      Living  5     SAB      TAB      Ectopic      Multiple      Live Births               Home Medications    Prior to Admission medications   Medication Sig Start Date End Date Taking? Authorizing Provider  albuterol (PROVENTIL HFA;VENTOLIN HFA) 108 (90 Base) MCG/ACT inhaler Inhale 2 puffs into the lungs every 4 (four) hours as needed for wheezing or shortness of breath. 06/06/15   Francine Graven, DO  diazepam (VALIUM) 5 MG tablet Take  1 tablet (5 mg total) by mouth every 8 (eight) hours as needed (vertigo). Patient taking differently: Take 5 mg by mouth 3 (three) times daily.  06/14/14   Tanna Furry, MD  esomeprazole (NEXIUM) 40 MG capsule Take 40 mg by mouth 2 (two) times daily before a meal.     [provider]  estrogens, conjugated, (PREMARIN) 0.3 MG tablet Take 0.3 mg by mouth daily. Take daily for 21 days then do not take for 7 days.    [provider]  fluticasone (FLONASE) 50 MCG/ACT nasal spray Place 1 spray into both nostrils daily as needed. 10/29/17   [provider]  ibuprofen (ADVIL,MOTRIN) 200 MG tablet Take 400 mg by mouth every 6 (six) hours as needed.    [provider]  ketoconazole (NIZORAL) 2 % shampoo Apply 1 application topically 2 (two) times a week.    [provider]  levocetirizine (XYZAL) 5 MG tablet Take 1 tablet by mouth daily. 10/29/17   [provider]  LYRICA 150 MG  capsule Take 150 mg by mouth 2 (two) times daily.  11/06/16   [provider]  oxyCODONE (ROXICODONE) 15 MG immediate release tablet Take 7.5-15 mg by mouth 4 (four) times daily.     [provider]  pravastatin (PRAVACHOL) 40 MG tablet Take 40 mg by mouth daily. Reported on 06/06/2015    [provider]  Umeclidinium-Vilanterol (ANORO ELLIPTA) 62.5-25 MCG/INH AEPB Inhale 1 puff into the lungs daily.    [provider]  vitamin B-12 1000 MCG tablet Take 1 tablet (1,000 mcg total) by mouth daily with breakfast. 11/22/15   Pucilowska, Wardell Honour, MD    Family History Family History  Problem Relation Age of Onset  . Diabetes Mother   . Other Mother        vertigo; chronic eye disease  . COPD Father   . COPD Sister   . Alcohol abuse Brother   . Diabetes Maternal Grandmother   . Diabetes Maternal Grandfather   . Diabetes Sister   . Other Sister        hearing problems  . Hyperlipidemia Sister   . Alcohol abuse Brother   . COPD Brother   . Alcohol abuse Brother   . Alcohol abuse Brother   . Other Brother        aneursym    Social History Social History   Tobacco Use  . Smoking status: Former Smoker    Packs/day: 0.50    Years: 35.00    Pack years: 17.50    Types: Cigarettes    Last attempt to quit: 10/19/2012    Years since quitting: 5.4  . Smokeless tobacco: Never Used  Substance Use Topics  . Alcohol use: No    Alcohol/week: 0.0 standard drinks  . Drug use: No     Allergies   Chantix [varenicline]; Divalproex sodium; Pseudoeph-hydrocodone-gg; Varenicline tartrate; Ambien [zolpidem]; Codeine; Hydrocodone; Paxil [paroxetine hcl]; Sulfur; and Amoxicillin   Review of Systems Review of Systems  Constitutional: Negative for fever.  Gastrointestinal: Negative for abdominal pain, diarrhea, nausea and vomiting.  Genitourinary: Negative for dysuria and hematuria.  Skin: Positive for wound.     Physical Exam Updated Vital Signs BP  135/67 (BP Location: Left Arm)   Pulse 94   Temp 97.9 F (36.6 C) (Oral)   Resp 18   Ht 5\' 9"  (1.753 m)   Wt 75.8 kg   SpO2 94%   BMI 24.66 kg/m   Physical Exam  Constitutional: She is oriented to person, place, and time. She appears well-developed and well-nourished.  HENT:  Head: Normocephalic and atraumatic.  Neck: Normal range of motion. Neck supple.  Cardiovascular: Normal heart sounds.  Pulmonary/Chest: Effort normal.  Abdominal: Soft.    Neurological: She is alert and oriented to person, place, and time.  Skin: Skin is warm and dry.  Nursing note and vitals reviewed.        ED Treatments / Results  Labs (all labs ordered are listed, but only abnormal results are displayed) Labs Reviewed - No data to display  EKG None  Radiology No results found.  Procedures Procedures (including critical care time)  Medications Ordered in ED Medications  ondansetron (ZOFRAN-ODT) disintegrating tablet 4 mg (4 mg Oral Given 04/05/18 1226)     Initial Impression / Assessment and Plan / ED Course  I have reviewed the triage vital signs and the nursing notes.  Pertinent labs & imaging results that were available during my care of the patient were reviewed by me and considered in my medical decision making (see chart for details).    Patient presents with a wound complication from her surgery done on Thursday.  Patient reports Dr. Towanda Malkin did the surgery for her vaginal rejuvenation.  She reports she has done no strenuous activities in order to cause this, when she was having her bandages changed by a friend they noted that her wound was open.  Please see pictures attached to my chart. 1319 PM 02 [place] at [facility] sticks answering service will page Dr. Towanda Malkin. 1400 PM spoke to Dr. Towanda Malkin who reports patient's needs to be placed on a sterile dressing and follow-up with him in office tomorrow at 4 PM.  She is to call the office in the morning in order to schedule  this appointment.I have spoken to patient about this, she understand and agrees with plan. Return precautions provided.   Final Clinical Impressions(s) / ED Diagnoses   Final diagnoses:  Dehiscence of operative wound, initial encounter    ED Discharge Orders    None       Janeece Fitting, PA-C 04/05/18 1413    Fredia Sorrow, MD 04/05/18 [phone]

## 2018-04-05 NOTE — Discharge Instructions (Addendum)
Please call Dr. Junie Bame tomorrow morning in order to schedule your appointment to see him at 4 PM tomorrow in office.

## 2018-04-05 NOTE — ED Provider Notes (Signed)
Medical screening examination/treatment/procedure(s) were conducted as a shared visit with non-physician practitioner(s) and myself.  I personally evaluated the patient during the encounter.  None   Patient seen by me along with the physician assistant.  Patient status post a revision of a tummy tuck recently by plastic surgery.  Was in the suprapubic area.  This was done last week.  Patient states that the wound opened up here today she tried to call her plastic surgeon but could not get direct information.  Patient denies any purulent discharge.  Patient denies any injury to the area.  Pictures taken by the physician assistant are very helpful.  There is a Penrose drain in there that has now flopped into the wound the opening of the wound of the dehiscence is measuring about 4 to 5 cm.  We did get a hold of her plastic surgeon recommend sterile dressing will do a little bit of moist gauze in that area so it does not dry out and then put a dry dressing over top of it.  No evidence of any significant infection patient nontoxic patient stable for discharge home.  Plastic surgery will see her in the office tomorrow.   Fredia Sorrow, MD 04/05/18 [phone]

## 2018-04-06 DIAGNOSIS — T8131XA Disruption of external operation (surgical) wound, not elsewhere classified, initial encounter: Secondary | ICD-10-CM | POA: Diagnosis not present

## 2018-04-09 DIAGNOSIS — F319 Bipolar disorder, unspecified: Secondary | ICD-10-CM | POA: Diagnosis not present

## 2018-04-09 DIAGNOSIS — J209 Acute bronchitis, unspecified: Secondary | ICD-10-CM | POA: Diagnosis not present

## 2018-04-09 DIAGNOSIS — K219 Gastro-esophageal reflux disease without esophagitis: Secondary | ICD-10-CM | POA: Diagnosis not present

## 2018-04-10 DIAGNOSIS — J449 Chronic obstructive pulmonary disease, unspecified: Secondary | ICD-10-CM | POA: Diagnosis not present

## 2018-04-10 DIAGNOSIS — G4733 Obstructive sleep apnea (adult) (pediatric): Secondary | ICD-10-CM | POA: Diagnosis not present

## 2018-04-18 DIAGNOSIS — J449 Chronic obstructive pulmonary disease, unspecified: Secondary | ICD-10-CM | POA: Diagnosis not present

## 2018-04-18 DIAGNOSIS — G4733 Obstructive sleep apnea (adult) (pediatric): Secondary | ICD-10-CM | POA: Diagnosis not present

## 2018-04-21 ENCOUNTER — Other Ambulatory Visit: Payer: Self-pay

## 2018-04-21 DIAGNOSIS — G894 Chronic pain syndrome: Secondary | ICD-10-CM | POA: Diagnosis not present

## 2018-04-21 DIAGNOSIS — E1142 Type 2 diabetes mellitus with diabetic polyneuropathy: Secondary | ICD-10-CM | POA: Diagnosis not present

## 2018-04-21 DIAGNOSIS — G4733 Obstructive sleep apnea (adult) (pediatric): Secondary | ICD-10-CM | POA: Diagnosis not present

## 2018-04-21 DIAGNOSIS — K219 Gastro-esophageal reflux disease without esophagitis: Secondary | ICD-10-CM | POA: Diagnosis not present

## 2018-04-21 DIAGNOSIS — I872 Venous insufficiency (chronic) (peripheral): Secondary | ICD-10-CM

## 2018-04-22 ENCOUNTER — Encounter (HOSPITAL_COMMUNITY): Payer: Self-pay

## 2018-04-22 ENCOUNTER — Emergency Department (HOSPITAL_COMMUNITY): Payer: BLUE CROSS/BLUE SHIELD

## 2018-04-22 ENCOUNTER — Emergency Department (HOSPITAL_COMMUNITY)
Admission: EM | Admit: 2018-04-22 | Discharge: 2018-04-22 | Disposition: A | Discharge status: Home / Self Care | Payer: BLUE CROSS/BLUE SHIELD | Attending: Emergency Medicine | Admitting: Emergency Medicine

## 2018-04-22 ENCOUNTER — Other Ambulatory Visit: Payer: Self-pay

## 2018-04-22 DIAGNOSIS — R102 Pelvic and perineal pain: Secondary | ICD-10-CM | POA: Insufficient documentation

## 2018-04-22 DIAGNOSIS — Z87891 Personal history of nicotine dependence: Secondary | ICD-10-CM | POA: Diagnosis not present

## 2018-04-22 DIAGNOSIS — R110 Nausea: Secondary | ICD-10-CM | POA: Insufficient documentation

## 2018-04-22 DIAGNOSIS — T148XXA Other injury of unspecified body region, initial encounter: Secondary | ICD-10-CM

## 2018-04-22 DIAGNOSIS — R1030 Lower abdominal pain, unspecified: Secondary | ICD-10-CM | POA: Diagnosis not present

## 2018-04-22 DIAGNOSIS — R32 Unspecified urinary incontinence: Secondary | ICD-10-CM | POA: Insufficient documentation

## 2018-04-22 DIAGNOSIS — G8918 Other acute postprocedural pain: Secondary | ICD-10-CM | POA: Insufficient documentation

## 2018-04-22 DIAGNOSIS — S31109A Unspecified open wound of abdominal wall, unspecified quadrant without penetration into peritoneal cavity, initial encounter: Secondary | ICD-10-CM | POA: Diagnosis not present

## 2018-04-22 DIAGNOSIS — Z79899 Other long term (current) drug therapy: Secondary | ICD-10-CM | POA: Diagnosis not present

## 2018-04-22 DIAGNOSIS — R52 Pain, unspecified: Secondary | ICD-10-CM

## 2018-04-22 LAB — BASIC METABOLIC PANEL
Anion gap: 8 (ref 5–15)
BUN: 15 mg/dL (ref 8–23)
CO2: 28 mmol/L (ref 22–32)
CREATININE: 0.84 mg/dL (ref 0.44–1.00)
Calcium: 9 mg/dL (ref 8.9–10.3)
Chloride: 103 mmol/L (ref 98–111)
GFR calc Af Amer: >60 mL/min (ref >60)
GFR calc non Af Amer: >60 mL/min (ref >60)
Glucose, Bld: 100 mg/dL — ABNORMAL HIGH (ref 70–99)
POTASSIUM: 3.9 mmol/L (ref 3.5–5.1)
SODIUM: 139 mmol/L (ref 135–145)

## 2018-04-22 LAB — CBC WITH DIFFERENTIAL/PLATELET
ABS IMMATURE GRANULOCYTES: 0.03 10*3/uL (ref 0.00–0.07)
Basophils Absolute: 0.1 10*3/uL (ref 0.0–0.1)
Basophils Relative: 1 %
EOS PCT: 2 %
Eosinophils Absolute: 0.2 10*3/uL (ref 0.0–0.5)
HCT: 47.3 % — ABNORMAL HIGH (ref 36.0–46.0)
HEMOGLOBIN: 15.2 g/dL — AB (ref 12.0–15.0)
Immature Granulocytes: 0 %
Lymphocytes Relative: 31 %
Lymphs Abs: 2.4 10*3/uL (ref 0.7–4.0)
MCH: 30.7 pg (ref 26.0–34.0)
MCHC: 32.1 g/dL (ref 30.0–36.0)
MCV: 95.6 fL (ref 80.0–100.0)
MONO ABS: 0.4 10*3/uL (ref 0.1–1.0)
Monocytes Relative: 5 %
NEUTROS ABS: 4.8 10*3/uL (ref 1.7–7.7)
Neutrophils Relative %: 61 %
PLATELETS: 221 10*3/uL (ref 150–400)
RBC: 4.95 MIL/uL (ref 3.87–5.11)
RDW: 13.2 % (ref 11.5–15.5)
WBC: 7.8 10*3/uL (ref 4.0–10.5)
nRBC: 0 % (ref 0.0–0.2)

## 2018-04-22 LAB — URINALYSIS, ROUTINE W REFLEX MICROSCOPIC
Bilirubin Urine: NEGATIVE
GLUCOSE, UA: NEGATIVE mg/dL
Hgb urine dipstick: NEGATIVE
KETONES UR: NEGATIVE mg/dL
Leukocytes, UA: NEGATIVE
NITRITE: NEGATIVE
PROTEIN: NEGATIVE mg/dL
Specific Gravity, Urine: 1.016 (ref 1.005–1.030)
pH: 7 (ref 5.0–8.0)

## 2018-04-22 MED ORDER — IOPAMIDOL (ISOVUE-300) INJECTION 61%
INTRAVENOUS | Status: AC
  Filled 2018-04-22: qty 100

## 2018-04-22 MED ORDER — IOPAMIDOL (ISOVUE-300) INJECTION 61%
100.0000 mL | Freq: Once | INTRAVENOUS | Status: AC | PRN
  Administered 2018-04-22: 100 mL via INTRAVENOUS

## 2018-04-22 MED ORDER — OXYCODONE HCL 5 MG PO TABS
7.5000 mg | ORAL_TABLET | Freq: Once | ORAL | Status: AC
  Administered 2018-04-22: 7.5 mg via ORAL
  Filled 2018-04-22: qty 2

## 2018-04-22 NOTE — Discharge Instructions (Signed)
Follow-up with your plastic surgeon tomorrow.  Call the office in the morning to set up an appointment to be seen later in the day. May continue to take your home pain medication, as previously prescribed. Return to the ED for fever, increased drainage from the wound, spreading redness, increased pain, or any other major concerns.

## 2018-04-22 NOTE — ED Triage Notes (Signed)
Patient reports that she had liposuction in October and is now having open wounds and states that the wound looks infected. Patient also c/o rash on her face and hands.

## 2018-04-22 NOTE — ED Provider Notes (Signed)
[facility] DEPT Provider Note   CSN: [id number] Arrival date & time: 04/22/18  1211     History   Chief Complaint Chief Complaint  Patient presents with  . wounds  . Abdominal Pain  . Rash    HPI Cynthia Deleon is a 62 y.o. female.  HPI   Cynthia Deleon is a 62 y.o. female, with a history of bipolar, COPD,, presenting to the ED with concern for abdominal wound infection.  States she underwent a "tummy tuck" procedure in 1994.  Since that time, she had "large bumps, maybe of fat, at the top of the vagina."  April 02, 2018 she had liposuction and surgery to remove "whatever was making the bumps and flatten the area out."  She had an incision in the suprapubic region.  Within 2 to 3 days of this latest surgery, the wound dehisced, which caused her to come to the emergency room. Following that visit to the ED, she was seen in her plastic surgeon's office the next day.  She states, "He seemed really mad at me that I came to the emergency room. He told me it must have been something I did for the wound to come open. He numbed me up and sewed the wound shut."  Patient was also placed in an abdominal binder.  She was placed on antibiotics, but does not remember what they were.  She states since that time she has had increased drainage from the wound as well as increased pain and redness.  She has pain in the lower abdomen, described as a soreness, moderate, nonradiating.  Accompanied by nausea and abdominal distension.  She also notes urinary incontinence that she states she thinks is due to the abdominal binder.  Denies fever, dysuria, hematuria, changes in stool consistency or appearance, generalized abdominal pain, vomiting, diarrhea, or any other complaints.    Past Medical History:  Diagnosis Date  . Arthritis    hands and knees  . Bipolar 1 disorder (HCC) Degenerative disk disease  . Borderline diabetic   . Chronic back pain   . Chronic  neck pain   . Complication of anesthesia    high anxiety-does not want to be alone  . COPD (chronic obstructive pulmonary disease) ([place])   . Current use of estrogen therapy 01/12/2014  . Depression   . Headache   . Hot flashes 12/15/2013  . Hypertension   . IBS (irritable bowel syndrome)   . Incontinence   . Kidney stone   . 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 Panic attacks   . Peptic ulcer   . Peripheral neuropathy   . Rosacea   . Shingles   . Sleep apnea    uses a cpap-with oxygen    Patient Active Problem List   Diagnosis Date Noted  . Other abnormal glucose 04/17/2016  . OCD (obsessive compulsive disorder) 11/15/2015  . Tobacco use disorder 11/15/2015  . Peripheral neuropathy 10/10/2015  . Current use of estrogen therapy 01/12/2014  . PTSD (post-traumatic stress disorder) 08/03/2011  . Bipolar I disorder, most recent episode mixed, severe with psychotic features [facility])     Past Surgical History:  Procedure Laterality Date  . ABDOMINAL HYSTERECTOMY    . BUNIONECTOMY WITH HAMMERTOE RECONSTRUCTION Right 12/10/2012   Procedure: RIGHT FIRST METATARSAL CHEVRON BUNION CORRECTION,  2 AND 3 HAMMERTOE CORRECTION , RIGHT 3 AND 4 TOE NAIL EXCISION ;  Surgeon: Wylene Simmer, MD;  Location: [facility];  Service: Orthopedics;  Laterality: Right;  . FOOT ARTHRODESIS  2000   both feet  . RECTAL SURGERY    . TONSILLECTOMY       OB History    Gravida  5   Para  5   Term      Preterm      AB      Living  5     SAB      TAB      Ectopic      Multiple      Live Births               Home Medications    Prior to Admission medications   Medication Sig Start Date End Date Taking? Authorizing Provider  albuterol (PROVENTIL HFA;VENTOLIN HFA) 108 (90 Base) MCG/ACT inhaler Inhale 2 puffs into the lungs every 4 (four) hours as needed for wheezing or shortness of breath. 06/06/15  Yes Francine Graven, DO  clonazePAM (KLONOPIN) 0.5 MG tablet Take 0.5 mg  by mouth 3 (three) times daily. 04/10/18  Yes [provider]  diazepam (VALIUM) 5 MG tablet Take 1 tablet (5 mg total) by mouth every 8 (eight) hours as needed (vertigo). Patient taking differently: Take 5 mg by mouth 3 (three) times daily.  06/14/14  Yes Tanna Furry, MD  esomeprazole (NEXIUM) 40 MG capsule Take 40 mg by mouth 2 (two) times daily before a meal.    Yes [provider]  estrogens, conjugated, (PREMARIN) 0.45 MG tablet Take 0.45 mg by mouth daily.    Yes [provider]  fluticasone (FLONASE) 50 MCG/ACT nasal spray Place 1 spray into both nostrils daily as needed. 10/29/17  Yes [provider]  ipratropium-albuterol (DUONEB) 0.5-2.5 (3) MG/3ML SOLN Inhale 3 mLs into the lungs 2 (two) times daily as needed for wheezing or shortness of breath. 04/08/18  Yes [provider]  lamoTRIgine (LAMICTAL) 100 MG tablet Take 100 mg by mouth daily. 04/14/18  Yes [provider]  lamoTRIgine (LAMICTAL) 25 MG tablet Take 25 mg by mouth daily. 04/10/18  Yes [provider]  oxybutynin (DITROPAN) 5 MG tablet Take 5 mg by mouth 2 (two) times daily. 04/17/18  Yes [provider]  oxyCODONE (ROXICODONE) 15 MG immediate release tablet Take 7.5 mg by mouth every 2 (two) hours.    Yes [provider]  pravastatin (PRAVACHOL) 40 MG tablet Take 40 mg by mouth daily. Reported on 06/06/2015   Yes [provider]  pregabalin (LYRICA) 200 MG capsule Take 200 mg by mouth 2 (two) times daily. 03/31/18  Yes [provider]  promethazine (PHENERGAN) 25 MG tablet Take 25 mg by mouth every 8 (eight) hours as needed for nausea. 04/06/18  Yes [provider]  triamcinolone cream (KENALOG) 0.1 % Apply 1 application topically daily as needed for rash. 04/09/18  Yes [provider]  umeclidinium-vilanterol (ANORO ELLIPTA) 62.5-25 MCG/INH AEPB Take by mouth. 01/16/17  Yes [provider]  valACYclovir (VALTREX)  500 MG tablet Take 500 mg by mouth daily as needed (shingles flare up).  03/30/18  Yes [provider]  vitamin B-12 1000 MCG tablet Take 1 tablet (1,000 mcg total) by mouth daily with breakfast. 11/22/15  Yes Pucilowska, Jolanta B, MD  VRAYLAR 4.5 MG CAPS Take 4.5 mg by mouth daily. 03/17/18  Yes [provider]    Family History Family History  Problem Relation Age of Onset  . Diabetes Mother   . Other Mother  vertigo; chronic eye disease  . COPD Father   . COPD Sister   . Alcohol abuse Brother   . Diabetes Maternal Grandmother   . Diabetes Maternal Grandfather   . Diabetes Sister   . Other Sister        hearing problems  . Hyperlipidemia Sister   . Alcohol abuse Brother   . COPD Brother   . Alcohol abuse Brother   . Alcohol abuse Brother   . Other Brother        aneursym    Social History Social History   Tobacco Use  . Smoking status: Former Smoker    Packs/day: 0.50    Years: 35.00    Pack years: 17.50    Types: Cigarettes    Last attempt to quit: 10/19/2012    Years since quitting: 5.5  . Smokeless tobacco: Never Used  Substance Use Topics  . Alcohol use: No    Alcohol/week: 0.0 standard drinks  . Drug use: No     Allergies   Chantix [varenicline]; Divalproex sodium; Pseudoeph-hydrocodone-gg; Sulfa antibiotics; Varenicline tartrate; Ambien [zolpidem]; Codeine; Hydrocodone; Paxil [paroxetine hcl]; Sulfur; and Amoxicillin   Review of Systems Review of Systems  Constitutional: Negative for chills and fever.  Respiratory: Negative for shortness of breath.   Cardiovascular: Negative for chest pain.  Gastrointestinal: Positive for abdominal pain and nausea. Negative for blood in stool, constipation, diarrhea and vomiting.  Genitourinary: Positive for pelvic pain. Negative for dysuria, frequency, hematuria, vaginal bleeding and vaginal discharge.  Skin: Positive for color change and wound.  All other systems reviewed and are  negative.    Physical Exam Updated Vital Signs BP 137/84 (BP Location: Left Arm)   Pulse 92   Temp 98 F (36.7 C) (Oral)   Resp 18   Ht 5\' 9"  (1.753 m)   Wt 76.2 kg   SpO2 96%   BMI 24.81 kg/m   Physical Exam  Constitutional: She appears well-developed and well-nourished. No distress.  HENT:  Head: Normocephalic and atraumatic.  Eyes: Conjunctivae are normal.  Neck: Neck supple.  Cardiovascular: Normal rate, regular rhythm, normal heart sounds and intact distal pulses.  Pulmonary/Chest: Effort normal and breath sounds normal. No respiratory distress.  Abdominal: Soft. There is tenderness. There is no guarding.  Patient has surgical wound across the lower abdomen.  There is erythema flanking the wound as well as some erythema that extends inferiorly.  There is some edema and tenderness throughout the region of the wound as well as tenderness in the lower abdomen.  No active drainage from the wound.  Musculoskeletal: She exhibits no edema.  Lymphadenopathy:    She has no cervical adenopathy.  Neurological: She is alert.  Skin: Skin is warm and dry. She is not diaphoretic. There is erythema.  Psychiatric: She has a normal mood and affect. Her behavior is normal.  Nursing note and vitals reviewed.          ED Treatments / Results  Labs (all labs ordered are listed, but only abnormal results are displayed) Labs Reviewed  BASIC METABOLIC PANEL - Abnormal; Notable for the following components:      Result Value   Glucose, Bld 100 (*)    All other components within normal limits  CBC WITH DIFFERENTIAL/PLATELET - Abnormal; Notable for the following components:   Hemoglobin 15.2 (*)    HCT 47.3 (*)    All other components within normal limits  URINALYSIS, ROUTINE W REFLEX MICROSCOPIC    EKG None  Radiology  Ct Abdomen Pelvis W Contrast  Result Date: 04/22/2018 CLINICAL DATA:  Abdominal infection. At liposuction in October. Open wounds which look infected. EXAM:  CT ABDOMEN AND PELVIS WITH CONTRAST TECHNIQUE: Multidetector CT imaging of the abdomen and pelvis was performed using the standard protocol following bolus administration of intravenous contrast. CONTRAST:  145mL ISOVUE-300 IOPAMIDOL (ISOVUE-300) INJECTION 61% COMPARISON:  None. FINDINGS: Lower chest: No acute abnormality. Hepatobiliary: No focal liver abnormality is seen. No gallstones, gallbladder wall thickening, or biliary dilatation. Pancreas: Unremarkable. No pancreatic ductal dilatation or surrounding inflammatory changes. Spleen: Normal in size without focal abnormality. Adrenals/Urinary Tract: Adrenal glands are unremarkable. Kidneys are normal, without renal calculi, focal lesion, or hydronephrosis. Bladder is unremarkable. Stomach/Bowel: Small hiatal hernia. Stomach is otherwise unremarkable. Moderate amount of stool in the ascending and transverse colon. Appendix appears normal. No evidence of bowel wall thickening, distention, or inflammatory changes. Vascular/Lymphatic: Abdominal aortic atherosclerosis. Normal caliber abdominal aorta. No lymphadenopathy. Reproductive: Status post hysterectomy. No adnexal masses. Other: No abdominopelvic ascites. Soft tissue wound of the anterior lower abdominal wall. 1.6 x 11.7 x 5.5 cm complex fluid collection with surrounding inflammatory changes in the subcutaneous fat of the anterior lower abdominal wall just above the pubic bone which may reflect a postoperative seroma versus abscess. Musculoskeletal: No acute osseous abnormality. No aggressive osseous lesion. Degenerative disc disease with disc height loss at L5-S1. Severe bilateral facet arthropathy at L5-S1. Moderate right foraminal stenosis at L5-S1. Minimal retrolisthesis of L5 on S1. IMPRESSION: 1. Soft tissue wound of the anterior lower abdominal wall. 1.6 x 11.7 x 5.5 cm complex fluid collection with surrounding inflammatory changes in the subcutaneous fat of the anterior lower abdominal wall just above  the pubic bone which may reflect a postoperative seroma versus abscess. 2.  Aortic Atherosclerosis (ICD10-I70.0). Electronically Signed   By: Kathreen Devoid   On: 04/22/2018 16:52    Procedures Procedures (including critical care time)  Medications Ordered in ED Medications  iopamidol (ISOVUE-300) 61 % injection (has no administration in time range)  oxyCODONE (Oxy IR/ROXICODONE) immediate release tablet 7.5 mg (7.5 mg Oral Given 04/22/18 1523)  iopamidol (ISOVUE-300) 61 % injection 100 mL (100 mLs Intravenous Contrast Given 04/22/18 1630)     Initial Impression / Assessment and Plan / ED Course  I have reviewed the triage vital signs and the nursing notes.  Pertinent labs & imaging results that were available during my care of the patient were reviewed by me and considered in my medical decision making (see chart for details).  Clinical Course as of Apr 22 1812  Wed Apr 22, 2018  1546 Spoke with [place]. States patient was prescribed Keflex BID 500mg  for 10 days starting 10/31, prescribed by the plastic surgeon. She was also prescribed 10 days of doxycycline the same day by her PCP with instructions to begin taking this medication if the Keflex was not improving symptoms.   [SJ]  [place] with Dr. Towanda Malkin, patient's surgeon. States, "I am always available for my patients and their concerns. I'm sad she didn't just call me because I could have gotten her right in." Advises to have patient call the office in the morning to be seen later in the day. He states since patient does not have leukocytosis, fever, or other systemic signs of infection, his suspicion for abscess is low.  No additional antibiotic therapy at this time.  No additional wound care instructions.   [SJ]    Clinical Course User Index [SJ] Joy, Shawn C, PA-C  Patient presents with concern for abdominal wound infection.  She does have erythema around the wound along with tenderness, but is nontoxic-appearing,  afebrile, not tachycardic, not tachypneic, and has no leukocytosis.  CT shows fluid collection with surrounding edema.  Given the above reassuring findings, fluid collection favors seroma.  She has been instructed to follow-up with her surgeon in the office tomorrow.  Strict return precautions discussed.  Patient voices understanding of these instructions, accepts the plan, and is comfortable with discharge.  Findings and plan of care discussed with Isla Pence, MD.   Vitals:   04/22/18 1226 04/22/18 1255 04/22/18 1810  BP: 137/84  (!) 107/94  Pulse: 92  84  Resp: 18  18  Temp: 98 F (36.7 C)    TempSrc: Oral    SpO2: 96%  96%  Weight:  76.2 kg   Height:  5\' 9"  (1.753 m)      Final Clinical Impressions(s) / ED Diagnoses   Final diagnoses:  Pain associated with wound    ED Discharge Orders    None       Layla Maw 04/22/18 1813    Isla Pence, MD 04/25/18 [phone]

## 2018-05-04 DIAGNOSIS — F419 Anxiety disorder, unspecified: Secondary | ICD-10-CM | POA: Diagnosis not present

## 2018-05-04 DIAGNOSIS — F319 Bipolar disorder, unspecified: Secondary | ICD-10-CM | POA: Diagnosis not present

## 2018-05-04 DIAGNOSIS — F4010 Social phobia, unspecified: Secondary | ICD-10-CM | POA: Diagnosis not present

## 2018-05-04 DIAGNOSIS — Y838 Other surgical procedures as the cause of abnormal reaction of the patient, or of later complication, without mention of misadventure at the time of the procedure: Secondary | ICD-10-CM | POA: Diagnosis not present

## 2018-05-04 DIAGNOSIS — T8132XA Disruption of internal operation (surgical) wound, not elsewhere classified, initial encounter: Secondary | ICD-10-CM | POA: Diagnosis not present

## 2018-05-04 DIAGNOSIS — F341 Dysthymic disorder: Secondary | ICD-10-CM | POA: Diagnosis not present

## 2018-05-04 DIAGNOSIS — F4310 Post-traumatic stress disorder, unspecified: Secondary | ICD-10-CM | POA: Diagnosis not present

## 2018-05-06 DIAGNOSIS — T8130XA Disruption of wound, unspecified, initial encounter: Secondary | ICD-10-CM | POA: Diagnosis not present

## 2018-05-06 DIAGNOSIS — S31103D Unspecified open wound of abdominal wall, right lower quadrant without penetration into peritoneal cavity, subsequent encounter: Secondary | ICD-10-CM | POA: Diagnosis not present

## 2018-05-10 DIAGNOSIS — J449 Chronic obstructive pulmonary disease, unspecified: Secondary | ICD-10-CM | POA: Diagnosis not present

## 2018-05-10 DIAGNOSIS — G4733 Obstructive sleep apnea (adult) (pediatric): Secondary | ICD-10-CM | POA: Diagnosis not present

## 2018-05-14 DIAGNOSIS — X58XXXA Exposure to other specified factors, initial encounter: Secondary | ICD-10-CM | POA: Diagnosis not present

## 2018-05-14 DIAGNOSIS — T8130XA Disruption of wound, unspecified, initial encounter: Secondary | ICD-10-CM | POA: Diagnosis not present

## 2018-05-14 DIAGNOSIS — Z9989 Dependence on other enabling machines and devices: Secondary | ICD-10-CM | POA: Diagnosis not present

## 2018-05-14 DIAGNOSIS — J449 Chronic obstructive pulmonary disease, unspecified: Secondary | ICD-10-CM | POA: Diagnosis not present

## 2018-05-14 DIAGNOSIS — G4733 Obstructive sleep apnea (adult) (pediatric): Secondary | ICD-10-CM | POA: Diagnosis not present

## 2018-05-15 DIAGNOSIS — S31102A Unspecified open wound of abdominal wall, epigastric region without penetration into peritoneal cavity, initial encounter: Secondary | ICD-10-CM | POA: Diagnosis not present

## 2018-05-18 ENCOUNTER — Ambulatory Visit (HOSPITAL_COMMUNITY): Payer: Self-pay

## 2018-05-19 DIAGNOSIS — J449 Chronic obstructive pulmonary disease, unspecified: Secondary | ICD-10-CM | POA: Diagnosis not present

## 2018-05-19 DIAGNOSIS — G4733 Obstructive sleep apnea (adult) (pediatric): Secondary | ICD-10-CM | POA: Diagnosis not present

## 2018-05-20 DIAGNOSIS — T8131XD Disruption of external operation (surgical) wound, not elsewhere classified, subsequent encounter: Secondary | ICD-10-CM | POA: Diagnosis not present

## 2018-05-20 DIAGNOSIS — S31109D Unspecified open wound of abdominal wall, unspecified quadrant without penetration into peritoneal cavity, subsequent encounter: Secondary | ICD-10-CM | POA: Diagnosis not present

## 2018-05-28 DIAGNOSIS — M5416 Radiculopathy, lumbar region: Secondary | ICD-10-CM | POA: Diagnosis not present

## 2018-05-28 DIAGNOSIS — E1142 Type 2 diabetes mellitus with diabetic polyneuropathy: Secondary | ICD-10-CM | POA: Diagnosis not present

## 2018-06-10 DIAGNOSIS — J449 Chronic obstructive pulmonary disease, unspecified: Secondary | ICD-10-CM | POA: Diagnosis not present

## 2018-06-11 DIAGNOSIS — R7302 Impaired glucose tolerance (oral): Secondary | ICD-10-CM | POA: Diagnosis not present

## 2018-06-11 DIAGNOSIS — R350 Frequency of micturition: Secondary | ICD-10-CM | POA: Diagnosis not present

## 2018-06-15 ENCOUNTER — Other Ambulatory Visit: Payer: Self-pay

## 2018-06-15 ENCOUNTER — Ambulatory Visit (HOSPITAL_COMMUNITY)
Admission: RE | Admit: 2018-06-15 | Discharge: 2018-06-15 | Disposition: A | Discharge status: Home / Self Care | Payer: BLUE CROSS/BLUE SHIELD | Source: Ambulatory Visit | Attending: Surgery | Admitting: Surgery

## 2018-06-15 ENCOUNTER — Ambulatory Visit: Payer: BLUE CROSS/BLUE SHIELD | Admitting: Surgery

## 2018-06-15 ENCOUNTER — Encounter: Payer: Self-pay | Admitting: Surgery

## 2018-06-15 VITALS — BP 129/75 | HR 97 | Temp 97.1°F | Resp 20 | Ht 69.0 in | Wt 170.0 lb

## 2018-06-15 DIAGNOSIS — I872 Venous insufficiency (chronic) (peripheral): Secondary | ICD-10-CM | POA: Diagnosis not present

## 2018-06-15 NOTE — Progress Notes (Signed)
[facility] [place]  Patient name: Cynthia Deleon MRN: [id number] DOB: [birth year] Sex: female   REQUESTING PROVIDER:   Dr. Marin Comment   REASON FOR CONSULT:    Venous disease  HISTORY OF PRESENT ILLNESS:   Cynthia Deleon is a 63 y.o. female, who is referred today for evaluation of leg swelling.  She states that she has had chronic leg swelling.  She is also getting cramps at night for which she has to get up and walk around to get them to go away.  She was just diagnosed as a diabetic today.  She does have neuropathy and no feeling in both of her legs.  She is a current smoker and has COPD.  She takes a statin for hypercholesterolemia.  She suffers from bipolar disorder.  PAST MEDICAL HISTORY    Past Medical History:  Diagnosis Date  . Arthritis    hands and knees  . Bipolar 1 disorder (HCC) Degenerative disk disease  . Borderline diabetic   . Chronic back pain   . Chronic neck pain   . Complication of anesthesia    high anxiety-does not want to be alone  . COPD (chronic obstructive pulmonary disease) ([place])   . Current use of estrogen therapy 01/12/2014  . Depression   . Headache   . Hot flashes 12/15/2013  . Hypertension   . IBS (irritable bowel syndrome)   . Incontinence   . Kidney stone   . 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)   . Panic attacks   . Peptic ulcer   . Peripheral neuropathy   . Rosacea   . Shingles   . Sleep apnea    uses a cpap-with oxygen     FAMILY HISTORY   Family History  Problem Relation Age of Onset  . Diabetes Mother   . Other Mother        vertigo; chronic eye disease  . COPD Father   . COPD Sister   . Alcohol abuse Brother   . Diabetes Maternal Grandmother   . Diabetes Maternal Grandfather   . Diabetes Sister   . Other Sister        hearing problems  . Hyperlipidemia Sister   . Alcohol abuse Brother   . COPD Brother   . Alcohol abuse Brother   . Alcohol abuse Brother   .  Other Brother        aneursym    SOCIAL HISTORY:   Social History   Socioeconomic History  . Marital status: Divorced    Spouse name: Not on file  . Number of children: 5  . Years of education: 9th  . Highest education level: Not on file  Occupational History  . Occupation: Disabled  Social Needs  . Financial resource strain: Not on file  . Food insecurity:    Worry: Not on file    Inability: Not on file  . Transportation needs:    Medical: Not on file    Non-medical: Not on file  Tobacco Use  . Smoking status: Current Some Day Smoker    Packs/day: 0.50    Years: 35.00    Pack years: 17.50    Types: Cigarettes    Last attempt to quit: 10/19/2012    Years since quitting: 5.6  . Smokeless tobacco: Never Used  Substance and Sexual Activity  . Alcohol use: No    Alcohol/week: 0.0 standard drinks  . Drug use: No  . Sexual activity: Never    Birth  control/protection: Surgical    Comment: occ cigarette  Lifestyle  . Physical activity:    Days per week: Not on file    Minutes per session: Not on file  . Stress: Not on file  Relationships  . Social connections:    Talks on phone: Not on file    Gets together: Not on file    Attends religious service: Not on file    Active member of club or organization: Not on file    Attends meetings of clubs or organizations: Not on file    Relationship status: Not on file  . Intimate partner violence:    Fear of current or ex partner: Not on file    Emotionally abused: Not on file    Physically abused: Not on file    Forced sexual activity: Not on file  Other Topics Concern  . Not on file  Social History Narrative   Lives at home with home with her son (has five children).   Right-handed.   1 cup caffeine per day.    ALLERGIES:    Allergies  Allergen Reactions  . Chantix [Varenicline] Other (See Comments)    Altered mental status-Per patient was put on allergy list by Dr Lorriane Shire bu patient staes she is not allergic  to this and is currently taking it.   . Divalproex Sodium Other (See Comments)    Hallucinations  Other reaction(s): Confusion  . Pseudoeph-Hydrocodone-Gg Nausea Only  . Sulfa Antibiotics Anaphylaxis  . Varenicline Tartrate     Other reaction(s): Confusion  . Ambien [Zolpidem] Other (See Comments)    Causes sleep walking  . Codeine Nausea And Vomiting  . Hydrocodone Nausea And Vomiting  . Paxil [Paroxetine Hcl] Other (See Comments)    Causes ringing in the ears.   . Sulfur Other (See Comments)    Swelling of tongue  . Amoxicillin Rash    Has patient had a PCN reaction causing immediate rash, facial/tongue/throat swelling, SOB or lightheadedness with hypotension: No Has patient had a PCN reaction causing severe rash involving mucus membranes or skin necrosis: No Has patient had a PCN reaction that required hospitalization: No Has patient had a PCN reaction occurring within the last 10 years: Yes If all of the above answers are "NO", then may proceed with Cephalosporin use.   . Tape Rash    CURRENT MEDICATIONS:    Current Outpatient Medications  Medication Sig Dispense Refill  . albuterol (PROVENTIL HFA;VENTOLIN HFA) 108 (90 Base) MCG/ACT inhaler Inhale 2 puffs into the lungs every 4 (four) hours as needed for wheezing or shortness of breath. 1 Inhaler 0  . diazepam (VALIUM) 5 MG tablet Take 1 tablet (5 mg total) by mouth every 8 (eight) hours as needed (vertigo). (Patient taking differently: Take 5 mg by mouth 3 (three) times daily. ) 10 tablet 0  . esomeprazole (NEXIUM) 40 MG capsule Take 40 mg by mouth 2 (two) times daily before a meal.     . estrogens, conjugated, (PREMARIN) 0.45 MG tablet Take 0.45 mg by mouth daily.     . fluticasone (FLONASE) 50 MCG/ACT nasal spray Place 1 spray into both nostrils daily as needed.  0  . ipratropium-albuterol (DUONEB) 0.5-2.5 (3) MG/3ML SOLN Inhale 3 mLs into the lungs 2 (two) times daily as needed for wheezing or shortness of breath.  4  .  lamoTRIgine (LAMICTAL) 100 MG tablet Take 100 mg by mouth daily.  0  . lamoTRIgine (LAMICTAL) 25 MG tablet Take 25 mg  by mouth daily.  0  . oxybutynin (DITROPAN) 5 MG tablet Take 5 mg by mouth 2 (two) times daily.  0  . oxyCODONE (ROXICODONE) 15 MG immediate release tablet Take 7.5 mg by mouth every 2 (two) hours.     . pravastatin (PRAVACHOL) 40 MG tablet Take 40 mg by mouth daily. Reported on 06/06/2015    . pregabalin (LYRICA) 200 MG capsule Take 200 mg by mouth 2 (two) times daily.  4  . promethazine (PHENERGAN) 25 MG tablet Take 25 mg by mouth every 8 (eight) hours as needed for nausea.  0  . umeclidinium-vilanterol (ANORO ELLIPTA) 62.5-25 MCG/INH AEPB Take by mouth.    . valACYclovir (VALTREX) 500 MG tablet Take 500 mg by mouth daily as needed (shingles flare up).   4  . vitamin B-12 1000 MCG tablet Take 1 tablet (1,000 mcg total) by mouth daily with breakfast. 30 tablet 0  . VRAYLAR 4.5 MG CAPS Take 4.5 mg by mouth daily.  0   No current facility-administered medications for this visit.     REVIEW OF SYSTEMS:   [X]  denotes positive finding, [ ]  denotes negative finding Cardiac  Comments:  Chest pain or chest pressure:    Shortness of breath upon exertion: x   Short of breath when lying flat: x   Irregular heart rhythm:        Vascular    Pain in calf, thigh, or hip brought on by ambulation: x   Pain in feet at night that wakes you up from your sleep:  x   Blood clot in your veins:    Leg swelling:  x       Pulmonary    Oxygen at home: x   Productive cough:     Wheezing:  x       Neurologic    Sudden weakness in arms or legs:     Sudden numbness in arms or legs:     Sudden onset of difficulty speaking or slurred speech:    Temporary loss of vision in one eye:     Problems with dizziness:         Gastrointestinal    Blood in stool:      Vomited blood:         Genitourinary    Burning when urinating:     Blood in urine:        Psychiatric    Major depression:          Hematologic    Bleeding problems:    Problems with blood clotting too easily:        Skin    Rashes or ulcers:        Constitutional    Fever or chills:     PHYSICAL EXAM:   Vitals:   06/15/18 1146  BP: 129/75  Pulse: 97  Resp: 20  Temp: (!) 97.1 F (36.2 C)  SpO2: 96%  Weight: 170 lb (77.1 kg)  Height: 5\' 9"  (1.753 m)    GENERAL: The patient is a well-nourished female, in no acute distress. The vital signs are documented above. CARDIAC: There is a regular rate and rhythm.  VASCULAR: Palpable pedal pulses.  Trace edema bilaterally PULMONARY: Nonlabored respirations ABDOMEN: Soft and non-tender with normal pitched bowel sounds.  MUSCULOSKELETAL: There are no major deformities or cyanosis. NEUROLOGIC: No sensation in either foot. SKIN: There are no ulcers or rashes noted. PSYCHIATRIC: The patient has a normal affect.  STUDIES:   I have  ordered and reviewed her vascular lab studies with the following findings: Vein Diameters: +------------------------------+----------+---------+                               Right (cm)Left (cm) +------------------------------+----------+---------+ GSV at Saphenofemoral junction0.691     0.616     +------------------------------+----------+---------+ GSV at prox thigh             0.478     0.392     +------------------------------+----------+---------+ GSV at mid thigh              0.504     0.456     +------------------------------+----------+---------+ GSV at distal thigh           0.393     0.425     +------------------------------+----------+---------+ GSV at knee                   0.44      0.324     +------------------------------+----------+---------+ GSV prox calf                 0.356     0.351     +------------------------------+----------+---------+ SSV origin                    0.233     0.274     +------------------------------+----------+---------+ SSV prox                       0.271     0.348     +------------------------------+----------+---------+ SSV mid                       0.297     0.261     +------------------------------+----------+---------+       Summary: Right: There is no evidence of deep vein thrombosis in the lower extremity.There is no evidence of superficial venous thrombosis.There is no evidence of chronic venous insufficiency. Left: There is no evidence of deep vein thrombosis in the lower extremity.There is no evidence of superficial venous thrombosis.There is no evidence of chronic venous insufficiency. ASSESSMENT and PLAN   Leg swelling: The patient's reflux evaluation today was normal.  This suggest lymphedema as the underlying cause.  I gave her information about elastic therapy and that she should start wearing compression stockings.  This should help with her nighttime cramps as well as the edema  Neuropathy: I believe this is secondary to her diabetes.  She has palpable pedal pulses and therefore this is not a arterial problem.  She will follow-up with me on an as-needed basis.   Annamarie Major, MD [facility] [phone] Pager [phone]

## 2018-06-17 DIAGNOSIS — Z09 Encounter for follow-up examination after completed treatment for conditions other than malignant neoplasm: Secondary | ICD-10-CM | POA: Diagnosis not present

## 2018-07-06 ENCOUNTER — Other Ambulatory Visit: Payer: Self-pay | Admitting: Chiropractic Medicine

## 2018-07-06 ENCOUNTER — Other Ambulatory Visit (HOSPITAL_COMMUNITY): Payer: Self-pay | Admitting: Chiropractic Medicine

## 2018-07-06 DIAGNOSIS — M5136 Other intervertebral disc degeneration, lumbar region: Secondary | ICD-10-CM | POA: Diagnosis not present

## 2018-07-06 DIAGNOSIS — M25562 Pain in left knee: Secondary | ICD-10-CM

## 2018-07-06 DIAGNOSIS — M5030 Other cervical disc degeneration, unspecified cervical region: Secondary | ICD-10-CM | POA: Diagnosis not present

## 2018-07-06 DIAGNOSIS — E11649 Type 2 diabetes mellitus with hypoglycemia without coma: Secondary | ICD-10-CM | POA: Insufficient documentation

## 2018-07-06 DIAGNOSIS — E1122 Type 2 diabetes mellitus with diabetic chronic kidney disease: Secondary | ICD-10-CM | POA: Insufficient documentation

## 2018-07-06 DIAGNOSIS — N183 Chronic kidney disease, stage 3 unspecified: Secondary | ICD-10-CM | POA: Insufficient documentation

## 2018-07-09 ENCOUNTER — Encounter: Payer: BLUE CROSS/BLUE SHIELD | Attending: Family Medicine | Admitting: Nutrition

## 2018-07-09 ENCOUNTER — Encounter: Payer: Self-pay | Admitting: Nutrition

## 2018-07-09 VITALS — Ht 69.0 in | Wt 175.0 lb

## 2018-07-09 DIAGNOSIS — IMO0002 Reserved for concepts with insufficient information to code with codable children: Secondary | ICD-10-CM

## 2018-07-09 DIAGNOSIS — E118 Type 2 diabetes mellitus with unspecified complications: Secondary | ICD-10-CM | POA: Diagnosis not present

## 2018-07-09 DIAGNOSIS — E1165 Type 2 diabetes mellitus with hyperglycemia: Secondary | ICD-10-CM | POA: Insufficient documentation

## 2018-07-09 DIAGNOSIS — K582 Mixed irritable bowel syndrome: Secondary | ICD-10-CM | POA: Diagnosis not present

## 2018-07-09 NOTE — Patient Instructions (Addendum)
Goals Eat three meals per day on time Increase fresh fruits and vegetables Keep drinking water Take Metformin after meals  Test blood sugars in am a few times a week and few times before bed

## 2018-07-09 NOTE — Progress Notes (Signed)
  Medical Nutrition Therapy:  Appt start time: 1100 end time:  1200.   Assessment:  Primary concerns today: DIabetes Type 2. Lives by herself. PMH Bipolar, IBS and  Hyperlipidemia.  Cooks meals at home. Just DX of DM recently. Metformin 500 mg BID. Wants to lose to 165 lbs. Active in her house. Wearing a right knee brace. Sees Dr. Curly Rim for PCP. Typically eats 1-2 meals per day. Limits some foods due to IBS. Willing to make changes with diet to improve blood sugars.   Preferred Learning Style:   No preference indicated   Learning Readiness:     Ready  Change in progress   MEDICATIONS:   DIETARY INTAKE:  24-hr recall:  Only eats 1-2 meals per day. Drinking only water   Usual physical activity: walks some  Estimated energy needs: 1600  calories 180 g carbohydrates 120 g protein 44 g fat  Progress Towards Goal(s):  In progress.   Nutritional Diagnosis:  NB-1.1 Food and nutrition-related knowledge deficit As related to Diabetes Type 2.  As evidenced by A1C 7.1%.    Intervention:  Nutrition and Diabetes education provided on My Plate, CHO counting, meal planning, portion sizes, timing of meals, avoiding snacks between meals unless having a low blood sugar, target ranges for A1C and blood sugars, signs/symptoms and treatment of hyper/hypoglycemia, monitoring blood sugars, taking medications as prescribed, benefits of exercising 30 minutes per day and prevention of complications of DM. Marland KitchenGoals Eat three meals per day on time Increase fresh fruits and vegetables Keep drinking water Take Metformin after meals  Test blood sugars in am a few times a week and few times before bed  Teaching Method Utilized:  Visual Auditory Hands on  Handouts given during visit include:  The Plate Method   Meal Plan Card   Barriers to learning/adherence to lifestyle change: none  Demonstrated degree of understanding via:  Teach Back   Monitoring/Evaluation:  Dietary intake,  exercise, meal planning, and body weight in 1 month(s).

## 2018-07-11 DIAGNOSIS — J449 Chronic obstructive pulmonary disease, unspecified: Secondary | ICD-10-CM | POA: Diagnosis not present

## 2018-07-14 ENCOUNTER — Ambulatory Visit (HOSPITAL_COMMUNITY)
Admission: RE | Admit: 2018-07-14 | Discharge: 2018-07-14 | Disposition: A | Discharge status: Home / Self Care | Payer: BLUE CROSS/BLUE SHIELD | Source: Ambulatory Visit | Attending: Chiropractic Medicine | Admitting: Chiropractic Medicine

## 2018-07-14 DIAGNOSIS — M25562 Pain in left knee: Secondary | ICD-10-CM | POA: Diagnosis not present

## 2018-07-15 DIAGNOSIS — K6289 Other specified diseases of anus and rectum: Secondary | ICD-10-CM | POA: Diagnosis not present

## 2018-07-15 DIAGNOSIS — R159 Full incontinence of feces: Secondary | ICD-10-CM | POA: Diagnosis not present

## 2018-07-22 DIAGNOSIS — D485 Neoplasm of uncertain behavior of skin: Secondary | ICD-10-CM | POA: Diagnosis not present

## 2018-07-22 DIAGNOSIS — E088 Diabetes mellitus due to underlying condition with unspecified complications: Secondary | ICD-10-CM | POA: Diagnosis not present

## 2018-07-23 DIAGNOSIS — M5030 Other cervical disc degeneration, unspecified cervical region: Secondary | ICD-10-CM | POA: Diagnosis not present

## 2018-07-23 DIAGNOSIS — M5136 Other intervertebral disc degeneration, lumbar region: Secondary | ICD-10-CM | POA: Diagnosis not present

## 2018-08-09 DIAGNOSIS — J449 Chronic obstructive pulmonary disease, unspecified: Secondary | ICD-10-CM | POA: Diagnosis not present

## 2018-08-10 ENCOUNTER — Ambulatory Visit (HOSPITAL_COMMUNITY): Payer: Self-pay

## 2018-08-13 ENCOUNTER — Ambulatory Visit (HOSPITAL_COMMUNITY): Payer: Self-pay

## 2018-08-14 DIAGNOSIS — G4733 Obstructive sleep apnea (adult) (pediatric): Secondary | ICD-10-CM | POA: Diagnosis not present

## 2018-08-19 ENCOUNTER — Other Ambulatory Visit: Payer: Self-pay

## 2018-08-19 ENCOUNTER — Ambulatory Visit (HOSPITAL_COMMUNITY)
Admission: RE | Admit: 2018-08-19 | Discharge: 2018-08-19 | Disposition: A | Discharge status: Home / Self Care | Payer: BLUE CROSS/BLUE SHIELD | Source: Ambulatory Visit | Attending: Family Medicine | Admitting: Family Medicine

## 2018-08-19 DIAGNOSIS — Z1231 Encounter for screening mammogram for malignant neoplasm of breast: Secondary | ICD-10-CM | POA: Diagnosis not present

## 2018-08-24 DIAGNOSIS — H2513 Age-related nuclear cataract, bilateral: Secondary | ICD-10-CM | POA: Diagnosis not present

## 2018-08-24 DIAGNOSIS — H16223 Keratoconjunctivitis sicca, not specified as Sjogren's, bilateral: Secondary | ICD-10-CM | POA: Diagnosis not present

## 2018-08-27 DIAGNOSIS — F310 Bipolar disorder, current episode hypomanic: Secondary | ICD-10-CM | POA: Diagnosis not present

## 2018-09-01 ENCOUNTER — Encounter: Payer: BLUE CROSS/BLUE SHIELD | Attending: Family Medicine | Admitting: Nutrition

## 2018-09-01 ENCOUNTER — Telehealth: Payer: Self-pay | Admitting: Nutrition

## 2018-09-01 ENCOUNTER — Other Ambulatory Visit: Payer: Self-pay

## 2018-09-01 ENCOUNTER — Encounter: Payer: Self-pay | Admitting: Nutrition

## 2018-09-01 DIAGNOSIS — E1165 Type 2 diabetes mellitus with hyperglycemia: Secondary | ICD-10-CM | POA: Insufficient documentation

## 2018-09-01 DIAGNOSIS — IMO0002 Reserved for concepts with insufficient information to code with codable children: Secondary | ICD-10-CM

## 2018-09-01 DIAGNOSIS — E118 Type 2 diabetes mellitus with unspecified complications: Secondary | ICD-10-CM | POA: Insufficient documentation

## 2018-09-01 NOTE — Progress Notes (Signed)
  Medical Nutrition Therapy:  Appt start time: 1100 end time:  1200.   Assessment:  Primary concerns today Phone contact follow up due to the Covid 19 virus.: : DIabetes Type 2. Lives by herself. PMH Bipolar, IBS and  Hyperlipidemia.  Just got a new meter Contour. Has trouble getting blood out of it.  Notes her BS 242 mg before lunch today. BS was 324 last night. Changed: drinking more water, trying to eating meals on times.  Limited access to grocery story. Currently eating , cereal, eggs and pinto beans.and limited food that she has. Doesn't have access to lower carb vegetables due to limited access to the grocery store. Doesn't get food stamps.  Cooks meals at home. Just DX of DM recently. Metformin 500 mg BID. Wants to lose to 165 lbs. Active in her house. Wearing a right knee brace. Sees Dr. Curly Rim for PCP.[name] Willing to make changes with diet to improve blood sugars. Doesn't have any contact with neighbors. Limited access to local food pantries.  Preferred Learning Style:   No preference indicated   Learning Readiness:     Ready  Change in progress   MEDICATIONS:   DIETARY INTAKE:   B)cherrios with milk, 1/2 banana, water L) 1 1/2 c Pintos and water D) Same as lunch. Water  Usual physical activity: walks some  Estimated energy needs: 1600  calories 180 g carbohydrates 120 g protein 44 g fat  Progress Towards Goal(s):  In progress.   Nutritional Diagnosis:  NB-1.1 Food and nutrition-related knowledge deficit As related to Diabetes Type 2.  As evidenced by A1C 7.1%.    Intervention:  Nutrition and Diabetes education provided on My Plate, CHO counting, meal planning, portion sizes, timing of meals, avoiding snacks between meals unless having a low blood sugar, target ranges for A1C and blood sugars, signs/symptoms and treatment of hyper/hypoglycemia, monitoring blood sugars, taking medications as prescribed, benefits of exercising 30 minutes per day and prevention  of complications of DM. Marland KitchenGoals Eat three meals per day on time Increase fresh fruits and vegetables Keep drinking water Take Metformin after meals  Test blood sugars in am a few times a week and few times before bed  Teaching Method Utilized:  Visual Auditory Hands on  Handouts given during visit include:  The Plate Method   Meal Plan Card   Barriers to learning/adherence to lifestyle change: none  Demonstrated degree of understanding via:  Teach Back   Monitoring/Evaluation:  Dietary intake, exercise, meal planning, and body weight in 1 month(s).  Recommend to consider adding Januvia to medications to help lower blood sugars. Increased Metformin may be discouraged due to IBS issues.

## 2018-09-01 NOTE — Telephone Encounter (Signed)
TC to PCP office to notify them of Pt's BS running 200-300's.

## 2018-09-01 NOTE — Patient Instructions (Addendum)
Goals 1. Continue to drink 5-6 bottles of water per day 2. Add lower carb vegetables with lunch and dinner when able. 3. Take meds as prescribed Test BS before breakfast and bedtime. Call provider if BS are over 300 3 times in a row.

## 2018-09-09 DIAGNOSIS — G4733 Obstructive sleep apnea (adult) (pediatric): Secondary | ICD-10-CM | POA: Diagnosis not present

## 2018-09-09 DIAGNOSIS — J449 Chronic obstructive pulmonary disease, unspecified: Secondary | ICD-10-CM | POA: Diagnosis not present

## 2018-09-10 DIAGNOSIS — G4733 Obstructive sleep apnea (adult) (pediatric): Secondary | ICD-10-CM | POA: Diagnosis not present

## 2018-09-10 DIAGNOSIS — J449 Chronic obstructive pulmonary disease, unspecified: Secondary | ICD-10-CM | POA: Diagnosis not present

## 2018-10-01 DIAGNOSIS — F310 Bipolar disorder, current episode hypomanic: Secondary | ICD-10-CM | POA: Diagnosis not present

## 2018-10-07 DIAGNOSIS — R52 Pain, unspecified: Secondary | ICD-10-CM | POA: Diagnosis not present

## 2018-10-09 DIAGNOSIS — J449 Chronic obstructive pulmonary disease, unspecified: Secondary | ICD-10-CM | POA: Diagnosis not present

## 2018-10-13 ENCOUNTER — Telehealth: Payer: Self-pay | Admitting: Nutrition

## 2018-10-13 ENCOUNTER — Other Ambulatory Visit: Payer: Self-pay

## 2018-10-13 ENCOUNTER — Encounter: Payer: BLUE CROSS/BLUE SHIELD | Attending: Family Medicine | Admitting: Nutrition

## 2018-10-13 VITALS — Wt 182.0 lb

## 2018-10-13 DIAGNOSIS — L03319 Cellulitis of trunk, unspecified: Secondary | ICD-10-CM | POA: Diagnosis not present

## 2018-10-13 DIAGNOSIS — E669 Obesity, unspecified: Secondary | ICD-10-CM

## 2018-10-13 DIAGNOSIS — E119 Type 2 diabetes mellitus without complications: Secondary | ICD-10-CM | POA: Diagnosis not present

## 2018-10-13 DIAGNOSIS — E118 Type 2 diabetes mellitus with unspecified complications: Secondary | ICD-10-CM | POA: Insufficient documentation

## 2018-10-13 DIAGNOSIS — E1165 Type 2 diabetes mellitus with hyperglycemia: Secondary | ICD-10-CM | POA: Insufficient documentation

## 2018-10-13 DIAGNOSIS — IMO0002 Reserved for concepts with insufficient information to code with codable children: Secondary | ICD-10-CM

## 2018-10-13 NOTE — Telephone Encounter (Signed)
No answer x 2. Left VM on cell # to return call for follow up visit.

## 2018-10-13 NOTE — Progress Notes (Signed)
  Medical Nutrition Therapy:  Appt start time 1500 end time: 1530    Assessment:  Primary concerns today Phone contact follow up due to the Covid 19 virus.: : DIabetes Type 2. Lives by herself. PMH Bipolar, IBS and  Hyperlipidemia.  Just went to MD for stomach issues. And has gotten antibiotic. . BS 120-200's.. 7 day avg      14 day avg 184  30 day avg  Just got a new meter Contour.  Has trouble getting blood out of it.  Notes her BS 242 mg before lunch today.  Changed: drinking more water, trying to eating meals on times.  Limited access to grocery story. Currently eating , cereal, eggs and pinto beans.and limited food that she has. Doesn't have access to lower carb vegetables due to limited access to the grocery store. Doesn't get food stamps.  Cooks meals at home. Just DX of DM recently. Metformin 500 mg BID. Wants to lose to 165 lbs. Active in her house. Wearing a right knee brace. Sees Dr. Curly Rim for PCP.[name] Willing to make changes with diet to improve blood sugars. Doesn't have any contact with neighbors. Limited access to local food pantries.  Preferred Learning Style:   No preference indicated   Learning Readiness:     Ready  Change in progress   MEDICATIONS:   DIETARY INTAKE:   B) nabs L)  Chicken nugget 7 , water D) Spinach salad with carrots, boiled egg, water Snack: ramen noodles.  Usual physical activity: walks some  Estimated energy needs: 1600  calories 180 g carbohydrates 120 g protein 44 g fat  Progress Towards Goal(s):  In progress.   Nutritional Diagnosis:  NB-1.1 Food and nutrition-related knowledge deficit As related to Diabetes Type 2.  As evidenced by A1C 7.1%.    Intervention:  Nutrition and Diabetes education provided on My Plate, CHO counting, meal planning, portion sizes, timing of meals, avoiding snacks between meals unless having a low blood sugar, target ranges for A1C and blood sugars, signs/symptoms and treatment of  hyper/hypoglycemia, monitoring blood sugars, taking medications as prescribed, benefits of exercising 30 minutes per day and prevention of complications of DM.  Goals Reach out to local food pantries fro food assistance. Try egg, toast and fruit for breakfast. Avoid ramen noodles Increase fresh fruits and vegetables with meals Drink only water. Test blood sugars in am and before bed. Try to eat meals on time.  Teaching Method Utilized:  Visual Auditory Hands on  Handouts given during visit include:  The Plate Method   Meal Plan Card   Barriers to learning/adherence to lifestyle change: none  Demonstrated degree of understanding via:  Teach Back   Monitoring/Evaluation:  Dietary intake, exercise, meal planning, and body weight in 1 month(s).  Recommend to consider adding Januvia to medications to help lower blood sugars. Increased Metformin may be discouraged due to IBS issues.

## 2018-10-13 NOTE — Telephone Encounter (Signed)
VM left to call back to complete phone f/u viist.

## 2018-10-14 ENCOUNTER — Telehealth: Payer: Self-pay | Admitting: Nutrition

## 2018-10-14 NOTE — Telephone Encounter (Signed)
Talked to Dr. Moises Blood nurse x 2 today about current treatment regimen for patient's blood sugars up and down and in the 200's per patient and treatment goals. Pt. Was seen yesterday for vaginal issues  By PCP and is on antibiotics now. PCP will check A1C in 3 months. Was recently changed from Metformin due to poor tolerance to Glipizide 10 mg daily.

## 2018-10-26 ENCOUNTER — Encounter: Payer: Self-pay | Admitting: Nutrition

## 2018-10-26 NOTE — Patient Instructions (Signed)
Goals Reach out to local food pantries fro food assistance. Try egg, toast and fruit for breakfast. Avoid ramen noodles Increase fresh fruits and vegetables with meals Drink only water. Test blood sugars in am and before bed. Try to eat meals on time.

## 2018-10-28 ENCOUNTER — Telehealth: Payer: Self-pay | Admitting: Nutrition

## 2018-10-28 NOTE — Telephone Encounter (Signed)
TC from   Just stated taking 10 mg of Glipizide and 5 mg of Glipizide in pm. Wanted to review BS readings. . FBS: 5//20  208 mg/dl  At 1;30 pm and 172 mg/dl  this am 5.19 238  Mg/dl at 7; 59 pm, 631 am 255  mg/dl,  5.18  192 at 530 am and  5.17 441 pm   301 mg/dl  651 am 172 mg/dl 5.16  556 am 210 mg/dl    515 1228    136 mg/dl 703 am 210 5.14 6526 pm 319 after eat dinner     541 am 160 5.13  627 pm 163,, 649 am 232  512 628 am 277 mg/dl and  5.11 8 pm 208 mg/dl  637 am was 179 5.10  7 46 pm  217 and 221 pm 256   11o9 am 240 644 am 19 5.9    807 am, 181 mg/dl   608 am 187 mg/dl 5.7 603 pm 185 mg/dl  5/6 645 pm 237 5.6 233 pm  180 mg/dl  IBS is givng her problems. Has taken some anti diarrhea meds to help her stomach.  Going to see stomach doctor on 27th. Dr. Towanda Malkin. Drinking only water.  Advised to add yogurt with her eggs in am and try to eat 3 balanced meals as she can with her IBS.  Will follow up in 1 month.    Eggs, fruit Lunch ham sandwich on wheat bread, water, watermelon 1 cup Dinner: tator tot casserole  3/4 c, water

## 2018-11-05 DIAGNOSIS — D485 Neoplasm of uncertain behavior of skin: Secondary | ICD-10-CM | POA: Diagnosis not present

## 2018-11-09 DIAGNOSIS — J449 Chronic obstructive pulmonary disease, unspecified: Secondary | ICD-10-CM | POA: Diagnosis not present

## 2018-11-10 ENCOUNTER — Telehealth: Payer: Self-pay | Admitting: Nutrition

## 2018-11-10 DIAGNOSIS — G4733 Obstructive sleep apnea (adult) (pediatric): Secondary | ICD-10-CM | POA: Diagnosis not present

## 2018-11-10 DIAGNOSIS — F310 Bipolar disorder, current episode hypomanic: Secondary | ICD-10-CM | POA: Diagnosis not present

## 2018-11-10 NOTE — Telephone Encounter (Signed)
TC BS 6/2  am 162,   6/1 421 pm   245 mg/dl     555 am  182 mg/dl 5/31 558 pm 181 mg/dl   5/31 633 am 159 mg/dl 5/30   417 pm. 187(2 hours)  622 am 140 mg/d; BS are better now that she is on Gliizide 10 mg BID Taking 10 mg of Glipizide BID.  Wt is 182 lbs. Has been eating more fruit. Vegetables, and drinking more water. Advised to be sure to eat three meals per day and don't skip meals. Walk 30 minutes a day.  Reviewed meter settings and how to retreive BS readings. Contour meter.  Advised to call if any questions.

## 2018-12-01 ENCOUNTER — Ambulatory Visit: Payer: BLUE CROSS/BLUE SHIELD | Admitting: Nutrition

## 2018-12-01 NOTE — Telephone Encounter (Signed)
Vm left to call back.  ?

## 2018-12-02 ENCOUNTER — Ambulatory Visit: Payer: BLUE CROSS/BLUE SHIELD | Admitting: Nutrition

## 2018-12-03 DIAGNOSIS — Z1159 Encounter for screening for other viral diseases: Secondary | ICD-10-CM | POA: Diagnosis not present

## 2018-12-09 DIAGNOSIS — J449 Chronic obstructive pulmonary disease, unspecified: Secondary | ICD-10-CM | POA: Diagnosis not present

## 2018-12-15 DIAGNOSIS — G4733 Obstructive sleep apnea (adult) (pediatric): Secondary | ICD-10-CM | POA: Diagnosis not present

## 2018-12-15 DIAGNOSIS — R300 Dysuria: Secondary | ICD-10-CM | POA: Diagnosis not present

## 2018-12-15 DIAGNOSIS — E119 Type 2 diabetes mellitus without complications: Secondary | ICD-10-CM | POA: Diagnosis not present

## 2018-12-22 DIAGNOSIS — M5030 Other cervical disc degeneration, unspecified cervical region: Secondary | ICD-10-CM | POA: Diagnosis not present

## 2018-12-22 DIAGNOSIS — M5136 Other intervertebral disc degeneration, lumbar region: Secondary | ICD-10-CM | POA: Diagnosis not present

## 2018-12-23 DIAGNOSIS — M48061 Spinal stenosis, lumbar region without neurogenic claudication: Secondary | ICD-10-CM | POA: Diagnosis not present

## 2018-12-24 ENCOUNTER — Other Ambulatory Visit: Payer: Self-pay | Admitting: Chiropractic Medicine

## 2018-12-24 DIAGNOSIS — M48061 Spinal stenosis, lumbar region without neurogenic claudication: Secondary | ICD-10-CM

## 2019-01-01 ENCOUNTER — Ambulatory Visit (HOSPITAL_COMMUNITY)
Admission: RE | Admit: 2019-01-01 | Discharge: 2019-01-01 | Disposition: A | Discharge status: Home / Self Care | Payer: BC Managed Care – PPO | Source: Ambulatory Visit | Attending: Chiropractic Medicine | Admitting: Chiropractic Medicine

## 2019-01-01 ENCOUNTER — Other Ambulatory Visit: Payer: Self-pay

## 2019-01-01 DIAGNOSIS — M48061 Spinal stenosis, lumbar region without neurogenic claudication: Secondary | ICD-10-CM | POA: Diagnosis not present

## 2019-01-01 DIAGNOSIS — M545 Low back pain: Secondary | ICD-10-CM | POA: Diagnosis not present

## 2019-01-05 DIAGNOSIS — F310 Bipolar disorder, current episode hypomanic: Secondary | ICD-10-CM | POA: Diagnosis not present

## 2019-01-09 DIAGNOSIS — J449 Chronic obstructive pulmonary disease, unspecified: Secondary | ICD-10-CM | POA: Diagnosis not present

## 2019-01-19 ENCOUNTER — Encounter: Payer: BC Managed Care – PPO | Attending: Family Medicine | Admitting: Nutrition

## 2019-01-19 ENCOUNTER — Other Ambulatory Visit: Payer: Self-pay

## 2019-01-19 NOTE — Progress Notes (Signed)
  Medical Nutrition Therapy:  Appt start time: 1130 end time:  1200.  Assessment:  Primary concerns today Phone contact follow up due to the Covid 19 virus.: : DIabetes Type 2. Lives by herself. PMH Bipolar, IBS and  Hyperlipidemia.  She notes she is doing better and making some progress. Still working on eating meals on time and getting access to healthier foods. Nees to get A1C done.  CMP Latest Ref Rng & Units 04/22/2018 11/17/2017 11/20/2016  Glucose 70 - 99 mg/dL 100(H) 120(H) 146(H)  BUN 8 - 23 mg/dL 15 13 11   Creatinine 0.44 - 1.00 mg/dL 0.84 0.75 1.01(H)  Sodium 135 - 145 mmol/L 139 143 140  Potassium 3.5 - 5.1 mmol/L 3.9 4.2 3.9  Chloride 98 - 111 mmol/L 103 104 104  CO2 22 - 32 mmol/L 28 33(H) 25  Calcium 8.9 - 10.3 mg/dL 9.0 9.1 9.2  Total Protein 6.5 - 8.1 g/dL - - 7.0  Total Bilirubin 0.3 - 1.2 mg/dL - - 0.6  Alkaline Phos 38 - 126 U/L - - 56  AST 15 - 41 U/L - - 19  ALT 14 - 54 U/L - - 12(L)     Preferred Learning Style:   No preference indicated   Learning Readiness:     Ready  Change in progress   MEDICATIONS:   DIETARY INTAKE:   B) nabs L)  Chicken nugget 7 , water D) Spinach salad with carrots, boiled egg, water Snack: ramen noodles.  Usual physical activity: walks some  Estimated energy needs: 1600  calories 180 g carbohydrates 120 g protein 44 g fat  Progress Towards Goal(s):  In progress.   Nutritional Diagnosis:  NB-1.1 Food and nutrition-related knowledge deficit As related to Diabetes Type 2.  As evidenced by A1C 7.1%.    Intervention:  Nutrition and Diabetes education provided on My Plate, CHO counting, meal planning, portion sizes, timing of meals, avoiding snacks between meals unless having a low blood sugar, target ranges for A1C and blood sugars, signs/symptoms and treatment of hyper/hypoglycemia, monitoring blood sugars, taking medications as prescribed, benefits of exercising 30 minutes per day and prevention of complications of  DM.  Goals Try eggs and toast for breakfast  Eat soup and sandwich or sandwich and vegetables for lunch. Try frozen veggies from [employer].  Walk as you can. Lose 1 lb per week Get A1C to 7% or lower.   Teaching Method Utilized:  Visual Auditory Hands on  Handouts given during visit include:  The Plate Method   Meal Plan Card   Barriers to learning/adherence to lifestyle change: none  Demonstrated degree of understanding via:  Teach Back   Monitoring/Evaluation:  Dietary intake, exercise, meal planning, and body weight in 1 month(s).

## 2019-01-21 DIAGNOSIS — M5137 Other intervertebral disc degeneration, lumbosacral region: Secondary | ICD-10-CM | POA: Diagnosis not present

## 2019-01-22 ENCOUNTER — Encounter: Payer: Self-pay | Admitting: Plastic Surgery

## 2019-01-22 ENCOUNTER — Other Ambulatory Visit: Payer: Self-pay

## 2019-01-22 ENCOUNTER — Ambulatory Visit (INDEPENDENT_AMBULATORY_CARE_PROVIDER_SITE_OTHER): Payer: BC Managed Care – PPO | Admitting: Plastic Surgery

## 2019-01-22 VITALS — BP 166/84 | HR 82 | Temp 97.8°F | Ht 69.0 in | Wt 184.0 lb

## 2019-01-22 DIAGNOSIS — F4310 Post-traumatic stress disorder, unspecified: Secondary | ICD-10-CM | POA: Diagnosis not present

## 2019-01-22 DIAGNOSIS — G6289 Other specified polyneuropathies: Secondary | ICD-10-CM

## 2019-01-22 DIAGNOSIS — E881 Lipodystrophy, not elsewhere classified: Secondary | ICD-10-CM

## 2019-01-22 NOTE — Progress Notes (Signed)
[name], female    DOB: [birth year], 63 y.o.   MRN: [id number]   Chief Complaint  Patient presents with  . Skin Problem    The patient is a 63 year old white female here for evaluation of her lower abdominal area and pubic area.  The patient states she underwent an abdominoplasty in 1995.  Since then she noted fullness in the pubic area.  She then had this operated on by Dr. Towanda Malkin in October 2019.  A few days after the operation the incision opened and she was seen at the [facility] emergency room.  She gives a detailed description of the occurrences around this episode for which she ended up at [facility].  She is 5 feet 9 inches tall.  She weighs 184 pounds.  Patient states that she is not currently smoking but has a tobacco history.  She does have some second hand tobacco exposure.  She has now healed the area, pictures are in the chart.  She is concerned about the fullness in the pubic area and is under the impression that this is a serious medical problem.  Past surgical history is positive for a blepharoplasy, rhinoplasty and hysterectomy.  The pubic area is full and she has some scarring at the juncture between the pubic area and the abdominal scar.  She also complains of some numbness in this area.   Review of Systems  Constitutional: Negative for activity change and appetite change.  HENT: Negative for congestion.   Eyes: Negative.   Respiratory: Negative for chest tightness and shortness of breath.   Cardiovascular: Negative for leg swelling.  Gastrointestinal: Positive for abdominal pain. Negative for diarrhea.  Endocrine: Negative.   Genitourinary: Negative.   Musculoskeletal: Positive for arthralgias.  Hematological: Negative.   Psychiatric/Behavioral: Negative.     Past Medical History:  Diagnosis Date  . Arthritis    hands and knees  . Bipolar 1 disorder (HCC) Degenerative disk disease  . Borderline diabetic   . Chronic back pain   .  Chronic neck pain   . Complication of anesthesia    high anxiety-does not want to be alone  . COPD (chronic obstructive pulmonary disease) ([place])   . Current use of estrogen therapy 01/12/2014  . Depression   . Headache   . Hot flashes 12/15/2013  . Hypertension   . IBS (irritable bowel syndrome)   . Incontinence   . Kidney stone   . 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 Panic attacks   . Peptic ulcer   . Peripheral neuropathy   . Rosacea   . Shingles   . Sleep apnea    uses a cpap-with oxygen    Past Surgical History:  Procedure Laterality Date  . ABDOMINAL HYSTERECTOMY    . BUNIONECTOMY WITH HAMMERTOE RECONSTRUCTION Right 12/10/2012   Procedure: RIGHT FIRST METATARSAL CHEVRON BUNION CORRECTION,  2 AND 3 HAMMERTOE CORRECTION , RIGHT 3 AND 4 TOE NAIL EXCISION ;  Surgeon: Wylene Simmer, MD;  Location: [facility];  Service: Orthopedics;  Laterality: Right;  . FOOT ARTHRODESIS  2000   both feet  . RECTAL SURGERY    . TONSILLECTOMY        Current Outpatient Medications:  .  albuterol (PROVENTIL HFA;VENTOLIN HFA) 108 (90 Base) MCG/ACT inhaler, Inhale 2 puffs into the lungs every 4 (four) hours as needed for wheezing or shortness of breath., Disp: 1 Inhaler, Rfl: 0 .  diazepam (VALIUM) 5 MG tablet, Take 1  tablet (5 mg total) by mouth every 8 (eight) hours as needed (vertigo). (Patient taking differently: Take 5 mg by mouth 3 (three) times daily. ), Disp: 10 tablet, Rfl: 0 .  esomeprazole (NEXIUM) 40 MG capsule, Take 40 mg by mouth 2 (two) times daily before a meal. , Disp: , Rfl:  .  estrogens, conjugated, (PREMARIN) 0.45 MG tablet, Take 0.45 mg by mouth daily. , Disp: , Rfl:  .  fluticasone (FLONASE) 50 MCG/ACT nasal spray, Place 1 spray into both nostrils daily as needed., Disp: , Rfl: 0 .  glipiZIDE (GLUCOTROL) 5 MG tablet, Take 5 mg by mouth 3 (three) times daily before meals. Takes 10 mg before breakfast and 5 mg before supper., Disp: , Rfl:  .  ipratropium-albuterol  (DUONEB) 0.5-2.5 (3) MG/3ML SOLN, Inhale 3 mLs into the lungs 2 (two) times daily as needed for wheezing or shortness of breath., Disp: , Rfl: 4 .  lamoTRIgine (LAMICTAL) 100 MG tablet, Take 100 mg by mouth daily., Disp: , Rfl: 0 .  lamoTRIgine (LAMICTAL) 25 MG tablet, Take 25 mg by mouth daily., Disp: , Rfl: 0 .  metFORMIN (GLUCOPHAGE) 500 MG tablet, Take 500 mg by mouth 2 (two) times daily with a meal., Disp: , Rfl:  .  oxybutynin (DITROPAN) 5 MG tablet, Take 5 mg by mouth 2 (two) times daily., Disp: , Rfl: 0 .  oxyCODONE (ROXICODONE) 15 MG immediate release tablet, Take 7.5 mg by mouth every 2 (two) hours. , Disp: , Rfl:  .  pravastatin (PRAVACHOL) 40 MG tablet, Take 40 mg by mouth daily. Reported on 06/06/2015, Disp: , Rfl:  .  pregabalin (LYRICA) 200 MG capsule, Take 200 mg by mouth 2 (two) times daily., Disp: , Rfl: 4 .  promethazine (PHENERGAN) 25 MG tablet, Take 25 mg by mouth every 8 (eight) hours as needed for nausea., Disp: , Rfl: 0 .  umeclidinium-vilanterol (ANORO ELLIPTA) 62.5-25 MCG/INH AEPB, Take by mouth., Disp: , Rfl:  .  valACYclovir (VALTREX) 500 MG tablet, Take 500 mg by mouth daily as needed (shingles flare up). , Disp: , Rfl: 4 .  vitamin B-12 1000 MCG tablet, Take 1 tablet (1,000 mcg total) by mouth daily with breakfast., Disp: 30 tablet, Rfl: 0 .  VRAYLAR 4.5 MG CAPS, Take 4.5 mg by mouth daily., Disp: , Rfl: 0   Objective:   Vitals:   01/22/19 1010  BP: (!) 166/84  Pulse: 82  Temp: 97.8 F (36.6 C)  SpO2: 96%    Physical Exam Vitals signs and nursing note reviewed.  Constitutional:      Appearance: Normal appearance.  HENT:     Head: Normocephalic and atraumatic.  Eyes:     Extraocular Movements: Extraocular movements intact.  Cardiovascular:     Rate and Rhythm: Normal rate.  Pulmonary:     Effort: Pulmonary effort is normal. No respiratory distress.  Abdominal:     General: Abdomen is flat. There is no distension.     Tenderness: There is no  abdominal tenderness.  Skin:    General: Skin is warm.  Neurological:     General: No focal deficit present.     Mental Status: She is alert and oriented to person, place, and time.  Psychiatric:        Mood and Affect: Mood normal.        Behavior: Behavior normal.        Thought Content: Thought content normal.     Assessment & Plan:  ICD-10-CM   1. PTSD (post-traumatic stress disorder)  F43.10   2. 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  She was treated at [facility].  I suggest that she seek further advice from them on this situation since they helped her the previous time. A call was placed to her primary care physician and I shared the above information with them. Pictures were obtained of the patient and placed in the chart with the patient's or guardian's permission.  [place], DO

## 2019-02-03 DIAGNOSIS — M238X2 Other internal derangements of left knee: Secondary | ICD-10-CM | POA: Diagnosis not present

## 2019-02-03 DIAGNOSIS — M25562 Pain in left knee: Secondary | ICD-10-CM | POA: Diagnosis not present

## 2019-02-03 DIAGNOSIS — M1712 Unilateral primary osteoarthritis, left knee: Secondary | ICD-10-CM | POA: Diagnosis not present

## 2019-02-04 ENCOUNTER — Other Ambulatory Visit: Payer: Self-pay | Admitting: Orthopedic Surgery

## 2019-02-04 ENCOUNTER — Other Ambulatory Visit (HOSPITAL_COMMUNITY): Payer: Self-pay | Admitting: Orthopedic Surgery

## 2019-02-04 DIAGNOSIS — M25562 Pain in left knee: Secondary | ICD-10-CM

## 2019-02-09 DIAGNOSIS — J449 Chronic obstructive pulmonary disease, unspecified: Secondary | ICD-10-CM | POA: Diagnosis not present

## 2019-02-12 ENCOUNTER — Ambulatory Visit (HOSPITAL_COMMUNITY): Admission: RE | Admit: 2019-02-12 | Payer: BC Managed Care – PPO | Source: Ambulatory Visit

## 2019-02-12 ENCOUNTER — Encounter (HOSPITAL_COMMUNITY): Payer: Self-pay

## 2019-02-17 ENCOUNTER — Encounter: Payer: Self-pay | Admitting: Nutrition

## 2019-02-17 NOTE — Patient Instructions (Signed)
Goals Try eggs and toast for breakfast  Eat soup and sandwich or sandwich and vegetables for lunch. Try frozen veggies from [employer].  Walk as you can. Lose 1 lb per week Get A1C to 7% or lower.

## 2019-02-23 DIAGNOSIS — R300 Dysuria: Secondary | ICD-10-CM | POA: Diagnosis not present

## 2019-02-27 DIAGNOSIS — M5030 Other cervical disc degeneration, unspecified cervical region: Secondary | ICD-10-CM | POA: Diagnosis not present

## 2019-02-27 DIAGNOSIS — M5136 Other intervertebral disc degeneration, lumbar region: Secondary | ICD-10-CM | POA: Diagnosis not present

## 2019-03-01 DIAGNOSIS — J449 Chronic obstructive pulmonary disease, unspecified: Secondary | ICD-10-CM | POA: Diagnosis not present

## 2019-03-02 DIAGNOSIS — R079 Chest pain, unspecified: Secondary | ICD-10-CM | POA: Diagnosis not present

## 2019-03-02 DIAGNOSIS — E119 Type 2 diabetes mellitus without complications: Secondary | ICD-10-CM | POA: Diagnosis not present

## 2019-03-02 DIAGNOSIS — E782 Mixed hyperlipidemia: Secondary | ICD-10-CM | POA: Diagnosis not present

## 2019-03-02 DIAGNOSIS — K219 Gastro-esophageal reflux disease without esophagitis: Secondary | ICD-10-CM | POA: Diagnosis not present

## 2019-03-02 DIAGNOSIS — K2970 Gastritis, unspecified, without bleeding: Secondary | ICD-10-CM | POA: Diagnosis not present

## 2019-03-03 DIAGNOSIS — K219 Gastro-esophageal reflux disease without esophagitis: Secondary | ICD-10-CM | POA: Diagnosis not present

## 2019-03-11 DIAGNOSIS — J449 Chronic obstructive pulmonary disease, unspecified: Secondary | ICD-10-CM | POA: Diagnosis not present

## 2019-03-15 DIAGNOSIS — Z0000 Encounter for general adult medical examination without abnormal findings: Secondary | ICD-10-CM | POA: Diagnosis not present

## 2019-03-15 DIAGNOSIS — F319 Bipolar disorder, unspecified: Secondary | ICD-10-CM | POA: Diagnosis not present

## 2019-03-15 DIAGNOSIS — K219 Gastro-esophageal reflux disease without esophagitis: Secondary | ICD-10-CM | POA: Diagnosis not present

## 2019-03-15 DIAGNOSIS — J449 Chronic obstructive pulmonary disease, unspecified: Secondary | ICD-10-CM | POA: Diagnosis not present

## 2019-03-26 DIAGNOSIS — L905 Scar conditions and fibrosis of skin: Secondary | ICD-10-CM | POA: Diagnosis not present

## 2019-03-30 ENCOUNTER — Other Ambulatory Visit (HOSPITAL_COMMUNITY): Payer: Self-pay | Admitting: Respiratory Therapy

## 2019-03-30 DIAGNOSIS — M545 Low back pain: Secondary | ICD-10-CM | POA: Diagnosis not present

## 2019-03-30 DIAGNOSIS — J9611 Chronic respiratory failure with hypoxia: Secondary | ICD-10-CM | POA: Diagnosis not present

## 2019-03-30 DIAGNOSIS — J441 Chronic obstructive pulmonary disease with (acute) exacerbation: Secondary | ICD-10-CM

## 2019-03-30 DIAGNOSIS — G4733 Obstructive sleep apnea (adult) (pediatric): Secondary | ICD-10-CM | POA: Diagnosis not present

## 2019-03-30 DIAGNOSIS — J449 Chronic obstructive pulmonary disease, unspecified: Secondary | ICD-10-CM | POA: Diagnosis not present

## 2019-04-01 DIAGNOSIS — M1712 Unilateral primary osteoarthritis, left knee: Secondary | ICD-10-CM | POA: Insufficient documentation

## 2019-04-05 ENCOUNTER — Encounter (HOSPITAL_COMMUNITY): Payer: Self-pay | Admitting: *Deleted

## 2019-04-05 DIAGNOSIS — F310 Bipolar disorder, current episode hypomanic: Secondary | ICD-10-CM | POA: Diagnosis not present

## 2019-04-05 NOTE — Progress Notes (Signed)
We received referral from Dr. Luan Pulling office for lung cancer screening program.  I attempted to reach Cynthia Deleon today via telephone to discuss program and get her scheduled.  I left a VM for her to return my call at her earliest convenience.

## 2019-04-06 ENCOUNTER — Encounter (HOSPITAL_COMMUNITY): Payer: Self-pay | Admitting: *Deleted

## 2019-04-06 ENCOUNTER — Other Ambulatory Visit (HOSPITAL_COMMUNITY): Payer: Self-pay | Admitting: *Deleted

## 2019-04-06 DIAGNOSIS — Z87891 Personal history of nicotine dependence: Secondary | ICD-10-CM

## 2019-04-06 DIAGNOSIS — Z122 Encounter for screening for malignant neoplasm of respiratory organs: Secondary | ICD-10-CM

## 2019-04-06 NOTE — Progress Notes (Signed)
Lung cancer screening orders placed for CT

## 2019-04-06 NOTE — Progress Notes (Signed)
Received referral for initial lung cancer screening scan by Dr. Sinda Du.  Contacted patient and obtained smoking history (started age 63, current smoker and exposed to heavy second hand smoke, 45 pack year) as well as answering questions related to the screening process.  Patient denies signs/symptoms of lung cancer such as weight loss or hemoptysis.  Patient denies comorbidity that would prevent curative treatment if lung cancer were to be found.  Patient is scheduled for shared decision making visit and CT scan on 11/10 at 0950.

## 2019-04-08 DIAGNOSIS — R1030 Lower abdominal pain, unspecified: Secondary | ICD-10-CM | POA: Diagnosis not present

## 2019-04-08 DIAGNOSIS — K219 Gastro-esophageal reflux disease without esophagitis: Secondary | ICD-10-CM | POA: Diagnosis not present

## 2019-04-11 DIAGNOSIS — J449 Chronic obstructive pulmonary disease, unspecified: Secondary | ICD-10-CM | POA: Diagnosis not present

## 2019-04-20 ENCOUNTER — Telehealth (HOSPITAL_COMMUNITY): Payer: Self-pay | Admitting: *Deleted

## 2019-04-20 ENCOUNTER — Ambulatory Visit (HOSPITAL_COMMUNITY)
Admission: RE | Admit: 2019-04-20 | Discharge: 2019-04-20 | Disposition: A | Discharge status: Home / Self Care | Payer: BC Managed Care – PPO | Source: Ambulatory Visit | Attending: Hematology | Admitting: Hematology

## 2019-04-20 ENCOUNTER — Other Ambulatory Visit: Payer: Self-pay

## 2019-04-20 ENCOUNTER — Encounter (HOSPITAL_COMMUNITY): Payer: Self-pay | Admitting: *Deleted

## 2019-04-20 ENCOUNTER — Inpatient Hospital Stay (HOSPITAL_COMMUNITY): Payer: BC Managed Care – PPO | Attending: Hematology | Admitting: Hematology

## 2019-04-20 ENCOUNTER — Other Ambulatory Visit: Payer: Self-pay | Admitting: Gastroenterology

## 2019-04-20 ENCOUNTER — Other Ambulatory Visit (HOSPITAL_COMMUNITY)
Admission: RE | Admit: 2019-04-20 | Discharge: 2019-04-20 | Disposition: A | Discharge status: Home / Self Care | Payer: BC Managed Care – PPO | Source: Ambulatory Visit | Attending: Pulmonary Disease | Admitting: Pulmonary Disease

## 2019-04-20 ENCOUNTER — Other Ambulatory Visit (HOSPITAL_COMMUNITY): Payer: Self-pay | Admitting: Gastroenterology

## 2019-04-20 DIAGNOSIS — F17200 Nicotine dependence, unspecified, uncomplicated: Secondary | ICD-10-CM

## 2019-04-20 DIAGNOSIS — Z122 Encounter for screening for malignant neoplasm of respiratory organs: Secondary | ICD-10-CM

## 2019-04-20 DIAGNOSIS — F17210 Nicotine dependence, cigarettes, uncomplicated: Secondary | ICD-10-CM | POA: Diagnosis not present

## 2019-04-20 DIAGNOSIS — Z20828 Contact with and (suspected) exposure to other viral communicable diseases: Secondary | ICD-10-CM | POA: Diagnosis not present

## 2019-04-20 DIAGNOSIS — R1030 Lower abdominal pain, unspecified: Secondary | ICD-10-CM

## 2019-04-20 DIAGNOSIS — Z87891 Personal history of nicotine dependence: Secondary | ICD-10-CM

## 2019-04-20 DIAGNOSIS — Z720 Tobacco use: Secondary | ICD-10-CM | POA: Insufficient documentation

## 2019-04-20 LAB — SARS CORONAVIRUS 2 (TAT 6-24 HRS): SARS Coronavirus 2: NEGATIVE

## 2019-04-20 NOTE — Progress Notes (Signed)
[facility] Cancer Initial Visit:  Patient Care Team: Le, Thao P, DO as PCP - General (Family Medicine) Ramos, Richard, MD as Consulting Physician (Physical Medicine and Rehabilitation) Ramos, Richard, MD as Consulting Physician (Physical Medicine and Rehabilitation)  CHIEF COMPLAINTS/PURPOSE OF CONSULTATION: Shared Decision-Making Visit for Lung Cancer Screening   HISTORY OF PRESENTING ILLNESS: Cynthia Deleon 63 y.o. female presents today for shared decision making regarding lung cancer screening.  Patient states she started smoking cigarettes at age 16.  She smokes 1 pack of cigarettes a day for the last 47 years.  She does admit to a chronic cough with no sputum production.  Denies hemoptysis.  Denies any shortness of breath or chest pain.  Denies any change in bowel habits.  Appetite is stable.  No weight loss.  She is unsure of her family history.  She does follow-up with her primary care provider regularly.  Review of Systems  Constitutional: Positive for fatigue.  HENT:  Negative.   Eyes: Negative.   Respiratory: Positive for cough.   Cardiovascular: Negative.   Gastrointestinal: Negative.   Endocrine: Negative.   Genitourinary: Negative.    Musculoskeletal: Positive for arthralgias and myalgias.  Skin: Negative.   Neurological: Negative.   Hematological: Negative.   Psychiatric/Behavioral: Negative.     MEDICAL HISTORY: Past Medical History:  Diagnosis Date  . Arthritis    hands and knees  . Bipolar 1 disorder (HCC) Degenerative disk disease  . Borderline diabetic   . Chronic back pain   . Chronic neck pain   . Complication of anesthesia    high anxiety-does not want to be alone  . COPD (chronic obstructive pulmonary disease) (HCC)   . Current use of estrogen therapy 01/12/2014  . Depression   . Headache   . Hot flashes 12/15/2013  . Hypertension   . IBS (irritable bowel syndrome)   . Incontinence   . Kidney stone   . Multiple personality  disorder (HCC)   . Panic attacks   . Peptic ulcer   . Peripheral neuropathy   . Rosacea   . Shingles   . Sleep apnea    uses a cpap-with oxygen    SURGICAL HISTORY: Past Surgical History:  Procedure Laterality Date  . ABDOMINAL HYSTERECTOMY    . BUNIONECTOMY WITH HAMMERTOE RECONSTRUCTION Right 12/10/2012   Procedure: RIGHT FIRST METATARSAL CHEVRON BUNION CORRECTION,  2 AND 3 HAMMERTOE CORRECTION , RIGHT 3 AND 4 TOE NAIL EXCISION ;  Surgeon: John Hewitt, MD;  Location: [facility];  Service: Orthopedics;  Laterality: Right;  . FOOT ARTHRODESIS  2000   both feet  . RECTAL SURGERY    . TONSILLECTOMY      SOCIAL HISTORY: Social History   Socioeconomic History  . Marital status: Divorced    Spouse name: Not on file  . Number of children: 5  . Years of education: 9th  . Highest education level: Not on file  Occupational History  . Occupation: Disabled  Social Needs  . Financial resource strain: Not on file  . Food insecurity    Worry: Not on file    Inability: Not on file  . Transportation needs    Medical: Not on file    Non-medical: Not on file  Tobacco Use  . Smoking status: Current Some Day Smoker    Packs/day: 0.50    Years: 35.00    Pack years: 17.50    Types: Cigarettes    Last attempt to quit: 10/19/2012      Years since quitting: 6.5  . Smokeless tobacco: Never Used  Substance and Sexual Activity  . Alcohol use: No    Alcohol/week: 0.0 standard drinks  . Drug use: No  . Sexual activity: Never    Birth control/protection: Surgical    Comment: occ cigarette  Lifestyle  . Physical activity    Days per week: Not on file    Minutes per session: Not on file  . Stress: Not on file  Relationships  . Social Herbalist on phone: Not on file    Gets together: Not on file    Attends religious service: Not on file    Active member of club or organization: Not on file    Attends meetings of clubs or organizations: Not on file     Relationship status: Not on file  . Intimate partner violence    Fear of current or ex partner: Not on file    Emotionally abused: Not on file    Physically abused: Not on file    Forced sexual activity: Not on file  Other Topics Concern  . Not on file  Social History Narrative   Lives at home with home with her son (has five children).   Right-handed.   1 cup caffeine per day.    FAMILY HISTORY Family History  Problem Relation Age of Onset  . Diabetes Mother   . Other Mother        vertigo; chronic eye disease  . COPD Father   . COPD Sister   . Alcohol abuse Brother   . Diabetes Maternal Grandmother   . Diabetes Maternal Grandfather   . Diabetes Sister   . Other Sister        hearing problems  . Hyperlipidemia Sister   . Alcohol abuse Brother   . COPD Brother   . Alcohol abuse Brother   . Alcohol abuse Brother   . Other Brother        aneursym    ALLERGIES:  is allergic to chantix [varenicline]; divalproex sodium; pseudoeph-hydrocodone-gg; sulfa antibiotics; varenicline tartrate; ambien [zolpidem]; codeine; hydrocodone; paxil [paroxetine hcl]; sulfur; amoxicillin; and tape.  MEDICATIONS:  Current Outpatient Medications  Medication Sig Dispense Refill  . albuterol (PROVENTIL HFA;VENTOLIN HFA) 108 (90 Base) MCG/ACT inhaler Inhale 2 puffs into the lungs every 4 (four) hours as needed for wheezing or shortness of breath. 1 Inhaler 0  . diazepam (VALIUM) 5 MG tablet Take 1 tablet (5 mg total) by mouth every 8 (eight) hours as needed (vertigo). (Patient taking differently: Take 5 mg by mouth 3 (three) times daily. ) 10 tablet 0  . esomeprazole (NEXIUM) 40 MG capsule Take 40 mg by mouth 2 (two) times daily before a meal.     . estrogens, conjugated, (PREMARIN) 0.45 MG tablet Take 0.45 mg by mouth daily.     . fluticasone (FLONASE) 50 MCG/ACT nasal spray Place 1 spray into both nostrils daily as needed.  0  . glipiZIDE (GLUCOTROL) 5 MG tablet Take 5 mg by mouth 3 (three) times  daily before meals. Takes 10 mg before breakfast and 5 mg before supper.    [name] ipratropium-albuterol (DUONEB) 0.5-2.5 (3) MG/3ML SOLN Inhale 3 mLs into the lungs 2 (two) times daily as needed for wheezing or shortness of breath.  4  . lamoTRIgine (LAMICTAL) 100 MG tablet Take 100 mg by mouth daily.  0  . lamoTRIgine (LAMICTAL) 25 MG tablet Take 25 mg by mouth daily.  0  . metFORMIN (  GLUCOPHAGE) 500 MG tablet Take 500 mg by mouth 2 (two) times daily with a meal.    . oxybutynin (DITROPAN) 5 MG tablet Take 5 mg by mouth 2 (two) times daily.  0  . oxyCODONE (ROXICODONE) 15 MG immediate release tablet Take 7.5 mg by mouth every 2 (two) hours.     . pravastatin (PRAVACHOL) 40 MG tablet Take 40 mg by mouth daily. Reported on 06/06/2015    . pregabalin (LYRICA) 200 MG capsule Take 200 mg by mouth 2 (two) times daily.  4  . promethazine (PHENERGAN) 25 MG tablet Take 25 mg by mouth every 8 (eight) hours as needed for nausea.  0  . umeclidinium-vilanterol (ANORO ELLIPTA) 62.5-25 MCG/INH AEPB Take by mouth.    . valACYclovir (VALTREX) 500 MG tablet Take 500 mg by mouth daily as needed (shingles flare up).   4  . vitamin B-12 1000 MCG tablet Take 1 tablet (1,000 mcg total) by mouth daily with breakfast. 30 tablet 0  . VRAYLAR 4.5 MG CAPS Take 4.5 mg by mouth daily.  0   No current facility-administered medications for this visit.     PHYSICAL EXAMINATION:  ECOG PERFORMANCE STATUS: 1 - Symptomatic but completely ambulatory   There were no vitals filed for this visit.  There were no vitals filed for this visit.   Physical Exam Constitutional:      Appearance: Normal appearance.  HENT:     Head: Normocephalic.     Right Ear: External ear normal.     Left Ear: External ear normal.     Nose: Nose normal.     Mouth/Throat:     Mouth: Mucous membranes are moist.     Pharynx: Oropharynx is clear.  Eyes:     Conjunctiva/sclera: Conjunctivae normal.  Neck:     Musculoskeletal: Normal range of  motion.  Cardiovascular:     Rate and Rhythm: Normal rate and regular rhythm.     Pulses: Normal pulses.     Heart sounds: Normal heart sounds.  Pulmonary:     Effort: Pulmonary effort is normal.     Breath sounds: Normal breath sounds.  Abdominal:     General: Bowel sounds are normal.  Musculoskeletal: Normal range of motion.  Skin:    General: Skin is warm.  Neurological:     General: No focal deficit present.     Mental Status: She is alert.  Psychiatric:        Mood and Affect: Mood normal.        Behavior: Behavior normal.        Thought Content: Thought content normal.        Judgment: Judgment normal.      LABORATORY DATA: I have personally reviewed the data as listed:  No visits with results within 1 Month(s) from this visit.  Latest known visit with results is:  Admission on 04/22/2018, Discharged on 04/22/2018  Component Date Value Ref Range Status  . Sodium 04/22/2018 139  135 - 145 mmol/L Final  . Potassium 04/22/2018 3.9  3.5 - 5.1 mmol/L Final  . Chloride 04/22/2018 103  98 - 111 mmol/L Final  . CO2 04/22/2018 28  22 - 32 mmol/L Final  . Glucose, Bld 04/22/2018 100* 70 - 99 mg/dL Final  . BUN 04/22/2018 15  8 - 23 mg/dL Final  . Creatinine, Ser 04/22/2018 0.84  0.44 - 1.00 mg/dL Final  . Calcium 04/22/2018 9.0  8.9 - 10.3 mg/dL Final  . GFR calc non   Af Amer 04/22/2018 >60  >60 mL/min Final  . GFR calc Af Amer 04/22/2018 >60  >60 mL/min Final   Comment: (NOTE) The eGFR has been calculated using the CKD EPI equation. This calculation has not been validated in all clinical situations. eGFR's persistently <60 mL/min signify possible Chronic Kidney Disease.   . Anion gap 04/22/2018 8  5 - 15 Final   Performed at [facility], [address] [place], [place] [address]  . WBC 04/22/2018 7.8  4.0 - 10.5 K/uL Final  . RBC 04/22/2018 4.95  3.87 - 5.11 MIL/uL Final  . Hemoglobin 04/22/2018 15.2* 12.0 - 15.0 g/dL Final  . HCT 04/22/2018 47.3*  36.0 - 46.0 % Final  . MCV 04/22/2018 95.6  80.0 - 100.0 fL Final  . MCH 04/22/2018 30.7  26.0 - 34.0 pg Final  . MCHC 04/22/2018 32.1  30.0 - 36.0 g/dL Final  . RDW 04/22/2018 13.2  11.5 - 15.5 % Final  . Platelets 04/22/2018 221  150 - 400 K/uL Final  . nRBC 04/22/2018 0.0  0.0 - 0.2 % Final  . Neutrophils Relative % 04/22/2018 61  % Final  . Neutro Abs 04/22/2018 4.8  1.7 - 7.7 K/uL Final  . Lymphocytes Relative 04/22/2018 31  % Final  . Lymphs Abs 04/22/2018 2.4  0.7 - 4.0 K/uL Final  . Monocytes Relative 04/22/2018 5  % Final  . Monocytes Absolute 04/22/2018 0.4  0.1 - 1.0 K/uL Final  . Eosinophils Relative 04/22/2018 2  % Final  . Eosinophils Absolute 04/22/2018 0.2  0.0 - 0.5 K/uL Final  . Basophils Relative 04/22/2018 1  % Final  . Basophils Absolute 04/22/2018 0.1  0.0 - 0.1 K/uL Final  . Immature Granulocytes 04/22/2018 0  % Final  . Abs Immature Granulocytes 04/22/2018 0.03  0.00 - 0.07 K/uL Final   Performed at [facility], [address] [place], [place] [address]  . Color, Urine 04/22/2018 YELLOW  YELLOW Final  . APPearance 04/22/2018 CLEAR  CLEAR Final  . Specific Gravity, Urine 04/22/2018 1.016  1.005 - 1.030 Final  . pH 04/22/2018 7.0  5.0 - 8.0 Final  . Glucose, UA 04/22/2018 NEGATIVE  NEGATIVE mg/dL Final  . Hgb urine dipstick 04/22/2018 NEGATIVE  NEGATIVE Final  . Bilirubin Urine 04/22/2018 NEGATIVE  NEGATIVE Final  . Ketones, ur 04/22/2018 NEGATIVE  NEGATIVE mg/dL Final  . Protein, ur 04/22/2018 NEGATIVE  NEGATIVE mg/dL Final  . Nitrite 04/22/2018 NEGATIVE  NEGATIVE Final  . Leukocytes, UA 04/22/2018 NEGATIVE  NEGATIVE Final   Performed at [facility], [address] [place], [place] [address]    RADIOGRAPHIC STUDIES: I have personally reviewed the radiological images as listed and agree with the findings in the report  Ct Chest Lung Cancer Screening Low Dose Wo Contrast  Result Date: 04/20/2019 CLINICAL DATA:   63-year-old female current smoker, with 45 pack-year history of smoking, for initial lung cancer screening EXAM: CT CHEST WITHOUT CONTRAST LOW-DOSE FOR LUNG CANCER SCREENING TECHNIQUE: Multidetector CT imaging of the chest was performed following the standard protocol without IV contrast. COMPARISON:  None. FINDINGS: Cardiovascular: Heart is normal in size.  No pericardial effusion. No evidence of thoracic aortic aneurysm. Mild atherosclerotic calcifications of the aortic arch. Mild coronary atherosclerosis of the LAD. Mediastinum/Nodes: No suspicious mediastinal lymphadenopathy. Visualized thyroid is unremarkable. Lungs/Pleura: Mild centrilobular emphysematous changes, upper lung predominant. Mild linear scarring/atelectasis in the right upper and lower lobes. No focal consolidation. 3.0 mm subpleural nodule   in the lateral right middle lobe along the major fissure. No pleural effusion or pneumothorax. Upper Abdomen: Visualized upper abdomen is notable for geographic hepatic steatosis in the right hepatic lobe. Musculoskeletal: Mild degenerative changes of the visualized thoracolumbar spine. IMPRESSION: Lung-RADS 2, benign appearance or behavior. Continue annual screening with low-dose chest CT without contrast in 12 months. Aortic Atherosclerosis (ICD10-I70.0) and Emphysema (ICD10-J43.9). Electronically Signed   By: Julian Hy M.D.   On: 04/20/2019 12:38    ASSESSMENT/PLAN 1. Tobacco abuse -This patient meets the criteriafor low dose CT lung cancer screening. She isasymptomatic for any signs or symptoms of lung cancer. -The Shared Decision-Making Visit discussion included risks and benefits of screening, potential for follow up,diagnostic testing for abnormal scans, potential for false positive tests, over diagnosis, and discussion about total radiation exposure. -Patient stated willingness to undergo diagnostics and treatment as needed. - He wascounseled on smoking cessation to decreaserisk  of lung cancer, pulmonary disease, heart disease and stroke. - He was given a resource card with information on receiving free nicotine replacement therapy , and information about free smoking cessation classes. - He will present for her LDCT scan today and follow up with PCP.  All questions were answered. The patient knows to call the clinic with any problems, questions or concerns.  This note was electronically signed.    Roger Shelter, FNP  04/20/2019 1:21 PM

## 2019-04-20 NOTE — Assessment & Plan Note (Signed)
1. Tobacco abuse -This patient meets the criteriafor low dose CT lung cancer screening. Asymptomatic for any signs or symptoms of lung cancer. -The Shared Decision-Making Visit discussion included risks and benefits of screening, potential for follow up,diagnostic testing for abnormal scans, potential for false positive tests, over diagnosis, and discussion about total radiation exposure. -Patient stated willingness to undergo diagnostics and treatment as needed. - Patient  wascounseled on smoking cessation to decreaserisk of lung cancer, pulmonary disease, heart disease and stroke. - Patient  was given a resource card with information on receiving free nicotine replacement therapy , and information about free smoking cessation classes. - Patient will present for LDCT scan today and follow up with PCP.

## 2019-04-20 NOTE — Patient Instructions (Signed)
You were seen today for your shared decision making visit and a low-dose CT scan for lung cancer screening.    

## 2019-04-20 NOTE — Telephone Encounter (Signed)
Created in error

## 2019-04-20 NOTE — Progress Notes (Signed)
Patient notified via mail of LDCT l ung cancer screening results with recommendations to follow up in 12 months.  Also notified of incidental findings and need to follow up with PCP.  Patient's referring provider was sent a copy of results.    IMPRESSION: Lung-RADS 2, benign appearance or behavior. Continue annual screening with low-dose chest CT without contrast in 12 months.  Aortic Atherosclerosis (ICD10-I70.0) and Emphysema (ICD10-J43.9).   

## 2019-04-22 ENCOUNTER — Ambulatory Visit (HOSPITAL_COMMUNITY): Admission: RE | Admit: 2019-04-22 | Payer: BC Managed Care – PPO | Source: Ambulatory Visit

## 2019-04-22 ENCOUNTER — Encounter (HOSPITAL_COMMUNITY): Payer: BC Managed Care – PPO

## 2019-04-29 DIAGNOSIS — J019 Acute sinusitis, unspecified: Secondary | ICD-10-CM | POA: Diagnosis not present

## 2019-04-29 DIAGNOSIS — J449 Chronic obstructive pulmonary disease, unspecified: Secondary | ICD-10-CM | POA: Diagnosis not present

## 2019-04-29 DIAGNOSIS — J9611 Chronic respiratory failure with hypoxia: Secondary | ICD-10-CM | POA: Diagnosis not present

## 2019-04-29 DIAGNOSIS — G4733 Obstructive sleep apnea (adult) (pediatric): Secondary | ICD-10-CM | POA: Diagnosis not present

## 2019-04-30 ENCOUNTER — Ambulatory Visit (HOSPITAL_COMMUNITY)
Admission: RE | Admit: 2019-04-30 | Discharge: 2019-04-30 | Disposition: A | Discharge status: Home / Self Care | Payer: BC Managed Care – PPO | Source: Ambulatory Visit | Attending: Gastroenterology | Admitting: Gastroenterology

## 2019-04-30 ENCOUNTER — Encounter (HOSPITAL_COMMUNITY): Payer: Self-pay

## 2019-04-30 ENCOUNTER — Other Ambulatory Visit: Payer: Self-pay

## 2019-04-30 DIAGNOSIS — R1030 Lower abdominal pain, unspecified: Secondary | ICD-10-CM | POA: Insufficient documentation

## 2019-04-30 DIAGNOSIS — K5730 Diverticulosis of large intestine without perforation or abscess without bleeding: Secondary | ICD-10-CM | POA: Diagnosis not present

## 2019-04-30 HISTORY — DX: Type 2 diabetes mellitus without complications: E11.9

## 2019-04-30 LAB — POCT I-STAT CREATININE: Creatinine, Ser: 0.7 mg/dL (ref 0.44–1.00)

## 2019-04-30 MED ORDER — IOHEXOL 300 MG/ML  SOLN
100.0000 mL | Freq: Once | INTRAMUSCULAR | Status: AC | PRN
  Administered 2019-04-30: 100 mL via INTRAVENOUS

## 2019-05-04 ENCOUNTER — Other Ambulatory Visit: Payer: Self-pay | Admitting: Orthopedic Surgery

## 2019-05-04 ENCOUNTER — Other Ambulatory Visit (HOSPITAL_COMMUNITY): Payer: Self-pay | Admitting: Orthopedic Surgery

## 2019-05-04 DIAGNOSIS — M25562 Pain in left knee: Secondary | ICD-10-CM

## 2019-05-05 DIAGNOSIS — K582 Mixed irritable bowel syndrome: Secondary | ICD-10-CM | POA: Diagnosis not present

## 2019-05-05 DIAGNOSIS — E782 Mixed hyperlipidemia: Secondary | ICD-10-CM | POA: Diagnosis not present

## 2019-05-05 DIAGNOSIS — E1169 Type 2 diabetes mellitus with other specified complication: Secondary | ICD-10-CM | POA: Diagnosis not present

## 2019-05-11 DIAGNOSIS — J449 Chronic obstructive pulmonary disease, unspecified: Secondary | ICD-10-CM | POA: Diagnosis not present

## 2019-05-12 ENCOUNTER — Other Ambulatory Visit: Payer: Self-pay

## 2019-05-12 ENCOUNTER — Other Ambulatory Visit (HOSPITAL_COMMUNITY)
Admission: RE | Admit: 2019-05-12 | Discharge: 2019-05-12 | Disposition: A | Discharge status: Home / Self Care | Payer: BC Managed Care – PPO | Source: Ambulatory Visit | Attending: Pulmonary Disease | Admitting: Pulmonary Disease

## 2019-05-12 DIAGNOSIS — Z01812 Encounter for preprocedural laboratory examination: Secondary | ICD-10-CM | POA: Insufficient documentation

## 2019-05-12 DIAGNOSIS — Z20828 Contact with and (suspected) exposure to other viral communicable diseases: Secondary | ICD-10-CM | POA: Diagnosis not present

## 2019-05-12 LAB — SARS CORONAVIRUS 2 (TAT 6-24 HRS): SARS Coronavirus 2: NEGATIVE

## 2019-05-13 ENCOUNTER — Other Ambulatory Visit: Payer: Self-pay

## 2019-05-13 ENCOUNTER — Ambulatory Visit (HOSPITAL_COMMUNITY)
Admission: RE | Admit: 2019-05-13 | Discharge: 2019-05-13 | Disposition: A | Discharge status: Home / Self Care | Payer: BC Managed Care – PPO | Source: Ambulatory Visit | Attending: Pulmonary Disease | Admitting: Pulmonary Disease

## 2019-05-13 DIAGNOSIS — J441 Chronic obstructive pulmonary disease with (acute) exacerbation: Secondary | ICD-10-CM | POA: Insufficient documentation

## 2019-05-13 LAB — PULMONARY FUNCTION TEST
DL/VA % pred: 132 %
DL/VA: 5.36 ml/min/mmHg/L
DLCO unc % pred: 97 %
DLCO unc: 23.01 ml/min/mmHg
FEF 25-75 Post: 1.7 L/sec
FEF 25-75 Pre: 1.13 L/sec
FEF2575-%Change-Post: 50 %
FEF2575-%Pred-Post: 67 %
FEF2575-%Pred-Pre: 44 %
FEV1-%Change-Post: 9 %
FEV1-%Pred-Post: 69 %
FEV1-%Pred-Pre: 63 %
FEV1-Post: 2.06 L
FEV1-Pre: 1.88 L
FEV1FVC-%Change-Post: 12 %
FEV1FVC-%Pred-Pre: 88 %
FEV6-%Change-Post: -1 %
FEV6-%Pred-Post: 71 %
FEV6-%Pred-Pre: 72 %
FEV6-Post: 2.66 L
FEV6-Pre: 2.7 L
FEV6FVC-%Change-Post: 1 %
FEV6FVC-%Pred-Post: 103 %
FEV6FVC-%Pred-Pre: 102 %
FVC-%Change-Post: -2 %
FVC-%Pred-Post: 68 %
FVC-%Pred-Pre: 70 %
FVC-Post: 2.66 L
FVC-Pre: 2.73 L
Post FEV1/FVC ratio: 78 %
Post FEV6/FVC ratio: 100 %
Pre FEV1/FVC ratio: 69 %
Pre FEV6/FVC Ratio: 99 %
RV % pred: 80 %
RV: 1.85 L
TLC % pred: 78 %
TLC: 4.56 L

## 2019-05-13 MED ORDER — ALBUTEROL SULFATE (2.5 MG/3ML) 0.083% IN NEBU: 2.5000 mg | INHALATION_SOLUTION | Freq: Once | RESPIRATORY_TRACT | Status: DC

## 2019-05-14 DIAGNOSIS — M238X2 Other internal derangements of left knee: Secondary | ICD-10-CM | POA: Diagnosis not present

## 2019-05-14 DIAGNOSIS — M25562 Pain in left knee: Secondary | ICD-10-CM | POA: Diagnosis not present

## 2019-05-14 DIAGNOSIS — M1712 Unilateral primary osteoarthritis, left knee: Secondary | ICD-10-CM | POA: Diagnosis not present

## 2019-05-17 DIAGNOSIS — J301 Allergic rhinitis due to pollen: Secondary | ICD-10-CM | POA: Diagnosis not present

## 2019-05-17 DIAGNOSIS — G4733 Obstructive sleep apnea (adult) (pediatric): Secondary | ICD-10-CM | POA: Diagnosis not present

## 2019-05-17 DIAGNOSIS — K2100 Gastro-esophageal reflux disease with esophagitis, without bleeding: Secondary | ICD-10-CM | POA: Diagnosis not present

## 2019-05-17 DIAGNOSIS — J449 Chronic obstructive pulmonary disease, unspecified: Secondary | ICD-10-CM | POA: Diagnosis not present

## 2019-05-20 ENCOUNTER — Ambulatory Visit (HOSPITAL_COMMUNITY)
Admission: RE | Admit: 2019-05-20 | Discharge: 2019-05-20 | Disposition: A | Discharge status: Home / Self Care | Payer: BC Managed Care – PPO | Source: Ambulatory Visit | Attending: Orthopedic Surgery | Admitting: Orthopedic Surgery

## 2019-05-20 ENCOUNTER — Other Ambulatory Visit: Payer: Self-pay

## 2019-05-20 DIAGNOSIS — M25562 Pain in left knee: Secondary | ICD-10-CM | POA: Insufficient documentation

## 2019-05-20 DIAGNOSIS — M23304 Other meniscus derangements, unspecified medial meniscus, left knee: Secondary | ICD-10-CM | POA: Diagnosis not present

## 2019-05-25 DIAGNOSIS — E119 Type 2 diabetes mellitus without complications: Secondary | ICD-10-CM | POA: Diagnosis not present

## 2019-05-27 ENCOUNTER — Encounter (HOSPITAL_COMMUNITY): Payer: BC Managed Care – PPO

## 2019-06-07 DIAGNOSIS — S83242A Other tear of medial meniscus, current injury, left knee, initial encounter: Secondary | ICD-10-CM | POA: Diagnosis not present

## 2019-06-07 DIAGNOSIS — M25562 Pain in left knee: Secondary | ICD-10-CM | POA: Diagnosis not present

## 2019-06-07 DIAGNOSIS — M1712 Unilateral primary osteoarthritis, left knee: Secondary | ICD-10-CM | POA: Diagnosis not present

## 2019-06-11 DIAGNOSIS — J449 Chronic obstructive pulmonary disease, unspecified: Secondary | ICD-10-CM | POA: Diagnosis not present

## 2019-06-30 DIAGNOSIS — Z79899 Other long term (current) drug therapy: Secondary | ICD-10-CM | POA: Diagnosis not present

## 2019-06-30 DIAGNOSIS — M5136 Other intervertebral disc degeneration, lumbar region: Secondary | ICD-10-CM | POA: Diagnosis not present

## 2019-07-05 DIAGNOSIS — F310 Bipolar disorder, current episode hypomanic: Secondary | ICD-10-CM | POA: Diagnosis not present

## 2019-07-12 DIAGNOSIS — J449 Chronic obstructive pulmonary disease, unspecified: Secondary | ICD-10-CM | POA: Diagnosis not present

## 2019-08-09 DIAGNOSIS — J449 Chronic obstructive pulmonary disease, unspecified: Secondary | ICD-10-CM | POA: Diagnosis not present

## 2019-08-16 DIAGNOSIS — F310 Bipolar disorder, current episode hypomanic: Secondary | ICD-10-CM | POA: Diagnosis not present

## 2019-08-19 ENCOUNTER — Ambulatory Visit: Payer: BC Managed Care – PPO | Attending: Internal Medicine

## 2019-08-19 DIAGNOSIS — Z23 Encounter for immunization: Secondary | ICD-10-CM

## 2019-08-19 NOTE — Progress Notes (Signed)
   Covid-19 Vaccination Clinic  Name:  LYLLA MELNIKOV    MRN: [id number] DOB: 10-16-1955  08/19/2019  Ms. Wege was observed post Covid-19 immunization for 15 minutes without incident. She was provided with Vaccine Information Sheet and instruction to access the V-Safe system.   Ms. Shafer was instructed to call 911 with any severe reactions post vaccine: [name] Difficulty breathing  . Swelling of face and throat  . A fast heartbeat  . A bad rash all over body  . Dizziness and weakness   Immunizations Administered    Name Date Dose VIS Date Route   Moderna COVID-19 Vaccine 08/19/2019 11:01 AM 0.5 mL 05/11/2019 Intramuscular   Manufacturer: Moderna   Lot: [id number]   [id number]

## 2019-08-26 DIAGNOSIS — J019 Acute sinusitis, unspecified: Secondary | ICD-10-CM | POA: Diagnosis not present

## 2019-08-26 DIAGNOSIS — B9689 Other specified bacterial agents as the cause of diseases classified elsewhere: Secondary | ICD-10-CM | POA: Diagnosis not present

## 2019-08-26 DIAGNOSIS — E119 Type 2 diabetes mellitus without complications: Secondary | ICD-10-CM | POA: Diagnosis not present

## 2019-09-09 DIAGNOSIS — J449 Chronic obstructive pulmonary disease, unspecified: Secondary | ICD-10-CM | POA: Diagnosis not present

## 2019-09-21 ENCOUNTER — Ambulatory Visit: Payer: BC Managed Care – PPO

## 2019-09-23 ENCOUNTER — Ambulatory Visit: Payer: BC Managed Care – PPO | Attending: Internal Medicine

## 2019-09-23 DIAGNOSIS — Z23 Encounter for immunization: Secondary | ICD-10-CM

## 2019-09-23 NOTE — Progress Notes (Signed)
   Covid-19 Vaccination Clinic  Name:  TYLIN SAGERT    MRN: [id number] DOB: [birth year]  09/23/2019  Ms. Scheffler was observed post Covid-19 immunization for 15 minutes without incident. She was provided with Vaccine Information Sheet and instruction to access the V-Safe system.   Ms. Wirtz was instructed to call 911 with any severe reactions post vaccine: [name] Difficulty breathing  . Swelling of face and throat  . A fast heartbeat  . A bad rash all over body  . Dizziness and weakness   Immunizations Administered    Name Date Dose VIS Date Route   Moderna COVID-19 Vaccine 09/23/2019  8:25 AM 0.5 mL 05/11/2019 Intramuscular   Manufacturer: Moderna   Lot: [id number]   [id number]

## 2019-10-07 DIAGNOSIS — M5030 Other cervical disc degeneration, unspecified cervical region: Secondary | ICD-10-CM | POA: Diagnosis not present

## 2019-10-07 DIAGNOSIS — M5136 Other intervertebral disc degeneration, lumbar region: Secondary | ICD-10-CM | POA: Diagnosis not present

## 2019-10-09 DIAGNOSIS — J449 Chronic obstructive pulmonary disease, unspecified: Secondary | ICD-10-CM | POA: Diagnosis not present

## 2019-10-14 DIAGNOSIS — F310 Bipolar disorder, current episode hypomanic: Secondary | ICD-10-CM | POA: Diagnosis not present

## 2019-10-19 DIAGNOSIS — M5030 Other cervical disc degeneration, unspecified cervical region: Secondary | ICD-10-CM | POA: Diagnosis not present

## 2019-10-19 DIAGNOSIS — M5136 Other intervertebral disc degeneration, lumbar region: Secondary | ICD-10-CM | POA: Diagnosis not present

## 2019-10-19 DIAGNOSIS — N390 Urinary tract infection, site not specified: Secondary | ICD-10-CM | POA: Diagnosis not present

## 2019-10-19 DIAGNOSIS — M25512 Pain in left shoulder: Secondary | ICD-10-CM | POA: Diagnosis not present

## 2019-11-09 DIAGNOSIS — J449 Chronic obstructive pulmonary disease, unspecified: Secondary | ICD-10-CM | POA: Diagnosis not present

## 2019-11-09 DIAGNOSIS — G4733 Obstructive sleep apnea (adult) (pediatric): Secondary | ICD-10-CM | POA: Diagnosis not present

## 2019-11-22 ENCOUNTER — Ambulatory Visit (INDEPENDENT_AMBULATORY_CARE_PROVIDER_SITE_OTHER): Payer: BC Managed Care – PPO | Admitting: Nurse Practitioner

## 2019-11-22 ENCOUNTER — Encounter: Payer: Self-pay | Admitting: Nurse Practitioner

## 2019-11-22 DIAGNOSIS — J0110 Acute frontal sinusitis, unspecified: Secondary | ICD-10-CM | POA: Diagnosis not present

## 2019-11-22 MED ORDER — AZITHROMYCIN 500 MG PO TABS: 500.0000 mg | ORAL_TABLET | Freq: Every day | ORAL | 0 refills | Status: DC

## 2019-11-22 MED ORDER — SALINE SPRAY 0.65 % NA SOLN: 1.0000 | NASAL | 0 refills | Status: DC | PRN

## 2019-11-22 MED ORDER — ACETAMINOPHEN 500 MG PO TABS: 500.0000 mg | ORAL_TABLET | Freq: Four times a day (QID) | ORAL | 0 refills | Status: DC | PRN

## 2019-11-22 NOTE — Patient Instructions (Addendum)
Subacute frontal sinusitis Patient is 64 year old female she is currently at home doing a telephone visit.  Patient reports sinus pressure head pressure, drainage, sore throat and hoarseness in the last 2 to 4 days.  She denies cough, fever, shortness of breath, nausea or vomiting.  Patient reports this is not new.  Patient has had recurrent sinusitis in the past. Provided education to patient.  Advised to increase fluid, saline nasal sprays.  Started patient on azithromycin 500 mg daily for 3 days, Tylenol for headache as needed.  Patient has completed 2 doses of the Covid vaccine.  Follow-up with worsening or unresolved symptoms. Rx sent to pharmacy.   Sinusitis, Adult Sinusitis is soreness and swelling (inflammation) of your sinuses. Sinuses are hollow spaces in the bones around your face. They are located:  Around your eyes.  In the middle of your forehead.  Behind your nose.  In your cheekbones. Your sinuses and nasal passages are lined with a fluid called mucus. Mucus drains out of your sinuses. Swelling can trap mucus in your sinuses. This lets germs (bacteria, virus, or fungus) grow, which leads to infection. Most of the time, this condition is caused by a virus. What are the causes? This condition is caused by:  Allergies.  Asthma.  Germs.  Things that block your nose or sinuses.  Growths in the nose (nasal polyps).  Chemicals or irritants in the air.  Fungus (rare). What increases the risk? You are more likely to develop this condition if:  You have a weak body defense system (immune system).  You do a lot of swimming or diving.  You use nasal sprays too much.  You smoke. What are the signs or symptoms? The main symptoms of this condition are pain and a feeling of pressure around the sinuses. Other symptoms include:  Stuffy nose (congestion).  Runny nose (drainage).  Swelling and warmth in the sinuses.  Headache.  Toothache.  A cough that may get  worse at night.  Mucus that collects in the throat or the back of the nose (postnasal drip).  Being unable to smell and taste.  Being very tired (fatigue).  A fever.  Sore throat.  Bad breath. How is this diagnosed? This condition is diagnosed based on:  Your symptoms.  Your medical history.  A physical exam.  Tests to find out if your condition is short-term (acute) or long-term (chronic). Your doctor may: ? Check your nose for growths (polyps). ? Check your sinuses using a tool that has a light (endoscope). ? Check for allergies or germs. ? Do imaging tests, such as an MRI or CT scan. How is this treated? Treatment for this condition depends on the cause and whether it is short-term or long-term.  If caused by a virus, your symptoms should go away on their own within 10 days. You may be given medicines to relieve symptoms. They include: ? Medicines that shrink swollen tissue in the nose. ? Medicines that treat allergies (antihistamines). ? A spray that treats swelling of the nostrils. ? Rinses that help get rid of thick mucus in your nose (nasal saline washes).  If caused by bacteria, your doctor may wait to see if you will get better without treatment. You may be given antibiotic medicine if you have: ? A very bad infection. ? A weak body defense system.  If caused by growths in the nose, you may need to have surgery. Follow these instructions at home: Medicines  Take, use, or apply over-the-counter and  prescription medicines only as told by your doctor. These may include nasal sprays.  If you were prescribed an antibiotic medicine, take it as told by your doctor. Do not stop taking the antibiotic even if you start to feel better. Hydrate and humidify   Drink enough water to keep your pee (urine) pale yellow.  Use a cool mist humidifier to keep the humidity level in your home above 50%.  Breathe in steam for 10-15 minutes, 3-4 times a day, or as told by your  doctor. You can do this in the bathroom while a hot shower is running.  Try not to spend time in cool or dry air. Rest  Rest as much as you can.  Sleep with your head raised (elevated).  Make sure you get enough sleep each night. General instructions   Put a warm, moist washcloth on your face 3-4 times a day, or as often as told by your doctor. This will help with discomfort.  Wash your hands often with soap and water. If there is no soap and water, use hand sanitizer.  Do not smoke. Avoid being around people who are smoking (secondhand smoke).  Keep all follow-up visits as told by your doctor. This is important. Contact a doctor if:  You have a fever.  Your symptoms get worse.  Your symptoms do not get better within 10 days. Get help right away if:  You have a very bad headache.  You cannot stop throwing up (vomiting).  You have very bad pain or swelling around your face or eyes.  You have trouble seeing.  You feel confused.  Your neck is stiff.  You have trouble breathing. Summary  Sinusitis is swelling of your sinuses. Sinuses are hollow spaces in the bones around your face.  This condition is caused by tissues in your nose that become inflamed or swollen. This traps germs. These can lead to infection.  If you were prescribed an antibiotic medicine, take it as told by your doctor. Do not stop taking it even if you start to feel better.  Keep all follow-up visits as told by your doctor. This is important. This information is not intended to replace advice given to you by your health care provider. Make sure you discuss any questions you have with your health care provider. Document Revised: 10/27/2017 Document Reviewed: 10/27/2017 Elsevier Patient Education  [place].

## 2019-11-22 NOTE — Assessment & Plan Note (Addendum)
Patient is 64 year old female she is currently at home doing a telephone visit.  Patient reports sinus pressure head pressure, drainage, sore throat and hoarseness in the last 2 to 4 days.  She denies cough, fever, shortness of breath, nausea or vomiting.  Patient reports this is not new.  Patient has had recurrent sinusitis in the past. Provided education to patient.  Advised to increase fluid, saline nasal sprays.  Started patient on azithromycin 500 mg daily for 3 days, Tylenol for headache as needed.  Patient has completed 2 doses of the Covid vaccine.  Follow-up with worsening or unresolved symptoms. Rx sent to pharmacy.

## 2019-11-22 NOTE — Progress Notes (Signed)
Virtual Visit via telephone Note Due to COVID-19 pandemic this visit was conducted virtually. This visit type was conducted due to national recommendations for restrictions regarding the COVID-19 Pandemic (e.g. social distancing, sheltering in place) in an effort to limit this patient's exposure and mitigate transmission in our community. All issues noted in this document were discussed and addressed.  A physical exam was not performed with this format.  I connected with ADREANNA FICKEL on 11/22/19 at home by telephone and verified that I am speaking with the correct person using two identifiers. AGNIESZKA NEWHOUSE is currently located at home and patient is currently alone during visit. The provider, Ivy Lynn, NP is located in their office at time of visit.  I discussed the limitations, risks, security and privacy concerns of performing an evaluation and management service by telephone and the availability of in person appointments. I also discussed with the patient that there may be a patient responsible charge related to this service. The patient expressed understanding and agreed to proceed.   History and Present Illness:  Sinusitis This is a recurrent problem. The current episode started yesterday. The problem has been gradually worsening since onset. There has been no fever. Associated symptoms include congestion, headaches, a hoarse voice, sinus pressure, a sore throat and swollen glands. Pertinent negatives include no chills, coughing, ear pain or shortness of breath. Past treatments include nothing.       Review of Systems  Constitutional: Negative for chills and fever.  HENT: Positive for congestion, hoarse voice, sinus pressure and sore throat. Negative for ear pain.   Eyes: Negative.   Respiratory: Negative for cough and shortness of breath.   Cardiovascular: Negative.   Gastrointestinal: Negative.   Genitourinary: Negative.   Musculoskeletal: Negative.   Skin:  Negative.   Neurological: Positive for headaches.  Psychiatric/Behavioral: The patient is not nervous/anxious.         Assessment and Plan: Subacute frontal sinusitis Patient is 64 year old female she is currently at home doing a telephone visit.  Patient reports sinus pressure head pressure, drainage, sore throat and hoarseness in the last 2 to 4 days.  She denies cough, fever, shortness of breath, nausea or vomiting.  Patient reports this is not new.  Patient has had recurrent sinusitis in the past. Provided education to patient.  Advised to increase fluid, saline nasal sprays.  Started patient on azithromycin 500 mg daily for 3 days, Tylenol for headache as needed.  Patient has completed 2 doses of the Covid vaccine.  Follow-up with worsening or unresolved symptoms. Rx sent to pharmacy.   Follow Up Instructions: She knows to follow-up with unresolved or worsening symptoms.    I discussed the assessment and treatment plan with the patient. The patient was provided an opportunity to ask questions and all were answered. The patient agreed with the plan and demonstrated an understanding of the instructions.   The patient was advised to call back or seek an in-person evaluation if the symptoms worsen or if the condition fails to improve as anticipated.  The above assessment and management plan was discussed with the patient. The patient verbalized understanding of and has agreed to the management plan. Patient is aware to call the clinic if symptoms persist or worsen. Patient is aware when to return to the clinic for a follow-up visit. Patient educated on when it is appropriate to go to the emergency department.   Time call ended:    I provided 10 minutes of non-face-to-face time  during this encounter.    Ivy Lynn, NP

## 2019-11-23 DIAGNOSIS — G4733 Obstructive sleep apnea (adult) (pediatric): Secondary | ICD-10-CM | POA: Diagnosis not present

## 2019-11-23 DIAGNOSIS — G894 Chronic pain syndrome: Secondary | ICD-10-CM | POA: Diagnosis not present

## 2019-11-23 DIAGNOSIS — E1142 Type 2 diabetes mellitus with diabetic polyneuropathy: Secondary | ICD-10-CM | POA: Diagnosis not present

## 2019-11-23 DIAGNOSIS — J449 Chronic obstructive pulmonary disease, unspecified: Secondary | ICD-10-CM | POA: Diagnosis not present

## 2019-11-24 DIAGNOSIS — Z79899 Other long term (current) drug therapy: Secondary | ICD-10-CM | POA: Diagnosis not present

## 2019-11-30 ENCOUNTER — Telehealth (HOSPITAL_COMMUNITY): Payer: Self-pay | Admitting: Psychiatry

## 2019-11-30 DIAGNOSIS — F310 Bipolar disorder, current episode hypomanic: Secondary | ICD-10-CM | POA: Diagnosis not present

## 2019-11-30 NOTE — Telephone Encounter (Signed)
D:  Dr. Casimiro Needle referred pt to [facility].  A:  Placed call to orient pt.  Pt is hesitant to sign up d/t seeing her therapist every Friday.  "I have to see him every Friday."  Pt states she doesn't think she can afford the program also.  A:  Provided pt with support.  Encouraged pt to verify her insurance benefits.  Mentioned [facility] to pt.  Pt is hesitant about that group also.  "The doctor said he would be getting me into a group with people like me.  I am upset now..because I don't know what to do."  Case manager will discuss with Dr. Casimiro Needle.

## 2019-12-02 ENCOUNTER — Encounter: Payer: Self-pay | Admitting: Family Medicine

## 2019-12-02 ENCOUNTER — Ambulatory Visit (INDEPENDENT_AMBULATORY_CARE_PROVIDER_SITE_OTHER): Payer: BC Managed Care – PPO | Admitting: Family Medicine

## 2019-12-02 ENCOUNTER — Other Ambulatory Visit: Payer: Self-pay

## 2019-12-02 VITALS — BP 135/69 | HR 86 | Temp 97.5°F | Ht 69.0 in | Wt 171.2 lb

## 2019-12-02 DIAGNOSIS — I10 Essential (primary) hypertension: Secondary | ICD-10-CM

## 2019-12-02 DIAGNOSIS — E11649 Type 2 diabetes mellitus with hypoglycemia without coma: Secondary | ICD-10-CM | POA: Diagnosis not present

## 2019-12-02 DIAGNOSIS — M5136 Other intervertebral disc degeneration, lumbar region: Secondary | ICD-10-CM

## 2019-12-02 DIAGNOSIS — L989 Disorder of the skin and subcutaneous tissue, unspecified: Secondary | ICD-10-CM

## 2019-12-02 DIAGNOSIS — J449 Chronic obstructive pulmonary disease, unspecified: Secondary | ICD-10-CM | POA: Insufficient documentation

## 2019-12-02 DIAGNOSIS — R159 Full incontinence of feces: Secondary | ICD-10-CM | POA: Insufficient documentation

## 2019-12-02 DIAGNOSIS — R32 Unspecified urinary incontinence: Secondary | ICD-10-CM | POA: Insufficient documentation

## 2019-12-02 DIAGNOSIS — G6289 Other specified polyneuropathies: Secondary | ICD-10-CM

## 2019-12-02 DIAGNOSIS — F429 Obsessive-compulsive disorder, unspecified: Secondary | ICD-10-CM

## 2019-12-02 DIAGNOSIS — M5030 Other cervical disc degeneration, unspecified cervical region: Secondary | ICD-10-CM

## 2019-12-02 DIAGNOSIS — I152 Hypertension secondary to endocrine disorders: Secondary | ICD-10-CM | POA: Insufficient documentation

## 2019-12-02 DIAGNOSIS — F3164 Bipolar disorder, current episode mixed, severe, with psychotic features: Secondary | ICD-10-CM

## 2019-12-02 DIAGNOSIS — N952 Postmenopausal atrophic vaginitis: Secondary | ICD-10-CM | POA: Diagnosis not present

## 2019-12-02 DIAGNOSIS — J302 Other seasonal allergic rhinitis: Secondary | ICD-10-CM

## 2019-12-02 DIAGNOSIS — Z79899 Other long term (current) drug therapy: Secondary | ICD-10-CM

## 2019-12-02 DIAGNOSIS — F4310 Post-traumatic stress disorder, unspecified: Secondary | ICD-10-CM

## 2019-12-02 DIAGNOSIS — G4733 Obstructive sleep apnea (adult) (pediatric): Secondary | ICD-10-CM

## 2019-12-02 LAB — BAYER DCA HB A1C WAIVED: HB A1C (BAYER DCA - WAIVED): 5.9 % (ref <7.0)

## 2019-12-02 MED ORDER — ALBUTEROL SULFATE HFA 108 (90 BASE) MCG/ACT IN AERS: 2.0000 | INHALATION_SPRAY | Freq: Four times a day (QID) | RESPIRATORY_TRACT | 2 refills | Status: DC | PRN

## 2019-12-02 MED ORDER — FLUTICASONE PROPIONATE 50 MCG/ACT NA SUSP: 2.0000 | Freq: Every day | NASAL | 5 refills | Status: DC

## 2019-12-02 MED ORDER — GLIPIZIDE 5 MG PO TABS: 5.0000 mg | ORAL_TABLET | Freq: Every day | ORAL | 2 refills | Status: DC

## 2019-12-02 MED ORDER — ESTROGENS CONJUGATED 0.3 MG PO TABS: 0.3000 mg | ORAL_TABLET | Freq: Every day | ORAL | 2 refills | Status: DC

## 2019-12-02 NOTE — Progress Notes (Signed)
New Patient Office Visit  Assessment & Plan:  1. Type 2 diabetes mellitus with hypoglycemia without coma, without long-term current use of insulin (HCC) Lab Results  Component Value Date   HGBA1C 5.9 12/02/2019   HGBA1C 6.1 (H) 04/17/2016   HGBA1C 6.4 (H) 11/16/2015  - Diabetes is at goal of A1c < 7. - Medications: continue metformin; decrease Glipidize from 10 mg to 5 mg QD. - Home glucose monitoring: continue monitoring. - Patient is currently taking a statin. Patient is not taking an ACE-inhibitor/ARB.  - Urine Microalbumin/Creat Ratio: 12/02/19 - Bayer DCA Hb A1c Waived - Microalbumin / creatinine urine ratio - glipiZIDE (GLUCOTROL) 5 MG tablet; Take 1 tablet (5 mg total) by mouth daily before breakfast.  Dispense: 30 tablet; Refill: 2 - CBC with Differential/Platelet - CMP14+EGFR - Lipid panel - atorvastatin (LIPITOR) 40 MG tablet; Take 40 mg by mouth daily.  2. Essential hypertension - Well controlled on current regimen.  - CMP14+EGFR  3. Atrophic vaginitis - Discussed risks of taking estrogen. Patient agreeable to decrease dosage but not go off of it all together. Encouraged to call [facility] and see if the [facility] is an option for her. - estrogens, conjugated, (PREMARIN) 0.3 MG tablet; Take 1 tablet (0.3 mg total) by mouth daily.  Dispense: 30 tablet; Refill: 2  4. Current use of estrogen therapy - estrogens, conjugated, (PREMARIN) 0.3 MG tablet; Take 1 tablet (0.3 mg total) by mouth daily.  Dispense: 30 tablet; Refill: 2  5. Skin lesion of left arm - Ambulatory referral to Dermatology  6. Seasonal allergies - Patient felt Cynthia Deleon needed a change. Cynthia Deleon was previously using Atrovent nasal spray daily. Advised her to stop this and use Flonase. Continue Xyzal QD.  - fluticasone (FLONASE) 50 MCG/ACT nasal spray; Place 2 sprays into both nostrils daily.  Dispense: 16 g; Refill: 5 - levocetirizine (XYZAL) 5 MG tablet; Take 5 mg by mouth every evening.  7.  Chronic obstructive pulmonary disease, unspecified COPD type ([place]) - Well controlled on current regimen.   - albuterol (VENTOLIN HFA) 108 (90 Base) MCG/ACT inhaler; Inhale 2 puffs into the lungs every 6 (six) hours as needed for wheezing or shortness of breath.  Dispense: 18 g; Refill: 2  8. OSA (obstructive sleep apnea) - Well controlled. Managed by Dr. Merlene Deleon.  9. Other polyneuropathy - Well controlled on current regimen.  - Lipid panel - pregabalin (LYRICA) 150 MG capsule; Take 150 mg by mouth 2 (two) times daily.  10-11. Degeneration of lumbar intervertebral disc/DDD (degenerative disc disease), cervical - Well controlled on current regimen. Managed by Dr. Nelva Deleon.  12-14. PTSD (post-traumatic stress disorder)/Obsessive-compulsive disorder, unspecified type/Bipolar I disorder, most recent episode mixed, severe with psychotic features ([place]) - Well controlled on current regimen. Managed by Dr. Casimiro Needle.  - Lipid panel - lamoTRIgine (LAMICTAL) 200 MG tablet; Take 200 mg by mouth at bedtime. - sertraline (ZOLOFT) 100 MG tablet; Take 100 mg by mouth daily. - Suvorexant (BELSOMRA) 20 MG TABS; Take 1 tablet by mouth at bedtime.   Follow-up: Return in about 3 months (around 03/03/2020) for DM.   Cynthia Limes, MSN, APRN, FNP-C [facility]  Subjective:  [name], female    DOB: 12/22/1955  Age: 64 y.o. MRN: [id number]  Patient Care Team: Cynthia Brooklyn, FNP as PCP - General (Family Medicine) Cynthia Broad, MD as Consulting Physician (Physical Medicine and Rehabilitation) Cynthia Fredrickson, MD as Consulting Physician (Psychiatry)  CC:  Chief Complaint  Patient presents with  . Establish Care    [facility]   . [place]    Patient states after Cynthia Deleon got her second COVID shot on 09/23/19 Cynthia Deleon got a growth on the injection site.    HPI Cynthia Deleon presents to establish care.   Dr. Casimiro Needle prescribes sertraline,  valium, lamotrigine, and belsomra.  Dr. Nelva Deleon prescribes oxycodone.  Patient wears CPAP with oxygen nightly due to sleep apnea. This is managed by Dr. Merlene Deleon. Cynthia Deleon gets her supplies from [employer].   Diabetes: Patient presents for follow up of diabetes. Current symptoms include: none. Known diabetic complications: none. Medication compliance: yes. Current exercise: walking. Home blood sugar records: checking TID - never over 200, sometimes as low as 50s. Is Cynthia Deleon  on ACE inhibitor or angiotensin II receptor blocker? No. Is Cynthia Deleon on a statin? Yes.   Lab Results  Component Value Date   HGBA1C 5.9 12/02/2019   HGBA1C 6.1 (H) 04/17/2016   HGBA1C 6.4 (H) 11/16/2015   Lab Results  Component Value Date   LDLCALC 67 12/02/2019   CREATININE 0.70 12/02/2019     Patient takes Premarin and states Cynthia Deleon is unable to come off of it because if Cynthia Deleon does Cynthia Deleon is unable to be intimate and will scream and yell in pain.   Patient reports a mass to her left upper arm at the site of her second COVID-19 injection.    Review of Systems  Constitutional: Negative for chills, fever, malaise/fatigue and weight loss.  HENT: Negative for congestion, ear discharge, ear pain, nosebleeds, sinus pain, sore throat and tinnitus.   Eyes: Negative for blurred vision, double vision, pain, discharge and redness.  Respiratory: Negative for cough, shortness of breath and wheezing.   Cardiovascular: Negative for chest pain, palpitations and leg swelling.  Gastrointestinal: Negative for abdominal pain, constipation, diarrhea, heartburn, nausea and vomiting.  Genitourinary: Negative for dysuria, frequency and urgency.  Musculoskeletal: Negative for myalgias.  Skin: Negative for rash.  Neurological: Negative for dizziness, seizures, weakness and headaches.  Psychiatric/Behavioral: Negative for depression, substance abuse and suicidal ideas. The patient is not nervous/anxious.      Current Outpatient Medications:  .   albuterol (VENTOLIN HFA) 108 (90 Base) MCG/ACT inhaler, Inhale 2 puffs into the lungs every 6 (six) hours as needed for wheezing or shortness of breath., Disp: 18 g, Rfl: 2 .  atorvastatin (LIPITOR) 40 MG tablet, Take 40 mg by mouth daily., Disp: , Rfl:  .  diazepam (VALIUM) 5 MG tablet, Take 1 tablet (5 mg total) by mouth every 8 (eight) hours as needed (vertigo). (Patient taking differently: Take 5 mg by mouth in the morning and at bedtime. ), Disp: 10 tablet, Rfl: 0 .  estrogens, conjugated, (PREMARIN) 0.3 MG tablet, Take 1 tablet (0.3 mg total) by mouth daily., Disp: 30 tablet, Rfl: 2 .  fluticasone (FLONASE) 50 MCG/ACT nasal spray, Place 2 sprays into both nostrils daily., Disp: 16 g, Rfl: 5 .  glipiZIDE (GLUCOTROL) 5 MG tablet, Take 1 tablet (5 mg total) by mouth daily before breakfast., Disp: 30 tablet, Rfl: 2 .  glucose blood test strip, 1 each by Other route in the morning, at noon, in the evening, and at bedtime. Contour next test strips, Disp: , Rfl:  .  lamoTRIgine (LAMICTAL) 200 MG tablet, Take 200 mg by mouth at bedtime., Disp: , Rfl:  .  levocetirizine (XYZAL) 5 MG tablet, Take 5 mg by mouth every evening., Disp: , Rfl:  .  metFORMIN (GLUCOPHAGE) 500 MG tablet, Take 500 mg by mouth 2 (two) times daily with a meal., Disp: , Rfl:  .  Microlet Lancets MISC, by Does not apply route., Disp: , Rfl:  .  oxyCODONE (ROXICODONE) 15 MG immediate release tablet, Take 7.5 mg by mouth every 6 (six) hours as needed. , Disp: , Rfl:  .  pregabalin (LYRICA) 150 MG capsule, Take 150 mg by mouth 2 (two) times daily., Disp: , Rfl:  .  sertraline (ZOLOFT) 100 MG tablet, Take 100 mg by mouth daily., Disp: , Rfl:  .  Suvorexant (BELSOMRA) 20 MG TABS, Take 1 tablet by mouth at bedtime., Disp: , Rfl:  .  valACYclovir (VALTREX) 500 MG tablet, Take 500 mg by mouth daily as needed (shingles flare up). , Disp: , Rfl: 4   Allergies  Allergen Reactions  . Chantix [Varenicline] Other (See Comments)    Altered  mental status-Per patient was put on allergy list by Dr Lorriane Shire bu patient staes Cynthia Deleon is not allergic to this and is currently taking it.   . Divalproex Sodium Other (See Comments)    Hallucinations  Other reaction(s): Confusion  . Pseudoeph-Hydrocodone-Gg Nausea Only  . Sulfa Antibiotics Anaphylaxis and Nausea Only  . Varenicline Tartrate     Other reaction(s): Confusion  . Ambien [Zolpidem] Other (See Comments)    Causes sleep walking  . Codeine Nausea And Vomiting  . Hydrocodone Nausea And Vomiting  . Paxil [Paroxetine Hcl] Other (See Comments)    Causes ringing in the ears.   . Sulfur Other (See Comments)    Swelling of tongue  . Amoxicillin Rash    Has patient had a PCN reaction causing immediate rash, facial/tongue/throat swelling, SOB or lightheadedness with hypotension: No Has patient had a PCN reaction causing severe rash involving mucus membranes or skin necrosis: No Has patient had a PCN reaction that required hospitalization: No Has patient had a PCN reaction occurring within the last 10 years: Yes If all of the above answers are "NO", then may proceed with Cephalosporin use.   . Tape Rash    Past Medical History:  Diagnosis Date  . Arthritis    hands and knees  . Bipolar 1 disorder ([place])   . Borderline diabetic   . Chronic back pain   . Chronic neck pain   . Complication of anesthesia    high anxiety-does not want to be alone  . COPD (chronic obstructive pulmonary disease) ([place])   . Current use of estrogen therapy 01/12/2014  . Depression   . Diabetes mellitus without complication ([place])   . Headache   . Hot flashes 12/15/2013  . Hypertension   . IBS (irritable bowel syndrome)   . Incontinence   . Kidney stone   . 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 Panic attacks   . Peptic ulcer   . Peripheral neuropathy   . Rosacea   . Shingles   . Sleep apnea    uses a cpap-with oxygen    Past Surgical History:  Procedure Laterality Date  . ABDOMINAL  HYSTERECTOMY    . BUNIONECTOMY WITH HAMMERTOE RECONSTRUCTION Right 12/10/2012   Procedure: RIGHT FIRST METATARSAL CHEVRON BUNION CORRECTION,  2 AND 3 HAMMERTOE CORRECTION , RIGHT 3 AND 4 TOE NAIL EXCISION ;  Surgeon: Wylene Simmer, MD;  Location: [facility];  Service: Orthopedics;  Laterality: Right;  . FOOT ARTHRODESIS  2000   both feet  . RECTAL SURGERY    . TONSILLECTOMY  Family History  Problem Relation Age of Onset  . Diabetes Mother   . Other Mother        vertigo; chronic eye disease  . COPD Father   . COPD Sister   . Alcohol abuse Brother   . Diabetes Maternal Grandmother   . Diabetes Maternal Grandfather   . Diabetes Sister   . Other Sister        hearing problems  . Hyperlipidemia Sister   . Alcohol abuse Brother   . COPD Brother   . Alcohol abuse Brother   . Alcohol abuse Brother   . Other Brother        aneursym    Social History   Socioeconomic History  . Marital status: Divorced    Spouse name: Not on file  . Number of children: 5  . Years of education: 9th  . Highest education level: Not on file  Occupational History  . Occupation: Disabled  Tobacco Use  . Smoking status: Current Some Day Smoker    Packs/day: 0.50    Years: 35.00    Pack years: 17.50    Types: Cigarettes    Last attempt to quit: 10/19/2012    Years since quitting: 7.1  . Smokeless tobacco: Never Used  Vaping Use  . Vaping Use: Never used  Substance and Sexual Activity  . Alcohol use: No    Alcohol/week: 0.0 standard drinks  . Drug use: No  . Sexual activity: Never    Birth control/protection: Surgical    Comment: occ cigarette  Other Topics Concern  . Not on file  Social History Narrative   Lives at home with home with her son (has five children).   Right-handed.   1 cup caffeine per day.   Social Determinants of Health   Financial Resource Strain:   . Difficulty of Paying Living Expenses:   Food Insecurity:   . Worried About Charity fundraiser in  the Last Year:   . Arboriculturist in the Last Year:   Transportation Needs:   . Film/video editor (Medical):   [name] Lack of Transportation (Non-Medical):   Physical Activity:   . Days of Exercise per Week:   . Minutes of Exercise per Session:   Stress:   . Feeling of Stress :   Social Connections:   . Frequency of Communication with Friends and Family:   . Frequency of Social Gatherings with Friends and Family:   . Attends Religious Services:   . Active Member of Clubs or Organizations:   . Attends Archivist Meetings:   [name] Marital Status:   Intimate Partner Violence:   . Fear of Current or Ex-Partner:   . Emotionally Abused:   [name] Physically Abused:   . Sexually Abused:     Objective:   Today's Vitals: BP 135/69   Pulse 86   Temp (!) 97.5 F (36.4 C) (Temporal)   Ht '5\' 9"'  (1.753 m)   Wt 171 lb 3.2 oz (77.7 kg)   SpO2 94%   BMI 25.28 kg/m   Physical Exam Vitals reviewed.  Constitutional:      General: Cynthia Deleon is not in acute distress.    Appearance: Normal appearance. Cynthia Deleon is normal weight. Cynthia Deleon is not ill-appearing, toxic-appearing or diaphoretic.  HENT:     Head: Normocephalic and atraumatic.  Eyes:     General: No scleral icterus.       Right eye: No discharge.  Left eye: No discharge.     Conjunctiva/sclera: Conjunctivae normal.  Cardiovascular:     Rate and Rhythm: Normal rate and regular rhythm.     Heart sounds: Normal heart sounds. No murmur heard.  No friction rub. No gallop.   Pulmonary:     Effort: Pulmonary effort is normal. No respiratory distress.     Breath sounds: Normal breath sounds. No stridor. No wheezing, rhonchi or rales.  Musculoskeletal:        General: Normal range of motion.     Cervical back: Normal range of motion.  Skin:    General: Skin is warm and dry.     Capillary Refill: Capillary refill takes less than 2 seconds.  Neurological:     General: No focal deficit present.     Mental Status: Cynthia Deleon is alert and oriented  to person, place, and time. Mental status is at baseline.  Psychiatric:        Mood and Affect: Mood normal.        Behavior: Behavior normal.        Thought Content: Thought content normal.        Judgment: Judgment normal.

## 2019-12-02 NOTE — Patient Instructions (Addendum)
[facility] [phone] [place] [facility] for vaginal dryness

## 2019-12-03 DIAGNOSIS — G4733 Obstructive sleep apnea (adult) (pediatric): Secondary | ICD-10-CM | POA: Diagnosis not present

## 2019-12-03 DIAGNOSIS — J449 Chronic obstructive pulmonary disease, unspecified: Secondary | ICD-10-CM | POA: Diagnosis not present

## 2019-12-03 LAB — CBC WITH DIFFERENTIAL/PLATELET
Basophils Absolute: 0.1 10*3/uL (ref 0.0–0.2)
Basos: 1 %
EOS (ABSOLUTE): 0.1 10*3/uL (ref 0.0–0.4)
Eos: 2 %
Hematocrit: 43.6 % (ref 34.0–46.6)
Hemoglobin: 15.2 g/dL (ref 11.1–15.9)
Immature Grans (Abs): 0 10*3/uL (ref 0.0–0.1)
Immature Granulocytes: 1 %
Lymphocytes Absolute: 2.2 10*3/uL (ref 0.7–3.1)
Lymphs: 29 %
MCH: 32.5 pg (ref 26.6–33.0)
MCHC: 34.9 g/dL (ref 31.5–35.7)
MCV: 93 fL (ref 79–97)
Monocytes Absolute: 0.5 10*3/uL (ref 0.1–0.9)
Monocytes: 7 %
Neutrophils Absolute: 4.8 10*3/uL (ref 1.4–7.0)
Neutrophils: 60 %
Platelets: 214 10*3/uL (ref 150–450)
RBC: 4.67 x10E6/uL (ref 3.77–5.28)
RDW: 13.6 % (ref 11.7–15.4)
WBC: 7.8 10*3/uL (ref 3.4–10.8)

## 2019-12-03 LAB — MICROALBUMIN / CREATININE URINE RATIO
Creatinine, Urine: 122.4 mg/dL
Microalb/Creat Ratio: 5 mg/g creat (ref 0–29)
Microalbumin, Urine: 6.7 ug/mL

## 2019-12-03 LAB — LIPID PANEL
Chol/HDL Ratio: 3.3 ratio (ref 0.0–4.4)
Cholesterol, Total: 147 mg/dL (ref 100–199)
HDL: 45 mg/dL (ref >39)
LDL Chol Calc (NIH): 67 mg/dL (ref 0–99)
Triglycerides: 217 mg/dL — ABNORMAL HIGH (ref 0–149)
VLDL Cholesterol Cal: 35 mg/dL (ref 5–40)

## 2019-12-03 LAB — CMP14+EGFR
ALT: 10 IU/L (ref 0–32)
AST: 16 IU/L (ref 0–40)
Albumin/Globulin Ratio: 2.3 — ABNORMAL HIGH (ref 1.2–2.2)
Albumin: 4.4 g/dL (ref 3.8–4.8)
Alkaline Phosphatase: 84 IU/L (ref 48–121)
BUN/Creatinine Ratio: 26 (ref 12–28)
BUN: 18 mg/dL (ref 8–27)
Bilirubin Total: 0.3 mg/dL (ref 0.0–1.2)
CO2: 24 mmol/L (ref 20–29)
Calcium: 10.1 mg/dL (ref 8.7–10.3)
Chloride: 104 mmol/L (ref 96–106)
Creatinine, Ser: 0.7 mg/dL (ref 0.57–1.00)
GFR calc Af Amer: 107 mL/min/{1.73_m2} (ref >59)
GFR calc non Af Amer: 93 mL/min/{1.73_m2} (ref >59)
Globulin, Total: 1.9 g/dL (ref 1.5–4.5)
Glucose: 134 mg/dL — ABNORMAL HIGH (ref 65–99)
Potassium: 4.9 mmol/L (ref 3.5–5.2)
Sodium: 140 mmol/L (ref 134–144)
Total Protein: 6.3 g/dL (ref 6.0–8.5)

## 2019-12-07 ENCOUNTER — Telehealth: Payer: Self-pay | Admitting: Family Medicine

## 2019-12-07 NOTE — Telephone Encounter (Signed)
She will try benadryl for itching and call if things get worse.

## 2019-12-08 ENCOUNTER — Encounter: Payer: Self-pay | Admitting: Family

## 2019-12-08 ENCOUNTER — Ambulatory Visit (INDEPENDENT_AMBULATORY_CARE_PROVIDER_SITE_OTHER): Payer: BC Managed Care – PPO | Admitting: Family

## 2019-12-08 ENCOUNTER — Other Ambulatory Visit: Payer: Self-pay | Admitting: Family

## 2019-12-08 ENCOUNTER — Other Ambulatory Visit: Payer: Self-pay

## 2019-12-08 VITALS — BP 106/69 | HR 81 | Temp 97.0°F | Ht 69.0 in | Wt 169.4 lb

## 2019-12-08 DIAGNOSIS — W57XXXA Bitten or stung by nonvenomous insect and other nonvenomous arthropods, initial encounter: Secondary | ICD-10-CM | POA: Diagnosis not present

## 2019-12-08 DIAGNOSIS — S80862A Insect bite (nonvenomous), left lower leg, initial encounter: Secondary | ICD-10-CM | POA: Diagnosis not present

## 2019-12-08 DIAGNOSIS — R300 Dysuria: Secondary | ICD-10-CM

## 2019-12-08 LAB — URINALYSIS, COMPLETE
Bilirubin, UA: NEGATIVE
Glucose, UA: NEGATIVE
Ketones, UA: NEGATIVE
Nitrite, UA: POSITIVE — AB
Specific Gravity, UA: 1.025 (ref 1.005–1.030)
Urobilinogen, Ur: 0.2 mg/dL (ref 0.2–1.0)
pH, UA: 6 (ref 5.0–7.5)

## 2019-12-08 LAB — MICROSCOPIC EXAMINATION
Epithelial Cells (non renal): >10 /hpf — AB (ref 0–10)
Renal Epithel, UA: NONE SEEN /hpf
WBC, UA: >30 /hpf — AB (ref 0–5)

## 2019-12-08 MED ORDER — FLUCONAZOLE 150 MG PO TABS
150.0000 mg | ORAL_TABLET | Freq: Once | ORAL | 0 refills | Status: AC
Start: 2019-12-08 — End: 2019-12-08

## 2019-12-08 MED ORDER — CEPHALEXIN 500 MG PO CAPS
500.0000 mg | ORAL_CAPSULE | Freq: Two times a day (BID) | ORAL | 0 refills | Status: DC
Start: 2019-12-08 — End: 2020-01-13

## 2019-12-08 MED ORDER — DOXYCYCLINE HYCLATE 100 MG PO TABS: 200.0000 mg | ORAL_TABLET | Freq: Once | ORAL | 0 refills | Status: AC

## 2019-12-08 MED ORDER — TRIAMCINOLONE ACETONIDE 0.5 % EX OINT: 1.0000 "application " | TOPICAL_OINTMENT | Freq: Two times a day (BID) | CUTANEOUS | 0 refills | Status: DC

## 2019-12-08 MED ORDER — DOXYCYCLINE HYCLATE 100 MG PO TABS: 100.0000 mg | ORAL_TABLET | Freq: Two times a day (BID) | ORAL | 0 refills | Status: DC

## 2019-12-08 NOTE — Patient Instructions (Signed)

## 2019-12-08 NOTE — Progress Notes (Signed)
Subjective:    [name], female    DOB: 03-08-1956, 64 y.o.   MRN: [id number]  Chief Complaint  Patient presents with  . Tick Removal  . Dysuria   PT presents to the office today a tick bite on left medial ankle that she pulled off Friday evening. She is not sure how long it was attached. Denies any fever, new joint pain, or new rash. She reports the area itches and has had intermittent headaches.  Dysuria  This is a recurrent problem. The current episode started in the past 7 days. The problem occurs intermittently. The problem has been waxing and waning. The quality of the pain is described as burning. Pertinent negatives include no discharge, flank pain, hematuria, urgency or vomiting. She has tried increased fluids for the symptoms. The treatment provided moderate relief.      Review of Systems  Gastrointestinal: Negative for vomiting.  Genitourinary: Positive for dysuria. Negative for flank pain, hematuria and urgency.  All other systems reviewed and are negative.      Objective:   Physical Exam Vitals reviewed.  Constitutional:      General: She is not in acute distress.    Appearance: She is well-developed.  HENT:     Head: Normocephalic and atraumatic.  Eyes:     Pupils: Pupils are equal, round, and reactive to light.  Neck:     Thyroid: No thyromegaly.  Cardiovascular:     Rate and Rhythm: Normal rate and regular rhythm.     Heart sounds: Normal heart sounds. No murmur heard.   Pulmonary:     Effort: Pulmonary effort is normal. No respiratory distress.     Breath sounds: Normal breath sounds. No wheezing.  Abdominal:     General: Bowel sounds are normal. There is no distension.     Palpations: Abdomen is soft.     Tenderness: There is no abdominal tenderness.  Musculoskeletal:        General: No tenderness. Normal range of motion.     Cervical back: Normal range of motion and neck supple.  Skin:    General: Skin is warm and dry.      Findings: Rash present.     Comments: Left medial ankle erythemas approx 1X1 cm erythemas, fluid filled area  Neurological:     Mental Status: She is alert and oriented to person, place, and time.     Cranial Nerves: No cranial nerve deficit.     Deep Tendon Reflexes: Reflexes are normal and symmetric.  Psychiatric:        Behavior: Behavior normal.        Thought Content: Thought content normal.        Judgment: Judgment normal.     BP 106/69   Pulse 81   Temp (!) 97 F (36.1 C) (Temporal)   Ht 5\' 9"  (1.753 m)   Wt 169 lb 6.4 oz (76.8 kg)   BMI 25.02 kg/m        Assessment & Plan:  Cynthia Deleon comes in today with chief complaint of Tick Removal and Dysuria   Diagnosis and orders addressed:  1. Dysuria - Urinalysis, Complete  2. Tick bite of left lower leg, initial encounter -Pt to report any new fever, joint pain, or rash -Wear protective clothing while outside- Long sleeves and long pants -Put insect repellent on all exposed skin and along clothing -Take a shower as soon as possible after being outside Do not scratch -  triamcinolone ointment (KENALOG) 0.5 %; Apply 1 application topically 2 (two) times daily.  Dispense: 30 g; Refill: 0 - doxycycline (VIBRA-TABS) 100 MG tablet; Take 2 tablets (200 mg total) by mouth once for 1 dose.  Dispense: 2 tablet; Refill: 0   [name], FNP

## 2019-12-09 ENCOUNTER — Telehealth: Payer: Self-pay | Admitting: Family Medicine

## 2019-12-09 DIAGNOSIS — J449 Chronic obstructive pulmonary disease, unspecified: Secondary | ICD-10-CM | POA: Diagnosis not present

## 2019-12-10 NOTE — Telephone Encounter (Signed)
Patient aware, script is ready. 

## 2019-12-11 ENCOUNTER — Other Ambulatory Visit: Payer: Self-pay | Admitting: Family

## 2019-12-17 ENCOUNTER — Encounter: Payer: Self-pay | Admitting: Nurse Practitioner

## 2019-12-17 ENCOUNTER — Other Ambulatory Visit: Payer: Self-pay

## 2019-12-17 ENCOUNTER — Ambulatory Visit (INDEPENDENT_AMBULATORY_CARE_PROVIDER_SITE_OTHER): Payer: BC Managed Care – PPO | Admitting: Nurse Practitioner

## 2019-12-17 VITALS — BP 95/59 | HR 80 | Temp 97.2°F | Ht 69.0 in | Wt 169.2 lb

## 2019-12-17 DIAGNOSIS — E0801 Diabetes mellitus due to underlying condition with hyperosmolarity with coma: Secondary | ICD-10-CM

## 2019-12-17 DIAGNOSIS — I10 Essential (primary) hypertension: Secondary | ICD-10-CM | POA: Diagnosis not present

## 2019-12-17 DIAGNOSIS — E162 Hypoglycemia, unspecified: Secondary | ICD-10-CM | POA: Diagnosis not present

## 2019-12-17 DIAGNOSIS — H538 Other visual disturbances: Secondary | ICD-10-CM

## 2019-12-17 DIAGNOSIS — Z794 Long term (current) use of insulin: Secondary | ICD-10-CM

## 2019-12-17 LAB — GLUCOSE HEMOCUE WAIVED: Glu Hemocue Waived: 48 mg/dL — ABNORMAL LOW (ref 65–99)

## 2019-12-17 NOTE — Assessment & Plan Note (Signed)
Patient is a 64 year old female presents with history of diabetes mellitus without complications, diagnosed in 2020.  Compliance with treatment has been poor.  The patient is not taking medication as directed she tries to maintain a diabetic diet, and follow-up as directed. Patient is not keeping a glucose diary.  Today sugar is 48 in clinic.  Associated symptoms include; blurry vision, fatigue, polydipsia, polyuria and weakness, Provided education to patient on signs and symptoms of hypoglycemia, progressive diabetes mellitus, checking blood sugars, Follow-up with unresolved or worsening symptoms.

## 2019-12-17 NOTE — Patient Instructions (Signed)
Hypoglycemia Hypoglycemia is when the sugar (glucose) level in your blood is too low. Signs of low blood sugar may include:  Feeling: ? Hungry. ? Worried or nervous (anxious). ? Sweaty and clammy. ? Confused. ? Dizzy. ? Sleepy. ? Sick to your stomach (nauseous).  Having: ? A fast heartbeat. ? A headache. ? A change in your vision. ? Tingling or no feeling (numbness) around your mouth, lips, or tongue. ? Jerky movements that you cannot control (seizure).  Having trouble with: ? Moving (coordination). ? Sleeping. ? Passing out (fainting). ? Getting upset easily (irritability). Low blood sugar can happen to people who have diabetes and people who do not have diabetes. Low blood sugar can happen quickly, and it can be an emergency. Treating low blood sugar Low blood sugar is often treated by eating or drinking something sugary right away, such as:  Fruit juice, 4-6 oz (120-150 mL).  Regular soda (not diet soda), 4-6 oz (120-150 mL).  Low-fat milk, 4 oz (120 mL).  Several pieces of hard candy.  Sugar or honey, 1 Tbsp (15 mL). Treating low blood sugar if you have diabetes If you can think clearly and swallow safely, follow the 15:15 rule:  Take 15 grams of a fast-acting carb (carbohydrate). Talk with your doctor about how much you should take.  Always keep a source of fast-acting carb with you, such as: ? Sugar tablets (glucose pills). Take 3-4 pills. ? 6-8 pieces of hard candy. ? 4-6 oz (120-150 mL) of fruit juice. ? 4-6 oz (120-150 mL) of regular (not diet) soda. ? 1 Tbsp (15 mL) honey or sugar.  Check your blood sugar 15 minutes after you take the carb.  If your blood sugar is still at or below 70 mg/dL (3.9 mmol/L), take 15 grams of a carb again.  If your blood sugar does not go above 70 mg/dL (3.9 mmol/L) after 3 tries, get help right away.  After your blood sugar goes back to normal, eat a meal or a snack within 1 hour.  Treating very low blood sugar If your  blood sugar is at or below 54 mg/dL (3 mmol/L), you have very low blood sugar (severe hypoglycemia). This may also cause:  Passing out.  Jerky movements you cannot control (seizure).  Losing consciousness (coma). This is an emergency. Do not wait to see if the symptoms will go away. Get medical help right away. Call your local emergency services (911 in the [place] Do not drive yourself to the hospital. If you have very low blood sugar and you cannot eat or drink, you may need a glucagon shot (injection). A family member or friend should learn how to check your blood sugar and how to give you a glucagon shot. Ask your doctor if you need to have a glucagon shot kit at home. Follow these instructions at home: General instructions  Take over-the-counter and prescription medicines only as told by your doctor.  Stay aware of your blood sugar as told by your doctor.  Limit alcohol intake to no more than 1 drink a day for nonpregnant women and 2 drinks a day for men. One drink equals 12 oz of beer (355 mL), 5 oz of wine (148 mL), or 1 oz of hard liquor (44 mL).  Keep all follow-up visits as told by your doctor. This is important. If you have diabetes:   Follow your diabetes care plan as told by your doctor. Make sure you: ? Know the signs of low blood sugar. ?  Take your medicines as told. ? Follow your exercise and meal plan. ? Eat on time. Do not skip meals. ? Check your blood sugar as often as told by your doctor. Always check it before and after exercise. ? Follow your sick day plan when you cannot eat or drink normally. Make this plan ahead of time with your doctor.  Share your diabetes care plan with: ? Your work or school. ? People you live with.  Check your pee (urine) for ketones: ? When you are sick. ? As told by your doctor.  Carry a card or wear jewelry that says you have diabetes. Contact a doctor if:  You have trouble keeping your blood sugar in your target  range.  You have low blood sugar often. Get help right away if:  You still have symptoms after you eat or drink something sugary.  Your blood sugar is at or below 54 mg/dL (3 mmol/L).  You have jerky movements that you cannot control.  You pass out. These symptoms may be an emergency. Do not wait to see if the symptoms will go away. Get medical help right away. Call your local emergency services (911 in the [place] Do not drive yourself to the hospital. Summary  Hypoglycemia happens when the level of sugar (glucose) in your blood is too low.  Low blood sugar can happen to people who have diabetes and people who do not have diabetes. Low blood sugar can happen quickly, and it can be an emergency.  Make sure you know the signs of low blood sugar and know how to treat it.  Always keep a source of sugar (fast-acting carb) with you to treat low blood sugar. This information is not intended to replace advice given to you by your health care provider. Make sure you discuss any questions you have with your health care provider. Document Revised: 09/17/2018 Document Reviewed: 06/30/2015 Elsevier Patient Education  2020 Elsevier Inc.  

## 2019-12-17 NOTE — Assessment & Plan Note (Signed)
Patient is a 64 year old female who presents to clinic today with symptoms of hypoglycemia reporting excessive sleepiness, weakness and double vision.  Patient reports the symptoms have been present in the last 2 weeks and getting worse.  Patient reports decreased appetite, patient started drinking daily Ensure to improve appetite about a week ago.  No signs and symptoms of nausea, vomiting, fever and chills. Patient's glucose level checked in clinic, 48 Patient given peanut butter crackers and Sprite Provided education to patient on signs and symptoms of hypoglycemia and how to check blood sugar levels at home. Patient knows to follow-up with worsening or unresolved symptoms.

## 2019-12-17 NOTE — Progress Notes (Signed)
Acute Office Visit  Subjective:    [name], female    DOB: 1956-02-27, 64 y.o.   MRN: [id number]  Chief Complaint  Patient presents with  . Extremity Weakness    doesn't have strength, weakness in legs, falls x 12 in last week, bruising, no weakness on one side more than other said she did hit her head 2 days ago on top of head  . Blurred Vision    started seeing 2/3 lines on her way here today    HPI Patient is following up in clinic today with symptoms of hypoglycemia.  Patient is reporting excessive sleepiness, weakness and double vision, patient reports these symptoms have been present in the last 2 weeks and getting worse.  Patient reports decreased appetite.  Patient started drinking daily Ensure to improve appetite about a week ago.  No signs or symptoms of nausea, vomiting, fever, and chills.    The patient presents with history of  diabetes mellitus without complications. Patient was diagnosed in 2020. Compliance with treatment has been poor;  the patient is not taking medication as directed she tries to, maintains a diabetic diet, and follow up as directed , Patient  is not keeping a glucose diary.  Today sugar is 48. Patient has associated symptoms, including blurred vision, fatigue, polydipsia and polyuria .   Past Medical History:  Diagnosis Date  . Arthritis    hands and knees  . Bipolar 1 disorder ([place])   . Borderline diabetic   . Chronic back pain   . Chronic neck pain   . Complication of anesthesia    high anxiety-does not want to be alone  . COPD (chronic obstructive pulmonary disease) ([place])   . Current use of estrogen therapy 01/12/2014  . Depression   . Diabetes mellitus without complication ([place])   . Headache   . Hot flashes 12/15/2013  . Hypertension   . IBS (irritable bowel syndrome)   . Incontinence   . Kidney stone   . 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 Panic attacks   . Peptic ulcer   . Peripheral neuropathy   . Rosacea    . Shingles   . Sleep apnea    uses a cpap-with oxygen    Past Surgical History:  Procedure Laterality Date  . ABDOMINAL HYSTERECTOMY    . BUNIONECTOMY WITH HAMMERTOE RECONSTRUCTION Right 12/10/2012   Procedure: RIGHT FIRST METATARSAL CHEVRON BUNION CORRECTION,  2 AND 3 HAMMERTOE CORRECTION , RIGHT 3 AND 4 TOE NAIL EXCISION ;  Surgeon: Wylene Simmer, MD;  Location: [facility];  Service: Orthopedics;  Laterality: Right;  . FOOT ARTHRODESIS  2000   both feet  . RECTAL SURGERY    . TONSILLECTOMY      Family History  Problem Relation Age of Onset  . Diabetes Mother   . Other Mother        vertigo; chronic eye disease  . COPD Father   . COPD Sister   . Alcohol abuse Brother   . Diabetes Maternal Grandmother   . Diabetes Maternal Grandfather   . Diabetes Sister   . Other Sister        hearing problems  . Hyperlipidemia Sister   . Alcohol abuse Brother   . COPD Brother   . Alcohol abuse Brother   . Alcohol abuse Brother   . Other Brother        aneursym    Social History   Socioeconomic History  .  Marital status: Divorced    Spouse name: Not on file  . Number of children: 5  . Years of education: 9th  . Highest education level: Not on file  Occupational History  . Occupation: Disabled  Tobacco Use  . Smoking status: Current Some Day Smoker    Packs/day: 0.50    Years: 35.00    Pack years: 17.50    Types: Cigarettes    Last attempt to quit: 10/19/2012    Years since quitting: 7.1  . Smokeless tobacco: Never Used  Vaping Use  . Vaping Use: Never used  Substance and Sexual Activity  . Alcohol use: No    Alcohol/week: 0.0 standard drinks  . Drug use: No  . Sexual activity: Never    Birth control/protection: Surgical    Comment: occ cigarette  Other Topics Concern  . Not on file  Social History Narrative   Lives at home with home with her son (has five children).   Right-handed.   1 cup caffeine per day.   Social Determinants of Health    Financial Resource Strain:   . Difficulty of Paying Living Expenses:   Food Insecurity:   . Worried About Charity fundraiser in the Last Year:   . Arboriculturist in the Last Year:   Transportation Needs:   . Film/video editor (Medical):   [name] Lack of Transportation (Non-Medical):   Physical Activity:   . Days of Exercise per Week:   . Minutes of Exercise per Session:   Stress:   . Feeling of Stress :   Social Connections:   . Frequency of Communication with Friends and Family:   . Frequency of Social Gatherings with Friends and Family:   . Attends Religious Services:   . Active Member of Clubs or Organizations:   . Attends Archivist Meetings:   [name] Marital Status:   Intimate Partner Violence:   . Fear of Current or Ex-Partner:   . Emotionally Abused:   [name] Physically Abused:   . Sexually Abused:     Outpatient Medications Prior to Visit  Medication Sig Dispense Refill  . albuterol (VENTOLIN HFA) 108 (90 Base) MCG/ACT inhaler Inhale 2 puffs into the lungs every 6 (six) hours as needed for wheezing or shortness of breath. 18 g 2  . atorvastatin (LIPITOR) 40 MG tablet Take 40 mg by mouth daily.    . cephALEXin (KEFLEX) 500 MG capsule Take 1 capsule (500 mg total) by mouth 2 (two) times daily. 14 capsule 0  . cyclobenzaprine (FLEXERIL) 10 MG tablet Take 10 mg by mouth 3 (three) times daily as needed.    [name] DEXILANT 60 MG capsule Take 1 capsule by mouth daily.    . diazepam (VALIUM) 5 MG tablet Take 1 tablet (5 mg total) by mouth every 8 (eight) hours as needed (vertigo). (Patient taking differently: Take 5 mg by mouth in the morning and at bedtime. ) 10 tablet 0  . estrogens, conjugated, (PREMARIN) 0.3 MG tablet Take 1 tablet (0.3 mg total) by mouth daily. 30 tablet 2  . fluticasone (FLONASE) 50 MCG/ACT nasal spray Place 2 sprays into both nostrils daily. 16 g 5  . glipiZIDE (GLUCOTROL) 5 MG tablet Take 1 tablet (5 mg total) by mouth daily before breakfast. 30 tablet 2   . glucose blood (CONTOUR NEXT TEST) test strip Test BS 4 times daily Dx E11.9 400 strip 3  . lamoTRIgine (LAMICTAL) 200 MG tablet Take 200 mg by mouth at  bedtime.     [name] levocetirizine (XYZAL) 5 MG tablet Take 5 mg by mouth every evening.    . metFORMIN (GLUCOPHAGE) 500 MG tablet Take 500 mg by mouth 2 (two) times daily with a meal.    . Microlet Lancets MISC by Does not apply route.    [name] oxyCODONE (ROXICODONE) 15 MG immediate release tablet Take 7.5 mg by mouth every 6 (six) hours as needed.     . pregabalin (LYRICA) 150 MG capsule Take 150 mg by mouth 2 (two) times daily.    . sertraline (ZOLOFT) 100 MG tablet Take 100 mg by mouth daily.     . Suvorexant (BELSOMRA) 20 MG TABS Take 1 tablet by mouth at bedtime.    . triamcinolone ointment (KENALOG) 0.5 % Apply 1 application topically 2 (two) times daily. 30 g 0  . valACYclovir (VALTREX) 500 MG tablet Take 500 mg by mouth daily as needed (shingles flare up).   4   No facility-administered medications prior to visit.    Allergies  Allergen Reactions  . Chantix [Varenicline] Other (See Comments)    Altered mental status-Per patient was put on allergy list by Dr Lorriane Shire bu patient staes she is not allergic to this and is currently taking it.   . Divalproex Sodium Other (See Comments)    Hallucinations  Other reaction(s): Confusion  . Pseudoeph-Hydrocodone-Gg Nausea Only  . Sulfa Antibiotics Anaphylaxis and Nausea Only  . Varenicline Tartrate     Other reaction(s): Confusion  . Ambien [Zolpidem] Other (See Comments)    Causes sleep walking  . Codeine Nausea And Vomiting  . Hydrocodone Nausea And Vomiting  . Paxil [Paroxetine Hcl] Other (See Comments)    Causes ringing in the ears.   . Sulfur Other (See Comments)    Swelling of tongue  . Amoxicillin Rash    Has patient had a PCN reaction causing immediate rash, facial/tongue/throat swelling, SOB or lightheadedness with hypotension: No Has patient had a PCN reaction causing severe  rash involving mucus membranes or skin necrosis: No Has patient had a PCN reaction that required hospitalization: No Has patient had a PCN reaction occurring within the last 10 years: Yes If all of the above answers are "NO", then may proceed with Cephalosporin use.   . Tape Rash    Review of Systems  Constitutional: Negative.   HENT: Negative.   Eyes: Negative.   Respiratory: Negative.   Cardiovascular: Negative.   Endocrine: Positive for polydipsia and polyuria.  Genitourinary: Positive for difficulty urinating.  Skin:       Clammy skin  Neurological: Positive for weakness.  Psychiatric/Behavioral: The patient is not nervous/anxious.        Objective:    Physical Exam Constitutional:      Appearance: She is ill-appearing.  HENT:     Head: Normocephalic.  Eyes:     Conjunctiva/sclera: Conjunctivae normal.  Cardiovascular:     Rate and Rhythm: Normal rate and regular rhythm.     Pulses: Normal pulses.     Heart sounds: Normal heart sounds.  Pulmonary:     Effort: Pulmonary effort is normal.     Breath sounds: Normal breath sounds.  Abdominal:     General: Bowel sounds are normal.  Skin:    Comments: clammy  Neurological:     Mental Status: She is alert and oriented to person, place, and time.     Motor: Weakness present.     Coordination: Coordination abnormal.     BP [name])  95/59   Pulse 80   Temp (!) 97.2 F (36.2 C) (Temporal)   Ht 5\' 9"  (1.753 m)   Wt 169 lb 3.2 oz (76.7 kg)   BMI 24.99 kg/m  Wt Readings from Last 3 Encounters:  12/17/19 169 lb 3.2 oz (76.7 kg)  12/08/19 169 lb 6.4 oz (76.8 kg)  12/02/19 171 lb 3.2 oz (77.7 kg)    Health Maintenance Due  Topic Date Due  . PNEUMOCOCCAL POLYSACCHARIDE VACCINE AGE 28-64 HIGH RISK  Never done  . FOOT EXAM  Never done  . OPHTHALMOLOGY EXAM  Never done  . TETANUS/TDAP  Never done  . COLONOSCOPY  Never done      Lab Results  Component Value Date   TSH 0.684 04/17/2016   Lab Results  Component  Value Date   WBC 7.8 12/02/2019   HGB 15.2 12/02/2019   HCT 43.6 12/02/2019   MCV 93 12/02/2019   PLT 214 12/02/2019   Lab Results  Component Value Date   NA 140 12/02/2019   K 4.9 12/02/2019   CO2 24 12/02/2019   GLUCOSE 134 (H) 12/02/2019   BUN 18 12/02/2019   CREATININE 0.70 12/02/2019   BILITOT 0.3 12/02/2019   ALKPHOS 84 12/02/2019   AST 16 12/02/2019   ALT 10 12/02/2019   PROT 6.3 12/02/2019   ALBUMIN 4.4 12/02/2019   CALCIUM 10.1 12/02/2019   ANIONGAP 8 04/22/2018   Lab Results  Component Value Date   CHOL 147 12/02/2019   Lab Results  Component Value Date   HDL 45 12/02/2019   Lab Results  Component Value Date   LDLCALC 67 12/02/2019   Lab Results  Component Value Date   TRIG 217 (H) 12/02/2019   Lab Results  Component Value Date   CHOLHDL 3.3 12/02/2019   Lab Results  Component Value Date   HGBA1C 5.9 12/02/2019       Assessment & Plan:   Problem List Items Addressed This Visit      Cardiovascular and Mediastinum   Hypertension     Endocrine   Diabetes mellitus ([place])    Patient is a 64 year old female presents with history of diabetes mellitus without complications, diagnosed in 2020.  Compliance with treatment has been poor.  The patient is not taking medication as directed she tries to maintain a diabetic diet, and follow-up as directed. Patient is not keeping a glucose diary.  Today sugar is 48 in clinic.  Associated symptoms include; blurry vision, fatigue, polydipsia, polyuria and weakness, Provided education to patient on signs and symptoms of hypoglycemia, progressive diabetes mellitus, checking blood sugars, Follow-up with unresolved or worsening symptoms.      Hypoglycemia - Primary    Patient is a 64 year old female who presents to clinic today with symptoms of hypoglycemia reporting excessive sleepiness, weakness and double vision.  Patient reports the symptoms have been present in the last 2 weeks and getting worse.  Patient  reports decreased appetite, patient started drinking daily Ensure to improve appetite about a week ago.  No signs and symptoms of nausea, vomiting, fever and chills. Patient's glucose level checked in clinic, 48 Patient given peanut butter crackers and Sprite Provided education to patient on signs and symptoms of hypoglycemia and how to check blood sugar levels at home. Patient knows to follow-up with worsening or unresolved symptoms.        Other Visit Diagnoses    Blurred vision, bilateral       Relevant Orders   Glucose Hemocue  Waived (Completed)       No orders of the defined types were placed in this encounter.    Ivy Lynn, NP

## 2019-12-21 ENCOUNTER — Ambulatory Visit: Payer: BC Managed Care – PPO | Admitting: *Deleted

## 2019-12-21 DIAGNOSIS — Z794 Long term (current) use of insulin: Secondary | ICD-10-CM

## 2019-12-21 DIAGNOSIS — E0801 Diabetes mellitus due to underlying condition with hyperosmolarity with coma: Secondary | ICD-10-CM
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
## 2019-12-22 ENCOUNTER — Telehealth: Payer: Self-pay | Admitting: Family Medicine

## 2019-12-22 DIAGNOSIS — Z794 Long term (current) use of insulin: Secondary | ICD-10-CM

## 2019-12-22 DIAGNOSIS — E0801 Diabetes mellitus due to underlying condition with hyperosmolarity with coma: Secondary | ICD-10-CM

## 2019-12-22 DIAGNOSIS — H532 Diplopia: Secondary | ICD-10-CM | POA: Diagnosis not present

## 2019-12-22 NOTE — Telephone Encounter (Signed)
-----   Message from Ilean China, RN sent at 12/21/2019  9:53 AM EDT ----- Regarding: CCM Good morning!  Would you consider putting in referral to our CCM team for this patient? Tye Maryland was concerned after her visit with Je on Friday and left a message for me to see if we could help. I've looked over her chart and I think she's a good candidate.   Let me know if you have any questions or problems.   Thank you! Cyril Mourning

## 2019-12-22 NOTE — Telephone Encounter (Signed)
Referral placed.

## 2019-12-23 ENCOUNTER — Telehealth: Payer: Self-pay | Admitting: Family Medicine

## 2019-12-23 DIAGNOSIS — M5136 Other intervertebral disc degeneration, lumbar region: Secondary | ICD-10-CM | POA: Diagnosis not present

## 2019-12-23 DIAGNOSIS — M5030 Other cervical disc degeneration, unspecified cervical region: Secondary | ICD-10-CM | POA: Diagnosis not present

## 2019-12-23 NOTE — Chronic Care Management (AMB) (Signed)
   Care Management   Outreach Note  12/23/2019 Name: Cynthia Deleon MRN: [id number] DOB: [birth year]  Cynthia Deleon is a 64 y.o. year old female who is a primary care patient of Cynthia Brooklyn, FNP. I reached out to Cynthia Deleon by phone today in response to a referral sent by Cynthia Deleon, Cynthia Brooklyn, FNP.     An unsuccessful telephone outreach was attempted today. The patient was referred to the case management team for assistance with care management and care coordination.   Follow Up Plan: A HIPPA compliant phone message was left for the patient providing contact information and requesting a return call. The care management team will reach out to the patient again over the next 7 days. If patient returns call to provider office, please advise to call [facility] at [phone].  [place], [place] [address] Direct Dial: [phone] [email or web address]

## 2019-12-24 NOTE — Chronic Care Management (AMB) (Signed)
  Care Management   Note  12/24/2019 Name: Cynthia Deleon MRN: [id number] DOB: December 12, 1955  Cynthia Deleon is a 64 y.o. year old female who is a primary care patient of Loman Brooklyn, FNP. I reached out to Jodell Cipro by phone today in response to a referral sent by [name]'s health plan.    [name] was given information about care management services today including:  1. Care management services include personalized support from designated clinical staff supervised by her physician, including individualized plan of care and coordination with other care providers 2. 24/7 contact phone numbers for assistance for urgent and routine care needs. 3. The patient may stop care management services at any time by phone call to the office staff.  Patient agreed to services and verbal consent obtained.   Follow up plan: Telephone appointment with care management team member scheduled for: 01/07/2020  [facility]  [place], [place] [address] Direct Dial: [email or web address] Website: [email or web address]

## 2020-01-04 ENCOUNTER — Other Ambulatory Visit: Payer: Self-pay | Admitting: Family Medicine

## 2020-01-04 DIAGNOSIS — E11649 Type 2 diabetes mellitus with hypoglycemia without coma: Secondary | ICD-10-CM

## 2020-01-07 ENCOUNTER — Telehealth: Payer: Self-pay | Admitting: *Deleted

## 2020-01-07 ENCOUNTER — Ambulatory Visit: Payer: BC Managed Care – PPO | Admitting: *Deleted

## 2020-01-07 DIAGNOSIS — I10 Essential (primary) hypertension: Secondary | ICD-10-CM

## 2020-01-07 DIAGNOSIS — E11649 Type 2 diabetes mellitus with hypoglycemia without coma: Secondary | ICD-10-CM

## 2020-01-07 DIAGNOSIS — F3164 Bipolar disorder, current episode mixed, severe, with psychotic features: Secondary | ICD-10-CM

## 2020-01-07 DIAGNOSIS — H538 Other visual disturbances: Secondary | ICD-10-CM

## 2020-01-07 NOTE — Patient Instructions (Signed)
Visit Information  Goals Addressed              This Visit's Progress     Patient Stated   .  "I don't want to keep getting bladder infections" (pt-stated)        CARE PLAN ENTRY (see longitudinal plan of care for additional care plan information)  Current Barriers:  . Care Coordination needs related to frequent UTIs in a patient with diabetes and bipolar disorder (disease states)  Nurse Case Manager Clinical Goal(s):  [name] Over the next 30 days, patient will work with PCP regarding frequent UTIs  Interventions:  . Inter-disciplinary care team collaboration (see longitudinal plan of care) . Chart reviewed, including recent office notes and lab results . Discussed frequent UTIs per patient's report o No current symptoms o Has had hematuria and cloudy urine in the past . Reviewed and discussed medications o Kelfex recently prescribed . Discussed proper hygiene to prevent UTIs . Discussed aging and anatomical changes that increase risk for UTIs . Encouraged to increase water intake . Encouraged to reach out to PCP when she suspects she has a UTI . Discussed possibility of urology referral to address hematuria and frequent UTIs . RN to collaborate with PCP regarding referral  Patient Self Care Activities:  . Performs ADL's independently . Calls provider office for new concerns or questions . Unable to drive presently  Initial goal documentation     .  "I want to find out why I'm having double vision while driving" (pt-stated)        [place] (see longitudinal plan of care for additional care plan information)  Current Barriers:  . Care Coordination needs related to vision changes in a patient with diabetes (disease states)  Nurse Case Manager Clinical Goal(s):  [name] Over the next 10 days, patient will work with PCP office regarding testing needed for double vision . Over the next 15 days, patient will talk with RN Care Manager regarding vision changes and necessary  testing  Interventions:  . Inter-disciplinary care team collaboration (see longitudinal plan of care) . Chart reviewed including recent office notes . Collaborated with PCP and clinical staff regarding most recent visit . Discussed hypoglycemic episode at most recent visit . Discussed recent eye exam with Dr Jorja Loa at [facility] in [place] o Per patient, optometrist recommended "head scan" to evaluate possible causes of double vision because eye exam was normal . Discussed episodes of double vision o Only happen when she is driving . Cautioned to avoid driving until this has been further investigated and resolves . RN will collaborate with PCP regarding office note from optometrist and see if they will order further testing. Head CT? [name] Provided with RN Care manager contact number and encouraged to reach out as needed  Patient Self Care Activities:  . Performs ADL's independently . Unable to drive presently  Initial goal documentation     .  "I want to keep my blood sugar under control" (pt-stated)        CARE PLAN ENTRY (see longitudinal plan of care for additional care plan information)  Current Barriers:  . Chronic Disease Management support and education needs related to diabetes  Nurse Case Manager Clinical Goal(s):  [name] Over the next 15 days, patient will talk with RN Care Manager regarding diabetes management . Over the next 30 days, patient will have visit with [facility] Clinical pharmacist to discuss medications and diabetes management  Interventions:  . Inter-disciplinary care team collaboration (  see longitudinal plan of care) . Chart reviewed including recent office notes and lab results . Discussed recent office visit with patient and hpoglycemic episode with blood sugar of 48 . Discussed normal A1C result . Discussed home blood sugar readings o She has had some lows below 70 but typically runs between 70 and 200 o Reports normal readings in the morning, highs in the  afternoon, and then some lows overnight o Now testing 3 to 4 times a day since last office visit . Discussed normal A1C result . Discussed that A1C is an average and can look "normal" if she's running high and very low . Discussed professional CGM that she can wear for 2 weeks to monitor her blood sugar trends. She is interested. Nash Dimmer with [facility] clinical staff regarding placement of CGM . Collaborated with [facility] clinical pharmacist, Lottie Dawson, [facility] o She offered to see patient to discuss meds, CGM, and DM management o Will discuss further with patient and have front office schedule visits . Discussed diet o Will mail handout on diabetic diet and info on low glycemic foods o Encouraged patient to to avoid simple carbs and to eat a lean protein when eating carbohydrates . Reviewed and discussed medications: glucatrol and metformin o Glucatrol recently decreased to 5mg  each morning o Patient thinks metformin was d/c but it remains on her list - Clinical pharmacist to address at visit . Discussed concerns about protecting her kidneys o Advised that blood sugar and blood pressure control are important . Follow-up telephone call with RNCM scheduled for 01/12/20 at 1:00 . Provided with RNCM contact number and encouraged to reach out as needed . Encouraged to keep all medical appts . Encouraged to continue checking blood sugar at least twice daily and to report to PCP any readings outside of recommended range  Patient Self Care Activities:  . Performs ADL's independently . Calls provider office for new concerns or questions . Unable to independently drive presently  Initial goal documentation        Patient verbalizes understanding of instructions provided today.   Follow-up Plan Telephone follow up appointment with care management team member scheduled for:01/12/20 RN Care Manager  [name], BSN, RN-BC [place] [facility] Direct Dial: [phone]

## 2020-01-07 NOTE — Chronic Care Management (AMB) (Signed)
Care Management   Initial Visit Note  01/07/2020 Name: Cynthia Deleon MRN: [id number] DOB: 11/25/1955  Referred by: Cynthia Brooklyn, FNP Reason for referral : Chronic Care Management (Initial Visit )   Cynthia Deleon is a 64 y.o. year old female who is a primary care patient of Cynthia Brooklyn, FNP. The CCM team was consulted for assistance with chronic disease management and care coordination needs related to diabetes, HTN, OCD, bipolar disorder.  Review of patient status, including review of consultants reports, relevant laboratory and other test results, and collaboration with appropriate care team members and the patient's provider was performed as part of comprehensive patient evaluation and provision of chronic care management services.    Subjective: I spoke with Cynthia Deleon by telephone today regarding management of her chronic medical conditions. She has concerns about diabetes management and hypoglycemia, frequent UTIs, and double vision.   SDOH (Social Determinants of Health) assessments performed: Yes See Care Plan activities for detailed interventions related to SDOH  SDOH Interventions     Most Recent Value  SDOH Interventions  Transportation Interventions Other (Comment)  [Unable to drive right now due to episodes of double vision. Discussed friends/family as options for driving to appointments. Can use [facility] for [place] appts if necessary.]       Objective: Outpatient Encounter Medications as of 01/07/2020  Medication Sig  . albuterol (VENTOLIN HFA) 108 (90 Base) MCG/ACT inhaler Inhale 2 puffs into the lungs every 6 (six) hours as needed for wheezing or shortness of breath.  [name] atorvastatin (LIPITOR) 40 MG tablet TAKE ONE TABLET BY MOUTH ONCE DAILY.  . cephALEXin (KEFLEX) 500 MG capsule Take 1 capsule (500 mg total) by mouth 2 (two) times daily.  . cyclobenzaprine (FLEXERIL) 10 MG tablet Take 10 mg by mouth 3 (three) times daily as needed.  [name] DEXILANT 60  MG capsule Take 1 capsule by mouth daily.  . diazepam (VALIUM) 5 MG tablet Take 1 tablet (5 mg total) by mouth every 8 (eight) hours as needed (vertigo). (Patient taking differently: Take 5 mg by mouth in the morning and at bedtime. )  . estrogens, conjugated, (PREMARIN) 0.3 MG tablet Take 1 tablet (0.3 mg total) by mouth daily.  . fluticasone (FLONASE) 50 MCG/ACT nasal spray Place 2 sprays into both nostrils daily.  [name] glipiZIDE (GLUCOTROL) 5 MG tablet Take 1 tablet (5 mg total) by mouth daily before breakfast.  . glucose blood (CONTOUR NEXT TEST) test strip Test BS 4 times daily Dx E11.9  . lamoTRIgine (LAMICTAL) 200 MG tablet Take 200 mg by mouth at bedtime.   [name] levocetirizine (XYZAL) 5 MG tablet Take 5 mg by mouth every evening.  . metFORMIN (GLUCOPHAGE) 500 MG tablet Take 500 mg by mouth 2 (two) times daily with a meal.  . Microlet Lancets MISC by Does not apply route.  [name] oxyCODONE (ROXICODONE) 15 MG immediate release tablet Take 7.5 mg by mouth every 6 (six) hours as needed.   . pregabalin (LYRICA) 150 MG capsule Take 150 mg by mouth 2 (two) times daily.  . sertraline (ZOLOFT) 100 MG tablet Take 100 mg by mouth daily.   . Suvorexant (BELSOMRA) 20 MG TABS Take 1 tablet by mouth at bedtime.  . triamcinolone ointment (KENALOG) 0.5 % Apply 1 application topically 2 (two) times daily.  . valACYclovir (VALTREX) 500 MG tablet Take 500 mg by mouth daily as needed (shingles flare up).    No facility-administered encounter medications on file as of 01/07/2020.  RN Care Plan   .  "I don't want to keep getting bladder infections" (pt-stated)        CARE PLAN ENTRY (see longitudinal plan of care for additional care plan information)  Current Barriers:  . Care Coordination needs related to frequent UTIs in a patient with diabetes and bipolar disorder (disease states)  Nurse Case Manager Clinical Goal(s):  [name] Over the next 30 days, patient will work with PCP regarding frequent  UTIs  Interventions:  . Inter-disciplinary care team collaboration (see longitudinal plan of care) . Chart reviewed, including recent office notes and lab results . Discussed frequent UTIs per patient's report o No current symptoms o Has had hematuria and cloudy urine in the past . Reviewed and discussed medications o Kelfex recently prescribed . Discussed proper hygiene to prevent UTIs . Discussed aging and anatomical changes that increase risk for UTIs . Encouraged to increase water intake . Encouraged to reach out to PCP when she suspects she has a UTI . Discussed possibility of urology referral to address hematuria and frequent UTIs . RN to collaborate with PCP regarding referral  Patient Self Care Activities:  . Performs ADL's independently . Calls provider office for new concerns or questions . Unable to drive presently  Initial goal documentation     .  "I want to find out why I'm having double vision while driving" (pt-stated)        [place] (see longitudinal plan of care for additional care plan information)  Current Barriers:  . Care Coordination needs related to vision changes in a patient with diabetes (disease states)  Nurse Case Manager Clinical Goal(s):  [name] Over the next 10 days, patient will work with PCP office regarding testing needed for double vision . Over the next 15 days, patient will talk with RN Care Manager regarding vision changes and necessary testing  Interventions:  . Inter-disciplinary care team collaboration (see longitudinal plan of care) . Chart reviewed including recent office notes . Collaborated with PCP and clinical staff regarding most recent visit . Discussed hypoglycemic episode at most recent visit . Discussed recent eye exam with Cynthia Deleon at [facility] in [place] o Per patient, optometrist recommended "head scan" to evaluate possible causes of double vision because eye exam was normal . Discussed episodes of double  vision o Only happen when she is driving . Cautioned to avoid driving until this has been further investigated and resolves . RN will collaborate with PCP regarding office note from optometrist and see if they will order further testing. Head CT? [name] Provided with RN Care manager contact number and encouraged to reach out as needed  Patient Self Care Activities:  . Performs ADL's independently . Unable to drive presently  Initial goal documentation     .  "I want to keep my blood sugar under control" (pt-stated)        CARE PLAN ENTRY (see longitudinal plan of care for additional care plan information)  Current Barriers:  . Chronic Disease Management support and education needs related to diabetes  Nurse Case Manager Clinical Goal(s):  [name] Over the next 15 days, patient will talk with RN Care Manager regarding diabetes management . Over the next 30 days, patient will have visit with [facility] Clinical pharmacist to discuss medications and diabetes management  Interventions:  . Inter-disciplinary care team collaboration (see longitudinal plan of care) . Chart reviewed including recent office notes and lab results . Discussed recent office visit with patient and  hpoglycemic episode with blood sugar of 48 . Discussed normal A1C result . Discussed home blood sugar readings o She has had some lows below 70 but typically runs between 70 and 200 o Reports normal readings in the morning, highs in the afternoon, and then some lows overnight o Now testing 3 to 4 times a day since last office visit . Discussed normal A1C result . Discussed that A1C is an average and can look "normal" if she's running high and very low . Discussed professional CGM that she can wear for 2 weeks to monitor her blood sugar trends. She is interested. Nash Dimmer with [facility] clinical staff regarding placement of CGM . Collaborated with [facility] clinical pharmacist, Lottie Dawson, [facility] o She offered to see patient to discuss  meds, CGM, and DM management o Will discuss further with patient and have front office schedule visits . Discussed diet o Will mail handout on diabetic diet and info on low glycemic foods o Encouraged patient to to avoid simple carbs and to eat a lean protein when eating carbohydrates . Reviewed and discussed medications: glucatrol and metformin o Glucatrol recently decreased to 5mg  each morning o Patient thinks metformin was d/c but it remains on her list - Clinical pharmacist to address at visit . Discussed concerns about protecting her kidneys o Advised that blood sugar and blood pressure control are important . Follow-up telephone call with RNCM scheduled for 01/12/20 at 1:00 . Provided with RNCM contact number and encouraged to reach out as needed . Encouraged to keep all medical appts . Encouraged to continue checking blood sugar at least twice daily and to report to PCP any readings outside of recommended range  Patient Self Care Activities:  . Performs ADL's independently . Calls provider office for new concerns or questions . Unable to independently drive presently  Initial goal documentation         Plan:   Telephone follow up appointment with care management team member scheduled for: RN Care Manager 01/12/20 at 1:00  [name], BSN, RN-BC [place] / [place] Management Direct Dial: [phone]

## 2020-01-07 NOTE — Telephone Encounter (Signed)
  Chronic Care Management   Initial Visit Outreach Note  01/07/2020 Name: Cynthia Deleon MRN: [id number] DOB: 03/15/1956  Referred by: Loman Brooklyn, FNP Reason for referral : Chronic Care Management (Initial Visit Outreach)   An unsuccessful Initial Telephone Visit was attempted today. The patient was referred to the case management team for assistance with care management and care coordination. The patient's telephone number is not is service. I was able to talk with her emergency contact but he could not provide me with an updated telephone number. He is able to provide Cynthia Deleon with my phone number and will ask her to return my call.   Follow Up Plan:  Waiting on patient to return my call CCM team will reach out again over the next 10 days if no return call is received  [name], BSN, RN-BC [place] / [place] Management Direct Dial: [phone]

## 2020-01-09 DIAGNOSIS — J449 Chronic obstructive pulmonary disease, unspecified: Secondary | ICD-10-CM | POA: Diagnosis not present

## 2020-01-10 ENCOUNTER — Ambulatory Visit: Payer: BC Managed Care – PPO | Admitting: *Deleted

## 2020-01-10 DIAGNOSIS — I10 Essential (primary) hypertension: Secondary | ICD-10-CM

## 2020-01-10 DIAGNOSIS — E11649 Type 2 diabetes mellitus with hypoglycemia without coma: Secondary | ICD-10-CM

## 2020-01-10 DIAGNOSIS — H538 Other visual disturbances: Secondary | ICD-10-CM

## 2020-01-10 NOTE — Patient Instructions (Signed)
Visit Information  Goals Addressed              This Visit's Progress     Patient Stated   .  "I want to find out why I'm having double vision while driving" (pt-stated)        Cynthia Deleon (see longitudinal plan of care for additional care plan information)  Current Barriers:  . Care Coordination needs related to vision changes in a patient with diabetes (disease states)  Nurse Case Manager Clinical Goal(s):  Cynthia Deleon Kitchen Over the next 10 days, patient will work with PCP office regarding testing needed for double vision . Over the next 15 days, patient will talk with RN Care Manager regarding vision changes and necessary testing  Interventions:  . Inter-disciplinary care team collaboration (see longitudinal plan of care) . Chart reviewed including recent office notes . Discussed hypoglycemic episode at most recent visit . Previously discussed recent eye exam with Dr Jorja Loa at [facility] in [place] . Reviewed report from Dr Jorja Loa. It is on PCP's desk for review when she is back in the office tomorrow.  Cynthia Deleon message to PCP regarding Dr Vella Redhead recommendation for neuro evaluation and that patient prefers Dr Marcial Pacas with [facility] if a referral is appropriate  . Discussed episodes of double vision o Only happened when she was driving o No events since last telephone call but patient has been avoiding driving . Cautioned to avoid driving until this has been further investigated and resolves . Provided with RN Care manager contact number and encouraged to reach out as needed  Patient Self Care Activities:  . Performs ADL's independently . Unable to drive presently  Please see past updates related to this goal by clicking on the "Past Updates" button in the selected goal      .  "I want to keep my blood sugar under control" (pt-stated)        CARE PLAN ENTRY (see longitudinal plan of care for additional care plan information)  Current Barriers:  . Chronic Disease  Management support and education needs related to diabetes  Nurse Case Manager Clinical Goal(s):  Cynthia Deleon Kitchen Over the next 15 days, patient will talk with RN Care Manager regarding diabetes management . Over the next 30 days, patient will have visit with [facility] Clinical pharmacist to discuss medications and diabetes management  Interventions:  . Inter-disciplinary care team collaboration (see longitudinal plan of care) . Chart reviewed including recent office notes and lab results . Previously discussed recent office visit with patient and hpoglycemic episode with blood sugar of 48 . Previously discussed normal A1C result . Previously discussed home blood sugar readings . Collaborated with [facility] clinical staff regarding placement of CGM . Collaborated with [facility] clinical pharmacist, Cynthia Deleon, [facility] . Collaborated with front office staff to schedule visit with Cynthia Deleon, [facility] o Asked patient to bring glucometer and blood sugar log to appt . Discussed diet o Printed and mailed educational materials nutrition and diabetes and carb counting . Previously reviewed and discussed medications: glucatrol and metformin . Message sent to PCP regarding patient's need for diabetes nutrition education o She has seen Jearld Fenton, Registered Dietician, in the past and may benefit from another referral . Provided with RNCM contact number and encouraged to reach out as needed . Encouraged to keep all medical appts . Encouraged to continue checking blood sugar at least twice daily and to report to PCP any readings outside of recommended range  Patient Self Care  Activities:  . Performs ADL's independently . Calls provider office for new concerns or questions . Unable to independently drive presently  Please see past updates related to this goal by clicking on the "Past Updates" button in the selected goal         Patient verbalizes understanding of instructions provided today.   Follow-up Plan The care  management team will reach out to the patient again over the next 30 days.   Cynthia Deleon, [facility] 01/13/20  Cynthia Deleon, BSN, RN-BC Embedded Chronic Care Manager [facility] / [place] [facility] Direct Dial: [phone]

## 2020-01-10 NOTE — Chronic Care Management (AMB) (Signed)
Chronic Care Management   Follow Up Note   01/10/2020 Name: Cynthia Deleon MRN: [id number] DOB: 1956/05/20  Referred by: Loman Brooklyn, FNP Reason for referral : Chronic Care Management (RN Follow up)   Cynthia Deleon is a 64 y.o. year old female who is a primary care patient of Loman Brooklyn, FNP. The CCM team was consulted for assistance with chronic disease management and care coordination needs.    Review of patient status, including review of consultants reports, relevant laboratory and other test results, and collaboration with appropriate care team members and the patient's provider was performed as part of comprehensive patient evaluation and provision of chronic care management services.    SDOH (Social Determinants of Health) assessments performed: Yes-transportation See Care Plan activities for detailed interventions related to [facility])     Outpatient Encounter Medications as of 01/10/2020  Medication Sig  . albuterol (VENTOLIN HFA) 108 (90 Base) MCG/ACT inhaler Inhale 2 puffs into the lungs every 6 (six) hours as needed for wheezing or shortness of breath.  [name] atorvastatin (LIPITOR) 40 MG tablet TAKE ONE TABLET BY MOUTH ONCE DAILY.  . cephALEXin (KEFLEX) 500 MG capsule Take 1 capsule (500 mg total) by mouth 2 (two) times daily.  . cyclobenzaprine (FLEXERIL) 10 MG tablet Take 10 mg by mouth 3 (three) times daily as needed.  [name] DEXILANT 60 MG capsule Take 1 capsule by mouth daily.  . diazepam (VALIUM) 5 MG tablet Take 1 tablet (5 mg total) by mouth every 8 (eight) hours as needed (vertigo). (Patient taking differently: Take 5 mg by mouth in the morning and at bedtime. )  . estrogens, conjugated, (PREMARIN) 0.3 MG tablet Take 1 tablet (0.3 mg total) by mouth daily.  . fluticasone (FLONASE) 50 MCG/ACT nasal spray Place 2 sprays into both nostrils daily.  [name] glipiZIDE (GLUCOTROL) 5 MG tablet Take 1 tablet (5 mg total) by mouth daily before breakfast.  . glucose blood (CONTOUR  NEXT TEST) test strip Test BS 4 times daily Dx E11.9  . lamoTRIgine (LAMICTAL) 200 MG tablet Take 200 mg by mouth at bedtime.   [name] levocetirizine (XYZAL) 5 MG tablet Take 5 mg by mouth every evening.  . metFORMIN (GLUCOPHAGE) 500 MG tablet Take 500 mg by mouth 2 (two) times daily with a meal.  . Microlet Lancets MISC by Does not apply route.  [name] oxyCODONE (ROXICODONE) 15 MG immediate release tablet Take 7.5 mg by mouth every 6 (six) hours as needed.   . pregabalin (LYRICA) 150 MG capsule Take 150 mg by mouth 2 (two) times daily.  . sertraline (ZOLOFT) 100 MG tablet Take 100 mg by mouth daily.   . Suvorexant (BELSOMRA) 20 MG TABS Take 1 tablet by mouth at bedtime.  . triamcinolone ointment (KENALOG) 0.5 % Apply 1 application topically 2 (two) times daily.  . valACYclovir (VALTREX) 500 MG tablet Take 500 mg by mouth daily as needed (shingles flare up).    No facility-administered encounter medications on file as of 01/10/2020.      RN Care Plan   .  "I want to find out why I'm having double vision while driving" (pt-stated)        [place] (see longitudinal plan of care for additional care plan information)  Current Barriers:  . Care Coordination needs related to vision changes in a patient with diabetes (disease states)  Nurse Case Manager Clinical Goal(s):  [name] Over the next 10 days, patient will work with PCP office regarding testing  needed for double vision . Over the next 15 days, patient will talk with RN Care Manager regarding vision changes and necessary testing  Interventions:  . Inter-disciplinary care team collaboration (see longitudinal plan of care) . Chart reviewed including recent office notes . Discussed hypoglycemic episode at most recent visit . Previously discussed recent eye exam with Dr Jorja Loa at [facility] in [place] . Reviewed report from Dr Jorja Loa. It is on PCP's desk for review when she is back in the office tomorrow.  Cynthia Deleon message to PCP regarding Dr  Vella Redhead recommendation for neuro evaluation and that patient prefers Dr Marcial Pacas with [facility] if a referral is appropriate  . Discussed episodes of double vision o Only happened when she was driving o No events since last telephone call but patient has been avoiding driving . Cautioned to avoid driving until this has been further investigated and resolves . Provided with RN Care manager contact number and encouraged to reach out as needed  Patient Self Care Activities:  . Performs ADL's independently . Unable to drive presently  Please see past updates related to this goal by clicking on the "Past Updates" button in the selected goal      .  "I want to keep my blood sugar under control" (pt-stated)        CARE PLAN ENTRY (see longitudinal plan of care for additional care plan information)  Current Barriers:  . Chronic Disease Management support and education needs related to diabetes  Nurse Case Manager Clinical Goal(s):  [name] Over the next 15 days, patient will talk with RN Care Manager regarding diabetes management . Over the next 30 days, patient will have visit with [facility] Clinical pharmacist to discuss medications and diabetes management  Interventions:  . Inter-disciplinary care team collaboration (see longitudinal plan of care) . Chart reviewed including recent office notes and lab results . Previously discussed recent office visit with patient and hpoglycemic episode with blood sugar of 48 . Previously discussed normal A1C result . Previously discussed home blood sugar readings . Collaborated with [facility] clinical staff regarding placement of CGM . Collaborated with [facility] clinical pharmacist, Lottie Dawson, [facility] . Collaborated with front office staff to schedule visit with Lottie Dawson, [facility] o Asked patient to bring glucometer and blood sugar log to appt . Discussed diet o Printed and mailed educational materials nutrition and diabetes and carb counting . Previously reviewed  and discussed medications: glucatrol and metformin . Message sent to PCP regarding patient's need for diabetes nutrition education o She has seen Jearld Fenton, Registered Dietician, in the past and may benefit from another referral . Provided with RNCM contact number and encouraged to reach out as needed . Encouraged to keep all medical appts . Encouraged to continue checking blood sugar at least twice daily and to report to PCP any readings outside of recommended range  Patient Self Care Activities:  . Performs ADL's independently . Calls provider office for new concerns or questions . Unable to independently drive presently  Please see past updates related to this goal by clicking on the "Past Updates" button in the selected goal          Plan:   The care management team will reach out to the patient again over the next 30 days.   Lottie Dawson, [facility] 01/13/20     [name], BSN, RN-BC Embedded Chronic Care Manager [facility] / [place] [facility] Direct Dial: [phone]

## 2020-01-11 ENCOUNTER — Telehealth: Payer: Self-pay | Admitting: Family Medicine

## 2020-01-11 ENCOUNTER — Telehealth: Payer: BC Managed Care – PPO

## 2020-01-11 DIAGNOSIS — H532 Diplopia: Secondary | ICD-10-CM

## 2020-01-11 NOTE — Telephone Encounter (Signed)
Patient had eye exam on 12/22/2019 at which time Madelin Headings, OD recommended a neuro eval for diplopia. She's requesting Marcial Pacas, MD at [facility]. Referral placed today.

## 2020-01-12 DIAGNOSIS — F310 Bipolar disorder, current episode hypomanic: Secondary | ICD-10-CM | POA: Diagnosis not present

## 2020-01-13 ENCOUNTER — Ambulatory Visit (INDEPENDENT_AMBULATORY_CARE_PROVIDER_SITE_OTHER): Payer: BC Managed Care – PPO | Admitting: Nurse Practitioner

## 2020-01-13 ENCOUNTER — Ambulatory Visit: Payer: BC Managed Care – PPO | Admitting: Family Medicine

## 2020-01-13 ENCOUNTER — Other Ambulatory Visit: Payer: Self-pay

## 2020-01-13 ENCOUNTER — Encounter: Payer: Self-pay | Admitting: Nurse Practitioner

## 2020-01-13 ENCOUNTER — Ambulatory Visit: Payer: BC Managed Care – PPO | Admitting: Pharmacist

## 2020-01-13 VITALS — BP 140/76 | HR 96 | Temp 97.1°F | Resp 20 | Ht 69.0 in | Wt 172.0 lb

## 2020-01-13 DIAGNOSIS — H9202 Otalgia, left ear: Secondary | ICD-10-CM | POA: Diagnosis not present

## 2020-01-13 DIAGNOSIS — R300 Dysuria: Secondary | ICD-10-CM

## 2020-01-13 DIAGNOSIS — N3000 Acute cystitis without hematuria: Secondary | ICD-10-CM

## 2020-01-13 MED ORDER — FLUCONAZOLE 150 MG PO TABS
ORAL_TABLET | ORAL | 0 refills | Status: DC
Start: 2020-01-13 — End: 2020-03-07

## 2020-01-13 MED ORDER — DOXYCYCLINE HYCLATE 100 MG PO TABS: 100.0000 mg | ORAL_TABLET | Freq: Two times a day (BID) | ORAL | 0 refills | Status: DC

## 2020-01-13 NOTE — Patient Instructions (Signed)

## 2020-01-13 NOTE — Addendum Note (Signed)
Addended by: Chevis Pretty on: 01/13/2020 10:57 AM   Modules accepted: Orders

## 2020-01-13 NOTE — Progress Notes (Signed)
Subjective:    [name], female    DOB: August 06, 1955, 64 y.o.   MRN: [id number]   Chief Complaint: Dysuria and Ear Pain (Left ear)   HPI Patient comes in today with 2 complaints: - c/o left ear pain. Started 3 days. Pain is constant. Rates pain 7/10. Denies any drainage or fever. - dysuria when she voids. She had to get up 3 times last night to void. She has had several in the past.   Review of Systems  Constitutional: Negative for diaphoresis.  Eyes: Negative for pain.  Respiratory: Negative for shortness of breath.   Cardiovascular: Negative for chest pain, palpitations and leg swelling.  Gastrointestinal: Negative for abdominal pain.  Endocrine: Negative for polydipsia.  Genitourinary: Positive for dysuria, flank pain, frequency and urgency. Negative for pelvic pain.  Skin: Negative for rash.  Neurological: Negative for dizziness, weakness and headaches.  Hematological: Does not bruise/bleed easily.  Psychiatric/Behavioral: Negative.   All other systems reviewed and are negative.      Objective:   Physical Exam Vitals and nursing note reviewed.  Constitutional:      General: She is not in acute distress.    Appearance: Normal appearance. She is well-developed.  HENT:     Right Ear: Tympanic membrane, ear canal and external ear normal. There is no impacted cerumen.     Left Ear: Tympanic membrane, ear canal and external ear normal. There is no impacted cerumen.  Neck:     Vascular: No carotid bruit or JVD.  Cardiovascular:     Rate and Rhythm: Normal rate and regular rhythm.     Heart sounds: Normal heart sounds.  Pulmonary:     Effort: Pulmonary effort is normal. No respiratory distress.     Breath sounds: Normal breath sounds. No wheezing or rales.  Chest:     Chest wall: No tenderness.  Abdominal:     General: Bowel sounds are normal. There is no distension or abdominal bruit.     Palpations: Abdomen is soft. There is no hepatomegaly,  splenomegaly, mass or pulsatile mass.     Tenderness: There is no abdominal tenderness.  Musculoskeletal:        General: Normal range of motion.     Cervical back: Normal range of motion and neck supple.  Lymphadenopathy:     Cervical: No cervical adenopathy.  Skin:    General: Skin is warm and dry.  Neurological:     Mental Status: She is alert and oriented to person, place, and time.     Deep Tendon Reflexes: Reflexes are normal and symmetric.  Psychiatric:        Behavior: Behavior normal.        Thought Content: Thought content normal.        Judgment: Judgment normal.    BP 140/76   Pulse 96   Temp (!) 97.1 F (36.2 C) (Temporal)   Resp 20   Ht 5\' 9"  (1.753 m)   Wt 172 lb (78 kg)   SpO2 94%   BMI 25.40 kg/m         Assessment & Plan:  Jodell Cipro in today with chief complaint of Dysuria and Ear Pain (Left ear)   1. Dysuria - Urinalysis, Complete  2. Acute cystitis without hematuria Take medication as prescribe Cotton underwear Take shower not bath Cranberry juice, yogurt Force fluids AZO over the counter X2 days Culture pending RTO prn  - doxycycline (VIBRA-TABS) 100 MG tablet; Take 1  tablet (100 mg total) by mouth 2 (two) times daily. 1 po bid  Dispense: 20 tablet; Refill: 0 - Urine Culture  3. Left ear pain Motrin OTC Avoid getting water in ear Doxycycline may help ear also.  Mary-Margaret Hassell Done, FNP     The above assessment and management plan was discussed with the patient. The patient verbalized understanding of and has agreed to the management plan. Patient is aware to call the clinic if symptoms persist or worsen. Patient is aware when to return to the clinic for a follow-up visit. Patient educated on when it is appropriate to go to the emergency department.   Mary-Margaret Hassell Done, FNP

## 2020-01-14 ENCOUNTER — Ambulatory Visit: Payer: BC Managed Care – PPO | Admitting: Pharmacist

## 2020-01-16 LAB — URINE CULTURE

## 2020-01-17 LAB — URINALYSIS, COMPLETE
Bilirubin, UA: NEGATIVE
Glucose, UA: NEGATIVE
Nitrite, UA: POSITIVE — AB
Specific Gravity, UA: >1.03 — ABNORMAL HIGH (ref 1.005–1.030)
Urobilinogen, Ur: 0.2 mg/dL (ref 0.2–1.0)
pH, UA: 6 (ref 5.0–7.5)

## 2020-01-17 LAB — MICROSCOPIC EXAMINATION

## 2020-01-18 ENCOUNTER — Encounter: Payer: Self-pay | Admitting: Family Medicine

## 2020-01-18 DIAGNOSIS — K219 Gastro-esophageal reflux disease without esophagitis: Secondary | ICD-10-CM | POA: Insufficient documentation

## 2020-01-19 DIAGNOSIS — Z79891 Long term (current) use of opiate analgesic: Secondary | ICD-10-CM | POA: Diagnosis not present

## 2020-01-19 DIAGNOSIS — J449 Chronic obstructive pulmonary disease, unspecified: Secondary | ICD-10-CM | POA: Diagnosis not present

## 2020-01-19 DIAGNOSIS — E1142 Type 2 diabetes mellitus with diabetic polyneuropathy: Secondary | ICD-10-CM | POA: Diagnosis not present

## 2020-01-19 DIAGNOSIS — G4733 Obstructive sleep apnea (adult) (pediatric): Secondary | ICD-10-CM | POA: Diagnosis not present

## 2020-01-19 DIAGNOSIS — G894 Chronic pain syndrome: Secondary | ICD-10-CM | POA: Diagnosis not present

## 2020-02-01 ENCOUNTER — Telehealth: Payer: Self-pay | Admitting: *Deleted

## 2020-02-01 ENCOUNTER — Telehealth: Payer: BC Managed Care – PPO | Admitting: *Deleted

## 2020-02-01 NOTE — Telephone Encounter (Signed)
  Care Management   Outreach Note  02/01/2020 Name: Cynthia Deleon MRN: [id number] DOB: 24-Jun-1955  Referred by: Loman Brooklyn, FNP Reason for referral : Chronic Care Management (RN follow up)   An unsuccessful telephone follow-up was attempted today. The patient was referred to the case management team for assistance with care management and care coordination.   Follow Up Plan: The care management team will reach out to the patient again over the next 30 days.   [name], BSN, RN-BC Embedded Chronic Care Manager [facility] [place] Management Direct Dial: [phone]

## 2020-02-03 ENCOUNTER — Other Ambulatory Visit: Payer: Self-pay | Admitting: Family Medicine

## 2020-02-03 DIAGNOSIS — J302 Other seasonal allergic rhinitis: Secondary | ICD-10-CM

## 2020-02-09 DIAGNOSIS — J449 Chronic obstructive pulmonary disease, unspecified: Secondary | ICD-10-CM | POA: Diagnosis not present

## 2020-02-11 ENCOUNTER — Ambulatory Visit: Payer: BC Managed Care – PPO | Admitting: *Deleted

## 2020-02-11 ENCOUNTER — Telehealth: Payer: Self-pay | Admitting: *Deleted

## 2020-02-11 DIAGNOSIS — E11649 Type 2 diabetes mellitus with hypoglycemia without coma: Secondary | ICD-10-CM

## 2020-02-11 NOTE — Patient Instructions (Signed)
Visit Information  Goals Addressed              This Visit's Progress     Patient Stated   .  "I want to keep my blood sugar under control" (pt-stated)        CARE PLAN ENTRY (see longitudinal plan of care for additional care plan information)  Current Barriers:  . Chronic Disease Management support and education needs related to diabetes  Nurse Case Manager Clinical Goal(s):  [name] Over the next 15 days, patient will talk with RN Care Manager regarding diabetes management . Over the next 30 days, patient will have visit with [facility] Clinical pharmacist to discuss medications and diabetes management  Interventions:  . Inter-disciplinary care team collaboration (see longitudinal plan of care) . Chart reviewed including recent office notes and lab results . Collaborated with PharmD regarding blood sugar management . Discussed home blood sugar readgins . Talked with patient about diet and meal planning . Collaborated with [facility] Clinical staff regarding professional freestyle libre sensor placement and rescheduling of appt with PharmD . Provided with RNCM contact number and encouraged to reach out as needed . Encouraged to keep all medical appts . Encouraged to continue checking blood sugar at least twice daily and to report to PCP any readings outside of recommended range  Patient Self Care Activities:  . Performs ADL's independently . Calls provider office for new concerns or questions . Unable to independently drive presently  Please see past updates related to this goal by clicking on the "Past Updates" button in the selected goal         Patient verbalizes understanding of instructions provided today.   Follow-up Plan The care management team will reach out to the patient again over the next 45 days.   [name], BSN, RN-BC Embedded Chronic Care Manager [facility] [place] Management Direct Dial: [phone]

## 2020-02-11 NOTE — Telephone Encounter (Signed)
02/11/2020  Patient requesting prescription for Anoro sent to [facility] in [place]. Has used this in the past as a maintenance inhaler and now only has albuterol.   Also needs to reschedule her visit for diabetes and professional Freestyle Libre sensor placement with Lottie Dawson, PharmD.   Forwarding to [facility] Clinical staff for review and action.   [name], BSN, RN-BC Embedded Chronic Care Manager [facility] [place] Management Direct Dial: [phone]

## 2020-02-11 NOTE — Chronic Care Management (AMB) (Signed)
Chronic Care Management   Follow Up Note   02/11/2020 Name: Cynthia Deleon MRN: [id number] DOB: Feb 21, 1956  Referred by: Loman Brooklyn, FNP Reason for referral : Chronic Care Management (RN follow up)   Cynthia Deleon is a 64 y.o. year old female who is a primary care patient of Loman Brooklyn, FNP. The CCM team was consulted for assistance with chronic disease management and care coordination needs.    Review of patient status, including review of consultants reports, relevant laboratory and other test results, and collaboration with appropriate care team members and the patient's provider was performed as part of comprehensive patient evaluation and provision of chronic care management services.    SDOH (Social Determinants of Health) assessments performed: No See Care Plan activities for detailed interventions related to [facility])     Outpatient Encounter Medications as of 02/11/2020  Medication Sig  . albuterol (VENTOLIN HFA) 108 (90 Base) MCG/ACT inhaler Inhale 2 puffs into the lungs every 6 (six) hours as needed for wheezing or shortness of breath.  [name] atorvastatin (LIPITOR) 40 MG tablet TAKE ONE TABLET BY MOUTH ONCE DAILY.  . cyclobenzaprine (FLEXERIL) 10 MG tablet Take 10 mg by mouth 3 (three) times daily as needed.  [name] DEXILANT 60 MG capsule Take 1 capsule by mouth daily.  . diazepam (VALIUM) 5 MG tablet Take 1 tablet (5 mg total) by mouth every 8 (eight) hours as needed (vertigo). (Patient taking differently: Take 5 mg by mouth in the morning and at bedtime. )  . doxycycline (VIBRA-TABS) 100 MG tablet Take 1 tablet (100 mg total) by mouth 2 (two) times daily. 1 po bid  . estrogens, conjugated, (PREMARIN) 0.3 MG tablet Take 1 tablet (0.3 mg total) by mouth daily.  . fluconazole (DIFLUCAN) 150 MG tablet 1 po q week x 4 weeks  . fluticasone (FLONASE) 50 MCG/ACT nasal spray Place 2 sprays into both nostrils daily.  [name] glipiZIDE (GLUCOTROL) 5 MG tablet Take 1 tablet (5 mg total)  by mouth daily before breakfast.  . glucose blood (CONTOUR NEXT TEST) test strip Test BS 4 times daily Dx E11.9  . lamoTRIgine (LAMICTAL) 200 MG tablet Take 200 mg by mouth at bedtime.   [name] levocetirizine (XYZAL) 5 MG tablet TAKE ONE TABLET BY MOUTH IN THE EVENING.  . metFORMIN (GLUCOPHAGE) 500 MG tablet Take 500 mg by mouth 2 (two) times daily with a meal.  . Microlet Lancets MISC by Does not apply route.  [name] oxyCODONE (ROXICODONE) 15 MG immediate release tablet Take 7.5 mg by mouth every 6 (six) hours as needed.   . pregabalin (LYRICA) 150 MG capsule Take 150 mg by mouth 2 (two) times daily.  . sertraline (ZOLOFT) 100 MG tablet Take 100 mg by mouth daily.   . Suvorexant (BELSOMRA) 20 MG TABS Take 1 tablet by mouth at bedtime.  . triamcinolone ointment (KENALOG) 0.5 % Apply 1 application topically 2 (two) times daily.  . valACYclovir (VALTREX) 500 MG tablet Take 500 mg by mouth daily as needed (shingles flare up).    No facility-administered encounter medications on file as of 02/11/2020.      RN Care Plan: Goals Addressed              This Visit's Progress     Patient Stated   .  "I want to keep my blood sugar under control" (pt-stated)        CARE PLAN ENTRY (see longitudinal plan of care for additional care plan information)  Current Barriers:  . Chronic Disease Management support and education needs related to diabetes  Nurse Case Manager Clinical Goal(s):  [name] Over the next 15 days, patient will talk with RN Care Manager regarding diabetes management . Over the next 30 days, patient will have visit with [facility] Clinical pharmacist to discuss medications and diabetes management  Interventions:  . Inter-disciplinary care team collaboration (see longitudinal plan of care) . Chart reviewed including recent office notes and lab results . Collaborated with PharmD regarding blood sugar management . Discussed home blood sugar readgins . Talked with patient about diet and meal  planning . Collaborated with [facility] Clinical staff regarding professional freestyle libre sensor placement and rescheduling of appt with PharmD . Provided with RNCM contact number and encouraged to reach out as needed . Encouraged to keep all medical appts . Encouraged to continue checking blood sugar at least twice daily and to report to PCP any readings outside of recommended range  Patient Self Care Activities:  . Performs ADL's independently . Calls provider office for new concerns or questions . Unable to independently drive presently  Please see past updates related to this goal by clicking on the "Past Updates" button in the selected goal          Plan:   The care management team will reach out to the patient again over the next 45 days.    [name], BSN, RN-BC Embedded Chronic Care Manager [facility] [place] Management Direct Dial: [phone]

## 2020-02-12 NOTE — Telephone Encounter (Signed)
If patient needs a new prescription for her COPD, I recommend she schedule an appointment. I have only seen her once and we refilled everything she had been taking for the past six months per pharmacy dispense history as she requested.   Please also reschedule her visit with Almyra Free if this has not been done yet.

## 2020-02-24 ENCOUNTER — Telehealth: Payer: Self-pay | Admitting: Family Medicine

## 2020-02-24 NOTE — Telephone Encounter (Signed)
Please see telephone note from 02/11/20.

## 2020-02-25 NOTE — Telephone Encounter (Signed)
Appt made

## 2020-02-25 NOTE — Telephone Encounter (Signed)
Patient can make appt with PharmD to put on [facility] for 2 weeks.  This may or may not be covered on insurance, however we can do a 2 week sample  52min appt pharmacy clinic  Thanks!

## 2020-02-25 NOTE — Telephone Encounter (Signed)
Already made

## 2020-02-28 ENCOUNTER — Ambulatory Visit (INDEPENDENT_AMBULATORY_CARE_PROVIDER_SITE_OTHER): Payer: BC Managed Care – PPO | Admitting: Pharmacist

## 2020-02-28 ENCOUNTER — Encounter: Payer: Self-pay | Admitting: Pharmacist

## 2020-02-28 ENCOUNTER — Other Ambulatory Visit: Payer: Self-pay

## 2020-02-28 DIAGNOSIS — E119 Type 2 diabetes mellitus without complications: Secondary | ICD-10-CM | POA: Diagnosis not present

## 2020-02-28 MED ORDER — FREESTYLE LIBRE 2 SENSOR MISC: 0 refills | Status: DC

## 2020-02-28 MED ORDER — ONETOUCH VERIO FLEX SYSTEM W/DEVICE KIT: PACK | 1 refills | Status: DC

## 2020-02-28 MED ORDER — ONETOUCH DELICA LANCETS 30G MISC: 3 refills | Status: AC

## 2020-02-28 MED ORDER — ANORO ELLIPTA 62.5-25 MCG/INH IN AEPB: 1.0000 | INHALATION_SPRAY | Freq: Every day | RESPIRATORY_TRACT | 6 refills | Status: DC

## 2020-02-28 NOTE — Progress Notes (Signed)
    02/28/2020 Name: Cynthia Deleon MRN: [id number] DOB: 16-Nov-1955   S:  37 yoF Presents for diabetes evaluation, education, and management Patient was referred and last seen by Primary Care Provider on 01/13/20.  Insurance coverage/medication affordability: [employer] commercial  Patient reports adherence with medications. . Current diabetes medications include: metformin . Current hypertension medications include: n/a Goal 130/80 . Current hyperlipidemia medications include: atorvastatin  Patient denies hypoglycemic events.   Patient reported dietary habits: Eats 1 meals/day -patient reports she does not have an appetites in the mornings; she used to drink a carnation instant breakfast, but was told to stop this due to sugar content -She does enjoy ice cream at night -encouraged patient to eat at least 2 meals a day if able  Discussed meal planning options and Plate method for healthy eating . Avoid sugary drinks and desserts . Incorporate balanced protein, non starchy veggies, 1 serving of carbohydrate with each meal . Increase water intake . Increase physical activity as able   Patient-reported exercise habits: walks  O:  Lab Results  Component Value Date   HGBA1C 5.9 12/02/2019    There were no vitals filed for this visit.     Lipid Panel     Component Value Date/Time   CHOL 147 12/02/2019 1042   TRIG 217 (H) 12/02/2019 1042   HDL 45 12/02/2019 1042   CHOLHDL 3.3 12/02/2019 1042   CHOLHDL 4.1 11/16/2015 0715   VLDL 48 (H) 11/16/2015 0715   LDLCALC 67 12/02/2019 1042    Home fasting blood sugars: 90-100s  2 hour post-meal/random blood sugars: n/a.     A/P:  Diabetes T2DM, A1c 5.9%.  PCP held glipizide due to low A1c.  Will place [place] 2 CGM system to gauge patient's blood sugar as we suspect hypoglycemia  Patient is able to verbalize appropriate hypoglycemia management plan. Patient is adherent with medication.   -Continue metformin as  prescribed.  -Start CGM libre2 to determine patient's blood sugars throughout the day  -Anoro Ellipta inhaler refilled.  Patient has a 17.5 pack year history (quit in 2014).  Reports difficulty breathing and SOB.  She does use her rescue inhaler, but has been out of her Anoro for months.  -Extensively discussed pathophysiology of diabetes, recommended lifestyle interventions, dietary effects on blood sugar control  -Counseled on s/sx of and management of hypoglycemia  -Next A1C anticipated 6-12 months   Written patient instructions provided.  Total time in face to face counseling 30 minutes.   Follow up Pharmacist Clinic Visit in 2 weeks.    Regina Eck, PharmD, BCPS Clinical Pharmacist, [place]  II Phone [phone]

## 2020-02-29 ENCOUNTER — Telehealth: Payer: Self-pay | Admitting: Pharmacist

## 2020-02-29 ENCOUNTER — Ambulatory Visit: Payer: BC Managed Care – PPO | Admitting: Family Medicine

## 2020-02-29 MED ORDER — FREESTYLE LIBRE 2 SENSOR MISC: 11 refills | Status: DC

## 2020-02-29 NOTE — Telephone Encounter (Signed)
Patient requesting [name].  Will call in to pharmacy to make sure it is approved

## 2020-02-29 NOTE — Telephone Encounter (Signed)
Patient needs prior approval (medical necessity) sent in to [facility] for diabetes shoes  She would like to use [facility] for prescription diabetes shoes  Per patient : It must state that she has diabetes and neuropathy

## 2020-02-29 NOTE — Telephone Encounter (Signed)
Scheduled patient with [name] PCP for next week

## 2020-02-29 NOTE — Telephone Encounter (Addendum)
Left message for pt to return call.  Last OV with Cynthia Deleon for DM was in June.  She needs a diabetic foot exam prior to ordering the shoes.

## 2020-03-02 ENCOUNTER — Other Ambulatory Visit: Payer: Self-pay | Admitting: Family Medicine

## 2020-03-02 DIAGNOSIS — E11649 Type 2 diabetes mellitus with hypoglycemia without coma: Secondary | ICD-10-CM

## 2020-03-02 DIAGNOSIS — N952 Postmenopausal atrophic vaginitis: Secondary | ICD-10-CM

## 2020-03-02 DIAGNOSIS — Z79899 Other long term (current) drug therapy: Secondary | ICD-10-CM

## 2020-03-02 DIAGNOSIS — G6289 Other specified polyneuropathies: Secondary | ICD-10-CM

## 2020-03-03 ENCOUNTER — Telehealth: Payer: Self-pay | Admitting: Pharmacist

## 2020-03-03 DIAGNOSIS — G6289 Other specified polyneuropathies: Secondary | ICD-10-CM

## 2020-03-03 MED ORDER — PREGABALIN 150 MG PO CAPS: 150.0000 mg | ORAL_CAPSULE | Freq: Two times a day (BID) | ORAL | 2 refills | Status: DC

## 2020-03-03 NOTE — Telephone Encounter (Signed)
Patient requesting Lyrica refill Will route to PCP

## 2020-03-06 ENCOUNTER — Other Ambulatory Visit: Payer: Self-pay | Admitting: Family Medicine

## 2020-03-06 DIAGNOSIS — G6289 Other specified polyneuropathies: Secondary | ICD-10-CM

## 2020-03-07 ENCOUNTER — Other Ambulatory Visit: Payer: Self-pay

## 2020-03-07 ENCOUNTER — Ambulatory Visit: Payer: BC Managed Care – PPO | Admitting: Family Medicine

## 2020-03-07 ENCOUNTER — Encounter: Payer: Self-pay | Admitting: Family Medicine

## 2020-03-07 ENCOUNTER — Telehealth: Payer: Self-pay | Admitting: Pharmacist

## 2020-03-07 ENCOUNTER — Ambulatory Visit (INDEPENDENT_AMBULATORY_CARE_PROVIDER_SITE_OTHER): Payer: BC Managed Care – PPO | Admitting: Family Medicine

## 2020-03-07 VITALS — BP 141/84 | HR 90 | Temp 98.0°F | Ht 69.0 in | Wt 172.0 lb

## 2020-03-07 DIAGNOSIS — L659 Nonscarring hair loss, unspecified: Secondary | ICD-10-CM | POA: Diagnosis not present

## 2020-03-07 DIAGNOSIS — I10 Essential (primary) hypertension: Secondary | ICD-10-CM

## 2020-03-07 DIAGNOSIS — K219 Gastro-esophageal reflux disease without esophagitis: Secondary | ICD-10-CM

## 2020-03-07 DIAGNOSIS — J449 Chronic obstructive pulmonary disease, unspecified: Secondary | ICD-10-CM

## 2020-03-07 DIAGNOSIS — G6289 Other specified polyneuropathies: Secondary | ICD-10-CM | POA: Diagnosis not present

## 2020-03-07 DIAGNOSIS — E119 Type 2 diabetes mellitus without complications: Secondary | ICD-10-CM

## 2020-03-07 LAB — BAYER DCA HB A1C WAIVED: HB A1C (BAYER DCA - WAIVED): 6.8 % (ref <7.0)

## 2020-03-07 MED ORDER — GLIPIZIDE ER 5 MG PO TB24: 5.0000 mg | ORAL_TABLET | Freq: Every evening | ORAL | 2 refills | Status: DC

## 2020-03-07 MED ORDER — LISINOPRIL 5 MG PO TABS: 5.0000 mg | ORAL_TABLET | Freq: Every day | ORAL | 2 refills | Status: DC

## 2020-03-07 MED ORDER — PREGABALIN 150 MG PO CAPS: 150.0000 mg | ORAL_CAPSULE | Freq: Two times a day (BID) | ORAL | 2 refills | Status: DC

## 2020-03-07 MED ORDER — METFORMIN HCL ER 500 MG PO TB24: 500.0000 mg | ORAL_TABLET | Freq: Every evening | ORAL | 2 refills | Status: DC

## 2020-03-07 MED ORDER — METFORMIN HCL 500 MG PO TABS: ORAL_TABLET | ORAL | 1 refills | Status: DC

## 2020-03-07 NOTE — Telephone Encounter (Signed)
Changed to glipizide XL 5mg  with dinner Changed to metformin XR 500mg  1 tablet with dinner Patient with hypoglycemia in the AM; She has not been eating breakfast Changes verbalized to patient  Changes communicated with the pharmacy

## 2020-03-07 NOTE — Patient Instructions (Addendum)
You need to schedule a diabetic eye exam and your colonoscopy. You are due for both.   Stop Glipizide.   Increase Metformin to 1,000 mg (2 tablets) with breakfast and 500 mg (1 tablet) with supper.  Start Lisinopril 5 mg once daily.   Your order for diabetic shoes has been sent to [facility].

## 2020-03-07 NOTE — Telephone Encounter (Signed)
Filled during visit today.

## 2020-03-07 NOTE — Progress Notes (Signed)
Assessment & Plan:  1. Type 2 diabetes mellitus without complication, without long-term current use of insulin (HCC) Lab Results  Component Value Date   HGBA1C 6.8 03/07/2020   HGBA1C 5.9 12/02/2019   HGBA1C 6.1 (H) 04/17/2016  - Diabetes is at goal of A1c < 7. - Medications: increase metformin from 500 mg BID to 1,000 mg in the AM and 500 mg in the PM; D/C glipizide - Home glucose monitoring: wearing the [facility] - Patient is currently taking a statin. Patient is taking an ACE-inhibitor/ARB.  - Last foot exam: 03/07/2020 - Last diabetic eye exam: unknown - Urine Microalbumin/Creat Ratio: 12/02/2019 - Instruction/counseling given: reminded to get eye exam and discussed foot care - Bayer DCA Hb A1c Waived - Lipid panel - CMP14+EGFR - For Home Use Only DME Diabetic Shoe - lisinopril (ZESTRIL) 5 MG tablet; Take 1 tablet (5 mg total) by mouth daily.  Dispense: 30 tablet; Refill: 2  2. Other polyneuropathy - Patient needs diabetic shoes. Rx faxed to [facility].  - CMP14+EGFR - For Home Use Only DME Diabetic Shoe - pregabalin (LYRICA) 150 MG capsule; Take 1 capsule (150 mg total) by mouth 2 (two) times daily.  Dispense: 60 capsule; Refill: 2  3. Essential hypertension - Mildly elevated. Started on Lisinopril today.  - Lipid panel - CMP14+EGFR - Thyroid Panel With TSH - lisinopril (ZESTRIL) 5 MG tablet; Take 1 tablet (5 mg total) by mouth daily.  Dispense: 30 tablet; Refill: 2  4. Hair loss - Thyroid Panel With TSH  5. Chronic obstructive pulmonary disease, unspecified COPD type ([place]) - Well controlled on current regimen.   6. Gastroesophageal reflux disease, unspecified whether esophagitis present - Well controlled on current regimen.  - CMP14+EGFR   Return in about 4 weeks (around 04/04/2020) for [place] for DM, 3 months with me for DM.  [name], MSN, APRN, FNP-C [facility]  Subjective:    [name],  female    DOB: [birth year], 64 y.o.   MRN: [id number]  Patient Care Team: Loman Brooklyn, FNP as PCP - General (Family Medicine) Suella Broad, MD as Consulting Physician (Physical Medicine and Rehabilitation) Norma Fredrickson, MD as Consulting Physician (Psychiatry) Ilean China, RN as Registered Nurse Phillips Odor, MD as Consulting Physician (Neurology) Lavera Guise, [facility] (Pharmacist)   Chief Complaint:  Chief Complaint  Patient presents with   Diabetes    check up of chronic medical conditions.    Alopecia    Patient would like her Thyroid checked.  States this has been going on for 2 months    HPI: Cynthia Deleon is a 64 y.o. female presenting on 03/07/2020 for Diabetes (check up of chronic medical conditions. ) and Alopecia (Patient would like her Thyroid checked.  States this has been going on for 2 months)  Diabetes: Patient presents for follow up of diabetes. Known diabetic complications: peripheral neuropathy. Medication compliance: yes. Current diet: in general, a "healthy" diet  . Current exercise: none. Home blood sugar records: patient is wearing the freestyle libre but is unable to tell me what her sugars have been running. Is she on ACE inhibitor or angiotensin II receptor blocker? No. Is she on a statin? Yes.   Lab Results  Component Value Date   HGBA1C 6.8 03/07/2020   HGBA1C 5.9 12/02/2019   HGBA1C 6.1 (H) 04/17/2016   Lab Results  Component Value Date   LDLCALC 67 12/02/2019   CREATININE 0.70 12/02/2019  New complaints: Patient reports she has been losing her hair for the past couple of months and would like her thyroid checked.    Social history:  Relevant past medical, surgical, family and social history reviewed and updated as indicated. Interim medical history since our last visit reviewed.  Allergies and medications reviewed and updated.  DATA REVIEWED: CHART IN EPIC  ROS: Negative unless specifically indicated above in HPI.     Current Outpatient Medications:    albuterol (VENTOLIN HFA) 108 (90 Base) MCG/ACT inhaler, Inhale 2 puffs into the lungs every 6 (six) hours as needed for wheezing or shortness of breath., Disp: 18 g, Rfl: 2   atorvastatin (LIPITOR) 40 MG tablet, TAKE ONE TABLET BY MOUTH ONCE DAILY., Disp: 90 tablet, Rfl: 1   Continuous Blood Gluc Sensor (FREESTYLE LIBRE 2 SENSOR) MISC, USE TO TEST BLOOD SUGAR 6 TIMES DAILY DX:e11.9, Disp: 2 each, Rfl: 11   cyclobenzaprine (FLEXERIL) 10 MG tablet, Take 10 mg by mouth 3 (three) times daily as needed., Disp: , Rfl:    DEXILANT 60 MG capsule, Take 1 capsule by mouth daily., Disp: , Rfl:    diazepam (VALIUM) 5 MG tablet, Take 1 tablet (5 mg total) by mouth every 8 (eight) hours as needed (vertigo). (Patient taking differently: Take 5 mg by mouth in the morning and at bedtime. ), Disp: 10 tablet, Rfl: 0   fluticasone (FLONASE) 50 MCG/ACT nasal spray, Place 2 sprays into both nostrils daily., Disp: 16 g, Rfl: 5   glipiZIDE (GLUCOTROL) 5 MG tablet, TAKE (1) TABLET BY MOUTH ONCE DAILY BEFORE BREAKFAST., Disp: 30 tablet, Rfl: 0   glucose blood (CONTOUR NEXT TEST) test strip, Test BS 4 times daily Dx E11.9, Disp: 400 strip, Rfl: 3   lamoTRIgine (LAMICTAL) 200 MG tablet, Take 200 mg by mouth at bedtime. , Disp: , Rfl:    levocetirizine (XYZAL) 5 MG tablet, TAKE ONE TABLET BY MOUTH IN THE EVENING., Disp: 30 tablet, Rfl: 5   metFORMIN (GLUCOPHAGE) 500 MG tablet, Take 500 mg by mouth 2 (two) times daily with a meal., Disp: , Rfl:    OneTouch Delica Lancets 62I MISC, Use to test blood sugar twice daily. DX: E11.9, Disp: 100 each, Rfl: 3   oxyCODONE (ROXICODONE) 15 MG immediate release tablet, Take 7.5 mg by mouth every 6 (six) hours as needed. , Disp: , Rfl:    pregabalin (LYRICA) 150 MG capsule, Take 1 capsule (150 mg total) by mouth 2 (two) times daily., Disp: 60 capsule, Rfl: 2   PREMARIN 0.3 MG tablet, TAKE (1) TABLET BY MOUTH ONCE DAILY., Disp: 30 tablet,  Rfl: 0   sertraline (ZOLOFT) 100 MG tablet, Take 100 mg by mouth daily. , Disp: , Rfl:    Suvorexant (BELSOMRA) 20 MG TABS, Take 1 tablet by mouth at bedtime., Disp: , Rfl:    triamcinolone ointment (KENALOG) 0.5 %, Apply 1 application topically 2 (two) times daily., Disp: 30 g, Rfl: 0   umeclidinium-vilanterol (ANORO ELLIPTA) 62.5-25 MCG/INH AEPB, Inhale 1 puff into the lungs daily., Disp: 30 each, Rfl: 6   valACYclovir (VALTREX) 500 MG tablet, Take 500 mg by mouth daily as needed (shingles flare up). , Disp: , Rfl: 4   Allergies  Allergen Reactions   Chantix [Varenicline] Other (See Comments)    Altered mental status-Per patient was put on allergy list by Dr Lorriane Shire bu patient staes she is not allergic to this and is currently taking it.    Divalproex Sodium Other (See Comments)  Hallucinations  Other reaction(s): Confusion   Pseudoeph-Hydrocodone-Gg Nausea Only   Sulfa Antibiotics Anaphylaxis and Nausea Only   Varenicline Tartrate     Other reaction(s): Confusion   Ambien [Zolpidem] Other (See Comments)    Causes sleep walking   Codeine Nausea And Vomiting   Hydrocodone Nausea And Vomiting   Paxil [Paroxetine Hcl] Other (See Comments)    Causes ringing in the ears.    Sulfur Other (See Comments)    Swelling of tongue   Amoxicillin Rash    Has patient had a PCN reaction causing immediate rash, facial/tongue/throat swelling, SOB or lightheadedness with hypotension: No Has patient had a PCN reaction causing severe rash involving mucus membranes or skin necrosis: No Has patient had a PCN reaction that required hospitalization: No Has patient had a PCN reaction occurring within the last 10 years: Yes If all of the above answers are "NO", then may proceed with Cephalosporin use.    Tape Rash   Past Medical History:  Diagnosis Date   Arthritis    hands and knees   Bipolar 1 disorder (HCC)    Borderline diabetic    Chronic back pain    Chronic neck pain     Complication of anesthesia    high anxiety-does not want to be alone   COPD (chronic obstructive pulmonary disease) (HCC)    Current use of estrogen therapy 01/12/2014   Depression    Diabetes mellitus without complication (HCC)    GERD (gastroesophageal reflux disease)    Headache    Hot flashes 12/15/2013   Hypertension    IBS (irritable bowel syndrome)    Incontinence    Kidney stone    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HCC)    Panic attacks    Peptic ulcer    Peripheral neuropathy    Rosacea    Shingles    Sleep apnea    uses a cpap-with oxygen    Past Surgical History:  Procedure Laterality Date   ABDOMINAL HYSTERECTOMY     BUNIONECTOMY WITH HAMMERTOE RECONSTRUCTION Right 12/10/2012   Procedure: RIGHT FIRST METATARSAL CHEVRON BUNION CORRECTION,  2 AND 3 HAMMERTOE CORRECTION , RIGHT 3 AND 4 TOE NAIL EXCISION ;  Surgeon: Wylene Simmer, MD;  Location: [facility];  Service: Orthopedics;  Laterality: Right;   FOOT ARTHRODESIS  2000   both feet   LIPOSUCTION  03/2018   RECTAL SURGERY     TONSILLECTOMY      Social History   Socioeconomic History   Marital status: Divorced    Spouse name: Not on file   Number of children: 5   Years of education: 9th   Highest education level: Not on file  Occupational History   Occupation: Disabled  Tobacco Use   Smoking status: Current Some Day Smoker    Packs/day: 0.50    Years: 35.00    Pack years: 17.50    Types: Cigarettes    Last attempt to quit: 10/19/2012    Years since quitting: 7.3   Smokeless tobacco: Never Used  Vaping Use   Vaping Use: Never used  Substance and Sexual Activity   Alcohol use: No    Alcohol/week: 0.0 standard drinks   Drug use: No   Sexual activity: Never    Birth control/protection: Surgical    Comment: occ cigarette  Other Topics Concern   Not on file  Social History Narrative   Lives at home with home with her son (has five children).    Right-handed.  1 cup caffeine per day.   Social Determinants of Health   Financial Resource Strain:    Difficulty of Paying Living Expenses: Not on file  Food Insecurity: No Food Insecurity   Worried About Charity fundraiser in the Last Year: Never true   Ran Out of Food in the Last Year: Never true  Transportation Needs: No Transportation Needs   Lack of Transportation (Medical): No   Lack of Transportation (Non-Medical): No  Physical Activity:    Days of Exercise per Week: Not on file   Minutes of Exercise per Session: Not on file  Stress:    Feeling of Stress : Not on file  Social Connections:    Frequency of Communication with Friends and Family: Not on file   Frequency of Social Gatherings with Friends and Family: Not on file   Attends Religious Services: Not on file   Active Member of Clubs or Organizations: Not on file   Attends Archivist Meetings: Not on file   Marital Status: Not on file  Intimate Partner Violence:    Fear of Current or Ex-Partner: Not on file   Emotionally Abused: Not on file   Physically Abused: Not on file   Sexually Abused: Not on file        Objective:    BP (!) 141/84    Pulse 90    Temp 98 F (36.7 C) (Temporal)    Ht _0  (1.753 m)    Wt 172 lb (78 kg)    SpO2 95%    BMI 25.40 kg/m   Wt Readings from Last 3 Encounters:  01/13/20 172 lb (78 kg)  12/17/19 169 lb 3.2 oz (76.7 kg)  12/08/19 169 lb 6.4 oz (76.8 kg)    Physical Exam Vitals reviewed.  Constitutional:      General: She is not in acute distress.    Appearance: Normal appearance. She is normal weight. She is not ill-appearing, toxic-appearing or diaphoretic.  HENT:     Head: Normocephalic and atraumatic.  Eyes:     General: No scleral icterus.       Right eye: No discharge.        Left eye: No discharge.     Conjunctiva/sclera: Conjunctivae normal.  Cardiovascular:     Rate and Rhythm: Normal rate and regular rhythm.     Heart sounds:  Normal heart sounds. No murmur heard.  No friction rub. No gallop.   Pulmonary:     Effort: Pulmonary effort is normal. No respiratory distress.     Breath sounds: Normal breath sounds. No stridor. No wheezing, rhonchi or rales.  Musculoskeletal:        General: Normal range of motion.     Cervical back: Normal range of motion.  Skin:    General: Skin is warm and dry.     Capillary Refill: Capillary refill takes less than 2 seconds.  Neurological:     General: No focal deficit present.     Mental Status: She is alert and oriented to person, place, and time. Mental status is at baseline.  Psychiatric:        Attention and Perception: Attention and perception normal.        Mood and Affect: Mood normal.        Speech: Speech is rapid and pressured.        Behavior: Behavior is hyperactive.        Thought Content: Thought content normal.  Cognition and Memory: Cognition normal.        Judgment: Judgment normal.    Diabetic Foot Exam - Simple   Simple Foot Form Diabetic Foot exam was performed with the following findings: Yes 03/07/2020  8:43 AM  Visual Inspection No deformities, no ulcerations, no other skin breakdown bilaterally: Yes Sensation Testing See comments: Yes Pulse Check Posterior Tibialis and Dorsalis pulse intact bilaterally: Yes Comments Patient is only able to feel monofilament on great toes bilaterally.      Lab Results  Component Value Date   TSH 0.684 04/17/2016   Lab Results  Component Value Date   WBC 7.8 12/02/2019   HGB 15.2 12/02/2019   HCT 43.6 12/02/2019   MCV 93 12/02/2019   PLT 214 12/02/2019   Lab Results  Component Value Date   NA 140 12/02/2019   K 4.9 12/02/2019   CO2 24 12/02/2019   GLUCOSE 134 (H) 12/02/2019   BUN 18 12/02/2019   CREATININE 0.70 12/02/2019   BILITOT 0.3 12/02/2019   ALKPHOS 84 12/02/2019   AST 16 12/02/2019   ALT 10 12/02/2019   PROT 6.3 12/02/2019   ALBUMIN 4.4 12/02/2019   CALCIUM 10.1 12/02/2019    ANIONGAP 8 04/22/2018   Lab Results  Component Value Date   CHOL 147 12/02/2019   Lab Results  Component Value Date   HDL 45 12/02/2019   Lab Results  Component Value Date   LDLCALC 67 12/02/2019   Lab Results  Component Value Date   TRIG 217 (H) 12/02/2019   Lab Results  Component Value Date   CHOLHDL 3.3 12/02/2019   Lab Results  Component Value Date   HGBA1C 5.9 12/02/2019

## 2020-03-08 ENCOUNTER — Telehealth: Payer: Self-pay | Admitting: Family Medicine

## 2020-03-08 LAB — CMP14+EGFR
ALT: 7 IU/L (ref 0–32)
AST: 11 IU/L (ref 0–40)
Albumin/Globulin Ratio: 2.7 — ABNORMAL HIGH (ref 1.2–2.2)
Albumin: 4.6 g/dL (ref 3.8–4.8)
Alkaline Phosphatase: 68 IU/L (ref 44–121)
BUN/Creatinine Ratio: 25 (ref 12–28)
BUN: 19 mg/dL (ref 8–27)
Bilirubin Total: 0.4 mg/dL (ref 0.0–1.2)
CO2: 23 mmol/L (ref 20–29)
Calcium: 9.7 mg/dL (ref 8.7–10.3)
Chloride: 104 mmol/L (ref 96–106)
Creatinine, Ser: 0.75 mg/dL (ref 0.57–1.00)
GFR calc Af Amer: 97 mL/min/{1.73_m2} (ref >59)
GFR calc non Af Amer: 85 mL/min/{1.73_m2} (ref >59)
Globulin, Total: 1.7 g/dL (ref 1.5–4.5)
Glucose: 139 mg/dL — ABNORMAL HIGH (ref 65–99)
Potassium: 4.2 mmol/L (ref 3.5–5.2)
Sodium: 143 mmol/L (ref 134–144)
Total Protein: 6.3 g/dL (ref 6.0–8.5)

## 2020-03-08 LAB — LIPID PANEL
Chol/HDL Ratio: 3.9 ratio (ref 0.0–4.4)
Cholesterol, Total: 152 mg/dL (ref 100–199)
HDL: 39 mg/dL — ABNORMAL LOW (ref >39)
LDL Chol Calc (NIH): 79 mg/dL (ref 0–99)
Triglycerides: 205 mg/dL — ABNORMAL HIGH (ref 0–149)
VLDL Cholesterol Cal: 34 mg/dL (ref 5–40)

## 2020-03-08 LAB — THYROID PANEL WITH TSH
Free Thyroxine Index: 1.7 (ref 1.2–4.9)
T3 Uptake Ratio: 18 % — ABNORMAL LOW (ref 24–39)
T4, Total: 9.3 ug/dL (ref 4.5–12.0)
TSH: 1.06 u[IU]/mL (ref 0.450–4.500)

## 2020-03-08 NOTE — Telephone Encounter (Signed)
Returned call to patient No answer, no VM

## 2020-03-09 NOTE — Telephone Encounter (Signed)
BP readings 124/99, hr 99 137/88, 110 155/93, 102 148/89, 104 143/87, 101 158/90, 104 148/93, 105 150/95, 104 145/84, 101 145/85, 101 142/86, 96 142/87, 96  Just started lisinopril 5mg  two days ago.  Will give it more time to work before increasing.  Will likely increase lisinopril to 10mg  daily.  Encouraged patient to call back next week. Denies chest pain

## 2020-03-10 DIAGNOSIS — J449 Chronic obstructive pulmonary disease, unspecified: Secondary | ICD-10-CM | POA: Diagnosis not present

## 2020-03-13 ENCOUNTER — Ambulatory Visit: Payer: BC Managed Care – PPO | Admitting: Pharmacist

## 2020-03-13 ENCOUNTER — Encounter: Payer: Self-pay | Admitting: Family Medicine

## 2020-03-13 ENCOUNTER — Telehealth: Payer: Self-pay | Admitting: *Deleted

## 2020-03-13 DIAGNOSIS — F310 Bipolar disorder, current episode hypomanic: Secondary | ICD-10-CM | POA: Diagnosis not present

## 2020-03-13 NOTE — Telephone Encounter (Signed)
Patient states that Freestyle [name] has been reading 69s but when she is uses finger stick glucometer her BS reading are reading higher.  Patient would like to speak to Clinical pharmacist regarding issues.

## 2020-03-13 NOTE — Telephone Encounter (Signed)
It looks like this was supposed to be directed to you.

## 2020-03-14 DIAGNOSIS — M5136 Other intervertebral disc degeneration, lumbar region: Secondary | ICD-10-CM | POA: Diagnosis not present

## 2020-03-14 DIAGNOSIS — M5030 Other cervical disc degeneration, unspecified cervical region: Secondary | ICD-10-CM | POA: Diagnosis not present

## 2020-03-15 NOTE — Telephone Encounter (Signed)
Patient's Cynthia Deleon is readings off Recommended she discontinue high dose vitamin C Last night BG 200s

## 2020-03-16 ENCOUNTER — Ambulatory Visit (INDEPENDENT_AMBULATORY_CARE_PROVIDER_SITE_OTHER): Payer: BC Managed Care – PPO | Admitting: Family Medicine

## 2020-03-16 ENCOUNTER — Ambulatory Visit: Payer: BC Managed Care – PPO | Admitting: Pharmacist

## 2020-03-16 ENCOUNTER — Encounter: Payer: Self-pay | Admitting: Family Medicine

## 2020-03-16 DIAGNOSIS — R059 Cough, unspecified: Secondary | ICD-10-CM

## 2020-03-16 DIAGNOSIS — J019 Acute sinusitis, unspecified: Secondary | ICD-10-CM

## 2020-03-16 MED ORDER — PREDNISONE 10 MG (21) PO TBPK: ORAL_TABLET | ORAL | 0 refills | Status: DC

## 2020-03-16 MED ORDER — CEFDINIR 300 MG PO CAPS: 300.0000 mg | ORAL_CAPSULE | Freq: Two times a day (BID) | ORAL | 0 refills | Status: DC

## 2020-03-16 NOTE — Addendum Note (Signed)
Addended by: Earlene Plater on: 03/16/2020 10:59 AM   Modules accepted: Orders

## 2020-03-16 NOTE — Progress Notes (Signed)
Virtual Visit via Telephone Note  I connected with [name] on 03/16/20 at 9:59 AM by telephone and verified that I am speaking with the correct person using two identifiers. Cynthia Deleon is currently located at home and nobody is currently with her during this visit. The provider, Loman Brooklyn, FNP is located in their office at time of visit.  I discussed the limitations, risks, security and privacy concerns of performing an evaluation and management service by telephone and the availability of in person appointments. I also discussed with the patient that there may be a patient responsible charge related to this service. The patient expressed understanding and agreed to proceed.  Subjective: PCP: Loman Brooklyn, FNP  Chief Complaint  Patient presents with  . URI   Patient complains of cough, head congestion, headache, runny nose, sneezing, sore throat, facial pain/pressure, postnasal drainage and shortness of breath. Onset of symptoms was a few days ago, gradually worsening since that time. She is drinking plenty of fluids and urinating per her usual. Evaluation to date: none. Treatment to date: antihistamines, decongestants, nasal steroids and her inhalers. She has a history of COPD. She does not smoke. Patient has been vaccinated against COVID-19. She reports her symptoms all started after going to a family reunion where she later found out nobody had been vaccinated against COVID-19.    ROS: Per HPI  Current Outpatient Medications:  .  albuterol (VENTOLIN HFA) 108 (90 Base) MCG/ACT inhaler, Inhale 2 puffs into the lungs every 6 (six) hours as needed for wheezing or shortness of breath., Disp: 18 g, Rfl: 2 .  atorvastatin (LIPITOR) 40 MG tablet, TAKE ONE TABLET BY MOUTH ONCE DAILY., Disp: 90 tablet, Rfl: 1 .  Continuous Blood Gluc Sensor (FREESTYLE LIBRE 2 SENSOR) MISC, USE TO TEST BLOOD SUGAR 6 TIMES DAILY DX:e11.9, Disp: 2 each, Rfl: 11 .  cyclobenzaprine (FLEXERIL)  10 MG tablet, Take 10 mg by mouth 3 (three) times daily as needed., Disp: , Rfl:  .  DEXILANT 60 MG capsule, Take 1 capsule by mouth daily., Disp: , Rfl:  .  diazepam (VALIUM) 5 MG tablet, Take 1 tablet (5 mg total) by mouth every 8 (eight) hours as needed (vertigo). (Patient taking differently: Take 5 mg by mouth in the morning and at bedtime. ), Disp: 10 tablet, Rfl: 0 .  fluticasone (FLONASE) 50 MCG/ACT nasal spray, Place 2 sprays into both nostrils daily., Disp: 16 g, Rfl: 5 .  glipiZIDE (GLUCOTROL XL) 5 MG 24 hr tablet, Take 1 tablet (5 mg total) by mouth every evening. Take with evening meal, Disp: 30 tablet, Rfl: 2 .  glucose blood (CONTOUR NEXT TEST) test strip, Test BS 4 times daily Dx E11.9, Disp: 400 strip, Rfl: 3 .  lamoTRIgine (LAMICTAL) 200 MG tablet, Take 200 mg by mouth at bedtime. , Disp: , Rfl:  .  levocetirizine (XYZAL) 5 MG tablet, TAKE ONE TABLET BY MOUTH IN THE EVENING., Disp: 30 tablet, Rfl: 5 .  lisinopril (ZESTRIL) 5 MG tablet, Take 1 tablet (5 mg total) by mouth daily., Disp: 30 tablet, Rfl: 2 .  metFORMIN (GLUCOPHAGE-XR) 500 MG 24 hr tablet, Take 1 tablet (500 mg total) by mouth every evening. Take with dinner, Disp: 30 tablet, Rfl: 2 .  OneTouch Delica Lancets 82N MISC, Use to test blood sugar twice daily. DX: E11.9, Disp: 100 each, Rfl: 3 .  oxyCODONE (ROXICODONE) 15 MG immediate release tablet, Take 7.5 mg by mouth every 6 (six) hours as needed. ,  Disp: , Rfl:  .  pregabalin (LYRICA) 150 MG capsule, Take 1 capsule (150 mg total) by mouth 2 (two) times daily., Disp: 60 capsule, Rfl: 2 .  PREMARIN 0.3 MG tablet, TAKE (1) TABLET BY MOUTH ONCE DAILY., Disp: 30 tablet, Rfl: 0 .  sertraline (ZOLOFT) 100 MG tablet, Take 100 mg by mouth daily. , Disp: , Rfl:  .  Suvorexant (BELSOMRA) 20 MG TABS, Take 1 tablet by mouth at bedtime., Disp: , Rfl:  .  triamcinolone ointment (KENALOG) 0.5 %, Apply 1 application topically 2 (two) times daily., Disp: 30 g, Rfl: 0 .   umeclidinium-vilanterol (ANORO ELLIPTA) 62.5-25 MCG/INH AEPB, Inhale 1 puff into the lungs daily., Disp: 30 each, Rfl: 6 .  valACYclovir (VALTREX) 500 MG tablet, Take 500 mg by mouth daily as needed (shingles flare up). , Disp: , Rfl: 4  Allergies  Allergen Reactions  . Chantix [Varenicline] Other (See Comments)    Altered mental status-Per patient was put on allergy list by Dr Lorriane Shire bu patient staes she is not allergic to this and is currently taking it.   . Divalproex Sodium Other (See Comments)    Hallucinations  Other reaction(s): Confusion  . Pseudoeph-Hydrocodone-Gg Nausea Only  . Sulfa Antibiotics Anaphylaxis and Nausea Only  . Varenicline Tartrate     Other reaction(s): Confusion  . Ambien [Zolpidem] Other (See Comments)    Causes sleep walking  . Codeine Nausea And Vomiting  . Hydrocodone Nausea And Vomiting  . Paxil [Paroxetine Hcl] Other (See Comments)    Causes ringing in the ears.   . Sulfur Other (See Comments)    Swelling of tongue  . Amoxicillin Rash    Has patient had a PCN reaction causing immediate rash, facial/tongue/throat swelling, SOB or lightheadedness with hypotension: No Has patient had a PCN reaction causing severe rash involving mucus membranes or skin necrosis: No Has patient had a PCN reaction that required hospitalization: No Has patient had a PCN reaction occurring within the last 10 years: Yes If all of the above answers are "NO", then may proceed with Cephalosporin use.   . Tape Rash   Past Medical History:  Diagnosis Date  . Arthritis    hands and knees  . Bipolar 1 disorder ([place])   . Borderline diabetic   . Chronic back pain   . Chronic neck pain   . Complication of anesthesia    high anxiety-does not want to be alone  . COPD (chronic obstructive pulmonary disease) ([place])   . Current use of estrogen therapy 01/12/2014  . Depression   . Diabetes mellitus without complication ([place])   . GERD (gastroesophageal reflux disease)   . Headache    . Hot flashes 12/15/2013  . Hypertension   . IBS (irritable bowel syndrome)   . Incontinence   . Kidney stone   . 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[place])   . Panic attacks   . Peptic ulcer   . Peripheral neuropathy   . Rosacea   . Shingles   . Sleep apnea    uses a cpap-with oxygen    Observations/Objective: A&O  No respiratory distress or wheezing audible over the phone Mood, judgement, and thought processes all WNL  Assessment and Plan: 1. Acute non-recurrent sinusitis, unspecified location - Discussed symptom management. - cefdinir (OMNICEF) 300 MG capsule; Take 1 capsule (300 mg total) by mouth 2 (two) times daily for 7 days.  Dispense: 14 capsule; Refill: 0 - predniSONE (STERAPRED UNI-PAK 21 TAB) 10 MG (21) TBPK  tablet; As directed x 6 days  Dispense: 21 tablet; Refill: 0  2. Cough - Novel Coronavirus, NAA ([facility]); Future   Follow Up Instructions:  I discussed the assessment and treatment plan with the patient. The patient was provided an opportunity to ask questions and all were answered. The patient agreed with the plan and demonstrated an understanding of the instructions.   The patient was advised to call back or seek an in-person evaluation if the symptoms worsen or if the condition fails to improve as anticipated.  The above assessment and management plan was discussed with the patient. The patient verbalized understanding of and has agreed to the management plan. Patient is aware to call the clinic if symptoms persist or worsen. Patient is aware when to return to the clinic for a follow-up visit. Patient educated on when it is appropriate to go to the emergency department.   Time call ended: 10:16 AM  I provided 19 minutes of non-face-to-face time during this encounter.  [name], MSN, APRN, FNP-C [facility] 03/16/20

## 2020-03-17 ENCOUNTER — Other Ambulatory Visit: Payer: Self-pay | Admitting: Nurse Practitioner

## 2020-03-17 ENCOUNTER — Other Ambulatory Visit: Payer: Self-pay | Admitting: Family Medicine

## 2020-03-18 LAB — SARS-COV-2, NAA 2 DAY TAT

## 2020-03-18 LAB — NOVEL CORONAVIRUS, NAA: SARS-CoV-2, NAA: NOT DETECTED

## 2020-03-20 ENCOUNTER — Telehealth: Payer: Self-pay

## 2020-03-20 ENCOUNTER — Encounter: Payer: Self-pay | Admitting: Neurology

## 2020-03-20 ENCOUNTER — Ambulatory Visit (INDEPENDENT_AMBULATORY_CARE_PROVIDER_SITE_OTHER): Payer: BC Managed Care – PPO | Admitting: Neurology

## 2020-03-20 ENCOUNTER — Other Ambulatory Visit: Payer: Self-pay

## 2020-03-20 VITALS — BP 114/64 | HR 96 | Ht 69.0 in | Wt 171.5 lb

## 2020-03-20 DIAGNOSIS — H532 Diplopia: Secondary | ICD-10-CM | POA: Diagnosis not present

## 2020-03-20 NOTE — Progress Notes (Signed)
Chief Complaint  Patient presents with  . Consult    Last seen in 2018 for polyneuropathy.  [name] PCP    Loman Brooklyn, FNP    HISTORICAL  Cynthia Deleon is a 64 year old female, seen in request by primary care nurse practitioner [name] for evaluation of double vision,  I reviewed and summarized the referring note.  Past medical history Bipolar disorder, polypharmacy treatment, Hyperlipidemia Diabetes, COPD Chronic urinary urgency Obstructive sleep apnea, using CPAP machine,  I saw her in 2018 for progressive bilateral lower extremity paresthesia since 2012, EMG nerve conduction study in November 2017 showed no large fiber peripheral neuropathy  The diagnosis of small fiber neuropathy was confirmed by skin biopsy in March 2018, there was reduced epidermal nerve fiber density  She reported few recurrent episode of double vision since September 2021, it will happen while driving, the dividing line between lanes becomes blurry, lasting for 30 minutes, she denies dizziness, has baseline chronic low back pain, mild unsteady gait, she denies lateralized motor or sensory deficit during the spell.  She was seen by ophthalmologist, was told normal  She denies ptosis, no bulbar weakness, no limb muscle weakness.  REVIEW OF SYSTEMS: Full 14 system review of systems performed and notable only for as above All other review of systems were negative.  ALLERGIES: Allergies  Allergen Reactions  . Chantix [Varenicline] Other (See Comments)    Altered mental status-Per patient was put on allergy list by Dr Lorriane Shire bu patient staes she is not allergic to this and is currently taking it.   . Divalproex Sodium Other (See Comments)    Hallucinations  Other reaction(s): Confusion  . Pseudoeph-Hydrocodone-Gg Nausea Only  . Sulfa Antibiotics Anaphylaxis and Nausea Only  . Varenicline Tartrate     Other reaction(s): Confusion  . Ambien [Zolpidem] Other (See Comments)    Causes sleep  walking  . Codeine Nausea And Vomiting  . Hydrocodone Nausea And Vomiting  . Paxil [Paroxetine Hcl] Other (See Comments)    Causes ringing in the ears.   . Sulfur Other (See Comments)    Swelling of tongue  . Amoxicillin Rash    Has patient had a PCN reaction causing immediate rash, facial/tongue/throat swelling, SOB or lightheadedness with hypotension: No Has patient had a PCN reaction causing severe rash involving mucus membranes or skin necrosis: No Has patient had a PCN reaction that required hospitalization: No Has patient had a PCN reaction occurring within the last 10 years: Yes If all of the above answers are "NO", then may proceed with Cephalosporin use.   . Tape Rash    HOME MEDICATIONS: Current Outpatient Medications  Medication Sig Dispense Refill  . ARIPiprazole (ABILIFY) 10 MG tablet Take 10 mg by mouth daily.    [name] atorvastatin (LIPITOR) 40 MG tablet TAKE ONE TABLET BY MOUTH ONCE DAILY. 90 tablet 1  . Continuous Blood Gluc Sensor (FREESTYLE LIBRE 2 SENSOR) MISC USE TO TEST BLOOD SUGAR 6 TIMES DAILY DX:e11.9 2 each 11  . cyclobenzaprine (FLEXERIL) 10 MG tablet Take 10 mg by mouth 3 (three) times daily as needed.    . diazepam (VALIUM) 5 MG tablet Take 10 mg by mouth in the morning and at bedtime.    . fluticasone (FLONASE) 50 MCG/ACT nasal spray Place 2 sprays into both nostrils daily. 16 g 5  . glipiZIDE (GLUCOTROL XL) 5 MG 24 hr tablet Take 1 tablet (5 mg total) by mouth every evening. Take with evening meal 30 tablet 2  .  glucose blood (CONTOUR NEXT TEST) test strip Test BS 4 times daily Dx E11.9 400 strip 3  . lamoTRIgine (LAMICTAL) 200 MG tablet Take 200 mg by mouth at bedtime. For mood disorder.    . levocetirizine (XYZAL) 5 MG tablet TAKE ONE TABLET BY MOUTH IN THE EVENING. 30 tablet 5  . lisinopril (ZESTRIL) 5 MG tablet Take 1 tablet (5 mg total) by mouth daily. 30 tablet 2  . metFORMIN (GLUCOPHAGE-XR) 500 MG 24 hr tablet Take 1 tablet (500 mg total) by mouth every  evening. Take with dinner 30 tablet 2  . OneTouch Delica Lancets 15Q MISC Use to test blood sugar twice daily. DX: E11.9 100 each 3  . oxyCODONE (ROXICODONE) 15 MG immediate release tablet Take 7.5 mg by mouth every 6 (six) hours as needed.     . pregabalin (LYRICA) 150 MG capsule Take 1 capsule (150 mg total) by mouth 2 (two) times daily. 60 capsule 2  . PREMARIN 0.3 MG tablet TAKE (1) TABLET BY MOUTH ONCE DAILY. 30 tablet 0  . temazepam (RESTORIL) 15 MG capsule Take 15 mg by mouth at bedtime.    [name] umeclidinium-vilanterol (ANORO ELLIPTA) 62.5-25 MCG/INH AEPB Inhale 1 puff into the lungs daily. 30 each 6  . valACYclovir (VALTREX) 500 MG tablet Take 500 mg by mouth daily as needed (shingles flare up).   4   No current facility-administered medications for this visit.    PAST MEDICAL HISTORY: Past Medical History:  Diagnosis Date  . Arthritis    hands and knees  . Bipolar 1 disorder ([place])   . Borderline diabetic   . Chronic back pain   . Chronic neck pain   . Complication of anesthesia    high anxiety-does not want to be alone  . COPD (chronic obstructive pulmonary disease) ([place])   . Current use of estrogen therapy 01/12/2014  . Depression   . Diabetes mellitus without complication ([place])   . GERD (gastroesophageal reflux disease)   . Headache   . Hot flashes 12/15/2013  . Hypertension   . IBS (irritable bowel syndrome)   . Incontinence   . Kidney stone   . 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 Panic attacks   . Peptic ulcer   . Peripheral neuropathy   . Rosacea   . Shingles   . Sleep apnea    uses a cpap-with oxygen    PAST SURGICAL HISTORY: Past Surgical History:  Procedure Laterality Date  . ABDOMINAL HYSTERECTOMY    . BUNIONECTOMY WITH HAMMERTOE RECONSTRUCTION Right 12/10/2012   Procedure: RIGHT FIRST METATARSAL CHEVRON BUNION CORRECTION,  2 AND 3 HAMMERTOE CORRECTION , RIGHT 3 AND 4 TOE NAIL EXCISION ;  Surgeon: Wylene Simmer, MD;  Location: [facility];   Service: Orthopedics;  Laterality: Right;  . FOOT ARTHRODESIS  2000   both feet  . LIPOSUCTION  03/2018  . RECTAL SURGERY    . TONSILLECTOMY      FAMILY HISTORY: Family History  Problem Relation Age of Onset  . Diabetes Mother   . Other Mother        vertigo; chronic eye disease  . COPD Father   . COPD Sister   . Alcohol abuse Brother   . Diabetes Maternal Grandmother   . Diabetes Maternal Grandfather   . Diabetes Sister   . Other Sister        hearing problems  . Hyperlipidemia Sister   . Alcohol abuse Brother   . COPD Brother   .  Alcohol abuse Brother   . Alcohol abuse Brother   . Other Brother        aneursym    SOCIAL HISTORY: Social History   Socioeconomic History  . Marital status: Divorced    Spouse name: Not on file  . Number of children: 5  . Years of education: 9th  . Highest education level: Not on file  Occupational History  . Occupation: Disabled  Tobacco Use  . Smoking status: Current Some Day Smoker    Packs/day: 0.50    Years: 35.00    Pack years: 17.50    Types: Cigarettes    Last attempt to quit: 10/19/2012    Years since quitting: 7.4  . Smokeless tobacco: Never Used  Vaping Use  . Vaping Use: Never used  Substance and Sexual Activity  . Alcohol use: No    Alcohol/week: 0.0 standard drinks  . Drug use: No  . Sexual activity: Never    Birth control/protection: Surgical    Comment: occ cigarette  Other Topics Concern  . Not on file  Social History Narrative   Lives at home with home with her son (has five children).   Right-handed.   1 cup caffeine per day.   Social Determinants of Health   Financial Resource Strain:   . Difficulty of Paying Living Expenses: Not on file  Food Insecurity: No Food Insecurity  . Worried About Charity fundraiser in the Last Year: Never true  . Ran Out of Food in the Last Year: Never true  Transportation Needs: No Transportation Needs  . Lack of Transportation (Medical): No  . Lack of  Transportation (Non-Medical): No  Physical Activity:   . Days of Exercise per Week: Not on file  . Minutes of Exercise per Session: Not on file  Stress:   . Feeling of Stress : Not on file  Social Connections:   . Frequency of Communication with Friends and Family: Not on file  . Frequency of Social Gatherings with Friends and Family: Not on file  . Attends Religious Services: Not on file  . Active Member of Clubs or Organizations: Not on file  . Attends Archivist Meetings: Not on file  . Marital Status: Not on file  Intimate Partner Violence:   . Fear of Current or Ex-Partner: Not on file  . Emotionally Abused: Not on file  . Physically Abused: Not on file  . Sexually Abused: Not on file     PHYSICAL EXAM   Vitals:   03/20/20 1404  Height: 5\' 9"  (1.753 m)   Not recorded     Body mass index is 25.4 kg/m.  PHYSICAL EXAMNIATION:  Gen: NAD, conversant, well nourised, well groomed                     Cardiovascular: Regular rate rhythm, no peripheral edema, warm, nontender. Eyes: Conjunctivae clear without exudates or hemorrhage Neck: Supple, no carotid bruits. Pulmonary: Clear to auscultation bilaterally   NEUROLOGICAL EXAM:  MENTAL STATUS: Speech:    Speech is normal; fluent and spontaneous with normal comprehension.  Cognition:     Orientation to time, place and person     Normal recent and remote memory     Normal Attention span and concentration     Normal Language, naming, repeating,spontaneous speech     Fund of knowledge   CRANIAL NERVES: CN II: Visual fields are full to confrontation. Pupils are round equal and briskly reactive to light. CN  III, IV, VI: extraocular movement are normal.  Cover and uncover showed no significant extraocular eye movement disorder, no ptosis. CN V: Facial sensation is intact to light touch CN VII: Face is symmetric with normal eye closure  CN VIII: Hearing is normal to causal conversation. CN IX, X: Phonation is  normal. CN XI: Head turning and shoulder shrug are intact  MOTOR: There is no pronator drift of out-stretched arms. Muscle bulk and tone are normal. Muscle strength is normal.  REFLEXES: Reflexes are 1 and symmetric at the biceps, triceps, knees, and ankles. Plantar responses are flexor.  SENSORY: Intact to light touch, pinprick and vibratory sensation are intact in fingers and toes.  COORDINATION: There is no trunk or limb dysmetria noted.  GAIT/STANCE: She needs push-up to get up from seated position, mildly unsteady, difficulty with tandem walking  DIAGNOSTIC DATA (LABS, IMAGING, TESTING) - I reviewed patient records, labs, notes, testing and imaging myself where available.   ASSESSMENT AND PLAN  KALIS FRIESE is a 64 y.o. female   Intermittent double vision  Differentiation diagnosis include neuromuscular junctional disorder, also need to rule out brainstem structural abnormality  Proceed with MRI of the brain  Acetylcholine receptor antibody  Return to clinic in 3 months  Marcial Pacas, M.D. Ph.D.  [facility] [address] [place], [place] [address] Ph: [phone] Fax: [phone]  CC:  Loman Brooklyn, [place],  [place] [address]  Loman Brooklyn, FNP

## 2020-03-21 ENCOUNTER — Telehealth: Payer: Self-pay | Admitting: Neurology

## 2020-03-21 NOTE — Telephone Encounter (Signed)
appt scheduled for tomorrow

## 2020-03-21 NOTE — Telephone Encounter (Signed)
unable to reach patient the mailbox is not set up [place]: [phone] (exp. 03/20/20 to 09/15/20)

## 2020-03-22 ENCOUNTER — Other Ambulatory Visit: Payer: Self-pay | Admitting: Family Medicine

## 2020-03-22 ENCOUNTER — Ambulatory Visit: Payer: BC Managed Care – PPO | Admitting: Pharmacist

## 2020-03-22 DIAGNOSIS — Z1231 Encounter for screening mammogram for malignant neoplasm of breast: Secondary | ICD-10-CM

## 2020-03-23 LAB — ACETYLCHOLINE RECEPTOR AB, ALL
AChR Binding Ab, Serum: <0.03 nmol/L (ref 0.00–0.24)
Acetylchol Block Ab: 13 % (ref 0–25)

## 2020-03-24 ENCOUNTER — Other Ambulatory Visit (HOSPITAL_COMMUNITY): Payer: Self-pay

## 2020-03-24 ENCOUNTER — Ambulatory Visit: Payer: BC Managed Care – PPO | Admitting: Physician Assistant

## 2020-03-24 ENCOUNTER — Telehealth: Payer: Self-pay | Admitting: Family Medicine

## 2020-03-24 DIAGNOSIS — Z87891 Personal history of nicotine dependence: Secondary | ICD-10-CM

## 2020-03-24 DIAGNOSIS — Z122 Encounter for screening for malignant neoplasm of respiratory organs: Secondary | ICD-10-CM

## 2020-03-24 NOTE — Telephone Encounter (Signed)
Your information has been submitted to [facility] [place]. Blue Will Heinkel [place] will review the request and notify you of the determination decision directly, typically within 72 hours of receiving all information.  You will also receive your request decision electronically. To check for an update later, open this request again from your dashboard.  If [facility] Tylisa [facility] [place] has not responded within the specified timeframe or if you have any questions about your PA submission, contact Blue Scotti [place]  directly at [phone].   

## 2020-03-24 NOTE — Progress Notes (Signed)
Patient scheduled for 12 month follow-up LDCT on 04/21/2020 at 10am. Patient aware.

## 2020-03-27 NOTE — Telephone Encounter (Signed)
I spoke to the patient she is scheduled at [facility] For 03/28/20.

## 2020-03-28 ENCOUNTER — Other Ambulatory Visit: Payer: Self-pay

## 2020-03-28 ENCOUNTER — Ambulatory Visit (INDEPENDENT_AMBULATORY_CARE_PROVIDER_SITE_OTHER): Payer: BC Managed Care – PPO

## 2020-03-28 ENCOUNTER — Telehealth: Payer: Self-pay | Admitting: Neurology

## 2020-03-28 ENCOUNTER — Telehealth: Payer: Self-pay | Admitting: Pharmacist

## 2020-03-28 DIAGNOSIS — H532 Diplopia: Secondary | ICD-10-CM | POA: Diagnosis not present

## 2020-03-28 MED ORDER — DEXCOM G6 TRANSMITTER MISC: 3 refills | Status: DC

## 2020-03-28 MED ORDER — DEXCOM G6 SENSOR MISC: 3 refills | Status: DC

## 2020-03-28 NOTE — Telephone Encounter (Signed)
Deniedon October 18 Drug FreeStyle Wink 2 Sensor Form [employer] Form (CB) Original Claim Info

## 2020-03-28 NOTE — Telephone Encounter (Signed)
   wr PA submitted via cover my meds for Dexcom (3 parts: receiver, transmitter and sensors)

## 2020-03-28 NOTE — Telephone Encounter (Signed)
Attempted to call patient to discuss denial Will call in dexcom to see if covered  PA likely required

## 2020-03-29 ENCOUNTER — Ambulatory Visit: Payer: BC Managed Care – PPO | Admitting: Pharmacist

## 2020-03-30 ENCOUNTER — Telehealth: Payer: Self-pay | Admitting: *Deleted

## 2020-03-30 NOTE — Telephone Encounter (Signed)
Left patient a detailed message, with results, on voicemail (ok per DPR).  Provided our number to call back with any questions.  

## 2020-03-30 NOTE — Telephone Encounter (Signed)
-----   Message from Marcial Pacas, MD sent at 03/30/2020  1:14 PM EDT ----- Please call pt for normal MRI brain.

## 2020-03-31 NOTE — Telephone Encounter (Signed)
PA submitted via cover my meds for Dexcom (3 parts: receiver, transmitter and sensors)-   DENIED

## 2020-04-03 ENCOUNTER — Other Ambulatory Visit: Payer: Self-pay | Admitting: Gastroenterology

## 2020-04-03 ENCOUNTER — Telehealth: Payer: Self-pay | Admitting: Pharmacist

## 2020-04-03 ENCOUNTER — Other Ambulatory Visit: Payer: Self-pay | Admitting: Family Medicine

## 2020-04-03 DIAGNOSIS — Z86010 Personal history of colonic polyps: Secondary | ICD-10-CM | POA: Diagnosis not present

## 2020-04-03 DIAGNOSIS — Z79899 Other long term (current) drug therapy: Secondary | ICD-10-CM

## 2020-04-03 DIAGNOSIS — N952 Postmenopausal atrophic vaginitis: Secondary | ICD-10-CM

## 2020-04-03 DIAGNOSIS — K219 Gastro-esophageal reflux disease without esophagitis: Secondary | ICD-10-CM | POA: Diagnosis not present

## 2020-04-03 MED ORDER — FLUCONAZOLE 150 MG PO TABS: ORAL_TABLET | ORAL | 0 refills | Status: DC

## 2020-04-03 NOTE — Telephone Encounter (Signed)
Will not seek PA approval Will try to appeal libre--patient can also pay $75 out of pocket for libre at pharmacy

## 2020-04-03 NOTE — Telephone Encounter (Signed)
Having coffee with creamer--reports her sugar is going up Having blueberry/strawberry pancakes Current BG 120 Today 10/25-123, 216 (8:16am) 10/24-109, 152, 117 10/23-242, 143, 128  Patient eating a lot more fruits now/eating breakfast Will move DM medications to AM    Also reports yeast infection from recent antibiotic

## 2020-04-05 ENCOUNTER — Telehealth: Payer: Self-pay

## 2020-04-05 NOTE — Telephone Encounter (Signed)
Attempted to contact - NVM

## 2020-04-05 NOTE — Telephone Encounter (Signed)
Pt called stating that we have been treating her over the past few months for allergies/sinus issues. Pt says the antibiotic that was sent in for her is not working and pt is still sick. Was recommended by the pharmacist to call her PCP and request a stronger antibiotic. Pt says she cant even sleep at night because her nose wont stop running and shes coughing and she feels so bad. Pt says she has tried taking all kinds of OTC medicine and nothing is helping.  Please advise.

## 2020-04-05 NOTE — Telephone Encounter (Signed)
Have her seen in office for IN person check.

## 2020-04-05 NOTE — Telephone Encounter (Signed)
Patient last had a tele visit on 10/7 with PCP- please review and advise

## 2020-04-06 ENCOUNTER — Telehealth (INDEPENDENT_AMBULATORY_CARE_PROVIDER_SITE_OTHER): Payer: BC Managed Care – PPO | Admitting: Pharmacist

## 2020-04-06 DIAGNOSIS — E119 Type 2 diabetes mellitus without complications: Secondary | ICD-10-CM

## 2020-04-06 NOTE — Progress Notes (Signed)
     04/06/2020 Name: Cynthia Deleon MRN: [id number] DOB: 23-Jan-1956   S:  73 yoF Presents for diabetes evaluation, education, and management Patient was referred and last seen by Primary Care Provider on 8  Patient visit virtually in the context of Covid-19 pandemic.   I connected with [name] on 04/06/20 at 10am by video and verified that I am speaking with the correct person using two identifiers.   I discussed the limitations, risks, security and privacy concerns of performing an evaluation and management service by video and the availability of in person appointments. I also discussed with the patient that there may be a patient responsible charge related to this service. The patient expressed understanding and agreed to proceed.   Patient location:  work My Location:  PCP office  Persons on the video call:  2  Insurance coverage/medication affordability: BCBS commercial  Patient reports adherence with medications.  Current diabetes medications include: metformin, glipizide  Current hypertension medications include: n/a Goal 130/80 Current hyperlipidemia medications include: atorvastatin   Patient denies hypoglycemic events.   Patient reported dietary habits: Eats 2-3 meals/day Patient is eating more steady meals now, her hypoglycemia has resolved Lunch: corn dog, mixed veggies Dinner: pintos, greens, meat Breakfast: Strawberries/pancakes (no syrup) Patient-reported exercise habits: actively working/walking  O:  Lab Results  Component Value Date   HGBA1C 6.8 03/07/2020   Lipid Panel     Component Value Date/Time   CHOL 152 03/07/2020 0852   TRIG 205 (H) 03/07/2020 0852   HDL 39 (L) 03/07/2020 0852   CHOLHDL 3.9 03/07/2020 0852   CHOLHDL 4.1 11/16/2015 0715   VLDL 48 (H) 11/16/2015 0715   LDLCALC 79 03/07/2020 0852    Home fasting blood sugars: 100-130  2 hour post-meal/random blood sugars: <200.   A/P:  Diabetes T2DM, A1c has increased  to 6.8%.  Patient wearing [name], however encouraged to bring device to next appt.  Patient is able to verbalize appropriate hypoglycemia management plan. Patient is adherent with medication. Control is suboptimal due to diet/lifestyle.  -Continue metformin as prescribed.  -Continue glipizide  -Continue CGM libre2 to determine patient's blood sugars throughout the day.  Patient's insurance does not want to cover since patient is not on insulin.  Will work to get appeal  -Would like to get patient on SGLT2, will explore at next visit  -Libre to be addressed at next visit, will change patient sensor  -Extensively discussed pathophysiology of diabetes, recommended lifestyle interventions, dietary effects on blood sugar control  -Counseled on s/sx of and management of hypoglycemia  -Next A1C anticipated 3 months.   Written patient instructions provided.  Total time in counseling 20 minutes.   Follow up PCP Clinic Visit ON 3 months.    Regina Eck, PharmD, BCPS Clinical Pharmacist, [facility]  [facility] Phone [phone]

## 2020-04-07 ENCOUNTER — Ambulatory Visit (INDEPENDENT_AMBULATORY_CARE_PROVIDER_SITE_OTHER): Payer: BC Managed Care – PPO | Admitting: Pharmacist

## 2020-04-07 ENCOUNTER — Other Ambulatory Visit: Payer: Self-pay

## 2020-04-07 DIAGNOSIS — E119 Type 2 diabetes mellitus without complications: Secondary | ICD-10-CM

## 2020-04-07 NOTE — Progress Notes (Signed)
° ° °  04/07/2020 Name: Cynthia Deleon MRN: [id number] DOB: Oct 06, 1955   S:  64 yoFPresents for diabetes evaluation, education, and management Patient was referred and last seen by Primary Care Provider on10/7/21.  Patient is here for libre CGM interpretation  Insurance coverage/medication affordability:BCBS Agricultural engineer with medications.  Current diabetes medications include:metformin, glipizide  Current hypertension medications include:n/a Goal 130/80 Current hyperlipidemia medications include:atorvastatin   Patient denies hypoglycemic events.   Patient reported dietary habits: Eats 2-3 meals/day Patient is eating more steady meals now, her hypoglycemia has resolved Lunch: corn dog, mixed veggies Dinner: pintos, greens, meat Breakfast: Strawberries/pancakes (no syrup) Patient-reported exercise habits: actively working/walkingO:  Lab Results  Component Value Date   HGBA1C 6.8 03/07/2020    There were no vitals filed for this visit.     Lipid Panel     Component Value Date/Time   CHOL 152 03/07/2020 0852   TRIG 205 (H) 03/07/2020 0852   HDL 39 (L) 03/07/2020 0852   CHOLHDL 3.9 03/07/2020 0852   CHOLHDL 4.1 11/16/2015 0715   VLDL 48 (H) 11/16/2015 0715   LDLCALC 79 03/07/2020 0852        A/P:  Diabetes T2DM , glycemic control improved.  Patient has only had one low.  We have moved her glipizde XL to morning with breakfast.  She is eating more steady/carb-controlled meals. Patient is able to verbalize appropriate hypoglycemia management plan.  Patient's time in therapeutic range is 86% and has vastly improved.  [name] view data interpreted.  NO changes to current regimen.  Will likely add SGLT2 to regimen at next visit.  Patient is adherent with medication.   New libre sensor applied   -Extensively discussed pathophysiology of diabetes, recommended lifestyle interventions, dietary effects on blood sugar control  -Counseled on s/sx  of and management of hypoglycemia  -Next A1C anticipated December 2021.  Written patient instructions provided.  Total time in face to face counseling 30 minutes.   Follow up Pharmacist Clinic Visit in 2 weeks.    Regina Eck, PharmD, BCPS Clinical Pharmacist, [place]  [facility] Phone [phone]

## 2020-04-10 DIAGNOSIS — J449 Chronic obstructive pulmonary disease, unspecified: Secondary | ICD-10-CM | POA: Diagnosis not present

## 2020-04-12 ENCOUNTER — Ambulatory Visit: Payer: BC Managed Care – PPO | Admitting: Physician Assistant

## 2020-04-12 ENCOUNTER — Other Ambulatory Visit: Payer: Self-pay

## 2020-04-12 ENCOUNTER — Ambulatory Visit: Payer: BC Managed Care – PPO | Admitting: Pharmacist

## 2020-04-12 DIAGNOSIS — I10 Essential (primary) hypertension: Secondary | ICD-10-CM

## 2020-04-12 DIAGNOSIS — E119 Type 2 diabetes mellitus without complications: Secondary | ICD-10-CM

## 2020-04-12 NOTE — Chronic Care Management (AMB) (Signed)
Chronic Care Management   Follow Up Note   04/12/2020 Name: HANIAH PENNY     MRN: [id number]       DOB: [birth year]  Referred by: Loman Brooklyn, FNP Reason for referral : Chronic Care Management (PharmD follow up)   SHADAWN HANAWAY is a 64 y.o. year old female who is a primary care patient of Loman Brooklyn, FNP. The CCM team was consulted for assistance with chronic disease management and care coordination needs. Patient was contacted via telephone for today's visit regarding her diabetes and chronic care management  Review of patient status, including review of consultants reports, relevant laboratory and other test results, and collaboration with appropriate care team members and the patient's provider was performed as part of comprehensive patient evaluation and provision of chronic care management services.  SDOH (Social Determinants of Health) assessments performed: No See Care Plan activities for detailed interventions related to [facility]    Current Outpatient Medications on File Prior to Visit  Medication Sig Dispense Refill  . ARIPiprazole (ABILIFY) 10 MG tablet Take 10 mg by mouth daily.    [name] atorvastatin (LIPITOR) 40 MG tablet TAKE ONE TABLET BY MOUTH ONCE DAILY. 90 tablet 1  . cyclobenzaprine (FLEXERIL) 10 MG tablet Take 10 mg by mouth 3 (three) times daily as needed.    . diazepam (VALIUM) 5 MG tablet Take 5 mg by mouth in the morning and at bedtime.     . fluconazole (DIFLUCAN) 150 MG tablet Take 1 tablet by mouth today.  Repeat on day 3 if symptoms persist. 2 tablet 0  . fluticasone (FLONASE) 50 MCG/ACT nasal spray Place 2 sprays into both nostrils daily. 16 g 5  . glipiZIDE (GLUCOTROL XL) 5 MG 24 hr tablet Take 1 tablet (5 mg total) by mouth every evening. Take with evening meal 30 tablet 2  . glucose blood (CONTOUR NEXT TEST) test strip Test BS 4 times daily Dx E11.9 400 strip 3  . lamoTRIgine (LAMICTAL) 200 MG tablet Take 200 mg by mouth at bedtime. For  mood disorder.    . levocetirizine (XYZAL) 5 MG tablet TAKE ONE TABLET BY MOUTH IN THE EVENING. 30 tablet 5  . lisinopril (ZESTRIL) 5 MG tablet Take 1 tablet (5 mg total) by mouth daily. 30 tablet 2  . metFORMIN (GLUCOPHAGE-XR) 500 MG 24 hr tablet Take 1 tablet (500 mg total) by mouth every evening. Take with dinner 30 tablet 2  . OneTouch Delica Lancets 40J MISC Use to test blood sugar twice daily. DX: E11.9 100 each 3  . oxyCODONE (ROXICODONE) 15 MG immediate release tablet Take 7.5 mg by mouth every 6 (six) hours as needed.     . pregabalin (LYRICA) 150 MG capsule Take 1 capsule (150 mg total) by mouth 2 (two) times daily. 60 capsule 2  . PREMARIN 0.3 MG tablet TAKE (1) TABLET BY MOUTH ONCE DAILY. 30 tablet 7  . temazepam (RESTORIL) 15 MG capsule Take 15 mg by mouth at bedtime.    [name] umeclidinium-vilanterol (ANORO ELLIPTA) 62.5-25 MCG/INH AEPB Inhale 1 puff into the lungs daily. 30 each 6  . valACYclovir (VALTREX) 500 MG tablet Take 500 mg by mouth daily as needed (shingles flare up).   4   No current facility-administered medications on file prior to visit.   Lab Results  Component Value Date   HGBA1C 6.8 03/07/2020    Lipid Panel     Component Value Date/Time   CHOL 152 03/07/2020 0852   TRIG 205 (  H) 03/07/2020 0852   HDL 39 (L) 03/07/2020 0852   CHOLHDL 3.9 03/07/2020 0852   CHOLHDL 4.1 11/16/2015 0715   VLDL 48 (H) 11/16/2015 0715   LDLCALC 79 03/07/2020 0852     Goals Addressed              This Visit's Progress     Patient Stated   .  I would like to control my blood sugars (pt-stated)        CARE PLAN ENTRY (see longitudinal plan of care for additional care plan information)   Current Barriers:  . Social, financial, community barriers:  . Diabetes: M2NO; complicated by chronic medical conditions including bipolar, most recent A1c 6.8% . Most recent eGFR: 85 . Current antihyperglycemic regimen: metformin, glipizide due to cost and current pill packaging (pt  has medications packaged monthly by [facility] in [place], [place])  Patient using Freestyle Libre2 CGM system . Denies hypoglycemic symptoms . Denies hyperglycemic symptoms . Current meal patterns: o Breakfast: pancakes (no syrup), fresh fruit--does try to limit 30-45 carbs per meal . Patient eating breakfast now--she was previously only eating dinner and experiencing lows o Lunch: veggies, sandwiches o Supper: veggies, protein (chicken), mac and cheese/carb o Snacks:not a snacker o Drinks: water, sugar free propel . Current exercise: volunteers in [place], [place]--stays very active, walks . Current blood glucose readings:  post prandials <180, FBG ~140-150, as low as 100 but not often . Cardiovascular risk reduction: o Current hypertensive regimen: o Current hyperlipidemia regimen:  o Current antiplatelet regimen: . Eye exam: current annually  Pharmacist Clinical Goal(s):  [name] Over the next 90 days, patient will work with PharmD and primary care provider to address hyperglycemia o Patient has been compliant with Libre2 CGM, her sensor will run out in 10 days, appt scheduled to apply next sensor  Interventions: . Comprehensive medication review performed, medication list updated in electronic medical record . Inter-disciplinary care team collaboration (see longitudinal plan of care) . Patient also complaining of potential CPAP infection due to a defective CPAP SO-clean sanitizing system that has been recalled.  Appt scheduled with PCP to address o Patient has been compliant with Libre2 CGM, her sensor will run out in 10 days, appt scheduled to apply next sensor o Libre2 CGM 145, patient reports fingersticks are lower in the 70-80s - Encouraged patient to go with libre readings, she denies signs/symptoms of hypoglycemia  Patient Self Care Activities:  . Patient will check/scan blood glucose using FreeStyle Libre 2 CGM at least 6 times daily (patient uses reader, phone not compatible)    . Patient will focus on medication adherence by continuing to use pill packs . Patient will take medications as prescribed . Patient will contact provider with any episodes of hypoglycemia . Patient will report any questions or concerns to provider         Time:  Time spent counseling patient: 15 Additional time spent on charting: 15  Plan:  -Continue medications as prescribed, continue diet/lifestyle modifications for diabetes -The care management team will reach out to the patient again over the next 14 days.    Regina Eck, PharmD, BCPS Clinical Pharmacist, [place]  II Phone [phone]

## 2020-04-14 DIAGNOSIS — M4716 Other spondylosis with myelopathy, lumbar region: Secondary | ICD-10-CM | POA: Diagnosis not present

## 2020-04-14 DIAGNOSIS — M47816 Spondylosis without myelopathy or radiculopathy, lumbar region: Secondary | ICD-10-CM | POA: Diagnosis not present

## 2020-04-14 DIAGNOSIS — M5136 Other intervertebral disc degeneration, lumbar region: Secondary | ICD-10-CM | POA: Diagnosis not present

## 2020-04-14 DIAGNOSIS — M5030 Other cervical disc degeneration, unspecified cervical region: Secondary | ICD-10-CM | POA: Diagnosis not present

## 2020-04-14 DIAGNOSIS — M25562 Pain in left knee: Secondary | ICD-10-CM | POA: Diagnosis not present

## 2020-04-18 ENCOUNTER — Ambulatory Visit
Admission: RE | Admit: 2020-04-18 | Discharge: 2020-04-18 | Disposition: A | Discharge status: Home / Self Care | Payer: BC Managed Care – PPO | Source: Ambulatory Visit | Attending: Family Medicine | Admitting: Family Medicine

## 2020-04-18 ENCOUNTER — Other Ambulatory Visit: Payer: Self-pay

## 2020-04-18 DIAGNOSIS — Z1231 Encounter for screening mammogram for malignant neoplasm of breast: Secondary | ICD-10-CM | POA: Diagnosis not present

## 2020-04-18 NOTE — Progress Notes (Signed)
Attempted to obtain medical history via telephone, unable to reach at this time. I left a voicemail to return pre surgical testing department's phone call.  

## 2020-04-21 ENCOUNTER — Encounter: Payer: Self-pay | Admitting: Family Medicine

## 2020-04-21 ENCOUNTER — Ambulatory Visit (INDEPENDENT_AMBULATORY_CARE_PROVIDER_SITE_OTHER): Payer: BC Managed Care – PPO | Admitting: Pharmacist

## 2020-04-21 ENCOUNTER — Other Ambulatory Visit: Payer: Self-pay

## 2020-04-21 ENCOUNTER — Ambulatory Visit (INDEPENDENT_AMBULATORY_CARE_PROVIDER_SITE_OTHER): Payer: BC Managed Care – PPO | Admitting: Family Medicine

## 2020-04-21 ENCOUNTER — Ambulatory Visit (HOSPITAL_COMMUNITY): Admission: RE | Admit: 2020-04-21 | Payer: BC Managed Care – PPO | Source: Ambulatory Visit

## 2020-04-21 VITALS — BP 106/72 | HR 98 | Temp 98.2°F | Resp 20 | Ht 69.0 in | Wt 170.0 lb

## 2020-04-21 DIAGNOSIS — B370 Candidal stomatitis: Secondary | ICD-10-CM

## 2020-04-21 DIAGNOSIS — N3000 Acute cystitis without hematuria: Secondary | ICD-10-CM | POA: Diagnosis not present

## 2020-04-21 DIAGNOSIS — E1122 Type 2 diabetes mellitus with diabetic chronic kidney disease: Secondary | ICD-10-CM

## 2020-04-21 DIAGNOSIS — R300 Dysuria: Secondary | ICD-10-CM | POA: Diagnosis not present

## 2020-04-21 DIAGNOSIS — G4700 Insomnia, unspecified: Secondary | ICD-10-CM | POA: Diagnosis not present

## 2020-04-21 DIAGNOSIS — N183 Chronic kidney disease, stage 3 unspecified: Secondary | ICD-10-CM

## 2020-04-21 LAB — MICROSCOPIC EXAMINATION: RBC, Urine: NONE SEEN /hpf (ref 0–2)

## 2020-04-21 LAB — URINALYSIS, COMPLETE
Bilirubin, UA: NEGATIVE
Ketones, UA: NEGATIVE
Nitrite, UA: POSITIVE — AB
Protein,UA: NEGATIVE
Specific Gravity, UA: 1.015 (ref 1.005–1.030)
Urobilinogen, Ur: 0.2 mg/dL (ref 0.2–1.0)
pH, UA: 6 (ref 5.0–7.5)

## 2020-04-21 MED ORDER — CEPHALEXIN 500 MG PO CAPS: 500.0000 mg | ORAL_CAPSULE | Freq: Two times a day (BID) | ORAL | 0 refills | Status: AC

## 2020-04-21 MED ORDER — FLUCONAZOLE 150 MG PO TABS: 150.0000 mg | ORAL_TABLET | Freq: Once | ORAL | 0 refills | Status: AC

## 2020-04-21 MED ORDER — NYSTATIN 100000 UNIT/ML MT SUSP: 5.0000 mL | Freq: Four times a day (QID) | OROMUCOSAL | 0 refills | Status: DC

## 2020-04-21 MED ORDER — SITAGLIPTIN PHOSPHATE 100 MG PO TABS: 100.0000 mg | ORAL_TABLET | Freq: Every day | ORAL | 3 refills | Status: DC

## 2020-04-21 NOTE — Patient Instructions (Signed)

## 2020-04-21 NOTE — Telephone Encounter (Signed)
Encounter closed, patient had appointment today with Marjorie Smolder.

## 2020-04-21 NOTE — Progress Notes (Signed)
    04/21/2020 Name: Cynthia Deleon MRN: [id number] DOB: Oct 22, 1955   S:  43 yoF Presents for diabetes evaluation, education, and management Patient was referred and last seen by Primary Care Provider on 04/21/20.  Insurance coverage/medication affordability: [employer], commercial  Patient reports adherence with medications.  Current diabetes medications include:metformin, glipizide  Current hypertension medications include:lisinopril Goal 130/80 Current hyperlipidemia medications include:atorvastatin   Patient reports hypoglycemic events. Patient reported dietary habits: Eats2-72meals/day Patient is eating more steady meals now Lunch: corn dog, mixed veggies Dinner: pintos, greens,meat Breakfast: Strawberries/pancakes(no syrup) Patient-reported exercise habits:actively working/walking   O:  Lab Results  Component Value Date   HGBA1C 6.8 03/07/2020   Lipid Panel     Component Value Date/Time   CHOL 152 03/07/2020 0852   TRIG 205 (H) 03/07/2020 0852   HDL 39 (L) 03/07/2020 0852   CHOLHDL 3.9 03/07/2020 0852   CHOLHDL 4.1 11/16/2015 0715   VLDL 48 (H) 11/16/2015 0715   LDLCALC 79 03/07/2020 0852      A/P:  Diabetes T2DM, patient experiencing several lows and highs.  Attempting to correct patient diet.  Will discuss d/c'ing glipizide and adding Januvia, but awaiting patient's insurance change. Patient is able to verbalize appropriate hypoglycemia management plan. Patient is adherent with medication.   -Continue Metformin/glipizide for now  -Consider DPP4 in January with new insurance; will d/c glipizide if covered  -Extensively discussed pathophysiology of diabetes, recommended lifestyle interventions, dietary effects on blood sugar control  -Counseled on s/sx of and management of hypoglycemia  -Next A1C anticipated 3 months.  Written patient instructions provided.  Total time in face to face counseling 30 minutes.   Follow up PCP Clinic Visit  in 3 months.    Regina Eck, PharmD, BCPS Clinical Pharmacist, [place]  II Phone [phone]

## 2020-04-21 NOTE — Progress Notes (Signed)
Subjective: CC: dysuria, insomnia PCP: Loman Brooklyn, FNP  [id number] K Rape is a 64 y.o. female presenting to clinic today for:  1. Dysuria Cynthia Deleon reports dysuria and lower abdominal pain for 3 weeks. She denies flank pain, urgency, hematuria, frequency, or fever. Denies nausea or vomiting. She has a history of recurrent UTIs. She gets vaginal and oral yeast infections with antibiotics. She reports an allergy to amoxicillin and reports that her reaction is yeast infections.   2. Insomnia Cynthia Deleon reports difficulty falling asleep and staying asleep. She will fall asleep around 9 pm but then wake up around midnight and not be able to fall back asleep. She has been taking gabapentin but reports that this does not help. She has failed buspar in the past. She reports belsomra helps her sleep. This is prescribed by her psychiatrist and she has two refills on this however her pharmacy told her she needed prior authorization before they could fill it.    Relevant past medical, surgical, family, and social history reviewed and updated as indicated.  Allergies and medications reviewed and updated.  Allergies  Allergen Reactions   Chantix [Varenicline] Other (See Comments)    Altered mental status-Per patient was put on allergy list by Dr Lorriane Shire bu patient staes she is not allergic to this and is currently taking it.    Divalproex Sodium Other (See Comments)    Hallucinations  Other reaction(s): Confusion   Pseudoeph-Hydrocodone-Gg Nausea Only   Sulfa Antibiotics Anaphylaxis and Nausea Only   Varenicline Tartrate     Other reaction(s): Confusion   Ambien [Zolpidem] Other (See Comments)    Causes sleep walking   Codeine Nausea And Vomiting   Hydrocodone Nausea And Vomiting   Paxil [Paroxetine Hcl] Other (See Comments)    Causes ringing in the ears.    Sulfur Other (See Comments)    Swelling of tongue   Amoxicillin Rash    Has patient had a PCN reaction causing immediate  rash, facial/tongue/throat swelling, SOB or lightheadedness with hypotension: No Has patient had a PCN reaction causing severe rash involving mucus membranes or skin necrosis: No Has patient had a PCN reaction that required hospitalization: No Has patient had a PCN reaction occurring within the last 10 years: Yes If all of the above answers are "NO", then may proceed with Cephalosporin use.    Tape Rash   Past Medical History:  Diagnosis Date   Arthritis    hands and knees   Bipolar 1 disorder ([place])    Borderline diabetic    Chronic back pain    Chronic neck pain    Complication of anesthesia    high anxiety-does not want to be alone   COPD (chronic obstructive pulmonary disease) (HCC)    Current use of estrogen therapy 01/12/2014   Depression    Diabetes mellitus without complication (HCC)    Diplopia    GERD (gastroesophageal reflux disease)    Headache    Hot flashes 12/15/2013   Hypertension    IBS (irritable bowel syndrome)    Incontinence    Kidney stone    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HCC)    Panic attacks    Peptic ulcer    Peripheral neuropathy    Rosacea    Shingles    Sleep apnea    uses a cpap-with oxygen    Current Outpatient Medications:    ARIPiprazole (ABILIFY) 10 MG tablet, Take 10 mg by mouth daily., Disp: , Rfl:    atorvastatin (  LIPITOR) 40 MG tablet, TAKE ONE TABLET BY MOUTH ONCE DAILY., Disp: 90 tablet, Rfl: 1   diazepam (VALIUM) 5 MG tablet, Take 5 mg by mouth in the morning and at bedtime. , Disp: , Rfl:    fluticasone (FLONASE) 50 MCG/ACT nasal spray, Place 2 sprays into both nostrils daily., Disp: 16 g, Rfl: 5   glipiZIDE (GLUCOTROL XL) 5 MG 24 hr tablet, Take 1 tablet (5 mg total) by mouth every evening. Take with evening meal, Disp: 30 tablet, Rfl: 2   glucose blood (CONTOUR NEXT TEST) test strip, Test BS 4 times daily Dx E11.9, Disp: 400 strip, Rfl: 3   lamoTRIgine (LAMICTAL) 200 MG tablet, Take 200 mg by mouth  at bedtime. For mood disorder., Disp: , Rfl:    levocetirizine (XYZAL) 5 MG tablet, TAKE ONE TABLET BY MOUTH IN THE EVENING., Disp: 30 tablet, Rfl: 5   lisinopril (ZESTRIL) 5 MG tablet, Take 1 tablet (5 mg total) by mouth daily., Disp: 30 tablet, Rfl: 2   metFORMIN (GLUCOPHAGE-XR) 500 MG 24 hr tablet, Take 1 tablet (500 mg total) by mouth every evening. Take with dinner, Disp: 30 tablet, Rfl: 2   OneTouch Delica Lancets 81O MISC, Use to test blood sugar twice daily. DX: E11.9, Disp: 100 each, Rfl: 3   oxyCODONE (ROXICODONE) 15 MG immediate release tablet, Take 7.5 mg by mouth every 6 (six) hours as needed. , Disp: , Rfl:    pregabalin (LYRICA) 150 MG capsule, Take 1 capsule (150 mg total) by mouth 2 (two) times daily., Disp: 60 capsule, Rfl: 2   PREMARIN 0.3 MG tablet, TAKE (1) TABLET BY MOUTH ONCE DAILY., Disp: 30 tablet, Rfl: 7   temazepam (RESTORIL) 15 MG capsule, Take 15 mg by mouth at bedtime., Disp: , Rfl:    umeclidinium-vilanterol (ANORO ELLIPTA) 62.5-25 MCG/INH AEPB, Inhale 1 puff into the lungs daily., Disp: 30 each, Rfl: 6   valACYclovir (VALTREX) 500 MG tablet, Take 500 mg by mouth daily as needed (shingles flare up). , Disp: , Rfl: 4   cyclobenzaprine (FLEXERIL) 10 MG tablet, Take 10 mg by mouth 3 (three) times daily as needed. (Patient not taking: Reported on 04/21/2020), Disp: , Rfl:  Social History   Socioeconomic History   Marital status: Divorced    Spouse name: Not on file   Number of children: 5   Years of education: 9th   Highest education level: Not on file  Occupational History   Occupation: Disabled  Tobacco Use   Smoking status: Current Some Day Smoker    Packs/day: 0.50    Years: 35.00    Pack years: 17.50    Types: Cigarettes    Last attempt to quit: 10/19/2012    Years since quitting: 7.5   Smokeless tobacco: Never Used  Vaping Use   Vaping Use: Never used  Substance and Sexual Activity   Alcohol use: No    Alcohol/week: 0.0 standard  drinks   Drug use: No   Sexual activity: Never    Birth control/protection: Surgical    Comment: occ cigarette  Other Topics Concern   Not on file  Social History Narrative   Lives at home with home with her son (has five children).   Right-handed.   1 cup caffeine per day.   Social Determinants of Health   Financial Resource Strain:    Difficulty of Paying Living Expenses: Not on file  Food Insecurity: No Food Insecurity   Worried About [place] in the Last Year:  Never true   Ran Out of Food in the Last Year: Never true  Transportation Needs: No Transportation Needs   Lack of Transportation (Medical): No   Lack of Transportation (Non-Medical): No  Physical Activity:    Days of Exercise per Week: Not on file   Minutes of Exercise per Session: Not on file  Stress:    Feeling of Stress : Not on file  Social Connections:    Frequency of Communication with Friends and Family: Not on file   Frequency of Social Gatherings with Friends and Family: Not on file   Attends Religious Services: Not on file   Active Member of Clubs or Organizations: Not on file   Attends Archivist Meetings: Not on file   Marital Status: Not on file  Intimate Partner Violence:    Fear of Current or Ex-Partner: Not on file   Emotionally Abused: Not on file   Physically Abused: Not on file   Sexually Abused: Not on file   Family History  Problem Relation Age of Onset   Diabetes Mother    Other Mother        vertigo; chronic eye disease   COPD Father    COPD Sister    Alcohol abuse Brother    Diabetes Maternal Grandmother    Diabetes Maternal Grandfather    Diabetes Sister    Other Sister        hearing problems   Hyperlipidemia Sister    Alcohol abuse Brother    COPD Brother    Alcohol abuse Brother    Alcohol abuse Brother    Other Brother        aneursym    Review of Systems  Per HPI.  Objective: Office vital signs  reviewed. BP 106/72    Pulse 98    Temp 98.2 F (36.8 C) (Temporal)    Resp 20    Ht 5\' 9"  (1.753 m)    Wt 170 lb (77.1 kg)    SpO2 99%    BMI 25.10 kg/m   Physical Examination:  Physical Exam Vitals and nursing note reviewed.  Constitutional:      General: She is not in acute distress.    Appearance: She is normal weight. She is not ill-appearing, toxic-appearing or diaphoretic.  Cardiovascular:     Rate and Rhythm: Normal rate and regular rhythm.     Heart sounds: Normal heart sounds. No murmur heard.   Pulmonary:     Effort: Pulmonary effort is normal. No respiratory distress.     Breath sounds: Normal breath sounds.  Abdominal:     General: Bowel sounds are normal. There is no distension.     Palpations: Abdomen is soft.     Tenderness: There is no abdominal tenderness. There is no right CVA tenderness, left CVA tenderness, guarding or rebound.  Musculoskeletal:     Right lower leg: No edema.     Left lower leg: No edema.  Neurological:     General: No focal deficit present.     Mental Status: She is alert and oriented to person, place, and time.  Psychiatric:        Mood and Affect: Mood normal.      Results for orders placed or performed in visit on 03/20/20  Acetylcholine Receptor Ab, All  Result Value Ref Range   AChR Binding Ab, Serum <0.03 0.00 - 0.24 nmol/L   Acetylchol Block Ab 13 0 - 25 %   Acetylcholine Modulat Ab Comment  0 - 20 %     Assessment/ Plan: Cynthia Deleon was seen today for dysuria and insomnia.  Diagnoses and all orders for this visit:  Dysuria -     Urinalysis, Complete -     Urine Culture -     Microscopic Examination  Acute cystitis without hematuria UA positive for nitrates and bacteria. Culture pending. Diflucan for prophylaxis.  -     cephALEXin (KEFLEX) 500 MG capsule; Take 1 capsule (500 mg total) by mouth 2 (two) times daily for 7 days. -     fluconazole (DIFLUCAN) 150 MG tablet; Take 1 tablet (150 mg total) by mouth once for 1  dose.  Oral yeast infection Rx provided in case patient develops oral yeast with abx.  -     nystatin (MYCOSTATIN) 100000 UNIT/ML suspension; Take 5 mLs (500,000 Units total) by mouth 4 (four) times daily.  Insomnia Instructed to call psychiatrist office for prescription refill since he is the prescribing provider.   Return to office for new or worsening symptoms, or if symptoms persist.    The above assessment and management plan was discussed with the patient. The patient verbalized understanding of and has agreed to the management plan. Patient is aware to call the clinic if symptoms persist or worsen. Patient is aware when to return to the clinic for a follow-up visit. Patient educated on when it is appropriate to go to the emergency department.   Marjorie Smolder, FNP-C [facility] [address] [place], [place] [address] [phone]

## 2020-04-24 ENCOUNTER — Encounter (HOSPITAL_COMMUNITY): Payer: Self-pay

## 2020-04-24 ENCOUNTER — Other Ambulatory Visit (HOSPITAL_COMMUNITY): Payer: BC Managed Care – PPO | Attending: Gastroenterology

## 2020-04-26 LAB — URINE CULTURE

## 2020-04-27 ENCOUNTER — Ambulatory Visit (HOSPITAL_COMMUNITY): Payer: BC Managed Care – PPO | Admitting: Certified Registered Nurse Anesthetist

## 2020-04-27 ENCOUNTER — Ambulatory Visit (HOSPITAL_COMMUNITY)
Admission: RE | Admit: 2020-04-27 | Discharge: 2020-04-27 | Disposition: A | Discharge status: Home / Self Care | Payer: BC Managed Care – PPO | Attending: Gastroenterology | Admitting: Gastroenterology

## 2020-04-27 ENCOUNTER — Encounter (HOSPITAL_COMMUNITY): Payer: Self-pay | Admitting: Gastroenterology

## 2020-04-27 ENCOUNTER — Other Ambulatory Visit: Payer: Self-pay

## 2020-04-27 ENCOUNTER — Encounter: Payer: BC Managed Care – PPO | Admitting: Pharmacist

## 2020-04-27 ENCOUNTER — Encounter (HOSPITAL_COMMUNITY)
Admission: RE | Disposition: A | Discharge status: Home / Self Care | Payer: Self-pay | Source: Home / Self Care | Attending: Gastroenterology

## 2020-04-27 DIAGNOSIS — K5730 Diverticulosis of large intestine without perforation or abscess without bleeding: Secondary | ICD-10-CM | POA: Insufficient documentation

## 2020-04-27 DIAGNOSIS — Z79899 Other long term (current) drug therapy: Secondary | ICD-10-CM | POA: Diagnosis not present

## 2020-04-27 DIAGNOSIS — Z87891 Personal history of nicotine dependence: Secondary | ICD-10-CM | POA: Diagnosis not present

## 2020-04-27 DIAGNOSIS — Z7984 Long term (current) use of oral hypoglycemic drugs: Secondary | ICD-10-CM | POA: Diagnosis not present

## 2020-04-27 DIAGNOSIS — Z882 Allergy status to sulfonamides status: Secondary | ICD-10-CM | POA: Insufficient documentation

## 2020-04-27 DIAGNOSIS — K589 Irritable bowel syndrome without diarrhea: Secondary | ICD-10-CM | POA: Diagnosis not present

## 2020-04-27 DIAGNOSIS — Z885 Allergy status to narcotic agent status: Secondary | ICD-10-CM | POA: Insufficient documentation

## 2020-04-27 DIAGNOSIS — Z880 Allergy status to penicillin: Secondary | ICD-10-CM | POA: Insufficient documentation

## 2020-04-27 DIAGNOSIS — Z888 Allergy status to other drugs, medicaments and biological substances status: Secondary | ICD-10-CM | POA: Insufficient documentation

## 2020-04-27 DIAGNOSIS — Z86010 Personal history of colonic polyps: Secondary | ICD-10-CM | POA: Diagnosis not present

## 2020-04-27 DIAGNOSIS — K6289 Other specified diseases of anus and rectum: Secondary | ICD-10-CM | POA: Insufficient documentation

## 2020-04-27 DIAGNOSIS — R109 Unspecified abdominal pain: Secondary | ICD-10-CM | POA: Diagnosis not present

## 2020-04-27 DIAGNOSIS — Z1211 Encounter for screening for malignant neoplasm of colon: Secondary | ICD-10-CM | POA: Diagnosis not present

## 2020-04-27 DIAGNOSIS — Z20822 Contact with and (suspected) exposure to covid-19: Secondary | ICD-10-CM | POA: Diagnosis not present

## 2020-04-27 DIAGNOSIS — R159 Full incontinence of feces: Secondary | ICD-10-CM | POA: Diagnosis not present

## 2020-04-27 HISTORY — PX: COLONOSCOPY WITH PROPOFOL: SHX5780

## 2020-04-27 HISTORY — PX: BIOPSY: SHX5522

## 2020-04-27 HISTORY — DX: Family history of other specified conditions: Z84.89

## 2020-04-27 LAB — GLUCOSE, CAPILLARY
Glucose-Capillary: 97 mg/dL (ref 70–99)
Glucose-Capillary: 78 mg/dL (ref 70–99)
Glucose-Capillary: 69 mg/dL — ABNORMAL LOW (ref 70–99)
Glucose-Capillary: 77 mg/dL (ref 70–99)

## 2020-04-27 LAB — RESP PANEL BY RT-PCR (FLU A&B, COVID) ARPGX2
Influenza A by PCR: NEGATIVE
Influenza B by PCR: NEGATIVE
SARS Coronavirus 2 by RT PCR: NEGATIVE

## 2020-04-27 SURGERY — COLONOSCOPY WITH PROPOFOL
Anesthesia: General

## 2020-04-27 MED ORDER — LACTATED RINGERS IV SOLN: INTRAVENOUS | Status: DC

## 2020-04-27 MED ORDER — LIDOCAINE 2% (20 MG/ML) 5 ML SYRINGE
INTRAMUSCULAR | Status: DC | PRN
  Administered 2020-04-27: 80 mg via INTRAVENOUS

## 2020-04-27 MED ORDER — PROPOFOL 10 MG/ML IV BOLUS
INTRAVENOUS | Status: AC
  Filled 2020-04-27: qty 40

## 2020-04-27 MED ORDER — PROPOFOL 500 MG/50ML IV EMUL
INTRAVENOUS | Status: DC | PRN
  Administered 2020-04-27: 125 ug/kg/min via INTRAVENOUS

## 2020-04-27 MED ORDER — PROPOFOL 10 MG/ML IV BOLUS
INTRAVENOUS | Status: DC | PRN
  Administered 2020-04-27: 20 mg via INTRAVENOUS

## 2020-04-27 MED ORDER — DEXTROSE 50 % IV SOLN
INTRAVENOUS | Status: AC
  Filled 2020-04-27: qty 50

## 2020-04-27 MED ORDER — SODIUM CHLORIDE 0.9 % IV SOLN: INTRAVENOUS | Status: DC

## 2020-04-27 SURGICAL SUPPLY — 22 items

## 2020-04-27 NOTE — Transfer of Care (Signed)
Immediate Anesthesia Transfer of Care Note  Patient: Cynthia Deleon  Procedure(s) Performed: COLONOSCOPY WITH PROPOFOL (N/A ) BIOPSY  Patient Location: PACU and Endoscopy Unit  Anesthesia Type:MAC  Level of Consciousness: awake, alert , oriented and patient cooperative  Airway & Oxygen Therapy: Patient Spontanous Breathing and Patient connected to face mask oxygen  Post-op Assessment: Report given to RN and Post -op Vital signs reviewed and stable  Post vital signs: Reviewed and stable  Last Vitals:  Vitals Value Taken Time  BP    Temp    Pulse    Resp    SpO2      Last Pain:  Vitals:   04/27/20 1139  TempSrc: Oral  PainSc: 7          Complications: No complications documented.

## 2020-04-27 NOTE — Op Note (Signed)
[facility] Patient Name: Cynthia Deleon Procedure Date: 04/27/2020 MRN: [id number] Attending MD: Ronald Lobo , MD Date of Birth: [birth year] CSN: [id number] Age: 64 Admit Type: Outpatient Procedure:                Colonoscopy Indications:              High risk colon cancer surveillance: Personal                            history of multiple (3 or more) adenomas, Last                            colonoscopy: October 2016 Providers:                Ronald Lobo, MD, Cleda Daub, RN, Ladona Ridgel, Technician, Christell Faith, CRNA Referring MD:              Medicines:                Monitored Anesthesia Care Complications:            No immediate complications. Estimated Blood Loss:     Estimated blood loss was minimal. Procedure:                Pre-Anesthesia Assessment:                           - Prior to the procedure, a History and Physical                            was performed, and patient medications and                            allergies were reviewed. The patient's tolerance of                            previous anesthesia was also reviewed. The risks                            and benefits of the procedure and the sedation                            options and risks were discussed with the patient.                            All questions were answered, and informed consent                            was obtained. Prior Anticoagulants: The patient has                            taken no previous anticoagulant or antiplatelet  agents. ASA Grade Assessment: II - A patient with                            mild systemic disease. After reviewing the risks                            and benefits, the patient was deemed in                            satisfactory condition to undergo the procedure.                           After obtaining informed consent, the colonoscope                            was  passed under direct vision. Throughout the                            procedure, the patient's blood pressure, pulse, and                            oxygen saturations were monitored continuously. The                            CF-HQ190L ([id number]) Olympus colonoscope was                            introduced through the anus and advanced to the                            mid-sigmoid colon, with further advancement                            inhibited by sigmoid fixation. Then the PCF-[id number]                            ([id number]) Olympus pediatric colonscope was                            introduced through the anus and advanced to the                            cecum, identified by the appendiceal orifice and                            the ileo-cecal valve. The colonoscopy was                            technically difficult and complex due to restricted                            mobility of the colon. Successful completion of the  procedure was aided by withdrawing the scope and                            replacing with the pediatric colonoscope. The                            patient tolerated the procedure well. The quality                            of the bowel preparation was good.                           THIS PROCEDURE WAS PERFORMED AS AN OUTPATIENT AT                            [place] OF A HISTORY OF                            POST-PROCEDURAL PAIN following her last exam, which                            was thought to possibly be due to gas retention.                            The use of CO2 for insufflation, plus careful                            attempts to suction out gas during withdrawl of the                            scope, will hopefully prevent a recurrence of that                            problem. Scope In: 32:67:12 PM Scope Out: 1:08:44 PM Scope Withdrawal Time: 0 hours 9 minutes 57 seconds  Total Procedure Duration: 0  hours 22 minutes 41 seconds  Findings:      The digital rectal exam findings include decreased sphincter tone.      Small-mouthed diverticula were found in the sigmoid colon. There was       associated myochosis and fixation of the colon.      The exam was otherwise normal throughout the examined colon.      There is no endoscopic evidence of inflammation, mass or polyps in the       entire colon.      Random biopsies were taken with a cold forceps from the entire colon,       and from the rectum, for histology, in view of the patient's IBS and       abdominal pain. Estimated blood loss was minimal.      Retroflexion was not performed in the rectum due to a small rectal       ampulla (there was a lot of spasm in the rectum). Antegrade viewing       disclosed no abnormalities. Repeat inspection disclosed no additional       findings.  As mentioned above, stringent efforts were made to remove as much gas as       possible during the withdrawl of the scope. Impression:               - Decreased sphincter tone found on digital rectal                            exam.                           - Diverticulosis in the sigmoid colon.                           - Biopsies were taken with a cold forceps for                            histology in the entire colon. Moderate Sedation:      This patient was sedated with monitored anesthesia care, not moderate       sedation. Recommendation:           - Await pathology results.                           - Continue present medications. Procedure Code(s):        --- Professional ---                           [phone], Colonoscopy, flexible; with biopsy, single                            or multiple Diagnosis Code(s):        --- Professional ---                           Z86.010, Personal history of colonic polyps                           K62.89, Other specified diseases of anus and rectum                           K57.30, Diverticulosis of large  intestine without                            perforation or abscess without bleeding CPT copyright 2019 American Medical Association. All rights reserved. The codes documented in this report are preliminary and upon coder review may  be revised to meet current compliance requirements. Ronald Lobo, MD 04/27/2020 1:31:30 PM This report has been signed electronically. Number of Addenda: 0

## 2020-04-27 NOTE — Anesthesia Preprocedure Evaluation (Signed)
Anesthesia Evaluation  Patient identified by MRN, date of birth, ID band Patient awake    Reviewed: Allergy & Precautions, NPO status , Patient's Chart, lab work & pertinent test results  Airway Mallampati: II       Dental  (+) Poor Dentition   Pulmonary sleep apnea, Continuous Positive Airway Pressure Ventilation and Oxygen sleep apnea , former smoker,    Pulmonary exam normal        Cardiovascular hypertension, Pt. on medications Normal cardiovascular exam     Neuro/Psych PSYCHIATRIC DISORDERS Anxiety Depression Bipolar Disorder    GI/Hepatic   Endo/Other  diabetes, Oral Hypoglycemic Agents  Renal/GU      Musculoskeletal   Abdominal Normal abdominal exam  (+)   Peds  Hematology   Anesthesia Other Findings   Reproductive/Obstetrics                             Anesthesia Physical  Anesthesia Plan  ASA: II  Anesthesia Plan: General   Post-op Pain Management:    Induction: Intravenous  PONV Risk Score and Plan:   Airway Management Planned: Natural Airway, Nasal Cannula and Simple Face Mask  Additional Equipment: None  Intra-op Plan:   Post-operative Plan:   Informed Consent: I have reviewed the patients History and Physical, chart, labs and discussed the procedure including the risks, benefits and alternatives for the proposed anesthesia with the patient or authorized representative who has indicated his/her understanding and acceptance.       Plan Discussed with: CRNA  Anesthesia Plan Comments:         Anesthesia Quick Evaluation

## 2020-04-27 NOTE — Discharge Instructions (Signed)

## 2020-04-27 NOTE — Progress Notes (Signed)
Gave pt apple juice for low blood sugar

## 2020-04-27 NOTE — H&P (Signed)
Cynthia Deleon is an 64 y.o. female.   Chief Complaint: History of colon polyps HPI: 64 year old female has a history of several diminutive adenomatous polyps removed with her most recent colonoscopy having been 5 years ago, that procedure complicated by diffuse postprocedural abdominal pain thought to possibly be related to gas retention.  She has chronic abdominal pain, IBS, and fecal incontinence.  Past Medical History:  Diagnosis Date  . Arthritis    hands and knees  . Bipolar 1 disorder ([place])   . Borderline diabetic   . Chronic back pain   . Chronic neck pain   . Complication of anesthesia    high anxiety-does not want to be alone  . COPD (chronic obstructive pulmonary disease) ([place])   . Current use of estrogen therapy 01/12/2014  . Depression   . Diabetes mellitus without complication ([place])   . Diplopia   . Family history of adverse reaction to anesthesia    sister "gas in lungs"  . GERD (gastroesophageal reflux disease)   . Headache   . Hot flashes 12/15/2013  . Hypertension   . IBS (irritable bowel syndrome)   . Incontinence   . Kidney stone   . 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 Panic attacks   . Peptic ulcer   . Peripheral neuropathy   . Rosacea   . Shingles   . Sleep apnea    uses a cpap-with oxygen    Past Surgical History:  Procedure Laterality Date  . ABDOMINAL HYSTERECTOMY    . BUNIONECTOMY WITH HAMMERTOE RECONSTRUCTION Right 12/10/2012   Procedure: RIGHT FIRST METATARSAL CHEVRON BUNION CORRECTION,  2 AND 3 HAMMERTOE CORRECTION , RIGHT 3 AND 4 TOE NAIL EXCISION ;  Surgeon: Wylene Simmer, MD;  Location: [place];  Service: Orthopedics;  Laterality: Right;  . FOOT ARTHRODESIS  2000   both feet  . LIPOSUCTION  03/2018  . RECTAL SURGERY    . TONSILLECTOMY      Family History  Problem Relation Age of Onset  . Diabetes Mother   . Other Mother        vertigo; chronic eye disease  . COPD Father   . COPD Sister   . Alcohol abuse Brother    . Diabetes Maternal Grandmother   . Diabetes Maternal Grandfather   . Diabetes Sister   . Other Sister        hearing problems  . Hyperlipidemia Sister   . Alcohol abuse Brother   . COPD Brother   . Alcohol abuse Brother   . Alcohol abuse Brother   . Other Brother        aneursym   Social History:  reports that she quit smoking about a year ago. Her smoking use included cigarettes. She has a 17.50 pack-year smoking history. She has never used smokeless tobacco. She reports that she does not drink alcohol and does not use drugs.  Allergies:  Allergies  Allergen Reactions  . Chantix [Varenicline] Other (See Comments)    Altered mental status-Per patient was put on allergy list by Dr Lorriane Shire bu patient staes she is not allergic to this and is currently taking it.   . Divalproex Sodium Other (See Comments)    Hallucinations  Other reaction(s): Confusion  . Pseudoeph-Hydrocodone-Gg Nausea Only  . Sulfa Antibiotics Anaphylaxis and Nausea Only  . Varenicline Tartrate     Other reaction(s): Confusion  . Ambien [Zolpidem] Other (See Comments)    Causes sleep walking  . Codeine Nausea  And Vomiting  . Hydrocodone Nausea And Vomiting  . Paxil [Paroxetine Hcl] Other (See Comments)    Causes ringing in the ears.   . Sulfur Other (See Comments)    Swelling of tongue  . Amoxicillin Rash    Has patient had a PCN reaction causing immediate rash, facial/tongue/throat swelling, SOB or lightheadedness with hypotension: No Has patient had a PCN reaction causing severe rash involving mucus membranes or skin necrosis: No Has patient had a PCN reaction that required hospitalization: No Has patient had a PCN reaction occurring within the last 10 years: Yes If all of the above answers are "NO", then may proceed with Cephalosporin use.   . Tape Rash    Medications Prior to Admission  Medication Sig Dispense Refill  . ARIPiprazole (ABILIFY) 10 MG tablet Take 10 mg by mouth daily.    [name]  atorvastatin (LIPITOR) 40 MG tablet TAKE ONE TABLET BY MOUTH ONCE DAILY. 90 tablet 1  . cephALEXin (KEFLEX) 500 MG capsule Take 1 capsule (500 mg total) by mouth 2 (two) times daily for 7 days. 14 capsule 0  . dexlansoprazole (DEXILANT) 60 MG capsule Take 60 mg by mouth daily.    . diazepam (VALIUM) 5 MG tablet Take 5 mg by mouth in the morning and at bedtime.     . fluticasone (FLONASE) 50 MCG/ACT nasal spray Place 2 sprays into both nostrils daily. 16 g 5  . glipiZIDE (GLUCOTROL XL) 5 MG 24 hr tablet Take 1 tablet (5 mg total) by mouth every evening. Take with evening meal 30 tablet 2  . glucose blood (CONTOUR NEXT TEST) test strip Test BS 4 times daily Dx E11.9 400 strip 3  . lamoTRIgine (LAMICTAL) 200 MG tablet Take 200 mg by mouth at bedtime. For mood disorder.    . levocetirizine (XYZAL) 5 MG tablet TAKE ONE TABLET BY MOUTH IN THE EVENING. 30 tablet 5  . lisinopril (ZESTRIL) 5 MG tablet Take 1 tablet (5 mg total) by mouth daily. 30 tablet 2  . OneTouch Delica Lancets 29U MISC Use to test blood sugar twice daily. DX: E11.9 100 each 3  . oxyCODONE (ROXICODONE) 15 MG immediate release tablet Take 7.5 mg by mouth every 6 (six) hours as needed.     . pregabalin (LYRICA) 150 MG capsule Take 1 capsule (150 mg total) by mouth 2 (two) times daily. 60 capsule 2  . PREMARIN 0.3 MG tablet TAKE (1) TABLET BY MOUTH ONCE DAILY. 30 tablet 7  . sitaGLIPtin (JANUVIA) 100 MG tablet Take 1 tablet (100 mg total) by mouth daily. 30 tablet 3  . umeclidinium-vilanterol (ANORO ELLIPTA) 62.5-25 MCG/INH AEPB Inhale 1 puff into the lungs daily. 30 each 6  . valACYclovir (VALTREX) 500 MG tablet Take 500 mg by mouth daily as needed (shingles flare up).   4  . cyclobenzaprine (FLEXERIL) 10 MG tablet Take 10 mg by mouth 3 (three) times daily as needed. (Patient not taking: Reported on 04/21/2020)    . metFORMIN (GLUCOPHAGE-XR) 500 MG 24 hr tablet Take 1 tablet (500 mg total) by mouth every evening. Take with dinner 30  tablet 2  . nystatin (MYCOSTATIN) 100000 UNIT/ML suspension Take 5 mLs (500,000 Units total) by mouth 4 (four) times daily. 60 mL 0  . temazepam (RESTORIL) 15 MG capsule Take 15 mg by mouth at bedtime.      Results for orders placed or performed during the hospital encounter of 04/27/20 (from the past 48 hour(s))  Resp Panel by RT-PCR (Flu A&B,  Covid) Nasopharyngeal Swab     Status: None   Collection Time: 04/27/20  9:52 AM   Specimen: Nasopharyngeal Swab; Nasopharyngeal(NP) swabs in vial transport medium  Result Value Ref Range   SARS Coronavirus 2 by RT PCR NEGATIVE NEGATIVE    Comment: (NOTE) SARS-CoV-2 target nucleic acids are NOT DETECTED.  The SARS-CoV-2 RNA is generally detectable in upper respiratory specimens during the acute phase of infection. The lowest concentration of SARS-CoV-2 viral copies this assay can detect is 138 copies/mL. A negative result does not preclude SARS-Cov-2 infection and should not be used as the sole basis for treatment or other patient management decisions. A negative result may occur with  improper specimen collection/handling, submission of specimen other than nasopharyngeal swab, presence of viral mutation(s) within the areas targeted by this assay, and inadequate number of viral copies(<138 copies/mL). A negative result must be combined with clinical observations, patient history, and epidemiological information. The expected result is Negative.  Fact Sheet for Patients:  EntrepreneurPulse.com.au  Fact Sheet for Healthcare Providers:  IncredibleEmployment.be  This test is no t yet approved or cleared by the [place] FDA and  has been authorized for detection and/or diagnosis of SARS-CoV-2 by FDA under an Emergency Use Authorization (EUA). This EUA will remain  in effect (meaning this test can be used) for the duration of the COVID-19 declaration under Section 564(b)(1) of the Act, 21 U.S.C.section  360bbb-3(b)(1), unless the authorization is terminated  or revoked sooner.       Influenza A by PCR NEGATIVE NEGATIVE   Influenza B by PCR NEGATIVE NEGATIVE    Comment: (NOTE) The Xpert Xpress SARS-CoV-2/FLU/RSV plus assay is intended as an aid in the diagnosis of influenza from Nasopharyngeal swab specimens and should not be used as a sole basis for treatment. Nasal washings and aspirates are unacceptable for Xpert Xpress SARS-CoV-2/FLU/RSV testing.  Fact Sheet for Patients: EntrepreneurPulse.com.au  Fact Sheet for Healthcare Providers: IncredibleEmployment.be  This test is not yet approved or cleared by the [place] FDA and has been authorized for detection and/or diagnosis of SARS-CoV-2 by FDA under an Emergency Use Authorization (EUA). This EUA will remain in effect (meaning this test can be used) for the duration of the COVID-19 declaration under Section 564(b)(1) of the Act, 21 U.S.C. section 360bbb-3(b)(1), unless the authorization is terminated or revoked.  Performed at [facility], [place] [address] [place], [place] [address]   Glucose, capillary     Status: None   Collection Time: 04/27/20 11:28 AM  Result Value Ref Range   Glucose-Capillary 97 70 - 99 mg/dL    Comment: Glucose reference range applies only to samples taken after fasting for at least 8 hours.  Glucose, capillary     Status: None   Collection Time: 04/27/20 12:18 PM  Result Value Ref Range   Glucose-Capillary 78 70 - 99 mg/dL    Comment: Glucose reference range applies only to samples taken after fasting for at least 8 hours.   No results found.  Review of Systems  Blood pressure 133/64, pulse 83, temperature 97.8 F (36.6 C), temperature source Oral, resp. rate 19, height 5\' 9"  (1.753 m), weight 77.1 kg, SpO2 96 %. Physical Exam no distress despite 7 out of 10 stated abdominal pain.  No abdominal tenderness.  Chest clear, heart normal.   Cognitively intact.  Mood somewhat anxious.  Assessment/Plan Medically stable for colonoscopic evaluation.  We are doing it at the hospital to take advantage of CO2 insufflation which might  diminish postprocedural gas retention and associated discomfort.  Cleotis Nipper, MD 04/27/2020, 12:39 PM

## 2020-04-27 NOTE — Anesthesia Procedure Notes (Signed)
Procedure Name: MAC Date/Time: 04/27/2020 12:33 PM Performed by: West Pugh, CRNA Pre-anesthesia Checklist: Patient identified, Emergency Drugs available, Suction available, Patient being monitored and Timeout performed Patient Re-evaluated:Patient Re-evaluated prior to induction Oxygen Delivery Method: Simple face mask Preoxygenation: Pre-oxygenation with 100% oxygen Placement Confirmation: positive ETCO2 Dental Injury: Teeth and Oropharynx as per pre-operative assessment

## 2020-04-28 ENCOUNTER — Ambulatory Visit: Payer: BC Managed Care – PPO | Admitting: Pharmacist

## 2020-04-28 LAB — SURGICAL PATHOLOGY

## 2020-05-01 ENCOUNTER — Telehealth: Payer: Self-pay | Admitting: Family Medicine

## 2020-05-01 ENCOUNTER — Other Ambulatory Visit: Payer: Self-pay | Admitting: Family Medicine

## 2020-05-01 ENCOUNTER — Encounter (HOSPITAL_COMMUNITY): Payer: Self-pay | Admitting: Gastroenterology

## 2020-05-01 ENCOUNTER — Encounter (HOSPITAL_COMMUNITY): Payer: Self-pay

## 2020-05-01 MED ORDER — CIPROFLOXACIN HCL 500 MG PO TABS: 500.0000 mg | ORAL_TABLET | Freq: Two times a day (BID) | ORAL | 0 refills | Status: DC

## 2020-05-01 NOTE — Telephone Encounter (Signed)
Pt was told to let us know if her uti did not get any better after taking medicine, she finished the antibiotic and it has been less than a week and she feels like the uti did not completely go away because she is urinating frequently and it is burning when she does

## 2020-05-01 NOTE — Telephone Encounter (Signed)
I sent in a higher strength antibiotic.See how that does

## 2020-05-01 NOTE — Progress Notes (Signed)
Patient scheduled for follow-up LDCT on 12/21 at 1100. Patient aware.

## 2020-05-01 NOTE — Telephone Encounter (Signed)
Left message that another medication has been sent in.

## 2020-05-01 NOTE — Telephone Encounter (Signed)
lmtcb

## 2020-05-01 NOTE — Telephone Encounter (Signed)
Patient seen Tiffany on 11/12 and was given keflex & Diflucan- please review and advise

## 2020-05-03 ENCOUNTER — Other Ambulatory Visit: Payer: Self-pay | Admitting: Family Medicine

## 2020-05-03 NOTE — Anesthesia Postprocedure Evaluation (Addendum)
Anesthesia Post Note  Patient: Cynthia Deleon  Procedure(s) Performed: COLONOSCOPY WITH PROPOFOL (N/A ) BIOPSY     Patient location during evaluation: Endoscopy Anesthesia Type: MAC Level of consciousness: awake Pain management: pain level controlled Vital Signs Assessment: post-procedure vital signs reviewed and stable Respiratory status: spontaneous breathing Cardiovascular status: stable Postop Assessment: no apparent nausea or vomiting Anesthetic complications: no   No complications documented.  Last Vitals:  Vitals:   04/27/20 1330 04/27/20 1340  BP: 120/75   Pulse: 76 82  Resp: 16 11  Temp:    SpO2: 97% 98%    Last Pain:  Vitals:   04/27/20 1340  TempSrc:   PainSc: [name]

## 2020-05-08 NOTE — Progress Notes (Signed)
This encounter was created in error - please disregard.

## 2020-05-11 DIAGNOSIS — F310 Bipolar disorder, current episode hypomanic: Secondary | ICD-10-CM | POA: Diagnosis not present

## 2020-05-22 NOTE — Telephone Encounter (Signed)
error 

## 2020-05-30 ENCOUNTER — Ambulatory Visit (HOSPITAL_COMMUNITY): Admission: RE | Admit: 2020-05-30 | Payer: BC Managed Care – PPO | Source: Ambulatory Visit

## 2020-06-01 ENCOUNTER — Other Ambulatory Visit: Payer: Self-pay | Admitting: Family Medicine

## 2020-06-01 DIAGNOSIS — E119 Type 2 diabetes mellitus without complications: Secondary | ICD-10-CM

## 2020-06-01 DIAGNOSIS — G6289 Other specified polyneuropathies: Secondary | ICD-10-CM

## 2020-06-01 DIAGNOSIS — M47896 Other spondylosis, lumbar region: Secondary | ICD-10-CM | POA: Diagnosis not present

## 2020-06-01 DIAGNOSIS — I10 Essential (primary) hypertension: Secondary | ICD-10-CM

## 2020-06-05 ENCOUNTER — Ambulatory Visit: Payer: BC Managed Care – PPO | Admitting: Family Medicine

## 2020-06-07 ENCOUNTER — Telehealth: Payer: Self-pay | Admitting: Pharmacist

## 2020-06-07 ENCOUNTER — Ambulatory Visit (INDEPENDENT_AMBULATORY_CARE_PROVIDER_SITE_OTHER): Payer: BC Managed Care – PPO

## 2020-06-07 ENCOUNTER — Other Ambulatory Visit: Payer: Self-pay

## 2020-06-07 DIAGNOSIS — Z23 Encounter for immunization: Secondary | ICD-10-CM

## 2020-06-07 MED ORDER — METFORMIN HCL ER 500 MG PO TB24: 500.0000 mg | ORAL_TABLET | Freq: Two times a day (BID) | ORAL | 2 refills | Status: DC

## 2020-06-07 MED ORDER — ONDANSETRON 4 MG PO TBDP: 4.0000 mg | ORAL_TABLET | Freq: Three times a day (TID) | ORAL | 0 refills | Status: DC | PRN

## 2020-06-07 MED ORDER — PANTOPRAZOLE SODIUM 40 MG PO TBEC: 40.0000 mg | DELAYED_RELEASE_TABLET | Freq: Every day | ORAL | 3 refills | Status: DC

## 2020-06-07 MED ORDER — GLIPIZIDE ER 5 MG PO TB24
5.0000 mg | ORAL_TABLET | Freq: Two times a day (BID) | ORAL | 2 refills | Status: DC
Start: 2020-06-07 — End: 2020-10-04

## 2020-06-07 NOTE — Telephone Encounter (Signed)
Patient's BG are running 300-400 Increased metformin and glipizide to twice daily Changed PPI to generic

## 2020-06-07 NOTE — Progress Notes (Signed)
° °  Covid-19 Vaccination Clinic  Name:  Cynthia Deleon    MRN: [id number] DOB: 1956/05/31  06/07/2020  [name] was observed post Covid-19 immunization for 15 minutes without incident. She was provided with Vaccine Information Sheet and instruction to access the V-Safe system.   [name] was instructed to call 911 with any severe reactions post vaccine:  Difficulty breathing   Swelling of face and throat   A fast heartbeat   A bad rash all over body   Dizziness and weakness   Immunizations Administered    Name Date Dose VIS Date Route   Moderna Covid-19 Booster Vaccine 06/07/2020 11:01 AM 0.25 mL 03/29/2020 Intramuscular   Manufacturer: Gala Murdoch   Lot: [id number]   NDC: 85027-741-28

## 2020-06-08 ENCOUNTER — Ambulatory Visit: Payer: BC Managed Care – PPO | Admitting: Family Medicine

## 2020-06-08 ENCOUNTER — Ambulatory Visit (INDEPENDENT_AMBULATORY_CARE_PROVIDER_SITE_OTHER): Payer: BC Managed Care – PPO | Admitting: Family Medicine

## 2020-06-08 ENCOUNTER — Encounter: Payer: Self-pay | Admitting: Family Medicine

## 2020-06-08 VITALS — BP 129/63 | HR 85 | Temp 97.6°F | Ht 69.0 in | Wt 174.2 lb

## 2020-06-08 DIAGNOSIS — N183 Chronic kidney disease, stage 3 unspecified: Secondary | ICD-10-CM

## 2020-06-08 DIAGNOSIS — F4310 Post-traumatic stress disorder, unspecified: Secondary | ICD-10-CM

## 2020-06-08 DIAGNOSIS — I10 Essential (primary) hypertension: Secondary | ICD-10-CM | POA: Diagnosis not present

## 2020-06-08 DIAGNOSIS — F3164 Bipolar disorder, current episode mixed, severe, with psychotic features: Secondary | ICD-10-CM

## 2020-06-08 DIAGNOSIS — J449 Chronic obstructive pulmonary disease, unspecified: Secondary | ICD-10-CM | POA: Diagnosis not present

## 2020-06-08 DIAGNOSIS — G6289 Other specified polyneuropathies: Secondary | ICD-10-CM

## 2020-06-08 DIAGNOSIS — K219 Gastro-esophageal reflux disease without esophagitis: Secondary | ICD-10-CM

## 2020-06-08 DIAGNOSIS — E1122 Type 2 diabetes mellitus with diabetic chronic kidney disease: Secondary | ICD-10-CM

## 2020-06-08 DIAGNOSIS — Z8619 Personal history of other infectious and parasitic diseases: Secondary | ICD-10-CM

## 2020-06-08 LAB — BAYER DCA HB A1C WAIVED: HB A1C (BAYER DCA - WAIVED): 7.4 % — ABNORMAL HIGH (ref <7.0)

## 2020-06-08 MED ORDER — VALACYCLOVIR HCL 500 MG PO TABS
500.0000 mg | ORAL_TABLET | Freq: Every day | ORAL | 4 refills | Status: DC | PRN
Start: 2020-06-08 — End: 2021-06-19

## 2020-06-08 NOTE — Progress Notes (Signed)
Subjective:  [name], female    DOB: 03-Oct-1955  Age: 64 y.o. MRN: [id number]  CC: Medical Management of Chronic Issues   HPI [name] presents for Follow-up of diabetes.   1. DM Compliant with meds - metfomin and glipizide, she spoke with Almyra Free yesterday and had her metformin and glipizide increased to BID Checking CBGs? Yes, has been high in the 300s Exercising regularly? - Yes Watching carbohydrate intake? - Yes Neuropathy? Yes, improved Hypoglycemic events - No Vision problems - no, needs to schedule eye exam with my eye doctor  2. HTN Complaint with meds - Yes Watching Salt intake - Yes Pertinent ROS:  Headache - No Fatigue - No Visual Disturbances - No Chest pain - No Dyspnea - No Palpitations - No LE edema - No They report good compliance with medications and can restate their regimen by memory. No medication side effects.  Family, social, and smoking history reviewed.   BP Readings from Last 3 Encounters:  06/08/20 129/63  04/27/20 120/75  04/21/20 106/72   Medications as noted below. Taking them regularly without complication/adverse reaction being reported today.   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   She has a past surgical history that includes Abdominal hysterectomy; Tonsillectomy; Foot arthrodesis (2000); Bunionectomy  with hammertoe reconstruction (Right, 12/10/2012); Rectal surgery; Liposuction (03/2018); Colonoscopy with propofol (N/A, 04/27/2020); and biopsy (04/27/2020).   Her family history includes Alcohol abuse in her brother, brother, brother, and brother; COPD in her brother, father, and sister; Diabetes in her maternal grandfather, maternal grandmother, mother, and sister; Hyperlipidemia in her sister; Other in her brother, mother, and sister.She reports that she quit smoking about 12 months ago. Her smoking use included cigarettes. She has a 17.50 pack-year smoking history. She has never used smokeless tobacco. She reports that she does not drink alcohol and does not use drugs.  Current Outpatient Medications on File Prior to Visit  Medication Sig Dispense Refill  . ARIPiprazole (ABILIFY) 10 MG tablet Take 10 mg by mouth daily.    [name] atorvastatin (LIPITOR) 40 MG tablet TAKE ONE TABLET BY MOUTH ONCE DAILY. 90 tablet 1  . BELSOMRA 20 MG TABS Take 1 tablet by mouth at bedtime as needed.    . cyclobenzaprine (FLEXERIL) 10 MG tablet     . diazepam (VALIUM) 5 MG tablet Take 5 mg by mouth in the morning and at bedtime.     . fluticasone (FLONASE) 50 MCG/ACT nasal spray Place 2 sprays into both nostrils daily. 16 g 5  . gabapentin (NEURONTIN) 100 MG capsule SMARTSIG:1 By Mouth 4-5 Times Daily    . glipiZIDE (GLUCOTROL XL) 5 MG 24 hr tablet Take 1 tablet (5 mg total) by mouth 2 (two) times daily with a meal. 30 tablet 2  . glucose blood (CONTOUR NEXT TEST) test strip Test BS 4 times daily Dx E11.9 400 strip 3  . lamoTRIgine (LAMICTAL) 200 MG tablet Take 200 mg by mouth at bedtime. For mood disorder.    . levocetirizine (XYZAL) 5 MG tablet TAKE ONE  TABLET BY MOUTH IN THE EVENING. 30 tablet 5  . lisinopril (ZESTRIL) 5 MG tablet TAKE (1) TABLET BY MOUTH ONCE DAILY. 30 tablet 0  . metFORMIN (GLUCOPHAGE-XR) 500 MG 24 hr tablet Take 1 tablet (500 mg total) by mouth 2 (two) times daily with a meal. 30 tablet 2  .  nystatin (MYCOSTATIN) 100000 UNIT/ML suspension Take 5 mLs (500,000 Units total) by mouth 4 (four) times daily. 60 mL 0  . ondansetron (ZOFRAN ODT) 4 MG disintegrating tablet Take 1 tablet (4 mg total) by mouth every 8 (eight) hours as needed for nausea or vomiting. 20 tablet 0  . OneTouch Delica Lancets 54U MISC Use to test blood sugar twice daily. DX: E11.9 100 each 3  . oxyCODONE (ROXICODONE) 15 MG immediate release tablet Take 7.5 mg by mouth every 6 (six) hours as needed.     . pantoprazole (PROTONIX) 40 MG tablet Take 1 tablet (40 mg total) by mouth daily. 30 tablet 3  . pregabalin (LYRICA) 150 MG capsule TAKE (1) CAPSULE BY MOUTH TWICE DAILY. 60 capsule 0  . PREMARIN 0.3 MG tablet TAKE (1) TABLET BY MOUTH ONCE DAILY. 30 tablet 7  . sitaGLIPtin (JANUVIA) 100 MG tablet Take 1 tablet (100 mg total) by mouth daily. 30 tablet 3  . temazepam (RESTORIL) 15 MG capsule Take 15 mg by mouth at bedtime.    [name] umeclidinium-vilanterol (ANORO ELLIPTA) 62.5-25 MCG/INH AEPB Inhale 1 puff into the lungs daily. 30 each 6  . valACYclovir (VALTREX) 500 MG tablet Take 500 mg by mouth daily as needed (shingles flare up).   4   No current facility-administered medications on file prior to visit.    ROS Review of Systems Negative unless specially indicated above in HPI.  Objective:  BP 129/63   Pulse 85   Temp 97.6 F (36.4 C) (Temporal)   Ht 5' 9" (1.753 m)   Wt 174 lb 4 oz (79 kg)   BMI 25.73 kg/m   BP Readings from Last 3 Encounters:  06/08/20 129/63  04/27/20 120/75  04/21/20 106/72    Wt Readings from Last 3 Encounters:  06/08/20 174 lb 4 oz (79 kg)  04/27/20 170 lb (77.1 kg)  04/21/20 170 lb (77.1 kg)    Physical Exam Vitals and nursing note reviewed.  Constitutional:      General: She is not in acute distress.    Appearance: Normal appearance. She is not ill-appearing, toxic-appearing or diaphoretic.  HENT:     Head: Normocephalic and atraumatic.  Cardiovascular:     Rate and  Rhythm: Normal rate and regular rhythm.     Heart sounds: Normal heart sounds. No murmur heard.   Pulmonary:     Effort: Pulmonary effort is normal. No respiratory distress.     Breath sounds: Normal breath sounds.  Abdominal:     General: Bowel sounds are normal.     Palpations: Abdomen is soft.  Musculoskeletal:     Right lower leg: No edema.     Left lower leg: No edema.  Skin:    General: Skin is warm and dry.  Neurological:     General: No focal deficit present.     Mental Status: She is alert and oriented to person, place, and time.  Psychiatric:        Mood and Affect: Mood normal.        Behavior: Behavior normal.        Thought Content: Thought content normal.  Judgment: Judgment normal.     Lab Results  Component Value Date   HGBA1C 6.8 03/07/2020   HGBA1C 5.9 12/02/2019   HGBA1C 6.1 (H) 04/17/2016    Lab Results  Component Value Date   WBC 7.8 12/02/2019   HGB 15.2 12/02/2019   HCT 43.6 12/02/2019   PLT 214 12/02/2019   GLUCOSE 139 (H) 03/07/2020   CHOL 152 03/07/2020   TRIG 205 (H) 03/07/2020   HDL 39 (L) 03/07/2020   LDLCALC 79 03/07/2020   ALT 7 03/07/2020   AST 11 03/07/2020   NA 143 03/07/2020   K 4.2 03/07/2020   CL 104 03/07/2020   CREATININE 0.75 03/07/2020   BUN 19 03/07/2020   CO2 23 03/07/2020   TSH 1.060 03/07/2020   HGBA1C 6.8 03/07/2020     Assessment & Plan:   Cova was seen today for medical management of chronic issues.  Diagnoses and all orders for this visit:  Diabetes mellitus with stage 3 chronic kidney disease (HCC) A1C above goal at 7.4. Almyra Free increased metformin and glipizide to BID for her yesterday. Labs pending as below. She will schedule an eye exam.  -     BMP8+EGFR -     CBC with Differential/Platelet -     Lipid panel -     Bayer DCA Hb A1c Waived  Other polyneuropathy Well controlled on current regimen.   Chronic obstructive pulmonary disease, unspecified COPD type ([place]) Well controlled on  current regimen.   Essential hypertension Well controlled on current regimen.  -     BMP8+EGFR -     CBC with Differential/Platelet -     Lipid panel -     Bayer DCA Hb A1c Waived  PTSD (post-traumatic stress disorder)/ Bipolar I disorder, most recent episode mixed, severe with psychotic features (HCC) PHQ 9 score of 12 today. Managed by psych.   Gastroesophageal reflux disease, unspecified whether esophagitis present Well controlled on current regimen.   H/O cold sores -     valACYclovir (VALTREX) 500 MG tablet; Take 1 tablet (500 mg total) by mouth daily as needed.   Follow-up: 3 months for chronic follow up   The above assessment and management plan was discussed with the patient. The patient verbalized understanding of and has agreed to the management plan. Patient is aware to call the clinic if symptoms fail to improve or worsen. Patient is aware when to return to the clinic for a follow-up visit. Patient educated on when it is appropriate to go to the emergency department.   Marjorie Smolder, FNP-C [facility] [address] [place], [place] [address] [phone]

## 2020-06-08 NOTE — Patient Instructions (Signed)

## 2020-06-09 LAB — BMP8+EGFR
BUN/Creatinine Ratio: 21 (ref 12–28)
BUN: 15 mg/dL (ref 8–27)
CO2: 24 mmol/L (ref 20–29)
Calcium: 9.7 mg/dL (ref 8.7–10.3)
Chloride: 101 mmol/L (ref 96–106)
Creatinine, Ser: 0.72 mg/dL (ref 0.57–1.00)
GFR calc Af Amer: 102 mL/min/{1.73_m2} (ref >59)
GFR calc non Af Amer: 89 mL/min/{1.73_m2} (ref >59)
Glucose: 187 mg/dL — ABNORMAL HIGH (ref 65–99)
Potassium: 4.9 mmol/L (ref 3.5–5.2)
Sodium: 141 mmol/L (ref 134–144)

## 2020-06-09 LAB — LIPID PANEL
Chol/HDL Ratio: 3.4 ratio (ref 0.0–4.4)
Cholesterol, Total: 155 mg/dL (ref 100–199)
HDL: 45 mg/dL (ref >39)
LDL Chol Calc (NIH): 84 mg/dL (ref 0–99)
Triglycerides: 146 mg/dL (ref 0–149)
VLDL Cholesterol Cal: 26 mg/dL (ref 5–40)

## 2020-06-09 LAB — CBC WITH DIFFERENTIAL/PLATELET
Basophils Absolute: 0.1 10*3/uL (ref 0.0–0.2)
Basos: 1 %
EOS (ABSOLUTE): 0.2 10*3/uL (ref 0.0–0.4)
Eos: 2 %
Hematocrit: 45.7 % (ref 34.0–46.6)
Hemoglobin: 15.8 g/dL (ref 11.1–15.9)
Immature Grans (Abs): 0.1 10*3/uL (ref 0.0–0.1)
Immature Granulocytes: 1 %
Lymphocytes Absolute: 1.8 10*3/uL (ref 0.7–3.1)
Lymphs: 16 %
MCH: 31.3 pg (ref 26.6–33.0)
MCHC: 34.6 g/dL (ref 31.5–35.7)
MCV: 91 fL (ref 79–97)
Monocytes Absolute: 0.8 10*3/uL (ref 0.1–0.9)
Monocytes: 7 %
Neutrophils Absolute: 8.1 10*3/uL — ABNORMAL HIGH (ref 1.4–7.0)
Neutrophils: 73 %
Platelets: 201 10*3/uL (ref 150–450)
RBC: 5.05 x10E6/uL (ref 3.77–5.28)
RDW: 12.1 % (ref 11.7–15.4)
WBC: 11 10*3/uL — ABNORMAL HIGH (ref 3.4–10.8)

## 2020-06-12 ENCOUNTER — Ambulatory Visit: Payer: BC Managed Care – PPO | Admitting: Dermatology

## 2020-06-14 ENCOUNTER — Encounter: Payer: Self-pay | Admitting: Physician Assistant

## 2020-06-14 ENCOUNTER — Other Ambulatory Visit: Payer: Self-pay

## 2020-06-14 ENCOUNTER — Ambulatory Visit (INDEPENDENT_AMBULATORY_CARE_PROVIDER_SITE_OTHER): Payer: 59 | Admitting: Physician Assistant

## 2020-06-14 DIAGNOSIS — Z1283 Encounter for screening for malignant neoplasm of skin: Secondary | ICD-10-CM

## 2020-06-14 DIAGNOSIS — L989 Disorder of the skin and subcutaneous tissue, unspecified: Secondary | ICD-10-CM

## 2020-06-14 DIAGNOSIS — D1800 Hemangioma unspecified site: Secondary | ICD-10-CM | POA: Diagnosis not present

## 2020-06-14 DIAGNOSIS — L821 Other seborrheic keratosis: Secondary | ICD-10-CM

## 2020-06-14 MED ORDER — MUPIROCIN 2 % EX OINT: 1.0000 "application " | TOPICAL_OINTMENT | Freq: Two times a day (BID) | CUTANEOUS | 1 refills | Status: DC

## 2020-06-14 NOTE — Patient Instructions (Signed)
Laser treatment- [facility]

## 2020-06-16 ENCOUNTER — Encounter: Payer: Self-pay | Admitting: Physician Assistant

## 2020-06-16 NOTE — Progress Notes (Signed)
   New Patient   Subjective  Cynthia Deleon is a 65 y.o. female who presents for the following: New Patient (Initial Visit) (Patient here today for skin check. Recheck spot on the right hairline it was previously LN2 in the past and it never went away.).   The following portions of the chart were reviewed this encounter and updated as appropriate:      Objective  Well appearing patient in no apparent distress; mood and affect are within normal limits.  A full examination was performed including scalp, head, eyes, ears, nose, lips, neck, chest, axillae, abdomen, back, buttocks, bilateral upper extremities, bilateral lower extremities, hands, feet, fingers, toes, fingernails, and toenails. All findings within normal limits unless otherwise noted below.  Objective  Head to toe: Full body skin examination- no atypical moles or non mole skin cancer  Objective  Mid Forehead: Stuck-on, waxy, tan-brown papules and plaques. --Discussed benign etiology and prognosis.   Objective  Mid Back: Multiple Raised red papule   Objective  Left Lower Leg - Posterior: Patient states was cut by tree. Linear superficial laceration. No signs of infection.   Assessment & Plan  Skin exam for malignant neoplasm Head to toe  Yearly skin check  Seborrheic keratosis Mid Forehead  Okay to leave if stable  Hemangioma, unspecified site Mid Back  Okay to leave if stable  Sore on leg Left Lower Leg - Posterior  Use  Mupirocin 2 % Ointment. Call if problems  mupirocin ointment (BACTROBAN) 2 % - Left Lower Leg - Posterior     I,Kathrin Folden,acting as a scribe for Greenly Rarick, PA-C.,have documented all relevant documentation on the behalf of [place], PA-C,as directed by  [facility], PA-C while in the presence of Shrika Milos, PA-C.

## 2020-06-19 ENCOUNTER — Encounter (HOSPITAL_COMMUNITY): Payer: Self-pay

## 2020-06-19 NOTE — Progress Notes (Signed)
I have reached out to the patient after she was unable to make her recent LDCT appt. Patient is not comfortable reschedule at this time due to "all the COVID," she has requested to reassess in about a month.

## 2020-06-20 ENCOUNTER — Ambulatory Visit (INDEPENDENT_AMBULATORY_CARE_PROVIDER_SITE_OTHER): Payer: 59 | Admitting: Nurse Practitioner

## 2020-06-20 ENCOUNTER — Other Ambulatory Visit: Payer: Self-pay

## 2020-06-20 ENCOUNTER — Encounter: Payer: Self-pay | Admitting: Nurse Practitioner

## 2020-06-20 VITALS — BP 116/60 | HR 77 | Temp 97.5°F | Ht 69.0 in | Wt 174.4 lb

## 2020-06-20 DIAGNOSIS — E1122 Type 2 diabetes mellitus with diabetic chronic kidney disease: Secondary | ICD-10-CM

## 2020-06-20 DIAGNOSIS — N183 Type 2 diabetes mellitus with diabetic chronic kidney disease: Secondary | ICD-10-CM

## 2020-06-20 DIAGNOSIS — J441 Chronic obstructive pulmonary disease with (acute) exacerbation: Secondary | ICD-10-CM

## 2020-06-20 DIAGNOSIS — N393 Stress incontinence (female) (male): Secondary | ICD-10-CM

## 2020-06-20 DIAGNOSIS — F3164 Bipolar disorder, current episode mixed, severe, with psychotic features: Secondary | ICD-10-CM

## 2020-06-20 LAB — GLUCOSE HEMOCUE WAIVED: Glu Hemocue Waived: 179 mg/dL — ABNORMAL HIGH (ref 65–99)

## 2020-06-20 MED ORDER — TOLTERODINE TARTRATE ER 2 MG PO CP24: 2.0000 mg | ORAL_CAPSULE | Freq: Every day | ORAL | 1 refills | Status: DC

## 2020-06-20 MED ORDER — FLUCONAZOLE 150 MG PO TABS: 150.0000 mg | ORAL_TABLET | Freq: Once | ORAL | 0 refills | Status: AC

## 2020-06-20 MED ORDER — AZITHROMYCIN 250 MG PO TABS: ORAL_TABLET | ORAL | 0 refills | Status: DC

## 2020-06-20 MED ORDER — METFORMIN HCL 1000 MG PO TABS: 1000.0000 mg | ORAL_TABLET | Freq: Two times a day (BID) | ORAL | 1 refills | Status: DC

## 2020-06-20 NOTE — Assessment & Plan Note (Signed)
Diabetes mellitus not well controlled. Changed metformin 500 mg 2 tablets daily with food to 1000 mg tablet twice daily with food. Follow-up in 2 weeks with PCP. Rx sent to pharmacy Education provided with printed handouts given. Continue on diabetic diet and exercise regimen as tolerated.

## 2020-06-20 NOTE — Assessment & Plan Note (Signed)
Stress incontinence of urine not well controlled. Started patient on Detrol LA 4 mg tablet daily by mouth. Follow-up in 4 weeks. Education provided to patient with printed handouts given. Rx sent to pharmacy.

## 2020-06-20 NOTE — Progress Notes (Signed)
Acute Office Visit  Subjective:    [name], female    DOB: 1955-12-12, 65 y.o.   MRN: [id number]  Chief Complaint  Patient presents with  . Ear Pain    RIGHT  . Urinary Incontinence    Otalgia  There is pain in the left ear. This is a recurrent problem. The current episode started 1 to 4 weeks ago. The problem occurs constantly. The problem has been unchanged. There has been no fever. The pain is at a severity of 4/10. The pain is moderate. Pertinent negatives include no coughing, ear discharge, headaches, hearing loss or sore throat. She has tried nothing for the symptoms. There is no history of a chronic ear infection or hearing loss.   Past Medical History:  Diagnosis Date  . Arthritis    hands and knees  . Bipolar 1 disorder ([place])   . Borderline diabetic   . Chronic back pain   . Chronic neck pain   . Complication of anesthesia    high anxiety-does not want to be alone  . COPD (chronic obstructive pulmonary disease) ([place])   . Current use of estrogen therapy 01/12/2014  . Depression   . Diabetes mellitus without complication ([place])   . Diplopia   . Family history of adverse reaction to anesthesia    sister "gas in lungs"  . GERD (gastroesophageal reflux disease)   . Headache   . Hot flashes 12/15/2013  . Hypertension   . IBS (irritable bowel syndrome)   . Incontinence   . Kidney stone   . 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 Panic attacks   . Peptic ulcer   . Peripheral neuropathy   . Rosacea   . Shingles   . Sleep apnea    uses a cpap-with oxygen    Past Surgical History:  Procedure Laterality Date  . ABDOMINAL HYSTERECTOMY    . BIOPSY  04/27/2020   Procedure: BIOPSY;  Surgeon: Ronald Lobo, MD;  Location: [facility];  Service: Endoscopy;;  . BUNIONECTOMY WITH HAMMERTOE RECONSTRUCTION Right 12/10/2012   Procedure: RIGHT FIRST METATARSAL CHEVRON BUNION CORRECTION,  2 AND 3 HAMMERTOE CORRECTION , RIGHT 3 AND 4 TOE NAIL EXCISION ;   Surgeon: Wylene Simmer, MD;  Location: [facility];  Service: Orthopedics;  Laterality: Right;  . COLONOSCOPY WITH PROPOFOL N/A 04/27/2020   Procedure: COLONOSCOPY WITH PROPOFOL;  Surgeon: Ronald Lobo, MD;  Location: [facility];  Service: Endoscopy;  Laterality: N/A;  . FOOT ARTHRODESIS  2000   both feet  . LIPOSUCTION  03/2018  . RECTAL SURGERY    . TONSILLECTOMY      Family History  Problem Relation Age of Onset  . Diabetes Mother   . Other Mother        vertigo; chronic eye disease  . COPD Father   . COPD Sister   . Alcohol abuse Brother   . Diabetes Maternal Grandmother   . Diabetes Maternal Grandfather   . Diabetes Sister   . Other Sister        hearing problems  . Hyperlipidemia Sister   . Alcohol abuse Brother   . COPD Brother   . Alcohol abuse Brother   . Alcohol abuse Brother   . Other Brother        aneursym    Social History   Socioeconomic History  . Marital status: Divorced    Spouse name: Not on file  . Number of children: 5  . Years  of education: 9th  . Highest education level: Not on file  Occupational History  . Occupation: Disabled  Tobacco Use  . Smoking status: Former Smoker    Packs/day: 0.50    Years: 35.00    Pack years: 17.50    Types: Cigarettes    Quit date: 05/11/2019    Years since quitting: 1.1  . Smokeless tobacco: Never Used  Vaping Use  . Vaping Use: Never used  Substance and Sexual Activity  . Alcohol use: No    Alcohol/week: 0.0 standard drinks  . Drug use: No  . Sexual activity: Never    Birth control/protection: Surgical    Comment: occ cigarette  Other Topics Concern  . Not on file  Social History Narrative   Lives at home with home with her son (has five children).   Right-handed.   1 cup caffeine per day.   Social Determinants of Health   Financial Resource Strain: Not on file  Food Insecurity: No Food Insecurity  . Worried About Programme researcher, broadcasting/film/video in the Last Year: Never true  . Ran Out  of Food in the Last Year: Never true  Transportation Needs: No Transportation Needs  . Lack of Transportation (Medical): No  . Lack of Transportation (Non-Medical): No  Physical Activity: Not on file  Stress: Not on file  Social Connections: Not on file  Intimate Partner Violence: Not on file    Outpatient Medications Prior to Visit  Medication Sig Dispense Refill  . ARIPiprazole (ABILIFY) 10 MG tablet Take 10 mg by mouth daily.    [name] atorvastatin (LIPITOR) 40 MG tablet TAKE ONE TABLET BY MOUTH ONCE DAILY. 90 tablet 1  . BELSOMRA 20 MG TABS Take 1 tablet by mouth at bedtime as needed.    . cyclobenzaprine (FLEXERIL) 10 MG tablet     . diazepam (VALIUM) 5 MG tablet Take 5 mg by mouth in the morning and at bedtime.     . fluticasone (FLONASE) 50 MCG/ACT nasal spray Place 2 sprays into both nostrils daily. 16 g 5  . gabapentin (NEURONTIN) 100 MG capsule SMARTSIG:1 By Mouth 4-5 Times Daily    . glipiZIDE (GLUCOTROL XL) 5 MG 24 hr tablet Take 1 tablet (5 mg total) by mouth 2 (two) times daily with a meal. 30 tablet 2  . glucose blood (CONTOUR NEXT TEST) test strip Test BS 4 times daily Dx E11.9 400 strip 3  . lamoTRIgine (LAMICTAL) 200 MG tablet Take 200 mg by mouth at bedtime. For mood disorder.    . levocetirizine (XYZAL) 5 MG tablet TAKE ONE TABLET BY MOUTH IN THE EVENING. 30 tablet 5  . lisinopril (ZESTRIL) 5 MG tablet TAKE (1) TABLET BY MOUTH ONCE DAILY. 30 tablet 0  . metFORMIN (GLUCOPHAGE-XR) 500 MG 24 hr tablet Take 1 tablet (500 mg total) by mouth 2 (two) times daily with a meal. 30 tablet 2  . mupirocin ointment (BACTROBAN) 2 % Apply 1 application topically 2 (two) times daily. 22 g 1  . nystatin (MYCOSTATIN) 100000 UNIT/ML suspension Take 5 mLs (500,000 Units total) by mouth 4 (four) times daily. 60 mL 0  . ondansetron (ZOFRAN ODT) 4 MG disintegrating tablet Take 1 tablet (4 mg total) by mouth every 8 (eight) hours as needed for nausea or vomiting. 20 tablet 0  . OneTouch Delica  Lancets 30G MISC Use to test blood sugar twice daily. DX: E11.9 100 each 3  . oxyCODONE (ROXICODONE) 15 MG immediate release tablet Take 7.5 mg by mouth  every 6 (six) hours as needed.     . pantoprazole (PROTONIX) 40 MG tablet Take 1 tablet (40 mg total) by mouth daily. 30 tablet 3  . pregabalin (LYRICA) 150 MG capsule TAKE (1) CAPSULE BY MOUTH TWICE DAILY. 60 capsule 0  . PREMARIN 0.3 MG tablet TAKE (1) TABLET BY MOUTH ONCE DAILY. 30 tablet 7  . sitaGLIPtin (JANUVIA) 100 MG tablet Take 1 tablet (100 mg total) by mouth daily. 30 tablet 3  . temazepam (RESTORIL) 15 MG capsule Take 15 mg by mouth at bedtime.    . traZODone (DESYREL) 100 MG tablet Take 100 mg by mouth at bedtime.    [name] umeclidinium-vilanterol (ANORO ELLIPTA) 62.5-25 MCG/INH AEPB Inhale 1 puff into the lungs daily. 30 each 6  . valACYclovir (VALTREX) 500 MG tablet Take 1 tablet (500 mg total) by mouth daily as needed (shingles flare up). 30 tablet 4   No facility-administered medications prior to visit.    Allergies  Allergen Reactions  . Chantix [Varenicline] Other (See Comments)    Altered mental status-Per patient was put on allergy list by Dr Lorriane Shire bu patient staes she is not allergic to this and is currently taking it.   . Divalproex Sodium Other (See Comments)    Hallucinations  Other reaction(s): Confusion  . Pseudoeph-Hydrocodone-Gg Nausea Only  . Sulfa Antibiotics Anaphylaxis and Nausea Only  . Varenicline Tartrate     Other reaction(s): Confusion  . Ambien [Zolpidem] Other (See Comments)    Causes sleep walking  . Codeine Nausea And Vomiting  . Hydrocodone Nausea And Vomiting  . Paxil [Paroxetine Hcl] Other (See Comments)    Causes ringing in the ears.   . Sulfur Other (See Comments)    Swelling of tongue  . Amoxicillin Rash    Has patient had a PCN reaction causing immediate rash, facial/tongue/throat swelling, SOB or lightheadedness with hypotension: No Has patient had a PCN reaction causing severe rash  involving mucus membranes or skin necrosis: No Has patient had a PCN reaction that required hospitalization: No Has patient had a PCN reaction occurring within the last 10 years: Yes If all of the above answers are "NO", then may proceed with Cephalosporin use.   . Tape Rash    Review of Systems  HENT: Positive for ear pain. Negative for ear discharge, hearing loss and sore throat.   Respiratory: Negative for cough.   Endocrine: Positive for polyuria.  Neurological: Negative for headaches.       Objective:    Physical Exam Constitutional:      General: She is awake.     Appearance: Normal appearance. She is well-groomed.     Interventions: Face mask in place.  HENT:     Head:     Comments: Right ear pain    Right Ear: There is no impacted cerumen.     Left Ear: There is no impacted cerumen.     Nose: Nose normal.  Eyes:     Conjunctiva/sclera: Conjunctivae normal.  Cardiovascular:     Rate and Rhythm: Normal rate and regular rhythm.     Pulses: Normal pulses.     Heart sounds: Normal heart sounds.  Pulmonary:     Effort: Pulmonary effort is normal.     Breath sounds: Normal breath sounds.  Abdominal:     General: Bowel sounds are normal.  Genitourinary:    Comments: Urine incontinence Musculoskeletal:        General: Normal range of motion.  Skin:  General: Skin is warm.  Neurological:     Mental Status: She is alert and oriented to person, place, and time.  Psychiatric:        Behavior: Behavior is cooperative.     BP 116/60   Pulse 77   Temp (!) 97.5 F (36.4 C)   Ht 5\' 9"  (1.753 m)   Wt 174 lb 6.4 oz (79.1 kg)   SpO2 95%   BMI 25.75 kg/m  Wt Readings from Last 3 Encounters:  06/20/20 174 lb 6.4 oz (79.1 kg)  06/08/20 174 lb 4 oz (79 kg)  04/27/20 170 lb (77.1 kg)    Health Maintenance Due  Topic Date Due  . OPHTHALMOLOGY EXAM  Never done      Lab Results  Component Value Date   TSH 1.060 03/07/2020   Lab Results  Component Value  Date   WBC 11.0 (H) 06/08/2020   HGB 15.8 06/08/2020   HCT 45.7 06/08/2020   MCV 91 06/08/2020   PLT 201 06/08/2020   Lab Results  Component Value Date   NA 141 06/08/2020   K 4.9 06/08/2020   CO2 24 06/08/2020   GLUCOSE 187 (H) 06/08/2020   BUN 15 06/08/2020   CREATININE 0.72 06/08/2020   BILITOT 0.4 03/07/2020   ALKPHOS 68 03/07/2020   AST 11 03/07/2020   ALT 7 03/07/2020   PROT 6.3 03/07/2020   ALBUMIN 4.6 03/07/2020   CALCIUM 9.7 06/08/2020   ANIONGAP 8 04/22/2018   Lab Results  Component Value Date   CHOL 155 06/08/2020   Lab Results  Component Value Date   HDL 45 06/08/2020   Lab Results  Component Value Date   LDLCALC 84 06/08/2020   Lab Results  Component Value Date   TRIG 146 06/08/2020   Lab Results  Component Value Date   CHOLHDL 3.4 06/08/2020   Lab Results  Component Value Date   HGBA1C 7.4 (H) 06/08/2020       Assessment & Plan:   Problem List Items Addressed This Visit      Endocrine   Diabetes mellitus with stage 3 chronic kidney disease ([place]) - Primary    Diabetes mellitus not well controlled. Changed metformin 500 mg 2 tablets daily with food to 1000 mg tablet twice daily with food. Follow-up in 2 weeks with PCP. Rx sent to pharmacy Education provided with printed handouts given. Continue on diabetic diet and exercise regimen as tolerated.        Relevant Medications   metFORMIN (GLUCOPHAGE) 1000 MG tablet   Other Relevant Orders   Glucose Hemocue Waived (Completed)     Other   Stress incontinence of urine    Stress incontinence of urine not well controlled. Started patient on Detrol LA 4 mg tablet daily by mouth. Follow-up in 4 weeks. Education provided to patient with printed handouts given. Rx sent to pharmacy.        Relevant Medications   tolterodine (DETROL LA) 2 MG 24 hr capsule    Other Visit Diagnoses    COPD with acute exacerbation ([place])   (Chronic)     Relevant Medications   azithromycin (ZITHROMAX) 250 MG  tablet   Bipolar disorder, current episode mixed, severe, with psychotic features ([place])   (Chronic)         Meds ordered this encounter  Medications  . tolterodine (DETROL LA) 2 MG 24 hr capsule    Sig: Take 1 capsule (2 mg total) by mouth daily.    Dispense:  30 capsule    Refill:  1    Order Specific Question:   Supervising Provider    Answer:   Janora Norlander [[id number]]  . fluconazole (DIFLUCAN) 150 MG tablet    Sig: Take 1 tablet (150 mg total) by mouth once for 1 dose.    Dispense:  1 tablet    Refill:  0    Order Specific Question:   Supervising Provider    Answer:   Janora Norlander [[id number]]  . azithromycin (ZITHROMAX) 250 MG tablet    Sig: 500 mg [2 tablets] day 1, 250 mg tablet [1 tablet] dated 2-5    Dispense:  6 tablet    Refill:  0    Order Specific Question:   Supervising Provider    Answer:   Janora Norlander [[id number]]  . metFORMIN (GLUCOPHAGE) 1000 MG tablet    Sig: Take 1 tablet (1,000 mg total) by mouth 2 (two) times daily with a meal.    Dispense:  90 tablet    Refill:  1    Order Specific Question:   Supervising Provider    Answer:   Janora Norlander [[id number]]     Ivy Lynn, NP

## 2020-06-21 ENCOUNTER — Telehealth: Payer: Self-pay | Admitting: Pharmacist

## 2020-06-21 MED ORDER — OXYBUTYNIN CHLORIDE ER 5 MG PO TB24: 5.0000 mg | ORAL_TABLET | Freq: Every day | ORAL | 1 refills | Status: DC

## 2020-06-21 NOTE — Telephone Encounter (Signed)
Oxybutynin ordered

## 2020-06-21 NOTE — Telephone Encounter (Signed)
detrol LA generic not covered  Insurance prefers oxybutynin er corrected

## 2020-07-04 ENCOUNTER — Encounter: Payer: Self-pay | Admitting: Family Medicine

## 2020-07-04 ENCOUNTER — Other Ambulatory Visit: Payer: Self-pay

## 2020-07-04 ENCOUNTER — Ambulatory Visit (INDEPENDENT_AMBULATORY_CARE_PROVIDER_SITE_OTHER): Payer: 59 | Admitting: Family Medicine

## 2020-07-04 VITALS — BP 113/62 | HR 84 | Temp 97.5°F | Ht 69.0 in | Wt 177.2 lb

## 2020-07-04 DIAGNOSIS — R2232 Localized swelling, mass and lump, left upper limb: Secondary | ICD-10-CM

## 2020-07-04 DIAGNOSIS — M79675 Pain in left toe(s): Secondary | ICD-10-CM | POA: Diagnosis not present

## 2020-07-04 DIAGNOSIS — M25532 Pain in left wrist: Secondary | ICD-10-CM

## 2020-07-04 DIAGNOSIS — N183 Chronic kidney disease, stage 3 unspecified: Secondary | ICD-10-CM

## 2020-07-04 DIAGNOSIS — E1122 Type 2 diabetes mellitus with diabetic chronic kidney disease: Secondary | ICD-10-CM | POA: Diagnosis not present

## 2020-07-04 DIAGNOSIS — E538 Deficiency of other specified B group vitamins: Secondary | ICD-10-CM

## 2020-07-04 DIAGNOSIS — N393 Stress incontinence (female) (male): Secondary | ICD-10-CM

## 2020-07-04 DIAGNOSIS — R232 Flushing: Secondary | ICD-10-CM

## 2020-07-04 MED ORDER — OXYBUTYNIN CHLORIDE ER 10 MG PO TB24: 10.0000 mg | ORAL_TABLET | Freq: Every day | ORAL | 2 refills | Status: DC

## 2020-07-04 NOTE — Progress Notes (Addendum)
Assessment & Plan:  1. Diabetes mellitus with stage 3 chronic kidney disease (HCC) - Blood glucose levels improving.  Continue to monitor and bring her glucometer or a log with her to her appointments. - Ambulatory referral to Podiatry  2. Pain of left great toe - No abnormality noted on physical exam. - Ambulatory referral to Podiatry  3. Hot flashes - Encouraged to try Estroven or black cohosh. - Thyroid Panel With TSH  4. Vitamin B12 deficiency - Vitamin B12  5. Stress incontinence of urine - Not yet controlled. Ditropan increased from 5 mg to 10 mg once daily. According to chart review patient has previously failed therapy with darifenacin and solifenacin. Tolterodine was not covered by insurance when it was sent in previously.  6. Wrist lump, left - Ambulatory referral to Hand Surgery  7. Left wrist pain - Ambulatory referral to Hand Surgery   Return as scheduled.  [name], MSN, APRN, FNP-C [facility]  Subjective:    [name], female    DOB: 1956/06/02, 65 y.o.   MRN: [id number]  Patient Care Team: Loman Brooklyn, FNP as PCP - General (Family Medicine) Suella Broad, MD as Consulting Physician (Physical Medicine and Rehabilitation) Norma Fredrickson, MD as Consulting Physician (Psychiatry) Ilean China, RN as Registered Nurse Phillips Odor, MD as Consulting Physician (Neurology) Lavera Guise, [facility] (Pharmacist) Starlyn Skeans as Physician Assistant (Dermatology)   Chief Complaint:  Chief Complaint  Patient presents with  . Diabetes    2 week follow up    . Referral    Podiatrist   . Night Sweats    Patient states this has been going on x 3 months     HPI: Cynthia Deleon is a 65 y.o. female presenting on 07/04/2020 for Diabetes (2 week follow up /), Referral (Podiatrist ), and Night Sweats (Patient states this has been going on x 3 months/)  Diabetes: Last month patient's Metformin  and glipizide were both increased from once daily to twice daily due to blood sugars in the three and 400s.  She reports today her blood sugars are doing much better.  In the past 2 weeks her blood sugars have ranged mostly 150-170s.  Patient would like a referral to a podiatrist.  She has diabetes.  She states that her left great toe is sore.  Also reports she had her toenails removed years ago and she wonders if one is growing back abnormally, causing the soreness.  Patient reports night sweats that have been going on for at least the past 3 months.  She does take Premarin 0.3 mg once daily.  Patient was recently started on Ditropan XL for urinary stress incontinence.  She states it is working but she feels it could be better and would like to increase the dosage.  Patient would like her B12 level drawn.  She states years ago she was told by her neurologist to keep a close eye on this due to a deficiency.  Patient reports a spot on her left wrist causing pain that she would like taking care of.  Patient likes to crochet and states it makes it difficult.   Social history:  Relevant past medical, surgical, family and social history reviewed and updated as indicated. Interim medical history since our last visit reviewed.  Allergies and medications reviewed and updated.  DATA REVIEWED: CHART IN EPIC  ROS: Negative unless specifically indicated above in HPI.    Current Outpatient  Medications:  .  ARIPiprazole (ABILIFY) 10 MG tablet, Take 10 mg by mouth daily., Disp: , Rfl:  .  atorvastatin (LIPITOR) 40 MG tablet, TAKE ONE TABLET BY MOUTH ONCE DAILY., Disp: 90 tablet, Rfl: 1 .  BELSOMRA 20 MG TABS, Take 1 tablet by mouth at bedtime as needed., Disp: , Rfl:  .  cyclobenzaprine (FLEXERIL) 10 MG tablet, , Disp: , Rfl:  .  diazepam (VALIUM) 5 MG tablet, Take 5 mg by mouth in the morning and at bedtime. , Disp: , Rfl:  .  fluticasone (FLONASE) 50 MCG/ACT nasal spray, Place 2 sprays into both  nostrils daily., Disp: 16 g, Rfl: 5 .  gabapentin (NEURONTIN) 100 MG capsule, SMARTSIG:1 By Mouth 4-5 Times Daily, Disp: , Rfl:  .  glipiZIDE (GLUCOTROL XL) 5 MG 24 hr tablet, Take 1 tablet (5 mg total) by mouth 2 (two) times daily with a meal., Disp: 30 tablet, Rfl: 2 .  glucose blood (CONTOUR NEXT TEST) test strip, Test BS 4 times daily Dx E11.9, Disp: 400 strip, Rfl: 3 .  lamoTRIgine (LAMICTAL) 200 MG tablet, Take 200 mg by mouth at bedtime. For mood disorder., Disp: , Rfl:  .  levocetirizine (XYZAL) 5 MG tablet, TAKE ONE TABLET BY MOUTH IN THE EVENING., Disp: 30 tablet, Rfl: 5 .  lisinopril (ZESTRIL) 5 MG tablet, TAKE (1) TABLET BY MOUTH ONCE DAILY., Disp: 30 tablet, Rfl: 0 .  metFORMIN (GLUCOPHAGE) 1000 MG tablet, Take 1 tablet (1,000 mg total) by mouth 2 (two) times daily with a meal., Disp: 90 tablet, Rfl: 1 .  mupirocin ointment (BACTROBAN) 2 %, Apply 1 application topically 2 (two) times daily., Disp: 22 g, Rfl: 1 .  nystatin (MYCOSTATIN) 100000 UNIT/ML suspension, Take 5 mLs (500,000 Units total) by mouth 4 (four) times daily., Disp: 60 mL, Rfl: 0 .  ondansetron (ZOFRAN ODT) 4 MG disintegrating tablet, Take 1 tablet (4 mg total) by mouth every 8 (eight) hours as needed for nausea or vomiting., Disp: 20 tablet, Rfl: 0 .  OneTouch Delica Lancets 99991111 MISC, Use to test blood sugar twice daily. DX: E11.9, Disp: 100 each, Rfl: 3 .  oxybutynin (DITROPAN-XL) 5 MG 24 hr tablet, Take 1 tablet (5 mg total) by mouth at bedtime., Disp: 30 tablet, Rfl: 1 .  oxyCODONE (ROXICODONE) 15 MG immediate release tablet, Take 7.5 mg by mouth every 6 (six) hours as needed. , Disp: , Rfl:  .  pantoprazole (PROTONIX) 40 MG tablet, Take 1 tablet (40 mg total) by mouth daily., Disp: 30 tablet, Rfl: 3 .  pregabalin (LYRICA) 150 MG capsule, TAKE (1) CAPSULE BY MOUTH TWICE DAILY., Disp: 60 capsule, Rfl: 0 .  PREMARIN 0.3 MG tablet, TAKE (1) TABLET BY MOUTH ONCE DAILY., Disp: 30 tablet, Rfl: 7 .  sitaGLIPtin (JANUVIA) 100  MG tablet, Take 1 tablet (100 mg total) by mouth daily., Disp: 30 tablet, Rfl: 3 .  temazepam (RESTORIL) 15 MG capsule, Take 15 mg by mouth at bedtime., Disp: , Rfl:  .  traZODone (DESYREL) 100 MG tablet, Take 100 mg by mouth at bedtime., Disp: , Rfl:  .  umeclidinium-vilanterol (ANORO ELLIPTA) 62.5-25 MCG/INH AEPB, Inhale 1 puff into the lungs daily., Disp: 30 each, Rfl: 6 .  valACYclovir (VALTREX) 500 MG tablet, Take 1 tablet (500 mg total) by mouth daily as needed (shingles flare up)., Disp: 30 tablet, Rfl: 4   Allergies  Allergen Reactions  . Chantix [Varenicline] Other (See Comments)    Altered mental status-Per patient was put on allergy  list by Dr Delbert Harness bu patient staes she is not allergic to this and is currently taking it.   . Divalproex Sodium Other (See Comments)    Hallucinations  Other reaction(s): Confusion  . Pseudoeph-Hydrocodone-Gg Nausea Only  . Sulfa Antibiotics Anaphylaxis and Nausea Only  . Varenicline Tartrate     Other reaction(s): Confusion  . Ambien [Zolpidem] Other (See Comments)    Causes sleep walking  . Codeine Nausea And Vomiting  . Elemental Sulfur Other (See Comments)    Swelling of tongue  . Hydrocodone Nausea And Vomiting  . Paxil [Paroxetine Hcl] Other (See Comments)    Causes ringing in the ears.   . Amoxicillin Rash    Has patient had a PCN reaction causing immediate rash, facial/tongue/throat swelling, SOB or lightheadedness with hypotension: No Has patient had a PCN reaction causing severe rash involving mucus membranes or skin necrosis: No Has patient had a PCN reaction that required hospitalization: No Has patient had a PCN reaction occurring within the last 10 years: Yes If all of the above answers are "NO", then may proceed with Cephalosporin use.   . Tape Rash   Past Medical History:  Diagnosis Date  . Arthritis    hands and knees  . Bipolar 1 disorder (HCC)   . Borderline diabetic   . Chronic back pain   . Chronic neck pain    . Complication of anesthesia    high anxiety-does not want to be alone  . COPD (chronic obstructive pulmonary disease) (HCC)   . Current use of estrogen therapy 01/12/2014  . Depression   . Diabetes mellitus without complication (HCC)   . Diplopia   . Family history of adverse reaction to anesthesia    sister "gas in lungs"  . GERD (gastroesophageal reflux disease)   . Headache   . Hot flashes 12/15/2013  . Hypertension   . IBS (irritable bowel syndrome)   . Incontinence   . Kidney stone   . 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HCC)   . Panic attacks   . Peptic ulcer   . Peripheral neuropathy   . Rosacea   . Shingles   . Sleep apnea    uses a cpap-with oxygen    Past Surgical History:  Procedure Laterality Date  . ABDOMINAL HYSTERECTOMY    . BIOPSY  04/27/2020   Procedure: BIOPSY;  Surgeon: Bernette Redbird, MD;  Location: [facility];  Service: Endoscopy;;  . BUNIONECTOMY WITH HAMMERTOE RECONSTRUCTION Right 12/10/2012   Procedure: RIGHT FIRST METATARSAL CHEVRON BUNION CORRECTION,  2 AND 3 HAMMERTOE CORRECTION , RIGHT 3 AND 4 TOE NAIL EXCISION ;  Surgeon: Toni Arthurs, MD;  Location: [facility];  Service: Orthopedics;  Laterality: Right;  . COLONOSCOPY WITH PROPOFOL N/A 04/27/2020   Procedure: COLONOSCOPY WITH PROPOFOL;  Surgeon: Bernette Redbird, MD;  Location: [facility];  Service: Endoscopy;  Laterality: N/A;  . FOOT ARTHRODESIS  2000   both feet  . LIPOSUCTION  03/2018  . RECTAL SURGERY    . TONSILLECTOMY      Social History   Socioeconomic History  . Marital status: Divorced    Spouse name: Not on file  . Number of children: 5  . Years of education: 9th  . Highest education level: Not on file  Occupational History  . Occupation: Disabled  Tobacco Use  . Smoking status: Former Smoker    Packs/day: 0.50    Years: 35.00    Pack years: 17.50    Types: Cigarettes  Quit date: 05/11/2019    Years since quitting: 1.1  . Smokeless tobacco: Never Used   Vaping Use  . Vaping Use: Never used  Substance and Sexual Activity  . Alcohol use: No    Alcohol/week: 0.0 standard drinks  . Drug use: No  . Sexual activity: Never    Birth control/protection: Surgical    Comment: occ cigarette  Other Topics Concern  . Not on file  Social History Narrative   Lives at home with home with her son (has five children).   Right-handed.   1 cup caffeine per day.   Social Determinants of Health   Financial Resource Strain: Not on file  Food Insecurity: No Food Insecurity  . Worried About Charity fundraiser in the Last Year: Never true  . Ran Out of Food in the Last Year: Never true  Transportation Needs: No Transportation Needs  . Lack of Transportation (Medical): No  . Lack of Transportation (Non-Medical): No  Physical Activity: Not on file  Stress: Not on file  Social Connections: Not on file  Intimate Partner Violence: Not on file        Objective:    BP 113/62   Pulse 84   Temp (!) 97.5 F (36.4 C) (Temporal)   Ht 5\' 9"  (1.753 m)   Wt 177 lb 3.2 oz (80.4 kg)   SpO2 94%   BMI 26.17 kg/m   Wt Readings from Last 3 Encounters:  07/04/20 177 lb 3.2 oz (80.4 kg)  06/20/20 174 lb 6.4 oz (79.1 kg)  06/08/20 174 lb 4 oz (79 kg)    Physical Exam Vitals reviewed.  Constitutional:      General: She is not in acute distress.    Appearance: Normal appearance. She is overweight. She is not ill-appearing, toxic-appearing or diaphoretic.  HENT:     Head: Normocephalic and atraumatic.  Eyes:     General: No scleral icterus.       Right eye: No discharge.        Left eye: No discharge.     Conjunctiva/sclera: Conjunctivae normal.  Cardiovascular:     Rate and Rhythm: Normal rate and regular rhythm.     Heart sounds: Normal heart sounds. No murmur heard. No friction rub. No gallop.   Pulmonary:     Effort: Pulmonary effort is normal. No respiratory distress.     Breath sounds: Normal breath sounds. No stridor. No wheezing, rhonchi or  rales.  Musculoskeletal:        General: Normal range of motion.     Left wrist: Tenderness (thumb side of wrist where she has what feels like a calcium deposit) present.     Cervical back: Normal range of motion.  Feet:     Right foot:     Skin integrity: Skin integrity normal.     Left foot:     Skin integrity: Skin integrity normal.  Skin:    General: Skin is warm and dry.     Capillary Refill: Capillary refill takes less than 2 seconds.  Neurological:     General: No focal deficit present.     Mental Status: She is alert and oriented to person, place, and time. Mental status is at baseline.  Psychiatric:        Mood and Affect: Mood normal.        Behavior: Behavior normal.        Thought Content: Thought content normal.        Judgment: Judgment  normal.     Lab Results  Component Value Date   TSH 1.060 03/07/2020   Lab Results  Component Value Date   WBC 11.0 (H) 06/08/2020   HGB 15.8 06/08/2020   HCT 45.7 06/08/2020   MCV 91 06/08/2020   PLT 201 06/08/2020   Lab Results  Component Value Date   NA 141 06/08/2020   K 4.9 06/08/2020   CO2 24 06/08/2020   GLUCOSE 187 (H) 06/08/2020   BUN 15 06/08/2020   CREATININE 0.72 06/08/2020   BILITOT 0.4 03/07/2020   ALKPHOS 68 03/07/2020   AST 11 03/07/2020   ALT 7 03/07/2020   PROT 6.3 03/07/2020   ALBUMIN 4.6 03/07/2020   CALCIUM 9.7 06/08/2020   ANIONGAP 8 04/22/2018   Lab Results  Component Value Date   CHOL 155 06/08/2020   Lab Results  Component Value Date   HDL 45 06/08/2020   Lab Results  Component Value Date   LDLCALC 84 06/08/2020   Lab Results  Component Value Date   TRIG 146 06/08/2020   Lab Results  Component Value Date   CHOLHDL 3.4 06/08/2020   Lab Results  Component Value Date   HGBA1C 7.4 (H) 06/08/2020

## 2020-07-04 NOTE — Patient Instructions (Signed)
Estroven or [employer] for hot flashes.

## 2020-07-05 ENCOUNTER — Ambulatory Visit: Payer: BC Managed Care – PPO | Admitting: Neurology

## 2020-07-05 ENCOUNTER — Encounter: Payer: Self-pay | Admitting: Neurology

## 2020-07-05 LAB — THYROID PANEL WITH TSH
Free Thyroxine Index: 1.8 (ref 1.2–4.9)
T3 Uptake Ratio: 21 % — ABNORMAL LOW (ref 24–39)
T4, Total: 8.5 ug/dL (ref 4.5–12.0)
TSH: 1.22 u[IU]/mL (ref 0.450–4.500)

## 2020-07-05 LAB — VITAMIN B12: Vitamin B-12: 514 pg/mL (ref 232–1245)

## 2020-07-05 NOTE — Progress Notes (Deleted)
PATIENT: Cynthia Deleon DOB: 09/15/1955  REASON FOR VISIT: follow up HISTORY FROM: patient  HISTORY OF PRESENT ILLNESS: Today 07/05/20  HISTORY  Cynthia Deleon is a 65 year old female, seen in request by primary care nurse practitioner [name] for evaluation of double vision,  I reviewed and summarized the referring note.  Past medical history Bipolar disorder, polypharmacy treatment, Hyperlipidemia Diabetes, COPD Chronic urinary urgency Obstructive sleep apnea, using CPAP machine,  I saw her in 2018 for progressive bilateral lower extremity paresthesia since 2012, EMG nerve conduction study in November 2017 showed no large fiber peripheral neuropathy  The diagnosis of small fiber neuropathy was confirmed by skin biopsy in March 2018, there was reduced epidermal nerve fiber density  She reported few recurrent episode of double vision since September 2021, it will happen while driving, the dividing line between lanes becomes blurry, lasting for 30 minutes, she denies dizziness, has baseline chronic low back pain, mild unsteady gait, she denies lateralized motor or sensory deficit during the spell.  She was seen by ophthalmologist, was told normal  She denies ptosis, no bulbar weakness, no limb muscle weakness.  Update July 05, 2020 SS:   REVIEW OF SYSTEMS: Out of a complete 14 system review of symptoms, the patient complains only of the following symptoms, and all other reviewed systems are negative.  ALLERGIES: Allergies  Allergen Reactions  . Chantix [Varenicline] Other (See Comments)    Altered mental status-Per patient was put on allergy list by Dr Lorriane Shire bu patient staes she is not allergic to this and is currently taking it.   . Divalproex Sodium Other (See Comments)    Hallucinations  Other reaction(s): Confusion  . Pseudoeph-Hydrocodone-Gg Nausea Only  . Sulfa Antibiotics Anaphylaxis and Nausea Only  . Varenicline Tartrate     Other  reaction(s): Confusion  . Ambien [Zolpidem] Other (See Comments)    Causes sleep walking  . Codeine Nausea And Vomiting  . Elemental Sulfur Other (See Comments)    Swelling of tongue  . Hydrocodone Nausea And Vomiting  . Paxil [Paroxetine Hcl] Other (See Comments)    Causes ringing in the ears.   . Amoxicillin Rash    Has patient had a PCN reaction causing immediate rash, facial/tongue/throat swelling, SOB or lightheadedness with hypotension: No Has patient had a PCN reaction causing severe rash involving mucus membranes or skin necrosis: No Has patient had a PCN reaction that required hospitalization: No Has patient had a PCN reaction occurring within the last 10 years: Yes If all of the above answers are "NO", then may proceed with Cephalosporin use.   . Tape Rash    HOME MEDICATIONS: Outpatient Medications Prior to Visit  Medication Sig Dispense Refill  . ARIPiprazole (ABILIFY) 10 MG tablet Take 10 mg by mouth daily.    [name] atorvastatin (LIPITOR) 40 MG tablet TAKE ONE TABLET BY MOUTH ONCE DAILY. 90 tablet 1  . BELSOMRA 20 MG TABS Take 1 tablet by mouth at bedtime as needed.    . cyclobenzaprine (FLEXERIL) 10 MG tablet     . diazepam (VALIUM) 5 MG tablet Take 5 mg by mouth in the morning and at bedtime.     . fluticasone (FLONASE) 50 MCG/ACT nasal spray Place 2 sprays into both nostrils daily. 16 g 5  . gabapentin (NEURONTIN) 100 MG capsule SMARTSIG:1 By Mouth 4-5 Times Daily    . glipiZIDE (GLUCOTROL XL) 5 MG 24 hr tablet Take 1 tablet (5 mg total) by mouth 2 (two)  times daily with a meal. 30 tablet 2  . glucose blood (CONTOUR NEXT TEST) test strip Test BS 4 times daily Dx E11.9 400 strip 3  . lamoTRIgine (LAMICTAL) 200 MG tablet Take 200 mg by mouth at bedtime. For mood disorder.    . levocetirizine (XYZAL) 5 MG tablet TAKE ONE TABLET BY MOUTH IN THE EVENING. 30 tablet 5  . lisinopril (ZESTRIL) 5 MG tablet TAKE (1) TABLET BY MOUTH ONCE DAILY. 30 tablet 0  . metFORMIN (GLUCOPHAGE)  1000 MG tablet Take 1 tablet (1,000 mg total) by mouth 2 (two) times daily with a meal. 90 tablet 1  . mupirocin ointment (BACTROBAN) 2 % Apply 1 application topically 2 (two) times daily. 22 g 1  . nystatin (MYCOSTATIN) 100000 UNIT/ML suspension Take 5 mLs (500,000 Units total) by mouth 4 (four) times daily. 60 mL 0  . ondansetron (ZOFRAN ODT) 4 MG disintegrating tablet Take 1 tablet (4 mg total) by mouth every 8 (eight) hours as needed for nausea or vomiting. 20 tablet 0  . OneTouch Delica Lancets 93A MISC Use to test blood sugar twice daily. DX: E11.9 100 each 3  . oxybutynin (DITROPAN-XL) 10 MG 24 hr tablet Take 1 tablet (10 mg total) by mouth at bedtime. 30 tablet 2  . oxyCODONE (ROXICODONE) 15 MG immediate release tablet Take 7.5 mg by mouth every 6 (six) hours as needed.     . pantoprazole (PROTONIX) 40 MG tablet Take 1 tablet (40 mg total) by mouth daily. 30 tablet 3  . pregabalin (LYRICA) 150 MG capsule TAKE (1) CAPSULE BY MOUTH TWICE DAILY. 60 capsule 0  . PREMARIN 0.3 MG tablet TAKE (1) TABLET BY MOUTH ONCE DAILY. 30 tablet 7  . sitaGLIPtin (JANUVIA) 100 MG tablet Take 1 tablet (100 mg total) by mouth daily. 30 tablet 3  . temazepam (RESTORIL) 15 MG capsule Take 15 mg by mouth at bedtime.    . traZODone (DESYREL) 100 MG tablet Take 100 mg by mouth at bedtime.    [name] umeclidinium-vilanterol (ANORO ELLIPTA) 62.5-25 MCG/INH AEPB Inhale 1 puff into the lungs daily. 30 each 6  . valACYclovir (VALTREX) 500 MG tablet Take 1 tablet (500 mg total) by mouth daily as needed (shingles flare up). 30 tablet 4   No facility-administered medications prior to visit.    PAST MEDICAL HISTORY: Past Medical History:  Diagnosis Date  . Arthritis    hands and knees  . Bipolar 1 disorder ([place])   . Borderline diabetic   . Chronic back pain   . Chronic neck pain   . Complication of anesthesia    high anxiety-does not want to be alone  . COPD (chronic obstructive pulmonary disease) ([place])   . Current use  of estrogen therapy 01/12/2014  . Depression   . Diabetes mellitus without complication ([place])   . Diplopia   . Family history of adverse reaction to anesthesia    sister "gas in lungs"  . GERD (gastroesophageal reflux disease)   . Headache   . Hot flashes 12/15/2013  . Hypertension   . IBS (irritable bowel syndrome)   . Incontinence   . Kidney stone   . 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 Panic attacks   . Peptic ulcer   . Peripheral neuropathy   . Rosacea   . Shingles   . Sleep apnea    uses a cpap-with oxygen    PAST SURGICAL HISTORY: Past Surgical History:  Procedure Laterality Date  . ABDOMINAL HYSTERECTOMY    .  BIOPSY  04/27/2020   Procedure: BIOPSY;  Surgeon: Ronald Lobo, MD;  Location: [facility];  Service: Endoscopy;;  . BUNIONECTOMY WITH HAMMERTOE RECONSTRUCTION Right 12/10/2012   Procedure: RIGHT FIRST METATARSAL CHEVRON BUNION CORRECTION,  2 AND 3 HAMMERTOE CORRECTION , RIGHT 3 AND 4 TOE NAIL EXCISION ;  Surgeon: Wylene Simmer, MD;  Location: [facility];  Service: Orthopedics;  Laterality: Right;  . COLONOSCOPY WITH PROPOFOL N/A 04/27/2020   Procedure: COLONOSCOPY WITH PROPOFOL;  Surgeon: Ronald Lobo, MD;  Location: [facility];  Service: Endoscopy;  Laterality: N/A;  . FOOT ARTHRODESIS  2000   both feet  . LIPOSUCTION  03/2018  . RECTAL SURGERY    . TONSILLECTOMY      FAMILY HISTORY: Family History  Problem Relation Age of Onset  . Diabetes Mother   . Other Mother        vertigo; chronic eye disease  . COPD Father   . COPD Sister   . Alcohol abuse Brother   . Diabetes Maternal Grandmother   . Diabetes Maternal Grandfather   . Diabetes Sister   . Other Sister        hearing problems  . Hyperlipidemia Sister   . Alcohol abuse Brother   . COPD Brother   . Alcohol abuse Brother   . Alcohol abuse Brother   . Other Brother        aneursym    SOCIAL HISTORY: Social History   Socioeconomic History  . Marital status: Divorced     Spouse name: Not on file  . Number of children: 5  . Years of education: 9th  . Highest education level: Not on file  Occupational History  . Occupation: Disabled  Tobacco Use  . Smoking status: Former Smoker    Packs/day: 0.50    Years: 35.00    Pack years: 17.50    Types: Cigarettes    Quit date: 05/11/2019    Years since quitting: 1.1  . Smokeless tobacco: Never Used  Vaping Use  . Vaping Use: Never used  Substance and Sexual Activity  . Alcohol use: No    Alcohol/week: 0.0 standard drinks  . Drug use: No  . Sexual activity: Never    Birth control/protection: Surgical    Comment: occ cigarette  Other Topics Concern  . Not on file  Social History Narrative   Lives at home with home with her son (has five children).   Right-handed.   1 cup caffeine per day.   Social Determinants of Health   Financial Resource Strain: Not on file  Food Insecurity: No Food Insecurity  . Worried About Charity fundraiser in the Last Year: Never true  . Ran Out of Food in the Last Year: Never true  Transportation Needs: No Transportation Needs  . Lack of Transportation (Medical): No  . Lack of Transportation (Non-Medical): No  Physical Activity: Not on file  Stress: Not on file  Social Connections: Not on file  Intimate Partner Violence: Not on file      PHYSICAL EXAM  There were no vitals filed for this visit. There is no height or weight on file to calculate BMI.  Generalized: Well developed, in no acute distress   Neurological examination  Mentation: Alert oriented to time, place, history taking. Follows all commands speech and language fluent Cranial nerve II-XII: Pupils were equal round reactive to light. Extraocular movements were full, visual field were full on confrontational test. Facial sensation and strength were normal. Uvula tongue  midline. Head turning and shoulder shrug  were normal and symmetric. Motor: The motor testing reveals 5 over 5 strength of all 4  extremities. Good symmetric motor tone is noted throughout.  Sensory: Sensory testing is intact to soft touch on all 4 extremities. No evidence of extinction is noted.  Coordination: Cerebellar testing reveals good finger-nose-finger and heel-to-shin bilaterally.  Gait and station: Gait is normal. Tandem gait is normal. Romberg is negative. No drift is seen.  Reflexes: Deep tendon reflexes are symmetric and normal bilaterally.   DIAGNOSTIC DATA (LABS, IMAGING, TESTING) - I reviewed patient records, labs, notes, testing and imaging myself where available.  Lab Results  Component Value Date   WBC 11.0 (H) 06/08/2020   HGB 15.8 06/08/2020   HCT 45.7 06/08/2020   MCV 91 06/08/2020   PLT 201 06/08/2020      Component Value Date/Time   NA 141 06/08/2020 0847   K 4.9 06/08/2020 0847   CL 101 06/08/2020 0847   CO2 24 06/08/2020 0847   GLUCOSE 187 (H) 06/08/2020 0847   GLUCOSE 100 (H) 04/22/2018 1527   BUN 15 06/08/2020 0847   CREATININE 0.72 06/08/2020 0847   CALCIUM 9.7 06/08/2020 0847   PROT 6.3 03/07/2020 0852   ALBUMIN 4.6 03/07/2020 0852   AST 11 03/07/2020 0852   ALT 7 03/07/2020 0852   ALKPHOS 68 03/07/2020 0852   BILITOT 0.4 03/07/2020 0852   GFRNONAA 89 06/08/2020 0847   GFRAA 102 06/08/2020 0847   Lab Results  Component Value Date   CHOL 155 06/08/2020   HDL 45 06/08/2020   LDLCALC 84 06/08/2020   TRIG 146 06/08/2020   CHOLHDL 3.4 06/08/2020   Lab Results  Component Value Date   HGBA1C 7.4 (H) 06/08/2020   Lab Results  Component Value Date   VITAMINB12 514 07/04/2020   Lab Results  Component Value Date   TSH 1.220 07/04/2020      ASSESSMENT AND PLAN 65 y.o. 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 here with:  1. Intermittent double vision -Differential diagnosis includes neuromuscular junctional disorder, need to rule out brainstem structural abnormality -MRI of the brain was normal -Acetylcholine receptor antibodies were negative  I spent 15 minutes with the patient. 50% of this time was spent   Butler Denmark, Maybee, DNP 07/05/2020, 5:52 AM [facility] [address] [place], [place] [address] [phone]

## 2020-07-06 ENCOUNTER — Other Ambulatory Visit: Payer: Self-pay | Admitting: Family Medicine

## 2020-07-06 ENCOUNTER — Other Ambulatory Visit: Payer: Self-pay | Admitting: Family

## 2020-07-06 DIAGNOSIS — E119 Type 2 diabetes mellitus without complications: Secondary | ICD-10-CM

## 2020-07-06 DIAGNOSIS — I10 Essential (primary) hypertension: Secondary | ICD-10-CM

## 2020-07-06 DIAGNOSIS — E11649 Type 2 diabetes mellitus with hypoglycemia without coma: Secondary | ICD-10-CM

## 2020-07-06 DIAGNOSIS — G6289 Other specified polyneuropathies: Secondary | ICD-10-CM

## 2020-07-08 NOTE — Telephone Encounter (Signed)
Dexilant refused as we have patient is on Protonix.

## 2020-07-12 ENCOUNTER — Telehealth: Payer: Self-pay

## 2020-07-12 MED ORDER — METFORMIN HCL ER 500 MG PO TB24: 1000.0000 mg | ORAL_TABLET | Freq: Two times a day (BID) | ORAL | 1 refills | Status: DC

## 2020-07-12 NOTE — Telephone Encounter (Signed)
Pt aware and very appreciative. 

## 2020-07-12 NOTE — Telephone Encounter (Signed)
I changed her Metformin to the extended release.  Please make sure she is aware this is a 500 mg tablet and not a 1,000 so she will need to take 2 tablets twice a day.  She needs to make sure she is taking this with food.

## 2020-07-13 ENCOUNTER — Telehealth: Payer: Self-pay | Admitting: Family Medicine

## 2020-07-13 DIAGNOSIS — M5136 Other intervertebral disc degeneration, lumbar region: Secondary | ICD-10-CM

## 2020-07-13 DIAGNOSIS — M5030 Other cervical disc degeneration, unspecified cervical region: Secondary | ICD-10-CM

## 2020-07-13 NOTE — Telephone Encounter (Signed)
Neck injections -- need referral

## 2020-07-13 NOTE — Telephone Encounter (Signed)
I need more information than this.

## 2020-07-14 NOTE — Telephone Encounter (Signed)
Patient states that she has been getting back and neck injections for 15 years due to chronic neck and back pain.  Patient previous had [employer] and was seeing Dr. Governor Rooks- she now has another insurance and will need new referral.

## 2020-07-17 ENCOUNTER — Telehealth: Payer: Self-pay

## 2020-07-17 ENCOUNTER — Ambulatory Visit: Payer: 59 | Admitting: Podiatry

## 2020-07-17 DIAGNOSIS — N393 Stress incontinence (female) (male): Secondary | ICD-10-CM

## 2020-07-17 MED ORDER — MIRABEGRON ER 25 MG PO TB24: 25.0000 mg | ORAL_TABLET | Freq: Every day | ORAL | 2 refills | Status: DC

## 2020-07-17 NOTE — Telephone Encounter (Signed)
  Incoming Patient Call  07/17/2020  What symptoms do you have? When pt walks little by little urine drizzles out and takes a couple more steps and it drizzles again she is having to keep a pull up on.  By the time she makes it to the toilet barely anything comes out   How long have you been sick?She has had this problem since last Wednesday  Have you been seen for this problem? Yes, Karsten Fells has prescribed medication does not remember the name but it is 10mg  pill  but it is not working should she be referred to a specialist?  If your provider decides to give you a prescription, which pharmacy would you like for it to be sent to? [facility]   Patient informed that this information will be sent to the clinical staff for review and that they should receive a follow up call.

## 2020-07-17 NOTE — Telephone Encounter (Signed)
Please make patient aware to stop taking Ditropan.  I sent in a new prescription for her to try to called Myrbetriq, it is 25 mg once daily.

## 2020-07-17 NOTE — Telephone Encounter (Signed)
Patient aware and verbalizes understanding. 

## 2020-07-17 NOTE — Telephone Encounter (Signed)
Patient seen for this and other things on 07/04/20.  She is aware you are out of the office until tomorrow.

## 2020-07-17 NOTE — Telephone Encounter (Signed)
Referral placed.

## 2020-07-18 ENCOUNTER — Telehealth: Payer: Self-pay | Admitting: *Deleted

## 2020-07-18 DIAGNOSIS — N393 Stress incontinence (female) (male): Secondary | ICD-10-CM

## 2020-07-18 NOTE — Telephone Encounter (Signed)
Will renew and re- attempt   (Key: [id number]) Myrbetriq 25MG  er tablets   Sent to plan - CMM fax

## 2020-07-18 NOTE — Telephone Encounter (Signed)
PA attempted for myrbetriq. Patient must have documented trial and failure of 3 contraindication to at least 3 antimuscarinic agents darifenacin, solifenacin, oxybutynin, tolterodine, or trospium

## 2020-07-18 NOTE — Telephone Encounter (Signed)
Upon chart review I found she has tried darifenacin, solifenacin, and oxybutynin. Tolterodine was not covered by insurance.

## 2020-07-21 NOTE — Telephone Encounter (Signed)
PA forms faxed with office notes.

## 2020-07-24 NOTE — Telephone Encounter (Signed)
Fax came in with APPROVAL   Good from 07/21/20-07/20/21.  Mybetriq ER 25 mg   [facility] called and aware

## 2020-07-25 ENCOUNTER — Telehealth: Payer: Self-pay | Admitting: *Deleted

## 2020-07-25 LAB — HM DIABETES EYE EXAM

## 2020-07-25 NOTE — Telephone Encounter (Addendum)
Fax from [facility] Re: Myrbetriq 25 mg ER tablet Note from pharmacy: copay $416.00 Please advise on alternative of choice TC to pharmacy, this is cost with PA

## 2020-07-25 NOTE — Telephone Encounter (Signed)
Please see telephone encounter from 07/18/2020.  It was documented yesterday that Myrbetriq was approved.

## 2020-07-25 NOTE — Telephone Encounter (Signed)
Myrbetriq was approved but it going to cost patient $416 even with prior approval authorization.

## 2020-08-03 ENCOUNTER — Encounter (HOSPITAL_COMMUNITY): Payer: Self-pay

## 2020-08-03 NOTE — Progress Notes (Signed)
LDCT scheduled for 09/05/2020 at 1100.

## 2020-08-05 ENCOUNTER — Other Ambulatory Visit: Payer: Self-pay | Admitting: Family Medicine

## 2020-08-05 DIAGNOSIS — E119 Type 2 diabetes mellitus without complications: Secondary | ICD-10-CM

## 2020-08-05 DIAGNOSIS — E11649 Type 2 diabetes mellitus with hypoglycemia without coma: Secondary | ICD-10-CM

## 2020-08-05 DIAGNOSIS — I10 Essential (primary) hypertension: Secondary | ICD-10-CM

## 2020-08-05 DIAGNOSIS — J302 Other seasonal allergic rhinitis: Secondary | ICD-10-CM

## 2020-08-07 ENCOUNTER — Telehealth: Payer: Self-pay

## 2020-08-08 NOTE — Telephone Encounter (Signed)
Please schedule for appointment to discuss

## 2020-08-08 NOTE — Telephone Encounter (Signed)
Appointment scheduled.

## 2020-08-09 ENCOUNTER — Other Ambulatory Visit: Payer: Self-pay

## 2020-08-09 ENCOUNTER — Encounter: Payer: Self-pay | Admitting: Family Medicine

## 2020-08-09 ENCOUNTER — Ambulatory Visit: Payer: 59 | Admitting: Family Medicine

## 2020-08-09 VITALS — BP 138/65 | HR 94 | Temp 98.3°F | Ht 69.0 in | Wt 175.2 lb

## 2020-08-09 DIAGNOSIS — E1122 Type 2 diabetes mellitus with diabetic chronic kidney disease: Secondary | ICD-10-CM | POA: Diagnosis not present

## 2020-08-09 DIAGNOSIS — R300 Dysuria: Secondary | ICD-10-CM

## 2020-08-09 DIAGNOSIS — N3001 Acute cystitis with hematuria: Secondary | ICD-10-CM

## 2020-08-09 DIAGNOSIS — S61411A Laceration without foreign body of right hand, initial encounter: Secondary | ICD-10-CM

## 2020-08-09 DIAGNOSIS — N183 Chronic kidney disease, stage 3 unspecified: Secondary | ICD-10-CM

## 2020-08-09 LAB — URINALYSIS, COMPLETE
Bilirubin, UA: NEGATIVE
Glucose, UA: NEGATIVE
Ketones, UA: NEGATIVE
Nitrite, UA: NEGATIVE
Protein,UA: NEGATIVE
Specific Gravity, UA: >1.03 — ABNORMAL HIGH (ref 1.005–1.030)
Urobilinogen, Ur: 0.2 mg/dL (ref 0.2–1.0)
pH, UA: 5.5 (ref 5.0–7.5)

## 2020-08-09 LAB — MICROSCOPIC EXAMINATION

## 2020-08-09 MED ORDER — DAPAGLIFLOZIN PROPANEDIOL 10 MG PO TABS: ORAL_TABLET | ORAL | 2 refills | Status: DC

## 2020-08-09 MED ORDER — NITROFURANTOIN MONOHYD MACRO 100 MG PO CAPS: 100.0000 mg | ORAL_CAPSULE | Freq: Two times a day (BID) | ORAL | 0 refills | Status: AC

## 2020-08-09 MED ORDER — FLUCONAZOLE 150 MG PO TABS
150.0000 mg | ORAL_TABLET | Freq: Once | ORAL | 0 refills | Status: AC
Start: 2020-08-09 — End: 2020-08-09
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
## 2020-08-09 NOTE — Patient Instructions (Addendum)
Leave clear dressing in place x7 days.  Stop Metformin. Start Farxiga 5 mg (half tablet) x1 week, then increase to 10 mg (whole tablet).   Urinary Tract Infection, Adult A urinary tract infection (UTI) is an infection of any part of the urinary tract. The urinary tract includes:  The kidneys.  The ureters.  The bladder.  The urethra. These organs make, store, and get rid of pee (urine) in the body. What are the causes? This infection is caused by germs (bacteria) in your genital area. These germs grow and cause swelling (inflammation) of your urinary tract. What increases the risk? The following factors may make you more likely to develop this condition:  Using a small, thin tube (catheter) to drain pee.  Not being able to control when you pee or poop (incontinence).  Being female. If you are female, these things can increase the risk: ? Using these methods to prevent pregnancy:  A medicine that kills sperm (spermicide).  A device that blocks sperm (diaphragm). ? Having low levels of a female hormone (estrogen). ? Being pregnant. You are more likely to develop this condition if:  You have genes that add to your risk.  You are sexually active.  You take antibiotic medicines.  You have trouble peeing because of: ? A prostate that is bigger than normal, if you are female. ? A blockage in the part of your body that drains pee from the bladder. ? A kidney stone. ? A nerve condition that affects your bladder. ? Not getting enough to drink. ? Not peeing often enough.  You have other conditions, such as: ? Diabetes. ? A weak disease-fighting system (immune system). ? Sickle cell disease. ? Gout. ? Injury of the spine. What are the signs or symptoms? Symptoms of this condition include:  Needing to pee right away.  Peeing small amounts often.  Pain or burning when peeing.  Blood in the pee.  Pee that smells bad or not like normal.  Trouble peeing.  Pee that  is cloudy.  Fluid coming from the vagina, if you are female.  Pain in the belly or lower back. Other symptoms include:  Vomiting.  Not feeling hungry.  Feeling mixed up (confused). This may be the first symptom in older adults.  Being tired and grouchy (irritable).  A fever.  Watery poop (diarrhea). How is this treated?  Taking antibiotic medicine.  Taking other medicines.  Drinking enough water. In some cases, you may need to see a specialist. Follow these instructions at home: Medicines  Take over-the-counter and prescription medicines only as told by your doctor.  If you were prescribed an antibiotic medicine, take it as told by your doctor. Do not stop taking it even if you start to feel better. General instructions  Make sure you: ? Pee until your bladder is empty. ? Do not hold pee for a long time. ? Empty your bladder after sex. ? Wipe from front to back after peeing or pooping if you are a female. Use each tissue one time when you wipe.  Drink enough fluid to keep your pee pale yellow.  Keep all follow-up visits.   Contact a doctor if:  You do not get better after 1-2 days.  Your symptoms go away and then come back. Get help right away if:  You have very bad back pain.  You have very bad pain in your lower belly.  You have a fever.  You have chills.  You feeling like you will  vomit or you vomit. Summary  A urinary tract infection (UTI) is an infection of any part of the urinary tract.  This condition is caused by germs in your genital area.  There are many risk factors for a UTI.  Treatment includes antibiotic medicines.  Drink enough fluid to keep your pee pale yellow. This information is not intended to replace advice given to you by your health care provider. Make sure you discuss any questions you have with your health care provider. Document Revised: 01/07/2020 Document Reviewed: 01/07/2020 Elsevier Patient Education  2021 [place].   Skin Tear A skin tear is a wound in which the top layers of skin peel off. This is a common problem for older people. It can also be a problem for people who take certain medicines for too long. To repair the skin, your doctor may use:  Tape.  Skin tape (adhesive) strips. A bandage (dressing) may also be placed over the tape or skin tape strips. Follow these instructions at home: Keep your wound clean  Clean the wound as told by your doctor. You may be told to keep the wound dry for the first few days. If you are told to clean the wound: ? Wash the wound with mild soap and water, a wound cleanser, or a salt-water (saline) solution. ? If you use soap, rinse the wound with water to get all the soap off. ? Do not rub the wound dry. Pat the wound gently with a clean towel or let it air-dry.  Change any bandage as told by your doctor. This includes changing the bandage if it gets wet, gets dirty, or starts to smell bad. To change your bandage: ? Wash your hands with soap and water for at least 20 seconds before and after you change your bandage. If you cannot use soap and water, use hand sanitizer. ? Leave tape or skin tape strips alone unless you are told to take them off. You may trim the edges of the tape strips if they curl up. Watch for signs of infection Check your wound every day for signs of infection. Check for:  Redness, swelling, or pain.  More fluid or blood.  Warmth.  Pus or a bad smell.   Protect your wound  Do not scratch or pick at the wound.  Protect the injured area until it has healed. Medicines  Take or apply over-the-counter and prescription medicines only as told by your doctor.  If you were prescribed an antibiotic medicine, take or apply it as told by your doctor. Do not stop using it even if your condition gets better. General instructions  Keep the bandage dry.  Do not take baths, swim, use a hot tub, or do anything that puts your wound  underwater. Ask your doctor about taking showers or sponge baths.  Keep all follow-up visits.   Contact a doctor if:  You have any of these signs of infection in your wound: ? Redness, swelling, or pain. ? More fluid or blood. ? Warmth. ? Pus or a bad smell. Get help right away if:  You have a red streak that goes away from the skin tear.  You have a fever and chills, and your symptoms get worse all of a sudden. Summary  A skin tear is a wound in which the top layers of skin peel off.  To repair the skin, your doctor may use tape or skin tape strips.  Change any bandage as told by your doctor.  Take or apply over-the-counter and prescription medicines only as told by your doctor.  Contact a doctor if you have signs of infection. This information is not intended to replace advice given to you by your health care provider. Make sure you discuss any questions you have with your health care provider. Document Revised: 09/01/2019 Document Reviewed: 09/01/2019 Elsevier Patient Education  [place].

## 2020-08-12 LAB — URINE CULTURE

## 2020-08-14 ENCOUNTER — Telehealth: Payer: Self-pay

## 2020-08-14 DIAGNOSIS — N3001 Acute cystitis with hematuria: Secondary | ICD-10-CM

## 2020-08-14 DIAGNOSIS — B370 Candidal stomatitis: Secondary | ICD-10-CM

## 2020-08-14 MED ORDER — NYSTATIN 100000 UNIT/ML MT SUSP: 5.0000 mL | Freq: Four times a day (QID) | OROMUCOSAL | 0 refills | Status: AC

## 2020-08-14 MED ORDER — CIPROFLOXACIN HCL 500 MG PO TABS: 500.0000 mg | ORAL_TABLET | Freq: Two times a day (BID) | ORAL | 0 refills | Status: AC

## 2020-08-14 NOTE — Telephone Encounter (Signed)
Her tongue is swollen - is she having a hard time breathing? If yes, she needs to call 911 and get to the ER. How many days did she take the Macrobid total? She was given two doses of Diflucan with instructions to take one if needed and repeat after 3 days if she was still having symptoms.

## 2020-08-14 NOTE — Telephone Encounter (Signed)
Patient aware and verbalized understanding. °

## 2020-08-14 NOTE — Telephone Encounter (Signed)
I sent new antibiotic and magic mouthwash to the pharmacy for her.

## 2020-08-14 NOTE — Telephone Encounter (Signed)
Patient seen on 08/09/20 for cystitis and treated with Macrobid, please advise.

## 2020-08-14 NOTE — Telephone Encounter (Signed)
Patient is not having any difficulty breathing.  She said she took the [place] for 3 days and that is when the symptoms started.  She felt like she was developing yeast on day  2 of the antibiotic and took one of the Diflucan, it had gotten worse the next day so she took the second Diflucan.  She said her mouth is raw and red, it is uncomfortable to eat any thing and her tongue, roof of mouth and throat are hurting pretty badly.  She also reports she still feels she has a UTI, she is experiencing dysuria when urinating.  Her pharmacy is [facility] in [place].

## 2020-08-15 ENCOUNTER — Telehealth: Payer: Self-pay

## 2020-08-15 NOTE — Telephone Encounter (Signed)
Pt aware.

## 2020-08-15 NOTE — Telephone Encounter (Signed)
Spoke with pharmacist at [facility].  They did receive this prescription but because of the patient's insurance she would have a high co-pay for this medication.  She can go online to [employer] and sign up for a discount which would bring the cost down to $25.00 per month.  Can you please call the patient and tell them this, her phone does not allow private callers.

## 2020-08-15 NOTE — Telephone Encounter (Signed)
Patient aware and states she will call back to get instructions instructions

## 2020-08-21 ENCOUNTER — Encounter: Payer: Self-pay | Admitting: Family

## 2020-08-21 ENCOUNTER — Ambulatory Visit (INDEPENDENT_AMBULATORY_CARE_PROVIDER_SITE_OTHER): Payer: 59 | Admitting: Family

## 2020-08-21 DIAGNOSIS — J301 Allergic rhinitis due to pollen: Secondary | ICD-10-CM

## 2020-08-21 MED ORDER — MONTELUKAST SODIUM 10 MG PO TABS
10.0000 mg | ORAL_TABLET | Freq: Every day | ORAL | 3 refills | Status: DC
Start: 2020-08-21 — End: 2020-10-10

## 2020-08-21 NOTE — Progress Notes (Signed)
   Virtual Visit via telephone Note Due to COVID-19 pandemic this visit was conducted virtually. This visit type was conducted due to national recommendations for restrictions regarding the COVID-19 Pandemic (e.g. social distancing, sheltering in place) in an effort to limit this patient's exposure and mitigate transmission in our community. All issues noted in this document were discussed and addressed.  A physical exam was not performed with this format.  I connected with Cynthia Deleon on 08/21/20 at 2:01 pm  by telephone and verified that I am speaking with the correct person using two identifiers. Cynthia Deleon is currently located at home and no one is currently with her during visit. The provider, [name], FNP is located in their office at time of visit.  I discussed the limitations, risks, security and privacy concerns of performing an evaluation and management service by telephone and the availability of in person appointments. I also discussed with the patient that there may be a patient responsible charge related to this service. The patient expressed understanding and agreed to proceed.   History and Present Illness:  Cough This is a new problem. The current episode started in the past 7 days. The problem has been gradually worsening. The problem occurs every few minutes. The cough is non-productive. Associated symptoms include chills, headaches, a sore throat and shortness of breath. Pertinent negatives include no ear congestion, ear pain, fever, nasal congestion, postnasal drip or wheezing. Associated symptoms comments: Fatigue, sneezing . She has tried rest and OTC cough suppressant for the symptoms. The treatment provided mild relief. Her past medical history is significant for COPD.      Review of Systems  Constitutional: Positive for chills. Negative for fever.  HENT: Positive for sore throat. Negative for ear pain and postnasal drip.   Respiratory: Positive for  cough and shortness of breath. Negative for wheezing.   Neurological: Positive for headaches.  All other systems reviewed and are negative.    Observations/Objective: No SOB or distress noted, nasal congestion discussed   Assessment and Plan: 1. Allergic rhinitis due to pollen, unspecified seasonality Avoid allergens when possible  Continue Xyzal and Flonase Report fever, increased cough, or SOB - montelukast (SINGULAIR) 10 MG tablet; Take 1 tablet (10 mg total) by mouth at bedtime.  Dispense: 30 tablet; Refill: 3      I discussed the assessment and treatment plan with the patient. The patient was provided an opportunity to ask questions and all were answered. The patient agreed with the plan and demonstrated an understanding of the instructions.   The patient was advised to call back or seek an in-person evaluation if the symptoms worsen or if the condition fails to improve as anticipated.  The above assessment and management plan was discussed with the patient. The patient verbalized understanding of and has agreed to the management plan. Patient is aware to call the clinic if symptoms persist or worsen. Patient is aware when to return to the clinic for a follow-up visit. Patient educated on when it is appropriate to go to the emergency department.   Time call ended:  2: 15 pm   I provided 14 minutes of non-face-to-face time during this encounter.    [name], FNP

## 2020-08-21 NOTE — Patient Instructions (Signed)
Allergic Rhinitis, Adult  Allergic rhinitis is an allergic reaction that affects the mucous membrane inside the nose. The mucous membrane is the tissue that produces mucus. There are two types of allergic rhinitis:  Seasonal. This type is also called hay fever and happens only during certain seasons.  Perennial. This type can happen at any time of the year. Allergic rhinitis cannot be spread from person to person. This condition can be mild, moderate, or severe. It can develop at any age and may be outgrown. What are the causes? This condition is caused by allergens. These are things that can cause an allergic reaction. Allergens may differ for seasonal allergic rhinitis and perennial allergic rhinitis.  Seasonal allergic rhinitis is triggered by pollen. Pollen can come from grasses, trees, and weeds.  Perennial allergic rhinitis may be triggered by: ? Dust mites. ? Proteins in a pet's urine, saliva, or dander. Dander is dead skin cells from a pet. ? Smoke, mold, or car fumes. What increases the risk? You are more likely to develop this condition if you have a family history of allergies or other conditions related to allergies, including:  Allergic conjunctivitis. This is inflammation of parts of the eyes and eyelids.  Asthma. This condition affects the lungs and makes it hard to breathe.  Atopic dermatitis or eczema. This is long term (chronic) inflammation of the skin.  Food allergies. What are the signs or symptoms? Symptoms of this condition include:  Sneezing or coughing.  A stuffy nose (nasal congestion), itchy nose, or nasal discharge.  Itchy eyes and tearing of the eyes.  A feeling of mucus dripping down the back of your throat (postnasal drip).  Trouble sleeping.  Tiredness or fatigue.  Headache.  Sore throat. How is this diagnosed? This condition may be diagnosed with your symptoms, medical history, and physical exam. Your health care provider may check for  related conditions, such as:  Asthma.  Pink eye. This is eye inflammation caused by infection (conjunctivitis).  Ear infection.  Upper respiratory infection. This is an infection in the nose, throat, or upper airways. You may also have tests to find out which allergens trigger your symptoms. These may include skin tests or blood tests. How is this treated? There is no cure for this condition, but treatment can help control symptoms. Treatment may include:  Taking medicines that block allergy symptoms, such as corticosteroids and antihistamines. Medicine may be given as a shot, nasal spray, or pill.  Avoiding any allergens.  Being exposed again and again to tiny amounts of allergens to help you build a defense against allergens (immunotherapy). This is done if other treatments have not helped. It may include: ? Allergy shots. These are injected medicines that have small amounts of allergen in them. ? Sublingual immunotherapy. This involves taking small doses of a medicine with allergen in it under your tongue. If these treatments do not work, your health care provider may prescribe newer, stronger medicines. Follow these instructions at home: Avoiding allergens Find out what you are allergic to and avoid those allergens. These are some things you can do to help avoid allergens:  If you have perennial allergies: ? Replace carpet with wood, tile, or vinyl flooring. Carpet can trap dander and dust. ? Do not smoke. Do not allow smoking in your home. ? Change your heating and air conditioning filters at least once a month.  If you have seasonal allergies, take these steps during allergy season: ? Keep windows closed as much as possible. ?   Plan outdoor activities when pollen counts are lowest. Check pollen counts before you plan outdoor activities. ? When coming indoors, change clothing and shower before sitting on furniture or bedding.  If you have a pet in the house that produces  allergens: ? Keep the pet out of the bedroom. ? Vacuum, sweep, and dust regularly. General instructions  Take over-the-counter and prescription medicines only as told by your health care provider.  Drink enough fluid to keep your urine pale yellow.  Keep all follow-up visits as told by your health care provider. This is important. Where to find more information  American Academy of Allergy, Asthma & Immunology: www.aaaai.org Contact a health care provider if:  You have a fever.  You develop a cough that does not go away.  You make whistling sounds when you breathe (wheeze).  Your symptoms slow you down or stop you from doing your normal activities each day. Get help right away if:  You have shortness of breath. This symptom may represent a serious problem that is an emergency. Do not wait to see if the symptom will go away. Get medical help right away. Call your local emergency services (911 in the [place] Do not drive yourself to the hospital. Summary  Allergic rhinitis may be managed by taking medicines as directed and avoiding allergens.  If you have seasonal allergies, keep windows closed as much as possible during allergy season.  Contact your health care provider if you develop a fever or a cough that does not go away. This information is not intended to replace advice given to you by your health care provider. Make sure you discuss any questions you have with your health care provider. Document Revised: 07/16/2019 Document Reviewed: 05/25/2019 Elsevier Patient Education  2021 Elsevier Inc.  

## 2020-08-22 ENCOUNTER — Other Ambulatory Visit: Payer: Self-pay

## 2020-08-22 ENCOUNTER — Telehealth: Payer: Self-pay

## 2020-08-22 MED ORDER — DOXYCYCLINE HYCLATE 100 MG PO TABS: 100.0000 mg | ORAL_TABLET | Freq: Two times a day (BID) | ORAL | 0 refills | Status: DC

## 2020-08-22 MED ORDER — FLUCONAZOLE 150 MG PO TABS: 150.0000 mg | ORAL_TABLET | Freq: Once | ORAL | 0 refills | Status: AC

## 2020-08-22 NOTE — Telephone Encounter (Signed)
Patient notified and verbalized understanding. 

## 2020-08-22 NOTE — Telephone Encounter (Signed)
Doxycycline Prescription sent to pharmacy   

## 2020-09-04 ENCOUNTER — Other Ambulatory Visit: Payer: Self-pay | Admitting: Family Medicine

## 2020-09-04 DIAGNOSIS — E11649 Type 2 diabetes mellitus with hypoglycemia without coma: Secondary | ICD-10-CM

## 2020-09-05 ENCOUNTER — Ambulatory Visit (HOSPITAL_COMMUNITY): Admission: RE | Admit: 2020-09-05 | Payer: 59 | Source: Ambulatory Visit

## 2020-09-07 ENCOUNTER — Ambulatory Visit: Payer: BC Managed Care – PPO | Admitting: Family Medicine

## 2020-09-11 ENCOUNTER — Encounter: Payer: Self-pay | Admitting: Family Medicine

## 2020-09-25 ENCOUNTER — Telehealth: Payer: Self-pay

## 2020-09-25 ENCOUNTER — Other Ambulatory Visit: Payer: Self-pay | Admitting: Family Medicine

## 2020-09-25 DIAGNOSIS — L989 Disorder of the skin and subcutaneous tissue, unspecified: Secondary | ICD-10-CM

## 2020-09-25 NOTE — Telephone Encounter (Signed)
Patient is aware to call pharmacy and found out what kind of cream she is speaking of and get them to send over request.

## 2020-09-27 ENCOUNTER — Other Ambulatory Visit: Payer: Self-pay

## 2020-09-27 ENCOUNTER — Ambulatory Visit (INDEPENDENT_AMBULATORY_CARE_PROVIDER_SITE_OTHER): Payer: 59 | Admitting: Nurse Practitioner

## 2020-09-27 ENCOUNTER — Encounter: Payer: Self-pay | Admitting: Nurse Practitioner

## 2020-09-27 ENCOUNTER — Ambulatory Visit: Payer: 59 | Admitting: Family Medicine

## 2020-09-27 VITALS — BP 131/72 | HR 93 | Temp 97.9°F | Ht 69.0 in | Wt 173.0 lb

## 2020-09-27 DIAGNOSIS — T23062A Burn of unspecified degree of back of left hand, initial encounter: Secondary | ICD-10-CM | POA: Diagnosis not present

## 2020-09-27 DIAGNOSIS — T300 Burn of unspecified body region, unspecified degree: Secondary | ICD-10-CM

## 2020-09-27 MED ORDER — HYDROCORTISONE 0.5 % EX CREA: 1.0000 "application " | TOPICAL_CREAM | Freq: Two times a day (BID) | CUTANEOUS | 0 refills | Status: DC

## 2020-09-27 NOTE — Patient Instructions (Signed)
Burn Care, Adult A burn is an injury to the skin or the tissues under the skin. There are three types of burns:  First degree. These burns may cause the skin to be red and a bit swollen.  Second degree. These burns are very painful and cause the skin to be very red. The skin may also swell, leak fluid, look shiny, and start to have blisters.  Third degree. These burns cause lasting damage. They turn the skin white or black and make it look charred, dry, and leathery. Treatment for your burn will depend on the type of burn you have. Taking good care of your burn can help to prevent pain and infection. It can also help the burn heal quickly. How to care for a first-degree burn Right after a burn:  Rinse or soak the burn under cool water for 5 minutes or more. Do not put ice on your burn. That can cause more damage.  Put a cool, clean, wet cloth on your burn.  Put lotion or gel with aloe vera on your burn. Caring for the burn Clean and care for your burn. Your doctor may tell you:  To clean the burn using soap and water.  To pat the burn dry using a clean cloth. Do not rub or scrub the burn.  To put lotion or gel with aloe vera on your burn. How to care for a second-degree burn Right after a burn:  Rinse or soak the burn under cool water. Do this for 5 to 10 minutes. Do not put ice on your burn. This can cause more damage.  Remove any jewelry near the burned area.  Cover the burn with a clean cloth. Caring for the burn  Raise (elevate) the burned area above the level of your heart while sitting or lying down.  Clean and care for your burn. Your doctor may tell you: ? To clean or rinse your burn. ? To put a cream or ointment on the burn. ? To place a germ-free (sterile) dressing over the burn. A dressing is a material that is placed on a burn to help it heal. How to care for a third-degree burn Right after a burn:  Cover the burn with a clean, dry cloth.  Seek treatment  right away if you have this kind of burn. You may: ? Need to stay in the hospital. ? Have surgery to remove burned tissue. ? Have surgery to put new skin on the burned area. ? Be given fluids through an IV tube. Caring for the burn Clean and care for your burn. Your doctor may tell you:  To clean or rinse your burn.  To put a cream or ointment on the burn.  To put a sterile dressing in the burn. This is called packing.  To place a sterile dressing over the burn. Other things to do  Raise the burned area above the level of your heart while sitting or lying down.  Wear splints or immobilizers if told by your doctor.  Rest as told by your doctor. Do not do sports or other activities until your doctor approves. How to prevent infection when caring for a burn  Take these steps to prevent infection: ? Wash your hands with soap and water for at least 20 seconds before and after caring for your burn. If you cannot use soap and water, use hand sanitizer. ? Wear clean or sterile gloves as told by your doctor. ? Do not put butter, oil,  toothpaste, or other home remedies on the burn. ? Do not scratch or pick at the burn. ? Do not break any blisters. ? Do not peel the skin. ? Do not rub your burn, even when you are cleaning it.  Check your burn every day for these signs of infection: ? More redness, swelling, or pain. ? Warmth. ? Pus or a bad smell. ? Red streaks around the burn.   Follow these instructions at home Medicines  Take over-the-counter and prescription medicines only as told by your doctor.  If you were prescribed an antibiotic medicine, use it as told by your doctor. Do not stop using the antibiotic even if your condition gets better.  Your doctor may ask you to take medicine for pain before you change your dressing. General instructions  Protect your burn from the sun.  Drink enough fluid to keep your pee (urine) pale yellow.  Do not use any products that contain  nicotine or tobacco, such as cigarettes, e-cigarettes, and chewing tobacco. These can delay healing. If you need help quitting, ask your doctor.  Keep all follow-up visits as told by your doctor. This is important.   Contact a doctor if:  Your condition does not get better.  Your condition gets worse.  You have a fever or chills.  Your burn feels warm to the touch.  You have more redness, swelling, or pain on your burn.  Your burn looks different or starts to have black or red spots on it.  Your pain does not get better with medicine. Get help right away if:  You have more fluid, blood, or pus coming from your burn.  You have red streaks near the burn.  You have very bad pain. Summary  There are three types of burns. They are first degree, second degree, and third degree. Of these, a third-degree burn is most serious. This must be treated right away.  Treatment for your burn will depend on the type of burn you have.  Do not put butter, oil, toothpaste, or other home remedies on the burn. These things can damage your skin.  Follow instructions from your doctor about how to clean and take care of your burn. This information is not intended to replace advice given to you by your health care provider. Make sure you discuss any questions you have with your health care provider. Document Revised: 07/16/2019 Document Reviewed: 03/16/2019 Elsevier Patient Education  [place].
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
## 2020-10-03 ENCOUNTER — Other Ambulatory Visit: Payer: Self-pay | Admitting: Family

## 2020-10-03 ENCOUNTER — Other Ambulatory Visit: Payer: Self-pay | Admitting: Family Medicine

## 2020-10-03 DIAGNOSIS — G6289 Other specified polyneuropathies: Secondary | ICD-10-CM

## 2020-10-03 DIAGNOSIS — E11649 Type 2 diabetes mellitus with hypoglycemia without coma: Secondary | ICD-10-CM

## 2020-10-03 DIAGNOSIS — I10 Essential (primary) hypertension: Secondary | ICD-10-CM

## 2020-10-03 DIAGNOSIS — J302 Other seasonal allergic rhinitis: Secondary | ICD-10-CM

## 2020-10-03 DIAGNOSIS — E119 Type 2 diabetes mellitus without complications: Secondary | ICD-10-CM

## 2020-10-04 ENCOUNTER — Telehealth: Payer: Self-pay | Admitting: Pharmacist

## 2020-10-04 DIAGNOSIS — G6289 Other specified polyneuropathies: Secondary | ICD-10-CM

## 2020-10-04 MED ORDER — LANSOPRAZOLE 30 MG PO CPDR: 30.0000 mg | DELAYED_RELEASE_CAPSULE | Freq: Every day | ORAL | 12 refills | Status: DC

## 2020-10-04 MED ORDER — OXYBUTYNIN CHLORIDE ER 10 MG PO TB24: 10.0000 mg | ORAL_TABLET | Freq: Every day | ORAL | 1 refills | Status: DC

## 2020-10-04 MED ORDER — PREGABALIN 150 MG PO CAPS: ORAL_CAPSULE | ORAL | 2 refills | Status: DC

## 2020-10-04 NOTE — Telephone Encounter (Signed)
dexilant not covered--switched to lansoprazole myrbetriq too expensive--switched to oxybutynin ER Refill needed of lyrica--sent to PCP

## 2020-10-10 ENCOUNTER — Encounter: Payer: Self-pay | Admitting: Family Medicine

## 2020-10-10 ENCOUNTER — Other Ambulatory Visit: Payer: Self-pay

## 2020-10-10 ENCOUNTER — Ambulatory Visit (INDEPENDENT_AMBULATORY_CARE_PROVIDER_SITE_OTHER): Payer: 59 | Admitting: Family Medicine

## 2020-10-10 VITALS — BP 116/72 | HR 74 | Temp 97.4°F | Ht 69.0 in | Wt 173.2 lb

## 2020-10-10 DIAGNOSIS — R350 Frequency of micturition: Secondary | ICD-10-CM

## 2020-10-10 DIAGNOSIS — E11649 Type 2 diabetes mellitus with hypoglycemia without coma: Secondary | ICD-10-CM | POA: Diagnosis not present

## 2020-10-10 DIAGNOSIS — E1122 Type 2 diabetes mellitus with diabetic chronic kidney disease: Secondary | ICD-10-CM | POA: Diagnosis not present

## 2020-10-10 DIAGNOSIS — Z79899 Other long term (current) drug therapy: Secondary | ICD-10-CM | POA: Insufficient documentation

## 2020-10-10 DIAGNOSIS — N183 Chronic kidney disease, stage 3 unspecified: Secondary | ICD-10-CM

## 2020-10-10 DIAGNOSIS — I10 Essential (primary) hypertension: Secondary | ICD-10-CM

## 2020-10-10 DIAGNOSIS — J302 Other seasonal allergic rhinitis: Secondary | ICD-10-CM | POA: Insufficient documentation

## 2020-10-10 DIAGNOSIS — R42 Dizziness and giddiness: Secondary | ICD-10-CM

## 2020-10-10 DIAGNOSIS — E1142 Type 2 diabetes mellitus with diabetic polyneuropathy: Secondary | ICD-10-CM | POA: Diagnosis not present

## 2020-10-10 LAB — MICROSCOPIC EXAMINATION
Bacteria, UA: NONE SEEN
Epithelial Cells (non renal): NONE SEEN /hpf (ref 0–10)
RBC, Urine: NONE SEEN /hpf (ref 0–2)
WBC, UA: NONE SEEN /hpf (ref 0–5)

## 2020-10-10 LAB — URINALYSIS, ROUTINE W REFLEX MICROSCOPIC
Bilirubin, UA: NEGATIVE
Ketones, UA: NEGATIVE
Leukocytes,UA: NEGATIVE
Nitrite, UA: NEGATIVE
Protein,UA: NEGATIVE
Specific Gravity, UA: 1.01 (ref 1.005–1.030)
Urobilinogen, Ur: 0.2 mg/dL (ref 0.2–1.0)
pH, UA: 7 (ref 5.0–7.5)

## 2020-10-10 LAB — BAYER DCA HB A1C WAIVED: HB A1C (BAYER DCA - WAIVED): 6.4 % (ref <7.0)

## 2020-10-10 MED ORDER — MECLIZINE HCL 25 MG PO TABS: 25.0000 mg | ORAL_TABLET | Freq: Two times a day (BID) | ORAL | 2 refills | Status: DC | PRN

## 2020-10-10 MED ORDER — MONTELUKAST SODIUM 10 MG PO TABS
10.0000 mg | ORAL_TABLET | Freq: Every day | ORAL | 1 refills | Status: DC
Start: 2020-10-10 — End: 2020-12-04

## 2020-10-10 MED ORDER — LISINOPRIL 5 MG PO TABS: ORAL_TABLET | ORAL | 1 refills | Status: DC

## 2020-10-10 MED ORDER — ATORVASTATIN CALCIUM 40 MG PO TABS: 1.0000 | ORAL_TABLET | Freq: Every day | ORAL | 1 refills | Status: DC

## 2020-10-10 MED ORDER — LEVOCETIRIZINE DIHYDROCHLORIDE 5 MG PO TABS
5.0000 mg | ORAL_TABLET | Freq: Every evening | ORAL | 1 refills | Status: DC
Start: 2020-10-10 — End: 2021-03-29

## 2020-10-10 NOTE — Progress Notes (Signed)
Assessment & Plan:  1. Type 2 diabetes mellitus with hypoglycemia without coma, without long-term current use of insulin (HCC) A1c 6.4 today which is improved from 7.4.  - Diabetes is at goal of A1c < 7. - Medications: continue current medications - Home glucose monitoring: continue monitoring - Patient is currently taking a statin. Patient is taking an ACE-inhibitor/ARB.   Diabetes Health Maintenance Due  Topic Date Due  . HEMOGLOBIN A1C  12/07/2020  . FOOT EXAM  03/07/2021  . OPHTHALMOLOGY EXAM  07/25/2021    - Lipid panel - CBC with Differential/Platelet - CMP14+EGFR - Bayer DCA Hb A1c Waived - atorvastatin (LIPITOR) 40 MG tablet; Take 1 tablet (40 mg total) by mouth daily.  Dispense: 90 tablet; Refill: 1 - lisinopril (ZESTRIL) 5 MG tablet; TAKE (1) TABLET BY MOUTH ONCE DAILY.  Dispense: 90 tablet; Refill: 1  2. Diabetes mellitus with stage 3 chronic kidney disease (HCC) On Farxiga. - CMP14+EGFR - Bayer DCA Hb A1c Waived  3. Polyneuropathy due to type 2 diabetes mellitus ([place]) Well controlled on current regimen.  - CMP14+EGFR - ToxASSURE Select 13 (MW), Urine  4. Essential hypertension Well controlled on current regimen.  - Lipid panel - CBC with Differential/Platelet - CMP14+EGFR - lisinopril (ZESTRIL) 5 MG tablet; TAKE (1) TABLET BY MOUTH ONCE DAILY.  Dispense: 90 tablet; Refill: 1  5. Vertigo Rx'd meclizine.  Discussed with patient that I do not feel Phenergan is a good idea with the other medication she is already taking.  Encouraged her to follow-up with Dr. Casimiro Needle as he is the one that is prescribing Valium for vertigo per her report. - meclizine (ANTIVERT) 25 MG tablet; Take 1 tablet (25 mg total) by mouth 2 (two) times daily as needed for dizziness.  Dispense: 30 tablet; Refill: 2  6. Seasonal allergies Well controlled on current regimen.  - levocetirizine (XYZAL) 5 MG tablet; Take 1 tablet (5 mg total) by mouth every evening.  Dispense: 90 tablet; Refill:  1 - montelukast (SINGULAIR) 10 MG tablet; Take 1 tablet (10 mg total) by mouth at bedtime.  Dispense: 90 tablet; Refill: 1  7. Controlled substance agreement signed Controlled substance agreement signed for Lyrica.  Urine drug screen collected today.  PDMP reviewed with no concerning findings.  8. Urinary frequency - Urinalysis, Routine w reflex microscopic   Return in about 3 months (around 01/10/2021) for annual physical.  [name], MSN, APRN, FNP-C [facility]  Subjective:    [name], female    DOB: 1955/07/19, 65 y.o.   MRN: [id number]  Patient Care Team: Loman Brooklyn, FNP as PCP - General (Family Medicine) Suella Broad, MD as Consulting Physician (Physical Medicine and Rehabilitation) Norma Fredrickson, MD as Consulting Physician (Psychiatry) Ilean China, RN as Registered Nurse Phillips Odor, MD as Consulting Physician (Neurology) Lavera Guise, [facility] (Pharmacist) Starlyn Skeans as Physician Assistant (Dermatology)   Chief Complaint:  Chief Complaint  Patient presents with  . Diabetes    Check up   . trouble sleeping    HPI: Cynthia Deleon is a 65 y.o. female presenting on 10/10/2020 for Diabetes (Check up ) and trouble sleeping  Diabetes: Patient presents for follow up of diabetes. Current symptoms include: paresthesia of the feet. Known diabetic complications: peripheral neuropathy. Medication compliance: yes. Current diet: in general, a "healthy" diet  . Current exercise: none. Home blood sugar records: BGs are running  consistent with Hgb A1C. Is she  on ACE  inhibitor or angiotensin II receptor blocker? Yes. Is she on a statin? Yes.    New complaints: Patient reports she is not sleeping well recently.  She is also requesting a prescription for Phenergan due to vertigo.  Wants urine checked due to slight increase in urinary frequency.  Social history:  Relevant past medical, surgical, family  and social history reviewed and updated as indicated. Interim medical history since our last visit reviewed.  Allergies and medications reviewed and updated.  DATA REVIEWED: CHART IN EPIC  ROS: Negative unless specifically indicated above in HPI.    Current Outpatient Medications:  .  ARIPiprazole (ABILIFY) 10 MG tablet, Take 10 mg by mouth daily., Disp: , Rfl:  .  atorvastatin (LIPITOR) 40 MG tablet, TAKE ONE TABLET BY MOUTH ONCE DAILY., Disp: 30 tablet, Rfl: 0 .  BELSOMRA 20 MG TABS, Take 1 tablet by mouth at bedtime as needed., Disp: , Rfl:  .  cyclobenzaprine (FLEXERIL) 10 MG tablet, , Disp: , Rfl:  .  dapagliflozin propanediol (FARXIGA) 10 MG TABS tablet, Take 5 mg (half tablet) by mouth once daily before breakfast x1 week, then increase to 10 mg (whole tablet)., Disp: 30 tablet, Rfl: 2 .  diazepam (VALIUM) 5 MG tablet, Take 5 mg by mouth in the morning and at bedtime. , Disp: , Rfl:  .  fluticasone (FLONASE) 50 MCG/ACT nasal spray, Place 2 sprays into both nostrils daily., Disp: 16 g, Rfl: 5 .  glipiZIDE (GLUCOTROL XL) 5 MG 24 hr tablet, TAKE (1) TABLET BY MOUTH ONCE DAILY WITH EVENING MEAL., Disp: 30 tablet, Rfl: 0 .  glucose blood (CONTOUR NEXT TEST) test strip, Test BS 4 times daily Dx E11.9, Disp: 400 strip, Rfl: 3 .  hydrocortisone cream 0.5 %, Apply 1 application topically 2 (two) times daily., Disp: 30 g, Rfl: 0 .  lamoTRIgine (LAMICTAL) 200 MG tablet, Take 200 mg by mouth at bedtime. For mood disorder., Disp: , Rfl:  .  lansoprazole (PREVACID) 30 MG capsule, Take 1 capsule (30 mg total) by mouth daily at 12 noon., Disp: 30 capsule, Rfl: 12 .  levocetirizine (XYZAL) 5 MG tablet, TAKE ONE TABLET BY MOUTH IN THE EVENING., Disp: 30 tablet, Rfl: 0 .  lisinopril (ZESTRIL) 5 MG tablet, TAKE (1) TABLET BY MOUTH ONCE DAILY., Disp: 30 tablet, Rfl: 0 .  montelukast (SINGULAIR) 10 MG tablet, Take 1 tablet (10 mg total) by mouth at bedtime., Disp: 30 tablet, Rfl: 3 .  mupirocin ointment  (BACTROBAN) 2 %, Apply 1 application topically 2 (two) times daily., Disp: 22 g, Rfl: 1 .  ondansetron (ZOFRAN ODT) 4 MG disintegrating tablet, Take 1 tablet (4 mg total) by mouth every 8 (eight) hours as needed for nausea or vomiting., Disp: 20 tablet, Rfl: 0 .  OneTouch Delica Lancets 24P MISC, Use to test blood sugar twice daily. DX: E11.9, Disp: 100 each, Rfl: 3 .  oxybutynin (DITROPAN-XL) 10 MG 24 hr tablet, Take 1 tablet (10 mg total) by mouth at bedtime., Disp: 30 tablet, Rfl: 1 .  oxyCODONE (ROXICODONE) 15 MG immediate release tablet, Take 7.5 mg by mouth every 6 (six) hours as needed. , Disp: , Rfl:  .  pregabalin (LYRICA) 150 MG capsule, TAKE (1) CAPSULE BY MOUTH TWICE DAILY., Disp: 60 capsule, Rfl: 2 .  PREMARIN 0.3 MG tablet, TAKE (1) TABLET BY MOUTH ONCE DAILY., Disp: 30 tablet, Rfl: 7 .  sitaGLIPtin (JANUVIA) 100 MG tablet, Take 1 tablet (100 mg total) by mouth daily., Disp: 30 tablet, Rfl: 3 .  temazepam (RESTORIL) 15 MG capsule, Take 15 mg by mouth at bedtime., Disp: , Rfl:  .  traZODone (DESYREL) 100 MG tablet, Take 100 mg by mouth at bedtime., Disp: , Rfl:  .  umeclidinium-vilanterol (ANORO ELLIPTA) 62.5-25 MCG/INH AEPB, Inhale 1 puff into the lungs daily., Disp: 30 each, Rfl: 6 .  valACYclovir (VALTREX) 500 MG tablet, Take 1 tablet (500 mg total) by mouth daily as needed (shingles flare up)., Disp: 30 tablet, Rfl: 4   Allergies  Allergen Reactions  . Chantix [Varenicline] Other (See Comments)    Altered mental status-Per patient was put on allergy list by Dr Lorriane Shire bu patient staes she is not allergic to this and is currently taking it.   . Divalproex Sodium Other (See Comments)    Hallucinations  Other reaction(s): Confusion  . Other Anaphylaxis  . Pseudoeph-Hydrocodone-Gg Nausea Only  . Sulfa Antibiotics Anaphylaxis and Nausea Only  . Varenicline Tartrate Other (See Comments)    Other reaction(s): Confusion  . Ambien [Zolpidem] Other (See Comments)    Causes sleep  walking  . Clavulanic Acid Diarrhea  . Codeine Nausea And Vomiting  . Elemental Sulfur Other (See Comments)    Swelling of tongue  . Hydrocodone Nausea And Vomiting  . Macrobid [Nitrofurantoin] Nausea And Vomiting    Dizziness, nausea, vomiting  . Paxil [Paroxetine Hcl] Other (See Comments)    Causes ringing in the ears.   . Amoxicillin Rash    Has patient had a PCN reaction causing immediate rash, facial/tongue/throat swelling, SOB or lightheadedness with hypotension: No Has patient had a PCN reaction causing severe rash involving mucus membranes or skin necrosis: No Has patient had a PCN reaction that required hospitalization: No Has patient had a PCN reaction occurring within the last 10 years: Yes If all of the above answers are "NO", then may proceed with Cephalosporin use.   . Metformin And Related Diarrhea  . Tape Rash   Past Medical History:  Diagnosis Date  . Arthritis    hands and knees  . Bipolar 1 disorder ([place])   . Borderline diabetic   . Chronic back pain   . Chronic neck pain   . Complication of anesthesia    high anxiety-does not want to be alone  . COPD (chronic obstructive pulmonary disease) ([place])   . Current use of estrogen therapy 01/12/2014  . Depression   . Diabetes mellitus without complication ([place])   . Diplopia   . Family history of adverse reaction to anesthesia    sister "gas in lungs"  . GERD (gastroesophageal reflux disease)   . Headache   . Hot flashes 12/15/2013  . Hypertension   . IBS (irritable bowel syndrome)   . Incontinence   . Kidney stone   . 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 Panic attacks   . Peptic ulcer   . Peripheral neuropathy   . Rosacea   . Shingles   . Sleep apnea    uses a cpap-with oxygen    Past Surgical History:  Procedure Laterality Date  . ABDOMINAL HYSTERECTOMY    . BIOPSY  04/27/2020   Procedure: BIOPSY;  Surgeon: Ronald Lobo, MD;  Location: [facility];  Service: Endoscopy;;  . BUNIONECTOMY WITH  HAMMERTOE RECONSTRUCTION Right 12/10/2012   Procedure: RIGHT FIRST METATARSAL CHEVRON BUNION CORRECTION,  2 AND 3 HAMMERTOE CORRECTION , RIGHT 3 AND 4 TOE NAIL EXCISION ;  Surgeon: Wylene Simmer, MD;  Location: [facility];  Service: Orthopedics;  Laterality: Right;  .  COLONOSCOPY WITH PROPOFOL N/A 04/27/2020   Procedure: COLONOSCOPY WITH PROPOFOL;  Surgeon: Ronald Lobo, MD;  Location: [facility];  Service: Endoscopy;  Laterality: N/A;  . FOOT ARTHRODESIS  2000   both feet  . LIPOSUCTION  03/2018  . RECTAL SURGERY    . TONSILLECTOMY      Social History   Socioeconomic History  . Marital status: Divorced    Spouse name: Not on file  . Number of children: 5  . Years of education: 9th  . Highest education level: Not on file  Occupational History  . Occupation: Disabled  Tobacco Use  . Smoking status: Former Smoker    Packs/day: 0.50    Years: 35.00    Pack years: 17.50    Types: Cigarettes    Quit date: 05/11/2019    Years since quitting: 1.4  . Smokeless tobacco: Never Used  Vaping Use  . Vaping Use: Never used  Substance and Sexual Activity  . Alcohol use: No    Alcohol/week: 0.0 standard drinks  . Drug use: No  . Sexual activity: Never    Birth control/protection: Surgical    Comment: occ cigarette  Other Topics Concern  . Not on file  Social History Narrative   Lives at home with home with her son (has five children).   Right-handed.   1 cup caffeine per day.   Social Determinants of Health   Financial Resource Strain: Not on file  Food Insecurity: No Food Insecurity  . Worried About Charity fundraiser in the Last Year: Never true  . Ran Out of Food in the Last Year: Never true  Transportation Needs: No Transportation Needs  . Lack of Transportation (Medical): No  . Lack of Transportation (Non-Medical): No  Physical Activity: Not on file  Stress: Not on file  Social Connections: Not on file  Intimate Partner Violence: Not on file         Objective:    BP 116/72   Pulse 74   Temp (!) 97.4 F (36.3 C) (Temporal)   Ht _0  (1.753 m)   Wt 173 lb 3.2 oz (78.6 kg)   SpO2 94%   BMI 25.58 kg/m   Wt Readings from Last 3 Encounters:  10/10/20 173 lb 3.2 oz (78.6 kg)  09/27/20 173 lb (78.5 kg)  08/09/20 175 lb 3.2 oz (79.5 kg)    Physical Exam Vitals reviewed.  Constitutional:      General: She is not in acute distress.    Appearance: Normal appearance. She is normal weight. She is not ill-appearing, toxic-appearing or diaphoretic.  HENT:     Head: Normocephalic and atraumatic.  Eyes:     General: No scleral icterus.       Right eye: No discharge.        Left eye: No discharge.     Conjunctiva/sclera: Conjunctivae normal.  Cardiovascular:     Rate and Rhythm: Normal rate and regular rhythm.     Heart sounds: Normal heart sounds. No murmur heard. No friction rub. No gallop.   Pulmonary:     Effort: Pulmonary effort is normal. No respiratory distress.     Breath sounds: Normal breath sounds. No stridor. No wheezing, rhonchi or rales.  Musculoskeletal:        General: Normal range of motion.     Cervical back: Normal range of motion.  Skin:    General: Skin is warm and dry.     Capillary Refill: Capillary refill takes less than  2 seconds.  Neurological:     General: No focal deficit present.     Mental Status: She is alert and oriented to person, place, and time. Mental status is at baseline.  Psychiatric:        Mood and Affect: Mood normal.        Behavior: Behavior normal.        Thought Content: Thought content normal.        Judgment: Judgment normal.     Lab Results  Component Value Date   TSH 1.220 07/04/2020   Lab Results  Component Value Date   WBC 11.0 (H) 06/08/2020   HGB 15.8 06/08/2020   HCT 45.7 06/08/2020   MCV 91 06/08/2020   PLT 201 06/08/2020   Lab Results  Component Value Date   NA 141 06/08/2020   K 4.9 06/08/2020   CO2 24 06/08/2020   GLUCOSE 187 (H) 06/08/2020   BUN  15 06/08/2020   CREATININE 0.72 06/08/2020   BILITOT 0.4 03/07/2020   ALKPHOS 68 03/07/2020   AST 11 03/07/2020   ALT 7 03/07/2020   PROT 6.3 03/07/2020   ALBUMIN 4.6 03/07/2020   CALCIUM 9.7 06/08/2020   ANIONGAP 8 04/22/2018   Lab Results  Component Value Date   CHOL 155 06/08/2020   Lab Results  Component Value Date   HDL 45 06/08/2020   Lab Results  Component Value Date   LDLCALC 84 06/08/2020   Lab Results  Component Value Date   TRIG 146 06/08/2020   Lab Results  Component Value Date   CHOLHDL 3.4 06/08/2020   Lab Results  Component Value Date   HGBA1C 7.4 (H) 06/08/2020

## 2020-10-11 ENCOUNTER — Encounter: Payer: Self-pay | Admitting: Family Medicine

## 2020-10-11 LAB — CMP14+EGFR
ALT: 13 IU/L (ref 0–32)
AST: 18 IU/L (ref 0–40)
Albumin/Globulin Ratio: 3.2 — ABNORMAL HIGH (ref 1.2–2.2)
Albumin: 5.1 g/dL — ABNORMAL HIGH (ref 3.8–4.8)
Alkaline Phosphatase: 55 IU/L (ref 44–121)
BUN/Creatinine Ratio: 19 (ref 12–28)
BUN: 16 mg/dL (ref 8–27)
Bilirubin Total: 0.7 mg/dL (ref 0.0–1.2)
CO2: 25 mmol/L (ref 20–29)
Calcium: 10.1 mg/dL (ref 8.7–10.3)
Chloride: 101 mmol/L (ref 96–106)
Creatinine, Ser: 0.84 mg/dL (ref 0.57–1.00)
Globulin, Total: 1.6 g/dL (ref 1.5–4.5)
Glucose: 86 mg/dL (ref 65–99)
Potassium: 4.8 mmol/L (ref 3.5–5.2)
Sodium: 142 mmol/L (ref 134–144)
Total Protein: 6.7 g/dL (ref 6.0–8.5)
eGFR: 78 mL/min/{1.73_m2} (ref >59)

## 2020-10-11 LAB — CBC WITH DIFFERENTIAL/PLATELET
Basophils Absolute: 0.1 10*3/uL (ref 0.0–0.2)
Basos: 2 %
EOS (ABSOLUTE): 0.1 10*3/uL (ref 0.0–0.4)
Eos: 1 %
Hematocrit: 44.5 % (ref 34.0–46.6)
Hemoglobin: 15.5 g/dL (ref 11.1–15.9)
Immature Grans (Abs): 0 10*3/uL (ref 0.0–0.1)
Immature Granulocytes: 0 %
Lymphocytes Absolute: 2.3 10*3/uL (ref 0.7–3.1)
Lymphs: 35 %
MCH: 31.2 pg (ref 26.6–33.0)
MCHC: 34.8 g/dL (ref 31.5–35.7)
MCV: 90 fL (ref 79–97)
Monocytes Absolute: 0.5 10*3/uL (ref 0.1–0.9)
Monocytes: 8 %
Neutrophils Absolute: 3.5 10*3/uL (ref 1.4–7.0)
Neutrophils: 54 %
Platelets: 267 10*3/uL (ref 150–450)
RBC: 4.97 x10E6/uL (ref 3.77–5.28)
RDW: 12.3 % (ref 11.7–15.4)
WBC: 6.5 10*3/uL (ref 3.4–10.8)

## 2020-10-11 LAB — LIPID PANEL
Chol/HDL Ratio: 2.9 ratio (ref 0.0–4.4)
Cholesterol, Total: 116 mg/dL (ref 100–199)
HDL: 40 mg/dL (ref >39)
LDL Chol Calc (NIH): 61 mg/dL (ref 0–99)
Triglycerides: 72 mg/dL (ref 0–149)
VLDL Cholesterol Cal: 15 mg/dL (ref 5–40)

## 2020-10-18 LAB — TOXASSURE SELECT 13 (MW), URINE

## 2020-10-25 ENCOUNTER — Other Ambulatory Visit: Payer: Self-pay

## 2020-10-25 ENCOUNTER — Encounter: Payer: Self-pay | Admitting: Family Medicine

## 2020-10-25 ENCOUNTER — Ambulatory Visit (INDEPENDENT_AMBULATORY_CARE_PROVIDER_SITE_OTHER): Payer: 59 | Admitting: Family Medicine

## 2020-10-25 VITALS — BP 118/73 | HR 89 | Temp 98.0°F | Ht 69.0 in | Wt 177.6 lb

## 2020-10-25 DIAGNOSIS — L989 Disorder of the skin and subcutaneous tissue, unspecified: Secondary | ICD-10-CM

## 2020-10-25 NOTE — Progress Notes (Signed)
Assessment & Plan:  1. Bumps on skin Cyst? Encouraged her to continue cleansing skin daily. No need to apply Neosporin or alcohol. Referred to dermatology.  - Ambulatory referral to Dermatology   Follow up plan: Return if symptoms worsen or fail to improve.  [name], MSN, APRN, FNP-C [facility]  Subjective:   [name], female    DOB: August 31, 1955, 65 y.o.   MRN: [id number]  HPI: Cynthia Deleon is a 65 y.o. female presenting on 10/25/2020 for knot on right cheek  (Patient states it has been there x 1 month and has gotten bigger.  Started to become painful x 1 week)  Patient has a bump on her right cheek that has been present x1 month. Initially it did not bother her, for the past week it has become painful. She has been cleansing her face with dove soap and applying Neosporin and alcohol.   ROS: Negative unless specifically indicated above in HPI.   Relevant past medical history reviewed and updated as indicated.   Allergies and medications reviewed and updated.   Current Outpatient Medications:  .  ARIPiprazole (ABILIFY) 10 MG tablet, Take 10 mg by mouth daily., Disp: , Rfl:  .  atorvastatin (LIPITOR) 40 MG tablet, Take 1 tablet (40 mg total) by mouth daily., Disp: 90 tablet, Rfl: 1 .  BELSOMRA 20 MG TABS, Take 1 tablet by mouth at bedtime as needed., Disp: , Rfl:  .  cyclobenzaprine (FLEXERIL) 10 MG tablet, , Disp: , Rfl:  .  dapagliflozin propanediol (FARXIGA) 10 MG TABS tablet, Take 5 mg (half tablet) by mouth once daily before breakfast x1 week, then increase to 10 mg (whole tablet)., Disp: 30 tablet, Rfl: 2 .  diazepam (VALIUM) 5 MG tablet, Take 5 mg by mouth in the morning and at bedtime. , Disp: , Rfl:  .  fluticasone (FLONASE) 50 MCG/ACT nasal spray, Place 2 sprays into both nostrils daily., Disp: 16 g, Rfl: 5 .  glipiZIDE (GLUCOTROL XL) 5 MG 24 hr tablet, TAKE (1) TABLET BY MOUTH ONCE DAILY WITH EVENING MEAL., Disp: 30  tablet, Rfl: 0 .  glucose blood (CONTOUR NEXT TEST) test strip, Test BS 4 times daily Dx E11.9, Disp: 400 strip, Rfl: 3 .  hydrocortisone cream 0.5 %, Apply 1 application topically 2 (two) times daily., Disp: 30 g, Rfl: 0 .  lamoTRIgine (LAMICTAL) 200 MG tablet, Take 200 mg by mouth at bedtime. For mood disorder., Disp: , Rfl:  .  lansoprazole (PREVACID) 30 MG capsule, Take 1 capsule (30 mg total) by mouth daily at 12 noon., Disp: 30 capsule, Rfl: 12 .  levocetirizine (XYZAL) 5 MG tablet, Take 1 tablet (5 mg total) by mouth every evening., Disp: 90 tablet, Rfl: 1 .  lisinopril (ZESTRIL) 5 MG tablet, TAKE (1) TABLET BY MOUTH ONCE DAILY., Disp: 90 tablet, Rfl: 1 .  meclizine (ANTIVERT) 25 MG tablet, Take 1 tablet (25 mg total) by mouth 2 (two) times daily as needed for dizziness., Disp: 30 tablet, Rfl: 2 .  montelukast (SINGULAIR) 10 MG tablet, Take 1 tablet (10 mg total) by mouth at bedtime., Disp: 90 tablet, Rfl: 1 .  mupirocin ointment (BACTROBAN) 2 %, Apply 1 application topically 2 (two) times daily., Disp: 22 g, Rfl: 1 .  ondansetron (ZOFRAN ODT) 4 MG disintegrating tablet, Take 1 tablet (4 mg total) by mouth every 8 (eight) hours as needed for nausea or vomiting., Disp: 20 tablet, Rfl: 0 .  OneTouch Delica Lancets  30G MISC, Use to test blood sugar twice daily. DX: E11.9, Disp: 100 each, Rfl: 3 .  oxybutynin (DITROPAN-XL) 10 MG 24 hr tablet, Take 1 tablet (10 mg total) by mouth at bedtime., Disp: 30 tablet, Rfl: 1 .  oxyCODONE (ROXICODONE) 15 MG immediate release tablet, Take 7.5 mg by mouth every 6 (six) hours as needed. , Disp: , Rfl:  .  pregabalin (LYRICA) 150 MG capsule, TAKE (1) CAPSULE BY MOUTH TWICE DAILY., Disp: 60 capsule, Rfl: 2 .  PREMARIN 0.3 MG tablet, TAKE (1) TABLET BY MOUTH ONCE DAILY., Disp: 30 tablet, Rfl: 7 .  sitaGLIPtin (JANUVIA) 100 MG tablet, Take 1 tablet (100 mg total) by mouth daily., Disp: 30 tablet, Rfl: 3 .  temazepam (RESTORIL) 15 MG capsule, Take 15 mg by mouth at  bedtime., Disp: , Rfl:  .  traZODone (DESYREL) 100 MG tablet, Take 100 mg by mouth at bedtime., Disp: , Rfl:  .  umeclidinium-vilanterol (ANORO ELLIPTA) 62.5-25 MCG/INH AEPB, Inhale 1 puff into the lungs daily., Disp: 30 each, Rfl: 6 .  valACYclovir (VALTREX) 500 MG tablet, Take 1 tablet (500 mg total) by mouth daily as needed (shingles flare up)., Disp: 30 tablet, Rfl: 4  Allergies  Allergen Reactions  . Chantix [Varenicline] Other (See Comments)    Altered mental status-Per patient was put on allergy list by Dr Lorriane Shire bu patient staes she is not allergic to this and is currently taking it.   . Divalproex Sodium Other (See Comments)    Hallucinations  Other reaction(s): Confusion  . Other Anaphylaxis  . Pseudoeph-Hydrocodone-Gg Nausea Only  . Sulfa Antibiotics Anaphylaxis and Nausea Only  . Varenicline Tartrate Other (See Comments)    Other reaction(s): Confusion  . Ambien [Zolpidem] Other (See Comments)    Causes sleep walking  . Clavulanic Acid Diarrhea  . Codeine Nausea And Vomiting  . Elemental Sulfur Other (See Comments)    Swelling of tongue  . Hydrocodone Nausea And Vomiting  . Macrobid [Nitrofurantoin] Nausea And Vomiting    Dizziness, nausea, vomiting  . Paxil [Paroxetine Hcl] Other (See Comments)    Causes ringing in the ears.   . Amoxicillin Rash    Has patient had a PCN reaction causing immediate rash, facial/tongue/throat swelling, SOB or lightheadedness with hypotension: No Has patient had a PCN reaction causing severe rash involving mucus membranes or skin necrosis: No Has patient had a PCN reaction that required hospitalization: No Has patient had a PCN reaction occurring within the last 10 years: Yes If all of the above answers are "NO", then may proceed with Cephalosporin use.   . Metformin And Related Diarrhea  . Tape Rash    Objective:   BP 118/73   Pulse 89   Temp 98 F (36.7 C) (Temporal)   Ht 5\' 9"  (1.753 m)   Wt 177 lb 9.6 oz (80.6 kg)   SpO2  92%   BMI 26.23 kg/m    Physical Exam Vitals reviewed.  Constitutional:      General: She is not in acute distress.    Appearance: Normal appearance. She is not ill-appearing, toxic-appearing or diaphoretic.  HENT:     Head: Normocephalic and atraumatic.  Eyes:     General: No scleral icterus.       Right eye: No discharge.        Left eye: No discharge.     Conjunctiva/sclera: Conjunctivae normal.  Cardiovascular:     Rate and Rhythm: Normal rate.  Pulmonary:  Effort: Pulmonary effort is normal. No respiratory distress.  Musculoskeletal:        General: Normal range of motion.     Cervical back: Normal range of motion.  Skin:    General: Skin is warm and dry.     Capillary Refill: Capillary refill takes less than 2 seconds.     Comments: Bump to right cheek that is the same color as her skin. No surrounding erythema, warmth, or drainage, It does not have a head.   Neurological:     General: No focal deficit present.     Mental Status: She is alert and oriented to person, place, and time. Mental status is at baseline.  Psychiatric:        Mood and Affect: Mood normal.        Behavior: Behavior normal.        Thought Content: Thought content normal.        Judgment: Judgment normal.

## 2020-10-27 ENCOUNTER — Telehealth: Payer: Self-pay | Admitting: Family Medicine

## 2020-10-27 NOTE — Telephone Encounter (Signed)
Needs appointment

## 2020-10-27 NOTE — Telephone Encounter (Signed)
Patient has been seen recent - does she need another appointment or can something be sent in ?

## 2020-10-30 ENCOUNTER — Encounter: Payer: Self-pay | Admitting: Family Medicine

## 2020-10-30 NOTE — Telephone Encounter (Signed)
Patient aware and will call back to schedule if needed.

## 2020-10-31 ENCOUNTER — Ambulatory Visit (HOSPITAL_COMMUNITY): Payer: 59

## 2020-11-01 ENCOUNTER — Other Ambulatory Visit: Payer: Self-pay | Admitting: Family Medicine

## 2020-11-01 DIAGNOSIS — N183 Chronic kidney disease, stage 3 unspecified: Secondary | ICD-10-CM

## 2020-11-01 DIAGNOSIS — E1122 Type 2 diabetes mellitus with diabetic chronic kidney disease: Secondary | ICD-10-CM

## 2020-11-07 ENCOUNTER — Encounter (HOSPITAL_COMMUNITY): Payer: Self-pay

## 2020-11-07 NOTE — Progress Notes (Signed)
Attempted to reach patient regarding LDCT. Unable to reach patient at this time. Detailed VM left asking that the patient return my call to schedule.

## 2020-11-15 ENCOUNTER — Ambulatory Visit (INDEPENDENT_AMBULATORY_CARE_PROVIDER_SITE_OTHER): Payer: 59 | Admitting: Family Medicine

## 2020-11-15 ENCOUNTER — Encounter: Payer: Self-pay | Admitting: Family Medicine

## 2020-11-15 DIAGNOSIS — H66009 Acute suppurative otitis media without spontaneous rupture of ear drum, unspecified ear: Secondary | ICD-10-CM | POA: Diagnosis not present

## 2020-11-15 DIAGNOSIS — J0100 Acute maxillary sinusitis, unspecified: Secondary | ICD-10-CM

## 2020-11-15 MED ORDER — FLUCONAZOLE 150 MG PO TABS: 150.0000 mg | ORAL_TABLET | Freq: Once | ORAL | 0 refills | Status: AC

## 2020-11-15 MED ORDER — CEFPROZIL 500 MG PO TABS: 500.0000 mg | ORAL_TABLET | Freq: Two times a day (BID) | ORAL | 0 refills | Status: DC

## 2020-11-15 NOTE — Progress Notes (Signed)
Subjective:    [name], female    DOB: 1956-03-14, 65 y.o.   MRN: [id number]   HPI: Cynthia Deleon is a 65 y.o. female presenting for sore throat on the left. Left sided earache. Congested 10 days. Covid test 2 days ago was negative. ST &earache onset last night. Denies fever. A little dyspneic, but not limiting her. Walking outside as normal, felt winded. No cough. YEsterday onset of Frontal HA.    Depression screen [facility] 2/9 10/25/2020 10/10/2020 09/27/2020 07/04/2020 06/20/2020  Decreased Interest 2 0 0 2 3  Down, Depressed, Hopeless 2 0 0 2 2  PHQ - 2 Score 4 0 0 4 5  Altered sleeping 3 - 0 3 3  Tired, decreased energy 0 - 0 3 3  Change in appetite 2 - 0 3 0  Feeling bad or failure about yourself  2 - 0 2 1  Trouble concentrating 3 - 0 3 2  Moving slowly or fidgety/restless 0 - 0 0 0  Suicidal thoughts 0 - - 0 0  PHQ-9 Score 14 - 0 18 14  Difficult doing work/chores Not difficult at all - - Not difficult at all Somewhat difficult  Some recent data might be hidden     Relevant past medical, surgical, family and social history reviewed and updated as indicated.  Interim medical history since our last visit reviewed. Allergies and medications reviewed and updated.  ROS:  Review of Systems   Social History   Tobacco Use  Smoking Status Former Smoker  . Packs/day: 0.50  . Years: 35.00  . Pack years: 17.50  . Types: Cigarettes  . Quit date: 05/11/2019  . Years since quitting: 1.5  Smokeless Tobacco Never Used       Objective:     Wt Readings from Last 3 Encounters:  10/25/20 177 lb 9.6 oz (80.6 kg)  10/10/20 173 lb 3.2 oz (78.6 kg)  09/27/20 173 lb (78.5 kg)     Exam deferred. Pt. Harboring due to COVID 19. Phone visit performed.   Assessment & Plan:   1. Acute suppurative otitis media without spontaneous rupture of ear drum, recurrence not specified, unspecified laterality   2. Acute maxillary sinusitis, recurrence not specified      Meds ordered this encounter  Medications  . fluconazole (DIFLUCAN) 150 MG tablet    Sig: Take 1 tablet (150 mg total) by mouth once for 1 dose. At onset of symptoms. Repeat at end of treatment    Dispense:  2 tablet    Refill:  0  . cefPROZIL (CEFZIL) 500 MG tablet    Sig: Take 1 tablet (500 mg total) by mouth 2 (two) times daily. For infection. Take all of this medication.    Dispense:  20 tablet    Refill:  0    No orders of the defined types were placed in this encounter.     Diagnoses and all orders for this visit:  Acute suppurative otitis media without spontaneous rupture of ear drum, recurrence not specified, unspecified laterality  Acute maxillary sinusitis, recurrence not specified  Other orders -     fluconazole (DIFLUCAN) 150 MG tablet; Take 1 tablet (150 mg total) by mouth once for 1 dose. At onset of symptoms. Repeat at end of treatment -     cefPROZIL (CEFZIL) 500 MG tablet; Take 1 tablet (500 mg total) by mouth 2 (two) times daily. For infection. Take all of this medication.    Virtual  Visit via telephone Note  I discussed the limitations, risks, security and privacy concerns of performing an evaluation and management service by telephone and the availability of in person appointments. The patient was identified with two identifiers. Pt.expressed understanding and agreed to proceed. Pt. Is at home. Dr. Livia Snellen is in his office.  Follow Up Instructions:   I discussed the assessment and treatment plan with the patient. The patient was provided an opportunity to ask questions and all were answered. The patient agreed with the plan and demonstrated an understanding of the instructions.   The patient was advised to call back or seek an in-person evaluation if the symptoms worsen or if the condition fails to improve as anticipated.   Total minutes including chart review and phone contact time: 9   Follow up plan: Return if symptoms worsen or fail to  improve.  Cynthia Fraise, MD [place]

## 2020-11-24 ENCOUNTER — Ambulatory Visit: Payer: 59 | Admitting: Nurse Practitioner

## 2020-11-28 IMAGING — MG DIGITAL SCREENING BILAT W/ TOMO W/ CAD
8 series · 8 of 24 positions shown · non-contrast
Comparison: Previous exam(s).

CLINICAL DATA: Screening.

EXAM:
DIGITAL SCREENING BILATERAL MAMMOGRAM WITH TOMO AND CAD

[R CC synth-2D]
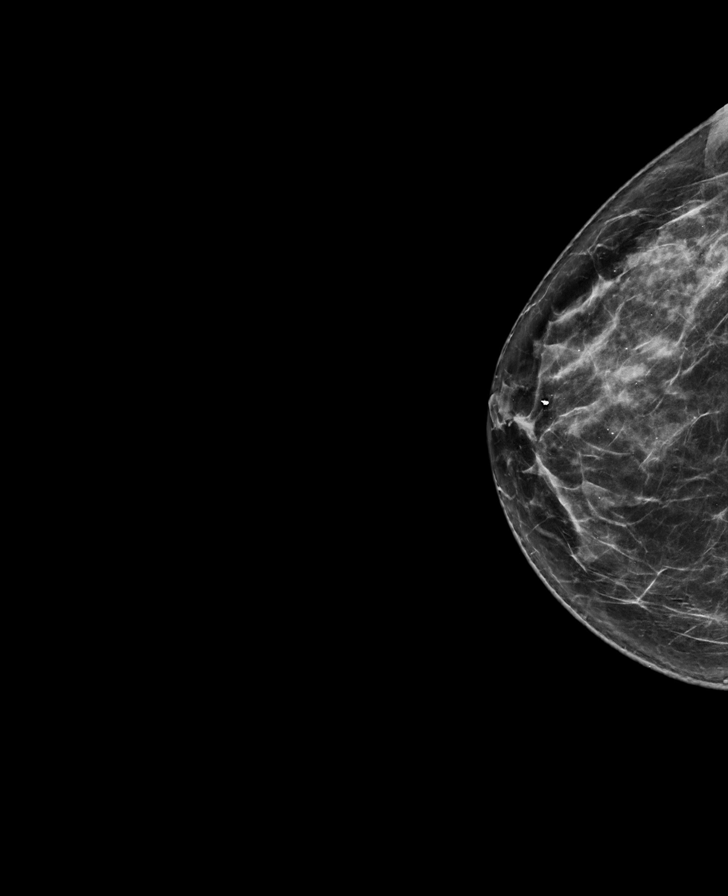

[L MLO synth-2D]
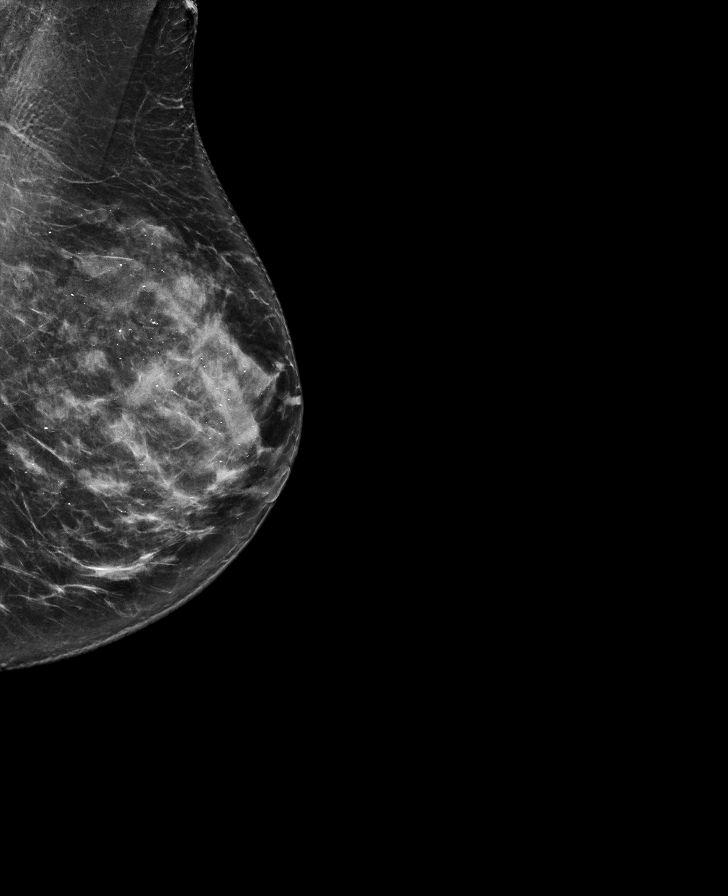

[R MLO synth-2D]
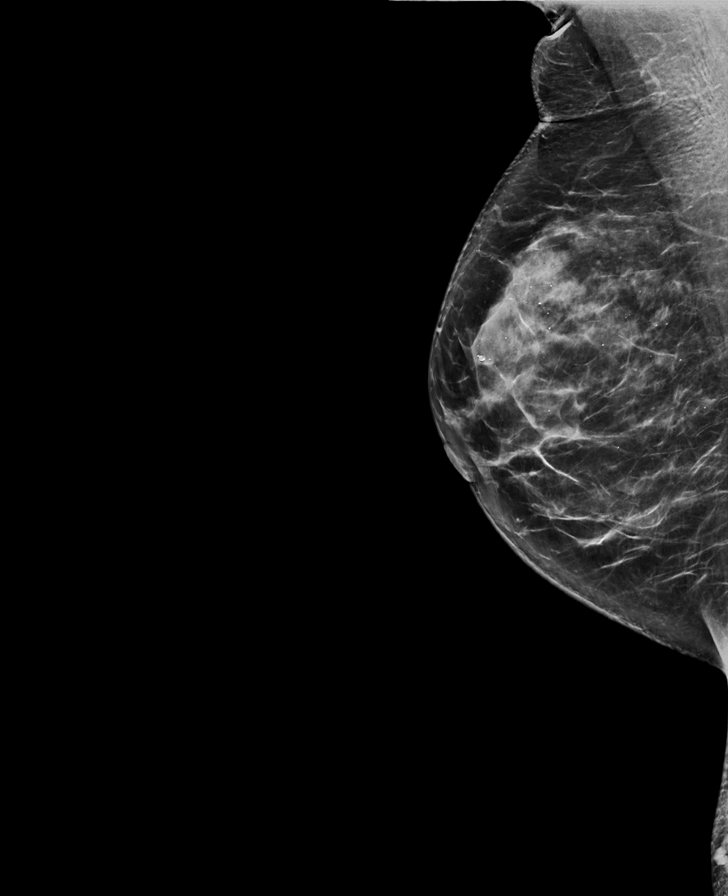

[L CC synth-2D]
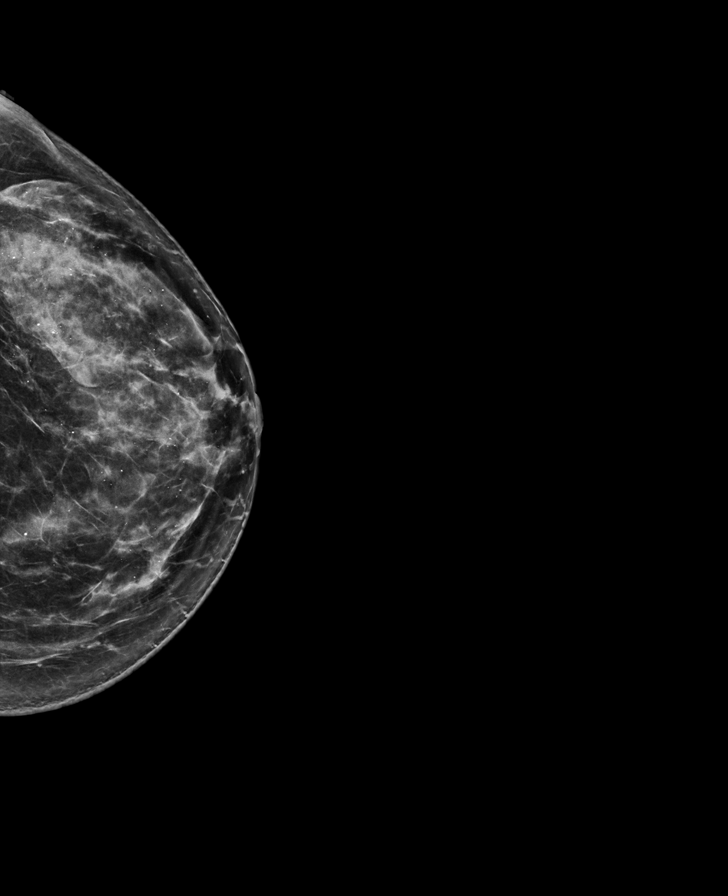

[L MLO tomo · tomo slice 39/78.0]
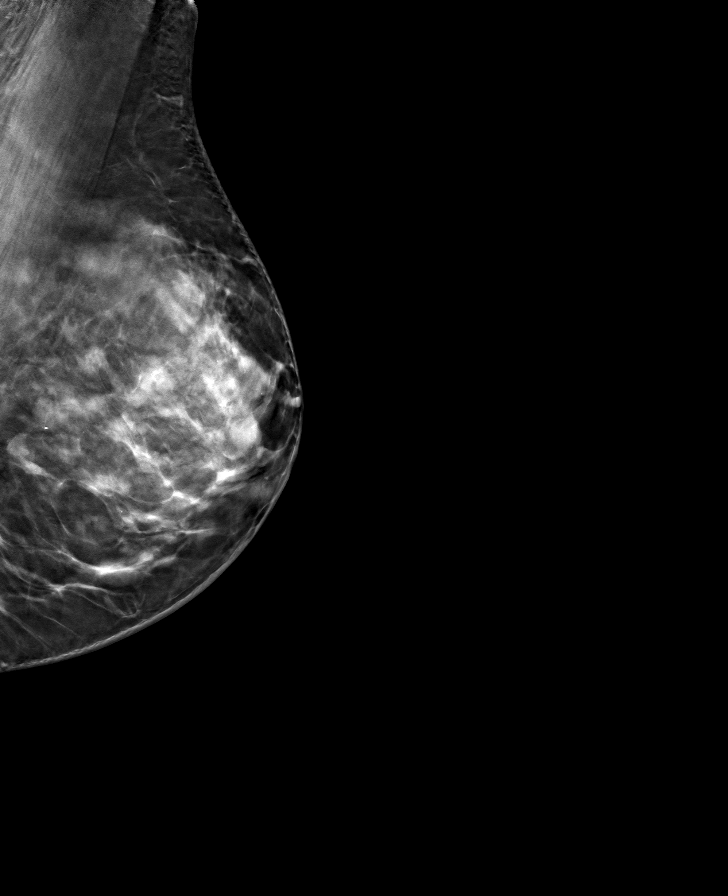

[R CC tomo · tomo slice 39/76.0]
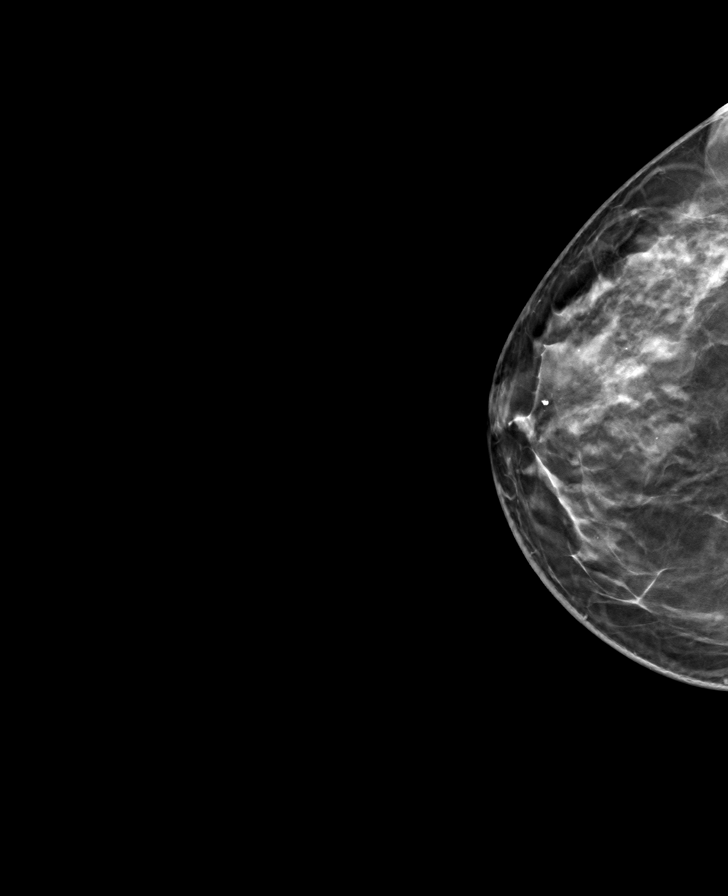

[L CC tomo · tomo slice 38/75.0]
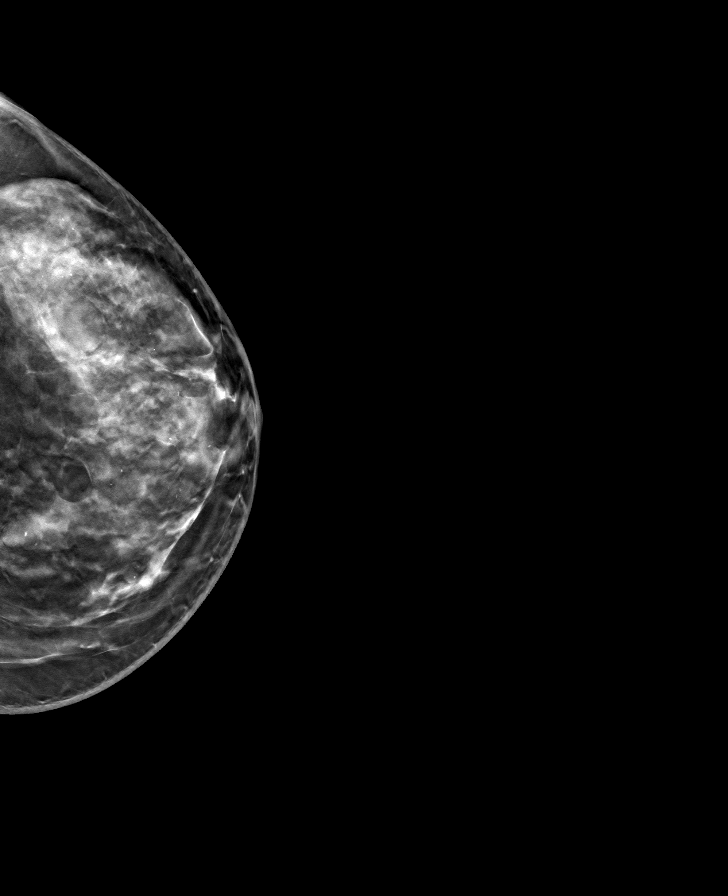

[R MLO tomo · tomo slice 39/78.0]
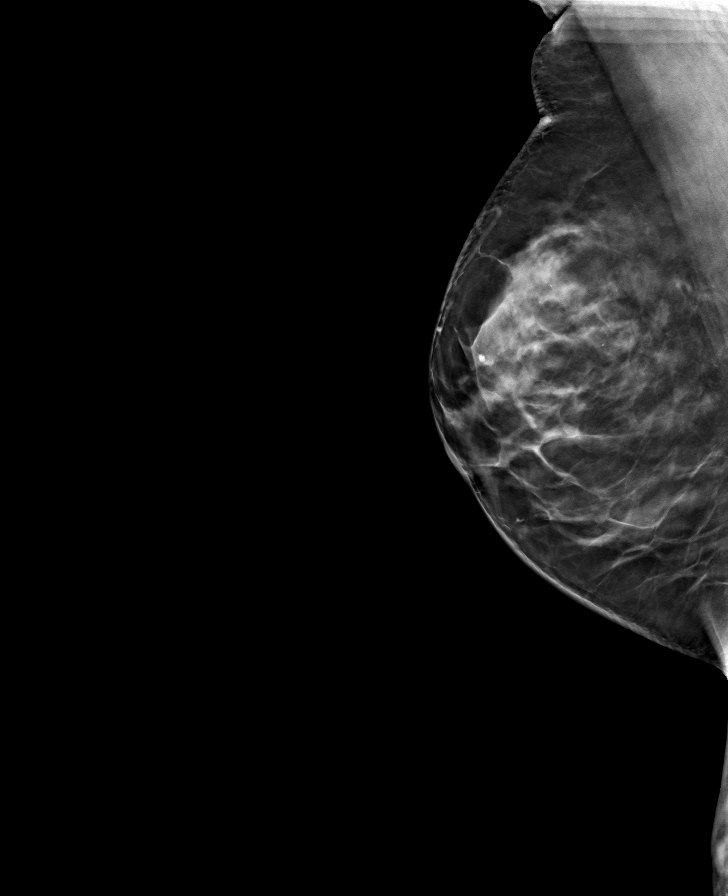

[8 of 24 positions shown; findings below may reference images not displayed]

ACR Breast Density Category c: The breast tissue is heterogeneously
dense, which may obscure small masses.
FINDINGS: There are no findings suspicious for malignancy. Images were
processed with CAD.
IMPRESSION: No mammographic evidence of malignancy. A result letter of this
screening mammogram will be mailed directly to the patient.

RECOMMENDATION:
Screening mammogram in one year. (Code:[id number])

BI-RADS CATEGORY  1: Negative.

## 2020-12-04 ENCOUNTER — Other Ambulatory Visit: Payer: Self-pay | Admitting: Family Medicine

## 2020-12-04 DIAGNOSIS — G6289 Other specified polyneuropathies: Secondary | ICD-10-CM

## 2020-12-04 DIAGNOSIS — E1122 Type 2 diabetes mellitus with diabetic chronic kidney disease: Secondary | ICD-10-CM

## 2020-12-04 DIAGNOSIS — N183 Chronic kidney disease, stage 3 unspecified: Secondary | ICD-10-CM

## 2020-12-04 DIAGNOSIS — J302 Other seasonal allergic rhinitis: Secondary | ICD-10-CM

## 2020-12-12 DIAGNOSIS — Z5181 Encounter for therapeutic drug level monitoring: Secondary | ICD-10-CM | POA: Diagnosis not present

## 2020-12-12 DIAGNOSIS — Z79899 Other long term (current) drug therapy: Secondary | ICD-10-CM | POA: Diagnosis not present

## 2020-12-12 DIAGNOSIS — R52 Pain, unspecified: Secondary | ICD-10-CM | POA: Diagnosis not present

## 2020-12-12 DIAGNOSIS — G894 Chronic pain syndrome: Secondary | ICD-10-CM | POA: Diagnosis not present

## 2020-12-18 ENCOUNTER — Encounter: Payer: Self-pay | Admitting: Family Medicine

## 2020-12-18 ENCOUNTER — Other Ambulatory Visit: Payer: Self-pay

## 2020-12-18 ENCOUNTER — Ambulatory Visit (INDEPENDENT_AMBULATORY_CARE_PROVIDER_SITE_OTHER): Payer: Medicare Other | Admitting: Family Medicine

## 2020-12-18 VITALS — BP 126/69 | HR 78 | Temp 97.4°F | Ht 69.0 in | Wt 176.0 lb

## 2020-12-18 DIAGNOSIS — N3001 Acute cystitis with hematuria: Secondary | ICD-10-CM

## 2020-12-18 DIAGNOSIS — R300 Dysuria: Secondary | ICD-10-CM | POA: Diagnosis not present

## 2020-12-18 LAB — URINALYSIS
Bilirubin, UA: NEGATIVE
Ketones, UA: NEGATIVE
Nitrite, UA: POSITIVE — AB
Specific Gravity, UA: 1.015 (ref 1.005–1.030)
Urobilinogen, Ur: 1 mg/dL (ref 0.2–1.0)
pH, UA: 5.5 (ref 5.0–7.5)

## 2020-12-18 MED ORDER — FLUCONAZOLE 150 MG PO TABS: ORAL_TABLET | ORAL | 0 refills | Status: DC

## 2020-12-18 MED ORDER — CEPHALEXIN 500 MG PO CAPS: 500.0000 mg | ORAL_CAPSULE | Freq: Two times a day (BID) | ORAL | 0 refills | Status: AC

## 2020-12-18 NOTE — Patient Instructions (Signed)
Urinary Tract Infection, Adult A urinary tract infection (UTI) is an infection of any part of the urinary tract. The urinary tract includes the kidneys, ureters, bladder, and urethra. These organs make, store, and get rid of urine in the body. An upper UTI affects the ureters and kidneys. A lower UTI affects the bladder and urethra. What are the causes? Most urinary tract infections are caused by bacteria in your genital area around your urethra, where urine leaves your body. These bacteria grow and cause inflammation of your urinary tract. What increases the risk? You are more likely to develop this condition if: You have a urinary catheter that stays in place. You are not able to control when you urinate or have a bowel movement (incontinence). You are female and you: Use a spermicide or diaphragm for birth control. Have low estrogen levels. Are pregnant. You have certain genes that increase your risk. You are sexually active. You take antibiotic medicines. You have a condition that causes your flow of urine to slow down, such as: An enlarged prostate, if you are female. Blockage in your urethra. A kidney stone. A nerve condition that affects your bladder control (neurogenic bladder). Not getting enough to drink, or not urinating often. You have certain medical conditions, such as: Diabetes. A weak disease-fighting system (immunesystem). Sickle cell disease. Gout. Spinal cord injury. What are the signs or symptoms? Symptoms of this condition include: Needing to urinate right away (urgency). Frequent urination. This may include small amounts of urine each time you urinate. Pain or burning with urination. Blood in the urine. Urine that smells bad or unusual. Trouble urinating. Cloudy urine. Vaginal discharge, if you are female. Pain in the abdomen or the lower back. You may also have: Vomiting or a decreased appetite. Confusion. Irritability or tiredness. A fever or  chills. Diarrhea. The first symptom in older adults may be confusion. In some cases, they may not have any symptoms until the infection has worsened. How is this diagnosed? This condition is diagnosed based on your medical history and a physical exam. You may also have other tests, including: Urine tests. Blood tests. Tests for STIs (sexually transmitted infections). If you have had more than one UTI, a cystoscopy or imaging studies may be done to determine the cause of the infections. How is this treated? Treatment for this condition includes: Antibiotic medicine. Over-the-counter medicines to treat discomfort. Drinking enough water to stay hydrated. If you have frequent infections or have other conditions such as a kidney stone, you may need to see a health care provider who specializes in the urinary tract (urologist). In rare cases, urinary tract infections can cause sepsis. Sepsis is a life-threatening condition that occurs when the body responds to an infection. Sepsis is treated in the hospital with IV antibiotics, fluids, and other medicines. Follow these instructions at home: Medicines Take over-the-counter and prescription medicines only as told by your health care provider. If you were prescribed an antibiotic medicine, take it as told by your health care provider. Do not stop using the antibiotic even if you start to feel better. General instructions Make sure you: Empty your bladder often and completely. Do not hold urine for long periods of time. Empty your bladder after sex. Wipe from front to back after urinating or having a bowel movement if you are female. Use each tissue only one time when you wipe. Drink enough fluid to keep your urine pale yellow. Keep all follow-up visits. This is important. Contact a health care provider   if: Your symptoms do not get better after 1-2 days. Your symptoms go away and then return. Get help right away if: You have severe pain in your  back or your lower abdomen. You have a fever or chills. You have nausea or vomiting. Summary A urinary tract infection (UTI) is an infection of any part of the urinary tract, which includes the kidneys, ureters, bladder, and urethra. Most urinary tract infections are caused by bacteria in your genital area. Treatment for this condition often includes antibiotic medicines. If you were prescribed an antibiotic medicine, take it as told by your health care provider. Do not stop using the antibiotic even if you start to feel better. Keep all follow-up visits. This is important. This information is not intended to replace advice given to you by your health care provider. Make sure you discuss any questions you have with your health care provider. Document Revised: 01/07/2020 Document Reviewed: 01/07/2020 Elsevier Patient Education  2022 Elsevier Inc.  

## 2020-12-18 NOTE — Progress Notes (Signed)
Acute Office Visit  Subjective:    Patient ID: KEYIA MORETTO, female    DOB: Sep 15, 1955, 65 y.o.   MRN: [id number]  Chief Complaint  Patient presents with   Abdominal Pain   Dysuria    HPI Patient is in today for UTI for 4 days. She reports dysuria and lower abdominal pain. Denies fever, chills, nausea, vomiting, or flank pain. She tried AZO without relief. There is a history of UTIs. She reports yeast infections after taking antibiotics.    Past Medical History:  Diagnosis Date   Arthritis    hands and knees   Bipolar 1 disorder (HCC)    Borderline diabetic    Chronic back pain    Chronic neck pain    Complication of anesthesia    high anxiety-does not want to be alone   COPD (chronic obstructive pulmonary disease) (HCC)    Current use of estrogen therapy 01/12/2014   Depression    Diabetes mellitus without complication ([place])    Diplopia    Family history of adverse reaction to anesthesia    sister "gas in lungs"   GERD (gastroesophageal reflux disease)    Headache    Hot flashes 12/15/2013   Hypertension    IBS (irritable bowel syndrome)    Incontinence    Kidney stone    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[place])    Panic attacks    Peptic ulcer    Peripheral neuropathy    Rosacea    Shingles    Sleep apnea    uses a cpap-with oxygen    Past Surgical History:  Procedure Laterality Date   BIOPSY  04/27/2020   Procedure: BIOPSY;  Surgeon: Ronald Lobo, MD;  Location: [facility];  Service: Endoscopy;;   BUNIONECTOMY WITH HAMMERTOE RECONSTRUCTION Right 12/10/2012   Procedure: RIGHT FIRST METATARSAL CHEVRON BUNION CORRECTION,  2 AND 3 HAMMERTOE CORRECTION , RIGHT 3 AND 4 TOE NAIL EXCISION ;  Surgeon: Wylene Simmer, MD;  Location: [place];  Service: Orthopedics;  Laterality: Right;   COLONOSCOPY WITH PROPOFOL N/A 04/27/2020   Procedure: COLONOSCOPY WITH PROPOFOL;  Surgeon: Ronald Lobo, MD;  Location: [facility];  Service: Endoscopy;   Laterality: N/A;   FOOT ARTHRODESIS  2000   both feet   LIPOSUCTION  03/2018   RECTAL SURGERY     TONSILLECTOMY     TOTAL ABDOMINAL HYSTERECTOMY      Family History  Problem Relation Age of Onset   Diabetes Mother    Other Mother        vertigo; chronic eye disease   COPD Father    COPD Sister    Alcohol abuse Brother    Diabetes Maternal Grandmother    Diabetes Maternal Grandfather    Diabetes Sister    Other Sister        hearing problems   Hyperlipidemia Sister    Alcohol abuse Brother    COPD Brother    Alcohol abuse Brother    Alcohol abuse Brother    Other Brother        aneursym    Social History   Socioeconomic History   Marital status: Divorced    Spouse name: Not on file   Number of children: 5   Years of education: 9th   Highest education level: Not on file  Occupational History   Occupation: Disabled  Tobacco Use   Smoking status: Former    Packs/day: 0.50    Years: 35.00    Pack years:  17.50    Types: Cigarettes    Quit date: 05/11/2019    Years since quitting: 1.6   Smokeless tobacco: Never  Vaping Use   Vaping Use: Never used  Substance and Sexual Activity   Alcohol use: No    Alcohol/week: 0.0 standard drinks   Drug use: No   Sexual activity: Never    Birth control/protection: Surgical    Comment: occ cigarette  Other Topics Concern   Not on file  Social History Narrative   Lives at home with home with her son (has five children).   Right-handed.   1 cup caffeine per day.   Social Determinants of Health   Financial Resource Strain: Not on file  Food Insecurity: No Food Insecurity   Worried About Charity fundraiser in the Last Year: Never true   Ran Out of Food in the Last Year: Never true  Transportation Needs: No Transportation Needs   Lack of Transportation (Medical): No   Lack of Transportation (Non-Medical): No  Physical Activity: Not on file  Stress: Not on file  Social Connections: Not on file  Intimate Partner  Violence: Not on file    Outpatient Medications Prior to Visit  Medication Sig Dispense Refill   ARIPiprazole (ABILIFY) 10 MG tablet Take 10 mg by mouth daily.     atorvastatin (LIPITOR) 40 MG tablet Take 1 tablet (40 mg total) by mouth daily. 90 tablet 1   BELSOMRA 20 MG TABS Take 1 tablet by mouth at bedtime as needed.     cefPROZIL (CEFZIL) 500 MG tablet Take 1 tablet (500 mg total) by mouth 2 (two) times daily. For infection. Take all of this medication. 20 tablet 0   cyclobenzaprine (FLEXERIL) 10 MG tablet      diazepam (VALIUM) 5 MG tablet Take 5 mg by mouth in the morning and at bedtime.      FARXIGA 10 MG TABS tablet TAKE 1 TABLET BY MOUTH ONCE DAILY WITH BREAKFAST. MAY START WITH 1/2 TABLET FOR 1 WEEK. 30 tablet 1   fluticasone (FLONASE) 50 MCG/ACT nasal spray Place 2 sprays into both nostrils daily. 16 g 5   glipiZIDE (GLUCOTROL XL) 5 MG 24 hr tablet TAKE (1) TABLET BY MOUTH ONCE DAILY WITH EVENING MEAL. 30 tablet 1   glucose blood (CONTOUR NEXT TEST) test strip Test BS 4 times daily Dx E11.9 400 strip 3   hydrocortisone cream 0.5 % Apply 1 application topically 2 (two) times daily. 30 g 0   lamoTRIgine (LAMICTAL) 200 MG tablet Take 200 mg by mouth at bedtime. For mood disorder.     lansoprazole (PREVACID) 30 MG capsule Take 1 capsule (30 mg total) by mouth daily at 12 noon. 30 capsule 12   levocetirizine (XYZAL) 5 MG tablet Take 1 tablet (5 mg total) by mouth every evening. 90 tablet 1   lisinopril (ZESTRIL) 5 MG tablet TAKE (1) TABLET BY MOUTH ONCE DAILY. 90 tablet 1   meclizine (ANTIVERT) 25 MG tablet Take 1 tablet (25 mg total) by mouth 2 (two) times daily as needed for dizziness. 30 tablet 2   montelukast (SINGULAIR) 10 MG tablet TAKE (1) TABLET BY MOUTH AT BEDTIME. 90 tablet 0   mupirocin ointment (BACTROBAN) 2 % Apply 1 application topically 2 (two) times daily. 22 g 1   ondansetron (ZOFRAN ODT) 4 MG disintegrating tablet Take 1 tablet (4 mg total) by mouth every 8 (eight)  hours as needed for nausea or vomiting. 20 tablet 0   OneTouch  Delica Lancets 44I MISC Use to test blood sugar twice daily. DX: E11.9 100 each 3   oxybutynin (DITROPAN-XL) 10 MG 24 hr tablet TAKE (1) TABLET BY MOUTH AT BEDTIME. 30 tablet 1   oxyCODONE (ROXICODONE) 15 MG immediate release tablet Take 7.5 mg by mouth every 6 (six) hours as needed.      pregabalin (LYRICA) 150 MG capsule TAKE (1) CAPSULE BY MOUTH TWICE DAILY. 60 capsule 2   PREMARIN 0.3 MG tablet TAKE (1) TABLET BY MOUTH ONCE DAILY. 30 tablet 7   sitaGLIPtin (JANUVIA) 100 MG tablet Take 1 tablet (100 mg total) by mouth daily. 30 tablet 3   temazepam (RESTORIL) 15 MG capsule Take 15 mg by mouth at bedtime.     traZODone (DESYREL) 100 MG tablet Take 100 mg by mouth at bedtime.     umeclidinium-vilanterol (ANORO ELLIPTA) 62.5-25 MCG/INH AEPB Inhale 1 puff into the lungs daily. 30 each 6   valACYclovir (VALTREX) 500 MG tablet Take 1 tablet (500 mg total) by mouth daily as needed (shingles flare up). 30 tablet 4   No facility-administered medications prior to visit.    Allergies  Allergen Reactions   Chantix [Varenicline] Other (See Comments)    Altered mental status-Per patient was put on allergy list by Dr Lorriane Shire bu patient staes she is not allergic to this and is currently taking it.    Divalproex Sodium Other (See Comments)    Hallucinations  Other reaction(s): Confusion   Other Anaphylaxis   Pseudoeph-Hydrocodone-Gg Nausea Only   Sulfa Antibiotics Anaphylaxis and Nausea Only   Varenicline Tartrate Other (See Comments)    Other reaction(s): Confusion   Ambien [Zolpidem] Other (See Comments)    Causes sleep walking   Clavulanic Acid Diarrhea   Codeine Nausea And Vomiting   Elemental Sulfur Other (See Comments)    Swelling of tongue   Hydrocodone Nausea And Vomiting   Macrobid [Nitrofurantoin] Nausea And Vomiting    Dizziness, nausea, vomiting   Paxil [Paroxetine Hcl] Other (See Comments)    Causes ringing in the  ears.    Amoxicillin Rash    Has patient had a PCN reaction causing immediate rash, facial/tongue/throat swelling, SOB or lightheadedness with hypotension: No Has patient had a PCN reaction causing severe rash involving mucus membranes or skin necrosis: No Has patient had a PCN reaction that required hospitalization: No Has patient had a PCN reaction occurring within the last 10 years: Yes If all of the above answers are "NO", then may proceed with Cephalosporin use.    Metformin And Related Diarrhea   Tape Rash    Review of Systems As per HPI.     Objective:    Physical Exam Vitals and nursing note reviewed.  Constitutional:      General: She is not in acute distress.    Appearance: She is not ill-appearing, toxic-appearing or diaphoretic.  Cardiovascular:     Rate and Rhythm: Normal rate and regular rhythm.  Pulmonary:     Effort: Pulmonary effort is normal.     Breath sounds: Normal breath sounds.  Abdominal:     General: Bowel sounds are normal. There is no distension.     Palpations: Abdomen is soft.     Tenderness: There is no abdominal tenderness. There is no right CVA tenderness or left CVA tenderness.  Skin:    General: Skin is warm and dry.  Neurological:     Mental Status: She is alert and oriented to person, place, and time.  Psychiatric:        Mood and Affect: Mood normal.        Behavior: Behavior normal.   Urine dipstick shows positive for nitrites, leukocytes, red blood cells, glucose, protein.  BP 126/69   Pulse 78   Temp (!) 97.4 F (36.3 C) (Temporal)   Ht '5\' 9"'  (1.753 m)   Wt 176 lb (79.8 kg)   SpO2 97%   BMI 25.99 kg/m  Wt Readings from Last 3 Encounters:  10/25/20 177 lb 9.6 oz (80.6 kg)  10/10/20 173 lb 3.2 oz (78.6 kg)  09/27/20 173 lb (78.5 kg)    Health Maintenance Due  Topic Date Due   Hepatitis C Screening  Never done   Zoster Vaccines- Shingrix (1 of 2) Never done   Pneumococcal Vaccine 52-50 Years old (3 - PPSV23 or PCV20)  10/02/2018   COVID-19 Vaccine (4 - Booster for Moderna series) 09/05/2020    There are no preventive care reminders to display for this patient.   Lab Results  Component Value Date   TSH 1.220 07/04/2020   Lab Results  Component Value Date   WBC 6.5 10/10/2020   HGB 15.5 10/10/2020   HCT 44.5 10/10/2020   MCV 90 10/10/2020   PLT 267 10/10/2020   Lab Results  Component Value Date   NA 142 10/10/2020   K 4.8 10/10/2020   CO2 25 10/10/2020   GLUCOSE 86 10/10/2020   BUN 16 10/10/2020   CREATININE 0.84 10/10/2020   BILITOT 0.7 10/10/2020   ALKPHOS 55 10/10/2020   AST 18 10/10/2020   ALT 13 10/10/2020   PROT 6.7 10/10/2020   ALBUMIN 5.1 (H) 10/10/2020   CALCIUM 10.1 10/10/2020   ANIONGAP 8 04/22/2018   EGFR 78 10/10/2020   Lab Results  Component Value Date   CHOL 116 10/10/2020   Lab Results  Component Value Date   HDL 40 10/10/2020   Lab Results  Component Value Date   LDLCALC 61 10/10/2020   Lab Results  Component Value Date   TRIG 72 10/10/2020   Lab Results  Component Value Date   CHOLHDL 2.9 10/10/2020   Lab Results  Component Value Date   HGBA1C 6.4 10/10/2020       Assessment & Plan:   [place] was seen today for abdominal pain and dysuria.  Diagnoses and all orders for this visit:  Acute cystitis with hematuria Keflex ordered, has tolerated in past. Diflucan also ordered as patient often has yeast infections with abx use. Micro and culture pending.  -     Urinalysis -     Urine Microscopic -     Urine Culture -     cephALEXin (KEFLEX) 500 MG capsule; Take 1 capsule (500 mg total) by mouth 2 (two) times daily for 7 days. -     fluconazole (DIFLUCAN) 150 MG tablet; Take one tablet now, and then repeat in 1 week if needed.      No orders of the defined types were placed in this encounter.    Gwenlyn Perking, FNP

## 2020-12-19 LAB — URINALYSIS, MICROSCOPIC ONLY
Bacteria, UA: NONE SEEN
Casts: NONE SEEN /lpf
WBC, UA: >30 /hpf — AB (ref 0–5)

## 2020-12-20 LAB — URINE CULTURE

## 2020-12-21 ENCOUNTER — Other Ambulatory Visit: Payer: Self-pay

## 2020-12-21 ENCOUNTER — Ambulatory Visit (INDEPENDENT_AMBULATORY_CARE_PROVIDER_SITE_OTHER): Payer: Medicare Other | Admitting: Pharmacist

## 2020-12-21 DIAGNOSIS — E11649 Type 2 diabetes mellitus with hypoglycemia without coma: Secondary | ICD-10-CM

## 2020-12-21 NOTE — Progress Notes (Signed)
Chronic Care Management Pharmacy Note  12/21/2020 Name:  Cynthia Deleon MRN:  [id number] DOB:  May 15, 1956  Summary: diabetes management  Recommendations/Changes made from today's visit: Diabetes: Uncontrolled; current treatment:GLIPIZIDE, FARXIGA;  PAST TREATMENTS-JANUVIA (D/C'D DUE TO COST), METFORMIN (DUE TO GI) WE WILL DISCONTINUE FARXIGA DUE TO RECURRENT UTI/YEAST INFECTIONS DESPITE CONTROLLED SUGARS BG HAS INCREASED NOW WITHOUT FARXIGA--> WILL TRIAL ONGLYZA (ONLY DPP4 WE HAVE SAMPLES OF) If patient tolerated well, will pursue patient assistance with AZ&me RX assistance, onglyza Current glucose readings: fasting glucose: <180, post prandial glucose: 200s Denies hypoglycemic/hyperglycemic symptoms Discussed meal planning options and Plate method for healthy eating Avoid sugary drinks and desserts Incorporate balanced protein, non starchy veggies, 1 serving of carbohydrate with each meal Increase water intake Increase physical activity as able Current exercise: active job  Educated on current medications, patient on pill packaging with [facility]--discussed d/c of farxiga, trial of onglyza Recommended trial of onglyza Continue glipizide (with dinner), start Onglyza (AM)   Plan: f/u with PharmD in 2 weeks  Subjective: Cynthia Deleon is an 65 y.o. year old female who is a primary patient of Loman Brooklyn, FNP.  The CCM team was consulted for assistance with disease management and care coordination needs.    Engaged with patient face to face for follow up visit in response to provider referral for pharmacy case management and/or care coordination services.   Consent to Services:  The patient was given information about Chronic Care Management services, agreed to services, and gave verbal consent prior to initiation of services.  Please see initial visit note for detailed documentation.   Patient Care Team: Loman Brooklyn, FNP as PCP - General (Family  Medicine) Suella Broad, MD as Consulting Physician (Physical Medicine and Rehabilitation) Norma Fredrickson, MD as Consulting Physician (Psychiatry) Ilean China, RN as Registered Nurse Phillips Odor, MD as Consulting Physician (Neurology) Lavera Guise, [facility] (Pharmacist) Warren Danes, PA-C as Physician Assistant (Dermatology)  Objective:  Lab Results  Component Value Date   CREATININE 0.84 10/10/2020   CREATININE 0.72 06/08/2020   CREATININE 0.75 03/07/2020    Lab Results  Component Value Date   HGBA1C 6.4 10/10/2020   Last diabetic Eye exam:  Lab Results  Component Value Date/Time   HMDIABEYEEXA No Retinopathy 07/25/2020 12:00 AM    Last diabetic Foot exam: No results found for: HMDIABFOOTEX      Component Value Date/Time   CHOL 116 10/10/2020 0954   TRIG 72 10/10/2020 0954   HDL 40 10/10/2020 0954   CHOLHDL 2.9 10/10/2020 0954   CHOLHDL 4.1 11/16/2015 0715   VLDL 48 (H) 11/16/2015 0715   LDLCALC 61 10/10/2020 0954    Hepatic Function Latest Ref Rng & Units 10/10/2020 03/07/2020 12/02/2019  Total Protein 6.0 - 8.5 g/dL 6.7 6.3 6.3  Albumin 3.8 - 4.8 g/dL 5.1(H) 4.6 4.4  AST 0 - 40 IU/L '18 11 16  ' ALT 0 - 32 IU/L '13 7 10  ' Alk Phosphatase 44 - 121 IU/L 55 68 84  Total Bilirubin 0.0 - 1.2 mg/dL 0.7 0.4 0.3  Bilirubin, Direct 0.1 - 0.5 mg/dL - - -    Lab Results  Component Value Date/Time   TSH 1.220 07/04/2020 09:58 AM   TSH 1.060 03/07/2020 08:52 AM    CBC Latest Ref Rng & Units 10/10/2020 06/08/2020 12/02/2019  WBC 3.4 - 10.8 x10E3/uL 6.5 11.0(H) 7.8  Hemoglobin 11.1 - 15.9 g/dL 15.5 15.8 15.2  Hematocrit 34.0 - 46.6 % 44.5 45.7  43.6  Platelets 150 - 450 x10E3/uL 267 201 214    No results found for: VD25OH  Clinical ASCVD: No  The ASCVD Risk score Mikey Bussing DC Jr., et al., 2013) failed to calculate for the following reasons:   The valid total cholesterol range is 130 to 320 mg/dL    Other: (CHADS2VASc if Afib, PHQ9 if depression, MMRC or CAT for  COPD, ACT, DEXA)  Social History   Tobacco Use  Smoking Status Former   Packs/day: 0.50   Years: 35.00   Pack years: 17.50   Types: Cigarettes   Quit date: 05/11/2019   Years since quitting: 1.6  Smokeless Tobacco Never   BP Readings from Last 3 Encounters:  12/18/20 126/69  10/25/20 118/73  10/10/20 116/72   Pulse Readings from Last 3 Encounters:  12/18/20 78  10/25/20 89  10/10/20 74   Wt Readings from Last 3 Encounters:  12/18/20 176 lb (79.8 kg)  10/25/20 177 lb 9.6 oz (80.6 kg)  10/10/20 173 lb 3.2 oz (78.6 kg)    Assessment: Review of patient past medical history, allergies, medications, health status, including review of consultants reports, laboratory and other test data, was performed as part of comprehensive evaluation and provision of chronic care management services.   SDOH:  (Social Determinants of Health) assessments and interventions performed:    CCM Care Plan  Allergies  Allergen Reactions   Chantix [Varenicline] Other (See Comments)    Altered mental status-Per patient was put on allergy list by Dr Lorriane Shire bu patient staes she is not allergic to this and is currently taking it.    Divalproex Sodium Other (See Comments)    Hallucinations  Other reaction(s): Confusion   Other Anaphylaxis   Pseudoeph-Hydrocodone-Gg Nausea Only   Sulfa Antibiotics Anaphylaxis and Nausea Only   Varenicline Tartrate Other (See Comments)    Other reaction(s): Confusion   Ambien [Zolpidem] Other (See Comments)    Causes sleep walking   Clavulanic Acid Diarrhea   Codeine Nausea And Vomiting   Elemental Sulfur Other (See Comments)    Swelling of tongue   Hydrocodone Nausea And Vomiting   Macrobid [Nitrofurantoin] Nausea And Vomiting    Dizziness, nausea, vomiting   Paxil [Paroxetine Hcl] Other (See Comments)    Causes ringing in the ears.    Amoxicillin Rash    Has patient had a PCN reaction causing immediate rash, facial/tongue/throat swelling, SOB or  lightheadedness with hypotension: No Has patient had a PCN reaction causing severe rash involving mucus membranes or skin necrosis: No Has patient had a PCN reaction that required hospitalization: No Has patient had a PCN reaction occurring within the last 10 years: Yes If all of the above answers are "NO", then may proceed with Cephalosporin use.    Wilder Glade [Dapagliflozin]     RECURRENT YEAST/UTI   Metformin And Related Diarrhea   Tape Rash    Medications Reviewed Today     Reviewed by Lavera Guise, [facility] (Pharmacist) on 01/02/21 at [phone]  Med List Status: <None>   Medication Order Taking? Sig Documenting Provider Last Dose Status Informant  ARIPiprazole (ABILIFY) 10 MG tablet [id number]  Take 10 mg by mouth daily. [provider]  Active   atorvastatin (LIPITOR) 40 MG tablet [id number]  Take 1 tablet (40 mg total) by mouth daily. [name] F, FNP  Active   BELSOMRA 20 MG TABS [id number]  Take 1 tablet by mouth at bedtime as needed. [provider]  Active   cyclobenzaprine (FLEXERIL)  10 MG tablet [id number]   [provider]  Active   diazepam (VALIUM) 5 MG tablet [id number]  Take 5 mg by mouth in the morning and at bedtime.  [provider]  Active   fluconazole (DIFLUCAN) 150 MG tablet [id number]  Take one tablet now, and then repeat in 1 week if needed. Gwenlyn Perking, FNP  Active   fluticasone [facility]) 50 MCG/ACT nasal spray [id number]  Place 2 sprays into both nostrils daily. Loman Brooklyn, FNP  Active   gabapentin (NEURONTIN) 100 MG capsule [id number]  Take by mouth. [provider]  Active   glipiZIDE (GLUCOTROL XL) 5 MG 24 hr tablet [id number]  TAKE (1) TABLET BY MOUTH ONCE DAILY WITH EVENING MEAL. Loman Brooklyn, FNP  Active   glucose blood (CONTOUR NEXT TEST) test strip [id number]  Test BS 4 times daily Dx E11.9 Loman Brooklyn, FNP  Active   hydrocortisone cream 0.5 % [id number]  Apply 1 application topically 2 (two) times  daily. Ivy Lynn, NP  Active   lamoTRIgine (LAMICTAL) 200 MG tablet [id number]  Take 200 mg by mouth at bedtime. For mood disorder. [provider]  Active   lansoprazole (PREVACID) 30 MG capsule [id number]  Take 1 capsule (30 mg total) by mouth daily at 12 noon. Loman Brooklyn, FNP  Active   levocetirizine (XYZAL) 5 MG tablet [id number]  Take 1 tablet (5 mg total) by mouth every evening. Loman Brooklyn, FNP  Active   lisinopril (ZESTRIL) 5 MG tablet [id number]  TAKE (1) TABLET BY MOUTH ONCE DAILY. Loman Brooklyn, FNP  Active   meclizine (ANTIVERT) 25 MG tablet [id number]  Take 1 tablet (25 mg total) by mouth 2 (two) times daily as needed for dizziness. Loman Brooklyn, FNP  Active   montelukast (SINGULAIR) 10 MG tablet [id number]  TAKE (1) TABLET BY MOUTH AT BEDTIME. Loman Brooklyn, FNP  Active   mupirocin ointment (BACTROBAN) 2 % [id number]  Apply 1 application topically 2 (two) times daily. Sheffield, Ronalee Red, PA-C  Active   ondansetron (ZOFRAN ODT) 4 MG disintegrating tablet [id number]  Take 1 tablet (4 mg total) by mouth every 8 (eight) hours as needed for nausea or vomiting. Loman Brooklyn, FNP  Active   OneTouch Delica Lancets 37J MISC [id number]  Use to test blood sugar twice daily. DX: E11.9 Loman Brooklyn, FNP  Active   oxybutynin (DITROPAN-XL) 10 MG 24 hr tablet [id number]  TAKE (1) TABLET BY MOUTH AT BEDTIME. Loman Brooklyn, FNP  Active   oxyCODONE (ROXICODONE) 15 MG immediate release tablet [id number]  Take 7.5 mg by mouth every 6 (six) hours as needed.  [provider]  Active Self           Med Note Rulon Abide Apr 22, 2018  3:54 PM)    pregabalin (LYRICA) 150 MG capsule [id number]  TAKE (1) CAPSULE BY MOUTH TWICE DAILY. Loman Brooklyn, FNP  Active   PREMARIN 0.3 MG tablet [id number]  TAKE (1) TABLET BY MOUTH ONCE DAILY. Loman Brooklyn, FNP  Active   saxagliptin HCl (ONGLYZA) 5 MG TABS tablet [id number] Yes Take 5 mg by mouth daily. [provider]  Active            Med Note Parthenia Ames Dec 21, 2020  2:21 PM) samples    Discontinued 01/02/21 0835   temazepam (RESTORIL) 15 MG capsule [id number]  Take 15 mg  by mouth at bedtime. [provider]  Active            Med Note Rich Reining, Elson Clan Mar 20, 2020  2:30 PM) 03/20/20 - prescribed but not started yet  traZODone (DESYREL) 100 MG tablet [id number]  Take 100 mg by mouth at bedtime. [provider]  Active   umeclidinium-vilanterol (ANORO ELLIPTA) 62.5-25 MCG/INH AEPB [id number]  Inhale 1 puff into the lungs daily. Loman Brooklyn, FNP  Active   valACYclovir (VALTREX) 500 MG tablet [id number]  Take 1 tablet (500 mg total) by mouth daily as needed (shingles flare up). Gwenlyn Perking, FNP  Active   Med List Note Loman Brooklyn, [place] 12/04/19 1526): Not Rx'd by PCP: sertraline, valium, lamotrigine, belsomra, and oxycodone - [facility] 12/02/19.            Patient Active Problem List   Diagnosis Date Noted   Polyneuropathy due to type 2 diabetes mellitus ([place]) 10/10/2020   Seasonal allergies 10/10/2020   Controlled substance agreement signed 10/10/2020   Stress incontinence of urine 06/20/2020   Diplopia 03/20/2020   GERD (gastroesophageal reflux disease)    Essential hypertension    Incontinence    COPD (chronic obstructive pulmonary disease) ([place])    Osteoarthritis of left knee 04/01/2019   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   Diabetes mellitus with stage 3 chronic kidney disease ([place]) 07/06/2018   Bilateral temporomandibular joint pain 01/21/2018   OSA (obstructive sleep apnea) 01/21/2018   Vertigo 01/21/2018   DDD (degenerative disc disease), cervical 07/17/2017   Degeneration of lumbar intervertebral disc 07/17/2017   OCD (obsessive compulsive disorder) 11/15/2015   Tobacco use disorder 11/15/2015   Current use of estrogen therapy 01/12/2014   PTSD (post-traumatic stress disorder) 08/03/2011   Bipolar I disorder, most recent episode  mixed, severe with psychotic features ([place])     Immunization History  Administered Date(s) Administered   Influenza Split 03/26/2016, 02/17/2019   Influenza,inj,Quad PF,6+ Mos 02/18/2017, 02/17/2019   Influenza,inj,quad, With Preservative 04/03/2015, 04/10/2018, 02/17/2019   Influenza-Unspecified 03/10/2020   Moderna SARS-COV2 Booster Vaccination 06/07/2020   Moderna Sars-Covid-2 Vaccination 08/19/2019, 09/23/2019   Pneumococcal Conjugate-13 10/01/2017   Pneumococcal Polysaccharide-23 04/29/2007   Tdap 11/20/2017    Conditions to be addressed/monitored: DMII  Care Plan : PHARMD MEDICATION MANAGEMENT  Updates made by Lavera Guise, [place] since 01/02/2021 12:00 AM     Problem: DISEASE PROGRESSION PREVENTION      Long-Range Goal: T2DM   This Visit's Progress: Not on track  Priority: High  Note:   Current Barriers:  Unable to independently afford treatment regimen Unable to achieve control of T2DM   Pharmacist Clinical Goal(s):  Over the next 90 days, patient will verbalize ability to afford treatment regimen achieve control of T2DM as evidenced by IMPROVED GLYCEMIC CONTROL  through collaboration with PharmD and provider.   Interventions: 1:1 collaboration with Loman Brooklyn, FNP regarding development and update of comprehensive plan of care as evidenced by provider attestation and co-signature Inter-disciplinary care team collaboration (see longitudinal plan of care) Comprehensive medication review performed; medication list updated in electronic medical record  Diabetes: Uncontrolled; current treatment:GLIPIZIDE, FARXIGA;  PAST TREATMENTS-JANUVIA (D/C'D DUE TO COST), METFORMIN (DUE TO GI) WE WILL DISCONTINUE FARXIGA DUE TO RECURRENT UTI/YEAST INFECTIONS DESPITE CONTROLLED SUGARS BG HAS INCREASED NOW WITHOUT FARXIGA--> WILL TRIAL ONGLYZA (ONLY DPP4 WE HAVE SAMPLES OF) If patient tolerated well, will pursue patient assistance with AZ&me RX assistance, onglyza Current  glucose readings: fasting glucose: <180, post prandial  glucose: 200s Denies hypoglycemic/hyperglycemic symptoms Discussed meal planning options and Plate method for healthy eating Avoid sugary drinks and desserts Incorporate balanced protein, non starchy veggies, 1 serving of carbohydrate with each meal Increase water intake Increase physical activity as able Current exercise: active job  Educated on current medications, patient on pill packaging with [facility]--discussed d/c of farxiga, trial of onglyza Recommended trial of onglyza Continue glipizide (with dinner), start Onglyza (AM)  Patient Goals/Self-Care Activities Over the next 90 days, patient will:  - take medications as prescribed check glucose daily (fasting) or if symptomatic, document, and provide at future appointments  Follow Up Plan: Telephone follow up appointment with care management team member scheduled for: 2 weeks      Medication Assistance: None required.  Patient affirms current coverage meets needs.  Patient's preferred pharmacy is:  [facility], [place] [address] [place] [address] Phone: [phone] Fax: [phone]  Uses pill box? Yes--pill packaging with [facility] endorses 100% compliance  Follow Up:  Patient agrees to Care Plan and Follow-up.  Plan: Telephone follow up appointment with care management team member scheduled for:  2 weeks   Regina Eck, PharmD, BCPS Clinical Pharmacist, [place]  [facility] Phone [phone]

## 2020-12-25 ENCOUNTER — Telehealth: Payer: Self-pay | Admitting: *Deleted

## 2020-12-25 NOTE — Telephone Encounter (Signed)
Received over the weekend phone message recommending a PCP follow up within 3 days. Patient hit her hand, free bleeder, difficulties stopping the bleed. Call went to voicemail. Left voicemail to call back.

## 2020-12-27 ENCOUNTER — Ambulatory Visit (INDEPENDENT_AMBULATORY_CARE_PROVIDER_SITE_OTHER): Payer: Medicare Other | Admitting: Family Medicine

## 2020-12-27 DIAGNOSIS — J069 Acute upper respiratory infection, unspecified: Secondary | ICD-10-CM | POA: Diagnosis not present

## 2020-12-27 DIAGNOSIS — S61419A Laceration without foreign body of unspecified hand, initial encounter: Secondary | ICD-10-CM

## 2020-12-27 LAB — VERITOR FLU A/B WAIVED
Influenza A: NEGATIVE
Influenza B: NEGATIVE

## 2020-12-27 NOTE — Progress Notes (Signed)
Virtual Visit via Telephone Note  I connected with KYARAH ENAMORADO on 12/27/20 at 9:21 AM by telephone and verified that I am speaking with the correct person using two identifiers. ANIA LEVAY is currently located at home and nobody is currently with her during this visit. The provider, Loman Brooklyn, FNP is located in their office at time of visit.  I discussed the limitations, risks, security and privacy concerns of performing an evaluation and management service by telephone and the availability of in person appointments. I also discussed with the patient that there may be a patient responsible charge related to this service. The patient expressed understanding and agreed to proceed.  Subjective: PCP: Loman Brooklyn, FNP  Chief Complaint  Patient presents with   URI   Hand Pain   Patient complains of head congestion, facial pain/pressure, postnasal drainage, and body aches . Additional symptoms include headache, runny nose, sneezing, and shortness of breath. Onset of symptoms was 1 day ago, gradually worsening since that time. She is drinking plenty of fluids. Evaluation to date: at home COVID test negative yesterday. Treatment to date: antihistamines, nasal steroids, and Sinus Serious by Vicks and Mucinex . She has a history of COPD. She does not smoke.   Patient injured her hand a few days ago coming out of the restroom. She reports she sustained a skin tear that was initially hard to control the bleeding. She reports it is getting better. She is keeping it clean, dry and covered.   ROS: Per HPI  Current Outpatient Medications:    ARIPiprazole (ABILIFY) 10 MG tablet, Take 10 mg by mouth daily., Disp: , Rfl:    atorvastatin (LIPITOR) 40 MG tablet, Take 1 tablet (40 mg total) by mouth daily., Disp: 90 tablet, Rfl: 1   BELSOMRA 20 MG TABS, Take 1 tablet by mouth at bedtime as needed., Disp: , Rfl:    cyclobenzaprine (FLEXERIL) 10 MG tablet, , Disp: , Rfl:    diazepam  (VALIUM) 5 MG tablet, Take 5 mg by mouth in the morning and at bedtime. , Disp: , Rfl:    fluconazole (DIFLUCAN) 150 MG tablet, Take one tablet now, and then repeat in 1 week if needed., Disp: 2 tablet, Rfl: 0   fluticasone (FLONASE) 50 MCG/ACT nasal spray, Place 2 sprays into both nostrils daily., Disp: 16 g, Rfl: 5   gabapentin (NEURONTIN) 100 MG capsule, Take by mouth., Disp: , Rfl:    glipiZIDE (GLUCOTROL XL) 5 MG 24 hr tablet, TAKE (1) TABLET BY MOUTH ONCE DAILY WITH EVENING MEAL., Disp: 30 tablet, Rfl: 1   glucose blood (CONTOUR NEXT TEST) test strip, Test BS 4 times daily Dx E11.9, Disp: 400 strip, Rfl: 3   hydrocortisone cream 0.5 %, Apply 1 application topically 2 (two) times daily., Disp: 30 g, Rfl: 0   lamoTRIgine (LAMICTAL) 200 MG tablet, Take 200 mg by mouth at bedtime. For mood disorder., Disp: , Rfl:    lansoprazole (PREVACID) 30 MG capsule, Take 1 capsule (30 mg total) by mouth daily at 12 noon., Disp: 30 capsule, Rfl: 12   levocetirizine (XYZAL) 5 MG tablet, Take 1 tablet (5 mg total) by mouth every evening., Disp: 90 tablet, Rfl: 1   lisinopril (ZESTRIL) 5 MG tablet, TAKE (1) TABLET BY MOUTH ONCE DAILY., Disp: 90 tablet, Rfl: 1   meclizine (ANTIVERT) 25 MG tablet, Take 1 tablet (25 mg total) by mouth 2 (two) times daily as needed for dizziness., Disp: 30 tablet, Rfl: 2  montelukast (SINGULAIR) 10 MG tablet, TAKE (1) TABLET BY MOUTH AT BEDTIME., Disp: 90 tablet, Rfl: 0   mupirocin ointment (BACTROBAN) 2 %, Apply 1 application topically 2 (two) times daily., Disp: 22 g, Rfl: 1   ondansetron (ZOFRAN ODT) 4 MG disintegrating tablet, Take 1 tablet (4 mg total) by mouth every 8 (eight) hours as needed for nausea or vomiting., Disp: 20 tablet, Rfl: 0   OneTouch Delica Lancets 25E MISC, Use to test blood sugar twice daily. DX: E11.9, Disp: 100 each, Rfl: 3   oxybutynin (DITROPAN-XL) 10 MG 24 hr tablet, TAKE (1) TABLET BY MOUTH AT BEDTIME., Disp: 30 tablet, Rfl: 1   oxyCODONE (ROXICODONE)  15 MG immediate release tablet, Take 7.5 mg by mouth every 6 (six) hours as needed. , Disp: , Rfl:    pregabalin (LYRICA) 150 MG capsule, TAKE (1) CAPSULE BY MOUTH TWICE DAILY., Disp: 60 capsule, Rfl: 2   PREMARIN 0.3 MG tablet, TAKE (1) TABLET BY MOUTH ONCE DAILY., Disp: 30 tablet, Rfl: 7   saxagliptin HCl (ONGLYZA) 5 MG TABS tablet, Take 5 mg by mouth daily., Disp: , Rfl:    sitaGLIPtin (JANUVIA) 100 MG tablet, Take 1 tablet (100 mg total) by mouth daily., Disp: 30 tablet, Rfl: 3   temazepam (RESTORIL) 15 MG capsule, Take 15 mg by mouth at bedtime., Disp: , Rfl:    traZODone (DESYREL) 100 MG tablet, Take 100 mg by mouth at bedtime., Disp: , Rfl:    umeclidinium-vilanterol (ANORO ELLIPTA) 62.5-25 MCG/INH AEPB, Inhale 1 puff into the lungs daily., Disp: 30 each, Rfl: 6   valACYclovir (VALTREX) 500 MG tablet, Take 1 tablet (500 mg total) by mouth daily as needed (shingles flare up)., Disp: 30 tablet, Rfl: 4  Allergies  Allergen Reactions   Chantix [Varenicline] Other (See Comments)    Altered mental status-Per patient was put on allergy list by Dr Lorriane Shire bu patient staes she is not allergic to this and is currently taking it.    Divalproex Sodium Other (See Comments)    Hallucinations  Other reaction(s): Confusion   Other Anaphylaxis   Pseudoeph-Hydrocodone-Gg Nausea Only   Sulfa Antibiotics Anaphylaxis and Nausea Only   Varenicline Tartrate Other (See Comments)    Other reaction(s): Confusion   Ambien [Zolpidem] Other (See Comments)    Causes sleep walking   Clavulanic Acid Diarrhea   Codeine Nausea And Vomiting   Elemental Sulfur Other (See Comments)    Swelling of tongue   Hydrocodone Nausea And Vomiting   Macrobid [Nitrofurantoin] Nausea And Vomiting    Dizziness, nausea, vomiting   Paxil [Paroxetine Hcl] Other (See Comments)    Causes ringing in the ears.    Amoxicillin Rash    Has patient had a PCN reaction causing immediate rash, facial/tongue/throat swelling, SOB or  lightheadedness with hypotension: No Has patient had a PCN reaction causing severe rash involving mucus membranes or skin necrosis: No Has patient had a PCN reaction that required hospitalization: No Has patient had a PCN reaction occurring within the last 10 years: Yes If all of the above answers are "NO", then may proceed with Cephalosporin use.    Metformin And Related Diarrhea   Tape Rash   Past Medical History:  Diagnosis Date   Arthritis    hands and knees   Bipolar 1 disorder (HCC)    Borderline diabetic    Chronic back pain    Chronic neck pain    Complication of anesthesia    high anxiety-does not want to  be alone   COPD (chronic obstructive pulmonary disease) ([place])    Current use of estrogen therapy 01/12/2014   Depression    Diabetes mellitus without complication (HCC)    Diplopia    Family history of adverse reaction to anesthesia    sister "gas in lungs"   GERD (gastroesophageal reflux disease)    Headache    Hot flashes 12/15/2013   Hypertension    IBS (irritable bowel syndrome)    Incontinence    Kidney stone    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HCC)    Panic attacks    Peptic ulcer    Peripheral neuropathy    Rosacea    Shingles    Sleep apnea    uses a cpap-with oxygen    Observations/Objective: A&O  No respiratory distress or wheezing audible over the phone Mood, judgement, and thought processes all WNL  Assessment and Plan: 1. Viral URI Discussed symptom management and expected duration of a viral illness. - Novel Coronavirus, NAA ([facility]); Future - Veritor Flu A/B Waived; Future  2. Skin tear of hand without complication, unspecified laterality, initial encounter Continue to keep clean, dry and covered.    Follow Up Instructions:  I discussed the assessment and treatment plan with the patient. The patient was provided an opportunity to ask questions and all were answered. The patient agreed with the plan and demonstrated an understanding of  the instructions.   The patient was advised to call back or seek an in-person evaluation if the symptoms worsen or if the condition fails to improve as anticipated.  The above assessment and management plan was discussed with the patient. The patient verbalized understanding of and has agreed to the management plan. Patient is aware to call the clinic if symptoms persist or worsen. Patient is aware when to return to the clinic for a follow-up visit. Patient educated on when it is appropriate to go to the emergency department.   Time call ended: 9:35 AM  I provided 14 minutes of non-face-to-face time during this encounter.  [name], MSN, APRN, FNP-C [facility] 12/27/20

## 2020-12-27 NOTE — Addendum Note (Signed)
Addended by: Reather Converse on: 12/27/2020 10:11 AM   Modules accepted: Orders

## 2020-12-28 LAB — NOVEL CORONAVIRUS, NAA: SARS-CoV-2, NAA: NOT DETECTED

## 2020-12-28 LAB — SARS-COV-2, NAA 2 DAY TAT

## 2021-01-01 ENCOUNTER — Telehealth: Payer: Self-pay | Admitting: Family Medicine

## 2021-01-01 NOTE — Telephone Encounter (Signed)
Appointment scheduled for tomorrow.

## 2021-01-02 ENCOUNTER — Ambulatory Visit (INDEPENDENT_AMBULATORY_CARE_PROVIDER_SITE_OTHER): Payer: Medicare Other | Admitting: Family

## 2021-01-02 ENCOUNTER — Encounter: Payer: Self-pay | Admitting: Family

## 2021-01-02 ENCOUNTER — Ambulatory Visit (INDEPENDENT_AMBULATORY_CARE_PROVIDER_SITE_OTHER): Payer: Medicare Other

## 2021-01-02 VITALS — BP 113/71 | HR 90 | Temp 98.1°F | Ht 69.0 in | Wt 178.0 lb

## 2021-01-02 DIAGNOSIS — B370 Candidal stomatitis: Secondary | ICD-10-CM

## 2021-01-02 DIAGNOSIS — J449 Chronic obstructive pulmonary disease, unspecified: Secondary | ICD-10-CM | POA: Diagnosis not present

## 2021-01-02 DIAGNOSIS — J441 Chronic obstructive pulmonary disease with (acute) exacerbation: Secondary | ICD-10-CM | POA: Diagnosis not present

## 2021-01-02 DIAGNOSIS — E1142 Type 2 diabetes mellitus with diabetic polyneuropathy: Secondary | ICD-10-CM | POA: Diagnosis not present

## 2021-01-02 DIAGNOSIS — G4733 Obstructive sleep apnea (adult) (pediatric): Secondary | ICD-10-CM | POA: Diagnosis not present

## 2021-01-02 MED ORDER — DOXYCYCLINE HYCLATE 100 MG PO TABS: 100.0000 mg | ORAL_TABLET | Freq: Two times a day (BID) | ORAL | 0 refills | Status: DC

## 2021-01-02 MED ORDER — FLUCONAZOLE 150 MG PO TABS
150.0000 mg | ORAL_TABLET | ORAL | 0 refills | Status: DC | PRN
Start: 2021-01-02 — End: 2021-02-20

## 2021-01-02 MED ORDER — NYSTATIN 100000 UNIT/ML MT SUSP: 5.0000 mL | Freq: Four times a day (QID) | OROMUCOSAL | 0 refills | Status: DC

## 2021-01-02 MED ORDER — PREDNISONE 20 MG PO TABS: 40.0000 mg | ORAL_TABLET | Freq: Every day | ORAL | 0 refills | Status: AC

## 2021-01-02 NOTE — Progress Notes (Signed)
Subjective:    [name], female    DOB: 1955-11-16, 65 y.o.   MRN: [id number]  Chief Complaint  Patient presents with   Cough    COVId and flu test last week was -   Nasal Congestion    Started Sunday     Cough This is a new problem. The current episode started in the past 7 days. The problem has been waxing and waning. The problem occurs every few minutes. The cough is Non-productive. Associated symptoms include chills, headaches, myalgias, nasal congestion, postnasal drip, shortness of breath and wheezing. Pertinent negatives include no ear congestion, ear pain, fever or sore throat. She has tried rest and OTC cough suppressant for the symptoms. The treatment provided moderate relief. Her past medical history is significant for COPD.     Review of Systems  Constitutional:  Positive for chills. Negative for fever.  HENT:  Positive for postnasal drip. Negative for ear pain and sore throat.   Respiratory:  Positive for cough, shortness of breath and wheezing.   Musculoskeletal:  Positive for myalgias.  Neurological:  Positive for headaches.  All other systems reviewed and are negative.     Objective:   Physical Exam Vitals reviewed.  Constitutional:      General: She is not in acute distress.    Appearance: She is well-developed.  HENT:     Head: Normocephalic and atraumatic.     Right Ear: Tympanic membrane normal.     Left Ear: Tympanic membrane normal.     Mouth/Throat:     Pharynx: Pharyngeal swelling and posterior oropharyngeal erythema present.     Comments: White coating on tongue Eyes:     Pupils: Pupils are equal, round, and reactive to light.  Neck:     Thyroid: No thyromegaly.  Cardiovascular:     Rate and Rhythm: Normal rate and regular rhythm.     Heart sounds: Normal heart sounds. No murmur heard. Pulmonary:     Effort: Pulmonary effort is normal. No respiratory distress.     Breath sounds: Normal breath sounds. No wheezing.      Comments: Coarse cough nonproductive cough  Abdominal:     General: Bowel sounds are normal. There is no distension.     Palpations: Abdomen is soft.     Tenderness: There is no abdominal tenderness.  Musculoskeletal:        General: No tenderness. Normal range of motion.     Cervical back: Normal range of motion and neck supple.  Skin:    General: Skin is warm and dry.  Neurological:     Mental Status: She is alert and oriented to person, place, and time.     Cranial Nerves: No cranial nerve deficit.     Deep Tendon Reflexes: Reflexes are normal and symmetric.  Psychiatric:        Behavior: Behavior normal.        Thought Content: Thought content normal.        Judgment: Judgment normal.     BP 113/71   Pulse 90   Temp 98.1 F (36.7 C) (Temporal)   Ht '5\' 9"'$  (1.753 m)   Wt 178 lb (80.7 kg)   SpO2 91%   BMI 26.29 kg/m       Assessment & Plan:  Cynthia Deleon comes in today with chief complaint of Cough (COVId and flu test last week was -) and Nasal Congestion (Started Sunday )   Diagnosis and orders addressed:  1. Chronic obstructive pulmonary disease with acute exacerbation (HCC) - Take meds as prescribed - Use a cool mist humidifier  -Use saline nose sprays frequently -Force fluids -For any cough or congestion  Use plain Mucinex- regular strength or max strength is fine -For fever or aces or pains- take tylenol or ibuprofen. -Throat lozenges if help - doxycycline (VIBRA-TABS) 100 MG tablet; Take 1 tablet (100 mg total) by mouth 2 (two) times daily.  Dispense: 20 tablet; Refill: 0 - DG Chest 2 View; Future - predniSONE (DELTASONE) 20 MG tablet; Take 2 tablets (40 mg total) by mouth daily with breakfast for 4 days.  Dispense: 8 tablet; Refill: 0  2. Oral thrush Start nystatin and diflucan  - nystatin (MYCOSTATIN) 100000 UNIT/ML suspension; Take 5 mLs (500,000 Units total) by mouth 4 (four) times daily.  Dispense: 473 mL; Refill: 0 - fluconazole (DIFLUCAN) 150  MG tablet; Take 1 tablet (150 mg total) by mouth every three (3) days as needed.  Dispense: 3 tablet; Refill: 0  Cynthia Dun, FNP

## 2021-01-02 NOTE — Patient Instructions (Signed)
Chronic Obstructive Pulmonary Disease °Chronic obstructive pulmonary disease (COPD) is a long-term (chronic) condition that affects the lungs. COPD is a general term that can be used to describe many different lung problems that cause lung inflammation and limit airflow, including chronic bronchitis and emphysema. °If you have COPD, your lung function will probably never return to normal. In most cases, it gets worse over time. However, there are steps you can take to slow the progression of the disease and improve your quality of life. °What are the causes? °This condition may be caused by: °Smoking. This is the most common cause. °Certain genes passed down through families. °What increases the risk? °The following factors may make you more likely to develop this condition: °Being exposed to secondhand smoke from cigarettes, pipes, or cigars. °Being exposed to chemicals and other irritants, such as fumes and dust in the work environment. °Having chronic lung conditions or infections. °What are the signs or symptoms? °Symptoms of this condition include: °Shortness of breath, especially during physical activity. °Chronic cough with a large amount of thick mucus. Sometimes, the cough may not have any mucus (dry cough). °Wheezing and rapid breathing. °Gray or bluish discoloration (cyanosis) of the skin, especially in the fingers, toes, or lips. °Feeling tired (fatigue). °Weight loss. °Chest tightness. °Frequent infections. °Episodes when breathing symptoms become much worse (exacerbations). °At the later stages of this disease, you may have swelling in the ankles, feet, or legs. °How is this diagnosed? °This condition is diagnosed based on: °Your medical history. °A physical exam. °You may also have tests, including: °Lung (pulmonary) function tests. This may include a spirometry test, which measures your ability to exhale properly. °Chest X-ray. °CT scan. °Blood tests. °How is this treated? °This condition may be  treated with: °Medicines. These may include inhaled rescue medicines to treat acute exacerbations as well as medicines that you take long-term (maintenance medicines) to prevent flare-ups of COPD. °Bronchodilators help treat COPD by dilating the airways to allow increased airflow and make your breathing more comfortable. °Steroids can reduce airway inflammation and help prevent exacerbations. °Smoking cessation. If you smoke, your health care provider may ask you to quit, and may also recommend therapy or replacement products to help you quit. °Pulmonary rehabilitation. This may involve working with a team of health care providers and specialists, such as respiratory, occupational, and physical therapists. °Exercise and physical activity. These are beneficial for nearly all people with COPD. °Nutrition therapy to gain weight, if you are underweight. °Oxygen. Supplemental oxygen therapy is only helpful if you have a low oxygen level in your blood (hypoxemia). °Lung surgery or transplant. °Palliative care. This is to help people with COPD feel comfortable when treatment is no longer working. °Follow these instructions at home: °Medicines °Take over-the-counter and prescription medicines only as told by your health care provider. This includes inhaled medicines and pills. °Talk to your health care provider before taking any cough or allergy medicines. You may need to avoid certain medicines that dry out your airways. °Lifestyle °If you smoke, the most important thing that you can do is to stop smoking. Continuing to smoke will cause the disease to progress faster. °Do not use any products that contain nicotine or tobacco. These products include cigarettes, chewing tobacco, and vaping devices, such as e-cigarettes. If you need help quitting, ask your health care provider. °Avoid exposure to things that irritate your lungs, such as smoke, chemicals, and fumes. °Stay active, but balance activity with periods of rest.  Exercise and physical   activity will help you maintain your ability to do things you want to do. °Learn and use relaxation techniques to manage stress and to control your breathing. °Get the right amount of sleep and get quality sleep. Most adults need 7 or more hours per night. °Eat healthy foods. Eating smaller, more frequent meals and resting before meals may help you maintain your strength. °Controlled breathing °Learn and use controlled breathing techniques as directed by your health care provider. Controlled breathing techniques include: °Pursed lip breathing. Start by breathing in (inhaling) through your nose for 1 second. Then, purse your lips as if you were going to whistle and breathe out (exhale) through the pursed lips for 2 seconds. °Diaphragmatic breathing. Start by putting one hand on your abdomen just above your waist. Inhale slowly through your nose. The hand on your abdomen should move out. Then purse your lips and exhale slowly. You should be able to feel the hand on your abdomen moving in as you exhale. ° °Controlled coughing °Learn and use controlled coughing to clear mucus from your lungs. Controlled coughing is a series of short, progressive coughs. The steps of controlled coughing are: °Lean your head slightly forward. °Breathe in deeply using diaphragmatic breathing. °Try to hold your breath for 3 seconds. °Keep your mouth slightly open while coughing twice. °Spit any mucus out into a tissue. °Rest and repeat the steps once or twice as needed. °General instructions °Make sure you receive all the vaccines that your health care provider recommends, especially the pneumococcal and influenza vaccines. Preventing infection and hospitalization is very important when you have COPD. °Drink enough fluid to keep your urine pale yellow, unless you have a medical condition that requires fluid restriction. °Use oxygen therapy and pulmonary rehabilitation if told by your health care provider. If you  require home oxygen therapy, ask your health care provider whether you should purchase a pulse oximeter to measure your oxygen level at home. °Work with your health care provider to develop a COPD action plan. This will help you know what steps to take if your condition gets worse. °Keep other chronic health conditions under control as told by your health care provider. °Avoid extreme temperature and humidity changes. °Avoid contact with people who have an illness that spreads from person to person (is contagious), such as viral infections or pneumonia. °Keep all follow-up visits. This is important. °Contact a health care provider if: °You are coughing up more mucus than usual. °There is a change in the color or thickness of your mucus. °Your breathing is more labored than usual. °Your breathing is faster than usual. °You have difficulty sleeping. °You need to use your rescue medicines or inhalers more often than expected. °You have trouble doing routine activities such as getting dressed or walking around the house. °Get help right away if: °You have shortness of breath while you are resting. °You have shortness of breath that prevents you from: °Being able to talk. °Performing your usual physical activities. °You have chest pain lasting longer than 5 minutes. °Your skin color is more blue (cyanotic) than usual. °You measure low oxygen saturations for longer than 5 minutes with a pulse oximeter. °You have a fever. °You feel too tired to breathe normally. °These symptoms may represent a serious problem that is an emergency. Do not wait to see if the symptoms will go away. Get medical help right away. Call your local emergency services (911 in the [place] Do not drive yourself to the hospital. °Summary °Chronic obstructive pulmonary   disease (COPD) is a long-term (chronic) condition that affects the lungs. °Your lung function will probably never return to normal. In most cases, it gets worse over time. However, there  are steps you can take to slow the progression of the disease and improve your quality of life. °Treatment for COPD may include taking medicines, quitting smoking, pulmonary rehabilitation, and changes to diet and exercise. As the disease progresses, you may need oxygen therapy, a lung transplant, or palliative care. °To help manage your condition, do not smoke, avoid exposure to things that irritate your lungs, stay up to date on all vaccines, and follow your health care provider's instructions for taking medicines. °This information is not intended to replace advice given to you by your health care provider. Make sure you discuss any questions you have with your health care provider. °Document Revised: 04/04/2020 Document Reviewed: 04/04/2020 °Elsevier Patient Education © 2022 [employer] ° °

## 2021-01-02 NOTE — Patient Instructions (Signed)
Visit Information  PATIENT GOALS:  Goals Addressed               This Visit's Progress     Patient Stated     T2DM-PharmD (pt-stated)        Current Barriers:  Unable to independently afford treatment regimen Unable to achieve control of T2DM   Pharmacist Clinical Goal(s):  Over the next 90 days, patient will verbalize ability to afford treatment regimen achieve control of T2DM as evidenced by IMPROVED GLYCEMIC CONTROL through collaboration with PharmD and provider.   Interventions: 1:1 collaboration with Loman Brooklyn, FNP regarding development and update of comprehensive plan of care as evidenced by provider attestation and co-signature Inter-disciplinary care team collaboration (see longitudinal plan of care) Comprehensive medication review performed; medication list updated in electronic medical record  Diabetes: Uncontrolled; current treatment:GLIPIZIDE, FARXIGA;  PAST TREATMENTS-JANUVIA (D/C'D DUE TO COST), METFORMIN (DUE TO GI) WE WILL DISCONTINUE FARXIGA DUE TO RECURRENT UTI/YEAST INFECTIONS DESPITE CONTROLLED SUGARS BG HAS INCREASED NOW WITHOUT FARXIGA--> WILL TRIAL ONGLYZA (ONLY DPP4 WE HAVE SAMPLES OF) If patient tolerated well, will pursue patient assistance with [place]&me RX assistance, onglyza Current glucose readings: fasting glucose: <180, post prandial glucose: 200s Denies hypoglycemic/hyperglycemic symptoms Discussed meal planning options and Plate method for healthy eating Avoid sugary drinks and desserts Incorporate balanced protein, non starchy veggies, 1 serving of carbohydrate with each meal Increase water intake Increase physical activity as able Current exercise: active job  Educated on current medications, patient on pill packaging with [facility]--discussed d/c of farxiga, trial of onglyza Recommended trial of onglyza Continue glipizide, start Onglyza  Patient Goals/Self-Care Activities Over the next 90 days, patient will:  - take  medications as prescribed check glucose daily (fasting) or if symptomatic, document, and provide at future appointments  Follow Up Plan: Telephone follow up appointment with care management team member scheduled for: 2 weeks         The patient verbalized understanding of instructions, educational materials, and care plan provided today and declined offer to receive copy of patient instructions, educational materials, and care plan.   Telephone follow up appointment with care management team member scheduled for: 2 weeks  Signature Regina Eck, PharmD, BCPS Clinical Pharmacist, [facility] Phone [phone]

## 2021-01-05 ENCOUNTER — Ambulatory Visit: Payer: Self-pay | Admitting: Pharmacist

## 2021-01-05 DIAGNOSIS — E11649 Type 2 diabetes mellitus with hypoglycemia without coma: Secondary | ICD-10-CM | POA: Diagnosis not present

## 2021-01-09 ENCOUNTER — Telehealth: Payer: Self-pay | Admitting: Pharmacist

## 2021-01-09 MED ORDER — SITAGLIPTIN PHOSPHATE 100 MG PO TABS: 100.0000 mg | ORAL_TABLET | Freq: Every day | ORAL | 3 refills | Status: DC

## 2021-01-09 NOTE — Progress Notes (Signed)
Chronic Care Management Pharmacy Note 01/05/2021 Name:  Cynthia Deleon MRN:  [id number] DOB:  June 09, 1956  Summary: DIABETES MANAGEMENT  Recommendations/Changes made from today's visit: Diabetes: Uncontrolled; current treatment:GLIPIZIDE, ONGLYZA PAST TREATMENTS-JANUVIA (D/C'D DUE TO COST), METFORMIN (DUE TO GI) DISCONTINUED FARXIGA DUE TO RECURRENT UTI/YEAST INFECTIONS DESPITE CONTROLLED SUGARS BG IMPROVED ON ONGLYZA (ONLY DPP4 WE HAVE SAMPLES OF) If patient tolerated well, will pursue patient assistance with AZ&me RX assistance, onglyza Patient tolerating onglyza well--will call in [place] to see if covered Refuses injection (GLP1) Current glucose readings: fasting glucose: <180, post prandial glucose: 200s Denies hypoglycemic/hyperglycemic symptoms Current exercise: active job  Educated on current medications, patient on pill packaging with [facility]--discussed d/c of farxiga, trial of onglyza Continue glipizide (with dinner), CONTINUE Onglyza (AM)--POTENTIALLY SWITCH TO [place] PENDING INSURANCE  Follow Up Plan: Telephone follow up appointment with care management team member scheduled for: 2 weeks  Subjective: Cynthia Deleon is an 65 y.o. year old female who is a primary patient of Loman Brooklyn, FNP.  The CCM team was consulted for assistance with disease management and care coordination needs.    Engaged with patient by telephone for follow up visit in response to provider referral for pharmacy case management and/or care coordination services.   Consent to Services:  The patient was given information about Chronic Care Management services, agreed to services, and gave verbal consent prior to initiation of services.  Please see initial visit note for detailed documentation.   Patient Care Team: Loman Brooklyn, FNP as PCP - General (Family Medicine) Suella Broad, MD as Consulting Physician (Physical Medicine and Rehabilitation) Norma Fredrickson, MD as  Consulting Physician (Psychiatry) Ilean China, RN as Registered Nurse Phillips Odor, MD as Consulting Physician (Neurology) Lavera Guise, [facility] (Pharmacist) Warren Danes, PA-C as Physician Assistant (Dermatology)   Objective:  Lab Results  Component Value Date   CREATININE 0.84 10/10/2020   CREATININE 0.72 06/08/2020   CREATININE 0.75 03/07/2020    Lab Results  Component Value Date   HGBA1C 6.4 10/10/2020   Last diabetic Eye exam:  Lab Results  Component Value Date/Time   HMDIABEYEEXA No Retinopathy 07/25/2020 12:00 AM    Last diabetic Foot exam: No results found for: HMDIABFOOTEX      Component Value Date/Time   CHOL 116 10/10/2020 0954   TRIG 72 10/10/2020 0954   HDL 40 10/10/2020 0954   CHOLHDL 2.9 10/10/2020 0954   CHOLHDL 4.1 11/16/2015 0715   VLDL 48 (H) 11/16/2015 0715   LDLCALC 61 10/10/2020 0954    Hepatic Function Latest Ref Rng & Units 10/10/2020 03/07/2020 12/02/2019  Total Protein 6.0 - 8.5 g/dL 6.7 6.3 6.3  Albumin 3.8 - 4.8 g/dL 5.1(H) 4.6 4.4  AST 0 - 40 IU/L '18 11 16  ' ALT 0 - 32 IU/L '13 7 10  ' Alk Phosphatase 44 - 121 IU/L 55 68 84  Total Bilirubin 0.0 - 1.2 mg/dL 0.7 0.4 0.3  Bilirubin, Direct 0.1 - 0.5 mg/dL - - -    Lab Results  Component Value Date/Time   TSH 1.220 07/04/2020 09:58 AM   TSH 1.060 03/07/2020 08:52 AM    CBC Latest Ref Rng & Units 10/10/2020 06/08/2020 12/02/2019  WBC 3.4 - 10.8 x10E3/uL 6.5 11.0(H) 7.8  Hemoglobin 11.1 - 15.9 g/dL 15.5 15.8 15.2  Hematocrit 34.0 - 46.6 % 44.5 45.7 43.6  Platelets 150 - 450 x10E3/uL 267 201 214    No results found for: VD25OH  Clinical ASCVD: No  The ASCVD Risk score Mikey Bussing DC Jr., et al., 2013) failed to calculate for the following reasons:   The valid total cholesterol range is 130 to 320 mg/dL    Other: (CHADS2VASc if Afib, PHQ9 if depression, MMRC or CAT for COPD, ACT, DEXA)  Social History   Tobacco Use  Smoking Status Former   Packs/day: 0.50   Years: 35.00   Pack  years: 17.50   Types: Cigarettes   Quit date: 05/11/2019   Years since quitting: 1.6  Smokeless Tobacco Never   BP Readings from Last 3 Encounters:  01/02/21 113/71  12/18/20 126/69  10/25/20 118/73   Pulse Readings from Last 3 Encounters:  01/02/21 90  12/18/20 78  10/25/20 89   Wt Readings from Last 3 Encounters:  01/02/21 178 lb (80.7 kg)  12/18/20 176 lb (79.8 kg)  10/25/20 177 lb 9.6 oz (80.6 kg)    Assessment: Review of patient past medical history, allergies, medications, health status, including review of consultants reports, laboratory and other test data, was performed as part of comprehensive evaluation and provision of chronic care management services.   SDOH:  (Social Determinants of Health) assessments and interventions performed:    CCM Care Plan  Allergies  Allergen Reactions   Chantix [Varenicline] Other (See Comments)    Altered mental status-Per patient was put on allergy list by Dr Lorriane Shire bu patient staes she is not allergic to this and is currently taking it.    Divalproex Sodium Other (See Comments)    Hallucinations  Other reaction(s): Confusion   Other Anaphylaxis   Pseudoeph-Hydrocodone-Gg Nausea Only   Sulfa Antibiotics Anaphylaxis and Nausea Only   Varenicline Tartrate Other (See Comments)    Other reaction(s): Confusion   Ambien [Zolpidem] Other (See Comments)    Causes sleep walking   Clavulanic Acid Diarrhea   Codeine Nausea And Vomiting   Elemental Sulfur Other (See Comments)    Swelling of tongue   Hydrocodone Nausea And Vomiting   Macrobid [Nitrofurantoin] Nausea And Vomiting    Dizziness, nausea, vomiting   Paxil [Paroxetine Hcl] Other (See Comments)    Causes ringing in the ears.    Amoxicillin Rash    Has patient had a PCN reaction causing immediate rash, facial/tongue/throat swelling, SOB or lightheadedness with hypotension: No Has patient had a PCN reaction causing severe rash involving mucus membranes or skin necrosis:  No Has patient had a PCN reaction that required hospitalization: No Has patient had a PCN reaction occurring within the last 10 years: Yes If all of the above answers are "NO", then may proceed with Cephalosporin use.    Wilder Glade [Dapagliflozin]     RECURRENT YEAST/UTI   Metformin And Related Diarrhea   Tape Rash    Medications Reviewed Today     Reviewed by Sharion Balloon, FNP (Family Nurse Practitioner) on 01/02/21 at 1638  Med List Status: <None>   Medication Order Taking? Sig Documenting Provider Last Dose Status Informant  ARIPiprazole (ABILIFY) 10 MG tablet [id number] No Take 10 mg by mouth daily. [provider] Taking Active   atorvastatin (LIPITOR) 40 MG tablet [id number] No Take 1 tablet (40 mg total) by mouth daily. [name] F, FNP Taking Active   BELSOMRA 20 MG TABS [id number] No Take 1 tablet by mouth at bedtime as needed. [provider] Taking Active   cyclobenzaprine (FLEXERIL) 10 MG tablet [id number] No  [provider] Taking Active   diazepam (VALIUM) 5 MG tablet [id number] No Take 5 mg  by mouth in the morning and at bedtime.  [provider] Taking Active   doxycycline (VIBRA-TABS) 100 MG tablet [id number] Yes Take 1 tablet (100 mg total) by mouth 2 (two) times daily. [name] A, FNP  Active   fluconazole (DIFLUCAN) 150 MG tablet [id number] Yes Take 1 tablet (150 mg total) by mouth every three (3) days as needed. [name] A, FNP  Active   fluticasone (FLONASE) 50 MCG/ACT nasal spray [id number] No Place 2 sprays into both nostrils daily. Loman Brooklyn, FNP Taking Active   gabapentin (NEURONTIN) 100 MG capsule [id number]  Take by mouth. [provider]  Active   glipiZIDE (GLUCOTROL XL) 5 MG 24 hr tablet [id number]  TAKE (1) TABLET BY MOUTH ONCE DAILY WITH EVENING MEAL. Loman Brooklyn, FNP  Active   glucose blood (CONTOUR NEXT TEST) test strip [id number] No Test BS 4 times daily Dx E11.9 Loman Brooklyn, FNP Taking  Active   hydrocortisone cream 0.5 % [id number] No Apply 1 application topically 2 (two) times daily. Ivy Lynn, NP Taking Active   lamoTRIgine (LAMICTAL) 200 MG tablet [id number] No Take 200 mg by mouth at bedtime. For mood disorder. [provider] Taking Active   lansoprazole (PREVACID) 30 MG capsule [id number] No Take 1 capsule (30 mg total) by mouth daily at 12 noon. Loman Brooklyn, FNP Taking Active   levocetirizine (XYZAL) 5 MG tablet [id number] No Take 1 tablet (5 mg total) by mouth every evening. Loman Brooklyn, FNP Taking Active   lisinopril (ZESTRIL) 5 MG tablet [id number] No TAKE (1) TABLET BY MOUTH ONCE DAILY. Loman Brooklyn, FNP Taking Active   meclizine (ANTIVERT) 25 MG tablet [id number] No Take 1 tablet (25 mg total) by mouth 2 (two) times daily as needed for dizziness. Loman Brooklyn, FNP Taking Active   montelukast (SINGULAIR) 10 MG tablet [id number]  TAKE (1) TABLET BY MOUTH AT BEDTIME. Loman Brooklyn, FNP  Active   mupirocin ointment (BACTROBAN) 2 % [id number] No Apply 1 application topically 2 (two) times daily. Warren Danes, PA-C Taking Active   nystatin (MYCOSTATIN) 100000 UNIT/ML suspension [id number] Yes Take 5 mLs (500,000 Units total) by mouth 4 (four) times daily. [name] A, FNP  Active   ondansetron (ZOFRAN ODT) 4 MG disintegrating tablet [id number] No Take 1 tablet (4 mg total) by mouth every 8 (eight) hours as needed for nausea or vomiting. Loman Brooklyn, FNP Taking Active   [facility] Lancets 35W MISC [id number] No Use to test blood sugar twice daily. DX: E11.9 Loman Brooklyn, FNP Taking Active   oxybutynin (DITROPAN-XL) 10 MG 24 hr tablet [id number]  TAKE (1) TABLET BY MOUTH AT BEDTIME. Loman Brooklyn, FNP  Active   oxyCODONE (ROXICODONE) 15 MG immediate release tablet [id number] No Take 7.5 mg by mouth every 6 (six) hours as needed.  [provider] Taking Active Self           Med Note Rulon Abide Apr 22, 2018   3:54 PM)    predniSONE (DELTASONE) 20 MG tablet [id number] Yes Take 2 tablets (40 mg total) by mouth daily with breakfast for 4 days. [name] A, FNP  Active   pregabalin (LYRICA) 150 MG capsule [id number] No TAKE (1) CAPSULE BY MOUTH TWICE DAILY. Loman Brooklyn, FNP Taking Active   PREMARIN 0.3 MG tablet [id number] No TAKE (1) TABLET BY MOUTH ONCE DAILY. Loman Brooklyn, FNP Taking Active   saxagliptin HCl (  ONGLYZA) 5 MG TABS tablet [id number]  Take 5 mg by mouth daily. [provider]  Active            Med Note Blanca Friend, Kinga Cassar D   Thu Dec 21, 2020  2:21 PM) samples  temazepam (RESTORIL) 15 MG capsule [id number] No Take 15 mg by mouth at bedtime. [provider] Taking Active            Med Note Redmond Baseman Mar 20, 2020  2:30 PM) 03/20/20 - prescribed but not started yet  traZODone (DESYREL) 100 MG tablet [id number] No Take 100 mg by mouth at bedtime. [provider] Taking Active   umeclidinium-vilanterol (ANORO ELLIPTA) 62.5-25 MCG/INH AEPB [id number] No Inhale 1 puff into the lungs daily. Loman Brooklyn, FNP Taking Active   valACYclovir (VALTREX) 500 MG tablet [id number] No Take 1 tablet (500 mg total) by mouth daily as needed (shingles flare up). Gwenlyn Perking, FNP Taking Active   Med List Note Loman Brooklyn, [place] 12/04/19 1526): Not Rx'd by PCP: sertraline, valium, lamotrigine, belsomra, and oxycodone - [facility] 12/02/19.            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   Diabetes mellitus with stage 3  chronic kidney disease ([place]) 07/06/2018   Bilateral temporomandibular joint pain 01/21/2018   OSA (obstructive sleep apnea) 01/21/2018   Vertigo 01/21/2018   DDD (degenerative disc disease), cervical 07/17/2017   Degeneration of lumbar intervertebral disc 07/17/2017   OCD (obsessive compulsive disorder) 11/15/2015   Tobacco use disorder 11/15/2015   Current use of estrogen therapy 01/12/2014   PTSD (post-traumatic stress disorder) 08/03/2011   Bipolar I disorder, most recent episode mixed, severe with psychotic features ([place])     Immunization History  Administered Date(s) Administered   Influenza Split 03/26/2016, 02/17/2019   Influenza,inj,Quad PF,6+ Mos 02/18/2017, 02/17/2019   Influenza,inj,quad, With Preservative 04/03/2015, 04/10/2018, 02/17/2019   Influenza-Unspecified 03/10/2020   Moderna SARS-COV2 Booster Vaccination 06/07/2020   Moderna Sars-Covid-2 Vaccination 08/19/2019, 09/23/2019   Pneumococcal Conjugate-13 10/01/2017   Pneumococcal Polysaccharide-23 04/29/2007   Tdap 11/20/2017    Conditions to be addressed/monitored: DMII  Care Plan : PHARMD MEDICATION MANAGEMENT  Updates made by Lavera Guise, [place] since 01/09/2021 12:00 AM     Problem: DISEASE PROGRESSION PREVENTION      Long-Range Goal: T2DM   Recent Progress: Not on track  Priority: High  Note:   Current Barriers:  Unable to independently afford treatment regimen Unable to achieve control of T2DM   Pharmacist Clinical Goal(s):  Over the next 90 days, patient will verbalize ability to afford treatment regimen achieve control of T2DM as evidenced by IMPROVED GLYCEMIC CONTROL  through collaboration with PharmD and provider.   Interventions: 1:1 collaboration with Loman Brooklyn, FNP regarding development and update of comprehensive plan of care as evidenced by provider attestation and co-signature Inter-disciplinary care team collaboration (see longitudinal plan of care) Comprehensive medication  review performed; medication list updated in electronic medical record  Diabetes: Uncontrolled; current treatment:GLIPIZIDE, ONGLYZA PAST TREATMENTS-JANUVIA (D/C'D DUE TO  COST), METFORMIN (DUE TO GI) DISCONTINUED FARXIGA DUE TO RECURRENT UTI/YEAST INFECTIONS DESPITE CONTROLLED SUGARS BG IMPROVED ON ONGLYZA (ONLY DPP4 WE HAVE SAMPLES OF) If patient tolerated well, will pursue patient assistance with AZ&me RX assistance, onglyza Patient tolerating onglyza well--will call in [place] to see if covered Refuses injection (GLP1) Current glucose readings: fasting glucose: <180, post prandial glucose: 200s Denies hypoglycemic/hyperglycemic symptoms Discussed meal planning options and Plate method for healthy eating Avoid sugary drinks and desserts Incorporate balanced protein, non starchy veggies, 1 serving of carbohydrate with each meal Increase water intake Increase physical activity as able Current exercise: active job  Educated on current medications, patient on pill packaging with [facility]--discussed d/c of farxiga, trial of onglyza Recommended trial of onglyza Continue glipizide (with dinner), CONTINUE Onglyza (AM)--POTENTIALLY SWITCH TO [place] PENDING INSURANCE  Patient Goals/Self-Care Activities Over the next 90 days, patient will:  - take medications as prescribed check glucose daily (fasting) or if symptomatic, document, and provide at future appointments  Follow Up Plan: Telephone follow up appointment with care management team member scheduled for: 2 weeks      Medication Assistance: None required.  Patient affirms current coverage meets needs.  Patient's preferred pharmacy is:  [facility], [place] [address] [place] [address] Phone: [phone] Fax: [phone]  Uses pill box? Yes PILL PACKAGING WITH [facility] Pt endorses 100% compliance  Follow Up:  Patient agrees to Care Plan and  Follow-up.  Plan: Telephone follow up appointment with care management team member scheduled for:  2 WEEKS  Regina Eck, PharmD, BCPS Clinical Pharmacist, [place]  II Phone [phone]

## 2021-01-09 NOTE — Patient Instructions (Signed)
Visit Information  PATIENT GOALS:  Goals Addressed               This Visit's Progress     Patient Stated     T2DM-PharmD (pt-stated)        Current Barriers:  Unable to independently afford treatment regimen Unable to achieve control of T2DM   Pharmacist Clinical Goal(s):  Over the next 90 days, patient will verbalize ability to afford treatment regimen achieve control of T2DM as evidenced by IMPROVED GLYCEMIC CONTROL through collaboration with PharmD and provider.   Interventions: 1:1 collaboration with Loman Brooklyn, FNP regarding development and update of comprehensive plan of care as evidenced by provider attestation and co-signature Inter-disciplinary care team collaboration (see longitudinal plan of care) Comprehensive medication review performed; medication list updated in electronic medical record  Diabetes: Uncontrolled; current treatment:GLIPIZIDE, ONGLYZA PAST TREATMENTS-JANUVIA (D/C'D DUE TO COST), METFORMIN (DUE TO GI) DISCONTINUED FARXIGA DUE TO RECURRENT UTI/YEAST INFECTIONS DESPITE CONTROLLED SUGARS BG IMPROVED ON ONGLYZA (ONLY DPP4 WE HAVE SAMPLES OF) If patient tolerated well, will pursue patient assistance with AZ&me RX assistance, onglyza Patient tolerating onglyza well--will call in [place] to see if covered Refuses injection (GLP1) Current glucose readings: fasting glucose: <180, post prandial glucose: 200s Denies hypoglycemic/hyperglycemic symptoms Discussed meal planning options and Plate method for healthy eating Avoid sugary drinks and desserts Incorporate balanced protein, non starchy veggies, 1 serving of carbohydrate with each meal Increase water intake Increase physical activity as able Current exercise: active job  Educated on current medications, patient on pill packaging with [facility]--discussed d/c of farxiga, trial of onglyza Recommended trial of onglyza Continue glipizide (with dinner), CONTINUE Onglyza (AM)--POTENTIALLY  SWITCH TO [place] PENDING INSURANCE  Patient Goals/Self-Care Activities Over the next 90 days, patient will:  - take medications as prescribed check glucose daily (fasting) or if symptomatic, document, and provide at future appointments  Follow Up Plan: Telephone follow up appointment with care management team member scheduled for: 2 weeks         The patient verbalized understanding of instructions, educational materials, and care plan provided today and declined offer to receive copy of patient instructions, educational materials, and care plan.   Telephone follow up appointment with care management team member scheduled for: 2WEEKS   Signature Regina Eck, PharmD, BCPS Clinical Pharmacist, [place]  II Phone [phone]

## 2021-01-09 NOTE — Telephone Encounter (Signed)
Her podiatrist would be the best person to provide this as I am sure his documentation supports it already. If not, she needs an appointment for this.

## 2021-01-09 NOTE — Telephone Encounter (Signed)
PATIENT REQUESTING DIABETIC SHOE PRESCRIPTION BE SENT TO [facility] NOW THAT HER INSURANCE HAS CHANGED TO [employer] MEDICARE  [place]!

## 2021-01-10 ENCOUNTER — Other Ambulatory Visit: Payer: Self-pay | Admitting: Family Medicine

## 2021-01-10 ENCOUNTER — Ambulatory Visit (INDEPENDENT_AMBULATORY_CARE_PROVIDER_SITE_OTHER): Payer: Medicare Other | Admitting: Family Medicine

## 2021-01-10 DIAGNOSIS — G6289 Other specified polyneuropathies: Secondary | ICD-10-CM

## 2021-01-10 MED ORDER — PREGABALIN 150 MG PO CAPS: 150.0000 mg | ORAL_CAPSULE | Freq: Two times a day (BID) | ORAL | 2 refills | Status: DC

## 2021-01-10 NOTE — Telephone Encounter (Signed)
She was referred to one in January; I assumed she was established. That is fine. Have her schedule an appointment with me.

## 2021-01-10 NOTE — Progress Notes (Signed)
Virtual Visit via Telephone Note  I connected with Cynthia Deleon on 01/10/21 at 3:35 PM by telephone and verified that I am speaking with the correct person using two identifiers. Cynthia Deleon is currently located in her vehicle in [place] and nobody is currently with her during this visit. The provider, Loman Brooklyn, FNP is located in their office at time of visit.  I discussed the limitations, risks, security and privacy concerns of performing an evaluation and management service by telephone and the availability of in person appointments. I also discussed with the patient that there may be a patient responsible charge related to this service. The patient expressed understanding and agreed to proceed.  Subjective: PCP: Loman Brooklyn, FNP  Chief Complaint  Patient presents with   Medical Management of Chronic Issues   Patient is in need of a refill of Lyrica for polyneuropathy. She does have a controlled substance agreement in place. Urine drug screen as expected. PDMP reviewed with no concerning findings.    ROS: Per HPI  Current Outpatient Medications:    ARIPiprazole (ABILIFY) 10 MG tablet, Take 10 mg by mouth daily., Disp: , Rfl:    atorvastatin (LIPITOR) 40 MG tablet, Take 1 tablet (40 mg total) by mouth daily., Disp: 90 tablet, Rfl: 1   BELSOMRA 20 MG TABS, Take 1 tablet by mouth at bedtime as needed., Disp: , Rfl:    cyclobenzaprine (FLEXERIL) 10 MG tablet, , Disp: , Rfl:    diazepam (VALIUM) 5 MG tablet, Take 5 mg by mouth in the morning and at bedtime. , Disp: , Rfl:    doxycycline (VIBRA-TABS) 100 MG tablet, Take 1 tablet (100 mg total) by mouth 2 (two) times daily., Disp: 20 tablet, Rfl: 0   fluconazole (DIFLUCAN) 150 MG tablet, Take 1 tablet (150 mg total) by mouth every three (3) days as needed., Disp: 3 tablet, Rfl: 0   fluticasone (FLONASE) 50 MCG/ACT nasal spray, Place 2 sprays into both nostrils daily., Disp: 16 g, Rfl: 5   gabapentin (NEURONTIN)  100 MG capsule, Take by mouth., Disp: , Rfl:    glipiZIDE (GLUCOTROL XL) 5 MG 24 hr tablet, TAKE (1) TABLET BY MOUTH ONCE DAILY WITH EVENING MEAL., Disp: 30 tablet, Rfl: 1   glucose blood (CONTOUR NEXT TEST) test strip, Test BS 4 times daily Dx E11.9, Disp: 400 strip, Rfl: 3   hydrocortisone cream 0.5 %, Apply 1 application topically 2 (two) times daily., Disp: 30 g, Rfl: 0   lamoTRIgine (LAMICTAL) 200 MG tablet, Take 200 mg by mouth at bedtime. For mood disorder., Disp: , Rfl:    lansoprazole (PREVACID) 30 MG capsule, Take 1 capsule (30 mg total) by mouth daily at 12 noon., Disp: 30 capsule, Rfl: 12   levocetirizine (XYZAL) 5 MG tablet, Take 1 tablet (5 mg total) by mouth every evening., Disp: 90 tablet, Rfl: 1   lisinopril (ZESTRIL) 5 MG tablet, TAKE (1) TABLET BY MOUTH ONCE DAILY., Disp: 90 tablet, Rfl: 1   meclizine (ANTIVERT) 25 MG tablet, Take 1 tablet (25 mg total) by mouth 2 (two) times daily as needed for dizziness., Disp: 30 tablet, Rfl: 2   montelukast (SINGULAIR) 10 MG tablet, TAKE (1) TABLET BY MOUTH AT BEDTIME., Disp: 90 tablet, Rfl: 0   mupirocin ointment (BACTROBAN) 2 %, Apply 1 application topically 2 (two) times daily., Disp: 22 g, Rfl: 1   nystatin (MYCOSTATIN) 100000 UNIT/ML suspension, Take 5 mLs (500,000 Units total) by mouth 4 (four) times daily.,  Disp: 473 mL, Rfl: 0   ondansetron (ZOFRAN ODT) 4 MG disintegrating tablet, Take 1 tablet (4 mg total) by mouth every 8 (eight) hours as needed for nausea or vomiting., Disp: 20 tablet, Rfl: 0   OneTouch Delica Lancets 99991111 MISC, Use to test blood sugar twice daily. DX: E11.9, Disp: 100 each, Rfl: 3   oxybutynin (DITROPAN-XL) 10 MG 24 hr tablet, TAKE (1) TABLET BY MOUTH AT BEDTIME., Disp: 30 tablet, Rfl: 1   oxyCODONE (ROXICODONE) 15 MG immediate release tablet, Take 7.5 mg by mouth every 6 (six) hours as needed. , Disp: , Rfl:    pregabalin (LYRICA) 150 MG capsule, TAKE (1) CAPSULE BY MOUTH TWICE DAILY., Disp: 60 capsule, Rfl: 2    PREMARIN 0.3 MG tablet, TAKE (1) TABLET BY MOUTH ONCE DAILY., Disp: 30 tablet, Rfl: 7   saxagliptin HCl (ONGLYZA) 5 MG TABS tablet, Take 5 mg by mouth daily., Disp: , Rfl:    sitaGLIPtin (JANUVIA) 100 MG tablet, Take 1 tablet (100 mg total) by mouth daily., Disp: 30 tablet, Rfl: 3   temazepam (RESTORIL) 15 MG capsule, Take 15 mg by mouth at bedtime., Disp: , Rfl:    traZODone (DESYREL) 100 MG tablet, Take 100 mg by mouth at bedtime., Disp: , Rfl:    umeclidinium-vilanterol (ANORO ELLIPTA) 62.5-25 MCG/INH AEPB, Inhale 1 puff into the lungs daily., Disp: 30 each, Rfl: 6   valACYclovir (VALTREX) 500 MG tablet, Take 1 tablet (500 mg total) by mouth daily as needed (shingles flare up)., Disp: 30 tablet, Rfl: 4  Allergies  Allergen Reactions   Chantix [Varenicline] Other (See Comments)    Altered mental status-Per patient was put on allergy list by Dr Lorriane Shire bu patient staes she is not allergic to this and is currently taking it.    Divalproex Sodium Other (See Comments)    Hallucinations  Other reaction(s): Confusion   Other Anaphylaxis   Pseudoeph-Hydrocodone-Gg Nausea Only   Sulfa Antibiotics Anaphylaxis and Nausea Only   Varenicline Tartrate Other (See Comments)    Other reaction(s): Confusion   Ambien [Zolpidem] Other (See Comments)    Causes sleep walking   Clavulanic Acid Diarrhea   Codeine Nausea And Vomiting   Elemental Sulfur Other (See Comments)    Swelling of tongue   Hydrocodone Nausea And Vomiting   Macrobid [Nitrofurantoin] Nausea And Vomiting    Dizziness, nausea, vomiting   Paxil [Paroxetine Hcl] Other (See Comments)    Causes ringing in the ears.    Amoxicillin Rash    Has patient had a PCN reaction causing immediate rash, facial/tongue/throat swelling, SOB or lightheadedness with hypotension: No Has patient had a PCN reaction causing severe rash involving mucus membranes or skin necrosis: No Has patient had a PCN reaction that required hospitalization: No Has  patient had a PCN reaction occurring within the last 10 years: Yes If all of the above answers are "NO", then may proceed with Cephalosporin use.    Farxiga [Dapagliflozin]     RECURRENT YEAST/UTI   Metformin And Related Diarrhea   Tape Rash   Past Medical History:  Diagnosis Date   Arthritis    hands and knees   Bipolar 1 disorder (HCC)    Borderline diabetic    Chronic back pain    Chronic neck pain    Complication of anesthesia    high anxiety-does not want to be alone   COPD (chronic obstructive pulmonary disease) ([place])    Current use of estrogen therapy 01/12/2014   Depression  Diabetes mellitus without complication ([place])    Diplopia    Family history of adverse reaction to anesthesia    sister "gas in lungs"   GERD (gastroesophageal reflux disease)    Headache    Hot flashes 12/15/2013   Hypertension    IBS (irritable bowel syndrome)    Incontinence    Kidney stone    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HCC)    Panic attacks    Peptic ulcer    Peripheral neuropathy    Rosacea    Shingles    Sleep apnea    uses a cpap-with oxygen    Observations/Objective: A&O  No respiratory distress or wheezing audible over the phone Mood, judgement, and thought processes all WNL   Assessment and Plan: 1. Other polyneuropathy - pregabalin (LYRICA) 150 MG capsule; Take 1 capsule (150 mg total) by mouth 2 (two) times daily.  Dispense: 60 capsule; Refill: 2   Follow Up Instructions: Return ASAP, for annual physical.  I discussed the assessment and treatment plan with the patient. The patient was provided an opportunity to ask questions and all were answered. The patient agreed with the plan and demonstrated an understanding of the instructions.   The patient was advised to call back or seek an in-person evaluation if the symptoms worsen or if the condition fails to improve as anticipated.  The above assessment and management plan was discussed with the patient. The patient  verbalized understanding of and has agreed to the management plan. Patient is aware to call the clinic if symptoms persist or worsen. Patient is aware when to return to the clinic for a follow-up visit. Patient educated on when it is appropriate to go to the emergency department.   Time call ended: 3:46 PM  I provided 11 minutes of non-face-to-face time during this encounter.  [name], MSN, APRN, FNP-C [facility] 01/10/21

## 2021-01-10 NOTE — Telephone Encounter (Signed)
Patient aware and states that she does not have a podiatrist.

## 2021-01-10 NOTE — Telephone Encounter (Signed)
Lmtcb    When patient calls place schedule her an appointment with PCP for diabetic shoes - 15 mins

## 2021-01-11 NOTE — Telephone Encounter (Signed)
Patient aware- had a visit

## 2021-01-12 ENCOUNTER — Encounter: Payer: 59 | Admitting: Family Medicine

## 2021-01-17 DIAGNOSIS — H04123 Dry eye syndrome of bilateral lacrimal glands: Secondary | ICD-10-CM | POA: Diagnosis not present

## 2021-01-24 ENCOUNTER — Ambulatory Visit (INDEPENDENT_AMBULATORY_CARE_PROVIDER_SITE_OTHER): Payer: Medicare Other | Admitting: Pharmacist

## 2021-01-24 DIAGNOSIS — E11649 Type 2 diabetes mellitus with hypoglycemia without coma: Secondary | ICD-10-CM

## 2021-01-25 ENCOUNTER — Encounter: Payer: Self-pay | Admitting: Family Medicine

## 2021-01-25 ENCOUNTER — Ambulatory Visit (INDEPENDENT_AMBULATORY_CARE_PROVIDER_SITE_OTHER): Payer: Medicare Other | Admitting: Family Medicine

## 2021-01-25 DIAGNOSIS — J209 Acute bronchitis, unspecified: Secondary | ICD-10-CM | POA: Diagnosis not present

## 2021-01-25 DIAGNOSIS — J440 Chronic obstructive pulmonary disease with acute lower respiratory infection: Secondary | ICD-10-CM | POA: Diagnosis not present

## 2021-01-25 DIAGNOSIS — J449 Chronic obstructive pulmonary disease, unspecified: Secondary | ICD-10-CM

## 2021-01-25 MED ORDER — METHYLPREDNISOLONE 4 MG PO TBPK: ORAL_TABLET | ORAL | 0 refills | Status: DC

## 2021-01-25 MED ORDER — ALBUTEROL SULFATE HFA 108 (90 BASE) MCG/ACT IN AERS: 2.0000 | INHALATION_SPRAY | Freq: Four times a day (QID) | RESPIRATORY_TRACT | 2 refills | Status: DC | PRN

## 2021-01-25 NOTE — Progress Notes (Signed)
Virtual Visit via Telephone Note  I connected with Cynthia Deleon on 01/25/21 at 10:37 AM by telephone and verified that I am speaking with the correct person using two identifiers. Cynthia Deleon is currently located at home and nobody is currently with her during this visit. The provider, Loman Brooklyn, FNP is located in their office at time of visit.  I discussed the limitations, risks, security and privacy concerns of performing an evaluation and management service by telephone and the availability of in person appointments. I also discussed with the patient that there may be a patient responsible charge related to this service. The patient expressed understanding and agreed to proceed.  Subjective: PCP: Loman Brooklyn, FNP  Chief Complaint  Patient presents with   Cough   Patient complains of cough, chest congestion, headache, and shortness of breath. Onset of symptoms was 1 month ago, gradually worsening since that time. She is drinking plenty of fluids. Previously seen on 12/27/2020 at which time flu and COVID tests were negative - she was treated as a viral URI. She was seen again on 01/02/2021 at which time she was diagnosed with a COPD exacerbation and treated with Doxycycline and Prednisone. Her CXR was normal at that time. Treatment to date: cough suppressants and Mucinex-DM . She has a history of COPD. She does not smoke.    ROS: Per HPI  Current Outpatient Medications:    ARIPiprazole (ABILIFY) 10 MG tablet, Take 10 mg by mouth daily., Disp: , Rfl:    atorvastatin (LIPITOR) 40 MG tablet, Take 1 tablet (40 mg total) by mouth daily., Disp: 90 tablet, Rfl: 1   BELSOMRA 20 MG TABS, Take 1 tablet by mouth at bedtime as needed., Disp: , Rfl:    cyclobenzaprine (FLEXERIL) 10 MG tablet, , Disp: , Rfl:    diazepam (VALIUM) 5 MG tablet, Take 5 mg by mouth in the morning and at bedtime. , Disp: , Rfl:    doxycycline (VIBRA-TABS) 100 MG tablet, Take 1 tablet (100 mg total) by  mouth 2 (two) times daily., Disp: 20 tablet, Rfl: 0   fluconazole (DIFLUCAN) 150 MG tablet, Take 1 tablet (150 mg total) by mouth every three (3) days as needed., Disp: 3 tablet, Rfl: 0   fluticasone (FLONASE) 50 MCG/ACT nasal spray, Place 2 sprays into both nostrils daily., Disp: 16 g, Rfl: 5   glipiZIDE (GLUCOTROL XL) 5 MG 24 hr tablet, TAKE (1) TABLET BY MOUTH ONCE DAILY WITH EVENING MEAL., Disp: 30 tablet, Rfl: 1   glucose blood (CONTOUR NEXT TEST) test strip, Test BS 4 times daily Dx E11.9, Disp: 400 strip, Rfl: 3   hydrocortisone cream 0.5 %, Apply 1 application topically 2 (two) times daily., Disp: 30 g, Rfl: 0   lamoTRIgine (LAMICTAL) 200 MG tablet, Take 200 mg by mouth at bedtime. For mood disorder., Disp: , Rfl:    lansoprazole (PREVACID) 30 MG capsule, Take 1 capsule (30 mg total) by mouth daily at 12 noon., Disp: 30 capsule, Rfl: 12   levocetirizine (XYZAL) 5 MG tablet, Take 1 tablet (5 mg total) by mouth every evening., Disp: 90 tablet, Rfl: 1   lisinopril (ZESTRIL) 5 MG tablet, TAKE (1) TABLET BY MOUTH ONCE DAILY., Disp: 90 tablet, Rfl: 1   meclizine (ANTIVERT) 25 MG tablet, Take 1 tablet (25 mg total) by mouth 2 (two) times daily as needed for dizziness., Disp: 30 tablet, Rfl: 2   montelukast (SINGULAIR) 10 MG tablet, TAKE (1) TABLET BY MOUTH AT BEDTIME.,  Disp: 90 tablet, Rfl: 0   mupirocin ointment (BACTROBAN) 2 %, Apply 1 application topically 2 (two) times daily., Disp: 22 g, Rfl: 1   nystatin (MYCOSTATIN) 100000 UNIT/ML suspension, Take 5 mLs (500,000 Units total) by mouth 4 (four) times daily., Disp: 473 mL, Rfl: 0   ondansetron (ZOFRAN ODT) 4 MG disintegrating tablet, Take 1 tablet (4 mg total) by mouth every 8 (eight) hours as needed for nausea or vomiting., Disp: 20 tablet, Rfl: 0   OneTouch Delica Lancets 99991111 MISC, Use to test blood sugar twice daily. DX: E11.9, Disp: 100 each, Rfl: 3   oxybutynin (DITROPAN-XL) 10 MG 24 hr tablet, TAKE (1) TABLET BY MOUTH AT BEDTIME., Disp: 30  tablet, Rfl: 1   oxyCODONE (ROXICODONE) 15 MG immediate release tablet, Take 7.5 mg by mouth every 6 (six) hours as needed. , Disp: , Rfl:    pregabalin (LYRICA) 150 MG capsule, Take 1 capsule (150 mg total) by mouth 2 (two) times daily., Disp: 60 capsule, Rfl: 2   PREMARIN 0.3 MG tablet, TAKE (1) TABLET BY MOUTH ONCE DAILY., Disp: 30 tablet, Rfl: 7   saxagliptin HCl (ONGLYZA) 5 MG TABS tablet, Take 5 mg by mouth daily., Disp: , Rfl:    sitaGLIPtin (JANUVIA) 100 MG tablet, Take 1 tablet (100 mg total) by mouth daily., Disp: 30 tablet, Rfl: 3   temazepam (RESTORIL) 15 MG capsule, Take 15 mg by mouth at bedtime., Disp: , Rfl:    traZODone (DESYREL) 100 MG tablet, Take 100 mg by mouth at bedtime., Disp: , Rfl:    umeclidinium-vilanterol (ANORO ELLIPTA) 62.5-25 MCG/INH AEPB, Inhale 1 puff into the lungs daily., Disp: 30 each, Rfl: 6   valACYclovir (VALTREX) 500 MG tablet, Take 1 tablet (500 mg total) by mouth daily as needed (shingles flare up)., Disp: 30 tablet, Rfl: 4  Allergies  Allergen Reactions   Chantix [Varenicline] Other (See Comments)    Altered mental status-Per patient was put on allergy list by Dr Lorriane Shire bu patient staes she is not allergic to this and is currently taking it.    Divalproex Sodium Other (See Comments)    Hallucinations  Other reaction(s): Confusion   Other Anaphylaxis   Pseudoeph-Hydrocodone-Gg Nausea Only   Sulfa Antibiotics Anaphylaxis and Nausea Only   Varenicline Tartrate Other (See Comments)    Other reaction(s): Confusion   Ambien [Zolpidem] Other (See Comments)    Causes sleep walking   Clavulanic Acid Diarrhea   Codeine Nausea And Vomiting   Elemental Sulfur Other (See Comments)    Swelling of tongue   Hydrocodone Nausea And Vomiting   Macrobid [Nitrofurantoin] Nausea And Vomiting    Dizziness, nausea, vomiting   Paxil [Paroxetine Hcl] Other (See Comments)    Causes ringing in the ears.    Amoxicillin Rash    Has patient had a PCN reaction  causing immediate rash, facial/tongue/throat swelling, SOB or lightheadedness with hypotension: No Has patient had a PCN reaction causing severe rash involving mucus membranes or skin necrosis: No Has patient had a PCN reaction that required hospitalization: No Has patient had a PCN reaction occurring within the last 10 years: Yes If all of the above answers are "NO", then may proceed with Cephalosporin use.    Farxiga [Dapagliflozin]     RECURRENT YEAST/UTI   Metformin And Related Diarrhea   Tape Rash   Past Medical History:  Diagnosis Date   Arthritis    hands and knees   Bipolar 1 disorder (HCC)    Borderline  diabetic    Chronic back pain    Chronic neck pain    Complication of anesthesia    high anxiety-does not want to be alone   COPD (chronic obstructive pulmonary disease) (HCC)    Current use of estrogen therapy 01/12/2014   Depression    Diabetes mellitus without complication ([place])    Diplopia    Family history of adverse reaction to anesthesia    sister "gas in lungs"   GERD (gastroesophageal reflux disease)    Headache    Hot flashes 12/15/2013   Hypertension    IBS (irritable bowel syndrome)    Incontinence    Kidney stone    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HCC)    Panic attacks    Peptic ulcer    Peripheral neuropathy    Rosacea    Shingles    Sleep apnea    uses a cpap-with oxygen    Observations/Objective: A&O  No respiratory distress or wheezing audible over the phone Mood, judgement, and thought processes all WNL  Assessment and Plan: 1. Acute bronchitis with COPD ([place]) Advised to switch Mucinex from DM to plain. Referred to pulmonology. Started an Albuterol inhaler and methylprednisolone.  - albuterol (VENTOLIN HFA) 108 (90 Base) MCG/ACT inhaler; Inhale 2 puffs into the lungs every 6 (six) hours as needed.  Dispense: 18 g; Refill: 2 - methylPREDNISolone (MEDROL DOSEPAK) 4 MG TBPK tablet; Use as directed.  Dispense: 21 each; Refill: 0  2. Chronic  obstructive pulmonary disease, unspecified COPD type ([place]) - Ambulatory referral to Pulmonology   Follow Up Instructions:  I discussed the assessment and treatment plan with the patient. The patient was provided an opportunity to ask questions and all were answered. The patient agreed with the plan and demonstrated an understanding of the instructions.   The patient was advised to call back or seek an in-person evaluation if the symptoms worsen or if the condition fails to improve as anticipated.  The above assessment and management plan was discussed with the patient. The patient verbalized understanding of and has agreed to the management plan. Patient is aware to call the clinic if symptoms persist or worsen. Patient is aware when to return to the clinic for a follow-up visit. Patient educated on when it is appropriate to go to the emergency department.   Time call ended: 10:48 AM  I provided 11 minutes of non-face-to-face time during this encounter.  [name], MSN, APRN, FNP-C [facility] 01/25/21

## 2021-01-31 ENCOUNTER — Other Ambulatory Visit: Payer: Self-pay | Admitting: Family Medicine

## 2021-01-31 DIAGNOSIS — N952 Postmenopausal atrophic vaginitis: Secondary | ICD-10-CM

## 2021-01-31 DIAGNOSIS — Z79899 Other long term (current) drug therapy: Secondary | ICD-10-CM

## 2021-02-01 DIAGNOSIS — M47816 Spondylosis without myelopathy or radiculopathy, lumbar region: Secondary | ICD-10-CM | POA: Diagnosis not present

## 2021-02-02 NOTE — Progress Notes (Signed)
Chronic Care Management Pharmacy Note  01/24/2021 Name:  Cynthia Deleon MRN:  [id number] DOB:  Jun 13, 1955  Summary: DIABETES  Recommendations/Changes made from today's visit: Diabetes: Uncontrolled; current treatment: GLIPIZIDE, ONGLYZA-->januvia PAST TREATMENTS-JANUVIA (D/C'D DUE TO COST), METFORMIN (DUE TO GI) DISCONTINUED FARXIGA DUE TO RECURRENT UTI/YEAST INFECTIONS DESPITE CONTROLLED SUGARS BG IMPROVED ON ONGLYZA (ONLY DPP4 WE HAVE SAMPLES OF)-->transitioned patient to Januvia as covered by insurance If patient tolerated well, will pursue patient assistance with AZ&me RX assistance, onglyza Refuses injection (GLP1), may consider Rybelsus if needed Current glucose readings: fasting glucose: <180, post prandial glucose: 200s Denies hypoglycemic/hyperglycemic symptoms Discussed meal planning options and Plate method for healthy eating Avoid sugary drinks and desserts Incorporate balanced protein, non starchy veggies, 1 serving of carbohydrate with each meal Increase water intake Increase physical activity as able Current exercise: active job  Educated on current medications, patient on pill packaging with [facility]--discussed JANUVIA Recommended trial of onglyza Continue glipizide (with dinner), START JANUVIA  Follow Up Plan: Telephone follow up appointment with care management team member scheduled for: 4 weeks  Subjective: Cynthia Deleon is an 65 y.o. year old female who is a primary patient of Loman Brooklyn, FNP.  The CCM team was consulted for assistance with disease management and care coordination needs.    Engaged with patient by telephone for follow up visit in response to provider referral for pharmacy case management and/or care coordination services.   Consent to Services:  The patient was given information about Chronic Care Management services, agreed to services, and gave verbal consent prior to initiation of services.  Please see initial visit  note for detailed documentation.   Patient Care Team: Loman Brooklyn, FNP as PCP - General (Family Medicine) Suella Broad, MD as Consulting Physician (Physical Medicine and Rehabilitation) Norma Fredrickson, MD as Consulting Physician (Psychiatry) Ilean China, RN as Registered Nurse Phillips Odor, MD as Consulting Physician (Neurology) Lavera Guise, [facility] (Pharmacist) Warren Danes, PA-C as Physician Assistant (Dermatology)  Objective:  Lab Results  Component Value Date   CREATININE 0.84 10/10/2020   CREATININE 0.72 06/08/2020   CREATININE 0.75 03/07/2020    Lab Results  Component Value Date   HGBA1C 6.4 10/10/2020   Last diabetic Eye exam:  Lab Results  Component Value Date/Time   HMDIABEYEEXA No Retinopathy 07/25/2020 12:00 AM    Last diabetic Foot exam: No results found for: HMDIABFOOTEX      Component Value Date/Time   CHOL 116 10/10/2020 0954   TRIG 72 10/10/2020 0954   HDL 40 10/10/2020 0954   CHOLHDL 2.9 10/10/2020 0954   CHOLHDL 4.1 11/16/2015 0715   VLDL 48 (H) 11/16/2015 0715   LDLCALC 61 10/10/2020 0954    Hepatic Function Latest Ref Rng & Units 10/10/2020 03/07/2020 12/02/2019  Total Protein 6.0 - 8.5 g/dL 6.7 6.3 6.3  Albumin 3.8 - 4.8 g/dL 5.1(H) 4.6 4.4  AST 0 - 40 IU/L '18 11 16  ' ALT 0 - 32 IU/L '13 7 10  ' Alk Phosphatase 44 - 121 IU/L 55 68 84  Total Bilirubin 0.0 - 1.2 mg/dL 0.7 0.4 0.3  Bilirubin, Direct 0.1 - 0.5 mg/dL - - -    Lab Results  Component Value Date/Time   TSH 1.220 07/04/2020 09:58 AM   TSH 1.060 03/07/2020 08:52 AM    CBC Latest Ref Rng & Units 10/10/2020 06/08/2020 12/02/2019  WBC 3.4 - 10.8 x10E3/uL 6.5 11.0(H) 7.8  Hemoglobin 11.1 - 15.9 g/dL 15.5 15.8 15.2  Hematocrit 34.0 - 46.6 % 44.5 45.7 43.6  Platelets 150 - 450 x10E3/uL 267 201 214    No results found for: VD25OH  Clinical ASCVD: No  The ASCVD Risk score Mikey Bussing DC Jr., et al., 2013) failed to calculate for the following reasons:   The valid total  cholesterol range is 130 to 320 mg/dL    Other: (CHADS2VASc if Afib, PHQ9 if depression, MMRC or CAT for COPD, ACT, DEXA)  Social History   Tobacco Use  Smoking Status Former   Packs/day: 0.50   Years: 35.00   Pack years: 17.50   Types: Cigarettes   Quit date: 05/11/2019   Years since quitting: 1.7  Smokeless Tobacco Never   BP Readings from Last 3 Encounters:  01/02/21 113/71  12/18/20 126/69  10/25/20 118/73   Pulse Readings from Last 3 Encounters:  01/02/21 90  12/18/20 78  10/25/20 89   Wt Readings from Last 3 Encounters:  01/02/21 178 lb (80.7 kg)  12/18/20 176 lb (79.8 kg)  10/25/20 177 lb 9.6 oz (80.6 kg)    Assessment: Review of patient past medical history, allergies, medications, health status, including review of consultants reports, laboratory and other test data, was performed as part of comprehensive evaluation and provision of chronic care management services.   SDOH:  (Social Determinants of Health) assessments and interventions performed:    CCM Care Plan  Allergies  Allergen Reactions   Chantix [Varenicline] Other (See Comments)    Altered mental status-Per patient was put on allergy list by Dr Lorriane Shire bu patient staes she is not allergic to this and is currently taking it.    Divalproex Sodium Other (See Comments)    Hallucinations  Other reaction(s): Confusion   Other Anaphylaxis   Pseudoeph-Hydrocodone-Gg Nausea Only   Sulfa Antibiotics Anaphylaxis and Nausea Only   Varenicline Tartrate Other (See Comments)    Other reaction(s): Confusion   Ambien [Zolpidem] Other (See Comments)    Causes sleep walking   Clavulanic Acid Diarrhea   Codeine Nausea And Vomiting   Elemental Sulfur Other (See Comments)    Swelling of tongue   Hydrocodone Nausea And Vomiting   Macrobid [Nitrofurantoin] Nausea And Vomiting    Dizziness, nausea, vomiting   Paxil [Paroxetine Hcl] Other (See Comments)    Causes ringing in the ears.    Amoxicillin Rash    Has  patient had a PCN reaction causing immediate rash, facial/tongue/throat swelling, SOB or lightheadedness with hypotension: No Has patient had a PCN reaction causing severe rash involving mucus membranes or skin necrosis: No Has patient had a PCN reaction that required hospitalization: No Has patient had a PCN reaction occurring within the last 10 years: Yes If all of the above answers are "NO", then may proceed with Cephalosporin use.    Wilder Glade [Dapagliflozin]     RECURRENT YEAST/UTI   Metformin And Related Diarrhea   Tape Rash    Medications Reviewed Today     Reviewed by Loman Brooklyn, FNP (Family Nurse Practitioner) on 01/25/21 at 1034  Med List Status: <None>   Medication Order Taking? Sig Documenting Provider Last Dose Status Informant  ARIPiprazole (ABILIFY) 10 MG tablet [id number] No Take 10 mg by mouth daily. [provider] Taking Active   atorvastatin (LIPITOR) 40 MG tablet [id number] No Take 1 tablet (40 mg total) by mouth daily. [name] F, FNP Taking Active   BELSOMRA 20 MG TABS [id number] No Take 1 tablet by mouth at bedtime as needed. [provider] Taking Active   cyclobenzaprine (FLEXERIL) 10 MG tablet [id number] No  [provider] Taking Active   diazepam (VALIUM) 5 MG tablet [id number] No Take 5 mg by mouth in the morning and at bedtime.  [provider] Taking Active   doxycycline (VIBRA-TABS) 100 MG tablet [id number]  Take 1 tablet (100 mg total) by mouth 2 (two) times daily. [name] A, FNP  Active   fluconazole (DIFLUCAN) 150 MG tablet [id number]  Take 1 tablet (150 mg total) by mouth every three (3) days as needed. [name] A, FNP  Active   fluticasone (FLONASE) 50 MCG/ACT nasal spray [id number] No Place 2 sprays into both nostrils daily. Loman Brooklyn, FNP Taking Active   glipiZIDE (GLUCOTROL XL) 5 MG 24 hr tablet [id number]  TAKE (1) TABLET BY MOUTH ONCE DAILY WITH EVENING MEAL. Loman Brooklyn, FNP  Active    glucose blood (CONTOUR NEXT TEST) test strip [id number] No Test BS 4 times daily Dx E11.9 Loman Brooklyn, FNP Taking Active   hydrocortisone cream 0.5 % [id number] No Apply 1 application topically 2 (two) times daily. Ivy Lynn, NP Taking Active   lamoTRIgine (LAMICTAL) 200 MG tablet [id number] No Take 200 mg by mouth at bedtime. For mood disorder. [provider] Taking Active   lansoprazole (PREVACID) 30 MG capsule [id number] No Take 1 capsule (30 mg total) by mouth daily at 12 noon. Loman Brooklyn, FNP Taking Active   levocetirizine (XYZAL) 5 MG tablet [id number] No Take 1 tablet (5 mg total) by mouth every evening. Loman Brooklyn, FNP Taking Active   lisinopril (ZESTRIL) 5 MG tablet [id number] No TAKE (1) TABLET BY MOUTH ONCE DAILY. Loman Brooklyn, FNP Taking Active   meclizine (ANTIVERT) 25 MG tablet [id number] No Take 1 tablet (25 mg total) by mouth 2 (two) times daily as needed for dizziness. Loman Brooklyn, FNP Taking Active   montelukast (SINGULAIR) 10 MG tablet [id number]  TAKE (1) TABLET BY MOUTH AT BEDTIME. Loman Brooklyn, FNP  Active   mupirocin ointment (BACTROBAN) 2 % [id number] No Apply 1 application topically 2 (two) times daily. Warren Danes, PA-C Taking Active   nystatin (MYCOSTATIN) 100000 UNIT/ML suspension [id number]  Take 5 mLs (500,000 Units total) by mouth 4 (four) times daily. [name] A, FNP  Active   ondansetron (ZOFRAN ODT) 4 MG disintegrating tablet [id number] No Take 1 tablet (4 mg total) by mouth every 8 (eight) hours as needed for nausea or vomiting. Loman Brooklyn, FNP Taking Active   [facility] Lancets 44L MISC [id number] No Use to test blood sugar twice daily. DX: E11.9 Loman Brooklyn, FNP Taking Active   oxybutynin (DITROPAN-XL) 10 MG 24 hr tablet [id number]  TAKE (1) TABLET BY MOUTH AT BEDTIME. Loman Brooklyn, FNP  Active   oxyCODONE (ROXICODONE) 15 MG immediate release tablet [id number] No Take 7.5 mg by mouth every 6 (six)  hours as needed.  [provider] Taking Active Self           Med Note Rulon Abide Apr 22, 2018  3:54 PM)    pregabalin (LYRICA) 150 MG capsule [id number]  Take 1 capsule (150 mg total) by mouth 2 (two) times daily. Loman Brooklyn, FNP  Active   PREMARIN 0.3 MG tablet [id number] No TAKE (1) TABLET BY MOUTH ONCE DAILY. Loman Brooklyn, FNP Taking Active   saxagliptin HCl (ONGLYZA) 5 MG TABS tablet [id number]  Take 5 mg by  mouth daily. [provider]  Active            Med Note Blanca Friend, Charday Capetillo D   Thu Dec 21, 2020  2:21 PM) samples  sitaGLIPtin (JANUVIA) 100 MG tablet [id number]  Take 1 tablet (100 mg total) by mouth daily. Loman Brooklyn, FNP  Active   temazepam (RESTORIL) 15 MG capsule [id number] No Take 15 mg by mouth at bedtime. [provider] Taking Active            Med Note Redmond Baseman Mar 20, 2020  2:30 PM) 03/20/20 - prescribed but not started yet  traZODone (DESYREL) 100 MG tablet [id number] No Take 100 mg by mouth at bedtime. [provider] Taking Active   umeclidinium-vilanterol (ANORO ELLIPTA) 62.5-25 MCG/INH AEPB [id number] No Inhale 1 puff into the lungs daily. Loman Brooklyn, FNP Taking Active   valACYclovir (VALTREX) 500 MG tablet [id number] No Take 1 tablet (500 mg total) by mouth daily as needed (shingles flare up). Gwenlyn Perking, FNP Taking Active   Med List Note Loman Brooklyn, [place] 12/04/19 1526): Not Rx'd by PCP: sertraline, valium, lamotrigine, belsomra, and oxycodone - [facility] 12/02/19.            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   Diabetes mellitus with stage 3 chronic kidney disease ([place]) 07/06/2018   Bilateral temporomandibular joint pain 01/21/2018   OSA (obstructive sleep apnea) 01/21/2018   Vertigo 01/21/2018   DDD (degenerative disc disease), cervical 07/17/2017   Degeneration of lumbar intervertebral disc 07/17/2017   OCD (obsessive compulsive disorder) 11/15/2015   Tobacco use disorder 11/15/2015   Current use of estrogen therapy 01/12/2014   PTSD (post-traumatic stress disorder) 08/03/2011   Bipolar I disorder, most recent episode mixed, severe with psychotic features ([place])     Immunization History  Administered Date(s) Administered   Influenza Split 03/26/2016, 02/17/2019   Influenza,inj,Quad PF,6+ Mos 02/18/2017, 02/17/2019   Influenza,inj,quad, With Preservative 04/03/2015, 04/10/2018, 02/17/2019   Influenza-Unspecified 03/10/2020   Moderna SARS-COV2 Booster Vaccination 06/07/2020   Moderna Sars-Covid-2 Vaccination 08/19/2019, 09/23/2019   Pneumococcal Conjugate-13 10/01/2017   Pneumococcal Polysaccharide-23 04/29/2007   Tdap 11/20/2017    Conditions to be addressed/monitored: DMII  Care Plan : PHARMD MEDICATION MANAGEMENT  Updates made by Lavera Guise, [place] since 02/02/2021 12:00 AM     Problem: DISEASE PROGRESSION PREVENTION      Long-Range Goal: T2DM   Recent Progress: Not on track  Priority: High  Note:   Current Barriers:  Unable to independently afford treatment regimen Unable to achieve control of T2DM   Pharmacist Clinical Goal(s):  Over the next 90 days, patient will verbalize ability to afford treatment regimen achieve control of T2DM as evidenced by IMPROVED GLYCEMIC CONTROL  through collaboration with PharmD and provider.   Interventions: 1:1 collaboration with Loman Brooklyn, FNP regarding development and update of comprehensive plan of care as evidenced by provider attestation and  co-signature Inter-disciplinary care team collaboration (see longitudinal plan of care) Comprehensive medication review performed; medication list updated in electronic  medical record  Diabetes: Uncontrolled; current treatment: GLIPIZIDE, ONGLYZA-->januvia PAST TREATMENTS-JANUVIA (D/C'D DUE TO COST), METFORMIN (DUE TO GI) DISCONTINUED FARXIGA DUE TO RECURRENT UTI/YEAST INFECTIONS DESPITE CONTROLLED SUGARS BG IMPROVED ON ONGLYZA (ONLY DPP4 WE HAVE SAMPLES OF)-->transitioned patient to Januvia as covered by insurance If patient tolerated well, will pursue patient assistance with AZ&me RX assistance, onglyza Refuses injection (GLP1), may consider Rybelsus if needed Current glucose readings: fasting glucose: <180, post prandial glucose: 200s Denies hypoglycemic/hyperglycemic symptoms Discussed meal planning options and Plate method for healthy eating Avoid sugary drinks and desserts Incorporate balanced protein, non starchy veggies, 1 serving of carbohydrate with each meal Increase water intake Increase physical activity as able Current exercise: active job  Educated on current medications, patient on pill packaging with [facility]--discussed JANUVIA Recommended trial of onglyza Continue glipizide (with dinner), START JANUVIA  Patient Goals/Self-Care Activities Over the next 90 days, patient will:  - take medications as prescribed check glucose daily (fasting) or if symptomatic, document, and provide at future appointments  Follow Up Plan: Telephone follow up appointment with care management team member scheduled for: 4 weeks      Medication Assistance: None required.  Patient affirms current coverage meets needs.  Patient's preferred pharmacy is:  [facility] [address] [place] [address] Phone: [phone] Fax: [phone]  Uses pill box? Yes--USES PILL PACKAGING-[facility] Pt endorses 100%  compliance  Follow Up:  Patient agrees to Care Plan and Follow-up.  Plan: Telephone follow up appointment with care management team member scheduled for:  03/06/21   Regina Eck, PharmD, BCPS Clinical Pharmacist, [place]  II Phone [phone]

## 2021-02-02 NOTE — Patient Instructions (Signed)
Visit Information  PATIENT GOALS:  Goals Addressed               This Visit's Progress     Patient Stated     T2DM-PharmD (pt-stated)        Current Barriers:  Unable to independently afford treatment regimen Unable to achieve control of T2DM   Pharmacist Clinical Goal(s):  Over the next 90 days, patient will verbalize ability to afford treatment regimen achieve control of T2DM as evidenced by IMPROVED GLYCEMIC CONTROL through collaboration with PharmD and provider.   Interventions: 1:1 collaboration with Loman Brooklyn, FNP regarding development and update of comprehensive plan of care as evidenced by provider attestation and co-signature Inter-disciplinary care team collaboration (see longitudinal plan of care) Comprehensive medication review performed; medication list updated in electronic medical record  Diabetes: Uncontrolled; current treatment: GLIPIZIDE, ONGLYZA-->januvia PAST TREATMENTS-JANUVIA (D/C'D DUE TO COST), METFORMIN (DUE TO GI) DISCONTINUED FARXIGA DUE TO RECURRENT UTI/YEAST INFECTIONS DESPITE CONTROLLED SUGARS BG IMPROVED ON ONGLYZA (ONLY DPP4 WE HAVE SAMPLES OF)-->transitioned patient to Januvia as covered by insurance If patient tolerated well, will pursue patient assistance with AZ&me RX assistance, onglyza Refuses injection (GLP1), may consider Rybelsus if needed Current glucose readings: fasting glucose: <180, post prandial glucose: 200s Denies hypoglycemic/hyperglycemic symptoms Discussed meal planning options and Plate method for healthy eating Avoid sugary drinks and desserts Incorporate balanced protein, non starchy veggies, 1 serving of carbohydrate with each meal Increase water intake Increase physical activity as able Current exercise: active job  Educated on current medications, patient on pill packaging with [facility]--discussed JANUVIA Recommended trial of onglyza Continue glipizide (with dinner), START JANUVIA  Patient  Goals/Self-Care Activities Over the next 90 days, patient will:  - take medications as prescribed check glucose daily (fasting) or if symptomatic, document, and provide at future appointments  Follow Up Plan: Telephone follow up appointment with care management team member scheduled for: 4 weeks         The patient verbalized understanding of instructions, educational materials, and care plan provided today and declined offer to receive copy of patient instructions, educational materials, and care plan.   Telephone follow up appointment with care management team member scheduled for:03/06/21  Signature Regina Eck, PharmD, BCPS Clinical Pharmacist, [place]  II Phone [phone]

## 2021-02-14 ENCOUNTER — Encounter (HOSPITAL_BASED_OUTPATIENT_CLINIC_OR_DEPARTMENT_OTHER): Payer: Self-pay

## 2021-02-14 DIAGNOSIS — G4733 Obstructive sleep apnea (adult) (pediatric): Secondary | ICD-10-CM

## 2021-02-20 ENCOUNTER — Encounter: Payer: Medicare Other | Admitting: Family Medicine

## 2021-02-20 ENCOUNTER — Encounter: Payer: Self-pay | Admitting: Pulmonary Disease

## 2021-02-20 ENCOUNTER — Other Ambulatory Visit: Payer: Self-pay

## 2021-02-20 ENCOUNTER — Ambulatory Visit: Payer: Medicare Other | Admitting: Pulmonary Disease

## 2021-02-20 VITALS — BP 110/60 | HR 84 | Temp 98.2°F | Ht 69.0 in | Wt 178.0 lb

## 2021-02-20 DIAGNOSIS — Z87891 Personal history of nicotine dependence: Secondary | ICD-10-CM

## 2021-02-20 DIAGNOSIS — J4550 Severe persistent asthma, uncomplicated: Secondary | ICD-10-CM | POA: Diagnosis not present

## 2021-02-20 DIAGNOSIS — J3089 Other allergic rhinitis: Secondary | ICD-10-CM

## 2021-02-20 DIAGNOSIS — G4733 Obstructive sleep apnea (adult) (pediatric): Secondary | ICD-10-CM

## 2021-02-20 MED ORDER — AZITHROMYCIN 250 MG PO TABS: ORAL_TABLET | ORAL | 0 refills | Status: AC

## 2021-02-20 MED ORDER — BUDESONIDE-FORMOTEROL FUMARATE 160-4.5 MCG/ACT IN AERO: 2.0000 | INHALATION_SPRAY | Freq: Two times a day (BID) | RESPIRATORY_TRACT | 6 refills | Status: DC

## 2021-02-20 MED ORDER — FLUCONAZOLE 100 MG PO TABS: 100.0000 mg | ORAL_TABLET | Freq: Every day | ORAL | 0 refills | Status: DC

## 2021-02-20 MED ORDER — AZELASTINE HCL 0.1 % NA SOLN: 1.0000 | Freq: Two times a day (BID) | NASAL | 12 refills | Status: DC

## 2021-02-20 NOTE — Patient Instructions (Signed)
Stop anoro  Start symbicort two puffs in the morning and two puffs in the evening, and rinse your mouth after each use.  Will arrange for a spacer device to use with symbicort and albuterol.  Zithromax 250 mg pill >> 2 pills on day 1, then 1 pill daily for next 4 days.  Azelastine nasal spray, 1 spray in each nostril in the morning and 1 spray in each nostril in the evening  Flonase 1 spray in each nostril daily  Follow up in 4 weeks.

## 2021-02-20 NOTE — Progress Notes (Signed)
[facility]  Chief Complaint  Patient presents with   Consult    SOB. Has a cpap but hasnt used it for a year.     Constitutional:  BP 110/60 (BP Location: Left Arm, Patient Position: Sitting)   Pulse 84   Temp 98.2 F (36.8 C) (Oral)   Ht '5\' 9"'$  (1.753 m)   Wt 178 lb (80.7 kg)   SpO2 100%   BMI 26.29 kg/m   Past Medical History:  OA, Bipolar, DM, Back pain, Depression, Diplopia, GERD, Headache, HTN, IBS, Nephrolithiasis, Multiple personality disorder, Panic attacks, PUD, Neuropathy, Shingles, Rosacea  Past Surgical History:  She  has a past surgical history that includes Tonsillectomy; Foot arthrodesis (2000); Bunionectomy with hammertoe reconstruction (Right, 12/10/2012); Rectal surgery; Liposuction (03/2018); Colonoscopy with propofol (N/A, 04/27/2020); biopsy (04/27/2020); and Total abdominal hysterectomy.  Brief Summary:  Cynthia Deleon is a 65 y.o. female former smoker with dyspnea and obstructive sleep apnea.       Subjective:   She used to smoke cigarettes.  She developed a cough with chest congestion several weeks ago.  Symptoms have lingered on.  She was told she had COPD.  Her PFT from 2020 was more consistent with asthma.  Her previous CT chest showed mild emphysema.  She gets intermittent wheezing.  Sputum is clear to yellow.  No hemoptysis.  She gets sinus drainage and cough when she lays flat at night.  She has sleep apnea.  Hasn't been able to use CPAP as regularly due to cough.  She has sleep study scheduled at [facility] for later this month.  She used her sisters daliresp, and this helped.  She had trouble tolerating arnuity due to thrush and throat irritation.  Chest xray from July 2022 showed clear lungs.  Physical Exam:   Appearance - well kempt   ENMT - no sinus tenderness, no oral exudate, no LAN, Mallampati 3 airway, no stridor  Respiratory - equal breath sounds bilaterally, no wheezing or rales  CV - s1s2  regular rate and rhythm, no murmurs  Ext - no clubbing, no edema  Skin - no rashes  Psych - normal mood and affect   Pulmonary testing:  PFT 05/13/19 >> FEV1 2.06 (69%), FEV1% 78, TLC 4.56 (78%), DLCO 97%, +BD  Chest Imaging:  LDCT chest 04/20/19 >> mild centrilobular emphysema, atherosclerosis, 3 mm nodule RML, fatty liver  Sleep Tests:    Cardiac Tests:  Echo 02/17/18 >> EF 55 to 60%, grade 1 DD  Social History:  She  reports that she quit smoking about 21 months ago. Her smoking use included cigarettes. She has a 17.50 pack-year smoking history. She has never used smokeless tobacco. She reports that she does not drink alcohol and does not use drugs.  Family History:  Her family history includes Alcohol abuse in her brother, brother, brother, and brother; COPD in her brother, father, and sister; Diabetes in her maternal grandfather, maternal grandmother, mother, and sister; Hyperlipidemia in her sister; Other in her brother, mother, and sister.     Assessment/Plan:   Asthmatic bronchitis. - will have her stop anoro - will start symbicort 160 two puffs bid - prn albuterol - will arrange for spacer device to use with symbicort and albuterol - will give her course of zitrhomax - might need to add singulair if symptoms persist  Perennial allergic rhinitis. - continue flonase - add azelastine - might need further allergy testing if symptoms persist  History of tobacco  abuse. - will schedule follow up low dose CT chest for lung cancer screening  Obstructive sleep apnea. - will review her sleep study results at next visit   Time Spent Involved in Patient Care on Day of Examination:  47 minutes  Follow up:   Patient Instructions  Stop anoro  Start symbicort two puffs in the morning and two puffs in the evening, and rinse your mouth after each use.  Will arrange for a spacer device to use with symbicort and albuterol.  Zithromax 250 mg pill >> 2 pills on day 1,  then 1 pill daily for next 4 days.  Azelastine nasal spray, 1 spray in each nostril in the morning and 1 spray in each nostril in the evening  Flonase 1 spray in each nostril daily  Follow up in 4 weeks.  Medication List:   Allergies as of 02/20/2021       Reactions   Chantix [varenicline] Other (See Comments)   Altered mental status-Per patient was put on allergy list by Dr Lorriane Shire bu patient staes she is not allergic to this and is currently taking it.    Divalproex Sodium Other (See Comments)   Hallucinations  Other reaction(s): Confusion   Other Anaphylaxis   Pseudoeph-hydrocodone-gg Nausea Only   Sulfa Antibiotics Anaphylaxis, Nausea Only   Varenicline Tartrate Other (See Comments)   Other reaction(s): Confusion   Ambien [zolpidem] Other (See Comments)   Causes sleep walking   Clavulanic Acid Diarrhea   Codeine Nausea And Vomiting   Elemental Sulfur Other (See Comments)   Swelling of tongue   Hydrocodone Nausea And Vomiting   Macrobid [nitrofurantoin] Nausea And Vomiting   Dizziness, nausea, vomiting   Paxil [paroxetine Hcl] Other (See Comments)   Causes ringing in the ears.    Amoxicillin Rash   Has patient had a PCN reaction causing immediate rash, facial/tongue/throat swelling, SOB or lightheadedness with hypotension: No Has patient had a PCN reaction causing severe rash involving mucus membranes or skin necrosis: No Has patient had a PCN reaction that required hospitalization: No Has patient had a PCN reaction occurring within the last 10 years: Yes If all of the above answers are "NO", then may proceed with Cephalosporin use.   Farxiga [dapagliflozin]    RECURRENT YEAST/UTI   Metformin And Related Diarrhea   Tape Rash        Medication List        Accurate as of February 20, 2021 12:56 PM. If you have any questions, ask your nurse or doctor.          STOP taking these medications    Anoro Ellipta 62.5-25 MCG/INH Aepb Generic drug:  umeclidinium-vilanterol Stopped by: Chesley Mires, MD   methylPREDNISolone 4 MG Tbpk tablet Commonly known as: MEDROL DOSEPAK Stopped by: Chesley Mires, MD       TAKE these medications    albuterol 108 (90 Base) MCG/ACT inhaler Commonly known as: VENTOLIN HFA Inhale 2 puffs into the lungs every 6 (six) hours as needed.   ARIPiprazole 10 MG tablet Commonly known as: ABILIFY Take 10 mg by mouth daily.   atorvastatin 40 MG tablet Commonly known as: LIPITOR Take 1 tablet (40 mg total) by mouth daily.   azelastine 0.1 % nasal spray Commonly known as: ASTELIN Place 1 spray into both nostrils 2 (two) times daily. Use in each nostril as directed Started by: Chesley Mires, MD   azithromycin 250 MG tablet Commonly known as: ZITHROMAX Take 2 tablets (500 mg total)  by mouth daily for 1 day, THEN 1 tablet (250 mg total) daily for 4 days. Start taking on: February 20, 2021 Started by: Chesley Mires, MD   Belsomra 20 MG Tabs Generic drug: Suvorexant Take 1 tablet by mouth at bedtime as needed.   budesonide-formoterol 160-4.5 MCG/ACT inhaler Commonly known as: Symbicort Inhale 2 puffs into the lungs in the morning and at bedtime. Started by: Chesley Mires, MD   Contour Next Test test strip Generic drug: glucose blood Test BS 4 times daily Dx E11.9   cyclobenzaprine 10 MG tablet Commonly known as: FLEXERIL   diazepam 5 MG tablet Commonly known as: VALIUM Take 5 mg by mouth in the morning and at bedtime.   fluconazole 100 MG tablet Commonly known as: Diflucan Take 1 tablet (100 mg total) by mouth daily. What changed:  medication strength how much to take when to take this reasons to take this Changed by: Chesley Mires, MD   fluticasone 50 MCG/ACT nasal spray Commonly known as: FLONASE Place 2 sprays into both nostrils daily.   glipiZIDE 5 MG 24 hr tablet Commonly known as: GLUCOTROL XL TAKE (1) TABLET BY MOUTH ONCE DAILY WITH EVENING MEAL.   hydrocortisone cream 0.5  % Apply 1 application topically 2 (two) times daily.   lamoTRIgine 200 MG tablet Commonly known as: LAMICTAL Take 200 mg by mouth at bedtime. For mood disorder.   lansoprazole 30 MG capsule Commonly known as: PREVACID Take 1 capsule (30 mg total) by mouth daily at 12 noon.   levocetirizine 5 MG tablet Commonly known as: XYZAL Take 1 tablet (5 mg total) by mouth every evening.   lisinopril 5 MG tablet Commonly known as: ZESTRIL TAKE (1) TABLET BY MOUTH ONCE DAILY.   meclizine 25 MG tablet Commonly known as: ANTIVERT Take 1 tablet (25 mg total) by mouth 2 (two) times daily as needed for dizziness.   montelukast 10 MG tablet Commonly known as: SINGULAIR TAKE (1) TABLET BY MOUTH AT BEDTIME.   mupirocin ointment 2 % Commonly known as: BACTROBAN Apply 1 application topically 2 (two) times daily.   nystatin 100000 UNIT/ML suspension Commonly known as: MYCOSTATIN Take 5 mLs (500,000 Units total) by mouth 4 (four) times daily.   ondansetron 4 MG disintegrating tablet Commonly known as: Zofran ODT Take 1 tablet (4 mg total) by mouth every 8 (eight) hours as needed for nausea or vomiting.   OneTouch Delica Lancets 99991111 Misc Use to test blood sugar twice daily. DX: E11.9   oxybutynin 10 MG 24 hr tablet Commonly known as: DITROPAN-XL TAKE (1) TABLET BY MOUTH AT BEDTIME.   oxyCODONE 15 MG immediate release tablet Commonly known as: ROXICODONE Take 7.5 mg by mouth every 6 (six) hours as needed.   pregabalin 150 MG capsule Commonly known as: LYRICA Take 1 capsule (150 mg total) by mouth 2 (two) times daily.   Premarin 0.3 MG tablet Generic drug: estrogens (conjugated) TAKE (1) TABLET BY MOUTH ONCE DAILY.   saxagliptin HCl 5 MG Tabs tablet Commonly known as: ONGLYZA Take 5 mg by mouth daily.   sitaGLIPtin 100 MG tablet Commonly known as: Januvia Take 1 tablet (100 mg total) by mouth daily.   temazepam 15 MG capsule Commonly known as: RESTORIL Take 15 mg by mouth at  bedtime.   traZODone 100 MG tablet Commonly known as: DESYREL Take 100 mg by mouth at bedtime.   valACYclovir 500 MG tablet Commonly known as: VALTREX Take 1 tablet (500 mg total) by mouth daily as needed (  shingles flare up).        Signature:  Chesley Mires, MD [place] Pager - [phone] 02/20/2021, 12:56 PM

## 2021-02-26 ENCOUNTER — Other Ambulatory Visit: Payer: Self-pay | Admitting: Family Medicine

## 2021-02-28 ENCOUNTER — Encounter: Payer: 59 | Admitting: Neurology

## 2021-02-28 ENCOUNTER — Telehealth: Payer: Self-pay | Admitting: Pulmonary Disease

## 2021-02-28 MED ORDER — BENZONATATE 200 MG PO CAPS: 200.0000 mg | ORAL_CAPSULE | Freq: Three times a day (TID) | ORAL | 1 refills | Status: DC | PRN

## 2021-02-28 NOTE — Telephone Encounter (Signed)
Send script for tessalon 200 mg tid prn cough, #30 with 1 refill.

## 2021-02-28 NOTE — Telephone Encounter (Signed)
Primary Pulmonologist: Dr. Halford Chessman  Last office visit and with whom: Dr. Halford Chessman 02/21/2020 What do we see them for (pulmonary problems): consult/cough/sleep Last OV assessment/plan: See Below   Was appointment offered to patient (explain)?  No f/u scheduled 03/15/21   Reason for call: Cough.  Patient saw Dr. Halford Chessman last week and reported to him that she has a persistent dry cough. Called this morning and said that the z pak he prescribed last week has not improved her cough at all. It kept her up all night last night.  Dry cough. Persistent. No fevers. Wheezing is about the same as she reported during her visit last week. Cough is so bad that it takes her breath away sometimes. Patient says she gave Dr. Halford Chessman the name of a medication that she would like to try but can't remember the name of it. Is hopeful that he can recall the name of the med. Says she has tried cough syrup in addition to the z-pak and sees no improvement.   Dr. Halford Chessman please advise    Allergies  Allergen Reactions   Chantix [Varenicline] Other (See Comments)    Altered mental status-Per patient was put on allergy list by Dr Lorriane Shire bu patient staes she is not allergic to this and is currently taking it.    Divalproex Sodium Other (See Comments)    Hallucinations  Other reaction(s): Confusion   Other Anaphylaxis   Pseudoeph-Hydrocodone-Gg Nausea Only   Sulfa Antibiotics Anaphylaxis and Nausea Only   Varenicline Tartrate Other (See Comments)    Other reaction(s): Confusion   Ambien [Zolpidem] Other (See Comments)    Causes sleep walking   Clavulanic Acid Diarrhea   Codeine Nausea And Vomiting   Elemental Sulfur Other (See Comments)    Swelling of tongue   Hydrocodone Nausea And Vomiting   Macrobid [Nitrofurantoin] Nausea And Vomiting    Dizziness, nausea, vomiting   Paxil [Paroxetine Hcl] Other (See Comments)    Causes ringing in the ears.    Amoxicillin Rash    Has patient had a PCN reaction causing immediate rash,  facial/tongue/throat swelling, SOB or lightheadedness with hypotension: No Has patient had a PCN reaction causing severe rash involving mucus membranes or skin necrosis: No Has patient had a PCN reaction that required hospitalization: No Has patient had a PCN reaction occurring within the last 10 years: Yes If all of the above answers are "NO", then may proceed with Cephalosporin use.    Farxiga [Dapagliflozin]     RECURRENT YEAST/UTI   Metformin And Related Diarrhea   Tape Rash    Immunization History  Administered Date(s) Administered   Influenza Split 03/26/2016, 02/17/2019   Influenza,inj,Quad PF,6+ Mos 02/18/2017, 02/17/2019   Influenza,inj,quad, With Preservative 04/03/2015, 04/10/2018, 02/17/2019   Influenza-Unspecified 03/10/2020   Moderna SARS-COV2 Booster Vaccination 06/07/2020   Moderna Sars-Covid-2 Vaccination 08/19/2019, 09/23/2019   Pneumococcal Conjugate-13 10/01/2017   Pneumococcal Polysaccharide-23 04/29/2007   Tdap 11/20/2017      Assessment/Plan:    Asthmatic bronchitis. - will have her stop anoro - will start symbicort 160 two puffs bid - prn albuterol - will arrange for spacer device to use with symbicort and albuterol - will give her course of zitrhomax - might need to add singulair if symptoms persist   Perennial allergic rhinitis. - continue flonase - add azelastine - might need further allergy testing if symptoms persist   History of tobacco abuse. - will schedule follow up low dose CT chest for lung cancer screening  Obstructive sleep apnea. - will review her sleep study results at next visit     Time Spent Involved in Patient Care on Day of Examination:  47 minutes   Follow up:    Patient Instructions  Stop anoro   Start symbicort two puffs in the morning and two puffs in the evening, and rinse your mouth after each use.  Will arrange for a spacer device to use with symbicort and albuterol.   Zithromax 250 mg pill >> 2 pills on day  1, then 1 pill daily for next 4 days.   Azelastine nasal spray, 1 spray in each nostril in the morning and 1 spray in each nostril in the evening   Flonase 1 spray in each nostril daily   Follow up in 4 weeks.

## 2021-02-28 NOTE — Telephone Encounter (Signed)
Called and spoke with patient to let her know recs from Dr. Halford Chessman. She verified pharmacy. RX has been sent. Nothing further needed at this time.

## 2021-03-06 ENCOUNTER — Ambulatory Visit (INDEPENDENT_AMBULATORY_CARE_PROVIDER_SITE_OTHER): Payer: Medicare Other | Admitting: Pharmacist

## 2021-03-06 DIAGNOSIS — E11649 Type 2 diabetes mellitus with hypoglycemia without coma: Secondary | ICD-10-CM

## 2021-03-07 ENCOUNTER — Telehealth: Payer: Self-pay | Admitting: Pharmacist

## 2021-03-07 NOTE — Progress Notes (Signed)
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) DISCONTINUED FARXIGA DUE TO RECURRENT UTI/YEAST INFECTIONS DESPITE CONTROLLED SUGARS CONTINUE JANUVIA AND GLIPIZIDE  RECHECK A1C TOMORROW Current glucose readings: fasting glucose: <180, post prandial glucose: 200s PATIENT INTERESTED IN LIBRE--WILL ATTEMPT TO GET VIA [place] Denies hypoglycemic/hyperglycemic symptoms Discussed meal planning options and Plate method for healthy eating Avoid sugary drinks and desserts Incorporate balanced protein, non starchy veggies, 1 serving of carbohydrate with each meal Increase water intake Increase physical activity as able Current exercise: active job  Educated on current medications, patient on pill packaging with [facility]--discussed JANUVIA Recommended A1C TOMORROW, LIBRE PAPERWORK Continue glipizide (with dinner), CONTINUE JANUVIA  Follow Up Plan: Telephone follow up appointment with care management team member scheduled for: 03/08/21  Subjective: Cynthia Deleon is an 65 y.o. year old female who is a primary patient of Loman Brooklyn, FNP.  The CCM team was consulted for assistance with disease management and care coordination needs.    Engaged with patient by telephone for follow up visit in response to provider referral for pharmacy case management and/or care coordination services.   Consent to Services:  The patient was given information about Chronic Care Management services, agreed to services, and gave verbal consent prior to initiation of services.  Please see initial visit note for detailed documentation.   Patient Care Team: Loman Brooklyn, FNP as PCP - General (Family Medicine) Suella Broad, MD as Consulting Physician (Physical Medicine  and Rehabilitation) Norma Fredrickson, MD as Consulting Physician (Psychiatry) Ilean China, RN as Registered Nurse Phillips Odor, MD as Consulting Physician (Neurology) Lavera Guise, [facility] (Pharmacist) Warren Danes, PA-C as Physician Assistant (Dermatology)  Objective:  Lab Results  Component Value Date   CREATININE 0.84 10/10/2020   CREATININE 0.72 06/08/2020   CREATININE 0.75 03/07/2020    Lab Results  Component Value Date   HGBA1C 6.4 10/10/2020   Last diabetic Eye exam:  Lab Results  Component Value Date/Time   HMDIABEYEEXA No Retinopathy 07/25/2020 12:00 AM    Last diabetic Foot exam: No results found for: HMDIABFOOTEX      Component Value Date/Time   CHOL 116 10/10/2020 0954   TRIG 72 10/10/2020 0954   HDL 40 10/10/2020 0954   CHOLHDL 2.9 10/10/2020 0954   CHOLHDL 4.1 11/16/2015 0715   VLDL 48 (H) 11/16/2015 0715   LDLCALC 61 10/10/2020 0954    Hepatic Function Latest Ref Rng & Units 10/10/2020 03/07/2020 12/02/2019  Total Protein 6.0 - 8.5 g/dL 6.7 6.3 6.3  Albumin 3.8 - 4.8 g/dL 5.1(H) 4.6 4.4  AST 0 - 40 IU/L '18 11 16  ' ALT 0 - 32 IU/L '13 7 10  ' Alk Phosphatase 44 - 121 IU/L 55 68 84  Total Bilirubin 0.0 - 1.2 mg/dL 0.7 0.4 0.3  Bilirubin, Direct 0.1 - 0.5 mg/dL - - -    Lab Results  Component Value Date/Time   TSH 1.220 07/04/2020 09:58 AM   TSH 1.060 03/07/2020 08:52 AM    CBC Latest Ref Rng & Units 10/10/2020 06/08/2020 12/02/2019  WBC 3.4 - 10.8 x10E3/uL 6.5 11.0(H) 7.8  Hemoglobin 11.1 - 15.9 g/dL 15.5 15.8 15.2  Hematocrit 34.0 - 46.6 % 44.5 45.7 43.6  Platelets 150 - 450 x10E3/uL 267 201 214    No results found for: VD25OH  Clinical ASCVD: No  The ASCVD Risk score (Arnett DK, et al., 2019) failed to calculate for the following reasons:   The valid total cholesterol range is 130 to 320 mg/dL    Other: (CHADS2VASc if Afib, PHQ9 if depression, MMRC or CAT for COPD, ACT, DEXA)  Social History   Tobacco Use  Smoking Status Former    Packs/day: 0.50   Years: 35.00   Pack years: 17.50   Types: Cigarettes   Quit date: 05/11/2019   Years since quitting: 1.8  Smokeless Tobacco Never   BP Readings from Last 3 Encounters:  02/20/21 110/60  01/02/21 113/71  12/18/20 126/69   Pulse Readings from Last 3 Encounters:  02/20/21 84  01/02/21 90  12/18/20 78   Wt Readings from Last 3 Encounters:  02/20/21 178 lb (80.7 kg)  01/02/21 178 lb (80.7 kg)  12/18/20 176 lb (79.8 kg)    Assessment: Review of patient past medical history, allergies, medications, health status, including review of consultants reports, laboratory and other test data, was performed as part of comprehensive evaluation and provision of chronic care management services.   SDOH:  (Social Determinants of Health) assessments and interventions performed:    CCM Care Plan  Allergies  Allergen Reactions   Chantix [Varenicline] Other (See Comments)    Altered mental status-Per patient was put on allergy list by Dr Lorriane Shire bu patient staes she is not allergic to this and is currently taking it.    Divalproex Sodium Other (See Comments)    Hallucinations  Other reaction(s): Confusion   Other Anaphylaxis   Pseudoeph-Hydrocodone-Gg Nausea Only   Sulfa Antibiotics Anaphylaxis and Nausea Only   Varenicline Tartrate Other (See Comments)    Other reaction(s): Confusion   Ambien [Zolpidem] Other (See Comments)    Causes sleep walking   Clavulanic Acid Diarrhea   Codeine Nausea And Vomiting   Elemental Sulfur Other (See Comments)    Swelling of tongue   Hydrocodone Nausea And Vomiting   Macrobid [Nitrofurantoin] Nausea And Vomiting    Dizziness, nausea, vomiting   Paxil [Paroxetine Hcl] Other (See Comments)    Causes ringing in the ears.    Amoxicillin Rash    Has patient had a PCN reaction causing immediate rash, facial/tongue/throat swelling, SOB or lightheadedness with hypotension: No Has patient had a PCN reaction causing severe rash involving  mucus membranes or skin necrosis: No Has patient had a PCN reaction that required hospitalization: No Has patient had a PCN reaction occurring within the last 10 years: Yes If all of the above answers are "NO", then may proceed with Cephalosporin use.    Farxiga [Dapagliflozin]     RECURRENT YEAST/UTI   Metformin And Related Diarrhea   Tape Rash    Medications Reviewed Today     Reviewed by Chesley Mires, MD (Physician) on 02/20/21 at 1154  Med List Status: <None>   Medication Order Taking? Sig Documenting Provider Last Dose Status Informant  albuterol (VENTOLIN HFA) 108 (90 Base) MCG/ACT inhaler [id number] Yes Inhale 2 puffs into the lungs every 6 (six) hours as needed. Loman Brooklyn, FNP Taking Active   ARIPiprazole (ABILIFY) 10 MG tablet [id number] Yes Take 10 mg by mouth daily. [provider] Taking Active   atorvastatin (LIPITOR) 40 MG tablet [id number] Yes Take 1 tablet (40 mg total) by mouth daily. Loman Brooklyn, FNP Taking Active   azelastine (ASTELIN) 0.1 % nasal spray [id number] Yes Place 1 spray into both nostrils 2 (two) times daily. Use  in each nostril as directed Chesley Mires, MD  Active   azithromycin (ZITHROMAX) 250 MG tablet [id number] Yes Take 2 tablets (500 mg total) by mouth daily for 1 day, THEN 1 tablet (250 mg total) daily for 4 days. Chesley Mires, MD  Active   BELSOMRA 20 MG TABS [id number] Yes Take 1 tablet by mouth at bedtime as needed. [provider] Taking Active   budesonide-formoterol (SYMBICORT) 160-4.5 MCG/ACT inhaler [id number] Yes Inhale 2 puffs into the lungs in the morning and at bedtime. Chesley Mires, MD  Active   cyclobenzaprine (FLEXERIL) 10 MG tablet [id number] Yes  [provider] Taking Active   diazepam (VALIUM) 5 MG tablet [id number] Yes Take 5 mg by mouth in the morning and at bedtime.  [provider] Taking Active   fluconazole (DIFLUCAN) 100 MG tablet [id number] Yes Take 1 tablet (100 mg total) by mouth daily.  Chesley Mires, MD  Active   fluticasone [facility]) 50 MCG/ACT nasal spray [id number] Yes Place 2 sprays into both nostrils daily. Loman Brooklyn, FNP Taking Active   glipiZIDE (GLUCOTROL XL) 5 MG 24 hr tablet [id number] Yes TAKE (1) TABLET BY MOUTH ONCE DAILY WITH EVENING MEAL. Loman Brooklyn, FNP Taking Active   glucose blood (CONTOUR NEXT TEST) test strip [id number] Yes Test BS 4 times daily Dx E11.9 Loman Brooklyn, FNP Taking Active   hydrocortisone cream 0.5 % [id number] Yes Apply 1 application topically 2 (two) times daily. Ivy Lynn, NP Taking Active   lamoTRIgine (LAMICTAL) 200 MG tablet [id number] Yes Take 200 mg by mouth at bedtime. For mood disorder. [provider] Taking Active   lansoprazole (PREVACID) 30 MG capsule [id number] Yes Take 1 capsule (30 mg total) by mouth daily at 12 noon. Loman Brooklyn, FNP Taking Active   levocetirizine (XYZAL) 5 MG tablet [id number] Yes Take 1 tablet (5 mg total) by mouth every evening. Loman Brooklyn, FNP Taking Active   lisinopril (ZESTRIL) 5 MG tablet [id number] Yes TAKE (1) TABLET BY MOUTH ONCE DAILY. Loman Brooklyn, FNP Taking Active   meclizine (ANTIVERT) 25 MG tablet [id number] Yes Take 1 tablet (25 mg total) by mouth 2 (two) times daily as needed for dizziness. Loman Brooklyn, FNP Taking Active   montelukast (SINGULAIR) 10 MG tablet [id number] Yes TAKE (1) TABLET BY MOUTH AT BEDTIME. Loman Brooklyn, FNP Taking Active   mupirocin ointment (BACTROBAN) 2 % [id number] Yes Apply 1 application topically 2 (two) times daily. Warren Danes, PA-C Taking Active   nystatin (MYCOSTATIN) 100000 UNIT/ML suspension [id number] Yes Take 5 mLs (500,000 Units total) by mouth 4 (four) times daily. Sharion Balloon, FNP Taking Active   ondansetron (ZOFRAN ODT) 4 MG disintegrating tablet [id number] Yes Take 1 tablet (4 mg total) by mouth every 8 (eight) hours as needed for nausea or vomiting. Loman Brooklyn, FNP Taking Active   [facility]  Lancets 01B MISC [id number] Yes Use to test blood sugar twice daily. DX: E11.9 Loman Brooklyn, FNP Taking Active   oxybutynin (DITROPAN-XL) 10 MG 24 hr tablet [id number] Yes TAKE (1) TABLET BY MOUTH AT BEDTIME. Loman Brooklyn, FNP Taking Active   oxyCODONE (ROXICODONE) 15 MG immediate release tablet [id number] Yes Take 7.5 mg by mouth every 6 (six) hours as needed.  [provider] Taking Active Self           Med Note Rulon Abide Apr 22, 2018  3:54 PM)    pregabalin (LYRICA) 150  MG capsule [id number] Yes Take 1 capsule (150 mg total) by mouth 2 (two) times daily. Loman Brooklyn, FNP Taking Active   PREMARIN 0.3 MG tablet [id number] Yes TAKE (1) TABLET BY MOUTH ONCE DAILY. Loman Brooklyn, FNP Taking Active   saxagliptin HCl (ONGLYZA) 5 MG TABS tablet [id number] Yes Take 5 mg by mouth daily. [provider] Taking Active            Med Note Blanca Friend, Royce Macadamia   Thu Dec 21, 2020  2:21 PM) samples  sitaGLIPtin (JANUVIA) 100 MG tablet [id number] Yes Take 1 tablet (100 mg total) by mouth daily. Loman Brooklyn, FNP Taking Active   temazepam (RESTORIL) 15 MG capsule [id number] Yes Take 15 mg by mouth at bedtime. [provider] Taking Active            Med Note Redmond Baseman Mar 20, 2020  2:30 PM) 03/20/20 - prescribed but not started yet  traZODone (DESYREL) 100 MG tablet [id number] Yes Take 100 mg by mouth at bedtime. [provider] Taking Active   valACYclovir (VALTREX) 500 MG tablet [id number] Yes Take 1 tablet (500 mg total) by mouth daily as needed (shingles flare up). Gwenlyn Perking, FNP Taking Active   Med List Note Loman Brooklyn, [place] 12/04/19 1526): Not Rx'd by PCP: sertraline, valium, lamotrigine, belsomra, and oxycodone - [facility] 12/02/19.            Patient Active Problem List   Diagnosis Date Noted   Polyneuropathy due to type 2 diabetes mellitus (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   Diabetes mellitus with stage 3 chronic kidney disease ([place]) 07/06/2018   Bilateral temporomandibular joint pain 01/21/2018   OSA (obstructive sleep apnea) 01/21/2018   Vertigo 01/21/2018   DDD (degenerative disc disease), cervical 07/17/2017   Degeneration of lumbar intervertebral disc 07/17/2017   OCD (obsessive compulsive disorder) 11/15/2015   Tobacco use disorder 11/15/2015   Current use of estrogen therapy 01/12/2014   PTSD (post-traumatic stress disorder) 08/03/2011   Bipolar I disorder, most recent episode mixed, severe with psychotic features ([place])     Immunization History  Administered Date(s) Administered   Influenza Split 03/26/2016, 02/17/2019   Influenza,inj,Quad PF,6+ Mos 02/18/2017, 02/17/2019   Influenza,inj,quad, With Preservative 04/03/2015, 04/10/2018, 02/17/2019   Influenza-Unspecified 03/10/2020   Moderna SARS-COV2 Booster Vaccination 06/07/2020   Moderna Sars-Covid-2 Vaccination 08/19/2019, 09/23/2019   Pneumococcal Conjugate-13 10/01/2017   Pneumococcal Polysaccharide-23 04/29/2007   Tdap 11/20/2017    Conditions to be addressed/monitored: DMII  Care Plan : PHARMD MEDICATION MANAGEMENT  Updates made by Lavera Guise, [place] since 03/07/2021 12:00 AM     Problem: DISEASE PROGRESSION PREVENTION      Long-Range Goal: T2DM   Recent Progress: Not on track  Priority: High  Note:   Current Barriers:  Unable to independently afford treatment regimen Unable to achieve control of T2DM   Pharmacist Clinical Goal(s):  Over the next 90 days, patient will verbalize ability to afford treatment regimen achieve control of T2DM as evidenced by IMPROVED GLYCEMIC  CONTROL  through collaboration with PharmD and provider.   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) DISCONTINUED FARXIGA DUE TO RECURRENT UTI/YEAST INFECTIONS DESPITE CONTROLLED SUGARS CONTINUE JANUVIA AND GLIPIZIDE  Current glucose readings: fasting glucose: <180, post prandial glucose: 200s PATIENT INTERESTED IN LIBRE--WILL ATTEMPT TO GET VIA [place] Denies hypoglycemic/hyperglycemic symptoms Discussed meal planning options and Plate method for healthy eating Avoid sugary drinks and desserts Incorporate balanced protein, non starchy veggies, 1 serving of carbohydrate with each meal Increase water intake Increase physical activity as able Current exercise: active job  Educated on current medications, patient on pill packaging with [facility]--discussed JANUVIA Recommended A1C TOMORROW, LIBRE PAPERWORK Continue glipizide (with dinner), CONTINUE JANUVIA  Patient Goals/Self-Care Activities Over the next 90 days, patient will:  - take medications as prescribed check glucose daily (fasting) or if symptomatic, document, and provide at future appointments  Follow Up Plan: Telephone follow up appointment with care management team member scheduled for: 03/08/21      Medication Assistance: None required.  Patient affirms current coverage meets needs.  Patient's preferred pharmacy is:  [facility], [place] [address] [place] [address] Phone: [phone] Fax: [phone]  Uses pill box? Yes USES PILL PACKS Pt endorses  100% compliance  Follow Up:  Patient agrees to Care Plan and Follow-up.  Plan: Face to Face appointment with care management team member scheduled for: 03/08/21  Regina Eck, PharmD, BCPS Clinical Pharmacist, [place]  II Phone [phone]

## 2021-03-07 NOTE — Patient Instructions (Signed)
Visit Information  PATIENT GOALS:  Goals Addressed               This Visit's Progress     Patient Stated     T2DM-PharmD (pt-stated)        Current Barriers:  Unable to independently afford treatment regimen Unable to achieve control of T2DM   Pharmacist Clinical Goal(s):  Over the next 90 days, patient will verbalize ability to afford treatment regimen achieve control of T2DM as evidenced by IMPROVED GLYCEMIC CONTROL through collaboration with PharmD and provider.   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) DISCONTINUED FARXIGA DUE TO RECURRENT UTI/YEAST INFECTIONS DESPITE CONTROLLED SUGARS CONTINUE JANUVIA AND GLIPIZIDE  Current glucose readings: fasting glucose: <180, post prandial glucose: 200s PATIENT INTERESTED IN LIBRE--WILL ATTEMPT TO GET VIA [place] Denies hypoglycemic/hyperglycemic symptoms Discussed meal planning options and Plate method for healthy eating Avoid sugary drinks and desserts Incorporate balanced protein, non starchy veggies, 1 serving of carbohydrate with each meal Increase water intake Increase physical activity as able Current exercise: active job  Educated on current medications, patient on pill packaging with [facility]--discussed JANUVIA Recommended A1C TOMORROW, LIBRE PAPERWORK Continue glipizide (with dinner), CONTINUE JANUVIA  Patient Goals/Self-Care Activities Over the next 90 days, patient will:  - take medications as prescribed check glucose daily (fasting) or if symptomatic, document, and provide at future  appointments  Follow Up Plan: Telephone follow up appointment with care management team member scheduled for: 03/08/21         The patient verbalized understanding of instructions, educational materials, and care plan provided today and declined offer to receive copy of patient instructions, educational materials, and care plan.   Face to Face appointment with care management team member scheduled for: 03/08/21  Signature Regina Eck, PharmD, BCPS Clinical Pharmacist, [place]  II Phone [phone]

## 2021-03-07 NOTE — Telephone Encounter (Signed)
WILL ON CALL RE: DIABETES PATIENT REQUESTING DIABETIC SHOE PRESCRIPTION---VISIT W/ PCP FOR THIS ON 01/10/21 PER CHART NOTES WANTS RX SENT TO [facility] I KNOW THIS HAS COME UP BEFORE--UNSURE IF SHE IS TO SEE PODIATRY, ETC---IF SO, LET ME KNOW AND I CAN CALL HER BACK JUST TRYING TO HELP HER OUT!  THANKS!

## 2021-03-08 ENCOUNTER — Ambulatory Visit (INDEPENDENT_AMBULATORY_CARE_PROVIDER_SITE_OTHER): Payer: Medicare Other | Admitting: Pharmacist

## 2021-03-08 ENCOUNTER — Other Ambulatory Visit: Payer: Self-pay

## 2021-03-08 DIAGNOSIS — J343 Hypertrophy of nasal turbinates: Secondary | ICD-10-CM | POA: Diagnosis not present

## 2021-03-08 DIAGNOSIS — E11649 Type 2 diabetes mellitus with hypoglycemia without coma: Secondary | ICD-10-CM | POA: Diagnosis not present

## 2021-03-08 DIAGNOSIS — Z23 Encounter for immunization: Secondary | ICD-10-CM | POA: Diagnosis not present

## 2021-03-08 DIAGNOSIS — H903 Sensorineural hearing loss, bilateral: Secondary | ICD-10-CM | POA: Diagnosis not present

## 2021-03-08 DIAGNOSIS — J342 Deviated nasal septum: Secondary | ICD-10-CM | POA: Diagnosis not present

## 2021-03-08 DIAGNOSIS — R0982 Postnasal drip: Secondary | ICD-10-CM | POA: Diagnosis not present

## 2021-03-08 DIAGNOSIS — J310 Chronic rhinitis: Secondary | ICD-10-CM | POA: Diagnosis not present

## 2021-03-08 DIAGNOSIS — R42 Dizziness and giddiness: Secondary | ICD-10-CM

## 2021-03-08 LAB — BAYER DCA HB A1C WAIVED: HB A1C (BAYER DCA - WAIVED): 7.4 % — ABNORMAL HIGH (ref 4.8–5.6)

## 2021-03-08 MED ORDER — MECLIZINE HCL 25 MG PO TABS: 50.0000 mg | ORAL_TABLET | Freq: Every day | ORAL | 2 refills | Status: DC | PRN

## 2021-03-08 NOTE — Telephone Encounter (Signed)
Pt needs a F2F visit with detailed food exam documentation and order for diabetic shoes or yes she can go to a podiatrist. She can do this at her 03/29/21 visit if she would like.

## 2021-03-08 NOTE — Patient Instructions (Signed)
Visit Information  PATIENT GOALS:  Goals Addressed               This Visit's Progress     Patient Stated     T2DM-PharmD (pt-stated)        Current Barriers:  Unable to independently afford treatment regimen Unable to achieve control of T2DM   Pharmacist Clinical Goal(s):  Over the next 90 days, patient will verbalize ability to afford treatment regimen achieve control of T2DM as evidenced by IMPROVED GLYCEMIC CONTROL through collaboration with PharmD and provider.   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) DISCONTINUED FARXIGA DUE TO RECURRENT UTI/YEAST INFECTIONS DESPITE CONTROLLED SUGARS CONTINUE [place] AND GLIPIZIDE  May consider rybelsus at follow up--would like to review libre data Current glucose readings: fasting glucose: <180, post prandial glucose: 200s PATIENT INTERESTED IN LIBRE--SUBMITTED LIBRE 2 ORDER TO ADV DIABETES SUPPLY VIA [place] Denies hypoglycemic/hyperglycemic symptoms Discussed meal planning options and Plate method for healthy eating--Patient admits to not eating wisely, she is trying to work on this Avoid sugary drinks and desserts Incorporate balanced protein, non starchy veggies, 1 serving of carbohydrate with each meal Increase water intake Increase physical activity as able Current exercise: active job  Educated on current medications, patient on pill packaging with [facility]--discussed JANUVIA Recommended may switch to GLP1 (rybelsus), LIBRE PAPERWORK Continue glipizide (with dinner), CONTINUE JANUVIA  Patient  Goals/Self-Care Activities Over the next 90 days, patient will:  - take medications as prescribed check glucose daily (fasting) or if symptomatic, document, and provide at future appointments  Follow Up Plan: Telephone follow up appointment with care management team member scheduled for: 1 month         The patient verbalized understanding of instructions, educational materials, and care plan provided today and declined offer to receive copy of patient instructions, educational materials, and care plan.   Face to Face appointment with care management team member scheduled for:  03/29/21  Signature Regina Eck, PharmD, BCPS Clinical Pharmacist, [place]  II Phone [phone]

## 2021-03-08 NOTE — Progress Notes (Signed)
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) DISCONTINUED FARXIGA DUE TO RECURRENT UTI/YEAST INFECTIONS DESPITE CONTROLLED SUGARS CONTINUE JANUVIA AND GLIPIZIDE  May consider rybelsus at follow up--would like to review libre data Current glucose readings: fasting glucose: <180, post prandial glucose: 200s PATIENT INTERESTED IN LIBRE--SUBMITTED LIBRE 2 ORDER TO ADV DIABETES SUPPLY VIA [place] Denies hypoglycemic/hyperglycemic symptoms Discussed meal planning options and Plate method for healthy eating--Patient admits to not eating wisely, she is trying to work on this Avoid sugary drinks and desserts Incorporate balanced protein, non starchy veggies, 1 serving of carbohydrate with each meal Increase water intake Increase physical activity as able Current exercise: active job  Educated on current medications, patient on pill packaging with [facility]--discussed JANUVIA Recommended may switch to GLP1 (rybelsus), LIBRE PAPERWORK Continue glipizide (with dinner), CONTINUE JANUVIA  Follow Up Plan: Telephone follow up appointment with care management team member scheduled for: 1 month  Subjective: Cynthia Deleon is an 65 y.o. year old female who is a primary patient of Loman Brooklyn, FNP.  The CCM team was consulted for assistance with disease management and care coordination needs.    Engaged with patient face to face for follow up visit in response to provider referral for pharmacy case management and/or care coordination services.   Consent to Services:  The patient was given information about Chronic Care Management services, agreed to services, and gave verbal consent prior to initiation of services.  Please see initial visit  note for detailed documentation.   Patient Care Team: Loman Brooklyn, FNP as PCP - General (Family Medicine) Suella Broad, MD as Consulting Physician (Physical Medicine and Rehabilitation) Norma Fredrickson, MD as Consulting Physician (Psychiatry) Ilean China, RN as Registered Nurse Phillips Odor, MD as Consulting Physician (Neurology) Lavera Guise, [facility] (Pharmacist) Warren Danes, PA-C as Physician Assistant (Dermatology)  Objective:  Lab Results  Component Value Date   CREATININE 0.84 10/10/2020   CREATININE 0.72 06/08/2020   CREATININE 0.75 03/07/2020    Lab Results  Component Value Date   HGBA1C 6.4 10/10/2020   Last diabetic Eye exam:  Lab Results  Component Value Date/Time   HMDIABEYEEXA No Retinopathy 07/25/2020 12:00 AM    Last diabetic Foot exam: No results found for: HMDIABFOOTEX      Component Value Date/Time   CHOL 116 10/10/2020 0954   TRIG 72 10/10/2020 0954   HDL 40 10/10/2020 0954   CHOLHDL 2.9 10/10/2020 0954   CHOLHDL 4.1 11/16/2015 0715   VLDL 48 (H) 11/16/2015 0715   LDLCALC 61 10/10/2020 0954    Hepatic Function Latest Ref Rng & Units 10/10/2020 03/07/2020 12/02/2019  Total Protein 6.0 - 8.5 g/dL 6.7 6.3 6.3  Albumin 3.8 - 4.8 g/dL 5.1(H) 4.6 4.4  AST 0 - 40 IU/L _0 ALT 0 - 32 IU/L _1 Alk Phosphatase 44 - 121 IU/L 55 68 84  Total Bilirubin 0.0 - 1.2 mg/dL 0.7 0.4 0.3  Bilirubin, Direct 0.1 - 0.5 mg/dL - - -    Lab Results  Component Value Date/Time   TSH 1.220 07/04/2020 09:58 AM   TSH 1.060 03/07/2020 08:52 AM    CBC Latest Ref Rng & Units 10/10/2020 06/08/2020 12/02/2019  WBC 3.4 - 10.8 x10E3/uL 6.5 11.0(H) 7.8  Hemoglobin 11.1 -  15.9 g/dL 15.5 15.8 15.2  Hematocrit 34.0 - 46.6 % 44.5 45.7 43.6  Platelets 150 - 450 x10E3/uL 267 201 214    No results found for: VD25OH  Clinical ASCVD: No  The ASCVD Risk score (Arnett DK, et al., 2019) failed to calculate for the following reasons:   The valid total  cholesterol range is 130 to 320 mg/dL    Other: (CHADS2VASc if Afib, PHQ9 if depression, MMRC or CAT for COPD, ACT, DEXA)  Social History   Tobacco Use  Smoking Status Former   Packs/day: 0.50   Years: 35.00   Pack years: 17.50   Types: Cigarettes   Quit date: 05/11/2019   Years since quitting: 1.8  Smokeless Tobacco Never   BP Readings from Last 3 Encounters:  02/20/21 110/60  01/02/21 113/71  12/18/20 126/69   Pulse Readings from Last 3 Encounters:  02/20/21 84  01/02/21 90  12/18/20 78   Wt Readings from Last 3 Encounters:  02/20/21 178 lb (80.7 kg)  01/02/21 178 lb (80.7 kg)  12/18/20 176 lb (79.8 kg)    Assessment: Review of patient past medical history, allergies, medications, health status, including review of consultants reports, laboratory and other test data, was performed as part of comprehensive evaluation and provision of chronic care management services.   SDOH:  (Social Determinants of Health) assessments and interventions performed:    CCM Care Plan  Allergies  Allergen Reactions   Chantix [Varenicline] Other (See Comments)    Altered mental status-Per patient was put on allergy list by Dr Lorriane Shire bu patient staes she is not allergic to this and is currently taking it.    Divalproex Sodium Other (See Comments)    Hallucinations  Other reaction(s): Confusion   Other Anaphylaxis   Pseudoeph-Hydrocodone-Gg Nausea Only   Sulfa Antibiotics Anaphylaxis and Nausea Only   Varenicline Tartrate Other (See Comments)    Other reaction(s): Confusion   Ambien [Zolpidem] Other (See Comments)    Causes sleep walking   Clavulanic Acid Diarrhea   Codeine Nausea And Vomiting   Elemental Sulfur Other (See Comments)    Swelling of tongue   Hydrocodone Nausea And Vomiting   Macrobid [Nitrofurantoin] Nausea And Vomiting    Dizziness, nausea, vomiting   Paxil [Paroxetine Hcl] Other (See Comments)    Causes ringing in the ears.    Amoxicillin Rash    Has patient  had a PCN reaction causing immediate rash, facial/tongue/throat swelling, SOB or lightheadedness with hypotension: No Has patient had a PCN reaction causing severe rash involving mucus membranes or skin necrosis: No Has patient had a PCN reaction that required hospitalization: No Has patient had a PCN reaction occurring within the last 10 years: Yes If all of the above answers are "NO", then may proceed with Cephalosporin use.    Wilder Glade [Dapagliflozin]     RECURRENT YEAST/UTI   Metformin And Related Diarrhea   Tape Rash    Medications Reviewed Today     Reviewed by Lavera Guise, [facility] (Pharmacist) on 03/08/21 at 41  Med List Status: <None>   Medication Order Taking? Sig Documenting Provider Last Dose Status Informant  albuterol (VENTOLIN HFA) 108 (90 Base) MCG/ACT inhaler [id number]  Inhale 2 puffs into the lungs every 6 (six) hours as needed. Loman Brooklyn, FNP  Active   ARIPiprazole (ABILIFY) 10 MG tablet [id number]  Take 10 mg by mouth daily. [provider]  Active   atorvastatin (LIPITOR) 40 MG tablet [id number]  Take 1 tablet (  40 mg total) by mouth daily. Loman Brooklyn, FNP  Active   azelastine (ASTELIN) 0.1 % nasal spray [id number]  Place 1 spray into both nostrils 2 (two) times daily. Use in each nostril as directed Chesley Mires, MD  Active   BELSOMRA 20 MG TABS [id number]  Take 1 tablet by mouth at bedtime as needed. [provider]  Active   benzonatate (TESSALON) 200 MG capsule [id number]  Take 1 capsule (200 mg total) by mouth 3 (three) times daily as needed for cough. Chesley Mires, MD  Active   budesonide-formoterol [facility]) 160-4.5 MCG/ACT inhaler [id number]  Inhale 2 puffs into the lungs in the morning and at bedtime. Chesley Mires, MD  Active   Continuous Blood Gluc Sensor (FREESTYLE LIBRE 2 SENSOR) [place] [id number] Yes by Does not apply route. [provider]  Active            Med Note Blanca Friend,  D   Thu Mar 08, 2021 10:31 AM) SENT TO ADV  DIABETES SUPPLY FOR APPROVAL  cyclobenzaprine (FLEXERIL) 10 MG tablet [id number]   [provider]  Active   diazepam (VALIUM) 5 MG tablet [id number]  Take 5 mg by mouth in the morning and at bedtime.  [provider]  Active   fluconazole (DIFLUCAN) 100 MG tablet [id number]  Take 1 tablet (100 mg total) by mouth daily. Chesley Mires, MD  Active   fluticasone [facility]) 50 MCG/ACT nasal spray [id number]  Place 2 sprays into both nostrils daily. Loman Brooklyn, FNP  Active   glipiZIDE (GLUCOTROL XL) 5 MG 24 hr tablet [id number]  TAKE (1) TABLET BY MOUTH ONCE DAILY WITH EVENING MEAL. Loman Brooklyn, FNP  Active   glucose blood (CONTOUR NEXT TEST) test strip [id number]  Test BS 4 times daily Dx E11.9 Loman Brooklyn, FNP  Active   hydrocortisone cream 0.5 % [id number]  Apply 1 application topically 2 (two) times daily. Ivy Lynn, NP  Active   lamoTRIgine (LAMICTAL) 200 MG tablet [id number]  Take 200 mg by mouth at bedtime. For mood disorder. [provider]  Active   lansoprazole (PREVACID) 30 MG capsule [id number]  Take 1 capsule (30 mg total) by mouth daily at 12 noon. Loman Brooklyn, FNP  Active   levocetirizine (XYZAL) 5 MG tablet [id number]  Take 1 tablet (5 mg total) by mouth every evening. Loman Brooklyn, FNP  Active   lisinopril (ZESTRIL) 5 MG tablet [id number]  TAKE (1) TABLET BY MOUTH ONCE DAILY. [name] F, FNP  Active   meclizine (ANTIVERT) 25 MG tablet [id number]  Take 2 tablets (50 mg total) by mouth daily as needed for dizziness. Loman Brooklyn, FNP  Active   montelukast (SINGULAIR) 10 MG tablet [id number]  TAKE (1) TABLET BY MOUTH AT BEDTIME. Loman Brooklyn, FNP  Active   mupirocin ointment (BACTROBAN) 2 % [id number]  Apply 1 application topically 2 (two) times daily. Sheffield, Ronalee Red, PA-C  Active   nystatin (MYCOSTATIN) 100000 UNIT/ML suspension [id number]  Take 5 mLs (500,000 Units total) by mouth 4 (four) times daily. [name] A, FNP  Active    ondansetron (ZOFRAN ODT) 4 MG disintegrating tablet [id number]  Take 1 tablet (4 mg total) by mouth every 8 (eight) hours as needed for nausea or vomiting. Loman Brooklyn, FNP  Active   OneTouch Delica Lancets 53I MISC [id number]  Use to test blood sugar twice daily. DX: E11.9 Loman Brooklyn, FNP  Active   oxybutynin (DITROPAN-XL) 10 MG  24 hr tablet [id number]  TAKE (1) TABLET BY MOUTH AT BEDTIME. Loman Brooklyn, FNP  Active   oxyCODONE (ROXICODONE) 15 MG immediate release tablet [id number]  Take 7.5 mg by mouth every 6 (six) hours as needed.  [provider]  Active Self           Med Note Rulon Abide Apr 22, 2018  3:54 PM)    pregabalin (LYRICA) 150 MG capsule [id number]  Take 1 capsule (150 mg total) by mouth 2 (two) times daily. Loman Brooklyn, FNP  Active   PREMARIN 0.3 MG tablet [id number]  TAKE (1) TABLET BY MOUTH ONCE DAILY. Loman Brooklyn, FNP  Active   sitaGLIPtin (JANUVIA) 100 MG tablet [id number]  Take 1 tablet (100 mg total) by mouth daily. Loman Brooklyn, FNP  Active   temazepam (RESTORIL) 15 MG capsule [id number]  Take 15 mg by mouth at bedtime. [provider]  Active            Med Note Rich Reining, Elson Clan Mar 20, 2020  2:30 PM) 03/20/20 - prescribed but not started yet  traZODone (DESYREL) 100 MG tablet [id number]  Take 100 mg by mouth at bedtime. [provider]  Active   valACYclovir (VALTREX) 500 MG tablet [id number]  Take 1 tablet (500 mg total) by mouth daily as needed (shingles flare up). Gwenlyn Perking, FNP  Active   Med List Note Loman Brooklyn, [place] 12/04/19 1526): Not Rx'd by PCP: sertraline, valium, lamotrigine, belsomra, and oxycodone - [facility] 12/02/19.            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   Diabetes mellitus with stage 3 chronic kidney disease ([place]) 07/06/2018   Bilateral temporomandibular joint pain 01/21/2018   OSA (obstructive sleep apnea) 01/21/2018   Vertigo 01/21/2018   DDD (degenerative disc disease), cervical 07/17/2017   Degeneration of lumbar intervertebral disc 07/17/2017   OCD (obsessive compulsive disorder) 11/15/2015   Tobacco use disorder 11/15/2015   Current use of estrogen therapy 01/12/2014   PTSD (post-traumatic stress disorder) 08/03/2011   Bipolar I disorder, most recent episode mixed, severe with psychotic features ([place])     Immunization History  Administered Date(s) Administered   Influenza Split 03/26/2016, 02/17/2019   Influenza,inj,Quad PF,6+ Mos 02/18/2017, 02/17/2019   Influenza,inj,quad, With Preservative 04/03/2015, 04/10/2018, 02/17/2019   Influenza-Unspecified 03/10/2020   Moderna SARS-COV2 Booster Vaccination 06/07/2020   Moderna Sars-Covid-2 Vaccination 08/19/2019, 09/23/2019   Pneumococcal Conjugate-13 10/01/2017   Pneumococcal Polysaccharide-23 04/29/2007   Tdap 11/20/2017    Conditions to be addressed/monitored: DMII  Care Plan : PHARMD MEDICATION MANAGEMENT  Updates made by Lavera Guise, [place] since 03/08/2021 12:00 AM     Problem: DISEASE PROGRESSION PREVENTION      Long-Range Goal: T2DM   Recent Progress: Not on track  Priority: High  Note:   Current Barriers:  Unable to independently afford treatment regimen Unable to achieve control of T2DM   Pharmacist Clinical Goal(s):  Over the next 90 days, patient will verbalize ability to  afford treatment regimen achieve control of T2DM as evidenced by IMPROVED GLYCEMIC CONTROL  through collaboration with PharmD and provider.   Interventions: 1:1  collaboration with Loman Brooklyn, FNP regarding development and update of comprehensive plan of care as evidenced by provider attestation and co-signature Inter-disciplinary care team collaboration (see longitudinal plan of care) Comprehensive medication review performed; medication list updated in electronic medical record  Diabetes: Uncontrolled-A1C 7.4% TODAY; current treatment: GLIPIZIDE, JANUVIA INTOLERANCES METFORMIN (DUE TO GI) DISCONTINUED FARXIGA DUE TO RECURRENT UTI/YEAST INFECTIONS DESPITE CONTROLLED SUGARS CONTINUE [place] AND GLIPIZIDE  May consider rybelsus at follow up--would like to review libre data Current glucose readings: fasting glucose: <180, post prandial glucose: 200s PATIENT INTERESTED IN LIBRE--SUBMITTED LIBRE 2 ORDER TO ADV DIABETES SUPPLY VIA [place] Denies hypoglycemic/hyperglycemic symptoms Discussed meal planning options and Plate method for healthy eating--Patient admits to not eating wisely, she is trying to work on this Avoid sugary drinks and desserts Incorporate balanced protein, non starchy veggies, 1 serving of carbohydrate with each meal Increase water intake Increase physical activity as able Current exercise: active job  Educated on current medications, patient on pill packaging with [facility]--discussed JANUVIA Recommended may switch to GLP1 (rybelsus), LIBRE PAPERWORK Continue glipizide (with dinner), CONTINUE JANUVIA  Patient Goals/Self-Care Activities Over the next 90 days, patient will:  - take medications as prescribed check glucose daily (fasting) or if symptomatic, document, and provide at future appointments  Follow Up Plan: Telephone follow up appointment with care management team member scheduled for: 1 month      Medication Assistance: None required.  Patient affirms current coverage meets needs.  Patient's preferred pharmacy is:  [facility], [place] [address] [place] [address] Phone: [phone] Fax: [phone]  Follow Up:  Patient agrees to Care Plan and Follow-up.  Plan: Face to Face appointment with care management team member scheduled for: 03/29/21  Regina Eck, PharmD, BCPS Clinical Pharmacist, [place]  II Phone [phone]

## 2021-03-09 DIAGNOSIS — E11649 Type 2 diabetes mellitus with hypoglycemia without coma: Secondary | ICD-10-CM | POA: Diagnosis not present

## 2021-03-12 DIAGNOSIS — M5412 Radiculopathy, cervical region: Secondary | ICD-10-CM | POA: Diagnosis not present

## 2021-03-15 ENCOUNTER — Other Ambulatory Visit: Payer: Self-pay

## 2021-03-15 ENCOUNTER — Encounter: Payer: Self-pay | Admitting: Pulmonary Disease

## 2021-03-15 ENCOUNTER — Ambulatory Visit: Payer: Medicare Other | Admitting: Pulmonary Disease

## 2021-03-15 VITALS — BP 132/78 | HR 74 | Temp 98.2°F | Ht 69.0 in | Wt 179.0 lb

## 2021-03-15 DIAGNOSIS — R0683 Snoring: Secondary | ICD-10-CM | POA: Diagnosis not present

## 2021-03-15 DIAGNOSIS — Z87891 Personal history of nicotine dependence: Secondary | ICD-10-CM | POA: Diagnosis not present

## 2021-03-15 DIAGNOSIS — J3089 Other allergic rhinitis: Secondary | ICD-10-CM | POA: Diagnosis not present

## 2021-03-15 DIAGNOSIS — J4550 Severe persistent asthma, uncomplicated: Secondary | ICD-10-CM

## 2021-03-15 MED ORDER — FLUCONAZOLE 100 MG PO TABS: 100.0000 mg | ORAL_TABLET | Freq: Every day | ORAL | 0 refills | Status: DC

## 2021-03-15 MED ORDER — GUAIFENESIN ER 600 MG PO TB12: 1200.0000 mg | ORAL_TABLET | Freq: Two times a day (BID) | ORAL | Status: DC | PRN

## 2021-03-15 MED ORDER — BUDESONIDE-FORMOTEROL FUMARATE 160-4.5 MCG/ACT IN AERO: 2.0000 | INHALATION_SPRAY | Freq: Two times a day (BID) | RESPIRATORY_TRACT | 6 refills | Status: DC

## 2021-03-15 NOTE — Patient Instructions (Signed)
Will schedule home sleep study. Will call you with results of CT chest. Switch to symbicort 80-4.5 two puffs in the morning and two puffs in the evening, and rinse your mouth after each use. Will arrange for spacer device to use with symbicort. Diflucan 1 pill daily for 5 days. Mucinex 1200 mg twice per day as needed to help with cough and loosen chest congestion.  You can get this over the counter without a prescription. Flonase 1 spray in each nostril daily. Azelastine 1 spray in each nostril in the morning and in the evening.  You can get this over the counter without a prescription. Singulair 10 mg pill nightly. Follow up in 5 months.

## 2021-03-15 NOTE — Progress Notes (Signed)
Pulmonary, Critical Care, and Sleep Medicine  Chief Complaint  Patient presents with   Follow-up    Not using cpap currently because of issues with pressure. Cough is still present but not as severe as last visit.     Constitutional:  BP 132/78 (BP Location: Left Arm, Patient Position: Sitting)   Pulse 74   Temp 98.2 F (36.8 C) (Temporal)   Ht 5\' 9"  (1.753 m)   Wt 179 lb (81.2 kg)   SpO2 97%   BMI 26.43 kg/m   Past Medical History:  OA, Bipolar, DM, Back pain, Depression, Diplopia, GERD, Headache, HTN, IBS, Nephrolithiasis, Multiple personality disorder, Panic attacks, PUD, Neuropathy, Shingles, Rosacea  Past Surgical History:  She  has a past surgical history that includes Tonsillectomy; Foot arthrodesis (2000); Bunionectomy with hammertoe reconstruction (Right, 12/10/2012); Rectal surgery; Liposuction (03/2018); Colonoscopy with propofol (N/A, 04/27/2020); biopsy (04/27/2020); and Total abdominal hysterectomy.  Brief Summary:  Cynthia Deleon is a 65 y.o. female former smoker with dyspnea and obstructive sleep apnea.       Subjective:   She has CT chest scheduled for 03/20/21.  It doesn't look like sleep study was completed.  She continues to have snoring and disrupted sleep.  Her cough is better than before.  She hasn't been using azelastine.  Singulair has helped.  She is getting thrush again and has yeast in her pelvic region also.    Physical Exam:   Appearance - well kempt   ENMT - no sinus tenderness, no oral exudate, no LAN, Mallampati 3 airway, no stridor, raspy voice, mild white build up in posterior pharynx  Respiratory - equal breath sounds bilaterally, no wheezing or rales  CV - s1s2 regular rate and rhythm, no murmurs  Ext - no clubbing, no edema  Skin - no rashes  Psych - normal mood and affect    Pulmonary testing:  PFT 05/13/19 >> FEV1 2.06 (69%), FEV1% 78, TLC 4.56 (78%), DLCO 97%, +BD  Chest Imaging:  LDCT chest 04/20/19 >>  mild centrilobular emphysema, atherosclerosis, 3 mm nodule RML, fatty liver  Sleep Tests:    Cardiac Tests:  Echo 02/17/18 >> EF 55 to 60%, grade 1 DD  Social History:  She  reports that she quit smoking about 22 months ago. Her smoking use included cigarettes. She has a 17.50 pack-year smoking history. She has never used smokeless tobacco. She reports that she does not drink alcohol and does not use drugs.  Family History:  Her family history includes Alcohol abuse in her brother, brother, brother, and brother; COPD in her brother, father, and sister; Diabetes in her maternal grandfather, maternal grandmother, mother, and sister; Hyperlipidemia in her sister; Other in her brother, mother, and sister.     Assessment/Plan:   Allergic asthma and emphysema. - anoro was not effective - change symbicort to 80-4.5 two puffs bid - prn albuterol - will arrange for spacer device - continue singulair - prn mucinex  Perennial allergic rhinitis. - continue flonase, azelastine  History of tobacco abuse. - she has low dose CT chest for lung cancer screening scheduled for later this month  Snoring with history of obstructive sleep apnea. - she has history of depression and hypertension -  will schedule home sleep study to assess current status of sleep apnea and then determine if she needs additional intervention  Thrush. - will give her course of diflucan  Time Spent Involved in Patient Care on Day of Examination:  33 minutes  Follow up:  Patient Instructions  Will schedule home sleep study. Will call you with results of CT chest. Switch to symbicort 80-4.5 two puffs in the morning and two puffs in the evening, and rinse your mouth after each use. Will arrange for spacer device to use with symbicort. Diflucan 1 pill daily for 5 days. Mucinex 1200 mg twice per day as needed to help with cough and loosen chest congestion.  You can get this over the counter without a  prescription. Flonase 1 spray in each nostril daily. Azelastine 1 spray in each nostril in the morning and in the evening.  You can get this over the counter without a prescription. Singulair 10 mg pill nightly. Follow up in 5 months.  Medication List:   Allergies as of 03/15/2021       Reactions   Chantix [varenicline] Other (See Comments)   Altered mental status-Per patient was put on allergy list by Dr Lorriane Shire bu patient staes she is not allergic to this and is currently taking it.    Divalproex Sodium Other (See Comments)   Hallucinations  Other reaction(s): Confusion   Other Anaphylaxis   Pseudoeph-hydrocodone-gg Nausea Only   Sulfa Antibiotics Anaphylaxis, Nausea Only   Varenicline Tartrate Other (See Comments)   Other reaction(s): Confusion   Ambien [zolpidem] Other (See Comments)   Causes sleep walking   Clavulanic Acid Diarrhea   Codeine Nausea And Vomiting   Elemental Sulfur Other (See Comments)   Swelling of tongue   Hydrocodone Nausea And Vomiting   Macrobid [nitrofurantoin] Nausea And Vomiting   Dizziness, nausea, vomiting   Paxil [paroxetine Hcl] Other (See Comments)   Causes ringing in the ears.    Amoxicillin Rash   Has patient had a PCN reaction causing immediate rash, facial/tongue/throat swelling, SOB or lightheadedness with hypotension: No Has patient had a PCN reaction causing severe rash involving mucus membranes or skin necrosis: No Has patient had a PCN reaction that required hospitalization: No Has patient had a PCN reaction occurring within the last 10 years: Yes If all of the above answers are "NO", then may proceed with Cephalosporin use.   Farxiga [dapagliflozin]    RECURRENT YEAST/UTI   Metformin And Related Diarrhea   Tape Rash        Medication List        Accurate as of March 15, 2021 10:26 AM. If you have any questions, ask your nurse or doctor.          STOP taking these medications    benzonatate 200 MG capsule Commonly  known as: TESSALON Stopped by: Chesley Mires, MD       TAKE these medications    albuterol 108 (90 Base) MCG/ACT inhaler Commonly known as: VENTOLIN HFA Inhale 2 puffs into the lungs every 6 (six) hours as needed.   ARIPiprazole 10 MG tablet Commonly known as: ABILIFY Take 10 mg by mouth daily.   atorvastatin 40 MG tablet Commonly known as: LIPITOR Take 1 tablet (40 mg total) by mouth daily.   azelastine 0.1 % nasal spray Commonly known as: ASTELIN Place 1 spray into both nostrils 2 (two) times daily. Use in each nostril as directed   Belsomra 20 MG Tabs Generic drug: Suvorexant Take 1 tablet by mouth at bedtime as needed.   budesonide-formoterol 160-4.5 MCG/ACT inhaler Commonly known as: Symbicort Inhale 2 puffs into the lungs in the morning and at bedtime.   Contour Next Test test strip Generic drug: glucose blood Test BS 4 times daily Dx  E11.9   cyclobenzaprine 10 MG tablet Commonly known as: FLEXERIL   diazepam 5 MG tablet Commonly known as: VALIUM Take 5 mg by mouth in the morning and at bedtime.   fluconazole 100 MG tablet Commonly known as: Diflucan Take 1 tablet (100 mg total) by mouth daily.   fluticasone 50 MCG/ACT nasal spray Commonly known as: FLONASE Place 2 sprays into both nostrils daily.   FreeStyle Libre 2 Sensor Misc by Does not apply route.   glipiZIDE 5 MG 24 hr tablet Commonly known as: GLUCOTROL XL TAKE (1) TABLET BY MOUTH ONCE DAILY WITH EVENING MEAL.   guaiFENesin 600 MG 12 hr tablet Commonly known as: Mucinex Take 2 tablets (1,200 mg total) by mouth 2 (two) times daily as needed for to loosen phlegm or cough. Started by: Chesley Mires, MD   hydrocortisone cream 0.5 % Apply 1 application topically 2 (two) times daily.   lamoTRIgine 200 MG tablet Commonly known as: LAMICTAL Take 200 mg by mouth at bedtime. For mood disorder.   lansoprazole 30 MG capsule Commonly known as: PREVACID Take 1 capsule (30 mg total) by mouth daily at  12 noon.   levocetirizine 5 MG tablet Commonly known as: XYZAL Take 1 tablet (5 mg total) by mouth every evening.   lisinopril 5 MG tablet Commonly known as: ZESTRIL TAKE (1) TABLET BY MOUTH ONCE DAILY.   meclizine 25 MG tablet Commonly known as: ANTIVERT Take 2 tablets (50 mg total) by mouth daily as needed for dizziness.   montelukast 10 MG tablet Commonly known as: SINGULAIR TAKE (1) TABLET BY MOUTH AT BEDTIME.   mupirocin ointment 2 % Commonly known as: BACTROBAN Apply 1 application topically 2 (two) times daily.   nystatin 100000 UNIT/ML suspension Commonly known as: MYCOSTATIN Take 5 mLs (500,000 Units total) by mouth 4 (four) times daily.   ondansetron 4 MG disintegrating tablet Commonly known as: Zofran ODT Take 1 tablet (4 mg total) by mouth every 8 (eight) hours as needed for nausea or vomiting.   OneTouch Delica Lancets 10F Misc Use to test blood sugar twice daily. DX: E11.9   oxybutynin 10 MG 24 hr tablet Commonly known as: DITROPAN-XL TAKE (1) TABLET BY MOUTH AT BEDTIME.   oxyCODONE 15 MG immediate release tablet Commonly known as: ROXICODONE Take 7.5 mg by mouth every 6 (six) hours as needed.   pregabalin 150 MG capsule Commonly known as: LYRICA Take 1 capsule (150 mg total) by mouth 2 (two) times daily.   Premarin 0.3 MG tablet Generic drug: estrogens (conjugated) TAKE (1) TABLET BY MOUTH ONCE DAILY.   sitaGLIPtin 100 MG tablet Commonly known as: Januvia Take 1 tablet (100 mg total) by mouth daily.   temazepam 15 MG capsule Commonly known as: RESTORIL Take 15 mg by mouth at bedtime.   traZODone 100 MG tablet Commonly known as: DESYREL Take 100 mg by mouth at bedtime.   valACYclovir 500 MG tablet Commonly known as: VALTREX Take 1 tablet (500 mg total) by mouth daily as needed (shingles flare up).        Signature:  Chesley Mires, MD [place] Pager - [phone] 03/15/2021, 10:26 AM

## 2021-03-20 ENCOUNTER — Ambulatory Visit (HOSPITAL_COMMUNITY): Admission: RE | Admit: 2021-03-20 | Payer: Medicare Other | Source: Ambulatory Visit

## 2021-03-21 DIAGNOSIS — E1165 Type 2 diabetes mellitus with hyperglycemia: Secondary | ICD-10-CM | POA: Diagnosis not present

## 2021-03-22 ENCOUNTER — Institutional Professional Consult (permissible substitution): Payer: 59 | Admitting: Internal Medicine

## 2021-03-23 ENCOUNTER — Telehealth: Payer: Self-pay | Admitting: Family Medicine

## 2021-03-26 ENCOUNTER — Institutional Professional Consult (permissible substitution): Payer: 59 | Admitting: Pulmonary Disease

## 2021-03-27 DIAGNOSIS — G894 Chronic pain syndrome: Secondary | ICD-10-CM | POA: Diagnosis not present

## 2021-03-27 DIAGNOSIS — M5030 Other cervical disc degeneration, unspecified cervical region: Secondary | ICD-10-CM | POA: Diagnosis not present

## 2021-03-27 DIAGNOSIS — M5136 Other intervertebral disc degeneration, lumbar region: Secondary | ICD-10-CM | POA: Diagnosis not present

## 2021-03-27 DIAGNOSIS — M5412 Radiculopathy, cervical region: Secondary | ICD-10-CM | POA: Diagnosis not present

## 2021-03-28 ENCOUNTER — Telehealth: Payer: Self-pay | Admitting: Pulmonary Disease

## 2021-03-28 NOTE — Telephone Encounter (Signed)
Patient called the [facility] office stating that she doesn't want to wait on a HST since they are booked out. Wants to know if she can have her sleep study done at [facility].   Dr. Halford Chessman please advise

## 2021-03-28 NOTE — Telephone Encounter (Signed)
Can you please let patient know she has to have an in person visit for diabetic shoe prescription to be sent to the apothecary  Please schedule

## 2021-03-28 NOTE — Telephone Encounter (Signed)
Patient has an apt with PCP tomorrow on 10/20

## 2021-03-29 ENCOUNTER — Ambulatory Visit (INDEPENDENT_AMBULATORY_CARE_PROVIDER_SITE_OTHER): Payer: Medicare Other | Admitting: Pharmacist

## 2021-03-29 ENCOUNTER — Encounter: Payer: Self-pay | Admitting: Family Medicine

## 2021-03-29 ENCOUNTER — Ambulatory Visit (INDEPENDENT_AMBULATORY_CARE_PROVIDER_SITE_OTHER): Payer: Medicare Other | Admitting: Family Medicine

## 2021-03-29 ENCOUNTER — Other Ambulatory Visit: Payer: Self-pay

## 2021-03-29 VITALS — BP 124/73 | HR 89 | Temp 97.7°F | Ht 69.0 in | Wt 178.6 lb

## 2021-03-29 DIAGNOSIS — E11649 Type 2 diabetes mellitus with hypoglycemia without coma: Secondary | ICD-10-CM | POA: Diagnosis not present

## 2021-03-29 DIAGNOSIS — Z0001 Encounter for general adult medical examination with abnormal findings: Secondary | ICD-10-CM

## 2021-03-29 DIAGNOSIS — E1142 Type 2 diabetes mellitus with diabetic polyneuropathy: Secondary | ICD-10-CM

## 2021-03-29 DIAGNOSIS — F4310 Post-traumatic stress disorder, unspecified: Secondary | ICD-10-CM

## 2021-03-29 DIAGNOSIS — N393 Stress incontinence (female) (male): Secondary | ICD-10-CM

## 2021-03-29 DIAGNOSIS — J302 Other seasonal allergic rhinitis: Secondary | ICD-10-CM

## 2021-03-29 DIAGNOSIS — Z0000 Encounter for general adult medical examination without abnormal findings: Secondary | ICD-10-CM | POA: Diagnosis not present

## 2021-03-29 DIAGNOSIS — Z23 Encounter for immunization: Secondary | ICD-10-CM | POA: Diagnosis not present

## 2021-03-29 DIAGNOSIS — H9190 Unspecified hearing loss, unspecified ear: Secondary | ICD-10-CM | POA: Diagnosis not present

## 2021-03-29 DIAGNOSIS — Z79899 Other long term (current) drug therapy: Secondary | ICD-10-CM | POA: Diagnosis not present

## 2021-03-29 DIAGNOSIS — F429 Obsessive-compulsive disorder, unspecified: Secondary | ICD-10-CM

## 2021-03-29 DIAGNOSIS — I10 Essential (primary) hypertension: Secondary | ICD-10-CM

## 2021-03-29 DIAGNOSIS — N183 Chronic kidney disease, stage 3 unspecified: Secondary | ICD-10-CM

## 2021-03-29 DIAGNOSIS — F3164 Bipolar disorder, current episode mixed, severe, with psychotic features: Secondary | ICD-10-CM

## 2021-03-29 MED ORDER — GLIPIZIDE ER 5 MG PO TB24: ORAL_TABLET | ORAL | 1 refills | Status: DC

## 2021-03-29 MED ORDER — ATORVASTATIN CALCIUM 40 MG PO TABS: 40.0000 mg | ORAL_TABLET | Freq: Every day | ORAL | 1 refills | Status: DC

## 2021-03-29 MED ORDER — MONTELUKAST SODIUM 10 MG PO TABS: ORAL_TABLET | ORAL | 1 refills | Status: DC

## 2021-03-29 MED ORDER — SITAGLIPTIN PHOSPHATE 100 MG PO TABS: 100.0000 mg | ORAL_TABLET | Freq: Every day | ORAL | 1 refills | Status: DC

## 2021-03-29 MED ORDER — LEVOCETIRIZINE DIHYDROCHLORIDE 5 MG PO TABS: 5.0000 mg | ORAL_TABLET | Freq: Every evening | ORAL | 1 refills | Status: DC

## 2021-03-29 MED ORDER — LISINOPRIL 5 MG PO TABS: 5.0000 mg | ORAL_TABLET | Freq: Every day | ORAL | 1 refills | Status: DC

## 2021-03-29 MED ORDER — PREGABALIN 150 MG PO CAPS: 150.0000 mg | ORAL_CAPSULE | Freq: Two times a day (BID) | ORAL | 1 refills | Status: DC

## 2021-03-29 MED ORDER — OXYBUTYNIN CHLORIDE ER 10 MG PO TB24: ORAL_TABLET | ORAL | 1 refills | Status: DC

## 2021-03-29 NOTE — Telephone Encounter (Signed)
ATC patient. LMTCB with [place] office number.

## 2021-03-29 NOTE — Progress Notes (Signed)
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) DISCONTINUED FARXIGA DUE TO RECURRENT UTI/YEAST INFECTIONS DESPITE CONTROLLED SUGARS CONTINUE JANUVIA AND GLIPIZIDE  May consider rybelsus (in place of Januvia) at follow up--once libre data available Current glucose readings: fasting glucose: <180, post prandial glucose: 200s Patient received her Libre 2 in the [place] Denies hypoglycemic/hyperglycemic symptoms Discussed meal planning options and Plate method for healthy eating--Patient admits to not eating wisely, she is trying to work on this Avoid sugary drinks and desserts Incorporate balanced protein, non starchy veggies, 1 serving of carbohydrate with each meal Increase water intake Increase physical activity as able Current exercise: active job  Educated on current medications, patient on pill packaging with [facility]--discussed JANUVIA Recommended may switch to GLP1 (rybelsus), LIBRE Continue glipizide (with dinner), CONTINUE JANUVIA  Follow Up Plan: Telephone follow up appointment with care management team member scheduled for: 1 month  Subjective: Cynthia Deleon is an 65 y.o. year old female who is a primary patient of Loman Brooklyn, FNP.  The CCM team was consulted for assistance with disease management and care coordination needs.    Engaged with patient by telephone for follow up visit in response to provider referral for pharmacy case management and/or care coordination services.   Consent to Services:  The patient was given information about Chronic Care Management services, agreed to services, and gave verbal consent prior to initiation of services.  Please see initial visit note for detailed  documentation.   Patient Care Team: Loman Brooklyn, FNP as PCP - General (Family Medicine) Suella Broad, MD as Consulting Physician (Physical Medicine and Rehabilitation) Norma Fredrickson, MD as Consulting Physician (Psychiatry) Ilean China, RN as Registered Nurse Phillips Odor, MD as Consulting Physician (Neurology) Lavera Guise, [facility] (Pharmacist) Warren Danes, PA-C as Physician Assistant (Dermatology) Objective:  Lab Results  Component Value Date   CREATININE 0.84 10/10/2020   CREATININE 0.72 06/08/2020   CREATININE 0.75 03/07/2020    Lab Results  Component Value Date   HGBA1C 7.4 (H) 03/08/2021   Last diabetic Eye exam:  Lab Results  Component Value Date/Time   HMDIABEYEEXA No Retinopathy 07/25/2020 12:00 AM    Last diabetic Foot exam: No results found for: HMDIABFOOTEX      Component Value Date/Time   CHOL 116 10/10/2020 0954   TRIG 72 10/10/2020 0954   HDL 40 10/10/2020 0954   CHOLHDL 2.9 10/10/2020 0954   CHOLHDL 4.1 11/16/2015 0715   VLDL 48 (H) 11/16/2015 0715   LDLCALC 61 10/10/2020 0954    Hepatic Function Latest Ref Rng & Units 10/10/2020 03/07/2020 12/02/2019  Total Protein 6.0 - 8.5 g/dL 6.7 6.3 6.3  Albumin 3.8 - 4.8 g/dL 5.1(H) 4.6 4.4  AST 0 - 40 IU/L _0 ALT 0 - 32 IU/L _1 Alk Phosphatase 44 - 121 IU/L 55 68 84  Total Bilirubin 0.0 - 1.2 mg/dL 0.7 0.4 0.3  Bilirubin, Direct 0.1 - 0.5 mg/dL - - -    Lab Results  Component Value Date/Time   TSH 1.220 07/04/2020 09:58 AM   TSH 1.060 03/07/2020 08:52 AM    CBC Latest Ref Rng & Units 10/10/2020 06/08/2020 12/02/2019  WBC 3.4 - 10.8 x10E3/uL 6.5 11.0(H) 7.8  Hemoglobin 11.1 - 15.9 g/dL 15.5 15.8  15.2  Hematocrit 34.0 - 46.6 % 44.5 45.7 43.6  Platelets 150 - 450 x10E3/uL 267 201 214    No results found for: VD25OH  Clinical ASCVD: No  The ASCVD Risk score (Arnett DK, et al., 2019) failed to calculate for the following reasons:   The valid total cholesterol range is 130  to 320 mg/dL    Other: (CHADS2VASc if Afib, PHQ9 if depression, MMRC or CAT for COPD, ACT, DEXA)  Social History   Tobacco Use  Smoking Status Former   Packs/day: 0.50   Years: 35.00   Pack years: 17.50   Types: Cigarettes   Quit date: 05/11/2019   Years since quitting: 1.8  Smokeless Tobacco Never   BP Readings from Last 3 Encounters:  03/15/21 132/78  02/20/21 110/60  01/02/21 113/71   Pulse Readings from Last 3 Encounters:  03/15/21 74  02/20/21 84  01/02/21 90   Wt Readings from Last 3 Encounters:  03/15/21 179 lb (81.2 kg)  02/20/21 178 lb (80.7 kg)  01/02/21 178 lb (80.7 kg)    Assessment: Review of patient past medical history, allergies, medications, health status, including review of consultants reports, laboratory and other test data, was performed as part of comprehensive evaluation and provision of chronic care management services.   SDOH:  (Social Determinants of Health) assessments and interventions performed:    CCM Care Plan  Allergies  Allergen Reactions   Chantix [Varenicline] Other (See Comments)    Altered mental status-Per patient was put on allergy list by Dr Lorriane Shire bu patient staes she is not allergic to this and is currently taking it.    Divalproex Sodium Other (See Comments)    Hallucinations  Other reaction(s): Confusion   Other Anaphylaxis   Pseudoeph-Hydrocodone-Gg Nausea Only   Sulfa Antibiotics Anaphylaxis and Nausea Only   Varenicline Tartrate Other (See Comments)    Other reaction(s): Confusion   Ambien [Zolpidem] Other (See Comments)    Causes sleep walking   Clavulanic Acid Diarrhea   Codeine Nausea And Vomiting   Elemental Sulfur Other (See Comments)    Swelling of tongue   Hydrocodone Nausea And Vomiting   Macrobid [Nitrofurantoin] Nausea And Vomiting    Dizziness, nausea, vomiting   Paxil [Paroxetine Hcl] Other (See Comments)    Causes ringing in the ears.    Amoxicillin Rash    Has patient had a PCN reaction  causing immediate rash, facial/tongue/throat swelling, SOB or lightheadedness with hypotension: No Has patient had a PCN reaction causing severe rash involving mucus membranes or skin necrosis: No Has patient had a PCN reaction that required hospitalization: No Has patient had a PCN reaction occurring within the last 10 years: Yes If all of the above answers are "NO", then may proceed with Cephalosporin use.    Farxiga [Dapagliflozin]     RECURRENT YEAST/UTI   Metformin And Related Diarrhea   Tape Rash    Medications Reviewed Today     Reviewed by Chesley Mires, MD (Physician) on 03/15/21 at [place] List Status: <None>   Medication Order Taking? Sig Documenting Provider Last Dose Status Informant  albuterol (VENTOLIN HFA) 108 (90 Base) MCG/ACT inhaler [id number] Yes Inhale 2 puffs into the lungs every 6 (six) hours as needed. Loman Brooklyn, FNP Taking Active   ARIPiprazole (ABILIFY) 10 MG tablet [id number] Yes Take 10 mg by mouth daily. [provider] Taking Active   atorvastatin (LIPITOR) 40 MG tablet [id number] Yes Take 1 tablet (40 mg total) by mouth  daily. Loman Brooklyn, FNP Taking Active   azelastine (ASTELIN) 0.1 % nasal spray [id number] Yes Place 1 spray into both nostrils 2 (two) times daily. Use in each nostril as directed Chesley Mires, MD Taking Active   BELSOMRA 20 MG TABS [id number] Yes Take 1 tablet by mouth at bedtime as needed. [provider] Taking Active   benzonatate (TESSALON) 200 MG capsule [id number] Yes Take 1 capsule (200 mg total) by mouth 3 (three) times daily as needed for cough. Chesley Mires, MD Taking Active   budesonide-formoterol [facility]) 160-4.5 MCG/ACT inhaler [id number] Yes Inhale 2 puffs into the lungs in the morning and at bedtime. Chesley Mires, MD Taking Active   Continuous Blood Gluc Sensor (FREESTYLE LIBRE 2 SENSOR) [place] [id number] Yes by Does not apply route. [provider] Taking Active            Med Note Blanca Friend,  Sherian Maroon Mar 08, 2021 10:31 AM) SENT TO [employer]V DIABETES SUPPLY FOR APPROVAL  cyclobenzaprine (FLEXERIL) 10 MG tablet [id number] Yes  [provider] Taking Active   diazepam (VALIUM) 5 MG tablet [id number] Yes Take 5 mg by mouth in the morning and at bedtime.  [provider] Taking Active   fluconazole (DIFLUCAN) 100 MG tablet [id number] Yes Take 1 tablet (100 mg total) by mouth daily. Chesley Mires, MD Taking Active   fluticasone [facility]) 50 MCG/ACT nasal spray [id number] Yes Place 2 sprays into both nostrils daily. Loman Brooklyn, FNP Taking Active   glipiZIDE (GLUCOTROL XL) 5 MG 24 hr tablet [id number] Yes TAKE (1) TABLET BY MOUTH ONCE DAILY WITH EVENING MEAL. Loman Brooklyn, FNP Taking Active   glucose blood (CONTOUR NEXT TEST) test strip [id number] Yes Test BS 4 times daily Dx E11.9 Loman Brooklyn, FNP Taking Active   hydrocortisone cream 0.5 % [id number] Yes Apply 1 application topically 2 (two) times daily. Ivy Lynn, NP Taking Active   lamoTRIgine (LAMICTAL) 200 MG tablet [id number] Yes Take 200 mg by mouth at bedtime. For mood disorder. [provider] Taking Active   lansoprazole (PREVACID) 30 MG capsule [id number] Yes Take 1 capsule (30 mg total) by mouth daily at 12 noon. Loman Brooklyn, FNP Taking Active   levocetirizine (XYZAL) 5 MG tablet [id number] Yes Take 1 tablet (5 mg total) by mouth every evening. Loman Brooklyn, FNP Taking Active   lisinopril (ZESTRIL) 5 MG tablet [id number] Yes TAKE (1) TABLET BY MOUTH ONCE DAILY. Loman Brooklyn, FNP Taking Active   meclizine (ANTIVERT) 25 MG tablet [id number] Yes Take 2 tablets (50 mg total) by mouth daily as needed for dizziness. Loman Brooklyn, FNP Taking Active   montelukast (SINGULAIR) 10 MG tablet [id number] Yes TAKE (1) TABLET BY MOUTH AT BEDTIME. Loman Brooklyn, FNP Taking Active   mupirocin ointment (BACTROBAN) 2 % [id number] Yes Apply 1 application topically 2 (two) times daily. Warren Danes,  PA-C Taking Active   nystatin (MYCOSTATIN) 100000 UNIT/ML suspension [id number] Yes Take 5 mLs (500,000 Units total) by mouth 4 (four) times daily. Sharion Balloon, FNP Taking Active   ondansetron (ZOFRAN ODT) 4 MG disintegrating tablet [id number] Yes Take 1 tablet (4 mg total) by mouth every 8 (eight) hours as needed for nausea or vomiting. Loman Brooklyn, FNP Taking Active   [facility] Lancets 61P MISC [id number] Yes Use to test blood sugar twice daily. DX: E11.9 Loman Brooklyn, FNP Taking Active   oxybutynin (DITROPAN-XL) 10 MG 24 hr tablet [id number] Yes  TAKE (1) TABLET BY MOUTH AT BEDTIME. Loman Brooklyn, FNP Taking Active   oxyCODONE (ROXICODONE) 15 MG immediate release tablet [id number] Yes Take 7.5 mg by mouth every 6 (six) hours as needed.  [provider] Taking Active Self           Med Note Rulon Abide Apr 22, 2018  3:54 PM)    pregabalin (LYRICA) 150 MG capsule [id number] Yes Take 1 capsule (150 mg total) by mouth 2 (two) times daily. Loman Brooklyn, FNP Taking Active   PREMARIN 0.3 MG tablet [id number] Yes TAKE (1) TABLET BY MOUTH ONCE DAILY. Loman Brooklyn, FNP Taking Active   sitaGLIPtin (JANUVIA) 100 MG tablet [id number] Yes Take 1 tablet (100 mg total) by mouth daily. Loman Brooklyn, FNP Taking Active   temazepam (RESTORIL) 15 MG capsule [id number] Yes Take 15 mg by mouth at bedtime. [provider] Taking Active            Med Note Redmond Baseman Mar 20, 2020  2:30 PM) 03/20/20 - prescribed but not started yet  traZODone (DESYREL) 100 MG tablet [id number] Yes Take 100 mg by mouth at bedtime. [provider] Taking Active   valACYclovir (VALTREX) 500 MG tablet [id number] Yes Take 1 tablet (500 mg total) by mouth daily as needed (shingles flare up). Gwenlyn Perking, FNP Taking Active   Med List Note Loman Brooklyn, [place] 12/04/19 1526): Not Rx'd by PCP: sertraline, valium, lamotrigine, belsomra, and oxycodone - [facility] 12/02/19.             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   Diabetes mellitus with stage 3 chronic kidney disease ([place]) 07/06/2018   Bilateral temporomandibular joint pain 01/21/2018   OSA (obstructive sleep apnea) 01/21/2018   Vertigo 01/21/2018   DDD (degenerative disc disease), cervical 07/17/2017   Degeneration of lumbar intervertebral disc 07/17/2017   OCD (obsessive compulsive disorder) 11/15/2015   Tobacco use disorder 11/15/2015   Current use of estrogen therapy 01/12/2014   PTSD (post-traumatic stress disorder) 08/03/2011   Bipolar I disorder, most recent episode mixed, severe with psychotic features ([place])     Immunization History  Administered Date(s) Administered   Fluad Quad(high Dose 65+) 03/08/2021   Influenza Split 03/26/2016, 02/17/2019   Influenza,inj,Quad PF,6+ Mos 02/18/2017, 02/17/2019   Influenza,inj,quad, With Preservative 04/03/2015, 04/10/2018, 02/17/2019   Influenza-Unspecified 03/10/2020   Moderna SARS-COV2 Booster Vaccination 06/07/2020   Moderna Sars-Covid-2 Vaccination 08/19/2019, 09/23/2019   Pneumococcal Conjugate-13 10/01/2017   Pneumococcal Polysaccharide-23 04/29/2007   Tdap 11/20/2017    Conditions to be addressed/monitored: DMII  Care Plan : PHARMD MEDICATION MANAGEMENT  Updates made by Lavera Guise, [place] since 03/29/2021 12:00 AM     Problem: DISEASE PROGRESSION PREVENTION      Long-Range Goal: T2DM   Recent Progress: Not on track  Priority: High  Note:   Current Barriers:  Unable to independently  afford treatment regimen Unable to achieve control of T2DM   Pharmacist Clinical Goal(s):  Over the next 90 days, patient will  verbalize ability to afford treatment regimen achieve control of T2DM as evidenced by IMPROVED GLYCEMIC CONTROL  through collaboration with PharmD and provider.   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) DISCONTINUED FARXIGA DUE TO RECURRENT UTI/YEAST INFECTIONS DESPITE CONTROLLED SUGARS CONTINUE JANUVIA AND GLIPIZIDE  May consider rybelsus at follow up--once libre data available Current glucose readings: fasting glucose: <180, post prandial glucose: 200s Patient received her Libre 2 in the [place] Denies hypoglycemic/hyperglycemic symptoms Discussed meal planning options and Plate method for healthy eating--Patient admits to not eating wisely, she is trying to work on this Avoid sugary drinks and desserts Incorporate balanced protein, non starchy veggies, 1 serving of carbohydrate with each meal Increase water intake Increase physical activity as able Current exercise: active job  Educated on current medications, patient on pill packaging with [facility]--discussed JANUVIA Recommended may switch to GLP1 (rybelsus), LIBRE Continue glipizide (with dinner), [place]  Patient Goals/Self-Care Activities Over the next 90 days, patient will:  - take medications as prescribed check glucose daily (fasting) or if symptomatic, document, and provide at future appointments  Follow Up Plan: Telephone follow up  appointment with care management team member scheduled for: 1 month      Medication Assistance: None required.  Patient affirms current coverage meets needs.  Patient's preferred pharmacy is:  [facility], [place] [address] [place] [address] Phone: [phone] Fax: [phone]   Follow Up:  Patient agrees to Care Plan and Follow-up.  Plan: Telephone follow up appointment with care management team member scheduled for:  1 MONTH  Regina Eck, PharmD, BCPS Clinical Pharmacist, [place]  II Phone [phone]

## 2021-03-29 NOTE — Progress Notes (Signed)
Assessment & Plan:  1. Well adult exam Preventive health education provided.  - CBC with Differential/Platelet - CMP14+EGFR - Lipid panel  2. Type 2 diabetes mellitus with hypoglycemia without coma, without long-term current use of insulin (HCC) Lab Results  Component Value Date   HGBA1C 7.4 (H) 03/08/2021   HGBA1C 6.4 10/10/2020   HGBA1C 7.4 (H) 06/08/2020    - Diabetes is not at goal of A1c < 7. - Medications: continue current medications. No changes made today. - Home glucose monitoring: freestyle libre set up in office today. Encouraged patient to bring reader to every visit with myself or Cynthia Deleon (our clinical pharmacist).  - Patient is currently taking a statin. Patient is taking an ACE-inhibitor/ARB.  - Instruction/counseling given: discussed foot care  Diabetes Health Maintenance Due  Topic Date Due   OPHTHALMOLOGY EXAM  07/25/2021   HEMOGLOBIN A1C  09/05/2021   FOOT EXAM  03/29/2022    Lab Results  Component Value Date   LABMICR See below: 10/10/2020   LABMICR See below: 08/09/2020   - atorvastatin (LIPITOR) 40 MG tablet; Take 1 tablet (40 mg total) by mouth daily.  Dispense: 90 tablet; Refill: 1 - glipiZIDE (GLUCOTROL XL) 5 MG 24 hr tablet; TAKE (1) TABLET BY MOUTH ONCE DAILY WITH EVENING MEAL.  Dispense: 90 tablet; Refill: 1 - lisinopril (ZESTRIL) 5 MG tablet; Take 1 tablet (5 mg total) by mouth daily.  Dispense: 90 tablet; Refill: 1 - sitaGLIPtin (JANUVIA) 100 MG tablet; Take 1 tablet (100 mg total) by mouth daily.  Dispense: 90 tablet; Refill: 1 - CBC with Differential/Platelet - CMP14+EGFR - Lipid panel - Pneumococcal conjugate vaccine 20-valent (Prevnar 20) - For home use only DME Other see comment - DIABETIC SHOES  3.-4 Polyneuropathy due to type 2 diabetes mellitus (HCC)/Controlled substance agreement signed Well controlled on current regimen. Controlled substance agreement in place. Urine drug screen as expected. PDMP reviewed with no concerning findings.   - pregabalin (LYRICA) 150 MG capsule; Take 1 capsule (150 mg total) by mouth 2 (two) times daily.  Dispense: 180 capsule; Refill: 1 - CMP14+EGFR - For home use only DME Other see comment - DIABETIC SHOES  5. Essential hypertension Well controlled on current regimen.  - lisinopril (ZESTRIL) 5 MG tablet; Take 1 tablet (5 mg total) by mouth daily.  Dispense: 90 tablet; Refill: 1 - CBC with Differential/Platelet - CMP14+EGFR - Lipid panel  6. Seasonal allergies Well controlled on current regimen.  - levocetirizine (XYZAL) 5 MG tablet; Take 1 tablet (5 mg total) by mouth every evening.  Dispense: 90 tablet; Refill: 1 - montelukast (SINGULAIR) 10 MG tablet; TAKE (1) TABLET BY MOUTH AT BEDTIME.  Dispense: 90 tablet; Refill: 1  7. Stress incontinence of urine Well controlled on current regimen.  - oxybutynin (DITROPAN-XL) 10 MG 24 hr tablet; TAKE (1) TABLET BY MOUTH AT BEDTIME.  Dispense: 90 tablet; Refill: 1 - CMP14+EGFR  8. Hearing loss, unspecified hearing loss type, unspecified laterality Encouraged to reach back out to specialist that told her she needed hearing aids to determine if they provide or if she needs to go elsewhere.  9-11. Bipolar I disorder, most recent episode mixed, severe with psychotic features (HCC)/Obsessive-compulsive disorder, unspecified type/PTSD (post-traumatic stress disorder) Managed by psychiatry.  - CMP14+EGFR  12. Immunization due - Varicella-zoster vaccine IM (Shingrix) - Pneumococcal conjugate vaccine 20-valent (Prevnar 20)   Follow-up: Return in about 3 months (around 06/29/2021) for follow-up of chronic medication conditions (30 minute spot).   Cynthia Crater, NP Student  I personally was present during the history, physical exam, and medical decision-making activities of this service and have verified that the service and findings are accurately documented in the nurse practitioner student's note.  Cynthia Limes, MSN, APRN, FNP-C [facility]   Subjective:  [name], female    DOB: [birth year]  Age: 65 y.o. MRN: [id number]  Patient Care Team: Cynthia Brooklyn, FNP as PCP - General (Family Medicine) Cynthia Broad, MD as Consulting Physician (Physical Medicine and Rehabilitation) Cynthia Fredrickson, MD as Consulting Physician (Psychiatry) Cynthia China, RN as Registered Nurse Cynthia Odor, MD as Consulting Physician (Neurology) Lavera Deleon, [facility] (Pharmacist) Cynthia Danes, PA-C as Physician Assistant (Dermatology)   CC:  Chief Complaint  Patient presents with   Annual Exam    HPI Cynthia Deleon presents for annual physical and follow up for chronic medical conditions.   Health Maintenance: Occupation: retired, volunteers at [employer], Marital status: divorced, Substance use: none Diet: tries to eat a healthy diet, Exercise: walking, does a lot of volunteer work Last eye exam: 07/25/20, [facility] in [place], Cynthia Deleon Last dental exam: not recently, practices good oral hygeine Last colonoscopy: 04/2020 with recommended repeat in 5 years Last mammogram: 04/2020 DEXA: never - unable to perform today as technician is not here Hepatitis C Screening: declined Immunizations: Flu Vaccine: up to date Tdap Vaccine: up to date  Shingrix Vaccine: will get today COVID-19 Vaccine: up to date Pneumonia Vaccine: will get today  Advanced Directives Patient does have advanced directives including living will. She does not have a copy in the electronic medical record.   Diabetes: Patient presents for follow up of diabetes. Current symptoms include: hyperglycemia and paresthesia of the feet. Known diabetic complications: peripheral neuropathy and cardiovascular disease. Medication compliance: yes. Current diet: in general, an "unhealthy" diet. Current exercise: walking. Home blood sugar records:  needs assistance setting up her new libre today . Is she  on ACE inhibitor  or angiotensin II receptor blocker? Yes. Is she on a statin? Yes. She is requesting diabetic shoes.   Bipolar/PTSD/OCD She is managed by psychiatrist, Dr. Casimiro Needle.  Depression screen [facility] 2/9 03/29/2021 12/18/2020 10/25/2020  Decreased Interest '1 2 2  ' Down, Depressed, Hopeless '1 2 2  ' PHQ - 2 Score '2 4 4  ' Altered sleeping '3 3 3  ' Tired, decreased energy 2 2 0  Change in appetite 3 0 2  Feeling bad or failure about yourself  '1 3 2  ' Trouble concentrating 0 3 3  Moving slowly or fidgety/restless 3 0 0  Suicidal thoughts 0 0 0  PHQ-9 Score '14 15 14  ' Difficult doing work/chores Not difficult at all Very difficult Not difficult at all  Some recent data might be hidden   GAD 7 : Generalized Anxiety Score 03/29/2021 10/25/2020 07/04/2020 06/20/2020  Nervous, Anxious, on Edge '3 3 2 3  ' Control/stop worrying '3 3 3 1  ' Worry too much - different things '3 3 3 2  ' Trouble relaxing '3 3 1 3  ' Restless 3 3 0 3  Easily annoyed or irritable 0 '2 1 1  ' Afraid - awful might happen 0 3 1 0  Total GAD 7 Score '15 20 11 13  ' Anxiety Difficulty Not difficult at all Not difficult at all Not difficult at all Not difficult at all   Chronic venous insufficiency:  Established with a vascular specialist who recommended compression stockings to help with edema.    Review of  Systems  Constitutional:  Positive for weight loss (intentional). Negative for chills, fever and malaise/fatigue.  HENT:  Positive for congestion (recent bronchitis), hearing loss and sore throat. Negative for ear discharge, ear pain and sinus pain.   Eyes:  Positive for redness (intermittent, worse with no sleep). Negative for blurred vision, pain and discharge.  Respiratory:  Positive for cough and shortness of breath. Negative for wheezing.   Cardiovascular:  Positive for leg swelling (right greater than left after lots of walking). Negative for chest pain, palpitations and orthopnea.  Gastrointestinal:  Positive for nausea (during vertigo  episodes). Negative for abdominal pain, constipation, diarrhea, heartburn and vomiting.  Genitourinary: Negative.  Negative for dysuria, frequency and urgency.  Musculoskeletal: Negative.  Negative for back pain, falls, joint pain, myalgias and neck pain.  Skin: Negative.  Negative for itching and rash.  Neurological: Negative.  Negative for dizziness, tremors, weakness and headaches.  Endo/Heme/Allergies:  Positive for environmental allergies (pet dander). Negative for polydipsia. Does not bruise/bleed easily.  Psychiatric/Behavioral: Negative.  Negative for depression, memory loss and substance abuse. The patient is not nervous/anxious and does not have insomnia.     Current Outpatient Medications:    albuterol (VENTOLIN HFA) 108 (90 Base) MCG/ACT inhaler, Inhale 2 puffs into the lungs every 6 (six) hours as needed., Disp: 18 g, Rfl: 2   ARIPiprazole (ABILIFY) 10 MG tablet, Take 10 mg by mouth daily., Disp: , Rfl:    azelastine (ASTELIN) 0.1 % nasal spray, Place 1 spray into both nostrils 2 (two) times daily. Use in each nostril as directed, Disp: 30 mL, Rfl: 12   BELSOMRA 20 MG TABS, Take 1 tablet by mouth at bedtime as needed., Disp: , Rfl:    budesonide-formoterol (SYMBICORT) 160-4.5 MCG/ACT inhaler, Inhale 2 puffs into the lungs in the morning and at bedtime., Disp: 1 each, Rfl: 6   Continuous Blood Gluc Sensor (FREESTYLE LIBRE 2 SENSOR) MISC, by Does not apply route., Disp: , Rfl:    cyclobenzaprine (FLEXERIL) 10 MG tablet, , Disp: , Rfl:    diazepam (VALIUM) 5 MG tablet, Take 5 mg by mouth in the morning and at bedtime. , Disp: , Rfl:    fluticasone (FLONASE) 50 MCG/ACT nasal spray, Place 2 sprays into both nostrils daily., Disp: 16 g, Rfl: 5   glucose blood (CONTOUR NEXT TEST) test strip, Test BS 4 times daily Dx E11.9, Disp: 400 strip, Rfl: 3   guaiFENesin (MUCINEX) 600 MG 12 hr tablet, Take 2 tablets (1,200 mg total) by mouth 2 (two) times daily as needed for to loosen phlegm or  cough., Disp: , Rfl:    lamoTRIgine (LAMICTAL) 200 MG tablet, Take 200 mg by mouth at bedtime. For mood disorder., Disp: , Rfl:    lansoprazole (PREVACID) 30 MG capsule, Take 1 capsule (30 mg total) by mouth daily at 12 noon., Disp: 30 capsule, Rfl: 12   meclizine (ANTIVERT) 25 MG tablet, Take 2 tablets (50 mg total) by mouth daily as needed for dizziness., Disp: 60 tablet, Rfl: 2   ondansetron (ZOFRAN ODT) 4 MG disintegrating tablet, Take 1 tablet (4 mg total) by mouth every 8 (eight) hours as needed for nausea or vomiting., Disp: 20 tablet, Rfl: 0   OneTouch Delica Lancets 14G MISC, Use to test blood sugar twice daily. DX: E11.9, Disp: 100 each, Rfl: 3   oxyCODONE (ROXICODONE) 15 MG immediate release tablet, Take 7.5 mg by mouth every 6 (six) hours as needed. , Disp: , Rfl:    PREMARIN 0.3  MG tablet, TAKE (1) TABLET BY MOUTH ONCE DAILY., Disp: 30 tablet, Rfl: 0   temazepam (RESTORIL) 15 MG capsule, Take 15 mg by mouth at bedtime., Disp: , Rfl:    traZODone (DESYREL) 100 MG tablet, Take 100 mg by mouth at bedtime., Disp: , Rfl:    valACYclovir (VALTREX) 500 MG tablet, Take 1 tablet (500 mg total) by mouth daily as needed (shingles flare up)., Disp: 30 tablet, Rfl: 4   atorvastatin (LIPITOR) 40 MG tablet, Take 1 tablet (40 mg total) by mouth daily., Disp: 90 tablet, Rfl: 1   glipiZIDE (GLUCOTROL XL) 5 MG 24 hr tablet, TAKE (1) TABLET BY MOUTH ONCE DAILY WITH EVENING MEAL., Disp: 90 tablet, Rfl: 1   levocetirizine (XYZAL) 5 MG tablet, Take 1 tablet (5 mg total) by mouth every evening., Disp: 90 tablet, Rfl: 1   lisinopril (ZESTRIL) 5 MG tablet, Take 1 tablet (5 mg total) by mouth daily., Disp: 90 tablet, Rfl: 1   montelukast (SINGULAIR) 10 MG tablet, TAKE (1) TABLET BY MOUTH AT BEDTIME., Disp: 90 tablet, Rfl: 1   nystatin (MYCOSTATIN) 100000 UNIT/ML suspension, Take 5 mLs (500,000 Units total) by mouth 4 (four) times daily. (Patient not taking: Reported on 03/29/2021), Disp: 473 mL, Rfl: 0    oxybutynin (DITROPAN-XL) 10 MG 24 hr tablet, TAKE (1) TABLET BY MOUTH AT BEDTIME., Disp: 90 tablet, Rfl: 1   pregabalin (LYRICA) 150 MG capsule, Take 1 capsule (150 mg total) by mouth 2 (two) times daily., Disp: 180 capsule, Rfl: 1   sitaGLIPtin (JANUVIA) 100 MG tablet, Take 1 tablet (100 mg total) by mouth daily., Disp: 90 tablet, Rfl: 1  Allergies  Allergen Reactions   Chantix [Varenicline] Other (See Comments)    Altered mental status-Per patient was put on allergy list by Dr Lorriane Shire bu patient staes she is not allergic to this and is currently taking it.    Divalproex Sodium Other (See Comments)    Hallucinations  Other reaction(s): Confusion   Other Anaphylaxis   Pseudoeph-Hydrocodone-Gg Nausea Only   Sulfa Antibiotics Anaphylaxis and Nausea Only   Varenicline Tartrate Other (See Comments)    Other reaction(s): Confusion   Ambien [Zolpidem] Other (See Comments)    Causes sleep walking   Clavulanic Acid Diarrhea   Codeine Nausea And Vomiting   Elemental Sulfur Other (See Comments)    Swelling of tongue   Hydrocodone Nausea And Vomiting   Macrobid [Nitrofurantoin] Nausea And Vomiting    Dizziness, nausea, vomiting   Paxil [Paroxetine Hcl] Other (See Comments)    Causes ringing in the ears.    Amoxicillin Rash    Has patient had a PCN reaction causing immediate rash, facial/tongue/throat swelling, SOB or lightheadedness with hypotension: No Has patient had a PCN reaction causing severe rash involving mucus membranes or skin necrosis: No Has patient had a PCN reaction that required hospitalization: No Has patient had a PCN reaction occurring within the last 10 years: Yes If all of the above answers are "NO", then may proceed with Cephalosporin use.    Wilder Glade [Dapagliflozin]     RECURRENT YEAST/UTI   Metformin And Related Diarrhea   Tape Rash    Past Medical History:  Diagnosis Date   Arthritis    hands and knees   Bipolar 1 disorder (HCC)    Borderline diabetic     Chronic back pain    Chronic neck pain    Complication of anesthesia    high anxiety-does not want to be alone  COPD (chronic obstructive pulmonary disease) (HCC)    Current use of estrogen therapy 01/12/2014   Depression    Diabetes mellitus without complication (HCC)    Diplopia    Family history of adverse reaction to anesthesia    sister "gas in lungs"   GERD (gastroesophageal reflux disease)    Headache    Hot flashes 12/15/2013   Hypertension    IBS (irritable bowel syndrome)    Incontinence    Kidney stone    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[place])    Panic attacks    Peptic ulcer    Peripheral neuropathy    Rosacea    Shingles    Sleep apnea    uses a cpap-with oxygen    Past Surgical History:  Procedure Laterality Date   BIOPSY  04/27/2020   Procedure: BIOPSY;  Surgeon: Ronald Lobo, MD;  Location: [facility];  Service: Endoscopy;;   BUNIONECTOMY WITH HAMMERTOE RECONSTRUCTION Right 12/10/2012   Procedure: RIGHT FIRST METATARSAL CHEVRON BUNION CORRECTION,  2 AND 3 HAMMERTOE CORRECTION , RIGHT 3 AND 4 TOE NAIL EXCISION ;  Surgeon: Wylene Simmer, MD;  Location: [facility];  Service: Orthopedics;  Laterality: Right;   COLONOSCOPY WITH PROPOFOL N/A 04/27/2020   Procedure: COLONOSCOPY WITH PROPOFOL;  Surgeon: Ronald Lobo, MD;  Location: [facility];  Service: Endoscopy;  Laterality: N/A;   FOOT ARTHRODESIS  2000   both feet   LIPOSUCTION  03/2018   RECTAL SURGERY     TONSILLECTOMY     TOTAL ABDOMINAL HYSTERECTOMY      Family History  Problem Relation Age of Onset   Diabetes Mother    Other Mother        vertigo; chronic eye disease   COPD Father    COPD Sister    Alcohol abuse Brother    Diabetes Maternal Grandmother    Diabetes Maternal Grandfather    Diabetes Sister    Other Sister        hearing problems   Hyperlipidemia Sister    Alcohol abuse Brother    COPD Brother    Alcohol abuse Brother    Alcohol abuse Brother    Other Brother         aneursym    Social History   Socioeconomic History   Marital status: Divorced    Spouse name: Not on file   Number of children: 5   Years of education: 9th   Highest education level: Not on file  Occupational History   Occupation: Disabled  Tobacco Use   Smoking status: Former    Packs/day: 0.50    Years: 35.00    Pack years: 17.50    Types: Cigarettes    Quit date: 05/11/2019    Years since quitting: 1.8   Smokeless tobacco: Never  Vaping Use   Vaping Use: Never used  Substance and Sexual Activity   Alcohol use: No    Alcohol/week: 0.0 standard drinks   Drug use: No   Sexual activity: Never    Birth control/protection: Surgical    Comment: occ cigarette  Other Topics Concern   Not on file  Social History Narrative   Lives at home with home with her son (has five children).   Right-handed.   1 cup caffeine per day.   Social Determinants of Health   Financial Resource Strain: Not on file  Food Insecurity: Not on file  Transportation Needs: Not on file  Physical Activity: Not on file  Stress: Not on file  Social Connections: Not on file  Intimate Partner Violence: Not on file      Objective:    BP 124/73   Pulse 89   Temp 97.7 F (36.5 C) (Temporal)   Ht '5\' 9"'  (1.753 m)   Wt 81 kg   SpO2 94%   BMI 26.37 kg/m   Wt Readings from Last 3 Encounters:  03/29/21 178 lb 9.6 oz (81 kg)  03/15/21 179 lb (81.2 kg)  02/20/21 178 lb (80.7 kg)    Physical Exam Vitals reviewed.  Constitutional:      General: She is not in acute distress.    Appearance: Normal appearance. She is overweight. She is not ill-appearing, toxic-appearing or diaphoretic.  HENT:     Head: Normocephalic and atraumatic.     Right Ear: Tympanic membrane, ear canal and external ear normal.     Left Ear: Tympanic membrane, ear canal and external ear normal.     Nose: Nose normal. No congestion.     Mouth/Throat:     Mouth: Mucous membranes are moist.     Pharynx: Oropharynx is  clear. No oropharyngeal exudate or posterior oropharyngeal erythema.  Eyes:     Extraocular Movements: Extraocular movements intact.     Conjunctiva/sclera: Conjunctivae normal.     Pupils: Pupils are equal, round, and reactive to light.  Cardiovascular:     Rate and Rhythm: Normal rate and regular rhythm.     Pulses: Normal pulses.     Heart sounds: Normal heart sounds.  Pulmonary:     Effort: Pulmonary effort is normal. No respiratory distress.     Breath sounds: Normal breath sounds. No wheezing or rhonchi.  Abdominal:     General: Bowel sounds are normal. There is no distension.     Palpations: Abdomen is soft. There is no mass.  Musculoskeletal:        General: Normal range of motion.     Cervical back: Normal range of motion.  Skin:    General: Skin is warm and dry.     Capillary Refill: Capillary refill takes less than 2 seconds.  Neurological:     General: No focal deficit present.     Mental Status: She is alert and oriented to person, place, and time.     Motor: No weakness.     Gait: Gait normal.  Psychiatric:        Mood and Affect: Mood normal.        Behavior: Behavior normal.        Thought Content: Thought content normal.        Judgment: Judgment normal.   Diabetic Foot Form - Detailed   Diabetic Foot Exam - detailed Diabetic Foot exam was performed with the following findings: Yes 03/29/2021 12:23 PM  Visual Foot Exam completed.: Yes  Can the patient see the bottom of their feet?: Yes Are the shoes appropriate in style and fit?: Yes Is there swelling or and abnormal foot shape?: No Is there a claw toe deformity?: No Is there elevated skin temparature?: No Is there foot or ankle muscle weakness?: No Normal Range of Motion: Yes Pulse Foot Exam completed.: Yes   Right posterior Tibialias: Present Left posterior Tibialias: Present   Right Dorsalis Pedis: Present Left Dorsalis Pedis: Present  Sensory Foot Exam Completed.: Yes Semmes-Weinstein Monofilament  Test R Site 1-Great Toe: Neg L Site 1-Great Toe: Neg     Comments: Has sensation in areas 9, 7, & 10 bilaterally     Lab  Results  Component Value Date   TSH 1.220 07/04/2020   Lab Results  Component Value Date   WBC 6.5 10/10/2020   HGB 15.5 10/10/2020   HCT 44.5 10/10/2020   MCV 90 10/10/2020   PLT 267 10/10/2020   Lab Results  Component Value Date   NA 142 10/10/2020   K 4.8 10/10/2020   CO2 25 10/10/2020   GLUCOSE 86 10/10/2020   BUN 16 10/10/2020   CREATININE 0.84 10/10/2020   BILITOT 0.7 10/10/2020   ALKPHOS 55 10/10/2020   AST 18 10/10/2020   ALT 13 10/10/2020   PROT 6.7 10/10/2020   ALBUMIN 5.1 (H) 10/10/2020   CALCIUM 10.1 10/10/2020   ANIONGAP 8 04/22/2018   EGFR 78 10/10/2020   Lab Results  Component Value Date   CHOL 116 10/10/2020   Lab Results  Component Value Date   HDL 40 10/10/2020   Lab Results  Component Value Date   LDLCALC 61 10/10/2020   Lab Results  Component Value Date   TRIG 72 10/10/2020   Lab Results  Component Value Date   CHOLHDL 2.9 10/10/2020   Lab Results  Component Value Date   HGBA1C 7.4 (H) 03/08/2021

## 2021-03-29 NOTE — Telephone Encounter (Signed)
Okay to place order for split night sleep study at [facility] sleep lab.  Please let her know that her insurance might not cover an in lab sleep study.

## 2021-03-29 NOTE — Patient Instructions (Addendum)
[facility]   [address] [place], [place] [address]   [phone]    Wear compression stockings for your leg swelling, cramping, pain, etc. These should be applied first thing in the morning and removed at bedtime.

## 2021-03-29 NOTE — Patient Instructions (Signed)
Visit Information  PATIENT GOALS:  Goals Addressed               This Visit's Progress     Patient Stated     T2DM-PharmD (pt-stated)        Current Barriers:  Unable to independently afford treatment regimen Unable to achieve control of T2DM   Pharmacist Clinical Goal(s):  Over the next 90 days, patient will verbalize ability to afford treatment regimen achieve control of T2DM as evidenced by IMPROVED GLYCEMIC CONTROL through collaboration with PharmD and provider.   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) DISCONTINUED FARXIGA DUE TO RECURRENT UTI/YEAST INFECTIONS DESPITE CONTROLLED SUGARS CONTINUE JANUVIA AND GLIPIZIDE  May consider rybelsus at follow up--once libre data available Current glucose readings: fasting glucose: <180, post prandial glucose: 200s Patient received her Libre 2 in the [place] Denies hypoglycemic/hyperglycemic symptoms Discussed meal planning options and Plate method for healthy eating--Patient admits to not eating wisely, she is trying to work on this Avoid sugary drinks and desserts Incorporate balanced protein, non starchy veggies, 1 serving of carbohydrate with each meal Increase water intake Increase physical activity as able Current exercise: active job  Educated on current medications, patient on pill packaging with [facility]--discussed JANUVIA Recommended may switch to GLP1 (rybelsus), LIBRE Continue glipizide (with dinner), CONTINUE JANUVIA  Patient Goals/Self-Care Activities Over the next  90 days, patient will:  - take medications as prescribed check glucose daily (fasting) or if symptomatic, document, and provide at future appointments  Follow Up Plan: Telephone follow up appointment with care management team member scheduled for: 1 month         The patient verbalized understanding of instructions, educational materials, and care plan provided today and declined offer to receive copy of patient instructions, educational materials, and care plan.   Telephone follow up appointment with care management team member scheduled for: 1 month  Signature Regina Eck, PharmD, BCPS Clinical Pharmacist, [place]  [facility] Phone [phone]

## 2021-03-30 ENCOUNTER — Other Ambulatory Visit: Payer: Self-pay

## 2021-03-30 DIAGNOSIS — R0683 Snoring: Secondary | ICD-10-CM

## 2021-03-30 LAB — CBC WITH DIFFERENTIAL/PLATELET
Basophils Absolute: 0.1 10*3/uL (ref 0.0–0.2)
Basos: 1 %
EOS (ABSOLUTE): 0.1 10*3/uL (ref 0.0–0.4)
Eos: 1 %
Hematocrit: 48.1 % — ABNORMAL HIGH (ref 34.0–46.6)
Hemoglobin: 16.2 g/dL — ABNORMAL HIGH (ref 11.1–15.9)
Immature Grans (Abs): 0.1 10*3/uL (ref 0.0–0.1)
Immature Granulocytes: 1 %
Lymphocytes Absolute: 2.3 10*3/uL (ref 0.7–3.1)
Lymphs: 28 %
MCH: 31.6 pg (ref 26.6–33.0)
MCHC: 33.7 g/dL (ref 31.5–35.7)
MCV: 94 fL (ref 79–97)
Monocytes Absolute: 0.5 10*3/uL (ref 0.1–0.9)
Monocytes: 6 %
Neutrophils Absolute: 5.2 10*3/uL (ref 1.4–7.0)
Neutrophils: 63 %
Platelets: 204 10*3/uL (ref 150–450)
RBC: 5.12 x10E6/uL (ref 3.77–5.28)
RDW: 13.8 % (ref 11.7–15.4)
WBC: 8.2 10*3/uL (ref 3.4–10.8)

## 2021-03-30 LAB — CMP14+EGFR
ALT: 13 IU/L (ref 0–32)
AST: 20 IU/L (ref 0–40)
Albumin/Globulin Ratio: 2.6 — ABNORMAL HIGH (ref 1.2–2.2)
Albumin: 4.6 g/dL (ref 3.8–4.8)
Alkaline Phosphatase: 66 IU/L (ref 44–121)
BUN/Creatinine Ratio: 16 (ref 12–28)
BUN: 13 mg/dL (ref 8–27)
Bilirubin Total: 0.6 mg/dL (ref 0.0–1.2)
CO2: 24 mmol/L (ref 20–29)
Calcium: 10.2 mg/dL (ref 8.7–10.3)
Chloride: 102 mmol/L (ref 96–106)
Creatinine, Ser: 0.79 mg/dL (ref 0.57–1.00)
Globulin, Total: 1.8 g/dL (ref 1.5–4.5)
Glucose: 116 mg/dL — ABNORMAL HIGH (ref 70–99)
Potassium: 4.8 mmol/L (ref 3.5–5.2)
Sodium: 142 mmol/L (ref 134–144)
Total Protein: 6.4 g/dL (ref 6.0–8.5)
eGFR: 83 mL/min/{1.73_m2} (ref >59)

## 2021-03-30 LAB — LIPID PANEL
Chol/HDL Ratio: 3.3 ratio (ref 0.0–4.4)
Cholesterol, Total: 169 mg/dL (ref 100–199)
HDL: 52 mg/dL (ref >39)
LDL Chol Calc (NIH): 96 mg/dL (ref 0–99)
Triglycerides: 118 mg/dL (ref 0–149)
VLDL Cholesterol Cal: 21 mg/dL (ref 5–40)

## 2021-03-30 NOTE — Telephone Encounter (Signed)
Spoke to patient and let her know insurance may not cover an in lab sleep study. She voiced understanding and still wanted to proceed with sleep study at [facility]. Order placed nothing further needed at this time.

## 2021-04-02 ENCOUNTER — Encounter: Payer: Self-pay | Admitting: Family Medicine

## 2021-04-04 ENCOUNTER — Other Ambulatory Visit (HOSPITAL_COMMUNITY): Payer: Self-pay

## 2021-04-04 ENCOUNTER — Encounter (HOSPITAL_COMMUNITY): Payer: Self-pay

## 2021-04-04 ENCOUNTER — Other Ambulatory Visit: Payer: Self-pay | Admitting: Family Medicine

## 2021-04-04 DIAGNOSIS — E11649 Type 2 diabetes mellitus with hypoglycemia without coma: Secondary | ICD-10-CM

## 2021-04-04 DIAGNOSIS — Z79899 Other long term (current) drug therapy: Secondary | ICD-10-CM

## 2021-04-04 DIAGNOSIS — Z122 Encounter for screening for malignant neoplasm of respiratory organs: Secondary | ICD-10-CM

## 2021-04-04 DIAGNOSIS — N393 Stress incontinence (female) (male): Secondary | ICD-10-CM

## 2021-04-04 DIAGNOSIS — N952 Postmenopausal atrophic vaginitis: Secondary | ICD-10-CM

## 2021-04-04 DIAGNOSIS — Z79818 Long term (current) use of other agents affecting estrogen receptors and estrogen levels: Secondary | ICD-10-CM

## 2021-04-04 DIAGNOSIS — Z87891 Personal history of nicotine dependence: Secondary | ICD-10-CM

## 2021-04-04 DIAGNOSIS — E1142 Type 2 diabetes mellitus with diabetic polyneuropathy: Secondary | ICD-10-CM

## 2021-04-04 NOTE — Progress Notes (Signed)
Patient scheduled for follow-up LDCT on 04/17/21 at 0730

## 2021-04-09 DIAGNOSIS — N183 Chronic kidney disease, stage 3 unspecified: Secondary | ICD-10-CM | POA: Diagnosis not present

## 2021-04-09 DIAGNOSIS — E1122 Type 2 diabetes mellitus with diabetic chronic kidney disease: Secondary | ICD-10-CM | POA: Diagnosis not present

## 2021-04-15 ENCOUNTER — Encounter: Payer: Medicare Other | Admitting: Pulmonary Disease

## 2021-04-16 ENCOUNTER — Telehealth: Payer: Self-pay | Admitting: Family Medicine

## 2021-04-16 NOTE — Telephone Encounter (Signed)
Do you know what patient is referring to?

## 2021-04-16 NOTE — Telephone Encounter (Signed)
Diabetic shoes were ordered for her and the order was sent to them. I guess this means they need my office note. Please fax over to them.

## 2021-04-16 NOTE — Telephone Encounter (Signed)
Pt called to check on status of referral to see [facility] for her feet. Says she spoke with someone from [place] who told her that they are still waiting on provider to send notes where testing was done for her feet.   Please advise and call patient.

## 2021-04-16 NOTE — Telephone Encounter (Signed)
done

## 2021-04-17 ENCOUNTER — Ambulatory Visit (HOSPITAL_COMMUNITY): Admission: RE | Admit: 2021-04-17 | Payer: Medicare Other | Source: Ambulatory Visit

## 2021-04-18 ENCOUNTER — Encounter: Payer: Self-pay | Admitting: Family Medicine

## 2021-04-18 ENCOUNTER — Other Ambulatory Visit: Payer: Self-pay

## 2021-04-18 ENCOUNTER — Ambulatory Visit (INDEPENDENT_AMBULATORY_CARE_PROVIDER_SITE_OTHER): Payer: Medicare Other | Admitting: Family Medicine

## 2021-04-18 ENCOUNTER — Ambulatory Visit (INDEPENDENT_AMBULATORY_CARE_PROVIDER_SITE_OTHER): Payer: Medicare Other | Admitting: Pharmacist

## 2021-04-18 DIAGNOSIS — N3000 Acute cystitis without hematuria: Secondary | ICD-10-CM | POA: Diagnosis not present

## 2021-04-18 DIAGNOSIS — N183 Chronic kidney disease, stage 3 unspecified: Secondary | ICD-10-CM | POA: Diagnosis not present

## 2021-04-18 DIAGNOSIS — I10 Essential (primary) hypertension: Secondary | ICD-10-CM

## 2021-04-18 DIAGNOSIS — E1122 Type 2 diabetes mellitus with diabetic chronic kidney disease: Secondary | ICD-10-CM | POA: Diagnosis not present

## 2021-04-18 DIAGNOSIS — E11649 Type 2 diabetes mellitus with hypoglycemia without coma: Secondary | ICD-10-CM

## 2021-04-18 LAB — URINALYSIS, COMPLETE
Bilirubin, UA: NEGATIVE
Ketones, UA: NEGATIVE
Nitrite, UA: POSITIVE — AB
Protein,UA: NEGATIVE
Specific Gravity, UA: 1.015 (ref 1.005–1.030)
Urobilinogen, Ur: 0.2 mg/dL (ref 0.2–1.0)
pH, UA: 7 (ref 5.0–7.5)

## 2021-04-18 LAB — MICROSCOPIC EXAMINATION: Renal Epithel, UA: NONE SEEN /hpf

## 2021-04-18 MED ORDER — TRULICITY 1.5 MG/0.5ML ~~LOC~~ SOAJ: 1.5000 mg | SUBCUTANEOUS | 6 refills | Status: DC

## 2021-04-18 MED ORDER — FLUCONAZOLE 150 MG PO TABS: 150.0000 mg | ORAL_TABLET | Freq: Once | ORAL | 0 refills | Status: AC

## 2021-04-18 MED ORDER — CEPHALEXIN 500 MG PO CAPS: 500.0000 mg | ORAL_CAPSULE | Freq: Four times a day (QID) | ORAL | 0 refills | Status: DC

## 2021-04-18 NOTE — Progress Notes (Signed)
Chronic Care Management Pharmacy Note  04/18/2021 Name:  Cynthia Deleon MRN:  [id number] DOB:  Mar 31, 1956  Summary: T2DM ,HLD  Recommendations/Changes made from today's visit:  Plan:  Subjective: Cynthia Deleon is an 65 y.o. year old female who is a primary patient of Loman Brooklyn, FNP.  The CCM team was consulted for assistance with disease management and care coordination needs.    Engaged with patient face to face for follow up visit in response to provider referral for pharmacy case management and/or care coordination services.   Consent to Services:  The patient was given information about Chronic Care Management services, agreed to services, and gave verbal consent prior to initiation of services.  Please see initial visit note for detailed documentation.   Patient Care Team: Loman Brooklyn, FNP as PCP - General (Family Medicine) Suella Broad, MD as Consulting Physician (Physical Medicine and Rehabilitation) Norma Fredrickson, MD as Consulting Physician (Psychiatry) Ilean China, RN as Registered Nurse Phillips Odor, MD as Consulting Physician (Neurology) Lavera Guise, [facility] (Pharmacist) Warren Danes, PA-C as Physician Assistant (Dermatology) Madelin Headings, DO (Optometry)  Objective:  Lab Results  Component Value Date   CREATININE 0.79 03/29/2021   CREATININE 0.84 10/10/2020   CREATININE 0.72 06/08/2020    Lab Results  Component Value Date   HGBA1C 7.4 (H) 03/08/2021   Last diabetic Eye exam:  Lab Results  Component Value Date/Time   HMDIABEYEEXA No Retinopathy 07/25/2020 12:00 AM    Last diabetic Foot exam: No results found for: HMDIABFOOTEX      Component Value Date/Time   CHOL 169 03/29/2021 1237   TRIG 118 03/29/2021 1237   HDL 52 03/29/2021 1237   CHOLHDL 3.3 03/29/2021 1237   CHOLHDL 4.1 11/16/2015 0715   VLDL 48 (H) 11/16/2015 0715   LDLCALC 96 03/29/2021 1237    Hepatic Function Latest Ref Rng & Units 03/29/2021  10/10/2020 03/07/2020  Total Protein 6.0 - 8.5 g/dL 6.4 6.7 6.3  Albumin 3.8 - 4.8 g/dL 4.6 5.1(H) 4.6  AST 0 - 40 IU/L '20 18 11  ' ALT 0 - 32 IU/L '13 13 7  ' Alk Phosphatase 44 - 121 IU/L 66 55 68  Total Bilirubin 0.0 - 1.2 mg/dL 0.6 0.7 0.4  Bilirubin, Direct 0.1 - 0.5 mg/dL - - -    Lab Results  Component Value Date/Time   TSH 1.220 07/04/2020 09:58 AM   TSH 1.060 03/07/2020 08:52 AM    CBC Latest Ref Rng & Units 03/29/2021 10/10/2020 06/08/2020  WBC 3.4 - 10.8 x10E3/uL 8.2 6.5 11.0(H)  Hemoglobin 11.1 - 15.9 g/dL 16.2(H) 15.5 15.8  Hematocrit 34.0 - 46.6 % 48.1(H) 44.5 45.7  Platelets 150 - 450 x10E3/uL 204 267 201    No results found for: VD25OH  Clinical ASCVD: No  The 10-year ASCVD risk score (Arnett DK, et al., 2019) is: 12.3%   Values used to calculate the score:     Age: 45 years     Sex: Female     Is Non-Hispanic African American: No     Diabetic: Yes     Tobacco smoker: No     Systolic Blood Pressure: 656 mmHg     Is BP treated: Yes     HDL Cholesterol: 52 mg/dL     Total Cholesterol: 169 mg/dL    Other: (CHADS2VASc if Afib, PHQ9 if depression, MMRC or CAT for COPD, ACT, DEXA)  Social History   Tobacco Use  Smoking Status Former  Packs/day: 0.50   Years: 35.00   Pack years: 17.50   Types: Cigarettes   Quit date: 05/11/2019   Years since quitting: 1.9  Smokeless Tobacco Never   BP Readings from Last 3 Encounters:  03/29/21 124/73  03/15/21 132/78  02/20/21 110/60   Pulse Readings from Last 3 Encounters:  03/29/21 89  03/15/21 74  02/20/21 84   Wt Readings from Last 3 Encounters:  03/29/21 178 lb 9.6 oz (81 kg)  03/15/21 179 lb (81.2 kg)  02/20/21 178 lb (80.7 kg)    Assessment: Review of patient past medical history, allergies, medications, health status, including review of consultants reports, laboratory and other test data, was performed as part of comprehensive evaluation and provision of chronic care management services.   SDOH:  (Social  Determinants of Health) assessments and interventions performed:    CCM Care Plan  Allergies  Allergen Reactions   Chantix [Varenicline] Other (See Comments)    Altered mental status-Per patient was put on allergy list by Dr Lorriane Shire bu patient staes she is not allergic to this and is currently taking it.    Divalproex Sodium Other (See Comments)    Hallucinations  Other reaction(s): Confusion   Other Anaphylaxis   Pseudoeph-Hydrocodone-Gg Nausea Only   Sulfa Antibiotics Anaphylaxis and Nausea Only   Varenicline Tartrate Other (See Comments)    Other reaction(s): Confusion   Ambien [Zolpidem] Other (See Comments)    Causes sleep walking   Clavulanic Acid Diarrhea   Codeine Nausea And Vomiting   Elemental Sulfur Other (See Comments)    Swelling of tongue   Hydrocodone Nausea And Vomiting   Macrobid [Nitrofurantoin] Nausea And Vomiting    Dizziness, nausea, vomiting   Paxil [Paroxetine Hcl] Other (See Comments)    Causes ringing in the ears.    Amoxicillin Rash    Has patient had a PCN reaction causing immediate rash, facial/tongue/throat swelling, SOB or lightheadedness with hypotension: No Has patient had a PCN reaction causing severe rash involving mucus membranes or skin necrosis: No Has patient had a PCN reaction that required hospitalization: No Has patient had a PCN reaction occurring within the last 10 years: Yes If all of the above answers are "NO", then may proceed with Cephalosporin use.    Wilder Glade [Dapagliflozin]     RECURRENT YEAST/UTI   Metformin And Related Diarrhea   Tape Rash    Medications Reviewed Today     Reviewed by Lavera Guise, [facility] (Pharmacist) on 04/26/21 at [place] List Status: <None>   Medication Order Taking? Sig Documenting Provider Last Dose Status Informant  albuterol (VENTOLIN HFA) 108 (90 Base) MCG/ACT inhaler [id number] No Inhale 2 puffs into the lungs every 6 (six) hours as needed. Loman Brooklyn, FNP Taking Active   ARIPiprazole  (ABILIFY) 10 MG tablet [id number] No Take 10 mg by mouth daily. [provider] Taking Active   atorvastatin (LIPITOR) 40 MG tablet [id number]  Take 1 tablet (40 mg total) by mouth daily. Loman Brooklyn, FNP  Active   azelastine (ASTELIN) 0.1 % nasal spray [id number] No Place 1 spray into both nostrils 2 (two) times daily. Use in each nostril as directed Chesley Mires, MD Taking Active   BELSOMRA 20 MG TABS [id number] No Take 1 tablet by mouth at bedtime as needed. [provider] Taking Active   budesonide-formoterol (SYMBICORT) 160-4.5 MCG/ACT inhaler [id number] No Inhale 2 puffs into the lungs in the morning and at bedtime. Chesley Mires, MD Taking Active  cefUROXime (CEFTIN) 500 MG tablet [id number]  Take 1 tablet (500 mg total) by mouth 2 (two) times daily with a meal for 5 days. 500 mg twice a day for 5 days Chesley Mires, MD  Active   cephALEXin (KEFLEX) 500 MG capsule [id number]  Take 1 capsule (500 mg total) by mouth 4 (four) times daily. Dettinger, Fransisca Kaufmann, MD  Active   Continuous Blood Gluc Sensor (FREESTYLE LIBRE 2 SENSOR) [place] [id number] No by Does not apply route. [provider] Taking Active            Med Note Blanca Friend, Sherian Maroon Mar 08, 2021 10:31 AM) SENT TO [employer] SUP[employer] FOR APPROVAL  cyclobenzaprine (FLEXERIL) 10 MG tablet [id number] No  [provider] Taking Active   diazepam (VALIUM) 5 MG tablet [id number] No Take 5 mg by mouth in the morning and at bedtime.  [provider] Taking Active   Dulaglutide (TRULICITY) 1.5 FG/1.8EX SOPN [id number] Yes Inject 1.5 mg into the skin once a week. STOP Januvia. Start after 0.28m sample packs JLoman Brooklyn FNP  Active   fluconazole (DIFLUCAN) 100 MG tablet [id number] Take 1 tablet (100 mg total) by mouth daily for 3 days. SChesley Mires MD  Active   fluticasone ([facility] 50 MCG/ACT nasal spray [id number] Place 2 sprays into both nostrils daily. JLoman Brooklyn FNP Taking Active    glipiZIDE (GLUCOTROL XL) 5 MG 24 hr tablet [id number] TAKE (1) TABLET BY MOUTH ONCE DAILY WITH EVENING MEAL. JLoman Brooklyn FNP  Active   glucose blood (CONTOUR NEXT TEST) test strip [id number] Test BS 4 times daily Dx E11.9 JLoman Brooklyn FNP Taking Active   guaiFENesin (MUCINEX) 600 MG 12 hr tablet [id number] Take 2 tablets (1,200 mg total) by mouth 2 (two) times daily as needed for to loosen phlegm or cough. SChesley Mires MD Taking Active   lamoTRIgine (LAMICTAL) 200 MG tablet [id number] Take 200 mg by mouth at bedtime. For mood disorder. [provider] Taking Active   lansoprazole (PREVACID) 30 MG capsule [id number] Take 1 capsule (30 mg total) by mouth daily at 12 noon. JLoman Brooklyn FNP Taking Active   levocetirizine (XYZAL) 5 MG tablet [id number] Take 1 tablet (5 mg total) by mouth every evening. JLoman Brooklyn FNP  Active   lisinopril (ZESTRIL) 5 MG tablet [id number] Take 1 tablet (5 mg total) by mouth daily. JHendricks LimesF, FNP  Active   meclizine (ANTIVERT) 25 MG tablet [id number] Take 2 tablets (50 mg total) by mouth daily as needed for dizziness. JLoman Brooklyn FNP Taking Active   montelukast (SINGULAIR) 10 MG tablet [id number] TAKE (1) TABLET BY MOUTH AT BEDTIME. JLoman Brooklyn FNP  Active   nystatin (MYCOSTATIN) 100000 UNIT/ML suspension [id number] Take 5 mLs (500,000 Units total) by mouth 4 (four) times daily.  Patient not taking: Reported on 03/29/2021   HSharion Balloon FNP Not Taking Active   ondansetron (ZOFRAN ODT) 4 MG disintegrating tablet [id number] Take 1 tablet (4 mg total) by mouth every 8 (eight) hours as needed for nausea or vomiting. JLoman Brooklyn FNP Taking Active   OSummit Ambulatory Surgical Center LLCLancets 348JMISC [id number] Use to test blood sugar twice daily. DX: E11.9 JLoman Brooklyn FNP Taking Active   oxybutynin (DITROPAN-XL) 10 MG 24 hr tablet [id number] TAKE (1) TABLET BY MOUTH AT BEDTIME. JLoman Brooklyn FNP  Active  oxyCODONE (ROXICODONE) 15 MG immediate release tablet [id number] No Take 7.5 mg by mouth every 6 (six) hours as needed.  [provider] Taking Active Self           Med Note Rulon Abide Apr 22, 2018  3:54 PM)    predniSONE (DELTASONE) 10 MG tablet [id number]  Take 3 tablets (30 mg total) by mouth daily with breakfast for 2 days, THEN 2 tablets (20 mg total) daily with breakfast for 2 days, THEN 1 tablet (10 mg total) daily with breakfast for 2 days. Chesley Mires, MD  Active   pregabalin (LYRICA) 150 MG capsule [id number]  Take 1 capsule (150 mg total) by mouth 2 (two) times daily. Loman Brooklyn, FNP  Active   PREMARIN 0.3 MG tablet [id number]  TAKE (1) TABLET BY MOUTH ONCE DAILY. Loman Brooklyn, FNP  Active     Discontinued 04/26/21 2222 (Change in therapy)   temazepam (RESTORIL) 15 MG capsule [id number] No Take 15 mg by mouth at bedtime. [provider] Taking Active            Med Note Redmond Baseman Mar 20, 2020  2:30 PM) 03/20/20 - prescribed but not started yet  traZODone (DESYREL) 100 MG tablet [id number] No Take 100 mg by mouth at bedtime. [provider] Taking Active   valACYclovir (VALTREX) 500 MG tablet [id number] No Take 1 tablet (500 mg total) by mouth daily as needed (shingles flare up). Gwenlyn Perking, FNP Taking Active   Med List Note Loman Brooklyn, [place] 12/04/19 1526): Not Rx'd by PCP: sertraline, valium, lamotrigine, belsomra, and oxycodone - [facility] 12/02/19.            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   Type 2 diabetes mellitus with hypoglycemia without coma, without long-term current use of insulin ([place]) 07/06/2018   Bilateral temporomandibular joint pain 01/21/2018   OSA (obstructive sleep apnea) 01/21/2018   Vertigo 01/21/2018   DDD (degenerative disc disease), cervical 07/17/2017   Degeneration of lumbar intervertebral disc 07/17/2017   OCD (obsessive compulsive disorder) 11/15/2015   Tobacco use disorder 11/15/2015   Current use of estrogen therapy 01/12/2014   PTSD (post-traumatic stress disorder) 08/03/2011   Bipolar I disorder, most recent episode mixed, severe with psychotic features ([place])     Immunization History  Administered Date(s) Administered   Fluad Quad(high Dose 65+) 03/08/2021   Influenza Split 03/26/2016, 02/17/2019   Influenza,inj,Quad PF,6+ Mos 02/18/2017, 02/17/2019   Influenza,inj,quad, With Preservative 04/03/2015, 04/10/2018, 02/17/2019   Influenza-Unspecified 03/10/2020   Moderna SARS-COV2 Booster Vaccination 06/07/2020   Moderna Sars-Covid-2 Vaccination 08/19/2019, 09/23/2019   PNEUMOCOCCAL CONJUGATE-20 03/29/2021   Pneumococcal Conjugate-13 10/01/2017   Pneumococcal Polysaccharide-23 04/29/2007   Tdap 11/20/2017   Zoster Recombinat (Shingrix) 03/29/2021    Conditions to be addressed/monitored: HTN, HLD, and DMII  Care Plan : PHARMD MEDICATION MANAGEMENT  Updates made by Lavera Guise, [place] since 04/26/2021 12:00 AM     Problem: DISEASE PROGRESSION PREVENTION      Long-Range Goal: T2DM  Recent Progress: Not on track  Priority: High  Note:   Current Barriers:  Unable to independently afford treatment regimen Unable to achieve control of T2DM   Pharmacist Clinical Goal(s):  Over the next 90 days, patient will verbalize ability to afford treatment regimen achieve control of T2DM as evidenced by IMPROVED GLYCEMIC CONTROL through collaboration with PharmD and provider.   Interventions: 1:1 collaboration with  Loman Brooklyn, FNP regarding development and update of comprehensive plan of care as evidenced by provider attestation and co-signature Inter-disciplinary care team collaboration (see longitudinal plan of care) Comprehensive medication review performed; medication list updated in electronic medical record  Diabetes: Uncontrolled-A1C 7.4%; current treatment: GLIPIZIDE, JANUVIA-->TRULICITY 5.73UK SQ WEEKLY TODAY INTOLERANCES METFORMIN (DUE TO GI) DISCONTINUED FARXIGA DUE TO RECURRENT UTI/YEAST INFECTIONS DESPITE CONTROLLED SUGARS STOP JANUVIA  CONTINUE GLIPIZIDE (ONLY SHORT TERM, WILL D/C) START TRULICITY 0.25KY SQ WEEKLY Denies personal and family history of Medullary thyroid cancer (MTC) SAMPLE GIVEN BGS UP TO 200s--need more potent medication vs januvia Current glucose readings: fasting glucose: <180, post prandial glucose: 200s Patient received her Libre 2 in the mail--[employer] VIA [place] PORTAL INSTRUCTED HOW TO ORDER REFILLS Denies hypoglycemic/hyperglycemic symptoms Discussed meal planning options and Plate method for healthy eating--Patient admits to not eating wisely, she is trying to work on this Avoid sugary drinks and desserts Incorporate balanced protein, non starchy veggies, 1 serving of carbohydrate with each meal Increase water intake Increase physical activity as able Current exercise: active job  Educated on current medications, patient on pill packaging with [facility]--discussed D/C OF JANUVIA Recommended may switch to GLP1 (rybelsus), LIBRE Continue glipizide (with dinner), STOP JANUVIA, START TRULICITY 7.06CB SQ WEEKLY Discussed Blood pressure and cholesterol--stable, continue current medicatoins as prescribed, patient using pill packs monthly  Patient Goals/Self-Care Activities Over the next 90 days, patient will:  - take medications as prescribed check glucose daily (fasting) or if symptomatic, document, and provide at future  appointments  Follow Up Plan: Telephone follow up appointment with care management team member scheduled for: 1 month      Medication Assistance: None required.  Patient affirms current coverage meets needs.  Patient's preferred pharmacy is:  [facility], [place] [address] [place] [address] Phone: [phone] Fax: [phone]  Uses pill box? Yes-DISPILL PILL PACKAGING SYSTEM VIA [facility] Pt endorses 100% compliance  Follow Up:  Patient agrees to Care Plan and Follow-up.  Plan: Telephone follow up appointment with care management team member scheduled for:  NEXT WEEK   Regina Eck, PharmD, BCPS Clinical Pharmacist, [place]  II Phone [phone]

## 2021-04-18 NOTE — Patient Instructions (Signed)
Visit Information  Thank you for taking time to visit with me today. Please don't hesitate to contact me if I can be of assistance to you before our next scheduled telephone appointment.  If you need to cancel or re-schedule our visit, please call [phone] and our care guide team will be happy to assist you.  Following is a list of the goals we discussed today:  Current Barriers:  Unable to independently afford treatment regimen Unable to achieve control of T2DM   Pharmacist Clinical Goal(s):  Over the next 90 days, patient will verbalize ability to afford treatment regimen achieve control of T2DM as evidenced by IMPROVED GLYCEMIC CONTROL through collaboration with PharmD and provider.   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) DISCONTINUED FARXIGA DUE TO RECURRENT UTI/YEAST INFECTIONS DESPITE CONTROLLED SUGARS STOP JANUVIA  CONTINUE GLIPIZIDE (ONLY SHORT TERM, WILL plan to D/C) START TRULICITY 0.75MG  SQ WEEKLY Denies personal and family history of Medullary thyroid cancer (MTC) SAMPLE GIVEN; Rx sent to [facility] for continuation Titrate to 1.5mg  sq weekly as tolerated Blood sugars in 200-300s per patient report--likely needs GLP1 to control blood sugars vs DPP4; libre will also help [place] understand patterns Current glucose readings: fasting glucose: 200s, post prandial glucose: 200-300s Patient received her Libre 2 in the mail--[employer] SUPPLY VIA [employer] PORTAL Extensive education provided on FS libre 2 CGM system; pt able to  demonstrated and teachback INSTRUCTED HOW TO ORDER REFILLS via adv dm supply Telephone: [phone] Denies hypoglycemic/hyperglycemic symptoms Discussed meal planning options and Plate method for healthy eating--Patient admits to not eating wisely, she is trying to work on this Avoid sugary drinks and desserts Incorporate balanced protein, non starchy veggies, 1 serving of carbohydrate with each meal Increase water intake Increase physical activity as able Current exercise: active job  Educated on current medications, patient on pill packaging with [facility]--discussed D/C OF JANUVIA Recommended may switch to GLP1 (rybelsus), LIBRE  Discussed Blood pressure and cholesterol--stable, continue current medications as prescribed, patient using pill packs monthly May titrate/switch statin if LDL not at goal at f/u-->would prefer rosuvastatin vs atorva (pt less likely to have myopathy which she sometimes reports)  Patient Goals/Self-Care Activities Over the next 90 days, patient will:  - take medications as prescribed check glucose daily (fasting) or if symptomatic, document, and provide at future appointments  Follow Up Plan: Telephone follow up appointment with care management team member scheduled for: 1 month   The patient verbalized understanding of instructions, educational materials, and care plan provided today and declined offer to receive copy of patient instructions, educational materials, and care plan.   Signature Regina Eck, PharmD, BCPS Clinical Pharmacist, [place]  II Phone [phone]

## 2021-04-18 NOTE — Progress Notes (Signed)
Virtual Visit via telephone Note  I connected with Cynthia Deleon on 04/18/21 at 1447 by telephone and verified that I am speaking with the correct person using two identifiers. CLOVIA REINE is currently located at home and patient are currently with her during visit. The provider, Fransisca Kaufmann Taraneh Metheney, MD is located in their office at time of visit.  Call ended at 1453  I discussed the limitations, risks, security and privacy concerns of performing an evaluation and management service by telephone and the availability of in person appointments. I also discussed with the patient that there may be a patient responsible charge related to this service. The patient expressed understanding and agreed to proceed.   History and Present Illness: Patient has dysuria and frequency and urgency and aches.  She has abdominal pain and flank pain. She denies fevers.  She says it started yesterday and is worsening.  She feels like it is worsening.  She denies vaginal discharge or symptoms.    Outpatient Encounter Medications as of 04/18/2021  Medication Sig   cephALEXin (KEFLEX) 500 MG capsule Take 1 capsule (500 mg total) by mouth 4 (four) times daily.   fluconazole (DIFLUCAN) 150 MG tablet Take 1 tablet (150 mg total) by mouth once for 1 dose.   albuterol (VENTOLIN HFA) 108 (90 Base) MCG/ACT inhaler Inhale 2 puffs into the lungs every 6 (six) hours as needed.   ARIPiprazole (ABILIFY) 10 MG tablet Take 10 mg by mouth daily.   atorvastatin (LIPITOR) 40 MG tablet Take 1 tablet (40 mg total) by mouth daily.   azelastine (ASTELIN) 0.1 % nasal spray Place 1 spray into both nostrils 2 (two) times daily. Use in each nostril as directed   BELSOMRA 20 MG TABS Take 1 tablet by mouth at bedtime as needed.   budesonide-formoterol (SYMBICORT) 160-4.5 MCG/ACT inhaler Inhale 2 puffs into the lungs in the morning and at bedtime.   Continuous Blood Gluc Sensor (FREESTYLE LIBRE 2 SENSOR) MISC by Does not apply route.    cyclobenzaprine (FLEXERIL) 10 MG tablet    diazepam (VALIUM) 5 MG tablet Take 5 mg by mouth in the morning and at bedtime.    fluticasone (FLONASE) 50 MCG/ACT nasal spray Place 2 sprays into both nostrils daily.   glipiZIDE (GLUCOTROL XL) 5 MG 24 hr tablet TAKE (1) TABLET BY MOUTH ONCE DAILY WITH EVENING MEAL.   glucose blood (CONTOUR NEXT TEST) test strip Test BS 4 times daily Dx E11.9   guaiFENesin (MUCINEX) 600 MG 12 hr tablet Take 2 tablets (1,200 mg total) by mouth 2 (two) times daily as needed for to loosen phlegm or cough.   lamoTRIgine (LAMICTAL) 200 MG tablet Take 200 mg by mouth at bedtime. For mood disorder.   lansoprazole (PREVACID) 30 MG capsule Take 1 capsule (30 mg total) by mouth daily at 12 noon.   levocetirizine (XYZAL) 5 MG tablet Take 1 tablet (5 mg total) by mouth every evening.   lisinopril (ZESTRIL) 5 MG tablet Take 1 tablet (5 mg total) by mouth daily.   meclizine (ANTIVERT) 25 MG tablet Take 2 tablets (50 mg total) by mouth daily as needed for dizziness.   montelukast (SINGULAIR) 10 MG tablet TAKE (1) TABLET BY MOUTH AT BEDTIME.   nystatin (MYCOSTATIN) 100000 UNIT/ML suspension Take 5 mLs (500,000 Units total) by mouth 4 (four) times daily. (Patient not taking: Reported on 03/29/2021)   ondansetron (ZOFRAN ODT) 4 MG disintegrating tablet Take 1 tablet (4 mg total) by mouth every 8 (eight)  hours as needed for nausea or vomiting.   OneTouch Delica Lancets 48J MISC Use to test blood sugar twice daily. DX: E11.9   oxybutynin (DITROPAN-XL) 10 MG 24 hr tablet TAKE (1) TABLET BY MOUTH AT BEDTIME.   oxyCODONE (ROXICODONE) 15 MG immediate release tablet Take 7.5 mg by mouth every 6 (six) hours as needed.    pregabalin (LYRICA) 150 MG capsule Take 1 capsule (150 mg total) by mouth 2 (two) times daily.   PREMARIN 0.3 MG tablet TAKE (1) TABLET BY MOUTH ONCE DAILY.   sitaGLIPtin (JANUVIA) 100 MG tablet Take 1 tablet (100 mg total) by mouth daily.   temazepam (RESTORIL) 15 MG  capsule Take 15 mg by mouth at bedtime.   traZODone (DESYREL) 100 MG tablet Take 100 mg by mouth at bedtime.   valACYclovir (VALTREX) 500 MG tablet Take 1 tablet (500 mg total) by mouth daily as needed (shingles flare up).   No facility-administered encounter medications on file as of 04/18/2021.    Review of Systems  Constitutional:  Negative for chills and fever.  Eyes:  Negative for visual disturbance.  Respiratory:  Negative for chest tightness and shortness of breath.   Cardiovascular:  Negative for chest pain and leg swelling.  Gastrointestinal:  Positive for abdominal pain.  Genitourinary:  Positive for dysuria, flank pain, frequency and urgency. Negative for difficulty urinating, hematuria, vaginal bleeding, vaginal discharge and vaginal pain.  Musculoskeletal:  Negative for back pain and gait problem.  Skin:  Negative for rash.  Neurological:  Negative for light-headedness and headaches.  Psychiatric/Behavioral:  Negative for agitation and behavioral problems.   All other systems reviewed and are negative.  Observations/Objective: Patient sounds comfortable and in no acute distress  Urinalysis: 11-30 WBCs, 0-10 epithelial cells, moderate bacteria, nitrite positive, 3+ leukocytes and trace glucose. Assessment and Plan: Problem List Items Addressed This Visit   None Visit Diagnoses     Acute cystitis without hematuria    -  Primary   Relevant Medications   fluconazole (DIFLUCAN) 150 MG tablet   cephALEXin (KEFLEX) 500 MG capsule   Other Relevant Orders   Urine Culture   Urinalysis, Complete (Completed)   Microscopic Examination (Completed)       Will treat leg with Keflex and send Diflucan for her.  Urine does show like she has an infection. Follow up plan: Return if symptoms worsen or fail to improve.     I discussed the assessment and treatment plan with the patient. The patient was provided an opportunity to ask questions and all were answered. The patient  agreed with the plan and demonstrated an understanding of the instructions.   The patient was advised to call back or seek an in-person evaluation if the symptoms worsen or if the condition fails to improve as anticipated.  The above assessment and management plan was discussed with the patient. The patient verbalized understanding of and has agreed to the management plan. Patient is aware to call the clinic if symptoms persist or worsen. Patient is aware when to return to the clinic for a follow-up visit. Patient educated on when it is appropriate to go to the emergency department.    I provided 6 minutes of non-face-to-face time during this encounter.    Worthy Rancher, MD

## 2021-04-21 DIAGNOSIS — E1165 Type 2 diabetes mellitus with hyperglycemia: Secondary | ICD-10-CM | POA: Diagnosis not present

## 2021-04-23 LAB — URINE CULTURE

## 2021-04-25 ENCOUNTER — Other Ambulatory Visit: Payer: Self-pay

## 2021-04-25 ENCOUNTER — Telehealth: Payer: Self-pay | Admitting: Pulmonary Disease

## 2021-04-25 ENCOUNTER — Telehealth: Payer: Medicare Other

## 2021-04-25 MED ORDER — CEFUROXIME AXETIL 500 MG PO TABS: 500.0000 mg | ORAL_TABLET | Freq: Two times a day (BID) | ORAL | 0 refills | Status: AC

## 2021-04-25 MED ORDER — PREDNISONE 10 MG PO TABS: ORAL_TABLET | ORAL | 0 refills | Status: AC

## 2021-04-25 MED ORDER — FLUCONAZOLE 100 MG PO TABS
100.0000 mg | ORAL_TABLET | Freq: Every day | ORAL | 0 refills | Status: AC
Start: 2021-04-25 — End: 2021-04-28

## 2021-04-25 NOTE — Telephone Encounter (Signed)
Please send script for ceftin 500 mg bid for 5 days.  Please send script for prednisone 10 mg pill >> 3 pills daily for 2 days, 2 pills daily for 2 days, 1 pill daily for 2 days.  Please send script for diflucan 100 mg daily for 3 days.

## 2021-04-25 NOTE — Telephone Encounter (Signed)
Addressed in separate encounter sent to Dr. Halford Chessman.

## 2021-04-25 NOTE — Telephone Encounter (Addendum)
Called and spoke to patient. She voiced understanding. Medications sent in to [facility]. Nothing further needed at this time.

## 2021-04-25 NOTE — Telephone Encounter (Signed)
Primary Pulmonologist: Dr. Halford Chessman  Last office visit and with whom: 03/15/21 Dr. Halford Chessman  What do we see them for (pulmonary problems): asthmatic bronchitis   Last OV assessment/plan: see below  Was appointment offered to patient (explain)?  No    Reason for call: Called the office and states she is having shortness of breath and a congestion, cough. No fever. Wheezing. Has taken mucinex. Asked if she could have diflucan called in if she is prescribed an antibiotic to prevent a yeast infection.   Dr. Halford Chessman please advise   Allergies  Allergen Reactions   Chantix [Varenicline] Other (See Comments)    Altered mental status-Per patient was put on allergy list by Dr Lorriane Shire bu patient staes she is not allergic to this and is currently taking it.    Divalproex Sodium Other (See Comments)    Hallucinations  Other reaction(s): Confusion   Other Anaphylaxis   Pseudoeph-Hydrocodone-Gg Nausea Only   Sulfa Antibiotics Anaphylaxis and Nausea Only   Varenicline Tartrate Other (See Comments)    Other reaction(s): Confusion   Ambien [Zolpidem] Other (See Comments)    Causes sleep walking   Clavulanic Acid Diarrhea   Codeine Nausea And Vomiting   Elemental Sulfur Other (See Comments)    Swelling of tongue   Hydrocodone Nausea And Vomiting   Macrobid [Nitrofurantoin] Nausea And Vomiting    Dizziness, nausea, vomiting   Paxil [Paroxetine Hcl] Other (See Comments)    Causes ringing in the ears.    Amoxicillin Rash    Has patient had a PCN reaction causing immediate rash, facial/tongue/throat swelling, SOB or lightheadedness with hypotension: No Has patient had a PCN reaction causing severe rash involving mucus membranes or skin necrosis: No Has patient had a PCN reaction that required hospitalization: No Has patient had a PCN reaction occurring within the last 10 years: Yes If all of the above answers are "NO", then may proceed with Cephalosporin use.    Farxiga [Dapagliflozin]     RECURRENT  YEAST/UTI   Metformin And Related Diarrhea   Tape Rash    Immunization History  Administered Date(s) Administered   Fluad Quad(high Dose 65+) 03/08/2021   Influenza Split 03/26/2016, 02/17/2019   Influenza,inj,Quad PF,6+ Mos 02/18/2017, 02/17/2019   Influenza,inj,quad, With Preservative 04/03/2015, 04/10/2018, 02/17/2019   Influenza-Unspecified 03/10/2020   Moderna SARS-COV2 Booster Vaccination 06/07/2020   Moderna Sars-Covid-2 Vaccination 08/19/2019, 09/23/2019   PNEUMOCOCCAL CONJUGATE-20 03/29/2021   Pneumococcal Conjugate-13 10/01/2017   Pneumococcal Polysaccharide-23 04/29/2007   Tdap 11/20/2017   Zoster Recombinat (Shingrix) 03/29/2021     Assessment/Plan:    Allergic asthma and emphysema. - anoro was not effective - change symbicort to 80-4.5 two puffs bid - prn albuterol - will arrange for spacer device - continue singulair - prn mucinex   Perennial allergic rhinitis. - continue flonase, azelastine   History of tobacco abuse. - she has low dose CT chest for lung cancer screening scheduled for later this month   Snoring with history of obstructive sleep apnea. - she has history of depression and hypertension -  will schedule home sleep study to assess current status of sleep apnea and then determine if she needs additional intervention   Thrush. - will give her course of diflucan

## 2021-04-25 NOTE — Telephone Encounter (Signed)
ATC patient X2. LMTCB for more info on sick message

## 2021-04-27 ENCOUNTER — Ambulatory Visit: Payer: Medicare Other | Admitting: Pharmacist

## 2021-04-27 DIAGNOSIS — I10 Essential (primary) hypertension: Secondary | ICD-10-CM

## 2021-04-27 DIAGNOSIS — E11649 Type 2 diabetes mellitus with hypoglycemia without coma: Secondary | ICD-10-CM

## 2021-04-28 ENCOUNTER — Other Ambulatory Visit: Payer: Self-pay | Admitting: Family Medicine

## 2021-04-28 DIAGNOSIS — Z1231 Encounter for screening mammogram for malignant neoplasm of breast: Secondary | ICD-10-CM

## 2021-04-30 ENCOUNTER — Ambulatory Visit: Payer: Medicare Other | Attending: Pulmonary Disease | Admitting: Pulmonary Disease

## 2021-04-30 ENCOUNTER — Other Ambulatory Visit: Payer: Self-pay

## 2021-04-30 DIAGNOSIS — G4736 Sleep related hypoventilation in conditions classified elsewhere: Secondary | ICD-10-CM | POA: Insufficient documentation

## 2021-04-30 DIAGNOSIS — R0902 Hypoxemia: Secondary | ICD-10-CM | POA: Insufficient documentation

## 2021-04-30 DIAGNOSIS — G4733 Obstructive sleep apnea (adult) (pediatric): Secondary | ICD-10-CM | POA: Insufficient documentation

## 2021-04-30 DIAGNOSIS — R0683 Snoring: Secondary | ICD-10-CM

## 2021-05-02 ENCOUNTER — Other Ambulatory Visit: Payer: Self-pay | Admitting: Family Medicine

## 2021-05-02 DIAGNOSIS — M5416 Radiculopathy, lumbar region: Secondary | ICD-10-CM | POA: Insufficient documentation

## 2021-05-02 NOTE — Progress Notes (Signed)
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) DISCONTINUED FARXIGA DUE TO RECURRENT UTI/YEAST INFECTIONS DESPITE CONTROLLED SUGARS STOP JANUVIA  CONTINUE GLIPIZIDE (ONLY SHORT TERM, WILL plan to D/C) INCREASE TRULICITY TO 8.4XL SQ WEEKLY Denies personal and family history of Medullary thyroid cancer (MTC) Blood sugars have decreased <200s per patient report--likely needs GLP1 to control blood sugars vs DPP4; [name] will also help [place] understand patterns Current glucose readings: fasting glucose: 200s, post prandial glucose: 200-300s Patient received her Libre 2 in the mail--[employer]ES SUP[employer]Y VIA PARACHUTE PORTAL Extensive education provided on FS libre 2 CGM system; pt able to demonstrated and teachback INSTRUCTED HOW TO ORDER REFILLS via adv dm supply Telephone: [phone] Denies hypoglycemic/hyperglycemic symptoms Discussed meal planning options and Plate method for healthy eating--Patient admits to not eating wisely, she is trying to work on this Avoid sugary drinks and desserts Incorporate balanced protein, non starchy veggies, 1 serving of carbohydrate with each meal Increase water intake Increase physical activity as able Current exercise: active job  Educated on current medications, patient on pill packaging with [facility]--discussed D/C OF JANUVIA Recommended may switch to GLP1 (rybelsus), LIBRE  Discussed Blood pressure and cholesterol--stable, continue current medications as prescribed, patient using pill packs monthly May titrate/switch statin if LDL not at goal at f/u-->would prefer rosuvastatin vs atorva (pt less likely to have myopathy which she sometimes reports)  Patient  Goals/Self-Care Activities Over the next 90 days, patient will:  - take medications as prescribed check glucose daily (fasting) or if symptomatic, document, and provide at future appointments  Follow Up Plan: Telephone follow up appointment with care management team member scheduled for: 1 month  Subjective: Cynthia Deleon is an 65 y.o. year old female who is a primary patient of Loman Brooklyn, FNP.  The CCM team was consulted for assistance with disease management and care coordination needs.    Engaged with patient by telephone for follow up visit in response to provider referral for pharmacy case management and/or care coordination services.   Consent to Services:  The patient was given information about Chronic Care Management services, agreed to services, and gave verbal consent prior to initiation of services.  Please see initial visit note for detailed documentation.   Patient Care Team: Loman Brooklyn, FNP as PCP - General (Family Medicine) Suella Broad, MD as Consulting Physician (Physical Medicine and Rehabilitation) Norma Fredrickson, MD as Consulting Physician (Psychiatry) Ilean China, RN as Registered Nurse Phillips Odor, MD as Consulting Physician (Neurology) Lavera Guise, [facility] (Pharmacist) Warren Danes, PA-C as Physician Assistant (Dermatology) Madelin Headings, DO (Optometry)  Objective:  Lab Results  Component Value Date   CREATININE 0.79 03/29/2021   CREATININE 0.84 10/10/2020   CREATININE 0.72 06/08/2020    Lab Results  Component Value Date   HGBA1C 7.4 (H) 03/08/2021   Last diabetic Eye exam:  Lab Results  Component Value Date/Time   HMDIABEYEEXA No Retinopathy 07/25/2020 12:00 AM    Last diabetic Foot exam: No results found for: HMDIABFOOTEX      Component Value Date/Time   CHOL 169 03/29/2021 1237   TRIG 118 03/29/2021 1237   HDL 52 03/29/2021 1237   CHOLHDL 3.3 03/29/2021 1237   CHOLHDL 4.1 11/16/2015 0715   VLDL 48 (H)  11/16/2015 0715  Novelty 96 03/29/2021 1237    Hepatic Function Latest Ref Rng & Units 03/29/2021 10/10/2020 03/07/2020  Total Protein 6.0 - 8.5 g/dL 6.4 6.7 6.3  Albumin 3.8 - 4.8 g/dL 4.6 5.1(H) 4.6  AST 0 - 40 IU/L '20 18 11  ' ALT 0 - 32 IU/L '13 13 7  ' Alk Phosphatase 44 - 121 IU/L 66 55 68  Total Bilirubin 0.0 - 1.2 mg/dL 0.6 0.7 0.4  Bilirubin, Direct 0.1 - 0.5 mg/dL - - -    Lab Results  Component Value Date/Time   TSH 1.220 07/04/2020 09:58 AM   TSH 1.060 03/07/2020 08:52 AM    CBC Latest Ref Rng & Units 03/29/2021 10/10/2020 06/08/2020  WBC 3.4 - 10.8 x10E3/uL 8.2 6.5 11.0(H)  Hemoglobin 11.1 - 15.9 g/dL 16.2(H) 15.5 15.8  Hematocrit 34.0 - 46.6 % 48.1(H) 44.5 45.7  Platelets 150 - 450 x10E3/uL 204 267 201    No results found for: VD25OH  Clinical ASCVD: No  The 10-year ASCVD risk score (Arnett DK, et al., 2019) is: 12.3%   Values used to calculate the score:     Age: 65 years     Sex: Female     Is Non-Hispanic African American: No     Diabetic: Yes     Tobacco smoker: No     Systolic Blood Pressure: 798 mmHg     Is BP treated: Yes     HDL Cholesterol: 52 mg/dL     Total Cholesterol: 169 mg/dL    Other: (CHADS2VASc if Afib, PHQ9 if depression, MMRC or CAT for COPD, ACT, DEXA)  Social History   Tobacco Use  Smoking Status Former   Packs/day: 0.50   Years: 35.00   Pack years: 17.50   Types: Cigarettes   Quit date: 05/11/2019   Years since quitting: 1.9  Smokeless Tobacco Never   BP Readings from Last 3 Encounters:  03/29/21 124/73  03/15/21 132/78  02/20/21 110/60   Pulse Readings from Last 3 Encounters:  03/29/21 89  03/15/21 74  02/20/21 84   Wt Readings from Last 3 Encounters:  03/29/21 178 lb 9.6 oz (81 kg)  03/15/21 179 lb (81.2 kg)  02/20/21 178 lb (80.7 kg)    Assessment: Review of patient past medical history, allergies, medications, health status, including review of consultants reports, laboratory and other test data, was performed as  part of comprehensive evaluation and provision of chronic care management services.   SDOH:  (Social Determinants of Health) assessments and interventions performed:    CCM Care Plan  Allergies  Allergen Reactions   Chantix [Varenicline] Other (See Comments)    Altered mental status-Per patient was put on allergy list by Dr Lorriane Shire bu patient staes she is not allergic to this and is currently taking it.    Divalproex Sodium Other (See Comments)    Hallucinations  Other reaction(s): Confusion   Other Anaphylaxis   Pseudoeph-Hydrocodone-Gg Nausea Only   Sulfa Antibiotics Anaphylaxis and Nausea Only   Varenicline Tartrate Other (See Comments)    Other reaction(s): Confusion   Ambien [Zolpidem] Other (See Comments)    Causes sleep walking   Clavulanic Acid Diarrhea   Codeine Nausea And Vomiting   Elemental Sulfur Other (See Comments)    Swelling of tongue   Hydrocodone Nausea And Vomiting   Macrobid [Nitrofurantoin] Nausea And Vomiting    Dizziness, nausea, vomiting   Paxil [Paroxetine Hcl] Other (See Comments)    Causes ringing in the ears.    Amoxicillin Rash  Has patient had a PCN reaction causing immediate rash, facial/tongue/throat swelling, SOB or lightheadedness with hypotension: No Has patient had a PCN reaction causing severe rash involving mucus membranes or skin necrosis: No Has patient had a PCN reaction that required hospitalization: No Has patient had a PCN reaction occurring within the last 10 years: Yes If all of the above answers are "NO", then may proceed with Cephalosporin use.    Wilder Glade [Dapagliflozin]     RECURRENT YEAST/UTI   Metformin And Related Diarrhea   Tape Rash    Medications Reviewed Today     Reviewed by Lavera Guise, [facility] (Pharmacist) on 04/26/21 at [place] List Status: <None>   Medication Order Taking? Sig Documenting Provider Last Dose Status Informant  albuterol (VENTOLIN HFA) 108 (90 Base) MCG/ACT inhaler [id number] No Inhale 2  puffs into the lungs every 6 (six) hours as needed. Loman Brooklyn, FNP Taking Active   ARIPiprazole (ABILIFY) 10 MG tablet [id number] No Take 10 mg by mouth daily. [provider] Taking Active   atorvastatin (LIPITOR) 40 MG tablet [id number]  Take 1 tablet (40 mg total) by mouth daily. Loman Brooklyn, FNP  Active   azelastine (ASTELIN) 0.1 % nasal spray [id number] No Place 1 spray into both nostrils 2 (two) times daily. Use in each nostril as directed Chesley Mires, MD Taking Active   BELSOMRA 20 MG TABS [id number] No Take 1 tablet by mouth at bedtime as needed. [provider] Taking Active   budesonide-formoterol (SYMBICORT) 160-4.5 MCG/ACT inhaler [id number] No Inhale 2 puffs into the lungs in the morning and at bedtime. Chesley Mires, MD Taking Active   cefUROXime (CEFTIN) 500 MG tablet [id number]  Take 1 tablet (500 mg total) by mouth 2 (two) times daily with a meal for 5 days. 500 mg twice a day for 5 days Chesley Mires, MD  Active   cephALEXin (KEFLEX) 500 MG capsule [id number]  Take 1 capsule (500 mg total) by mouth 4 (four) times daily. Dettinger, Fransisca Kaufmann, MD  Active   Continuous Blood Gluc Sensor (FREESTYLE LIBRE 2 SENSOR) [place] [id number] No by Does not apply route. [provider] Taking Active            Med Note Blanca Friend, Sherian Maroon Mar 08, 2021 10:31 AM) SENT TO ADV DIABETES SUPPLY FOR APPROVAL  cyclobenzaprine (FLEXERIL) 10 MG tablet [id number] No  [provider] Taking Active   diazepam (VALIUM) 5 MG tablet [id number] No Take 5 mg by mouth in the morning and at bedtime.  [provider] Taking Active   Dulaglutide (TRULICITY) 1.5 IO/2.7OJ SOPN [id number] Yes Inject 1.5 mg into the skin once a week. STOP Januvia. Start after 0.44m sample packs JLoman Brooklyn FNP  Active   fluconazole (DIFLUCAN) 100 MG tablet [id number] Take 1 tablet (100 mg total) by mouth daily for 3 days. SChesley Mires MD  Active   fluticasone ([facility] 50 MCG/ACT nasal  spray [id number] Place 2 sprays into both nostrils daily. JLoman Brooklyn FNP Taking Active   glipiZIDE (GLUCOTROL XL) 5 MG 24 hr tablet [id number] TAKE (1) TABLET BY MOUTH ONCE DAILY WITH EVENING MEAL. JLoman Brooklyn FNP  Active   glucose blood (CONTOUR NEXT TEST) test strip [id number] Test BS 4 times daily Dx E11.9 JLoman Brooklyn FNP Taking Active   guaiFENesin (MUCINEX) 600 MG 12 hr tablet [id number] Take 2 tablets (1,200 mg total) by mouth 2 (two) times daily as  needed for to loosen phlegm or cough. Chesley Mires, MD Taking Active   lamoTRIgine (LAMICTAL) 200 MG tablet [id number] No Take 200 mg by mouth at bedtime. For mood disorder. [provider] Taking Active   lansoprazole (PREVACID) 30 MG capsule [id number] No Take 1 capsule (30 mg total) by mouth daily at 12 noon. Loman Brooklyn, FNP Taking Active   levocetirizine (XYZAL) 5 MG tablet [id number]  Take 1 tablet (5 mg total) by mouth every evening. Loman Brooklyn, FNP  Active   lisinopril (ZESTRIL) 5 MG tablet [id number]  Take 1 tablet (5 mg total) by mouth daily. [name] F, FNP  Active   meclizine (ANTIVERT) 25 MG tablet [id number] No Take 2 tablets (50 mg total) by mouth daily as needed for dizziness. Loman Brooklyn, FNP Taking Active   montelukast (SINGULAIR) 10 MG tablet [id number]  TAKE (1) TABLET BY MOUTH AT BEDTIME. Loman Brooklyn, FNP  Active   nystatin (MYCOSTATIN) 100000 UNIT/ML suspension [id number] No Take 5 mLs (500,000 Units total) by mouth 4 (four) times daily.  Patient not taking: Reported on 03/29/2021   Sharion Balloon, FNP Not Taking Active   ondansetron (ZOFRAN ODT) 4 MG disintegrating tablet [id number] No Take 1 tablet (4 mg total) by mouth every 8 (eight) hours as needed for nausea or vomiting. Loman Brooklyn, FNP Taking Active   [facility] Lancets 54S MISC [id number] No Use to test blood sugar twice daily. DX: E11.9 Loman Brooklyn, FNP Taking Active   oxybutynin (DITROPAN-XL) 10 MG  24 hr tablet [id number]  TAKE (1) TABLET BY MOUTH AT BEDTIME. Loman Brooklyn, FNP  Active   oxyCODONE (ROXICODONE) 15 MG immediate release tablet [id number] No Take 7.5 mg by mouth every 6 (six) hours as needed.  [provider] Taking Active Self           Med Note Rulon Abide Apr 22, 2018  3:54 PM)    predniSONE (DELTASONE) 10 MG tablet [id number]  Take 3 tablets (30 mg total) by mouth daily with breakfast for 2 days, THEN 2 tablets (20 mg total) daily with breakfast for 2 days, THEN 1 tablet (10 mg total) daily with breakfast for 2 days. Chesley Mires, MD  Active   pregabalin (LYRICA) 150 MG capsule [id number]  Take 1 capsule (150 mg total) by mouth 2 (two) times daily. Loman Brooklyn, FNP  Active   PREMARIN 0.3 MG tablet [id number]  TAKE (1) TABLET BY MOUTH ONCE DAILY. Loman Brooklyn, FNP  Active     Discontinued 04/26/21 2222 (Change in therapy)   temazepam (RESTORIL) 15 MG capsule [id number] No Take 15 mg by mouth at bedtime. [provider] Taking Active            Med Note Redmond Baseman Mar 20, 2020  2:30 PM) 03/20/20 - prescribed but not started yet  traZODone (DESYREL) 100 MG tablet [id number] No Take 100 mg by mouth at bedtime. [provider] Taking Active   valACYclovir (VALTREX) 500 MG tablet [id number] No Take 1 tablet (500 mg total) by mouth daily as needed (shingles flare up). Gwenlyn Perking, FNP Taking Active   Med List Note Loman Brooklyn, [place] 12/04/19 1526): Not Rx'd by PCP: sertraline, valium, lamotrigine, belsomra, and oxycodone - [facility] 12/02/19.            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   Type 2 diabetes mellitus with hypoglycemia without coma, without long-term current use of insulin ([place]) 07/06/2018   Bilateral temporomandibular joint pain 01/21/2018   OSA (obstructive sleep apnea) 01/21/2018   Vertigo 01/21/2018   DDD (degenerative disc disease), cervical 07/17/2017   Degeneration of lumbar intervertebral disc 07/17/2017   OCD (obsessive compulsive disorder) 11/15/2015   Tobacco use disorder 11/15/2015   Current use of estrogen therapy 01/12/2014   PTSD (post-traumatic stress disorder) 08/03/2011   Bipolar I disorder, most recent episode mixed, severe with psychotic features ([place])     Immunization History  Administered Date(s) Administered   Fluad Quad(high Dose 65+) 03/08/2021   Influenza Split 03/26/2016, 02/17/2019   Influenza,inj,Quad PF,6+ Mos 02/18/2017, 02/17/2019   Influenza,inj,quad, With Preservative 04/03/2015, 04/10/2018, 02/17/2019   Influenza-Unspecified 03/10/2020   Moderna SARS-COV2 Booster Vaccination 06/07/2020   Moderna Sars-Covid-2 Vaccination 08/19/2019, 09/23/2019   PNEUMOCOCCAL CONJUGATE-20 03/29/2021   Pneumococcal Conjugate-13 10/01/2017   Pneumococcal Polysaccharide-23 04/29/2007   Tdap 11/20/2017   Zoster Recombinat (Shingrix) 03/29/2021    Conditions to be addressed/monitored: HTN and DMII  Care Plan : PHARMD MEDICATION MANAGEMENT  Updates made by Lavera Guise, [place] since 05/02/2021 12:00 AM     Problem: DISEASE PROGRESSION PREVENTION      Long-Range Goal: T2DM, HLD   Recent Progress: Not on track  Priority: High  Note:   Current Barriers:  Unable to independently afford treatment regimen Unable to achieve control of T2DM   Pharmacist Clinical Goal(s):  Over the next 90 days, patient will verbalize ability to afford treatment regimen achieve control of T2DM as evidenced by IMPROVED GLYCEMIC CONTROL through  collaboration with PharmD and provider.   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) DISCONTINUED FARXIGA DUE TO RECURRENT UTI/YEAST INFECTIONS DESPITE CONTROLLED SUGARS STOP JANUVIA  CONTINUE GLIPIZIDE (ONLY SHORT TERM, WILL plan to D/C) INCREASE TRULICITY TO 4.0HK SQ WEEKLY Denies personal and family history of Medullary thyroid cancer (MTC) Blood sugars have decreased <200s per patient report--likely needs GLP1 to control blood sugars vs DPP4; libre will also help [place] understand patterns Current glucose readings: fasting glucose: 200s, post prandial glucose: 200-300s Patient received her Libre 2 in the mail--[employer] DI[employer]ETES SUPPLY VIA [employer]UTE PORTAL Extensive education provided on FS libre 2 CGM system; pt able to demonstrated and teachback INSTRUCTED HOW TO ORDER REFILLS via adv dm supply Telephone: [phone] Denies hypoglycemic/hyperglycemic symptoms Discussed meal planning options and Plate method for healthy eating--Patient admits to not eating wisely, she is trying to work on this Avoid sugary drinks and desserts Incorporate balanced protein, non starchy veggies, 1 serving of carbohydrate with each meal Increase water intake Increase physical activity as able Current exercise: active job  Educated on current medications, patient on pill packaging with [facility]--discussed D/C OF JANUVIA Recommended may switch to GLP1 (rybelsus), LIBRE  Discussed Blood pressure and cholesterol--stable, continue current medications as prescribed,  patient using pill packs monthly May titrate/switch statin if LDL not at goal at f/u-->would prefer rosuvastatin vs atorva (pt less likely to have myopathy which she sometimes reports)  Patient Goals/Self-Care Activities Over the next 90 days, patient will:  - take medications as prescribed check glucose daily (fasting) or if symptomatic, document, and provide at future appointments  Follow Up Plan: Telephone follow up appointment with care management team member scheduled for: 1 month      Medication Assistance: None required.  Patient affirms current coverage meets needs.  Patient's preferred pharmacy is:  [facility] [place], [facility] [address] [place] [address] Phone: [phone] Fax: [phone]  Uses pill box? Yes-pill packs via [facility] Pt endorses 100% compliance  Follow Up:  Patient agrees to Care Plan and Follow-up.  Plan: Telephone follow up appointment with care management team member scheduled for:  1 month   Regina Eck, PharmD, BCPS Clinical Pharmacist, [place]  II Phone [phone]

## 2021-05-02 NOTE — Patient Instructions (Signed)
Visit Information  Thank you for taking time to visit with me today. Please don't hesitate to contact me if I can be of assistance to you before our next scheduled telephone appointment.  Following are the goals we discussed today:  Current Barriers:  Unable to independently afford treatment regimen Unable to achieve control of T2DM   Pharmacist Clinical Goal(s):  Over the next 90 days, patient will verbalize ability to afford treatment regimen achieve control of T2DM as evidenced by IMPROVED GLYCEMIC CONTROL through collaboration with PharmD and provider.   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) DISCONTINUED FARXIGA DUE TO RECURRENT UTI/YEAST INFECTIONS DESPITE CONTROLLED SUGARS STOP JANUVIA  CONTINUE GLIPIZIDE (ONLY SHORT TERM, WILL plan to D/C) INCREASE TRULICITY TO 1.5MG  SQ WEEKLY Denies personal and family history of Medullary thyroid cancer (MTC) Blood sugars have decreased <200s per patient report--likely needs GLP1 to control blood sugars vs DPP4; libre will also help [place] understand patterns Current glucose readings: fasting glucose: 200s, post prandial glucose: 200-300s Patient received her Libre 2 in the mail--ADV DIABETES SUPPLY VIA PARACHUTE PORTAL Extensive education provided on FS libre 2 CGM system; pt able to demonstrated and teachback INSTRUCTED HOW TO ORDER REFILLS via adv dm supply Telephone: [phone] Denies hypoglycemic/hyperglycemic symptoms Discussed meal planning options and Plate method for healthy eating--Patient  admits to not eating wisely, she is trying to work on this Avoid sugary drinks and desserts Incorporate balanced protein, non starchy veggies, 1 serving of carbohydrate with each meal Increase water intake Increase physical activity as able Current exercise: active job  Educated on current medications, patient on pill packaging with [facility]--discussed D/C OF JANUVIA Recommended may switch to GLP1 (rybelsus), LIBRE  Discussed Blood pressure and cholesterol--stable, continue current medications as prescribed, patient using pill packs monthly May titrate/switch statin if LDL not at goal at f/u-->would prefer rosuvastatin vs atorva (pt less likely to have myopathy which she sometimes reports)  Patient Goals/Self-Care Activities Over the next 90 days, patient will:  - take medications as prescribed check glucose daily (fasting) or if symptomatic, document, and provide at future appointments  Follow Up Plan: Telephone follow up appointment with care management team member scheduled for: 1 month   Please call the care guide team at [phone] if you need to cancel or reschedule your appointment.   The patient verbalized understanding of instructions, educational materials, and care plan provided today and declined offer to receive copy of patient instructions, educational materials, and care plan.   Signature .jdwp

## 2021-05-08 DIAGNOSIS — M5416 Radiculopathy, lumbar region: Secondary | ICD-10-CM | POA: Diagnosis not present

## 2021-05-09 ENCOUNTER — Telehealth: Payer: Self-pay | Admitting: Pulmonary Disease

## 2021-05-09 NOTE — Telephone Encounter (Signed)
PSG 04/30/21 >> AHI 6.5, SpO2 low 84%.  Spent 162.5 min with SpO2 < 88%.  Please inform her that her sleep study shows mild obstructive sleep apnea with low oxygen levels at night.  Please arrange for ROV with me or NP to discuss treatment options.

## 2021-05-09 NOTE — Procedures (Signed)
      Patient Name: Cynthia Deleon, Cynthia Deleon Date: 04/30/2021 Gender: Female D.O.B: 1956/03/31 Age (years): 24 Referring Provider: Chesley Mires MD, ABSM Height (inches): 38 Interpreting Physician: Chesley Mires MD, ABSM Weight (lbs): 179 RPSGT: Rosebud Poles BMI: 26 MRN: [id number] Neck Size: 15.50  CLINICAL INFORMATION Sleep Study Type: NPSG  Indication for sleep study: snoring, sleep disruption and daytime sleepiness.  Epworth Sleepiness Score: 8  SLEEP STUDY TECHNIQUE As per the AASM Manual for the Scoring of Sleep and Associated Events v2.3 (April 2016) with a hypopnea requiring 4% desaturations.  The channels recorded and monitored were frontal, central and occipital EEG, electrooculogram (EOG), submentalis EMG (chin), nasal and oral airflow, thoracic and abdominal wall motion, anterior tibialis EMG, snore microphone, electrocardiogram, and pulse oximetry.  MEDICATIONS Medications self-administered by patient taken the night of the study : N/A  SLEEP ARCHITECTURE The study was initiated at 10:13:25 PM and ended at 4:53:47 AM.  Sleep onset time was 83.7 minutes and the sleep efficiency was 64.7%. The total sleep time was 259.2 minutes.  Stage REM latency was 19.0 minutes.  The patient spent 16.98% of the night in stage N1 sleep, 64.11% in stage N2 sleep, 0.00% in stage N3 and 18.9% in REM.  Alpha intrusion was absent.  Supine sleep was 8.68%.  RESPIRATORY PARAMETERS The overall apnea/hypopnea index (AHI) was 6.5 per hour. There were 11 total apneas, including 0 obstructive, 11 central and 0 mixed apneas. There were 17 hypopneas and 0 RERAs.  The AHI during Stage REM sleep was 3.7 per hour.  AHI while supine was 29.3 per hour.  The mean oxygen saturation was 88.08%. The minimum SpO2 during sleep was 84.00%.  loud snoring was noted during this study.  CARDIAC DATA The 2 lead EKG demonstrated sinus rhythm. The mean heart rate was 75.31 beats per minute. Other  EKG findings include: None.  LEG MOVEMENT DATA The total PLMS were 0 with a resulting PLMS index of 0.00. Associated arousal with leg movement index was 0.0 .  IMPRESSIONS - Mild obstructive sleep apnea with an AHI of 6.5 and SpO2 low of 84%.   - She spent 162.5 minutes of sleep time with an SpO2 < 88%. - Supplemental oxygen was not applied during this study.  DIAGNOSIS - Obstructive Sleep Apnea (G47.33) - Nocturnal Hypoxemia (G47.36)  RECOMMENDATIONS - She should be tried on auto CPAP therapy first and have overnight oximetry arranged with this set up.  If she has persistent nocturnal hypoxemia, then she would need to return to the sleep lab for a CPAP titration study. - Avoid alcohol, sedatives and other CNS depressants that may worsen sleep apnea and disrupt normal sleep architecture. - Sleep hygiene should be reviewed to assess factors that may improve sleep quality. - Weight management and regular exercise should be initiated or continued if appropriate.  [Electronically signed] 05/09/2021 09:15 AM  Chesley Mires MD, ABSM Diplomate, American Board of Sleep Medicine   NPI: [id number] [place] PH: [phone]   FX: [phone] [place]

## 2021-05-11 NOTE — Telephone Encounter (Signed)
Called and spoke with patient and went over results. She has been scheduled for follow up with Dr. Halford Chessman in [place]. Nothing further needed at this time.  Next Appt With Pulmonology Chesley Mires, MD) 05/28/2021 at 9:00 AM

## 2021-05-11 NOTE — Telephone Encounter (Signed)
You don't have anything open in [place] until 07/04/2021  Please advise

## 2021-05-11 NOTE — Telephone Encounter (Signed)
Can double book with me when I am [place] the week of Christmas, or can schedule video visit with NP in [place].

## 2021-05-15 ENCOUNTER — Telehealth: Payer: Self-pay | Admitting: Pulmonary Disease

## 2021-05-15 MED ORDER — DOXYCYCLINE HYCLATE 100 MG PO TABS: 100.0000 mg | ORAL_TABLET | Freq: Two times a day (BID) | ORAL | 0 refills | Status: DC

## 2021-05-15 NOTE — Telephone Encounter (Signed)
Spoke with the pt  She is c/o increased SOB, wheezing and cough x 3 days  She states she feels like this is her typical COPD flare  She is coughing up very minimal, yellow sputum  She states that she took at home covid test after she initially called here this am and it was neg  She denies fevers or aches  She is asking if we can send her abx in  She was offered appt tomorrow but wanted to know if we could just call something in now  She confirmed she is still taking her symbicort, montelukast, astelin ns, mucinex   UTD on her flu and covid   Please advise thanks!  Allergies  Allergen Reactions   Chantix [Varenicline] Other (See Comments)    Altered mental status-Per patient was put on allergy list by Dr Lorriane Shire bu patient staes she is not allergic to this and is currently taking it.    Divalproex Sodium Other (See Comments)    Hallucinations  Other reaction(s): Confusion   Other Anaphylaxis   Pseudoeph-Hydrocodone-Gg Nausea Only   Sulfa Antibiotics Anaphylaxis and Nausea Only   Varenicline Tartrate Other (See Comments)    Other reaction(s): Confusion   Ambien [Zolpidem] Other (See Comments)    Causes sleep walking   Clavulanic Acid Diarrhea   Codeine Nausea And Vomiting   Elemental Sulfur Other (See Comments)    Swelling of tongue   Hydrocodone Nausea And Vomiting   Macrobid [Nitrofurantoin] Nausea And Vomiting    Dizziness, nausea, vomiting   Paxil [Paroxetine Hcl] Other (See Comments)    Causes ringing in the ears.    Amoxicillin Rash    Has patient had a PCN reaction causing immediate rash, facial/tongue/throat swelling, SOB or lightheadedness with hypotension: No Has patient had a PCN reaction causing severe rash involving mucus membranes or skin necrosis: No Has patient had a PCN reaction that required hospitalization: No Has patient had a PCN reaction occurring within the last 10 years: Yes If all of the above answers are "NO", then may proceed with Cephalosporin  use.    Farxiga [Dapagliflozin]     RECURRENT YEAST/UTI   Metformin And Related Diarrhea   Tape Rash

## 2021-05-15 NOTE — Telephone Encounter (Signed)
Can send script for doxycycline 100 mg bid for 7 days. 

## 2021-05-15 NOTE — Telephone Encounter (Signed)
I called and spoke with the pt and notified of response per Dr Halford Chessman  She verbalized understanding and ok with this plan  Rx was sent to pharm  She is aware to call us back if not improving

## 2021-05-18 ENCOUNTER — Other Ambulatory Visit: Payer: Self-pay | Admitting: Pulmonary Disease

## 2021-05-21 ENCOUNTER — Ambulatory Visit: Payer: Medicare Other | Admitting: Neurology

## 2021-05-21 VITALS — BP 135/72 | HR 84 | Ht 69.0 in | Wt 178.0 lb

## 2021-05-21 DIAGNOSIS — R269 Unspecified abnormalities of gait and mobility: Secondary | ICD-10-CM | POA: Diagnosis not present

## 2021-05-21 DIAGNOSIS — M7989 Other specified soft tissue disorders: Secondary | ICD-10-CM | POA: Insufficient documentation

## 2021-05-21 DIAGNOSIS — M542 Cervicalgia: Secondary | ICD-10-CM | POA: Insufficient documentation

## 2021-05-21 MED ORDER — DULOXETINE HCL 60 MG PO CPEP: 60.0000 mg | ORAL_CAPSULE | Freq: Every day | ORAL | 11 refills | Status: DC

## 2021-05-21 NOTE — Progress Notes (Signed)
ASSESSMENT AND PLAN 65 y.o. female   Progressive worsening numbness of bilateral lower extremity, reported gait abnormality  EMG nerve conduction study previously showed no large fiber peripheral neuropathy  MRI of lumbar showed only mild degenerative changes, no evidence of canal or foraminal narrowing  Hyperreflexia on examination, MRI of cervical spine to rule out cervical spondylitic myelopathy Right lower extremity swelling, out of proportion to left side, tenderness upon deep palpitation,  Venous Doppler to rule out right lower extremity DVT  DIAGNOSTIC DATA (LABS, IMAGING, TESTING) - I reviewed patient records, labs, notes, testing and imaging myself where available. MRI of lumbar spine July 2020, mild degenerative changes, no significant canal foraminal narrowing    HISTORICAL  Cynthia Deleon is a 65 year old female, seen in request by primary care nurse practitioner [name] for evaluation of double vision,  I reviewed and summarized the referring note.  Past medical history Bipolar disorder, polypharmacy treatment, Hyperlipidemia Diabetes, COPD Chronic urinary urgency Obstructive sleep apnea, using CPAP machine,  I saw her in 2018 for progressive bilateral lower extremity paresthesia since 2012, EMG nerve conduction study in November 2017 showed no large fiber peripheral neuropathy  The diagnosis of small fiber neuropathy was confirmed by skin biopsy in March 2018, there was reduced epidermal nerve fiber density  She reported few recurrent episode of double vision since September 2021, it will happen while driving, the dividing line between lanes becomes blurry, lasting for 30 minutes, she denies dizziness, has baseline chronic low back pain, mild unsteady gait, she denies lateralized motor or sensory deficit during the spell.  She was seen by ophthalmologist, was told normal  She denies ptosis, no bulbar weakness, no limb muscle weakness.  UPDATE May 21 2021: She stated she no longer has double vision, previous extensive evaluation including acetylcholine receptor antibody was negative  Normal MRI of the brain in October 2021  She continue complains of bilateral lower extremity numbness, involving lower extremity from knee down, also complains of low back pain, mild unsteady gait, denies bowel or bladder incontinence,  EMG nerve conduction study in 2017 was within normal limit, Skin biopsy in 2018 showed evidence of small fiber neuropathy  She also complains of few months history of right leg swelling, tenderness upon deep palpitation   PHYSICAL EXAM   Vitals:   05/21/21 1309  BP: 135/72  Pulse: 84  Weight: 178 lb (80.7 kg)  Height: 5\' 9"  (1.753 m)   Not recorded     Body mass index is 26.29 kg/m.  PHYSICAL EXAMNIATION:  Gen: NAD, conversant, well nourised, well groomed                     Cardiovascular: Regular rate rhythm, no peripheral edema, warm, nontender. Eyes: Conjunctivae clear without exudates or hemorrhage Neck: Supple, no carotid bruits. Pulmonary: Clear to auscultation bilaterally   NEUROLOGICAL EXAM:  MENTAL STATUS: Speech:    Speech is normal; fluent and spontaneous with normal comprehension.  Cognition:     Orientation to time, place and person     Normal recent and remote memory     Normal Attention span and concentration     Normal Language, naming, repeating,spontaneous speech     Fund of knowledge   CRANIAL NERVES: CN II: Visual fields are full to confrontation. Pupils are round equal and briskly reactive to light. CN III, IV, VI: extraocular movement are normal.  Cover and uncover showed no significant extraocular eye movement disorder, no ptosis. CN  V: Facial sensation is intact to light touch CN VII: Face is symmetric with normal eye closure  CN VIII: Hearing is normal to causal conversation. CN IX, X: Phonation is normal. CN XI: Head turning and shoulder shrug are intact  MOTOR: No  significant bilateral upper and lower extremity proximal distal muscle weakness, right lower extremity swelling, tenderness upon deep palpitation,  REFLEXES: Reflexes are 3 and symmetric at the biceps, triceps, knees, and ankles. Plantar responses are flexor.  SENSORY: Intact to light touch, pinprick and vibratory sensation are intact in fingers and toes.  COORDINATION: There is no trunk or limb dysmetria noted.  GAIT/STANCE: She needs push-up to get up from seated position, mildly unsteady, difficulty with tandem walking  REVIEW OF SYSTEMS: Full 14 system review of systems performed and notable only for as above All other review of systems were negative.  ALLERGIES: Allergies  Allergen Reactions   Chantix [Varenicline] Other (See Comments)    Altered mental status-Per patient was put on allergy list by Dr Lorriane Shire bu patient staes she is not allergic to this and is currently taking it.    Divalproex Sodium Other (See Comments)    Hallucinations  Other reaction(s): Confusion   Other Anaphylaxis   Pseudoeph-Hydrocodone-Gg Nausea Only   Sulfa Antibiotics Anaphylaxis and Nausea Only   Varenicline Tartrate Other (See Comments)    Other reaction(s): Confusion   Ambien [Zolpidem] Other (See Comments)    Causes sleep walking   Clavulanic Acid Diarrhea   Codeine Nausea And Vomiting   Elemental Sulfur Other (See Comments)    Swelling of tongue   Hydrocodone Nausea And Vomiting   Macrobid [Nitrofurantoin] Nausea And Vomiting    Dizziness, nausea, vomiting   Paxil [Paroxetine Hcl] Other (See Comments)    Causes ringing in the ears.    Amoxicillin Rash    Has patient had a PCN reaction causing immediate rash, facial/tongue/throat swelling, SOB or lightheadedness with hypotension: No Has patient had a PCN reaction causing severe rash involving mucus membranes or skin necrosis: No Has patient had a PCN reaction that required hospitalization: No Has patient had a PCN reaction occurring  within the last 10 years: Yes If all of the above answers are "NO", then may proceed with Cephalosporin use.    Wilder Glade [Dapagliflozin]     RECURRENT YEAST/UTI   Metformin And Related Diarrhea   Tape Rash    HOME MEDICATIONS: Current Outpatient Medications  Medication Sig Dispense Refill   albuterol (VENTOLIN HFA) 108 (90 Base) MCG/ACT inhaler Inhale 2 puffs into the lungs every 6 (six) hours as needed. 18 g 2   atorvastatin (LIPITOR) 40 MG tablet Take 1 tablet (40 mg total) by mouth daily. 90 tablet 1   azelastine (ASTELIN) 0.1 % nasal spray Place 1 spray into both nostrils 2 (two) times daily. Use in each nostril as directed 30 mL 12   budesonide-formoterol (SYMBICORT) 160-4.5 MCG/ACT inhaler Inhale 2 puffs into the lungs in the morning and at bedtime. 1 each 6   Continuous Blood Gluc Sensor (FREESTYLE LIBRE 2 SENSOR) MISC by Does not apply route.     cyclobenzaprine (FLEXERIL) 10 MG tablet      diazepam (VALIUM) 5 MG tablet Take 5 mg by mouth in the morning and at bedtime.      Dulaglutide (TRULICITY) 1.5 QT/6.2UQ SOPN Inject 1.5 mg into the skin once a week. STOP Januvia. Start after 0.75mg  sample packs 2 mL 6   fluticasone (FLONASE) 50 MCG/ACT nasal spray Place 2  sprays into both nostrils daily. 16 g 5   glipiZIDE (GLUCOTROL XL) 5 MG 24 hr tablet TAKE (1) TABLET BY MOUTH ONCE DAILY WITH EVENING MEAL. 90 tablet 1   glucose blood (CONTOUR NEXT TEST) test strip Test BS 4 times daily Dx E11.9 400 strip 3   lamoTRIgine (LAMICTAL) 200 MG tablet Take 200 mg by mouth at bedtime. For mood disorder.     lansoprazole (PREVACID) 30 MG capsule Take 1 capsule (30 mg total) by mouth daily at 12 noon. 30 capsule 12   levocetirizine (XYZAL) 5 MG tablet Take 1 tablet (5 mg total) by mouth every evening. 90 tablet 1   lisinopril (ZESTRIL) 5 MG tablet Take 1 tablet (5 mg total) by mouth daily. 90 tablet 1   meclizine (ANTIVERT) 25 MG tablet Take 2 tablets (50 mg total) by mouth daily as needed for  dizziness. 60 tablet 2   montelukast (SINGULAIR) 10 MG tablet TAKE (1) TABLET BY MOUTH AT BEDTIME. 90 tablet 1   ondansetron (ZOFRAN-ODT) 4 MG disintegrating tablet DISSOLVE 1 TABLET BY MOUTH EVERY 8 HOURS AS NEEDED FOR NAUSEA OR VOMITING. 20 tablet 0   OneTouch Delica Lancets 51Z MISC Use to test blood sugar twice daily. DX: E11.9 100 each 3   oxybutynin (DITROPAN-XL) 10 MG 24 hr tablet TAKE (1) TABLET BY MOUTH AT BEDTIME. 90 tablet 1   oxyCODONE (ROXICODONE) 15 MG immediate release tablet Take 7.5 mg by mouth every 6 (six) hours as needed.      pregabalin (LYRICA) 150 MG capsule Take 1 capsule (150 mg total) by mouth 2 (two) times daily. 180 capsule 1   PREMARIN 0.3 MG tablet TAKE (1) TABLET BY MOUTH ONCE DAILY. 30 tablet 2   valACYclovir (VALTREX) 500 MG tablet Take 1 tablet (500 mg total) by mouth daily as needed (shingles flare up). 30 tablet 4   temazepam (RESTORIL) 15 MG capsule Take 15 mg by mouth at bedtime.     traZODone (DESYREL) 100 MG tablet Take 100 mg by mouth at bedtime.     No current facility-administered medications for this visit.    PAST MEDICAL HISTORY: Past Medical History:  Diagnosis Date   Arthritis    hands and knees   Bipolar 1 disorder ([place])    Borderline diabetic    Chronic back pain    Chronic neck pain    Complication of anesthesia    high anxiety-does not want to be alone   COPD (chronic obstructive pulmonary disease) ([place])    Current use of estrogen therapy 01/12/2014   Depression    Diabetes mellitus without complication ([place])    Diplopia    Family history of adverse reaction to anesthesia    sister "gas in lungs"   GERD (gastroesophageal reflux disease)    Headache    Hot flashes 12/15/2013   Hypertension    IBS (irritable bowel syndrome)    Incontinence    Kidney stone    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[place])    Panic attacks    Peptic ulcer    Peripheral neuropathy    Rosacea    Shingles    Sleep apnea    uses a cpap-with oxygen    PAST  SURGICAL HISTORY: Past Surgical History:  Procedure Laterality Date   BIOPSY  04/27/2020   Procedure: BIOPSY;  Surgeon: Ronald Lobo, MD;  Location: [facility];  Service: Endoscopy;;   BUNIONECTOMY WITH HAMMERTOE RECONSTRUCTION Right 12/10/2012   Procedure: RIGHT FIRST METATARSAL CHEVRON BUNION CORRECTION,  2  AND 3 HAMMERTOE CORRECTION , RIGHT 3 AND 4 TOE NAIL EXCISION ;  Surgeon: Wylene Simmer, MD;  Location: [facility];  Service: Orthopedics;  Laterality: Right;   COLONOSCOPY WITH PROPOFOL N/A 04/27/2020   Procedure: COLONOSCOPY WITH PROPOFOL;  Surgeon: Ronald Lobo, MD;  Location: [facility];  Service: Endoscopy;  Laterality: N/A;   FOOT ARTHRODESIS  2000   both feet   LIPOSUCTION  03/2018   RECTAL SURGERY     TONSILLECTOMY     TOTAL ABDOMINAL HYSTERECTOMY      FAMILY HISTORY: Family History  Problem Relation Age of Onset   Diabetes Mother    Other Mother        vertigo; chronic eye disease   COPD Father    COPD Sister    Alcohol abuse Brother    Diabetes Maternal Grandmother    Diabetes Maternal Grandfather    Diabetes Sister    Other Sister        hearing problems   Hyperlipidemia Sister    Alcohol abuse Brother    COPD Brother    Alcohol abuse Brother    Alcohol abuse Brother    Other Brother        aneursym    SOCIAL HISTORY: Social History   Socioeconomic History   Marital status: Divorced    Spouse name: Not on file   Number of children: 5   Years of education: 9th   Highest education level: Not on file  Occupational History   Occupation: Disabled  Tobacco Use   Smoking status: Former    Packs/day: 0.50    Years: 35.00    Pack years: 17.50    Types: Cigarettes    Quit date: 05/11/2019    Years since quitting: 2.0   Smokeless tobacco: Never  Vaping Use   Vaping Use: Never used  Substance and Sexual Activity   Alcohol use: No    Alcohol/week: 0.0 standard drinks   Drug use: No   Sexual activity: Never    Birth  control/protection: Surgical    Comment: occ cigarette  Other Topics Concern   Not on file  Social History Narrative   Lives at home with home with her son (has five children).   Right-handed.   1 cup caffeine per day.   Social Determinants of Health   Financial Resource Strain: Not on file  Food Insecurity: Not on file  Transportation Needs: Not on file  Physical Activity: Not on file  Stress: Not on file  Social Connections: Not on file  Intimate Partner Violence: Not on file      Cynthia Deleon, M.D. Ph.D.  [facility] [address] [place], [place] [address] Ph: [phone] Fax: [phone]  CC:  Loman Brooklyn, [place],  [place] [address]  Loman Brooklyn, FNP

## 2021-05-22 ENCOUNTER — Telehealth: Payer: Self-pay | Admitting: Neurology

## 2021-05-22 DIAGNOSIS — E1165 Type 2 diabetes mellitus with hyperglycemia: Secondary | ICD-10-CM | POA: Diagnosis not present

## 2021-05-22 NOTE — Telephone Encounter (Signed)
[employer] medicare no auth req-sent [facility] for scheduling, they will reach out to the patient to schedule.

## 2021-05-23 DIAGNOSIS — R051 Acute cough: Secondary | ICD-10-CM | POA: Diagnosis not present

## 2021-05-23 DIAGNOSIS — J329 Chronic sinusitis, unspecified: Secondary | ICD-10-CM | POA: Diagnosis not present

## 2021-05-23 DIAGNOSIS — R0789 Other chest pain: Secondary | ICD-10-CM | POA: Diagnosis not present

## 2021-05-23 DIAGNOSIS — J441 Chronic obstructive pulmonary disease with (acute) exacerbation: Secondary | ICD-10-CM | POA: Diagnosis not present

## 2021-05-23 DIAGNOSIS — G44209 Tension-type headache, unspecified, not intractable: Secondary | ICD-10-CM | POA: Diagnosis not present

## 2021-05-24 ENCOUNTER — Ambulatory Visit (INDEPENDENT_AMBULATORY_CARE_PROVIDER_SITE_OTHER): Payer: Medicare Other

## 2021-05-24 ENCOUNTER — Ambulatory Visit (INDEPENDENT_AMBULATORY_CARE_PROVIDER_SITE_OTHER): Payer: Medicare Other | Admitting: Family Medicine

## 2021-05-24 ENCOUNTER — Encounter: Payer: Self-pay | Admitting: Family Medicine

## 2021-05-24 ENCOUNTER — Encounter: Payer: Self-pay | Admitting: Neurology

## 2021-05-24 VITALS — BP 147/84 | HR 89 | Temp 97.8°F | Ht 69.0 in | Wt 178.0 lb

## 2021-05-24 DIAGNOSIS — J441 Chronic obstructive pulmonary disease with (acute) exacerbation: Secondary | ICD-10-CM | POA: Diagnosis not present

## 2021-05-24 DIAGNOSIS — R051 Acute cough: Secondary | ICD-10-CM

## 2021-05-24 DIAGNOSIS — J189 Pneumonia, unspecified organism: Secondary | ICD-10-CM

## 2021-05-24 DIAGNOSIS — J9811 Atelectasis: Secondary | ICD-10-CM | POA: Diagnosis not present

## 2021-05-24 DIAGNOSIS — R059 Cough, unspecified: Secondary | ICD-10-CM | POA: Diagnosis not present

## 2021-05-24 MED ORDER — LEVOFLOXACIN 750 MG PO TABS: 750.0000 mg | ORAL_TABLET | Freq: Every day | ORAL | 0 refills | Status: DC

## 2021-05-24 NOTE — Addendum Note (Signed)
Addended by: [name] F on: 05/24/2021 11:45 AM   Modules accepted: Orders

## 2021-05-24 NOTE — Progress Notes (Addendum)
Assessment & Plan:  1-2. Chronic obstructive pulmonary disease with acute exacerbation (HCC)/Acute cough - continue prednisone, diflucan, and tessalon pearls as prescribed - DG Chest 2 View  3. Pneumonia of right lower lobe due to infectious organism Stop Azithromycin, start Levaquin due to pneumonia. Unable to add Augmentin due to allergies. - levofloxacin (LEVAQUIN) 750 MG tablet; Take 1 tablet (750 mg total) by mouth daily for 5 days.  Dispense: 5 tablet; Refill: 0   Follow up plan: Return as scheduled.  Lucile Crater, NP Student  I personally was present during the history, physical exam, and medical decision-making activities of this service and have verified that the service and findings are accurately documented in the nurse practitioner student's note.  [name], MSN, APRN, FNP-C [facility]  Subjective:   [name], female    DOB: [birth year], 65 y.o.   MRN: [id number]  HPI: Cynthia Deleon is a 65 y.o. female presenting on 05/24/2021 for Follow-up [facility] 12/14- cough & nasal congestion. Would like xray )  Patient has had ongoing lower respiratory tract symptoms including chest congestion, cough, mucus, chest wall pain, and headaches from coughing for 2 weeks. She was initially given 100 mg Doxycyline x 7 days on 12/6 by her pulmonologist, which did not improve her symptoms. She went to an urgent care yesterday and received a course of prednisone, azithromycin, tessalon pearls, and diflucan. She states she is starting to feel better today. She is here today only because they were unable to do a chest xray at the urgent care yesterday and she wants to make sure she doesn't have pneumonia. She states she has been using her COPD medications as prescribed and has been using her albuterol at least 2x per day during this acute illness.   ROS: Negative unless specifically indicated above in HPI.   Relevant past medical  history reviewed and updated as indicated.   Allergies and medications reviewed and updated.   Current Outpatient Medications:    albuterol (VENTOLIN HFA) 108 (90 Base) MCG/ACT inhaler, Inhale 2 puffs into the lungs every 6 (six) hours as needed., Disp: 18 g, Rfl: 2   atorvastatin (LIPITOR) 40 MG tablet, Take 1 tablet (40 mg total) by mouth daily., Disp: 90 tablet, Rfl: 1   azelastine (ASTELIN) 0.1 % nasal spray, Place 1 spray into both nostrils 2 (two) times daily. Use in each nostril as directed, Disp: 30 mL, Rfl: 12   azithromycin (ZITHROMAX) 250 MG tablet, Take 2 tablets (500 mg) on  Day 1,  followed by 1 tablet (250 mg) once daily on Days 2 through 5., Disp: , Rfl:    benzonatate (TESSALON) 100 MG capsule, Take 200 mg by mouth 3 (three) times daily as needed., Disp: , Rfl:    budesonide-formoterol (SYMBICORT) 160-4.5 MCG/ACT inhaler, Inhale 2 puffs into the lungs in the morning and at bedtime., Disp: 1 each, Rfl: 6   Continuous Blood Gluc Sensor (FREESTYLE LIBRE 2 SENSOR) MISC, by Does not apply route., Disp: , Rfl:    cyclobenzaprine (FLEXERIL) 10 MG tablet, , Disp: , Rfl:    diazepam (VALIUM) 5 MG tablet, Take 5 mg by mouth in the morning and at bedtime. , Disp: , Rfl:    Dulaglutide (TRULICITY) 1.5 NI/6.2VO SOPN, Inject 1.5 mg into the skin once a week. STOP Januvia. Start after 0.75mg  sample packs, Disp: 2 mL, Rfl: 6   DULoxetine (CYMBALTA) 60 MG capsule, Take 1 capsule (60 mg total) by  mouth daily., Disp: 30 capsule, Rfl: 11   fluconazole (DIFLUCAN) 150 MG tablet, Take 150 mg by mouth daily., Disp: , Rfl:    fluticasone (FLONASE) 50 MCG/ACT nasal spray, Place 2 sprays into both nostrils daily., Disp: 16 g, Rfl: 5   fluticasone (FLONASE) 50 MCG/ACT nasal spray, Place into the nose., Disp: , Rfl:    glipiZIDE (GLUCOTROL XL) 5 MG 24 hr tablet, TAKE (1) TABLET BY MOUTH ONCE DAILY WITH EVENING MEAL., Disp: 90 tablet, Rfl: 1   glucose blood (CONTOUR NEXT TEST) test strip, Test BS 4 times  daily Dx E11.9, Disp: 400 strip, Rfl: 3   lamoTRIgine (LAMICTAL) 200 MG tablet, Take 200 mg by mouth at bedtime. For mood disorder., Disp: , Rfl:    lansoprazole (PREVACID) 30 MG capsule, Take 1 capsule (30 mg total) by mouth daily at 12 noon., Disp: 30 capsule, Rfl: 12   levocetirizine (XYZAL) 5 MG tablet, Take 1 tablet (5 mg total) by mouth every evening., Disp: 90 tablet, Rfl: 1   lisinopril (ZESTRIL) 5 MG tablet, Take 1 tablet (5 mg total) by mouth daily., Disp: 90 tablet, Rfl: 1   meclizine (ANTIVERT) 25 MG tablet, Take 2 tablets (50 mg total) by mouth daily as needed for dizziness., Disp: 60 tablet, Rfl: 2   montelukast (SINGULAIR) 10 MG tablet, TAKE (1) TABLET BY MOUTH AT BEDTIME., Disp: 90 tablet, Rfl: 1   ondansetron (ZOFRAN-ODT) 4 MG disintegrating tablet, DISSOLVE 1 TABLET BY MOUTH EVERY 8 HOURS AS NEEDED FOR NAUSEA OR VOMITING., Disp: 20 tablet, Rfl: 0   OneTouch Delica Lancets 44Y MISC, Use to test blood sugar twice daily. DX: E11.9, Disp: 100 each, Rfl: 3   oxybutynin (DITROPAN-XL) 10 MG 24 hr tablet, TAKE (1) TABLET BY MOUTH AT BEDTIME., Disp: 90 tablet, Rfl: 1   oxyCODONE (ROXICODONE) 15 MG immediate release tablet, Take 7.5 mg by mouth every 6 (six) hours as needed. , Disp: , Rfl:    pregabalin (LYRICA) 150 MG capsule, Take 1 capsule (150 mg total) by mouth 2 (two) times daily., Disp: 180 capsule, Rfl: 1   PREMARIN 0.3 MG tablet, TAKE (1) TABLET BY MOUTH ONCE DAILY., Disp: 30 tablet, Rfl: 2   temazepam (RESTORIL) 15 MG capsule, Take 15 mg by mouth at bedtime., Disp: , Rfl:    traZODone (DESYREL) 100 MG tablet, Take 100 mg by mouth at bedtime., Disp: , Rfl:    valACYclovir (VALTREX) 500 MG tablet, Take 1 tablet (500 mg total) by mouth daily as needed (shingles flare up)., Disp: 30 tablet, Rfl: 4  Allergies  Allergen Reactions   Chantix [Varenicline] Other (See Comments)    Altered mental status-Per patient was put on allergy list by Dr Lorriane Shire bu patient staes she is not  allergic to this and is currently taking it.    Divalproex Sodium Other (See Comments)    Hallucinations  Other reaction(s): Confusion   Other Anaphylaxis   Pseudoeph-Hydrocodone-Gg Nausea Only   Sulfa Antibiotics Anaphylaxis and Nausea Only   Varenicline Tartrate Other (See Comments)    Other reaction(s): Confusion   Ambien [Zolpidem] Other (See Comments)    Causes sleep walking   Clavulanic Acid Diarrhea   Codeine Nausea And Vomiting   Elemental Sulfur Other (See Comments)    Swelling of tongue   Hydrocodone Nausea And Vomiting   Macrobid [Nitrofurantoin] Nausea And Vomiting    Dizziness, nausea, vomiting   Paxil [Paroxetine Hcl] Other (See Comments)    Causes ringing in the ears.  Amoxicillin Rash    Has patient had a PCN reaction causing immediate rash, facial/tongue/throat swelling, SOB or lightheadedness with hypotension: No Has patient had a PCN reaction causing severe rash involving mucus membranes or skin necrosis: No Has patient had a PCN reaction that required hospitalization: No Has patient had a PCN reaction occurring within the last 10 years: Yes If all of the above answers are "NO", then may proceed with Cephalosporin use.    Farxiga [Dapagliflozin]     RECURRENT YEAST/UTI   Metformin And Related Diarrhea   Tape Rash    Objective:   BP (!) 147/84    Pulse 89    Temp 97.8 F (36.6 C) (Temporal)    Ht 5\' 9"  (1.753 m)    Wt 80.7 kg    SpO2 90%    BMI 26.29 kg/m    Physical Exam Vitals reviewed.  Constitutional:      General: She is not in acute distress.    Appearance: Normal appearance. She is not ill-appearing, toxic-appearing or diaphoretic.  HENT:     Head: Normocephalic and atraumatic.     Nose: Nose normal.     Mouth/Throat:     Mouth: Mucous membranes are moist.     Pharynx: Oropharynx is clear.  Eyes:     Extraocular Movements: Extraocular movements intact.     Conjunctiva/sclera: Conjunctivae normal.     Pupils: Pupils are equal, round, and  reactive to light.  Cardiovascular:     Rate and Rhythm: Normal rate and regular rhythm.     Pulses: Normal pulses.     Heart sounds: Normal heart sounds.  Pulmonary:     Effort: Pulmonary effort is normal. No respiratory distress.     Breath sounds: Wheezing and rhonchi present.  Chest:     Chest wall: Tenderness present.  Abdominal:     General: There is no distension.     Palpations: Abdomen is soft. There is no mass.  Musculoskeletal:        General: Normal range of motion.     Cervical back: Normal range of motion.  Skin:    General: Skin is warm and dry.  Neurological:     General: No focal deficit present.     Mental Status: She is alert and oriented to person, place, and time.     Motor: No weakness.     Gait: Gait normal.  Psychiatric:        Mood and Affect: Mood normal.        Behavior: Behavior normal.        Thought Content: Thought content normal.        Judgment: Judgment normal.

## 2021-05-28 ENCOUNTER — Encounter: Payer: Self-pay | Admitting: Pulmonary Disease

## 2021-05-28 ENCOUNTER — Ambulatory Visit: Payer: Medicare Other | Admitting: Pulmonary Disease

## 2021-05-28 ENCOUNTER — Other Ambulatory Visit: Payer: Self-pay

## 2021-05-28 VITALS — BP 132/80 | HR 82 | Temp 98.4°F | Ht 69.0 in | Wt 177.0 lb

## 2021-05-28 DIAGNOSIS — G4733 Obstructive sleep apnea (adult) (pediatric): Secondary | ICD-10-CM | POA: Diagnosis not present

## 2021-05-28 MED ORDER — LORATADINE 10 MG PO TABS: 10.0000 mg | ORAL_TABLET | Freq: Every day | ORAL | 11 refills | Status: DC

## 2021-05-28 NOTE — Progress Notes (Signed)
Pulmonary, Critical Care, and Sleep Medicine  Chief Complaint  Patient presents with   Follow-up    F/u to discuss sleep study.    Had to go to urgent care last week in [place] for resp symptoms.     Constitutional:  BP 132/80    Pulse 82    Temp 98.4 F (36.9 C) (Temporal)    Ht 5\' 9"  (1.753 m)    Wt 177 lb (80.3 kg)    SpO2 97% Comment: ra   BMI 26.14 kg/m   Past Medical History:  OA, Bipolar, DM, Back pain, Depression, Diplopia, GERD, Headache, HTN, IBS, Nephrolithiasis, Multiple personality disorder, Panic attacks, PUD, Neuropathy, Shingles, Rosacea  Past Surgical History:  She  has a past surgical history that includes Tonsillectomy; Foot arthrodesis (2000); Bunionectomy with hammertoe reconstruction (Right, 12/10/2012); Rectal surgery; Liposuction (03/2018); Colonoscopy with propofol (N/A, 04/27/2020); biopsy (04/27/2020); and Total abdominal hysterectomy.  Brief Summary:  Cynthia Deleon is a 65 y.o. female former smoker with dyspnea and obstructive sleep apnea.       Subjective:   She had sleep study in November.  Showed mild sleep apnea with low oxygen.  She had trouble with cough, dyspnea, and wheeze earlier this month.  Called in for script of doxycyline.  She then went to urgent care and got a script for prednisone and zpak.  She saw PCP on 05/24/21 and CXR was concerning for possible pneumonia.  She was changed to levaquin, but never picked this up.  Cough and breathing better. Still has sinus congestion.  She doesn't think xyzal is working anymore.  She isn't sure which inhaler she has at home and whether she is still taking singulair.  Physical Exam:   Appearance - well kempt   ENMT - no sinus tenderness, no oral exudate, no LAN, Mallampati 3 airway, no stridor  Respiratory - equal breath sounds bilaterally, no wheezing or rales  CV - s1s2 regular rate and rhythm, no murmurs  Ext - no clubbing, no edema  Skin - no rashes  Psych - normal mood  and affect     Pulmonary testing:  PFT 05/13/19 >> FEV1 2.06 (69%), FEV1% 78, TLC 4.56 (78%), DLCO 97%, +BD  Chest Imaging:  LDCT chest 04/20/19 >> mild centrilobular emphysema, atherosclerosis, 3 mm nodule RML, fatty liver  Sleep Tests:  PSG 04/30/21 >> AHI 6.5, SpO2 low 84%; spent 162.5 min with an SpO2 < 88%  Cardiac Tests:  Echo 02/17/18 >> EF 55 to 60%, grade 1 DD  Social History:  She  reports that she quit smoking about 2 years ago. Her smoking use included cigarettes. She has a 17.50 pack-year smoking history. She has never used smokeless tobacco. She reports that she does not drink alcohol and does not use drugs.  Family History:  Her family history includes Alcohol abuse in her brother, brother, brother, and brother; COPD in her brother, father, and sister; Diabetes in her maternal grandfather, maternal grandmother, mother, and sister; Hyperlipidemia in her sister; Other in her brother, mother, and sister.     Assessment/Plan:   Allergic asthma and emphysema. - she is supposed to be on symbicort and singulair; she will call the office to verify what she is using - prn albuterol - she has spacer device  Perennial allergic rhinitis. - change from xzyal to claritin - continue flonase and azelastine  History of tobacco abuse. - will need to discuss scheduling LDCT at next follow up  Obstructive sleep apnea. -  reviewed her sleep study - discussed treatment options - will arrange for in lab sleep study determine whether she should resume CPAP, or if she needs supplemental oxygen or Bipap at night  Recurrent episodes of thrush and vaginal candidiasis. - she usually needs a course of diflucan when she takes antibiotics  Time Spent Involved in Patient Care on Day of Examination:  32 minutes  Follow up:   Patient Instructions  Can change to claritin 10 mg pill for allergies.  Check if you are using symbicort inhaler and montelukast (singulair) pill for allergies  and asthma, and call the office to let us know.  Will arrange for in lab sleep study and schedule follow up after this is reviewed  Medication List:   Allergies as of 05/28/2021       Reactions   Chantix [varenicline] Other (See Comments)   Altered mental status-Per patient was put on allergy list by Dr Lorriane Shire bu patient staes she is not allergic to this and is currently taking it.    Divalproex Sodium Other (See Comments)   Hallucinations  Other reaction(s): Confusion   Other Anaphylaxis   Pseudoeph-hydrocodone-gg Nausea Only   Sulfa Antibiotics Anaphylaxis, Nausea Only   Varenicline Tartrate Other (See Comments)   Other reaction(s): Confusion   Ambien [zolpidem] Other (See Comments)   Causes sleep walking   Clavulanic Acid Diarrhea   Codeine Nausea And Vomiting   Elemental Sulfur Other (See Comments)   Swelling of tongue   Hydrocodone Nausea And Vomiting   Macrobid [nitrofurantoin] Nausea And Vomiting   Dizziness, nausea, vomiting   Paxil [paroxetine Hcl] Other (See Comments)   Causes ringing in the ears.    Amoxicillin Rash   Has patient had a PCN reaction causing immediate rash, facial/tongue/throat swelling, SOB or lightheadedness with hypotension: No Has patient had a PCN reaction causing severe rash involving mucus membranes or skin necrosis: No Has patient had a PCN reaction that required hospitalization: No Has patient had a PCN reaction occurring within the last 10 years: Yes If all of the above answers are "NO", then may proceed with Cephalosporin use.   Farxiga [dapagliflozin]    RECURRENT YEAST/UTI   Metformin And Related Diarrhea   Tape Rash        Medication List        Accurate as of May 28, 2021  9:27 AM. If you have any questions, ask your nurse or doctor.          STOP taking these medications    azithromycin 250 MG tablet Commonly known as: ZITHROMAX Stopped by: Chesley Mires, MD   fluconazole 150 MG tablet Commonly known as:  DIFLUCAN Stopped by: Chesley Mires, MD   levocetirizine 5 MG tablet Commonly known as: XYZAL Stopped by: Chesley Mires, MD   levofloxacin 750 MG tablet Commonly known as: Levaquin Stopped by: Chesley Mires, MD       TAKE these medications    albuterol 108 (90 Base) MCG/ACT inhaler Commonly known as: VENTOLIN HFA Inhale 2 puffs into the lungs every 6 (six) hours as needed.   atorvastatin 40 MG tablet Commonly known as: LIPITOR Take 1 tablet (40 mg total) by mouth daily.   azelastine 0.1 % nasal spray Commonly known as: ASTELIN Place 1 spray into both nostrils 2 (two) times daily. Use in each nostril as directed   benzonatate 100 MG capsule Commonly known as: TESSALON Take 200 mg by mouth 3 (three) times daily as needed.   budesonide-formoterol 160-4.5 MCG/ACT  inhaler Commonly known as: Symbicort Inhale 2 puffs into the lungs in the morning and at bedtime.   Contour Next Test test strip Generic drug: glucose blood Test BS 4 times daily Dx E11.9   cyclobenzaprine 10 MG tablet Commonly known as: FLEXERIL   diazepam 5 MG tablet Commonly known as: VALIUM Take 5 mg by mouth in the morning and at bedtime.   DULoxetine 60 MG capsule Commonly known as: Cymbalta Take 1 capsule (60 mg total) by mouth daily.   fluticasone 50 MCG/ACT nasal spray Commonly known as: FLONASE Place 2 sprays into both nostrils daily.   fluticasone 50 MCG/ACT nasal spray Commonly known as: FLONASE Place into the nose.   FreeStyle Libre 2 Sensor Misc by Does not apply route.   glipiZIDE 5 MG 24 hr tablet Commonly known as: GLUCOTROL XL TAKE (1) TABLET BY MOUTH ONCE DAILY WITH EVENING MEAL.   lamoTRIgine 200 MG tablet Commonly known as: LAMICTAL Take 200 mg by mouth at bedtime. For mood disorder.   lansoprazole 30 MG capsule Commonly known as: PREVACID Take 1 capsule (30 mg total) by mouth daily at 12 noon.   lisinopril 5 MG tablet Commonly known as: ZESTRIL Take 1 tablet (5 mg total)  by mouth daily.   loratadine 10 MG tablet Commonly known as: CLARITIN Take 1 tablet (10 mg total) by mouth daily. Started by: Chesley Mires, MD   meclizine 25 MG tablet Commonly known as: ANTIVERT Take 2 tablets (50 mg total) by mouth daily as needed for dizziness.   montelukast 10 MG tablet Commonly known as: SINGULAIR TAKE (1) TABLET BY MOUTH AT BEDTIME.   ondansetron 4 MG disintegrating tablet Commonly known as: ZOFRAN-ODT DISSOLVE 1 TABLET BY MOUTH EVERY 8 HOURS AS NEEDED FOR NAUSEA OR VOMITING.   OneTouch Delica Lancets 10F Misc Use to test blood sugar twice daily. DX: E11.9   oxybutynin 10 MG 24 hr tablet Commonly known as: DITROPAN-XL TAKE (1) TABLET BY MOUTH AT BEDTIME.   oxyCODONE 15 MG immediate release tablet Commonly known as: ROXICODONE Take 7.5 mg by mouth every 6 (six) hours as needed.   pregabalin 150 MG capsule Commonly known as: LYRICA Take 1 capsule (150 mg total) by mouth 2 (two) times daily.   Premarin 0.3 MG tablet Generic drug: estrogens (conjugated) TAKE (1) TABLET BY MOUTH ONCE DAILY.   temazepam 15 MG capsule Commonly known as: RESTORIL Take 15 mg by mouth at bedtime.   traZODone 100 MG tablet Commonly known as: DESYREL Take 100 mg by mouth at bedtime.   Trulicity 1.5 BP/1.0CH Sopn Generic drug: Dulaglutide Inject 1.5 mg into the skin once a week. STOP Januvia. Start after 0.75mg  sample packs   valACYclovir 500 MG tablet Commonly known as: VALTREX Take 1 tablet (500 mg total) by mouth daily as needed (shingles flare up).        Signature:  Chesley Mires, MD [place] Pager - [phone] 05/28/2021, 9:27 AM

## 2021-05-28 NOTE — Patient Instructions (Signed)
Can change to claritin 10 mg pill for allergies.  Check if you are using symbicort inhaler and montelukast (singulair) pill for allergies and asthma, and call the office to let us know.  Will arrange for in lab sleep study and schedule follow up after this is reviewed

## 2021-05-29 ENCOUNTER — Ambulatory Visit: Payer: Medicare Other | Admitting: Family Medicine

## 2021-05-31 DIAGNOSIS — E1142 Type 2 diabetes mellitus with diabetic polyneuropathy: Secondary | ICD-10-CM | POA: Diagnosis not present

## 2021-05-31 DIAGNOSIS — E11649 Type 2 diabetes mellitus with hypoglycemia without coma: Secondary | ICD-10-CM | POA: Diagnosis not present

## 2021-06-05 ENCOUNTER — Encounter (HOSPITAL_COMMUNITY): Payer: Self-pay

## 2021-06-05 ENCOUNTER — Ambulatory Visit (HOSPITAL_COMMUNITY): Payer: Medicare Other | Attending: Neurology

## 2021-06-14 ENCOUNTER — Ambulatory Visit: Payer: 59 | Admitting: Physician Assistant

## 2021-06-15 ENCOUNTER — Other Ambulatory Visit: Payer: Self-pay

## 2021-06-15 ENCOUNTER — Ambulatory Visit (INDEPENDENT_AMBULATORY_CARE_PROVIDER_SITE_OTHER): Payer: Medicare Other | Admitting: Pharmacist

## 2021-06-15 ENCOUNTER — Ambulatory Visit (HOSPITAL_COMMUNITY)
Admission: RE | Admit: 2021-06-15 | Discharge: 2021-06-15 | Disposition: A | Discharge status: Home / Self Care | Payer: Medicare Other | Source: Ambulatory Visit | Attending: Neurology | Admitting: Neurology

## 2021-06-15 VITALS — BP 120/76

## 2021-06-15 DIAGNOSIS — M542 Cervicalgia: Secondary | ICD-10-CM | POA: Insufficient documentation

## 2021-06-15 DIAGNOSIS — M7989 Other specified soft tissue disorders: Secondary | ICD-10-CM | POA: Diagnosis not present

## 2021-06-15 DIAGNOSIS — E11649 Type 2 diabetes mellitus with hypoglycemia without coma: Secondary | ICD-10-CM

## 2021-06-15 DIAGNOSIS — M5021 Other cervical disc displacement,  high cervical region: Secondary | ICD-10-CM | POA: Diagnosis not present

## 2021-06-15 DIAGNOSIS — I10 Essential (primary) hypertension: Secondary | ICD-10-CM

## 2021-06-15 DIAGNOSIS — M50222 Other cervical disc displacement at C5-C6 level: Secondary | ICD-10-CM | POA: Diagnosis not present

## 2021-06-15 DIAGNOSIS — M50221 Other cervical disc displacement at C4-C5 level: Secondary | ICD-10-CM | POA: Diagnosis not present

## 2021-06-15 DIAGNOSIS — M50223 Other cervical disc displacement at C6-C7 level: Secondary | ICD-10-CM | POA: Diagnosis not present

## 2021-06-15 DIAGNOSIS — R269 Unspecified abnormalities of gait and mobility: Secondary | ICD-10-CM | POA: Insufficient documentation

## 2021-06-15 MED ORDER — OZEMPIC (0.25 OR 0.5 MG/DOSE) 2 MG/1.5ML ~~LOC~~ SOPN: 0.5000 mg | PEN_INJECTOR | SUBCUTANEOUS | 3 refills | Status: DC

## 2021-06-15 NOTE — Progress Notes (Signed)
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) DISCONTINUED FARXIGA DUE TO RECURRENT UTI/YEAST INFECTIONS DESPITE CONTROLLED SUGARS STOP trulicity--patient reports it is having the opposite effects on her (gaining weight, eating more?) CONTINUE GLIPIZIDE (ONLY SHORT TERM, WILL plan to D/C) START OZEMPIC 0.5MG SQ WEEKLY Denies personal and family history of Medullary thyroid cancer (MTC) Blood sugars have decreased <200s per patient report-- [name] will also help [place] understand patterns Current glucose readings: fasting glucose: 200s, post prandial glucose: 200-300s Patient received her Libre 2 in the mail--[employer]V [employer]IA [place] Extensive education provided on FS libre 2 CGM system; pt able to demonstrated and teachback INSTRUCTED HOW TO ORDER REFILLS via adv dm supply Telephone: [phone] Denies hypoglycemic/hyperglycemic symptoms Discussed meal planning options and Plate method for healthy eating--Patient admits to not eating wisely, she is trying to work on this Avoid sugary drinks and desserts Incorporate balanced protein, non starchy veggies, 1 serving of carbohydrate with each meal Increase water intake Increase physical activity as able Current exercise: active job  Educated on current medications, patient on pill packaging with [facility]--discussed D/C OF [place] Recommended SWITCH TO OZEMPIC, LIBRE  Discussed Blood pressure and cholesterol--stable, continue current medications as prescribed, patient using pill packs monthly May titrate/switch statin if LDL not at goal at f/u-->would prefer rosuvastatin vs atorva (pt less likely to have myopathy which she sometimes  reports)  Patient Goals/Self-Care Activities Over the next 90 days, patient will:  - take medications as prescribed check glucose daily (fasting) or if symptomatic, document, and provide at future appointments  Follow Up Plan: Telephone follow up appointment with care management team member scheduled for: 1 month   Plan:  Subjective: Cynthia Deleon is an 66 y.o. year old female who is a primary patient of Loman Brooklyn, FNP.  The CCM team was consulted for assistance with disease management and care coordination needs.    Engaged with patient by telephone for follow up visit in response to provider referral for pharmacy case management and/or care coordination services.   Consent to Services:  The patient was given information about Chronic Care Management services, agreed to services, and gave verbal consent prior to initiation of services.  Please see initial visit note for detailed documentation.   Patient Care Team: Loman Brooklyn, FNP as PCP - General (Family Medicine) Suella Broad, MD as Consulting Physician (Physical Medicine and Rehabilitation) Norma Fredrickson, MD as Consulting Physician (Psychiatry) Ilean China, RN as Registered Nurse Phillips Odor, MD as Consulting Physician (Neurology) Lavera Guise, [facility] (Pharmacist) Warren Danes, PA-C as Physician Assistant (Dermatology) Madelin Headings, DO (Optometry)  Objective:  Lab Results  Component Value Date   CREATININE 0.79 03/29/2021   CREATININE 0.84 10/10/2020   CREATININE 0.72 06/08/2020    Lab Results  Component Value Date   HGBA1C 7.4 (H) 03/08/2021   Last diabetic Eye exam:  Lab Results  Component Value Date/Time   HMDIABEYEEXA No Retinopathy 07/25/2020 12:00 AM    Last diabetic Foot exam: No results found for: HMDIABFOOTEX      Component Value Date/Time   CHOL 169 03/29/2021 1237   TRIG 118 03/29/2021 1237   HDL 52 03/29/2021 1237   CHOLHDL 3.3 03/29/2021 1237   CHOLHDL 4.1  11/16/2015 0715  VLDL 48 (H) 11/16/2015 0715   LDLCALC 96 03/29/2021 1237    Hepatic Function Latest Ref Rng & Units 03/29/2021 10/10/2020 03/07/2020  Total Protein 6.0 - 8.5 g/dL 6.4 6.7 6.3  Albumin 3.8 - 4.8 g/dL 4.6 5.1(H) 4.6  AST 0 - 40 IU/L '20 18 11  ' ALT 0 - 32 IU/L '13 13 7  ' Alk Phosphatase 44 - 121 IU/L 66 55 68  Total Bilirubin 0.0 - 1.2 mg/dL 0.6 0.7 0.4  Bilirubin, Direct 0.1 - 0.5 mg/dL - - -    Lab Results  Component Value Date/Time   TSH 1.220 07/04/2020 09:58 AM   TSH 1.060 03/07/2020 08:52 AM    CBC Latest Ref Rng & Units 03/29/2021 10/10/2020 06/08/2020  WBC 3.4 - 10.8 x10E3/uL 8.2 6.5 11.0(H)  Hemoglobin 11.1 - 15.9 g/dL 16.2(H) 15.5 15.8  Hematocrit 34.0 - 46.6 % 48.1(H) 44.5 45.7  Platelets 150 - 450 x10E3/uL 204 267 201    No results found for: VD25OH  Clinical ASCVD: No  The 10-year ASCVD risk score (Arnett DK, et al., 2019) is: 11.6%   Values used to calculate the score:     Age: 66 years     Sex: Female     Is Non-Hispanic African American: No     Diabetic: Yes     Tobacco smoker: No     Systolic Blood Pressure: 789 mmHg     Is BP treated: Yes     HDL Cholesterol: 52 mg/dL     Total Cholesterol: 169 mg/dL    Other: (CHADS2VASc if Afib, PHQ9 if depression, MMRC or CAT for COPD, ACT, DEXA)  Social History   Tobacco Use  Smoking Status Former   Packs/day: 0.50   Years: 35.00   Pack years: 17.50   Types: Cigarettes   Quit date: 05/11/2019   Years since quitting: 2.1  Smokeless Tobacco Never   BP Readings from Last 3 Encounters:  06/18/21 120/76  05/28/21 132/80  05/24/21 (!) 147/84   Pulse Readings from Last 3 Encounters:  05/28/21 82  05/24/21 89  05/21/21 84   Wt Readings from Last 3 Encounters:  05/28/21 177 lb (80.3 kg)  05/24/21 178 lb (80.7 kg)  05/21/21 178 lb (80.7 kg)    Assessment: Review of patient past medical history, allergies, medications, health status, including review of consultants reports, laboratory and other  test data, was performed as part of comprehensive evaluation and provision of chronic care management services.   SDOH:  (Social Determinants of Health) assessments and interventions performed:    CCM Care Plan  Allergies  Allergen Reactions   Chantix [Varenicline] Other (See Comments)    Altered mental status-Per patient was put on allergy list by Dr Lorriane Shire bu patient staes she is not allergic to this and is currently taking it.    Divalproex Sodium Other (See Comments)    Hallucinations  Other reaction(s): Confusion   Other Anaphylaxis   Pseudoeph-Hydrocodone-Gg Nausea Only   Sulfa Antibiotics Anaphylaxis and Nausea Only   Varenicline Tartrate Other (See Comments)    Other reaction(s): Confusion   Ambien [Zolpidem] Other (See Comments)    Causes sleep walking   Clavulanic Acid Diarrhea   Codeine Nausea And Vomiting   Elemental Sulfur Other (See Comments)    Swelling of tongue   Hydrocodone Nausea And Vomiting   Macrobid [Nitrofurantoin] Nausea And Vomiting    Dizziness, nausea, vomiting   Paxil [Paroxetine Hcl] Other (See Comments)    Causes ringing in the  ears.    Amoxicillin Rash    Has patient had a PCN reaction causing immediate rash, facial/tongue/throat swelling, SOB or lightheadedness with hypotension: No Has patient had a PCN reaction causing severe rash involving mucus membranes or skin necrosis: No Has patient had a PCN reaction that required hospitalization: No Has patient had a PCN reaction occurring within the last 10 years: Yes If all of the above answers are "NO", then may proceed with Cephalosporin use.    Wilder Glade [Dapagliflozin]     RECURRENT YEAST/UTI   Metformin And Related Diarrhea   Tape Rash    Medications Reviewed Today     Reviewed by Lavera Guise, [facility] (Pharmacist) on 06/18/21 at 1641  Med List Status: <None>   Medication Order Taking? Sig Documenting Provider Last Dose Status Informant  albuterol (VENTOLIN HFA) 108 (90 Base) MCG/ACT  inhaler [id number] No Inhale 2 puffs into the lungs every 6 (six) hours as needed. Loman Brooklyn, FNP Taking Active   atorvastatin (LIPITOR) 40 MG tablet [id number] No Take 1 tablet (40 mg total) by mouth daily. Loman Brooklyn, FNP Taking Active   azelastine (ASTELIN) 0.1 % nasal spray [id number] No Place 1 spray into both nostrils 2 (two) times daily. Use in each nostril as directed Chesley Mires, MD Taking Active   benzonatate (TESSALON) 100 MG capsule [id number] No Take 200 mg by mouth 3 (three) times daily as needed. [provider] Taking Active   budesonide-formoterol (SYMBICORT) 160-4.5 MCG/ACT inhaler [id number] No Inhale 2 puffs into the lungs in the morning and at bedtime. Chesley Mires, MD Taking Active   Continuous Blood Gluc Sensor (FREESTYLE LIBRE 2 SENSOR) [place] [id number] No by Does not apply route. [provider] Taking Active            Med Note Blanca Friend, Sherian Maroon Mar 08, 2021 10:31 AM) SENT TO ADV DIABETES SUPPLY FOR APPROVAL  cyclobenzaprine (FLEXERIL) 10 MG tablet [id number] No  [provider] Taking Active   diazepam (VALIUM) 5 MG tablet [id number] No Take 5 mg by mouth in the morning and at bedtime.  [provider] Taking Active   DULoxetine (CYMBALTA) 60 MG capsule [id number] No Take 1 capsule (60 mg total) by mouth daily. Marcial Pacas, MD Taking Active   fluticasone [facility]) 50 MCG/ACT nasal spray [id number] No Place 2 sprays into both nostrils daily. Loman Brooklyn, FNP Taking Active   Discontinued 06/18/21 1641   glipiZIDE (GLUCOTROL XL) 5 MG 24 hr tablet [id number] No TAKE (1) TABLET BY MOUTH ONCE DAILY WITH EVENING MEAL. Loman Brooklyn, FNP Taking Active   glucose blood (CONTOUR NEXT TEST) test strip [id number] No Test BS 4 times daily Dx E11.9 Loman Brooklyn, FNP Taking Active   lamoTRIgine (LAMICTAL) 200 MG tablet [id number] No Take 200 mg by mouth at bedtime. For mood disorder. [provider] Taking Active   lansoprazole  (PREVACID) 30 MG capsule [id number] No Take 1 capsule (30 mg total) by mouth daily at 12 noon. Loman Brooklyn, FNP Taking Active   lisinopril (ZESTRIL) 5 MG tablet [id number] No Take 1 tablet (5 mg total) by mouth daily. Loman Brooklyn, FNP Taking Active   loratadine (CLARITIN) 10 MG tablet [id number]  Take 1 tablet (10 mg total) by mouth daily. Chesley Mires, MD  Active   meclizine (ANTIVERT) 25 MG tablet [id number] No Take 2 tablets (50 mg total) by mouth daily as needed for dizziness. Loman Brooklyn, FNP Taking Active  montelukast (SINGULAIR) 10 MG tablet [id number] No TAKE (1) TABLET BY MOUTH AT BEDTIME. Loman Brooklyn, FNP Taking Active   ondansetron (ZOFRAN-ODT) 4 MG disintegrating tablet [id number] No DISSOLVE 1 TABLET BY MOUTH EVERY 8 HOURS AS NEEDED FOR NAUSEA OR VOMITING. Loman Brooklyn, FNP Taking Active   [facility] Lancets 05L MISC [id number] No Use to test blood sugar twice daily. DX: E11.9 Loman Brooklyn, FNP Taking Active   oxybutynin (DITROPAN-XL) 10 MG 24 hr tablet [id number] No TAKE (1) TABLET BY MOUTH AT BEDTIME. Loman Brooklyn, FNP Taking Active   oxyCODONE (ROXICODONE) 15 MG immediate release tablet [id number] No Take 7.5 mg by mouth every 6 (six) hours as needed.  [provider] Taking Active Self           Med Note Rulon Abide Apr 22, 2018  3:54 PM)    pregabalin (LYRICA) 150 MG capsule [id number] No Take 1 capsule (150 mg total) by mouth 2 (two) times daily. Loman Brooklyn, FNP Taking Active   PREMARIN 0.3 MG tablet [id number] No TAKE (1) TABLET BY MOUTH ONCE DAILY. Loman Brooklyn, FNP Taking Active   Semaglutide,0.25 or 0.5MG/DOS, (OZEMPIC, 0.25 OR 0.5 MG/DOSE,) 2 MG/1.5ML SOPN [id number] Yes Inject 0.5 mg into the skin once a week. STOP TRULICITY Loman Brooklyn, FNP  Active   temazepam (RESTORIL) 15 MG capsule [id number] No Take 15 mg by mouth at bedtime. [provider] Taking Active            Med Note Redmond Baseman Mar 20, 2020  2:30 PM) 03/20/20 - prescribed but not started yet  traZODone (DESYREL) 100 MG tablet [id number] No Take 100 mg by mouth at bedtime. [provider] Taking Active   valACYclovir (VALTREX) 500 MG tablet [id number] No Take 1 tablet (500 mg total) by mouth daily as needed (shingles flare up). Gwenlyn Perking, FNP Taking Active   Med List Note Loman Brooklyn, [place] 12/04/19 1526): Not Rx'd by PCP: sertraline, valium, lamotrigine, belsomra, and oxycodone - [facility] 12/02/19.            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   Type 2 diabetes mellitus with hypoglycemia without coma, without long-term current use of insulin ([place]) 07/06/2018   Bilateral temporomandibular joint pain 01/21/2018   OSA (obstructive sleep apnea) 01/21/2018   Vertigo 01/21/2018   DDD (degenerative disc disease), cervical 07/17/2017   Degeneration of lumbar intervertebral disc 07/17/2017   OCD (obsessive compulsive disorder) 11/15/2015   Tobacco use disorder 11/15/2015   Current use of estrogen therapy 01/12/2014   PTSD (post-traumatic stress disorder) 08/03/2011   Bipolar I disorder, most recent episode mixed, severe with psychotic features ([place])     Immunization History  Administered Date(s) Administered   DTaP 02/23/1957, 03/23/1957, 04/27/1957, 10/11/1959   Fluad Quad(high Dose 65+) 03/08/2021   IPV 10/26/1957, 03/14/1961, 02/13/1962, 11/22/1965   Influenza Split 05/08/2004,  05/09/2005, 03/26/2016, 02/17/2019  Influenza,inj,Quad PF,6+ Mos 02/18/2017, 02/17/2019   Influenza,inj,quad, With Preservative 04/03/2015, 04/10/2018, 02/17/2019   Influenza-Unspecified 03/10/2020   Moderna SARS-COV2 Booster Vaccination 06/07/2020   Moderna Sars-Covid-2 Vaccination 08/19/2019, 09/23/2019, 06/07/2020   PNEUMOCOCCAL CONJUGATE-20 03/29/2021   Pneumococcal Conjugate-13 03/24/2014, 10/01/2017   Pneumococcal Polysaccharide-23 04/29/2007   Smallpox 10/11/1959   Tdap 11/20/2017   Zoster Recombinat (Shingrix) 03/29/2021   Zoster, Live 04/16/2013    Conditions to be addressed/monitored: HTN, HLD, and DMII  Care Plan : PHARMD MEDICATION MANAGEMENT  Updates made by Lavera Guise, [place] since 06/18/2021 12:00 AM     Problem: DISEASE PROGRESSION PREVENTION      Long-Range Goal: T2DM, HLD   Recent Progress: Not on track  Priority: High  Note:   Current Barriers:  Unable to independently afford treatment regimen Unable to achieve control of T2DM   Pharmacist Clinical Goal(s):  Over the next 90 days, patient will verbalize ability to afford treatment regimen achieve control of T2DM as evidenced by IMPROVED GLYCEMIC CONTROL through collaboration with PharmD and provider.   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) DISCONTINUED FARXIGA DUE TO RECURRENT UTI/YEAST INFECTIONS DESPITE CONTROLLED SUGARS STOP trulicity--patient reports it is having the opposite effects on her (gaining weight, eating more?) CONTINUE GLIPIZIDE (ONLY SHORT TERM, WILL  plan to D/C) START OZEMPIC 0.5MG SQ WEEKLY Denies personal and family history of Medullary thyroid cancer (MTC) Blood sugars have decreased <200s per patient report-- [name] will also help [place] understand patterns Current glucose readings: fasting glucose: 200s, post prandial glucose: 200-300s Patient received her Libre 2 in the mail--ADV [place] Extensive education provided on FS libre 2 CGM system; pt able to demonstrated and teachback INSTRUCTED HOW TO ORDER REFILLS via adv dm supply Telephone: [phone] Denies hypoglycemic/hyperglycemic symptoms Discussed meal planning options and Plate method for healthy eating--Patient admits to not eating wisely, she is trying to work on this Avoid sugary drinks and desserts Incorporate balanced protein, non starchy veggies, 1 serving of carbohydrate with each meal Increase water intake Increase physical activity as able Current exercise: active job  Educated on current medications, patient on pill packaging with [facility]--discussed D/C [place]  Discussed Blood pressure and cholesterol--stable, continue current medications as prescribed, patient using pill packs monthly May titrate/switch statin if LDL not at goal at f/u-->would prefer rosuvastatin vs atorva (pt less likely to have myopathy which she sometimes reports)  Patient Goals/Self-Care Activities Over the next 90 days, patient will:  - take medications as prescribed check glucose daily (fasting) or if symptomatic, document, and provide at future appointments  Follow Up Plan: Telephone follow up appointment with care management team member scheduled for: 1 month      Medication Assistance: None required.  Patient affirms current coverage meets needs.  Patient's preferred pharmacy is:  [facility] [place] [address] [place] [address] Phone:  [phone] Fax: [phone]  Uses pill box? Yes; PILL PACKS FROM [place] (DISPILL) Pt endorses 100% compliance  Follow Up:  Patient agrees to Care Plan and Follow-up.  Plan: Telephone follow up appointment with care management team member scheduled for:  3 WEEKS   Regina Eck, PharmD, BCPS Clinical Pharmacist, [place]  II Phone [phone]

## 2021-06-18 ENCOUNTER — Telehealth: Payer: Self-pay | Admitting: Neurology

## 2021-06-18 DIAGNOSIS — R269 Unspecified abnormalities of gait and mobility: Secondary | ICD-10-CM

## 2021-06-18 NOTE — Patient Instructions (Addendum)
Visit Information  Thank you for taking time to visit with me today. Please don't hesitate to contact me if I can be of assistance to you before our next scheduled telephone appointment.  Following are the goals we discussed today:  Current Barriers:  Unable to independently afford treatment regimen Unable to achieve control of T2DM   Pharmacist Clinical Goal(s):  Over the next 90 days, patient will verbalize ability to afford treatment regimen achieve control of T2DM as evidenced by IMPROVED GLYCEMIC CONTROL through collaboration with PharmD and provider.   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) DISCONTINUED FARXIGA DUE TO RECURRENT UTI/YEAST INFECTIONS DESPITE CONTROLLED SUGARS STOP trulicity--patient reports it is having the opposite effects on her (gaining weight, eating more?) CONTINUE GLIPIZIDE (ONLY SHORT TERM, WILL plan to D/C) START OZEMPIC 0.5MG  SQ WEEKLY Denies personal and family history of Medullary thyroid cancer (MTC) Blood sugars have decreased <200s per patient report-- [name] will also help [place] understand patterns Current glucose readings: fasting glucose: 200s, post prandial glucose: 200-300s Patient received her Libre 2 in the mail--ADV [place] Extensive education provided on FS libre 2 CGM system; pt able to demonstrated and teachback INSTRUCTED HOW TO ORDER REFILLS via adv dm supply Telephone: [phone] Denies hypoglycemic/hyperglycemic symptoms Discussed meal planning options and Plate method for  healthy eating--Patient admits to not eating wisely, she is trying to work on this Avoid sugary drinks and desserts Incorporate balanced protein, non starchy veggies, 1 serving of carbohydrate with each meal Increase water intake Increase physical activity as able Current exercise: active job  Educated on current medications, patient on pill packaging with [facility]--discussed D/C [place]  Discussed Blood pressure and cholesterol--stable, continue current medications as prescribed, patient using pill packs monthly May titrate/switch statin if LDL not at goal at f/u-->would prefer rosuvastatin vs atorva (pt less likely to have myopathy which she sometimes reports)  Patient Goals/Self-Care Activities Over the next 90 days, patient will:  - take medications as prescribed check glucose daily (fasting) or if symptomatic, document, and provide at future appointments  Follow Up Plan: Telephone follow up appointment with care management team member scheduled for: 1 month    Please call the care guide team at [phone] if you need to cancel or reschedule your appointment.   If you are experiencing a Mental Health or [place] or need someone to talk to, please call the Suicide and Crisis Lifeline: 988 call the [place] National Suicide Prevention Lifeline: (709)268-3354 or TTY: 906-809-1098 TTY 718-673-1629) to talk to a trained counselor   The patient verbalized understanding of instructions, educational materials, and care plan provided today and declined offer to receive copy of patient instructions, educational materials, and care plan.   Signature Regina Eck, PharmD, BCPS Clinical Pharmacist, [place]  II Phone [phone]

## 2021-06-18 NOTE — Telephone Encounter (Signed)
Please call patient, MRI of cervical spine showed mild degenerative changes, there is no evidence of spinal cord or nerve root compression  Mild degenerative changes, without spinal canal stenosis or cord signal abnormality. Mild neural foraminal narrowing at C5-C6, which has progressed from the prior exam.

## 2021-06-19 ENCOUNTER — Other Ambulatory Visit: Payer: Self-pay | Admitting: Family Medicine

## 2021-06-19 DIAGNOSIS — Z8619 Personal history of other infectious and parasitic diseases: Secondary | ICD-10-CM

## 2021-06-19 NOTE — Telephone Encounter (Signed)
Pt requested next step, please advise.    Pt verified by name and DOB,  results given per provider, pt voiced understanding all question answered.

## 2021-06-19 NOTE — Addendum Note (Signed)
Addended by: Marcial Pacas on: 06/19/2021 02:41 PM   Modules accepted: Orders

## 2021-06-19 NOTE — Telephone Encounter (Signed)
Left Detailed VM

## 2021-06-19 NOTE — Telephone Encounter (Signed)
Orders Placed This Encounter  Procedures   MR THORACIC SPINE WO CONTRAST      I ordered MRI thoracic to complete evaluation, Keep her follow up visit

## 2021-06-20 ENCOUNTER — Telehealth: Payer: Self-pay | Admitting: Neurology

## 2021-06-20 ENCOUNTER — Ambulatory Visit
Admission: RE | Admit: 2021-06-20 | Discharge: 2021-06-20 | Disposition: A | Discharge status: Home / Self Care | Payer: Medicare Other | Source: Ambulatory Visit | Attending: Family Medicine | Admitting: Family Medicine

## 2021-06-20 DIAGNOSIS — Z1231 Encounter for screening mammogram for malignant neoplasm of breast: Secondary | ICD-10-CM

## 2021-06-20 NOTE — Telephone Encounter (Signed)
[employer] medicare no auth req sent to be scheduled at [facility]

## 2021-06-22 DIAGNOSIS — E1165 Type 2 diabetes mellitus with hyperglycemia: Secondary | ICD-10-CM | POA: Diagnosis not present

## 2021-06-26 ENCOUNTER — Encounter: Payer: Medicare Other | Admitting: Family Medicine

## 2021-06-29 ENCOUNTER — Other Ambulatory Visit: Payer: Self-pay | Admitting: Family Medicine

## 2021-06-29 DIAGNOSIS — Z79899 Other long term (current) drug therapy: Secondary | ICD-10-CM

## 2021-06-29 DIAGNOSIS — N952 Postmenopausal atrophic vaginitis: Secondary | ICD-10-CM

## 2021-07-05 ENCOUNTER — Ambulatory Visit: Payer: Medicare Other | Admitting: Pharmacist

## 2021-07-06 ENCOUNTER — Other Ambulatory Visit: Payer: Self-pay | Admitting: Family Medicine

## 2021-07-06 DIAGNOSIS — Z8619 Personal history of other infectious and parasitic diseases: Secondary | ICD-10-CM

## 2021-07-09 ENCOUNTER — Encounter: Payer: Medicare Other | Admitting: Pulmonary Disease

## 2021-07-10 DIAGNOSIS — I10 Essential (primary) hypertension: Secondary | ICD-10-CM

## 2021-07-10 DIAGNOSIS — E11649 Type 2 diabetes mellitus with hypoglycemia without coma: Secondary | ICD-10-CM

## 2021-07-13 ENCOUNTER — Ambulatory Visit (INDEPENDENT_AMBULATORY_CARE_PROVIDER_SITE_OTHER): Payer: Medicare Other | Admitting: Pharmacist

## 2021-07-13 DIAGNOSIS — I10 Essential (primary) hypertension: Secondary | ICD-10-CM

## 2021-07-13 DIAGNOSIS — E11649 Type 2 diabetes mellitus with hypoglycemia without coma: Secondary | ICD-10-CM

## 2021-07-13 MED ORDER — GLIPIZIDE ER 5 MG PO TB24: 5.0000 mg | ORAL_TABLET | Freq: Two times a day (BID) | ORAL | 5 refills | Status: DC

## 2021-07-15 NOTE — Progress Notes (Signed)
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) DISCONTINUED FARXIGA DUE TO RECURRENT UTI/YEAST INFECTIONS DESPITE CONTROLLED SUGARS TRULICITY WAS RESTARTED DUE TO EASE OF INJECTION FOR PATIENT CONTINUE 1.5MG SQ WEEKLY Denies personal and family history of Medullary thyroid cancer (MTC) CONSIDER INCREASING TRLUICITY, BUT CAUTIOUS GIVEN PATIENT'S EXPERINCE WITH GLP1/GI ADVERSE EVENTS D/C'D OZEMPIC PATIENT HAS BEEN CONTINUING TO TAKE JANUVIA DESPITE COUNSELING TO STOP WILL ADDRESS AT PCP VISIT NEXT WEEK SHE DENIES ADDITIONAL SIDE EFFECTS AND REPORTS JANUVIA IS HELPING HER CONTINUE GLIPIZIDE  INCREASE TO 5MG TWICE DAILY FOR NOW MAY HAVE TO ADD INSULIN Current glucose readings: fasting glucose: 200s, post prandial glucose: 200-300s Patient received her [place] 2 in the [place] Extensive education provided on FS libre 2 CGM system; pt able to demonstrated and teachback INSTRUCTED HOW TO ORDER REFILLS via adv dm supply Telephone: [phone] Denies hypoglycemic/hyperglycemic symptoms Discussed meal planning options and Plate method for healthy eating--Patient admits to not eating wisely, she is trying to work on this Avoid sugary drinks and desserts Incorporate balanced protein, non starchy veggies, 1 serving of carbohydrate with each meal Increase water intake Increase physical activity as able Current exercise: active job  Educated on current medications, patient on pill packaging with [place], INCREASE GLIPIZIDE, MAY HAVE TO ADD INSULIN, LIBRE  Discussed Blood pressure and cholesterol--stable,  continue current medications as prescribed, patient using pill packs monthly May titrate/switch statin if LDL not at goal at f/u-->would prefer rosuvastatin vs atorva (pt less likely to have myopathy which she sometimes reports)  Patient Goals/Self-Care Activities Over the next 90 days, patient will:  - take medications as prescribed check glucose daily (fasting) or if symptomatic, document, and provide at future appointments  Follow Up Plan: Telephone follow up appointment with care management team member scheduled for: 1 month  Subjective: Cynthia Deleon is an 66 y.o. year old female who is a primary patient of Loman Brooklyn, [place].  The CCM team was consulted for assistance with disease management and care coordination needs.    Engaged with patient by telephone for follow up visit in response to provider referral for pharmacy case management and/or care coordination services.   Consent to Services:  The patient was given information about Chronic Care Management services, agreed to services, and gave verbal consent prior to initiation of services.  Please see initial visit note for detailed documentation.   Patient Care Team: Loman Brooklyn, FNP as PCP - General (Family Medicine) Suella Broad, MD as Consulting Physician (Physical Medicine and Rehabilitation) Norma Fredrickson, MD as Consulting Physician (Psychiatry) Ilean China, RN as Registered Nurse Phillips Odor, MD as Consulting Physician (Neurology) Lavera Guise, [facility] (Pharmacist) Warren Danes, PA-C as Physician Assistant (Dermatology) Madelin Headings, DO (Optometry) Objective:  Lab Results  Component Value Date   CREATININE 0.79 03/29/2021   CREATININE 0.84 10/10/2020   CREATININE 0.72 06/08/2020    Lab Results  Component Value Date   HGBA1C 7.4 (H) 03/08/2021   Last diabetic Eye exam:  Lab Results  Component Value Date/Time   HMDIABEYEEXA No Retinopathy 07/25/2020 12:00 AM    Last diabetic Foot  exam: No results found for: HMDIABFOOTEX  Component Value Date/Time   CHOL 169 03/29/2021 1237   TRIG 118 03/29/2021 1237   HDL 52 03/29/2021 1237   CHOLHDL 3.3 03/29/2021 1237   CHOLHDL 4.1 11/16/2015 0715   VLDL 48 (H) 11/16/2015 0715   LDLCALC 96 03/29/2021 1237    Hepatic Function Latest Ref Rng & Units 03/29/2021 10/10/2020 03/07/2020  Total Protein 6.0 - 8.5 g/dL 6.4 6.7 6.3  Albumin 3.8 - 4.8 g/dL 4.6 5.1(H) 4.6  AST 0 - 40 IU/L '20 18 11  ' ALT 0 - 32 IU/L '13 13 7  ' Alk Phosphatase 44 - 121 IU/L 66 55 68  Total Bilirubin 0.0 - 1.2 mg/dL 0.6 0.7 0.4  Bilirubin, Direct 0.1 - 0.5 mg/dL - - -    Lab Results  Component Value Date/Time   TSH 1.220 07/04/2020 09:58 AM   TSH 1.060 03/07/2020 08:52 AM    CBC Latest Ref Rng & Units 03/29/2021 10/10/2020 06/08/2020  WBC 3.4 - 10.8 x10E3/uL 8.2 6.5 11.0(H)  Hemoglobin 11.1 - 15.9 g/dL 16.2(H) 15.5 15.8  Hematocrit 34.0 - 46.6 % 48.1(H) 44.5 45.7  Platelets 150 - 450 x10E3/uL 204 267 201    No results found for: VD25OH  Clinical ASCVD: No  The 10-year ASCVD risk score (Arnett DK, et al., 2019) is: 11.6%   Values used to calculate the score:     Age: 66 years     Sex: Female     Is Non-Hispanic African American: No     Diabetic: Yes     Tobacco smoker: No     Systolic Blood Pressure: 321 mmHg     Is BP treated: Yes     HDL Cholesterol: 52 mg/dL     Total Cholesterol: 169 mg/dL    Other: (CHADS2VASc if Afib, PHQ9 if depression, MMRC or CAT for COPD, ACT, DEXA)  Social History   Tobacco Use  Smoking Status Former   Packs/day: 0.50   Years: 35.00   Pack years: 17.50   Types: Cigarettes   Quit date: 05/11/2019   Years since quitting: 2.1  Smokeless Tobacco Never   BP Readings from Last 3 Encounters:  06/18/21 120/76  05/28/21 132/80  05/24/21 (!) 147/84   Pulse Readings from Last 3 Encounters:  05/28/21 82  05/24/21 89  05/21/21 84   Wt Readings from Last 3 Encounters:  05/28/21 177 lb (80.3 kg)  05/24/21  178 lb (80.7 kg)  05/21/21 178 lb (80.7 kg)    Assessment: Review of patient past medical history, allergies, medications, health status, including review of consultants reports, laboratory and other test data, was performed as part of comprehensive evaluation and provision of chronic care management services.   SDOH:  (Social Determinants of Health) assessments and interventions performed:    CCM Care Plan  Allergies  Allergen Reactions   Chantix [Varenicline] Other (See Comments)    Altered mental status-Per patient was put on allergy list by Dr Lorriane Shire bu patient staes she is not allergic to this and is currently taking it.    Divalproex Sodium Other (See Comments)    Hallucinations  Other reaction(s): Confusion   Other Anaphylaxis   Pseudoeph-Hydrocodone-Gg Nausea Only   Sulfa Antibiotics Anaphylaxis and Nausea Only   Varenicline Tartrate Other (See Comments)    Other reaction(s): Confusion   Ambien [Zolpidem] Other (See Comments)    Causes sleep walking   Clavulanic Acid Diarrhea   Codeine Nausea And Vomiting   Elemental Sulfur Other (See Comments)    Swelling of  tongue   Hydrocodone Nausea And Vomiting   Macrobid [Nitrofurantoin] Nausea And Vomiting    Dizziness, nausea, vomiting   Paxil [Paroxetine Hcl] Other (See Comments)    Causes ringing in the ears.    Amoxicillin Rash    Has patient had a PCN reaction causing immediate rash, facial/tongue/throat swelling, SOB or lightheadedness with hypotension: No Has patient had a PCN reaction causing severe rash involving mucus membranes or skin necrosis: No Has patient had a PCN reaction that required hospitalization: No Has patient had a PCN reaction occurring within the last 10 years: Yes If all of the above answers are "NO", then may proceed with Cephalosporin use.    Wilder Glade [Dapagliflozin]     RECURRENT YEAST/UTI   Metformin And Related Diarrhea   Tape Rash    Medications Reviewed Today     Reviewed by Lavera Guise, [facility] (Pharmacist) on 06/18/21 at 1641  Med List Status: <None>   Medication Order Taking? Sig Documenting Provider Last Dose Status Informant  albuterol (VENTOLIN HFA) 108 (90 Base) MCG/ACT inhaler [id number] No Inhale 2 puffs into the lungs every 6 (six) hours as needed. Loman Brooklyn, FNP Taking Active   atorvastatin (LIPITOR) 40 MG tablet [id number] No Take 1 tablet (40 mg total) by mouth daily. Loman Brooklyn, FNP Taking Active   azelastine (ASTELIN) 0.1 % nasal spray [id number] No Place 1 spray into both nostrils 2 (two) times daily. Use in each nostril as directed Chesley Mires, MD Taking Active   benzonatate (TESSALON) 100 MG capsule [id number] No Take 200 mg by mouth 3 (three) times daily as needed. [provider] Taking Active   budesonide-formoterol (SYMBICORT) 160-4.5 MCG/ACT inhaler [id number] No Inhale 2 puffs into the lungs in the morning and at bedtime. Chesley Mires, MD Taking Active   Continuous Blood Gluc Sensor (FREESTYLE LIBRE 2 SENSOR) [place] [id number] No by Does not apply route. [provider] Taking Active            Med Note Blanca Friend, Sherian Maroon Mar 08, 2021 10:31 AM) SENT TO ADV DIABETES SUPPLY FOR APPROVAL  cyclobenzaprine (FLEXERIL) 10 MG tablet [id number] No  [provider] Taking Active   diazepam (VALIUM) 5 MG tablet [id number] No Take 5 mg by mouth in the morning and at bedtime.  [provider] Taking Active   DULoxetine (CYMBALTA) 60 MG capsule [id number] No Take 1 capsule (60 mg total) by mouth daily. Marcial Pacas, MD Taking Active   fluticasone [facility]) 50 MCG/ACT nasal spray [id number] No Place 2 sprays into both nostrils daily. Loman Brooklyn, FNP Taking Active   Discontinued 06/18/21 1641   glipiZIDE (GLUCOTROL XL) 5 MG 24 hr tablet [id number] No TAKE (1) TABLET BY MOUTH ONCE DAILY WITH EVENING MEAL. Loman Brooklyn, FNP Taking Active   glucose blood (CONTOUR NEXT TEST) test strip [id number] No Test BS 4 times daily Dx  E11.9 Loman Brooklyn, FNP Taking Active   lamoTRIgine (LAMICTAL) 200 MG tablet [id number] No Take 200 mg by mouth at bedtime. For mood disorder. [provider] Taking Active   lansoprazole (PREVACID) 30 MG capsule [id number] No Take 1 capsule (30 mg total) by mouth daily at 12 noon. Loman Brooklyn, FNP Taking Active   lisinopril (ZESTRIL) 5 MG tablet [id number] No Take 1 tablet (5 mg total) by mouth daily. Loman Brooklyn, FNP Taking Active   loratadine (CLARITIN) 10 MG tablet [id number]  Take 1 tablet (10 mg total) by mouth daily.  Chesley Mires, MD  Active   meclizine (ANTIVERT) 25 MG tablet [id number] No Take 2 tablets (50 mg total) by mouth daily as needed for dizziness. Loman Brooklyn, FNP Taking Active   montelukast (SINGULAIR) 10 MG tablet [id number] No TAKE (1) TABLET BY MOUTH AT BEDTIME. Loman Brooklyn, FNP Taking Active   ondansetron (ZOFRAN-ODT) 4 MG disintegrating tablet [id number] No DISSOLVE 1 TABLET BY MOUTH EVERY 8 HOURS AS NEEDED FOR NAUSEA OR VOMITING. Loman Brooklyn, FNP Taking Active   [employer] Lancets 08M MISC [id number] No Use to test blood sugar twice daily. DX: E11.9 Loman Brooklyn, FNP Taking Active   oxybutynin (DITROPAN-XL) 10 MG 24 hr tablet [id number] No TAKE (1) TABLET BY MOUTH AT BEDTIME. Loman Brooklyn, FNP Taking Active   oxyCODONE (ROXICODONE) 15 MG immediate release tablet [id number] No Take 7.5 mg by mouth every 6 (six) hours as needed.  [provider] Taking Active Self           Med Note Rulon Abide Apr 22, 2018  3:54 PM)    pregabalin (LYRICA) 150 MG capsule [id number] No Take 1 capsule (150 mg total) by mouth 2 (two) times daily. Loman Brooklyn, FNP Taking Active   PREMARIN 0.3 MG tablet [id number] No TAKE (1) TABLET BY MOUTH ONCE DAILY. Loman Brooklyn, FNP Taking Active   Semaglutide,0.25 or 0.5MG/DOS, (OZEMPIC, 0.25 OR 0.5 MG/DOSE,) 2 MG/1.5ML SOPN [id number] Yes Inject 0.5 mg into the skin once a week. STOP TRULICITY  Loman Brooklyn, FNP  Active   temazepam (RESTORIL) 15 MG capsule [id number] No Take 15 mg by mouth at bedtime. [provider] Taking Active            Med Note Redmond Baseman Mar 20, 2020  2:30 PM) 03/20/20 - prescribed but not started yet  traZODone (DESYREL) 100 MG tablet [id number] No Take 100 mg by mouth at bedtime. [provider] Taking Active   valACYclovir (VALTREX) 500 MG tablet [id number] No Take 1 tablet (500 mg total) by mouth daily as needed (shingles flare up). Gwenlyn Perking, FNP Taking Active   Med List Note Loman Brooklyn, [place] 12/04/19 1526): Not Rx'd by PCP: sertraline, valium, lamotrigine, belsomra, and oxycodone - [facility] 12/02/19.            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   Type 2 diabetes mellitus with hypoglycemia without coma, without long-term current use of insulin ([place]) 07/06/2018   Bilateral temporomandibular joint pain 01/21/2018   OSA (obstructive sleep apnea) 01/21/2018   Vertigo 01/21/2018   DDD (degenerative disc disease), cervical 07/17/2017   Degeneration of lumbar intervertebral disc 07/17/2017   OCD (obsessive compulsive disorder) 11/15/2015   Tobacco use disorder 11/15/2015   Current use of estrogen therapy 01/12/2014   PTSD (post-traumatic stress disorder) 08/03/2011   Bipolar I disorder, most recent episode mixed, severe with psychotic features ([place])     Immunization History  Administered  Date(s) Administered   DTaP 02/23/1957, 03/23/1957, 04/27/1957, 10/11/1959   Fluad Quad(high Dose 65+) 03/08/2021   IPV 10/26/1957, 03/14/1961, 02/13/1962, 11/22/1965   Influenza Split 05/08/2004, 05/09/2005, 03/26/2016, 02/17/2019   Influenza,inj,Quad PF,6+ Mos 02/18/2017, 02/17/2019   Influenza,inj,quad, With Preservative 04/03/2015, 04/10/2018, 02/17/2019   Influenza-Unspecified 03/10/2020   Moderna SARS-COV2 Booster Vaccination 06/07/2020   Moderna Sars-Covid-2 Vaccination 08/19/2019, 09/23/2019, 06/07/2020   PNEUMOCOCCAL CONJUGATE-20 03/29/2021   Pneumococcal Conjugate-13 03/24/2014, 10/01/2017   Pneumococcal Polysaccharide-23 04/29/2007   Smallpox 10/11/1959   Tdap 11/20/2017   Zoster Recombinat (Shingrix) 03/29/2021   Zoster, Live 04/16/2013    Conditions to be addressed/monitored: HLD and DMII  Care Plan : PHARMD MEDICATION MANAGEMENT  Updates made by Lavera Guise, RPH since 07/15/2021 12:00 AM     Problem: DISEASE PROGRESSION PREVENTION      Long-Range Goal: T2DM, HLD   Recent Progress: Not on track  Priority: High  Note:   Current Barriers:  Unable to independently afford treatment regimen Unable to achieve control of T2DM   Pharmacist Clinical Goal(s):  Over the next 90 days, patient will verbalize ability to afford treatment regimen achieve control of T2DM as evidenced by IMPROVED GLYCEMIC CONTROL through collaboration with PharmD and provider.   Interventions: 1:1 collaboration with Loman Brooklyn, FNP regarding development and update of comprehensive plan of care as evidenced by provider attestation and co-signature Inter-disciplinary care team collaboration (see longitudinal plan of care) Comprehensive medication review performed; medication list updated in electronic medical record  Diabetes: Uncontrolled-A1C 7.4%-BLOOD SUGARS ARE 200-300 PER PATIENT; current treatment: GLIPIZIDE, TRULICITY 1.7RN SQ WEEKLY TODAY; JANUVIA INTOLERANCES METFORMIN (DUE  TO GI) DISCONTINUED FARXIGA DUE TO RECURRENT UTI/YEAST INFECTIONS DESPITE CONTROLLED SUGARS TRULICITY WAS RESTARTED DUE TO EASE OF INJECTION FOR PATIENT CONTINUE 1.5MG SQ WEEKLY Denies personal and family history of Medullary thyroid cancer ([place]) CONSIDER INCREASING TRLUICITY, BUT CAUTIOUS GIVEN PATIENT'S EXPERINCE WITH GLP1/GI ADVERSE EVENTS D/C'D [place] PATIENT HAS BEEN CONTINUING TO [place] TO STOP WILL ADDRESS AT PCP VISIT NEXT WEEK SHE DENIES ADDITIONAL SIDE EFFECTS AND REPORTS JANUVIA IS HELPING HER CONTINUE GLIPIZIDE  INCREASE TO 5MG TWICE DAILY FOR NOW MAY HAVE TO ADD INSULIN Current glucose readings: fasting glucose: 200s, post prandial glucose: 200-300s Patient received her Libre 2 in the [place] Extensive education provided on FS libre 2 CGM system; pt able to demonstrated and teachback INSTRUCTED HOW TO ORDER REFILLS via adv dm supply Telephone: [phone] Denies hypoglycemic/hyperglycemic symptoms Discussed meal planning options and Plate method for healthy eating--Patient admits to not eating wisely, she is trying to work on this Avoid sugary drinks and desserts Incorporate balanced protein, non starchy veggies, 1 serving of carbohydrate with each meal Increase water intake Increase physical activity as able Current exercise: active job  Educated on current medications, patient on pill packaging with [place], INCREASE GLIPIZIDE, MAY HAVE TO ADD INSULIN, LIBRE  Discussed Blood pressure and cholesterol--stable, continue current medications as prescribed, patient using pill packs monthly May titrate/switch statin if LDL not at goal at f/u-->would prefer rosuvastatin vs atorva (pt less likely to have myopathy which she sometimes reports)  Patient Goals/Self-Care Activities Over the next 90 days, patient will:  - take medications as prescribed check glucose daily  (fasting) or if symptomatic, document, and provide at future appointments  Follow Up Plan: Telephone follow up appointment with care management team member scheduled for: 1 month      Medication Assistance: None required.  Patient affirms current coverage meets needs.  Patient's preferred pharmacy is:  [place], [place] [address] [place] [address] Phone: [phone] Fax: [phone]  Uses pill box? Yes-DISPILL SYSTEM FROM [facility] Pt endorses 100% compliance  Follow Up:  Patient agrees to Care Plan and Follow-up.  Plan: Telephone follow up appointment with care management team member scheduled for:  1 MONTH   Regina Eck, PharmD, BCPS Clinical Pharmacist, [place]  II Phone [phone]

## 2021-07-15 NOTE — Patient Instructions (Signed)
Visit Information  Following are the goals we discussed today:  Current Barriers:  Unable to independently afford treatment regimen Unable to achieve control of T2DM   Pharmacist Clinical Goal(s):  Over the next 90 days, patient will verbalize ability to afford treatment regimen achieve control of T2DM as evidenced by IMPROVED GLYCEMIC CONTROL through collaboration with PharmD and provider.   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) DISCONTINUED FARXIGA DUE TO RECURRENT UTI/YEAST INFECTIONS DESPITE CONTROLLED SUGARS TRULICITY WAS RESTARTED DUE TO EASE OF INJECTION FOR PATIENT CONTINUE 1.5MG  SQ WEEKLY Denies personal and family history of Medullary thyroid cancer ([place]) CONSIDER INCREASING TRLUICITY, BUT CAUTIOUS GIVEN PATIENT'S EXPERINCE WITH GLP1/GI ADVERSE EVENTS D/C'D [place] PATIENT HAS BEEN CONTINUING TO [place] TO STOP WILL ADDRESS AT PCP VISIT NEXT WEEK SHE DENIES ADDITIONAL SIDE EFFECTS AND REPORTS JANUVIA IS HELPING HER CONTINUE GLIPIZIDE  INCREASE TO 5MG  TWICE DAILY FOR NOW MAY HAVE TO ADD INSULIN Current glucose readings: fasting glucose: 200s, post prandial glucose: 200-300s Patient received her Libre 2 in the [place] Extensive education provided on FS libre 2 CGM system; pt able to demonstrated and teachback INSTRUCTED HOW TO ORDER REFILLS via adv dm supply Telephone: [phone] Denies hypoglycemic/hyperglycemic  symptoms Discussed meal planning options and Plate method for healthy eating--Patient admits to not eating wisely, she is trying to work on this Avoid sugary drinks and desserts Incorporate balanced protein, non starchy veggies, 1 serving of carbohydrate with each meal Increase water intake Increase physical activity as able Current exercise: active job  Educated on current medications, patient on pill packaging with [place], INCREASE GLIPIZIDE, MAY HAVE TO ADD INSULIN, LIBRE  Discussed Blood pressure and cholesterol--stable, continue current medications as prescribed, patient using pill packs monthly May titrate/switch statin if LDL not at goal at f/u-->would prefer rosuvastatin vs atorva (pt less likely to have myopathy which she sometimes reports)  Patient Goals/Self-Care Activities Over the next 90 days, patient will:  - take medications as prescribed check glucose daily (fasting) or if symptomatic, document, and provide at future appointments  Follow Up Plan: Telephone follow up appointment with care management team member scheduled for: 1 month   Plan: Telephone follow up appointment with care management team member scheduled for:  1 MONTH  Signature Regina Eck, PharmD, BCPS Clinical Pharmacist, [place]  II Phone [phone]   Please call the care guide team at [phone] if you need to cancel or reschedule your appointment.   Patient verbalizes understanding of instructions and care plan provided today and agrees to view in [place]. Active MyChart status confirmed with patient.

## 2021-07-16 ENCOUNTER — Encounter (HOSPITAL_COMMUNITY): Payer: Self-pay

## 2021-07-16 NOTE — Progress Notes (Signed)
[facility] referral closed due to numerous no call no shows.

## 2021-07-19 ENCOUNTER — Telehealth: Payer: Self-pay | Admitting: Family Medicine

## 2021-07-19 ENCOUNTER — Ambulatory Visit: Payer: Medicare Other | Admitting: Family Medicine

## 2021-07-19 DIAGNOSIS — M5412 Radiculopathy, cervical region: Secondary | ICD-10-CM | POA: Diagnosis not present

## 2021-07-19 NOTE — Telephone Encounter (Signed)
Do you know what patient is referring to ?

## 2021-07-20 ENCOUNTER — Encounter: Payer: Self-pay | Admitting: Family Medicine

## 2021-07-20 ENCOUNTER — Ambulatory Visit (INDEPENDENT_AMBULATORY_CARE_PROVIDER_SITE_OTHER): Payer: Medicare Other | Admitting: Family Medicine

## 2021-07-20 DIAGNOSIS — J441 Chronic obstructive pulmonary disease with (acute) exacerbation: Secondary | ICD-10-CM

## 2021-07-20 MED ORDER — METHYLPREDNISOLONE 4 MG PO TBPK: ORAL_TABLET | ORAL | 0 refills | Status: DC

## 2021-07-20 MED ORDER — ALBUTEROL SULFATE HFA 108 (90 BASE) MCG/ACT IN AERS: 2.0000 | INHALATION_SPRAY | Freq: Four times a day (QID) | RESPIRATORY_TRACT | 2 refills | Status: DC | PRN

## 2021-07-20 MED ORDER — AZITHROMYCIN 250 MG PO TABS: ORAL_TABLET | ORAL | 0 refills | Status: DC

## 2021-07-20 NOTE — Progress Notes (Signed)
Virtual Visit via Telephone Note  I connected with Cynthia Deleon on 07/20/21 at 10:01 AM by telephone and verified that I am speaking with the correct person using two identifiers. Cynthia Deleon is currently located at home and nobody is currently with her during this visit. The provider, Loman Brooklyn, FNP is located in their office at time of visit.  I discussed the limitations, risks, security and privacy concerns of performing an evaluation and management service by telephone and the availability of in person appointments. I also discussed with the patient that there may be a patient responsible charge related to this service. The patient expressed understanding and agreed to proceed.  Subjective: PCP: Loman Brooklyn, FNP  Chief Complaint  Patient presents with   URI   Patient complains of a nonproductive cough, head/chest congestion, sore throat, postnasal drainage, shortness of breath, wheezing, diarrhea, loss of taste, loss of smell, and fatigue .  Wheezing and shortness of breath are worse than usual. Onset of symptoms was 2 days ago, unchanged since that time. She is drinking plenty of fluids. Evaluation to date: at home COVID test negative. Treatment to date: cough suppressants, decongestants, and Albuterol . She has a history of COPD. She does not smoke.    ROS: Per HPI  Current Outpatient Medications:    albuterol (VENTOLIN HFA) 108 (90 Base) MCG/ACT inhaler, Inhale 2 puffs into the lungs every 6 (six) hours as needed., Disp: 18 g, Rfl: 2   atorvastatin (LIPITOR) 40 MG tablet, Take 1 tablet (40 mg total) by mouth daily., Disp: 90 tablet, Rfl: 1   azelastine (ASTELIN) 0.1 % nasal spray, Place 1 spray into both nostrils 2 (two) times daily. Use in each nostril as directed, Disp: 30 mL, Rfl: 12   benzonatate (TESSALON) 100 MG capsule, Take 200 mg by mouth 3 (three) times daily as needed., Disp: , Rfl:    budesonide-formoterol (SYMBICORT) 160-4.5 MCG/ACT inhaler, Inhale  2 puffs into the lungs in the morning and at bedtime., Disp: 1 each, Rfl: 6   Continuous Blood Gluc Sensor (FREESTYLE LIBRE 2 SENSOR) MISC, by Does not apply route., Disp: , Rfl:    cyclobenzaprine (FLEXERIL) 10 MG tablet, , Disp: , Rfl:    diazepam (VALIUM) 5 MG tablet, Take 5 mg by mouth in the morning and at bedtime. , Disp: , Rfl:    DULoxetine (CYMBALTA) 60 MG capsule, Take 1 capsule (60 mg total) by mouth daily., Disp: 30 capsule, Rfl: 11   fluticasone (FLONASE) 50 MCG/ACT nasal spray, Place 2 sprays into both nostrils daily., Disp: 16 g, Rfl: 5   glipiZIDE (GLUCOTROL XL) 5 MG 24 hr tablet, Take 1 tablet (5 mg total) by mouth 2 (two) times daily with a meal., Disp: 60 tablet, Rfl: 5   glucose blood (CONTOUR NEXT TEST) test strip, Test BS 4 times daily Dx E11.9, Disp: 400 strip, Rfl: 3   JANUVIA 100 MG tablet, Take 100 mg by mouth daily., Disp: , Rfl:    lamoTRIgine (LAMICTAL) 200 MG tablet, Take 200 mg by mouth at bedtime. For mood disorder., Disp: , Rfl:    lansoprazole (PREVACID) 30 MG capsule, Take 1 capsule (30 mg total) by mouth daily at 12 noon., Disp: 30 capsule, Rfl: 12   lisinopril (ZESTRIL) 5 MG tablet, Take 1 tablet (5 mg total) by mouth daily., Disp: 90 tablet, Rfl: 1   loratadine (CLARITIN) 10 MG tablet, Take 1 tablet (10 mg total) by mouth daily., Disp: 30 tablet, Rfl:  11   meclizine (ANTIVERT) 25 MG tablet, Take 2 tablets (50 mg total) by mouth daily as needed for dizziness., Disp: 60 tablet, Rfl: 2   montelukast (SINGULAIR) 10 MG tablet, TAKE (1) TABLET BY MOUTH AT BEDTIME., Disp: 90 tablet, Rfl: 1   ondansetron (ZOFRAN-ODT) 4 MG disintegrating tablet, DISSOLVE 1 TABLET BY MOUTH EVERY 8 HOURS AS NEEDED FOR NAUSEA OR VOMITING., Disp: 20 tablet, Rfl: 0   OneTouch Delica Lancets 74Q MISC, Use to test blood sugar twice daily. DX: E11.9, Disp: 100 each, Rfl: 3   oxybutynin (DITROPAN-XL) 10 MG 24 hr tablet, TAKE (1) TABLET BY MOUTH AT BEDTIME., Disp: 90 tablet, Rfl: 1   oxyCODONE  (ROXICODONE) 15 MG immediate release tablet, Take 7.5 mg by mouth every 6 (six) hours as needed. , Disp: , Rfl:    pregabalin (LYRICA) 150 MG capsule, Take 1 capsule (150 mg total) by mouth 2 (two) times daily., Disp: 180 capsule, Rfl: 1   PREMARIN 0.3 MG tablet, TAKE (1) TABLET BY MOUTH ONCE DAILY., Disp: 30 tablet, Rfl: 0   temazepam (RESTORIL) 15 MG capsule, Take 15 mg by mouth at bedtime., Disp: , Rfl:    traZODone (DESYREL) 100 MG tablet, Take 100 mg by mouth at bedtime., Disp: , Rfl:    TRULICITY 1.5 VZ/5.6LO SOPN, Inject 1.5 mg into the skin once a week., Disp: , Rfl:    valACYclovir (VALTREX) 500 MG tablet, TAKE (1) TABLET BY MOUTH ONCE DAILY AS NEEDED FOR SHINGLES FLARE UP., Disp: 30 tablet, Rfl: 0  Allergies  Allergen Reactions   Chantix [Varenicline] Other (See Comments)    Altered mental status-Per patient was put on allergy list by Dr Lorriane Shire bu patient staes she is not allergic to this and is currently taking it.    Divalproex Sodium Other (See Comments)    Hallucinations  Other reaction(s): Confusion   Other Anaphylaxis   Pseudoeph-Hydrocodone-Gg Nausea Only   Sulfa Antibiotics Anaphylaxis and Nausea Only   Varenicline Tartrate Other (See Comments)    Other reaction(s): Confusion   Ambien [Zolpidem] Other (See Comments)    Causes sleep walking   Clavulanic Acid Diarrhea   Codeine Nausea And Vomiting   Elemental Sulfur Other (See Comments)    Swelling of tongue   Hydrocodone Nausea And Vomiting   Macrobid [Nitrofurantoin] Nausea And Vomiting    Dizziness, nausea, vomiting   Paxil [Paroxetine Hcl] Other (See Comments)    Causes ringing in the ears.    Amoxicillin Rash    Has patient had a PCN reaction causing immediate rash, facial/tongue/throat swelling, SOB or lightheadedness with hypotension: No Has patient had a PCN reaction causing severe rash involving mucus membranes or skin necrosis: No Has patient had a PCN reaction that required hospitalization: No Has  patient had a PCN reaction occurring within the last 10 years: Yes If all of the above answers are "NO", then may proceed with Cephalosporin use.    Farxiga [Dapagliflozin]     RECURRENT YEAST/UTI   Metformin And Related Diarrhea   Tape Rash   Past Medical History:  Diagnosis Date   Arthritis    hands and knees   Bipolar 1 disorder (HCC)    Borderline diabetic    Chronic back pain    Chronic neck pain    Complication of anesthesia    high anxiety-does not want to be alone   COPD (chronic obstructive pulmonary disease) ([place])    Current use of estrogen therapy 01/12/2014   Depression  Diabetes mellitus without complication ([place])    Diplopia    Family history of adverse reaction to anesthesia    sister "gas in lungs"   GERD (gastroesophageal reflux disease)    Headache    Hot flashes 12/15/2013   Hypertension    IBS (irritable bowel syndrome)    Incontinence    Kidney stone    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HCC)    Panic attacks    Peptic ulcer    Peripheral neuropathy    Rosacea    Shingles    Sleep apnea    uses a cpap-with oxygen    Observations/Objective: A&O  No respiratory distress or wheezing audible over the phone Mood, judgement, and thought processes all WNL  Assessment and Plan: 1. COPD with acute exacerbation ([place]) Continue symptom management. - albuterol (VENTOLIN HFA) 108 (90 Base) MCG/ACT inhaler; Inhale 2 puffs into the lungs every 6 (six) hours as needed.  Dispense: 18 g; Refill: 2 - azithromycin (ZITHROMAX Z-PAK) 250 MG tablet; Take 2 tablets (500 mg) PO today, then 1 tablet (250 mg) PO daily x4 days.  Dispense: 6 tablet; Refill: 0 - methylPREDNISolone (MEDROL DOSEPAK) 4 MG TBPK tablet; Use as directed.  Dispense: 21 each; Refill: 0   Follow Up Instructions:  I discussed the assessment and treatment plan with the patient. The patient was provided an opportunity to ask questions and all were answered. The patient agreed with the plan and  demonstrated an understanding of the instructions.   The patient was advised to call back or seek an in-person evaluation if the symptoms worsen or if the condition fails to improve as anticipated.  The above assessment and management plan was discussed with the patient. The patient verbalized understanding of and has agreed to the management plan. Patient is aware to call the clinic if symptoms persist or worsen. Patient is aware when to return to the clinic for a follow-up visit. Patient educated on when it is appropriate to go to the emergency department.   Time call ended: 10:12 AM  I provided 11 minutes of non-face-to-face time during this encounter.  [name], MSN, APRN, FNP-C [facility] 07/20/21

## 2021-07-23 DIAGNOSIS — E1165 Type 2 diabetes mellitus with hyperglycemia: Secondary | ICD-10-CM | POA: Diagnosis not present

## 2021-07-24 ENCOUNTER — Telehealth: Payer: Self-pay | Admitting: Pharmacist

## 2021-07-24 ENCOUNTER — Ambulatory Visit: Payer: Medicare Other | Admitting: Pharmacist

## 2021-07-24 DIAGNOSIS — E11649 Type 2 diabetes mellitus with hypoglycemia without coma: Secondary | ICD-10-CM

## 2021-07-24 MED ORDER — TRULICITY 3 MG/0.5ML ~~LOC~~ SOAJ: 3.0000 mg | SUBCUTANEOUS | 4 refills | Status: DC

## 2021-07-24 NOTE — Telephone Encounter (Signed)
Increase trulicity to 3mg   BG still uncontrolled

## 2021-07-25 NOTE — Telephone Encounter (Signed)
Discussed medications with patient We are increasing her trulicty to 3mg 

## 2021-07-26 LAB — HM DIABETES EYE EXAM

## 2021-07-31 ENCOUNTER — Other Ambulatory Visit: Payer: Self-pay | Admitting: Family Medicine

## 2021-07-31 DIAGNOSIS — N952 Postmenopausal atrophic vaginitis: Secondary | ICD-10-CM

## 2021-07-31 DIAGNOSIS — Z79899 Other long term (current) drug therapy: Secondary | ICD-10-CM

## 2021-08-01 DIAGNOSIS — J0100 Acute maxillary sinusitis, unspecified: Secondary | ICD-10-CM | POA: Diagnosis not present

## 2021-08-01 DIAGNOSIS — B370 Candidal stomatitis: Secondary | ICD-10-CM | POA: Diagnosis not present

## 2021-08-01 DIAGNOSIS — H6121 Impacted cerumen, right ear: Secondary | ICD-10-CM | POA: Diagnosis not present

## 2021-08-07 DIAGNOSIS — I10 Essential (primary) hypertension: Secondary | ICD-10-CM

## 2021-08-07 DIAGNOSIS — E11649 Type 2 diabetes mellitus with hypoglycemia without coma: Secondary | ICD-10-CM

## 2021-08-08 ENCOUNTER — Ambulatory Visit (HOSPITAL_COMMUNITY): Admission: RE | Admit: 2021-08-08 | Payer: Medicare Other | Source: Ambulatory Visit

## 2021-08-09 ENCOUNTER — Ambulatory Visit: Payer: Medicare Other | Admitting: Pharmacist

## 2021-08-09 DIAGNOSIS — E782 Mixed hyperlipidemia: Secondary | ICD-10-CM

## 2021-08-09 DIAGNOSIS — E11649 Type 2 diabetes mellitus with hypoglycemia without coma: Secondary | ICD-10-CM

## 2021-08-09 MED ORDER — PROMETHAZINE HCL 12.5 MG PO TABS: 12.5000 mg | ORAL_TABLET | Freq: Three times a day (TID) | ORAL | 0 refills | Status: DC | PRN

## 2021-08-09 NOTE — Progress Notes (Signed)
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) DISCONTINUED FARXIGA DUE TO RECURRENT UTI/YEAST INFECTIONS DESPITE CONTROLLED SUGARS INCREASED TRULICITY TO 3MG WEEKLY Denies personal and family history of Medullary thyroid cancer (MTC) D/C'D OZEMPIC DUE TO GI SIDE EFFECTS (TOLERATES TRULICITY) PATIENT HAS BEEN CONTINUING TO TAKE JANUVIA DESPITE COUNSELING TO STOP WILL ADDRESS AT PCP VISIT NEXT WEEK SHE DENIES ADDITIONAL SIDE EFFECTS AND REPORTS JANUVIA IS HELPING HER CONTINUE GLIPIZIDE  Current glucose readings: fasting glucose: <130, post prandial glucose: <200 MUCH IMPROVED  Patient received her Libre 2 in the [place] education provided on FS libre 2 CGM system; pt able to demonstrated and teachback INSTRUCTED HOW TO ORDER REFILLS via adv dm supply Telephone: [phone]: Cynthia SALAZARis an 66y.o. year old female who is a primary patient of JLoman Brooklyn FNP.  The CCM team was consulted for assistance with disease management and care coordination needs.    Engaged with patient face to face for follow up visit in response to provider referral for pharmacy case management and/or care coordination services.   Consent to Services:  The patient was given information about Chronic Care Management services, agreed to services, and gave verbal consent prior to initiation of services.  Please see initial visit note for detailed documentation.   Patient Care Team: JLoman Brooklyn FNP as PCP - General (Family Medicine) RSuella Broad MD as Consulting Physician (Physical Medicine and Rehabilitation) PNorma Fredrickson MD  as Consulting Physician (Psychiatry) HIlean China RN as Registered Nurse DPhillips Odor MD as Consulting Physician (Neurology) PLavera Guise [facility](Pharmacist) SWarren Danes PA-C as Physician Assistant (Dermatology) CMadelin Headings DO (Optometry)  Objective:  Lab Results  Component Value Date   CREATININE 0.79 03/29/2021   CREATININE 0.84 10/10/2020   CREATININE 0.72 06/08/2020    Lab Results  Component Value Date   HGBA1C 7.4 (H) 03/08/2021   Last diabetic Eye exam:  Lab Results  Component Value Date/Time   HMDIABEYEEXA No Retinopathy 07/26/2021 12:00 AM    Last diabetic Foot exam: No results found for: HMDIABFOOTEX      Component Value Date/Time   CHOL 169 03/29/2021 1237   TRIG 118 03/29/2021 1237   HDL 52 03/29/2021 1237   CHOLHDL 3.3 03/29/2021 1237   CHOLHDL 4.1 11/16/2015 0715   VLDL 48 (H) 11/16/2015 0715   LDLCALC 96 03/29/2021 1237    Hepatic Function Latest Ref Rng & Units 03/29/2021 10/10/2020 03/07/2020  Total Protein 6.0 - 8.5 g/dL 6.4 6.7 6.3  Albumin 3.8 - 4.8 g/dL 4.6 5.1(H) 4.6  AST 0 - 40 IU/L '20 18 11  ' ALT 0 - 32 IU/L '13 13 7  ' Alk Phosphatase 44 - 121 IU/L 66 55 68  Total Bilirubin 0.0 - 1.2 mg/dL 0.6 0.7 0.4  Bilirubin, Direct 0.1 - 0.5 mg/dL - - -    Lab Results  Component Value Date/Time   TSH 1.220 07/04/2020 09:58 AM   TSH 1.060 03/07/2020 08:52 AM    CBC Latest Ref Rng & Units 03/29/2021 10/10/2020 06/08/2020  WBC 3.4 - 10.8 x10E3/uL 8.2 6.5 11.0(H)  Hemoglobin 11.1 - 15.9 g/dL 16.2(H) 15.5 15.8  Hematocrit 34.0 - 46.6 % 48.1(H) 44.5 45.7  Platelets 150 -  450 x10E3/uL 204 267 201    No results found for: VD25OH  Clinical ASCVD: No  The 10-year ASCVD risk score (Arnett DK, et al., 2019) is: 16.5%   Values used to calculate the score:     Age: 27 years     Sex: Female     Is Non-Hispanic African American: No     Diabetic: Yes     Tobacco smoker: No     Systolic Blood Pressure: 937 mmHg     Is BP treated: Yes     HDL  Cholesterol: 52 mg/dL     Total Cholesterol: 169 mg/dL    Other: (CHADS2VASc if Afib, PHQ9 if depression, MMRC or CAT for COPD, ACT, DEXA)  Social History   Tobacco Use  Smoking Status Former   Packs/day: 0.50   Years: 35.00   Pack years: 17.50   Types: Cigarettes   Quit date: 05/11/2019   Years since quitting: 2.2  Smokeless Tobacco Never   BP Readings from Last 3 Encounters:  06/18/21 120/76  05/28/21 132/80  05/24/21 (!) 147/84   Pulse Readings from Last 3 Encounters:  05/28/21 82  05/24/21 89  05/21/21 84   Wt Readings from Last 3 Encounters:  05/28/21 177 lb (80.3 kg)  05/24/21 178 lb (80.7 kg)  05/21/21 178 lb (80.7 kg)    Assessment: Review of patient past medical history, allergies, medications, health status, including review of consultants reports, laboratory and other test data, was performed as part of comprehensive evaluation and provision of chronic care management services.   SDOH:  (Social Determinants of Health) assessments and interventions performed:    CCM Care Plan  Allergies  Allergen Reactions   Chantix [Varenicline] Other (See Comments)    Altered mental status-Per patient was put on allergy list by Dr Lorriane Shire bu patient staes she is not allergic to this and is currently taking it.    Divalproex Sodium Other (See Comments)    Hallucinations  Other reaction(s): Confusion   Other Anaphylaxis   Pseudoeph-Hydrocodone-Gg Nausea Only   Sulfa Antibiotics Anaphylaxis and Nausea Only   Varenicline Tartrate Other (See Comments)    Other reaction(s): Confusion   Ambien [Zolpidem] Other (See Comments)    Causes sleep walking   Clavulanic Acid Diarrhea   Codeine Nausea And Vomiting   Elemental Sulfur Other (See Comments)    Swelling of tongue   Hydrocodone Nausea And Vomiting   Macrobid [Nitrofurantoin] Nausea And Vomiting    Dizziness, nausea, vomiting   Paxil [Paroxetine Hcl] Other (See Comments)    Causes ringing in the ears.     Amoxicillin Rash    Has patient had a PCN reaction causing immediate rash, facial/tongue/throat swelling, SOB or lightheadedness with hypotension: No Has patient had a PCN reaction causing severe rash involving mucus membranes or skin necrosis: No Has patient had a PCN reaction that required hospitalization: No Has patient had a PCN reaction occurring within the last 10 years: Yes If all of the above answers are "NO", then may proceed with Cephalosporin use.    Wilder Glade [Dapagliflozin]     RECURRENT YEAST/UTI   Metformin And Related Diarrhea   Tape Rash    Medications Reviewed Today     Reviewed by Lavera Guise, [facility] (Pharmacist) on 08/09/21 at [place] List Status: <None>   Medication Order Taking? Sig Documenting Provider Last Dose Status Informant  albuterol (VENTOLIN HFA) 108 (90 Base) MCG/ACT inhaler [id number]  Inhale 2 puffs into the lungs every  6 (six) hours as needed. Loman Brooklyn, FNP  Active   atorvastatin (LIPITOR) 40 MG tablet [id number]  Take 1 tablet (40 mg total) by mouth daily. Loman Brooklyn, FNP  Active   azelastine (ASTELIN) 0.1 % nasal spray [id number]  Place 1 spray into both nostrils 2 (two) times daily. Use in each nostril as directed Chesley Mires, MD  Active   azithromycin (ZITHROMAX Z-PAK) 250 MG tablet [id number]  Take 2 tablets (500 mg) PO today, then 1 tablet (250 mg) PO daily x4 days. Loman Brooklyn, FNP  Active   benzonatate (TESSALON) 100 MG capsule [id number]  Take 200 mg by mouth 3 (three) times daily as needed. [provider]  Active   budesonide-formoterol [facility]) 160-4.5 MCG/ACT inhaler [id number]  Inhale 2 puffs into the lungs in the morning and at bedtime. Chesley Mires, MD  Active   Continuous Blood Gluc Sensor (FREESTYLE LIBRE 2 SENSOR) [place] [id number]  by Does not apply route. [provider]  Active            Med Note Blanca Friend, Arieal Cuoco D   Thu Mar 08, 2021 10:31 AM) SENT TO ADV DIABETES SUPPLY FOR APPROVAL  cyclobenzaprine  (FLEXERIL) 10 MG tablet [id number]   [provider]  Active   diazepam (VALIUM) 5 MG tablet [id number]  Take 5 mg by mouth in the morning and at bedtime.  [provider]  Active   Dulaglutide (TRULICITY) 3 UL/8.4TX SOPN [id number]  Inject 3 mg as directed once a week. DX: E11.65 Loman Brooklyn, FNP  Active   DULoxetine (CYMBALTA) 60 MG capsule [id number]  Take 1 capsule (60 mg total) by mouth daily. Marcial Pacas, MD  Active   fluticasone [facility]) 50 MCG/ACT nasal spray [id number]  Place 2 sprays into both nostrils daily. Loman Brooklyn, FNP  Active   glipiZIDE (GLUCOTROL XL) 5 MG 24 hr tablet [id number]  Take 1 tablet (5 mg total) by mouth 2 (two) times daily with a meal. Loman Brooklyn, FNP  Active   glucose blood (CONTOUR NEXT TEST) test strip [id number]  Test BS 4 times daily Dx E11.9 Loman Brooklyn, FNP  Active   JANUVIA 100 MG tablet [id number] Yes Take 100 mg by mouth daily. [provider] Taking Active   lamoTRIgine (LAMICTAL) 200 MG tablet [id number]  Take 200 mg by mouth at bedtime. For mood disorder. [provider]  Active   lansoprazole (PREVACID) 30 MG capsule [id number]  Take 1 capsule (30 mg total) by mouth daily at 12 noon. Loman Brooklyn, FNP  Active   lisinopril (ZESTRIL) 5 MG tablet [id number]  Take 1 tablet (5 mg total) by mouth daily. Loman Brooklyn, FNP  Active   loratadine (CLARITIN) 10 MG tablet [id number]  Take 1 tablet (10 mg total) by mouth daily. Chesley Mires, MD  Active   meclizine (ANTIVERT) 25 MG tablet [id number]  Take 2 tablets (50 mg total) by mouth daily as needed for dizziness. Loman Brooklyn, FNP  Active   methylPREDNISolone (MEDROL DOSEPAK) 4 MG TBPK tablet [id number]  Use as directed. Loman Brooklyn, FNP  Active   montelukast (SINGULAIR) 10 MG tablet [id number]  TAKE (1) TABLET BY MOUTH AT BEDTIME. Loman Brooklyn, FNP  Active   ondansetron (ZOFRAN-ODT) 4 MG disintegrating tablet [id number]  DISSOLVE 1 TABLET BY MOUTH EVERY  8 HOURS AS NEEDED FOR NAUSEA OR VOMITING. Loman Brooklyn, FNP  Active   OneTouch Delica Lancets 44Q MISC [id number]  Use  to test blood sugar twice daily. DX: E11.9 Loman Brooklyn, FNP  Active   oxybutynin (DITROPAN-XL) 10 MG 24 hr tablet [id number]  TAKE (1) TABLET BY MOUTH AT BEDTIME. Loman Brooklyn, FNP  Active   oxyCODONE (ROXICODONE) 15 MG immediate release tablet [id number]  Take 7.5 mg by mouth every 6 (six) hours as needed.  [provider]  Active Self           Med Note Rulon Abide Apr 22, 2018  3:54 PM)    pregabalin (LYRICA) 150 MG capsule [id number]  Take 1 capsule (150 mg total) by mouth 2 (two) times daily. Loman Brooklyn, FNP  Active   PREMARIN 0.3 MG tablet [id number]  TAKE (1) TABLET BY MOUTH ONCE DAILY. Loman Brooklyn, FNP  Active   promethazine (PHENERGAN) 12.5 MG tablet [id number] Yes Take 1 tablet (12.5 mg total) by mouth every 8 (eight) hours as needed for nausea or vomiting. Loman Brooklyn, FNP  Active   temazepam (RESTORIL) 15 MG capsule [id number]  Take 15 mg by mouth at bedtime. [provider]  Active            Med Note Rich Reining, Elson Clan Mar 20, 2020  2:30 PM) 03/20/20 - prescribed but not started yet  traZODone (DESYREL) 100 MG tablet [id number]  Take 100 mg by mouth at bedtime. [provider]  Active   valACYclovir (VALTREX) 500 MG tablet [id number]  TAKE (1) TABLET BY MOUTH ONCE DAILY AS NEEDED FOR SHINGLES FLARE UP. Loman Brooklyn, FNP  Active   Med List Note Loman Brooklyn, [place] 12/04/19 1526): Not Rx'd by PCP: sertraline, valium, lamotrigine, belsomra, and oxycodone - [facility] 12/02/19.            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   Type 2 diabetes mellitus with hypoglycemia without coma, without long-term current use of insulin ([place]) 07/06/2018   Bilateral temporomandibular joint pain 01/21/2018   OSA (obstructive sleep apnea) 01/21/2018   Vertigo 01/21/2018   DDD (degenerative disc disease), cervical 07/17/2017   Degeneration of lumbar intervertebral disc 07/17/2017   OCD (obsessive compulsive disorder) 11/15/2015   Tobacco use disorder 11/15/2015   Current use of estrogen therapy 01/12/2014   PTSD (post-traumatic stress disorder) 08/03/2011   Bipolar I disorder, most recent episode mixed, severe with psychotic features ([place])     Immunization History  Administered Date(s) Administered   DTaP 02/23/1957, 03/23/1957, 04/27/1957, 10/11/1959   Fluad Quad(high Dose 65+) 03/08/2021   IPV 10/26/1957, 03/14/1961, 02/13/1962, 11/22/1965   Influenza Split 05/08/2004, 05/09/2005, 03/26/2016, 02/17/2019   Influenza,inj,Quad PF,6+ Mos 02/18/2017, 02/17/2019   Influenza,inj,quad, With Preservative 04/03/2015, 04/10/2018, 02/17/2019   Influenza-Unspecified 03/10/2020   Moderna SARS-COV2 Booster Vaccination 06/07/2020   Moderna Sars-Covid-2 Vaccination 08/19/2019, 09/23/2019, 06/07/2020   PNEUMOCOCCAL CONJUGATE-20 03/29/2021   Pneumococcal Conjugate-13 03/24/2014, 10/01/2017   Pneumococcal Polysaccharide-23 04/29/2007   Smallpox 10/11/1959   Tdap 11/20/2017   Zoster Recombinat (  Shingrix) 03/29/2021   Zoster, Live 04/16/2013    Conditions to be addressed/monitored: HLD and DMII  Care Plan : PHARMD MEDICATION MANAGEMENT  Updates made by Lavera Guise, RPH since 08/09/2021 12:00 AM     Problem: DISEASE PROGRESSION PREVENTION      Long-Range Goal: T2DM, HLD   Recent  Progress: Not on track  Priority: High  Note:   Current Barriers:  Unable to independently afford treatment regimen Unable to achieve control of T2DM   Pharmacist Clinical Goal(s):  Over the next 90 days, patient will verbalize ability to afford treatment regimen achieve control of T2DM as evidenced by IMPROVED GLYCEMIC CONTROL through collaboration with PharmD and provider.   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) DISCONTINUED FARXIGA DUE TO RECURRENT UTI/YEAST INFECTIONS DESPITE CONTROLLED SUGARS INCREASED TRULICITY TO 3[place] personal and family history of Medullary thyroid cancer (MTC) D/C'D OZEMPIC DUE TO GI SIDE EFFECTS (TOLERATES TRULICITY) PATIENT HAS BEEN CONTINUING TO [place]resseyTO STOP WILL ADDRESS AT PCP VISIT NEXT WEEK SHE DENIES ADDITIONAL SIDE EFFECTS AND REPORTS JANUVIA IS HELPING HER CONTINUE GLIPIZIDE  Current glucose readings: fasting glucose: <130, post prandial glucose: <200 MUCH IMPROVED  Patient received her Libre 2 in the [place] education provided on FS libre 2 CGM system; pt able to demonstrated and teachback INSTRUCTED HOW TO ORDER REFILLS via adv dm supply Telephone: [phone] hypoglycemic/hyperglycemic symptoms Discussed meal planning options and Plate method for healthy eating--Patient admits to not eating wisely, she is trying to work on this Avoid sugary drinks and desserts Incorporate  balanced protein, non starchy veggies, 1 serving of carbohydrate with each meal Increase water intake Increase physical activity as able Current exercise: active job  Educated on current medications, patient on pill packaging with BSenoia INCREASE GLIPIZIDE, MAY HAVE TO ADD INSULIN, LIBRE  Discussed Blood pressure and cholesterol--stable, continue current medications as prescribed, patient using pill packs monthly May titrate/switch statin if LDL not at goal at f/u-->would prefer rosuvastatin vs atorva (pt less likely to have myopathy which she sometimes reports)  Patient Goals/Self-Care Activities Over the next 90 days, patient will:  - take medications as prescribed check glucose daily (fasting) or if symptomatic, document, and provide at future appointments  Follow Up Plan: Telephone follow up appointment with care management team member scheduled for: 1 month      Medication Assistance: None required.  Patient affirms current coverage meets needs.  [place]  [place] [id number]: [phone]: [phone] Follow Up:  Patient agrees to Care Plan and Follow-up.  Plan: Telephone follow up appointment with care management team member scheduled for:  4 WEEKS    JRegina Eck PharmD, BCPS Clinical Pharmacist, [place] Phone [phone]

## 2021-08-09 NOTE — Patient Instructions (Signed)
Visit Information ? ?Following are the goals we discussed today:  ?Current Barriers:  ?Unable to independently afford treatment regimen ?Unable to achieve control of T2DM  ? ?Pharmacist Clinical Goal(s):  ?Over the next 90 days, patient will verbalize ability to afford treatment regimen ?achieve control of T2DM as evidenced by IMPROVED GLYCEMIC CONTROL through collaboration with PharmD and provider.  ? ?Interventions: ?1:1 collaboration with Loman Brooklyn, FNP regarding development and update of comprehensive plan of care as evidenced by provider attestation and co-signature ?Inter-disciplinary care team collaboration (see longitudinal plan of care) ?Comprehensive medication review performed; medication list updated in electronic medical record ? ?Diabetes: ?Uncontrolled-A1C 7.4%-BLOOD SUGARS ARE within goal range!; current treatment: GLIPIZIDE, TRULICITY 3mg  SQ WEEKLY TODAY; JANUVIA ?INTOLERANCES ?METFORMIN (DUE TO GI) ?DISCONTINUED FARXIGA DUE TO RECURRENT UTI/YEAST INFECTIONS DESPITE CONTROLLED SUGARS ?INCREASED TRULICITY TO 3MG  WEEKLY ?Denies personal and family history of Medullary thyroid cancer (MTC) ?D/C'D OZEMPIC DUE TO GI SIDE EFFECTS (TOLERATES TRULICITY) ?PATIENT HAS BEEN CONTINUING TO TAKE JANUVIA DESPITE COUNSELING TO STOP ?WILL ADDRESS AT PCP VISIT NEXT WEEK ?SHE DENIES ADDITIONAL SIDE EFFECTS AND REPORTS JANUVIA IS HELPING HER ?CONTINUE GLIPIZIDE  ?Current glucose readings: fasting glucose: <130, post prandial glucose: <200 MUCH IMPROVED ? ?Patient received her Libre 2 in the [place] ?Extensive education provided on FS libre 2 CGM system; pt able to demonstrated and teachback ?INSTRUCTED HOW TO ORDER REFILLS via adv dm supply ?Telephone: [phone] ?Denies hypoglycemic/hyperglycemic symptoms ?Discussed meal planning options and Plate method for healthy eating--Patient admits to not eating wisely, she is trying to work on this ?Avoid sugary drinks and  desserts ?Incorporate balanced protein, non starchy veggies, 1 serving of carbohydrate with each meal ?Increase water intake ?Increase physical activity as able ?Current exercise: active job  ?Educated on current medications, patient on pill packaging with [place] ?Recommended SWITCH BACK TO TRULICITY, INCREASE GLIPIZIDE, MAY HAVE TO ADD INSULIN, LIBRE ? ?Discussed Blood pressure and cholesterol--stable, continue current medications as prescribed, patient using pill packs monthly ?May titrate/switch statin if LDL not at goal at f/u-->would prefer rosuvastatin vs atorva (pt less likely to have myopathy which she sometimes reports) ? ?Patient Goals/Self-Care Activities ?Over the next 90 days, patient will:  ?- take medications as prescribed ?check glucose daily (fasting) or if symptomatic, document, and provide at future appointments ? ?Follow Up Plan: Telephone follow up appointment with care management team member scheduled for: 1 month ? ? ?Plan: Telephone follow up appointment with care management team member scheduled for:  4 weeks ? ?Signature ?Regina Eck, PharmD, BCPS ?Clinical Pharmacist, [facility] ?[place]  [place] Phone [phone] ? ?Please call the care guide team at [phone] if you need to cancel or reschedule your appointment.  ? ?The patient verbalized understanding of instructions, educational materials, and care plan provided today and declined offer to receive copy of patient instructions, educational materials, and care plan.  ? ? ?

## 2021-08-13 ENCOUNTER — Ambulatory Visit: Payer: Medicare Other | Attending: Pulmonary Disease | Admitting: Pulmonary Disease

## 2021-08-13 ENCOUNTER — Other Ambulatory Visit: Payer: Self-pay

## 2021-08-13 DIAGNOSIS — G4733 Obstructive sleep apnea (adult) (pediatric): Secondary | ICD-10-CM | POA: Diagnosis not present

## 2021-08-15 ENCOUNTER — Telehealth: Payer: Self-pay | Admitting: Pulmonary Disease

## 2021-08-15 DIAGNOSIS — G4733 Obstructive sleep apnea (adult) (pediatric): Secondary | ICD-10-CM

## 2021-08-15 NOTE — Telephone Encounter (Signed)
Please let her know her recent sleep study was reviewed.  Please send order to arrange for a Resmed auto CPAP 5 to 10 cm H2O with heated humidity and mask of choice.  She needs to then be set up for an overnight oximetry with her using auto CPAP.  Please schedule an ROV in 4 to 5 months. ?

## 2021-08-15 NOTE — Procedures (Signed)
? ? ? ?Patient Name: Cynthia, Deleon ?Study Date: 08/13/2021 ?Gender: Female ?D.O.B: 1956/02/08 ?Age (years): 14 ?Referring Provider: Chesley Mires MD, ABSM ?Height (inches): 69 ?Interpreting Physician: Chesley Mires MD, ABSM ?Weight (lbs): 185 ?RPSGT: Rosebud Poles ?BMI: 27 ?MRN: [id number] ?Neck Size: 15.50 ? ?CLINICAL INFORMATION ?The patient is referred for a CPAP titration to treat sleep apnea. ? ?Date of NPSG 04/30/21: AHI 6.5, SpO2 low 84%, and she spent 162.5 minutes of test time with an SpO2 < 88%. ? ?SLEEP STUDY TECHNIQUE ?As per the AASM Manual for the Scoring of Sleep and Associated Events v2.3 (April 2016) with a hypopnea requiring 4% desaturations. ? ?The channels recorded and monitored were frontal, central and occipital EEG, electrooculogram (EOG), submentalis EMG (chin), nasal and oral airflow, thoracic and abdominal wall motion, anterior tibialis EMG, snore microphone, electrocardiogram, and pulse oximetry. Continuous positive airway pressure (CPAP) was initiated at the beginning of the study and titrated to treat sleep-disordered breathing. ? ?MEDICATIONS ?Medications self-administered by patient taken the night of the study : N/A ? ?TECHNICIAN COMMENTS ?Comments added by technician: CPAP therarpy started at 4 CWP and increased to 6 CWP due to snoring and events. Patient tolerated CPAP therapy very well. CPAP started with O2 supplement of 2 lpm per doctors' order. Optimal pressure obtained due to REM - supine stages were noticed ?Comments added by scorer: N/A ? ?RESPIRATORY PARAMETERS ?Optimal PAP Pressure (cm): 6 AHI at Optimal Pressure (/hr): 0 ?Overall Minimal O2 (%): 89.00 Supine % at Optimal Pressure (%): 37 ?Minimal O2 at Optimal Pressure (%): 89.0  ? ?The study was started on CPAP and 2 liters supplemental oxygen.  She was not tried on CPAP without supplemental oxygen. ? ?SLEEP ARCHITECTURE ?The study was initiated at 10:51:09 PM and ended at 6:04:30 AM. ? ?Sleep onset time was 1.3  minutes and the sleep efficiency was 95.1%. The total sleep time was 412.1 minutes. ? ?The patient spent 2.18% of the night in stage N1 sleep, 60.81% in stage N2 sleep, 12.01% in stage N3 and 25% in REM.Stage REM latency was 70.0 minutes ? ?Wake after sleep onset was 20.0. Alpha intrusion was absent. Supine sleep was 37.86%. ? ?CARDIAC DATA ?The 2 lead EKG demonstrated sinus rhythm. The mean heart rate was 76.85 beats per minute. Other EKG findings include: None. ? ?LEG MOVEMENT DATA ?The total Periodic Limb Movements of Sleep (PLMS) were 0. The PLMS index was 0.00. A PLMS index of <15 is considered normal in adults. ? ?IMPRESSIONS ?- She did well with CPAP at 6 cm H2O. ?- It is not clear whether she needs supplemental oxygen with CPAP since the entire study was conducted with her using 2 liters oxygen and CPAP. ? ?DIAGNOSIS ?- Obstructive Sleep Apnea (G47.33) ? ?RECOMMENDATIONS ?- Trial of auto CPAP 5 to 10 cm H2O.  She should then undergo an overnight oximetry to determine if she needs supplemental oxygen at night with CPAP. ?- She was fitted with a Medium size Resmed Nasal Airfit N20 mask and heated humidification. ?- Avoid alcohol, sedatives and other CNS depressants that may worsen sleep apnea and disrupt normal sleep architecture. ?- Sleep hygiene should be reviewed to assess factors that may improve sleep quality. ?- Weight management and regular exercise should be initiated or continued. ? ?[Electronically signed] 08/15/2021 09:50 AM ? ?Chesley Mires MD, ABSM ?Diplomate, Tax adviser of Sleep Medicine ?NPI: [id number] ? ?[place] ?PH: [phone]   FX: [phone] ?ACCREDITED BY THE AMERICAN ACADEMY OF SLEEP MEDICINE ? ?

## 2021-08-15 NOTE — Telephone Encounter (Signed)
ATC patient.  LMTCB. 

## 2021-08-16 DIAGNOSIS — D225 Melanocytic nevi of trunk: Secondary | ICD-10-CM | POA: Diagnosis not present

## 2021-08-16 DIAGNOSIS — I872 Venous insufficiency (chronic) (peripheral): Secondary | ICD-10-CM | POA: Diagnosis not present

## 2021-08-16 DIAGNOSIS — L448 Other specified papulosquamous disorders: Secondary | ICD-10-CM | POA: Diagnosis not present

## 2021-08-16 DIAGNOSIS — L821 Other seborrheic keratosis: Secondary | ICD-10-CM | POA: Diagnosis not present

## 2021-08-16 DIAGNOSIS — L218 Other seborrheic dermatitis: Secondary | ICD-10-CM | POA: Diagnosis not present

## 2021-08-16 DIAGNOSIS — L718 Other rosacea: Secondary | ICD-10-CM | POA: Diagnosis not present

## 2021-08-16 DIAGNOSIS — L570 Actinic keratosis: Secondary | ICD-10-CM | POA: Diagnosis not present

## 2021-08-16 DIAGNOSIS — L814 Other melanin hyperpigmentation: Secondary | ICD-10-CM | POA: Diagnosis not present

## 2021-08-16 NOTE — Telephone Encounter (Signed)
Called and spoke to patient she voiced understanding. Order placed and nothing further needed ?

## 2021-08-16 NOTE — Telephone Encounter (Signed)
She should keep home oxygen set up for now.  She should just use CPAP at night once she gets her CPAP device.  Will do overnight oximetry with CPAP and then determine if she needs to add supplemental oxygen in with CPAP. ?

## 2021-08-16 NOTE — Telephone Encounter (Signed)
Called patient and went over results  with patient. She voiced understanding about results and states that she currently is using 3LO2 at night while sleeping and would like to know if she should continue this with the cpap. She states that she is too nervous to give up O2 while sleeping. Informed patient that the ONO test with cpap would let us know if she needs to continue with O2 at night. Patient voiced understanding but would like Dr. Halford Chessman to verify for her. Please advise, thanks!  ?

## 2021-08-17 ENCOUNTER — Encounter: Payer: Medicare Other | Admitting: Family Medicine

## 2021-08-20 NOTE — Congregational Nurse Program (Signed)
?  Dept: [phone] ? ? ?Congregational Nurse Program Note ? ?Date of Encounter: 08/20/2021 ? ?Past Medical History: ?Past Medical History:  ?Diagnosis Date  ? Arthritis   ? hands and knees  ? Bipolar 1 disorder ([place])   ? Borderline diabetic   ? Chronic back pain   ? Chronic neck pain   ? Complication of anesthesia   ? high anxiety-does not want to be alone  ? COPD (chronic obstructive pulmonary disease) ([place])   ? Current use of estrogen therapy 01/12/2014  ? Depression   ? Diabetes mellitus without complication ([place])   ? Diplopia   ? Family history of adverse reaction to anesthesia   ? sister "gas in lungs"  ? GERD (gastroesophageal reflux disease)   ? Headache   ? Hot flashes 12/15/2013  ? Hypertension   ? IBS (irritable bowel syndrome)   ? Incontinence   ? Kidney stone   ? 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 Panic attacks   ? Peptic ulcer   ? Peripheral neuropathy   ? Rosacea   ? Shingles   ? Sleep apnea   ? uses a cpap-with oxygen  ? ? ?Encounter Details: ? CNP Questionnaire - 08/20/21 0900   ? ?  ? Questionnaire  ? Location Patient [employer], [place]   25/40  ? Visit Setting Church or Organization   ? Patient Status Unknown   ? Insurance Medicare   ? Insurance Referral N/A   ? Medication N/A   ? Medical Provider Yes   ? Screening Referrals N/A   ? Medical Referral N/A   ? Medical Appointment Made N/A   ? Food Have Food Insecurities;Referred to Food [employer]   ? Transportation N/A   ? Housing/Utilities N/A   ? Interpersonal Safety N/A   ? Intervention Blood pressure;Blood glucose;Support;Educate   ? ED Visit Averted N/A   ? Life-Saving Intervention Made N/A   ? ?  ?  ? ?  ? ? ?Encountered Ms Knapke at the [employer] in [place] .  ?She requested a blood pressure check as well as glucose check. ?She reports she has a provider at [facility] and is taking medications for Diabetes and hypertension but is unsure of the names of all. ?She is insured and reports she has a CGM system and  has checked her blood sugar earlier in 140s. ? ?She reports she is fasting. ?Blood sugar capillary result 136 fasting ?Blood pressure left arm sitting with regular cuff 151/76 pulse 88 ?She reports she has not taken her blood pressure medication today.  ?Recommended she take her medication as directed by her provider when she gets back home and to continue to go to her primary care provider for follow ups as directed. She states understanding. ? ?Debria Garret RN ?[facility] ? ? ?

## 2021-08-22 ENCOUNTER — Ambulatory Visit: Payer: Medicare Other | Admitting: Adult Health

## 2021-08-23 ENCOUNTER — Ambulatory Visit: Payer: Medicare Other | Admitting: Family Medicine

## 2021-08-23 DIAGNOSIS — E1165 Type 2 diabetes mellitus with hyperglycemia: Secondary | ICD-10-CM | POA: Diagnosis not present

## 2021-08-24 ENCOUNTER — Telehealth: Payer: Self-pay | Admitting: Family Medicine

## 2021-08-24 ENCOUNTER — Other Ambulatory Visit: Payer: Self-pay | Admitting: Gastroenterology

## 2021-08-24 ENCOUNTER — Encounter: Payer: Self-pay | Admitting: Family Medicine

## 2021-08-24 ENCOUNTER — Ambulatory Visit (INDEPENDENT_AMBULATORY_CARE_PROVIDER_SITE_OTHER): Payer: Medicare Other | Admitting: Family Medicine

## 2021-08-24 VITALS — BP 113/73 | HR 113 | Temp 96.3°F | Ht 69.0 in | Wt 177.0 lb

## 2021-08-24 DIAGNOSIS — S91209A Unspecified open wound of unspecified toe(s) with damage to nail, initial encounter: Secondary | ICD-10-CM

## 2021-08-24 DIAGNOSIS — S91114A Laceration without foreign body of right lesser toe(s) without damage to nail, initial encounter: Secondary | ICD-10-CM | POA: Diagnosis not present

## 2021-08-24 DIAGNOSIS — S99921A Unspecified injury of right foot, initial encounter: Secondary | ICD-10-CM | POA: Diagnosis not present

## 2021-08-24 MED ORDER — CEPHALEXIN 500 MG PO CAPS: 500.0000 mg | ORAL_CAPSULE | Freq: Two times a day (BID) | ORAL | 0 refills | Status: AC

## 2021-08-24 MED ORDER — FLUCONAZOLE 150 MG PO TABS: 150.0000 mg | ORAL_TABLET | Freq: Once | ORAL | 0 refills | Status: AC

## 2021-08-24 NOTE — Progress Notes (Signed)
? ?Assessment & Plan:  ?1. Laceration of middle toe, right, initial encounter ?Patient to leave current dressing in place until Sunday. Do not get foot wet until then. Then wash with mild soap and water daily. Plan to re-check wound next week and remove sutures the following (12-14 days). Started on prophylactic antibiotics. Diflucan prescribed due to patient report of yeast infections when she takes antibiotics.  ?- cephALEXin (KEFLEX) 500 MG capsule; Take 1 capsule (500 mg total) by mouth 2 (two) times daily for 7 days.  Dispense: 14 capsule; Refill: 0 ?- fluconazole (DIFLUCAN) 150 MG tablet; Take 1 tablet (150 mg total) by mouth once for 1 dose. May repeat after 3 days if needed.  Dispense: 2 tablet; Refill: 0 ?- Laceration repair ? ?2. Injury of toe on right foot, initial encounter ?4th toe buddy taped to third toe. ? ?3. Avulsion of toenail, initial encounter ?Lifted portion of toenail removed. ? ? ?Follow up plan: Return in about 6 days (around 08/30/2021) for wound check. ? ?[name], MSN, APRN, FNP-C ?[place] ? ?Subjective:  ? ?[name], female    DOB: 03/27/1956, 66 y.o.   MRN: [id number] ? ?HPI: ?Cynthia Deleon is a 66 y.o. female presenting on 08/24/2021 for Fall (Patient had a fall this morning and now has a laceration on right foot. ) ? ?Patient is accompanied by her friend who she is okay with being present.  ? ?Patient sustained an injury to her right foot this morning when she had a fall. Fall was a result of vertigo. She is here because she has diabetes and peripheral neuropathy. She is worried about her foot getting infected.  ? ? ?ROS: Negative unless specifically indicated above in HPI.  ? ?Relevant past medical history reviewed and updated as indicated.  ? ?Allergies and medications reviewed and updated. ? ? ?Current Outpatient Medications:  ?  albuterol (VENTOLIN HFA) 108 (90 Base) MCG/ACT inhaler, Inhale 2 puffs into the lungs every 6 (six)  hours as needed., Disp: 18 g, Rfl: 2 ?  atorvastatin (LIPITOR) 40 MG tablet, Take 1 tablet (40 mg total) by mouth daily., Disp: 90 tablet, Rfl: 1 ?  azelastine (ASTELIN) 0.1 % nasal spray, Place 1 spray into both nostrils 2 (two) times daily. Use in each nostril as directed, Disp: 30 mL, Rfl: 12 ?  azithromycin (ZITHROMAX Z-PAK) 250 MG tablet, Take 2 tablets (500 mg) PO today, then 1 tablet (250 mg) PO daily x4 days., Disp: 6 tablet, Rfl: 0 ?  benzonatate (TESSALON) 100 MG capsule, Take 200 mg by mouth 3 (three) times daily as needed., Disp: , Rfl:  ?  budesonide-formoterol (SYMBICORT) 160-4.5 MCG/ACT inhaler, Inhale 2 puffs into the lungs in the morning and at bedtime., Disp: 1 each, Rfl: 6 ?  Continuous Blood Gluc Sensor (FREESTYLE LIBRE 2 SENSOR) MISC, by Does not apply route., Disp: , Rfl:  ?  cyclobenzaprine (FLEXERIL) 10 MG tablet, , Disp: , Rfl:  ?  diazepam (VALIUM) 5 MG tablet, Take 5 mg by mouth in the morning and at bedtime. , Disp: , Rfl:  ?  Dulaglutide (TRULICITY) 3 KD/9.8PJ SOPN, Inject 3 mg as directed once a week. DX: E11.65, Disp: 2 mL, Rfl: 4 ?  DULoxetine (CYMBALTA) 60 MG capsule, Take 1 capsule (60 mg total) by mouth daily., Disp: 30 capsule, Rfl: 11 ?  fluticasone (FLONASE) 50 MCG/ACT nasal spray, Place 2 sprays into both nostrils daily., Disp: 16 g, Rfl: 5 ?  glipiZIDE (GLUCOTROL  XL) 5 MG 24 hr tablet, Take 1 tablet (5 mg total) by mouth 2 (two) times daily with a meal., Disp: 60 tablet, Rfl: 5 ?  glucose blood (CONTOUR NEXT TEST) test strip, Test BS 4 times daily Dx E11.9, Disp: 400 strip, Rfl: 3 ?  JANUVIA 100 MG tablet, Take 100 mg by mouth daily., Disp: , Rfl:  ?  lamoTRIgine (LAMICTAL) 200 MG tablet, Take 200 mg by mouth at bedtime. For mood disorder., Disp: , Rfl:  ?  lansoprazole (PREVACID) 30 MG capsule, Take 1 capsule (30 mg total) by mouth daily at 12 noon., Disp: 30 capsule, Rfl: 12 ?  lisinopril (ZESTRIL) 5 MG tablet, Take 1 tablet (5 mg total) by mouth daily., Disp: 90 tablet,  Rfl: 1 ?  loratadine (CLARITIN) 10 MG tablet, Take 1 tablet (10 mg total) by mouth daily., Disp: 30 tablet, Rfl: 11 ?  meclizine (ANTIVERT) 25 MG tablet, Take 2 tablets (50 mg total) by mouth daily as needed for dizziness., Disp: 60 tablet, Rfl: 2 ?  methylPREDNISolone (MEDROL DOSEPAK) 4 MG TBPK tablet, Use as directed., Disp: 21 each, Rfl: 0 ?  montelukast (SINGULAIR) 10 MG tablet, TAKE (1) TABLET BY MOUTH AT BEDTIME., Disp: 90 tablet, Rfl: 1 ?  ondansetron (ZOFRAN-ODT) 4 MG disintegrating tablet, DISSOLVE 1 TABLET BY MOUTH EVERY 8 HOURS AS NEEDED FOR NAUSEA OR VOMITING., Disp: 20 tablet, Rfl: 0 ?  OneTouch Delica Lancets 07P MISC, Use to test blood sugar twice daily. DX: E11.9, Disp: 100 each, Rfl: 3 ?  oxybutynin (DITROPAN-XL) 10 MG 24 hr tablet, TAKE (1) TABLET BY MOUTH AT BEDTIME., Disp: 90 tablet, Rfl: 1 ?  oxyCODONE (ROXICODONE) 15 MG immediate release tablet, Take 7.5 mg by mouth every 6 (six) hours as needed. , Disp: , Rfl:  ?  pregabalin (LYRICA) 150 MG capsule, Take 1 capsule (150 mg total) by mouth 2 (two) times daily., Disp: 180 capsule, Rfl: 1 ?  PREMARIN 0.3 MG tablet, TAKE (1) TABLET BY MOUTH ONCE DAILY., Disp: 30 tablet, Rfl: 5 ?  promethazine (PHENERGAN) 12.5 MG tablet, Take 1 tablet (12.5 mg total) by mouth every 8 (eight) hours as needed for nausea or vomiting., Disp: 20 tablet, Rfl: 0 ?  temazepam (RESTORIL) 15 MG capsule, Take 15 mg by mouth at bedtime., Disp: , Rfl:  ?  traZODone (DESYREL) 100 MG tablet, Take 100 mg by mouth at bedtime., Disp: , Rfl:  ?  valACYclovir (VALTREX) 500 MG tablet, TAKE (1) TABLET BY MOUTH ONCE DAILY AS NEEDED FOR SHINGLES FLARE UP., Disp: 30 tablet, Rfl: 0 ? ?Allergies  ?Allergen Reactions  ? Chantix [Varenicline] Other (See Comments)  ?  Altered mental status-Per patient was put on allergy list by Dr Lorriane Shire bu patient staes she is not allergic to this and is currently taking it.   ? Divalproex Sodium Other (See Comments)  ?  Hallucinations  ?Other reaction(s):  Confusion  ? Other Anaphylaxis  ? Pseudoeph-Hydrocodone-Gg Nausea Only  ? Sulfa Antibiotics Anaphylaxis, Nausea Only, Swelling and Other (See Comments)  ?  Swelling of tongue.  ? Varenicline Tartrate Other (See Comments)  ?  Other reaction(s): Confusion  ? Ambien [Zolpidem] Other (See Comments)  ?  Causes sleep walking  ? Clavulanic Acid Diarrhea  ? Codeine Nausea And Vomiting and Other (See Comments)  ? Elemental Sulfur Other (See Comments)  ?  Swelling of tongue  ? Hydrocod Poli-Chlorphe Poli Er Other (See Comments)  ?  Other reaction(s): Skin itches ?  ? Hydrocodone Nausea  And Vomiting  ? Macrobid [Nitrofurantoin] Nausea And Vomiting  ?  Dizziness, nausea, vomiting  ? Paxil [Paroxetine Hcl] Other (See Comments)  ?  Causes ringing in the ears.   ? Prednisone Other (See Comments)  ?  Other reaction(s): Unknown ?  ? Amoxicillin Rash  ?  Has patient had a PCN reaction causing immediate rash, facial/tongue/throat swelling, SOB or lightheadedness with hypotension: No ?Has patient had a PCN reaction causing severe rash involving mucus membranes or skin necrosis: No ?Has patient had a PCN reaction that required hospitalization: No ?Has patient had a PCN reaction occurring within the last 10 years: Yes ?If all of the above answers are "NO", then may proceed with Cephalosporin use. ?  ? [place] [Dapagliflozin]   ?  RECURRENT YEAST/UTI  ? Metformin And Related Diarrhea  ? Tape Rash  ? ? ?Objective:  ? ?BP 113/73   Pulse (!) 113   Temp (!) 96.3 ?F (35.7 ?C) (Temporal)   Ht '5\' 9"'$  (1.753 m)   Wt 177 lb (80.3 kg)   BMI 26.14 kg/m?   ? ?Physical Exam ?Vitals reviewed.  ?Constitutional:   ?   General: She is not in acute distress. ?   Appearance: Normal appearance. She is not ill-appearing, toxic-appearing or diaphoretic.  ?HENT:  ?   Head: Normocephalic and atraumatic.  ?Eyes:  ?   General: No scleral icterus.    ?   Right eye: No discharge.     ?   Left eye: No discharge.  ?   Conjunctiva/sclera: Conjunctivae normal.   ?Cardiovascular:  ?   Rate and Rhythm: Normal rate.  ?Pulmonary:  ?   Effort: Pulmonary effort is normal. No respiratory distress.  ?Musculoskeletal:     ?   General: Normal range of motion.  ?   Cervical back: No

## 2021-08-24 NOTE — Telephone Encounter (Signed)
Call pt coming in at [phone] to see Blanch Media  ?

## 2021-08-27 ENCOUNTER — Encounter: Payer: Self-pay | Admitting: Adult Health

## 2021-08-27 ENCOUNTER — Encounter: Payer: Self-pay | Admitting: Family Medicine

## 2021-08-27 ENCOUNTER — Ambulatory Visit: Payer: Medicare Other | Admitting: Adult Health

## 2021-08-27 NOTE — Progress Notes (Deleted)
? ? ?ASSESSMENT AND PLAN ?66 y.o. female   ?Progressive worsening numbness of bilateral lower extremity, reported gait abnormality ? MRI cervical mild degenerative changes, no evidence of spinal cord or nerve root compression ? MRI thoracic - awaiting to be completed  ?EMG nerve conduction study previously showed no large fiber peripheral neuropathy ? MRI of lumbar showed only mild degenerative changes, no evidence of canal or foraminal narrowing ? ?Right lower extremity swelling, out of proportion to left side, tenderness upon deep palpitation, ? Venous Doppler to rule out right lower extremity DVT ? ?DIAGNOSTIC DATA (LABS, IMAGING, TESTING) ?- I reviewed patient records, labs, notes, testing and imaging myself where available. ?MRI of lumbar spine July 2020, mild degenerative changes, no significant canal foraminal narrowing ? ? ? ?HISTORICAL ? ?Cynthia Deleon is a 66 year old female, seen in request by primary care nurse practitioner [name] for evaluation of double vision, ? ?I reviewed and summarized the referring note.  Past medical history ?Bipolar disorder, polypharmacy treatment, ?Hyperlipidemia ?Diabetes, ?COPD ?Chronic urinary urgency ?Obstructive sleep apnea, using CPAP machine, ? ?I saw her in 2018 for progressive bilateral lower extremity paresthesia since 2012, EMG nerve conduction study in November 2017 showed no large fiber peripheral neuropathy ? 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 ?She reported few recurrent episode of double vision since September 2021, it will happen while driving, the dividing line between lanes becomes blurry, lasting for 30 minutes, she denies dizziness, has baseline chronic low back pain, mild unsteady gait, she denies lateralized motor or sensory deficit during the spell.  She was seen by ophthalmologist, was told normal ? ?She denies ptosis, no bulbar weakness, no  limb muscle weakness. ? ?UPDATE May 21 2021 Dr. Krista Blue: ?She stated she no longer has double vision, previous extensive evaluation including acetylcholine receptor antibody was negative ? ?Normal MRI of the brain in October 2021 ? ?She continue complains of bilateral lower extremity numbness, involving lower extremity from knee down, also complains of low back pain, mild unsteady gait, denies bowel or bladder incontinence, ? ?EMG nerve conduction study in 2017 was within normal limit, ?Skin biopsy in 2018 showed evidence of small fiber neuropathy ? ?She also complains of few months history of right leg swelling, tenderness upon deep palpitation ? ? ?UPDATE 08/27/2021 Cynthia Deleon: No recurrence of diplopia or any new neurological symptoms.  Continues to have lower extremity numbness in stocking distribution, low back pain and mild gait unsteadiness. ? ?MR cervical 06/2021 showed mild degenerative changes but no evidence of spinal cord or nerve root compression. Order placed to complete thoracic spine but not yet completed.  ? ? ? ? ? ?PHYSICAL EXAM ?  ?Vitals:  ? 05/21/21 1309  ?BP: 135/72  ?Pulse: 84  ?Weight: 178 lb (80.7 kg)  ?Height: '5\' 9"'$  (1.753 m)  ? ?Not recorded ?  ? ? ?Body mass index is 26.29 kg/m?. ? ?PHYSICAL EXAMNIATION: ? ?Gen: NAD, conversant, well nourised, well groomed                     ?Cardiovascular: Regular rate rhythm, no peripheral edema, warm, nontender. ?Eyes: Conjunctivae clear without exudates or hemorrhage ?Neck: Supple, no carotid bruits. ?Pulmonary: Clear to auscultation bilaterally  ? ?NEUROLOGICAL EXAM: ? ?MENTAL STATUS: ?Speech: ?   Speech is normal; fluent and spontaneous with normal comprehension.  ?Cognition: ?    Orientation to time, place and person ?  Normal recent and remote memory ?    Normal Attention span and concentration ?    Normal Language, naming, repeating,spontaneous speech ?    Fund of knowledge ?  ?CRANIAL NERVES: ?CN II: Visual fields are full to confrontation. Pupils are  round equal and briskly reactive to light. ?CN III, IV, VI: extraocular movement are normal.  Cover and uncover showed no significant extraocular eye movement disorder, no ptosis. ?CN V: Facial sensation is intact to light touch ?CN VII: Face is symmetric with normal eye closure  ?CN VIII: Hearing is normal to causal conversation. ?CN IX, X: Phonation is normal. ?CN XI: Head turning and shoulder shrug are intact ? ?MOTOR: No significant bilateral upper and lower extremity proximal distal muscle weakness, right lower extremity swelling, tenderness upon deep palpitation, ? ?REFLEXES: ?Reflexes are 3 and symmetric at the biceps, triceps, knees, and ankles. Plantar responses are flexor. ? ?SENSORY: ?Intact to light touch, pinprick and vibratory sensation are intact in fingers and toes. ? ?COORDINATION: ?There is no trunk or limb dysmetria noted. ? ?GAIT/STANCE: ?She needs push-up to get up from seated position, mildly unsteady, difficulty with tandem walking ? ?REVIEW OF SYSTEMS: Full 14 system review of systems performed and notable only for as above ?All other review of systems were negative. ? ?ALLERGIES: ?Allergies  ?Allergen Reactions  ? Chantix [Varenicline] Other (See Comments)  ?  Altered mental status-Per patient was put on allergy list by Dr Lorriane Shire bu patient staes she is not allergic to this and is currently taking it.   ? Divalproex Sodium Other (See Comments)  ?  Hallucinations  ?Other reaction(s): Confusion  ? Other Anaphylaxis  ? Pseudoeph-Hydrocodone-Gg Nausea Only  ? Sulfa Antibiotics Anaphylaxis, Nausea Only, Swelling and Other (See Comments)  ?  Swelling of tongue.  ? Varenicline Tartrate Other (See Comments)  ?  Other reaction(s): Confusion  ? Ambien [Zolpidem] Other (See Comments)  ?  Causes sleep walking  ? Clavulanic Acid Diarrhea  ? Codeine Nausea And Vomiting and Other (See Comments)  ? Elemental Sulfur Other (See Comments)  ?  Swelling of tongue  ? Hydrocod Poli-Chlorphe Poli Er Other (See  Comments)  ?  Other reaction(s): Skin itches ?  ? Hydrocodone Nausea And Vomiting  ? Macrobid [Nitrofurantoin] Nausea And Vomiting  ?  Dizziness, nausea, vomiting  ? Paxil [Paroxetine Hcl] Other (See Comments)  ?  Causes ringing in the ears.   ? Prednisone Other (See Comments)  ?  Other reaction(s): Unknown ?  ? Amoxicillin Rash  ?  Has patient had a PCN reaction causing immediate rash, facial/tongue/throat swelling, SOB or lightheadedness with hypotension: No ?Has patient had a PCN reaction causing severe rash involving mucus membranes or skin necrosis: No ?Has patient had a PCN reaction that required hospitalization: No ?Has patient had a PCN reaction occurring within the last 10 years: Yes ?If all of the above answers are "NO", then may proceed with Cephalosporin use. ?  ? [place] [Dapagliflozin]   ?  RECURRENT YEAST/UTI  ? Metformin And Related Diarrhea  ? Tape Rash  ? ? ?HOME MEDICATIONS: ?Current Outpatient Medications  ?Medication Sig Dispense Refill  ? albuterol (VENTOLIN HFA) 108 (90 Base) MCG/ACT inhaler Inhale 2 puffs into the lungs every 6 (six) hours as needed. 18 g 2  ? atorvastatin (LIPITOR) 40 MG tablet Take 1 tablet (40 mg total) by mouth daily. 90 tablet 1  ? azelastine (ASTELIN) 0.1 % nasal spray Place 1 spray into both nostrils 2 (two) times  daily. Use in each nostril as directed 30 mL 12  ? azithromycin (ZITHROMAX Z-PAK) 250 MG tablet Take 2 tablets (500 mg) PO today, then 1 tablet (250 mg) PO daily x4 days. 6 tablet 0  ? benzonatate (TESSALON) 100 MG capsule Take 200 mg by mouth 3 (three) times daily as needed.    ? budesonide-formoterol (SYMBICORT) 160-4.5 MCG/ACT inhaler Inhale 2 puffs into the lungs in the morning and at bedtime. 1 each 6  ? cephALEXin (KEFLEX) 500 MG capsule Take 1 capsule (500 mg total) by mouth 2 (two) times daily for 7 days. 14 capsule 0  ? Continuous Blood Gluc Sensor (FREESTYLE LIBRE 2 SENSOR) MISC by Does not apply route.    ? cyclobenzaprine (FLEXERIL) 10 MG tablet      ? diazepam (VALIUM) 5 MG tablet Take 5 mg by mouth in the morning and at bedtime.     ? Dulaglutide (TRULICITY) 3 ZW/2.5EN SOPN Inject 3 mg as directed once a week. DX: E11.65 2 mL 4  ? DULoxetine (CY

## 2021-08-30 ENCOUNTER — Encounter: Payer: Self-pay | Admitting: Family Medicine

## 2021-08-30 ENCOUNTER — Ambulatory Visit (INDEPENDENT_AMBULATORY_CARE_PROVIDER_SITE_OTHER): Payer: Medicare Other | Admitting: Family Medicine

## 2021-08-30 VITALS — BP 134/79 | HR 88 | Temp 97.5°F | Ht 69.0 in | Wt 176.8 lb

## 2021-08-30 DIAGNOSIS — S91114D Laceration without foreign body of right lesser toe(s) without damage to nail, subsequent encounter: Secondary | ICD-10-CM

## 2021-08-30 NOTE — Progress Notes (Signed)
? ?Assessment & Plan:  ?1. Laceration of middle toe, right, subsequent encounter ?Healing well. Continue daily wound care.  ? ? ?Follow up plan: Return in about 8 days (around 09/07/2021) for suture removal. ? ?[name], MSN, APRN, FNP-C ?[place] ? ?Subjective:  ? ?[name], female    DOB: 1956/03/28, 66 y.o.   MRN: [id number] ? ?HPI: ?Cynthia Deleon is a 66 y.o. female presenting on 08/30/2021 for Wound Check ? ?Patient is here to reassess the laceration of her right foot. Sutures were placed six days ago and she was started on an antibiotic. She has been wearing a boot to prevent her from popping the sutures out.  ? ? ?ROS: Negative unless specifically indicated above in HPI.  ? ?Relevant past medical history reviewed and updated as indicated.  ? ?Allergies and medications reviewed and updated. ? ? ?Current Outpatient Medications:  ?  albuterol (VENTOLIN HFA) 108 (90 Base) MCG/ACT inhaler, Inhale 2 puffs into the lungs every 6 (six) hours as needed., Disp: 18 g, Rfl: 2 ?  atorvastatin (LIPITOR) 40 MG tablet, Take 1 tablet (40 mg total) by mouth daily., Disp: 90 tablet, Rfl: 1 ?  azelastine (ASTELIN) 0.1 % nasal spray, Place 1 spray into both nostrils 2 (two) times daily. Use in each nostril as directed, Disp: 30 mL, Rfl: 12 ?  budesonide-formoterol (SYMBICORT) 160-4.5 MCG/ACT inhaler, Inhale 2 puffs into the lungs in the morning and at bedtime., Disp: 1 each, Rfl: 6 ?  cephALEXin (KEFLEX) 500 MG capsule, Take 1 capsule (500 mg total) by mouth 2 (two) times daily for 7 days., Disp: 14 capsule, Rfl: 0 ?  Continuous Blood Gluc Sensor (FREESTYLE LIBRE 2 SENSOR) MISC, by Does not apply route., Disp: , Rfl:  ?  cyclobenzaprine (FLEXERIL) 10 MG tablet, , Disp: , Rfl:  ?  diazepam (VALIUM) 5 MG tablet, Take 5 mg by mouth in the morning and at bedtime. , Disp: , Rfl:  ?  Dulaglutide (TRULICITY) 3 HQ/4.6NG SOPN, Inject 3 mg as directed once a week. DX: E11.65, Disp: 2 mL,  Rfl: 4 ?  DULoxetine (CYMBALTA) 60 MG capsule, Take 1 capsule (60 mg total) by mouth daily., Disp: 30 capsule, Rfl: 11 ?  fluticasone (FLONASE) 50 MCG/ACT nasal spray, Place 2 sprays into both nostrils daily., Disp: 16 g, Rfl: 5 ?  glipiZIDE (GLUCOTROL XL) 5 MG 24 hr tablet, Take 1 tablet (5 mg total) by mouth 2 (two) times daily with a meal., Disp: 60 tablet, Rfl: 5 ?  glucose blood (CONTOUR NEXT TEST) test strip, Test BS 4 times daily Dx E11.9, Disp: 400 strip, Rfl: 3 ?  JANUVIA 100 MG tablet, Take 100 mg by mouth daily., Disp: , Rfl:  ?  lamoTRIgine (LAMICTAL) 200 MG tablet, Take 200 mg by mouth at bedtime. For mood disorder., Disp: , Rfl:  ?  lansoprazole (PREVACID) 30 MG capsule, Take 1 capsule (30 mg total) by mouth daily at 12 noon., Disp: 30 capsule, Rfl: 12 ?  lisinopril (ZESTRIL) 5 MG tablet, Take 1 tablet (5 mg total) by mouth daily., Disp: 90 tablet, Rfl: 1 ?  loratadine (CLARITIN) 10 MG tablet, Take 1 tablet (10 mg total) by mouth daily., Disp: 30 tablet, Rfl: 11 ?  meclizine (ANTIVERT) 25 MG tablet, Take 2 tablets (50 mg total) by mouth daily as needed for dizziness., Disp: 60 tablet, Rfl: 2 ?  montelukast (SINGULAIR) 10 MG tablet, TAKE (1) TABLET BY MOUTH AT BEDTIME., Disp: 90 tablet,  Rfl: 1 ?  ondansetron (ZOFRAN-ODT) 4 MG disintegrating tablet, DISSOLVE 1 TABLET BY MOUTH EVERY 8 HOURS AS NEEDED FOR NAUSEA OR VOMITING., Disp: 20 tablet, Rfl: 0 ?  OneTouch Delica Lancets 51Z MISC, Use to test blood sugar twice daily. DX: E11.9, Disp: 100 each, Rfl: 3 ?  oxybutynin (DITROPAN-XL) 10 MG 24 hr tablet, TAKE (1) TABLET BY MOUTH AT BEDTIME., Disp: 90 tablet, Rfl: 1 ?  oxyCODONE (ROXICODONE) 15 MG immediate release tablet, Take 7.5 mg by mouth every 6 (six) hours as needed. , Disp: , Rfl:  ?  pregabalin (LYRICA) 150 MG capsule, Take 1 capsule (150 mg total) by mouth 2 (two) times daily., Disp: 180 capsule, Rfl: 1 ?  PREMARIN 0.3 MG tablet, TAKE (1) TABLET BY MOUTH ONCE DAILY., Disp: 30 tablet, Rfl: 5 ?   promethazine (PHENERGAN) 12.5 MG tablet, Take 1 tablet (12.5 mg total) by mouth every 8 (eight) hours as needed for nausea or vomiting., Disp: 20 tablet, Rfl: 0 ?  temazepam (RESTORIL) 15 MG capsule, Take 15 mg by mouth at bedtime., Disp: , Rfl:  ?  traZODone (DESYREL) 100 MG tablet, Take 100 mg by mouth at bedtime., Disp: , Rfl:  ?  valACYclovir (VALTREX) 500 MG tablet, TAKE (1) TABLET BY MOUTH ONCE DAILY AS NEEDED FOR SHINGLES FLARE UP., Disp: 30 tablet, Rfl: 0 ? ?Allergies  ?Allergen Reactions  ? Chantix [Varenicline] Other (See Comments)  ?  Altered mental status-Per patient was put on allergy list by Dr Lorriane Shire bu patient staes she is not allergic to this and is currently taking it.   ? Divalproex Sodium Other (See Comments)  ?  Hallucinations  ?Other reaction(s): Confusion  ? Other Anaphylaxis  ? Pseudoeph-Hydrocodone-Gg Nausea Only  ? Sulfa Antibiotics Anaphylaxis, Nausea Only, Swelling and Other (See Comments)  ?  Swelling of tongue.  ? Varenicline Tartrate Other (See Comments)  ?  Other reaction(s): Confusion  ? Ambien [Zolpidem] Other (See Comments)  ?  Causes sleep walking  ? Clavulanic Acid Diarrhea  ? Codeine Nausea And Vomiting and Other (See Comments)  ? Elemental Sulfur Other (See Comments)  ?  Swelling of tongue  ? Hydrocod Poli-Chlorphe Poli Er Other (See Comments)  ?  Other reaction(s): Skin itches ?  ? Hydrocodone Nausea And Vomiting  ? Macrobid [Nitrofurantoin] Nausea And Vomiting  ?  Dizziness, nausea, vomiting  ? Paxil [Paroxetine Hcl] Other (See Comments)  ?  Causes ringing in the ears.   ? Prednisone Other (See Comments)  ?  Other reaction(s): Unknown ?  ? Amoxicillin Rash  ?  Has patient had a PCN reaction causing immediate rash, facial/tongue/throat swelling, SOB or lightheadedness with hypotension: No ?Has patient had a PCN reaction causing severe rash involving mucus membranes or skin necrosis: No ?Has patient had a PCN reaction that required hospitalization: No ?Has patient had a PCN  reaction occurring within the last 10 years: Yes ?If all of the above answers are "NO", then may proceed with Cephalosporin use. ?  ? [place] [Dapagliflozin]   ?  RECURRENT YEAST/UTI  ? Metformin And Related Diarrhea  ? Tape Rash  ? ? ?Objective:  ? ?BP 134/79   Pulse 88   Temp (!) 97.5 ?F (36.4 ?C) (Temporal)   Ht '5\' 9"'$  (1.753 m)   Wt 176 lb 12.8 oz (80.2 kg)   BMI 26.11 kg/m?   ? ?Physical Exam ?Vitals reviewed.  ?Constitutional:   ?   General: She is not in acute distress. ?  Appearance: Normal appearance. She is not ill-appearing, toxic-appearing or diaphoretic.  ?HENT:  ?   Head: Normocephalic and atraumatic.  ?Eyes:  ?   General: No scleral icterus.    ?   Right eye: No discharge.     ?   Left eye: No discharge.  ?   Conjunctiva/sclera: Conjunctivae normal.  ?Cardiovascular:  ?   Rate and Rhythm: Normal rate.  ?Pulmonary:  ?   Effort: Pulmonary effort is normal. No respiratory distress.  ?Musculoskeletal:     ?   General: Normal range of motion.  ?   Cervical back: Normal range of motion.  ?Skin: ?   General: Skin is warm and dry.  ?   Capillary Refill: Capillary refill takes less than 2 seconds.  ?   Findings: Laceration (Healing well. 2 sutures in place. No erythema, swelling, warmth, or drainage.) present.  ?Neurological:  ?   General: No focal deficit present.  ?   Mental Status: She is alert and oriented to person, place, and time. Mental status is at baseline.  ?Psychiatric:     ?   Mood and Affect: Mood normal.     ?   Behavior: Behavior normal.     ?   Thought Content: Thought content normal.     ?   Judgment: Judgment normal.  ? ? ? ? ? ? ?

## 2021-09-03 ENCOUNTER — Ambulatory Visit: Payer: Medicare Other | Admitting: Adult Health

## 2021-09-03 ENCOUNTER — Encounter: Payer: Self-pay | Admitting: Adult Health

## 2021-09-03 ENCOUNTER — Other Ambulatory Visit: Payer: Self-pay

## 2021-09-03 VITALS — BP 145/83 | HR 92 | Ht 69.0 in | Wt 175.0 lb

## 2021-09-03 DIAGNOSIS — R269 Unspecified abnormalities of gait and mobility: Secondary | ICD-10-CM | POA: Diagnosis not present

## 2021-09-03 DIAGNOSIS — G629 Polyneuropathy, unspecified: Secondary | ICD-10-CM | POA: Diagnosis not present

## 2021-09-03 NOTE — Patient Instructions (Addendum)
Your Plan: ? ?Please contact [facility] regarding thoracic imaging results - if able, please have this imaging sent to our office for Dr. Krista Blue to review  ? ?Continue to follow with PCP for pregablin management - can consider increasing this dose if needed  ? ? ? ? ? ? ?Thank you for coming to see [place] at [facility]. I hope we have been able to provide you high quality care today. ? ?You may receive a patient satisfaction survey over the next few weeks. We would appreciate your feedback and comments so that we may continue to improve ourselves and the health of our patients. ? ? ?

## 2021-09-03 NOTE — Progress Notes (Signed)
? ? ?Primary neurologist: Cynthia Deleon ? ? ? ? ?ASSESSMENT AND PLAN ?66 y.o. female   ?Progressive worsening numbness of bilateral lower extremity, reported gait abnormality ? MRI cervical mild degenerative changes, no evidence of spinal cord or nerve root compression ? 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 MRI of lumbar showed only mild degenerative changes, no evidence of canal or foraminal narrowing ? Discussed possible benefit with PT - she plans on further discussing with her PCP ? Continue to follow with PCP for medication management currently on pregabalin '150mg'$  BID - advised to further discuss dosage increase with PCP or possibly have her current pain management clinic take over medication ? ? ? ?Follow up to be determined after review/completion of MR thoracic spine. If normal imaging, will further discuss with Cynthia Deleon if routine follow-up visit indicated at that time ? ? ? ?DIAGNOSTIC DATA (LABS, IMAGING, TESTING) ?- I reviewed patient records, labs, notes, testing and imaging myself where available. ?MRI of lumbar spine July 2020, mild degenerative changes, no significant canal foraminal narrowing ?MRI cervical 06/2021 mild foraminal narrowing at C5-6 and mild degenerative changes ? ? ? ?HISTORICAL ? ?Cynthia Deleon is a 66 year old female, seen in request by primary care nurse practitioner Cynthia Deleon for evaluation of double vision, ? ?I reviewed and summarized the referring note.  Past medical history ?Bipolar disorder, polypharmacy treatment, ?Hyperlipidemia ?Diabetes, ?COPD ?Chronic urinary urgency ?Obstructive sleep apnea, using CPAP machine, ? ?I saw her in 2018 for progressive bilateral lower extremity paresthesia since 2012, EMG nerve conduction study in November 2017 showed no large fiber peripheral  neuropathy ? 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 ?She reported few recurrent episode of double vision since September 2021, it will happen while driving, the dividing line between lanes becomes blurry, lasting for 30 minutes, she denies dizziness, has baseline chronic low back pain, mild unsteady gait, she denies lateralized motor or sensory deficit during the spell.  She was seen by ophthalmologist, was told normal ? ?She denies ptosis, no bulbar weakness, no limb muscle weakness. ? ?UPDATE May 21 2021 Cynthia Deleon: ?She stated she no longer has double vision, previous extensive evaluation including acetylcholine receptor antibody was negative ? ?Normal MRI of the brain in October 2021 ? ?She continue complains of bilateral lower extremity numbness, involving lower extremity from knee down, also complains of low back pain, mild unsteady gait, denies bowel or bladder incontinence, ? ?EMG nerve conduction study in 2017 was within normal limit, ?Skin biopsy in 2018 showed evidence of small fiber neuropathy ? ?She also complains of few months history of right leg swelling, tenderness upon deep palpitation ? ? ?UPDATE 08/27/2021 JM: No recurrence of diplopia or any new neurological symptoms.  Continues to have lower extremity numbness, low back pain and mild gait unsteadiness. She is concerned that imbalance is still present. She is on pregablin '150mg'$  twice daily by PCP. She believes this needs to be increased as she continues to have numbness from ankle down - usually worse at night time. She is followed by a pain management clinic but per patient, only treats neck and low back issues.  ? ?Further discussed cervical imaging as below. Per patient, she did have thoracic spine imaging completed at  [facility] but not showing in epic. She is frustrated by this as she wanted to review all these imaging at todays visit. She plans on  calling them when she gets home.  ? ?MR cervical 06/2021 showed mild degenerative changes but no evidence of spinal cord or nerve root compression. Order placed to complete thoracic spine but not yet completed.  ? ? ? ? ? ?PHYSICAL EXAM ?Today's Vitals  ? 09/03/21 1340  ?BP: (!) 145/83  ?Pulse: 92  ?Weight: 175 lb (79.4 kg)  ?Height: '5\' 9"'$  (1.753 m)  ? ?Body mass index is 25.84 kg/m?. ? ? ?Body mass index is 26.29 kg/m?. ? ?PHYSICAL EXAMNIATION: ? ?Gen: NAD, conversant, well nourised, well groomed                     ?Cardiovascular: Regular rate rhythm, no peripheral edema, warm, nontender. ?Eyes: Conjunctivae clear without exudates or hemorrhage ?Neck: Supple, no carotid bruits. ?Pulmonary: Clear to auscultation bilaterally  ? ?NEUROLOGICAL EXAM: ? ?MENTAL STATUS: ?Speech: ?   Speech is normal; fluent and spontaneous with normal comprehension.  ?Cognition: ?    Orientation to time, place and person ?    Normal recent and remote memory ?    Normal Attention span and concentration ?    Normal Language, naming, repeating,spontaneous speech ?    Fund of knowledge ?  ?CRANIAL NERVES: ?CN II: Visual fields are full to confrontation. Pupils are round equal and briskly reactive to light. ?CN III, IV, VI: extraocular movement are normal.   ?CN V: Facial sensation is intact to light touch ?CN VII: Face is symmetric with normal eye closure  ?CN VIII: Hearing is normal to causal conversation. ?CN IX, X: Phonation is normal. ?CN XI: Head turning and shoulder shrug are intact ? ?MOTOR: No significant bilateral upper and lower extremity proximal distal muscle weakness ? ?REFLEXES: ?Reflexes are 3 and symmetric at the biceps, triceps, knees, and ankles. Plantar responses are flexor. ? ?SENSORY: ?Intact to light touch, pinprick and vibratory sensation are intact in fingers and toes.  Subjective numbness/tingling BLE distally (ankle to toes) ? ?COORDINATION: ?There is no trunk or limb dysmetria noted. ? ?GAIT/STANCE: ?She needs  push-up to get up from seated position, mildly unsteadiness, difficulty with tandem walking ? ? ? ?REVIEW OF SYSTEMS: Full 14 system review of systems performed and notable only for as above ?All other review of systems were negative. ? ? ?ALLERGIES: ?Allergies  ?Allergen Reactions  ? Chantix [Varenicline] Other (See Comments)  ?  Altered mental status-Per patient was put on allergy list by Dr Lorriane Shire bu patient staes she is not allergic to this and is currently taking it.   ? Divalproex Sodium Other (See Comments)  ?  Hallucinations  ?Other reaction(s): Confusion  ? Other Anaphylaxis  ? Pseudoeph-Hydrocodone-Gg Nausea Only  ? Sulfa Antibiotics Anaphylaxis, Nausea Only, Swelling and Other (See Comments)  ?  Swelling of tongue.  ? Varenicline Tartrate Other (See Comments)  ?  Other reaction(s): Confusion  ? Ambien [Zolpidem] Other (See Comments)  ?  Causes sleep walking  ? Clavulanic Acid Diarrhea  ? Codeine Nausea And Vomiting and Other (See Comments)  ? Elemental Sulfur Other (See Comments)  ?  Swelling of tongue  ? Hydrocod Poli-Chlorphe Poli Er Other (See Comments)  ?  Other reaction(s): Skin itches ?  ? Hydrocodone Nausea And Vomiting  ? Macrobid [Nitrofurantoin] Nausea And Vomiting  ?  Dizziness, nausea, vomiting  ? Paxil [Paroxetine Hcl] Other (See  Comments)  ?  Causes ringing in the ears.   ? Prednisone Other (See Comments)  ?  Other reaction(s): Unknown ?  ? Amoxicillin Rash  ?  Has patient had a PCN reaction causing immediate rash, facial/tongue/throat swelling, SOB or lightheadedness with hypotension: No ?Has patient had a PCN reaction causing severe rash involving mucus membranes or skin necrosis: No ?Has patient had a PCN reaction that required hospitalization: No ?Has patient had a PCN reaction occurring within the last 10 years: Yes ?If all of the above answers are "NO", then may proceed with Cephalosporin use. ?  ? [place] [Dapagliflozin]   ?  RECURRENT YEAST/UTI  ? Metformin And Related Diarrhea  ?  Tape Rash  ? ? ?HOME MEDICATIONS: ?Current Outpatient Medications  ?Medication Sig Dispense Refill  ? albuterol (VENTOLIN HFA) 108 (90 Base) MCG/ACT inhaler Inhale 2 puffs into the lungs every 6 (six) hours as n

## 2021-09-06 ENCOUNTER — Telehealth: Payer: Medicare Other

## 2021-09-06 DIAGNOSIS — M5416 Radiculopathy, lumbar region: Secondary | ICD-10-CM | POA: Diagnosis not present

## 2021-09-07 ENCOUNTER — Ambulatory Visit (INDEPENDENT_AMBULATORY_CARE_PROVIDER_SITE_OTHER): Payer: Medicare Other | Admitting: Family Medicine

## 2021-09-07 ENCOUNTER — Encounter: Payer: Self-pay | Admitting: Family Medicine

## 2021-09-07 ENCOUNTER — Telehealth: Payer: Medicare Other

## 2021-09-07 VITALS — BP 172/72 | HR 95 | Temp 95.0°F | Ht 69.0 in | Wt 171.6 lb

## 2021-09-07 DIAGNOSIS — N393 Stress incontinence (female) (male): Secondary | ICD-10-CM | POA: Diagnosis not present

## 2021-09-07 DIAGNOSIS — Z4802 Encounter for removal of sutures: Secondary | ICD-10-CM | POA: Diagnosis not present

## 2021-09-07 MED ORDER — OXYBUTYNIN CHLORIDE ER 15 MG PO TB24: 15.0000 mg | ORAL_TABLET | Freq: Every day | ORAL | 2 refills | Status: DC

## 2021-09-07 NOTE — Progress Notes (Signed)
? ?Assessment & Plan:  ?1. Visit for suture removal ?One suture removed as the other is no longer present. I suspect patient popped this out on accident since it was on the bottom of her foot. Patient tolerated well.  ? ?2. Stress incontinence of urine ?Uncontrolled. Ditropan-XL increased from 10 mg to 15 mg daily. DME prescription written for medium size pull-ups.  ?- oxybutynin (DITROPAN XL) 15 MG 24 hr tablet; Take 1 tablet (15 mg total) by mouth at bedtime.  Dispense: 30 tablet; Refill: 2 ? ? ?Follow up plan: Return as scheduled. ? ?[name], MSN, APRN, FNP-C ?[place] ? ?Subjective:  ? ?[name], female    DOB: February 23, 1956, 66 y.o.   MRN: [id number] ? ?HPI: ?Cynthia Deleon is a 66 y.o. female presenting on 09/07/2021 for Suture / Staple Removal ? ?Patient is here for suture removal from her right foot. No complaints or concerns regarding the foot. ? ?She does report she has been having more urinary incontinence episodes over the past month. She is taking Ditropan XL 10 mg daily and reports it was previously working much better. She would also like to see if she can get a prescription for pull-ups (size medium). ? ? ?ROS: Negative unless specifically indicated above in HPI.  ? ?Relevant past medical history reviewed and updated as indicated.  ? ?Allergies and medications reviewed and updated. ? ? ?Current Outpatient Medications:  ?  albuterol (VENTOLIN HFA) 108 (90 Base) MCG/ACT inhaler, Inhale 2 puffs into the lungs every 6 (six) hours as needed., Disp: 18 g, Rfl: 2 ?  atorvastatin (LIPITOR) 40 MG tablet, Take 1 tablet (40 mg total) by mouth daily., Disp: 90 tablet, Rfl: 1 ?  azelastine (ASTELIN) 0.1 % nasal spray, Place 1 spray into both nostrils 2 (two) times daily. Use in each nostril as directed, Disp: 30 mL, Rfl: 12 ?  budesonide-formoterol (SYMBICORT) 160-4.5 MCG/ACT inhaler, Inhale 2 puffs into the lungs in the morning and at bedtime., Disp: 1 each,  Rfl: 6 ?  Continuous Blood Gluc Sensor (FREESTYLE LIBRE 2 SENSOR) MISC, by Does not apply route., Disp: , Rfl:  ?  cyclobenzaprine (FLEXERIL) 10 MG tablet, , Disp: , Rfl:  ?  diazepam (VALIUM) 5 MG tablet, Take 5 mg by mouth in the morning and at bedtime. , Disp: , Rfl:  ?  Dulaglutide (TRULICITY) 3 JK/9.3OI SOPN, Inject 3 mg as directed once a week. DX: E11.65, Disp: 2 mL, Rfl: 4 ?  DULoxetine (CYMBALTA) 60 MG capsule, Take 1 capsule (60 mg total) by mouth daily., Disp: 30 capsule, Rfl: 11 ?  fluticasone (FLONASE) 50 MCG/ACT nasal spray, Place 2 sprays into both nostrils daily., Disp: 16 g, Rfl: 5 ?  glipiZIDE (GLUCOTROL XL) 5 MG 24 hr tablet, Take 1 tablet (5 mg total) by mouth 2 (two) times daily with a meal., Disp: 60 tablet, Rfl: 5 ?  glucose blood (CONTOUR NEXT TEST) test strip, Test BS 4 times daily Dx E11.9, Disp: 400 strip, Rfl: 3 ?  JANUVIA 100 MG tablet, Take 100 mg by mouth daily., Disp: , Rfl:  ?  lamoTRIgine (LAMICTAL) 200 MG tablet, Take 200 mg by mouth at bedtime. For mood disorder., Disp: , Rfl:  ?  lansoprazole (PREVACID) 30 MG capsule, Take 1 capsule (30 mg total) by mouth daily at 12 noon., Disp: 30 capsule, Rfl: 12 ?  lisinopril (ZESTRIL) 5 MG tablet, Take 1 tablet (5 mg total) by mouth daily., Disp: 90 tablet, Rfl:  1 ?  loratadine (CLARITIN) 10 MG tablet, Take 1 tablet (10 mg total) by mouth daily., Disp: 30 tablet, Rfl: 11 ?  meclizine (ANTIVERT) 25 MG tablet, Take 2 tablets (50 mg total) by mouth daily as needed for dizziness., Disp: 60 tablet, Rfl: 2 ?  montelukast (SINGULAIR) 10 MG tablet, TAKE (1) TABLET BY MOUTH AT BEDTIME., Disp: 90 tablet, Rfl: 1 ?  ondansetron (ZOFRAN-ODT) 4 MG disintegrating tablet, DISSOLVE 1 TABLET BY MOUTH EVERY 8 HOURS AS NEEDED FOR NAUSEA OR VOMITING., Disp: 20 tablet, Rfl: 0 ?  OneTouch Delica Lancets 58I MISC, Use to test blood sugar twice daily. DX: E11.9, Disp: 100 each, Rfl: 3 ?  oxybutynin (DITROPAN-XL) 10 MG 24 hr tablet, TAKE (1) TABLET BY MOUTH AT  BEDTIME., Disp: 90 tablet, Rfl: 1 ?  oxyCODONE (ROXICODONE) 15 MG immediate release tablet, Take 7.5 mg by mouth every 6 (six) hours as needed. , Disp: , Rfl:  ?  pregabalin (LYRICA) 150 MG capsule, Take 1 capsule (150 mg total) by mouth 2 (two) times daily., Disp: 180 capsule, Rfl: 1 ?  PREMARIN 0.3 MG tablet, TAKE (1) TABLET BY MOUTH ONCE DAILY., Disp: 30 tablet, Rfl: 5 ?  promethazine (PHENERGAN) 12.5 MG tablet, Take 1 tablet (12.5 mg total) by mouth every 8 (eight) hours as needed for nausea or vomiting., Disp: 20 tablet, Rfl: 0 ?  temazepam (RESTORIL) 15 MG capsule, Take 15 mg by mouth at bedtime., Disp: , Rfl:  ?  traZODone (DESYREL) 100 MG tablet, Take 100 mg by mouth at bedtime., Disp: , Rfl:  ?  valACYclovir (VALTREX) 500 MG tablet, TAKE (1) TABLET BY MOUTH ONCE DAILY AS NEEDED FOR SHINGLES FLARE UP., Disp: 30 tablet, Rfl: 0 ? ?Allergies  ?Allergen Reactions  ? Chantix [Varenicline] Other (See Comments)  ?  Altered mental status-Per patient was put on allergy list by Dr Lorriane Shire bu patient staes she is not allergic to this and is currently taking it.   ? Divalproex Sodium Other (See Comments)  ?  Hallucinations  ?Other reaction(s): Confusion  ? Other Anaphylaxis  ? Pseudoeph-Hydrocodone-Gg Nausea Only  ? Sulfa Antibiotics Anaphylaxis, Nausea Only, Swelling and Other (See Comments)  ?  Swelling of tongue.  ? Varenicline Tartrate Other (See Comments)  ?  Other reaction(s): Confusion  ? Ambien [Zolpidem] Other (See Comments)  ?  Causes sleep walking  ? Clavulanic Acid Diarrhea  ? Codeine Nausea And Vomiting and Other (See Comments)  ? Elemental Sulfur Other (See Comments)  ?  Swelling of tongue  ? Hydrocod Poli-Chlorphe Poli Er Other (See Comments)  ?  Other reaction(s): Skin itches ?  ? Hydrocodone Nausea And Vomiting  ? Macrobid [Nitrofurantoin] Nausea And Vomiting  ?  Dizziness, nausea, vomiting  ? Paxil [Paroxetine Hcl] Other (See Comments)  ?  Causes ringing in the ears.   ? Prednisone Other (See  Comments)  ?  Other reaction(s): Unknown ?  ? Amoxicillin Rash  ?  Has patient had a PCN reaction causing immediate rash, facial/tongue/throat swelling, SOB or lightheadedness with hypotension: No ?Has patient had a PCN reaction causing severe rash involving mucus membranes or skin necrosis: No ?Has patient had a PCN reaction that required hospitalization: No ?Has patient had a PCN reaction occurring within the last 10 years: Yes ?If all of the above answers are "NO", then may proceed with Cephalosporin use. ?  ? [place] [Dapagliflozin]   ?  RECURRENT YEAST/UTI  ? Metformin And Related Diarrhea  ? Tape Rash  ? ? ?  Objective:  ? ?BP (!) 172/72   Pulse 95   Temp (!) 95 ?F (35 ?C) (Temporal)   Ht '5\' 9"'$  (1.753 m)   Wt 171 lb 9.6 oz (77.8 kg)   BMI 25.34 kg/m?   ? ?Physical Exam ?Vitals reviewed.  ?Constitutional:   ?   General: She is not in acute distress. ?   Appearance: Normal appearance. She is not ill-appearing, toxic-appearing or diaphoretic.  ?HENT:  ?   Head: Normocephalic and atraumatic.  ?Eyes:  ?   General: No scleral icterus.    ?   Right eye: No discharge.     ?   Left eye: No discharge.  ?   Conjunctiva/sclera: Conjunctivae normal.  ?Cardiovascular:  ?   Rate and Rhythm: Normal rate.  ?Pulmonary:  ?   Effort: Pulmonary effort is normal. No respiratory distress.  ?Musculoskeletal:     ?   General: Normal range of motion.  ?   Cervical back: Normal range of motion.  ?Skin: ?   General: Skin is warm and dry.  ?   Capillary Refill: Capillary refill takes less than 2 seconds.  ?   Findings: Laceration (under 3rd toe on right foot, well approximated with 1 suture in place. No erythema, swelling, warmth, or drainage) present.  ?Neurological:  ?   General: No focal deficit present.  ?   Mental Status: She is alert and oriented to person, place, and time. Mental status is at baseline.  ?Psychiatric:     ?   Mood and Affect: Mood normal.     ?   Behavior: Behavior normal.     ?   Thought Content: Thought content  normal.     ?   Judgment: Judgment normal.  ? ? ? ? ? ? ?

## 2021-09-12 DIAGNOSIS — M5030 Other cervical disc degeneration, unspecified cervical region: Secondary | ICD-10-CM | POA: Diagnosis not present

## 2021-09-12 DIAGNOSIS — M5412 Radiculopathy, cervical region: Secondary | ICD-10-CM | POA: Diagnosis not present

## 2021-09-12 DIAGNOSIS — M5416 Radiculopathy, lumbar region: Secondary | ICD-10-CM | POA: Diagnosis not present

## 2021-09-12 DIAGNOSIS — M5136 Other intervertebral disc degeneration, lumbar region: Secondary | ICD-10-CM | POA: Diagnosis not present

## 2021-09-12 DIAGNOSIS — G894 Chronic pain syndrome: Secondary | ICD-10-CM | POA: Diagnosis not present

## 2021-09-13 ENCOUNTER — Ambulatory Visit (INDEPENDENT_AMBULATORY_CARE_PROVIDER_SITE_OTHER): Payer: Medicare Other | Admitting: Family Medicine

## 2021-09-13 ENCOUNTER — Encounter: Payer: Self-pay | Admitting: Family Medicine

## 2021-09-13 VITALS — BP 120/84 | HR 86 | Temp 95.5°F | Ht 69.0 in | Wt 171.2 lb

## 2021-09-13 DIAGNOSIS — S63502A Unspecified sprain of left wrist, initial encounter: Secondary | ICD-10-CM | POA: Diagnosis not present

## 2021-09-13 DIAGNOSIS — E1142 Type 2 diabetes mellitus with diabetic polyneuropathy: Secondary | ICD-10-CM

## 2021-09-13 DIAGNOSIS — Z79899 Other long term (current) drug therapy: Secondary | ICD-10-CM

## 2021-09-13 DIAGNOSIS — R3989 Other symptoms and signs involving the genitourinary system: Secondary | ICD-10-CM | POA: Diagnosis not present

## 2021-09-13 DIAGNOSIS — R300 Dysuria: Secondary | ICD-10-CM | POA: Diagnosis not present

## 2021-09-13 LAB — URINALYSIS, ROUTINE W REFLEX MICROSCOPIC
Bilirubin, UA: NEGATIVE
Glucose, UA: NEGATIVE
Ketones, UA: NEGATIVE
Leukocytes,UA: NEGATIVE
Nitrite, UA: NEGATIVE
Protein,UA: NEGATIVE
RBC, UA: NEGATIVE
Specific Gravity, UA: 1.02 (ref 1.005–1.030)
Urobilinogen, Ur: 0.2 mg/dL (ref 0.2–1.0)
pH, UA: 6 (ref 5.0–7.5)

## 2021-09-13 MED ORDER — FLUCONAZOLE 150 MG PO TABS: 150.0000 mg | ORAL_TABLET | Freq: Once | ORAL | 0 refills | Status: AC

## 2021-09-13 MED ORDER — PREGABALIN 150 MG PO CAPS: 150.0000 mg | ORAL_CAPSULE | Freq: Three times a day (TID) | ORAL | 2 refills | Status: DC

## 2021-09-13 MED ORDER — CEFDINIR 300 MG PO CAPS: 300.0000 mg | ORAL_CAPSULE | Freq: Two times a day (BID) | ORAL | 0 refills | Status: AC

## 2021-09-13 MED ORDER — DICLOFENAC SODIUM 75 MG PO TBEC: 75.0000 mg | DELAYED_RELEASE_TABLET | Freq: Two times a day (BID) | ORAL | 0 refills | Status: DC

## 2021-09-13 NOTE — Progress Notes (Signed)
? ?Assessment & Plan:  ?1. Suspected UTI ?Suspect urinalysis negative due to OTC medication to treat infection. Sending urine off to be cultured. Starting antibiotics. ?- Urine Culture ?- cefdinir (OMNICEF) 300 MG capsule; Take 1 capsule (300 mg total) by mouth 2 (two) times daily for 7 days.  Dispense: 14 capsule; Refill: 0 ?- fluconazole (DIFLUCAN) 150 MG tablet; Take 1 tablet (150 mg total) by mouth once for 1 dose. May repeat after 3 days if needed.  Dispense: 2 tablet; Refill: 0 ? ?2. Dysuria ?- Urinalysis, Routine w reflex microscopic ? ?3. Sprain of left wrist, initial encounter ?Education provided on wrist sprain. Encouraged use of NSAID, ice, and compression. Wrapped with ACE-bandage in the office. ?- diclofenac (VOLTAREN) 75 MG EC tablet; Take 1 tablet (75 mg total) by mouth 2 (two) times daily.  Dispense: 30 tablet; Refill: 0 ? ?4-5. Polyneuropathy due to type 2 diabetes mellitus (HCC)/Controlled substance agreement signed ?Improved with Lyrica. We will try TID vs BID to see if it gives more relief. If not, we will go back down to BID. We did discuss doses >300 mg are rarely more effective. ?- pregabalin (LYRICA) 150 MG capsule; Take 1 capsule (150 mg total) by mouth 3 (three) times daily.  Dispense: 90 capsule; Refill: 2 ? ? ?Return within the next few weeks, for follow-up of chronic medication conditions and reschedule AWV as we did not do this today. ? ?[name], MSN, APRN, FNP-C ?[place] ? ?Subjective:  ? ? [name], female    DOB: Jul 18, 1955, 66 y.o.   MRN: [id number] ? ?Patient Care Team: ?Loman Brooklyn, FNP as PCP - General (Family Medicine) ?Suella Broad, MD as Consulting Physician (Physical Medicine and Rehabilitation) ?Norma Fredrickson, MD as Consulting Physician (Psychiatry) ?Ilean China, RN as Equities trader ?Phillips Odor, MD as Consulting Physician (Neurology) ?Lavera Guise, [facility] (Pharmacist) ?Starlyn Skeans as  Librarian, academic (Dermatology) ?Madelin Headings, DO (Optometry)  ? ?Chief Complaint:  ?Chief Complaint  ?Patient presents with  ? Urinary Urgency  ? Dysuria  ?   ?X 2 days   ? hand swelling  ?  Left hand swelling started yesterday after grabbing the wall when she was dizzy.   ? ? ?HPI: ?Cynthia Deleon is a 66 y.o. female presenting on 09/13/2021 for Urinary Urgency, Dysuria (/X 2 days ), and hand swelling (Left hand swelling started yesterday after grabbing the wall when she was dizzy. ) ? ?Patient complains of dysuria and frequency. She has had symptoms for 2 days. Patient also complains of  suprapubic pressure . Patient denies fever. Patient does have a history of recurrent UTI.  Patient does not have a history of pyelonephritis. She has been drinking a lot of water and taking OTC medication to treat her symptoms. ? ?Patient also reports left hand swelling that started yesterday. She felt dizzy when she leaned and grabbed the wall with her left hand. ? ?Patient is requesting an increase in her Lyrica that she takes for peripheral neuropathy. She states the medication works great, but feels it could be better. ? ? ?Social history: ? ?Relevant past medical, surgical, family and social history reviewed and updated as indicated. Interim medical history since our last visit reviewed. ? ?Allergies and medications reviewed and updated. ? ?DATA REVIEWED: CHART IN EPIC ? ?ROS: Negative unless specifically indicated above in HPI.  ? ? ?Current Outpatient Medications:  ?  albuterol (VENTOLIN HFA) 108 (90 Base) MCG/ACT inhaler, Inhale  2 puffs into the lungs every 6 (six) hours as needed., Disp: 18 g, Rfl: 2 ?  atorvastatin (LIPITOR) 40 MG tablet, Take 1 tablet (40 mg total) by mouth daily., Disp: 90 tablet, Rfl: 1 ?  azelastine (ASTELIN) 0.1 % nasal spray, Place 1 spray into both nostrils 2 (two) times daily. Use in each nostril as directed, Disp: 30 mL, Rfl: 12 ?  budesonide-formoterol (SYMBICORT) 160-4.5 MCG/ACT inhaler,  Inhale 2 puffs into the lungs in the morning and at bedtime., Disp: 1 each, Rfl: 6 ?  Continuous Blood Gluc Sensor (FREESTYLE LIBRE 2 SENSOR) MISC, by Does not apply route., Disp: , Rfl:  ?  cyclobenzaprine (FLEXERIL) 10 MG tablet, , Disp: , Rfl:  ?  diazepam (VALIUM) 5 MG tablet, Take 5 mg by mouth in the morning and at bedtime. , Disp: , Rfl:  ?  Dulaglutide (TRULICITY) 3 SA/6.3KZ SOPN, Inject 3 mg as directed once a week. DX: E11.65, Disp: 2 mL, Rfl: 4 ?  DULoxetine (CYMBALTA) 60 MG capsule, Take 1 capsule (60 mg total) by mouth daily., Disp: 30 capsule, Rfl: 11 ?  fluticasone (FLONASE) 50 MCG/ACT nasal spray, Place 2 sprays into both nostrils daily., Disp: 16 g, Rfl: 5 ?  glipiZIDE (GLUCOTROL XL) 5 MG 24 hr tablet, Take 1 tablet (5 mg total) by mouth 2 (two) times daily with a meal., Disp: 60 tablet, Rfl: 5 ?  glucose blood (CONTOUR NEXT TEST) test strip, Test BS 4 times daily Dx E11.9, Disp: 400 strip, Rfl: 3 ?  JANUVIA 100 MG tablet, Take 100 mg by mouth daily., Disp: , Rfl:  ?  lamoTRIgine (LAMICTAL) 200 MG tablet, Take 200 mg by mouth at bedtime. For mood disorder., Disp: , Rfl:  ?  lansoprazole (PREVACID) 30 MG capsule, Take 1 capsule (30 mg total) by mouth daily at 12 noon., Disp: 30 capsule, Rfl: 12 ?  lisinopril (ZESTRIL) 5 MG tablet, Take 1 tablet (5 mg total) by mouth daily., Disp: 90 tablet, Rfl: 1 ?  loratadine (CLARITIN) 10 MG tablet, Take 1 tablet (10 mg total) by mouth daily., Disp: 30 tablet, Rfl: 11 ?  meclizine (ANTIVERT) 25 MG tablet, Take 2 tablets (50 mg total) by mouth daily as needed for dizziness., Disp: 60 tablet, Rfl: 2 ?  montelukast (SINGULAIR) 10 MG tablet, TAKE (1) TABLET BY MOUTH AT BEDTIME., Disp: 90 tablet, Rfl: 1 ?  ondansetron (ZOFRAN-ODT) 4 MG disintegrating tablet, DISSOLVE 1 TABLET BY MOUTH EVERY 8 HOURS AS NEEDED FOR NAUSEA OR VOMITING., Disp: 20 tablet, Rfl: 0 ?  OneTouch Delica Lancets 60F MISC, Use to test blood sugar twice daily. DX: E11.9, Disp: 100 each, Rfl: 3 ?   oxybutynin (DITROPAN XL) 15 MG 24 hr tablet, Take 1 tablet (15 mg total) by mouth at bedtime., Disp: 30 tablet, Rfl: 2 ?  oxyCODONE (ROXICODONE) 15 MG immediate release tablet, Take 7.5 mg by mouth every 6 (six) hours as needed. , Disp: , Rfl:  ?  pregabalin (LYRICA) 150 MG capsule, Take 1 capsule (150 mg total) by mouth 2 (two) times daily., Disp: 180 capsule, Rfl: 1 ?  PREMARIN 0.3 MG tablet, TAKE (1) TABLET BY MOUTH ONCE DAILY., Disp: 30 tablet, Rfl: 5 ?  promethazine (PHENERGAN) 12.5 MG tablet, Take 1 tablet (12.5 mg total) by mouth every 8 (eight) hours as needed for nausea or vomiting., Disp: 20 tablet, Rfl: 0 ?  temazepam (RESTORIL) 15 MG capsule, Take 15 mg by mouth at bedtime., Disp: , Rfl:  ?  traZODone (DESYREL)  100 MG tablet, Take 100 mg by mouth at bedtime., Disp: , Rfl:  ?  valACYclovir (VALTREX) 500 MG tablet, TAKE (1) TABLET BY MOUTH ONCE DAILY AS NEEDED FOR SHINGLES FLARE UP., Disp: 30 tablet, Rfl: 0  ? ?Allergies  ?Allergen Reactions  ? Chantix [Varenicline] Other (See Comments)  ?  Altered mental status-Per patient was put on allergy list by Dr Lorriane Shire bu patient staes she is not allergic to this and is currently taking it.   ? Divalproex Sodium Other (See Comments)  ?  Hallucinations  ?Other reaction(s): Confusion  ? Other Anaphylaxis  ? Pseudoeph-Hydrocodone-Gg Nausea Only  ? Sulfa Antibiotics Anaphylaxis, Nausea Only, Swelling and Other (See Comments)  ?  Swelling of tongue.  ? Varenicline Tartrate Other (See Comments)  ?  Other reaction(s): Confusion  ? Ambien [Zolpidem] Other (See Comments)  ?  Causes sleep walking  ? Clavulanic Acid Diarrhea  ? Codeine Nausea And Vomiting and Other (See Comments)  ? Elemental Sulfur Other (See Comments)  ?  Swelling of tongue  ? Hydrocod Poli-Chlorphe Poli Er Other (See Comments)  ?  Other reaction(s): Skin itches ?  ? Hydrocodone Nausea And Vomiting  ? Macrobid [Nitrofurantoin] Nausea And Vomiting  ?  Dizziness, nausea, vomiting  ? Paxil [Paroxetine Hcl]  Other (See Comments)  ?  Causes ringing in the ears.   ? Prednisone Other (See Comments)  ?  Other reaction(s): Unknown ?  ? Amoxicillin Rash  ?  Has patient had a PCN reaction causing immediate rash, facial/to

## 2021-09-13 NOTE — Patient Instructions (Signed)
Ice

## 2021-09-17 ENCOUNTER — Other Ambulatory Visit: Payer: Self-pay | Admitting: Family Medicine

## 2021-09-17 DIAGNOSIS — B379 Candidiasis, unspecified: Secondary | ICD-10-CM

## 2021-09-17 DIAGNOSIS — N3000 Acute cystitis without hematuria: Secondary | ICD-10-CM

## 2021-09-17 LAB — URINE CULTURE

## 2021-09-17 MED ORDER — FLUCONAZOLE 150 MG PO TABS: 150.0000 mg | ORAL_TABLET | Freq: Once | ORAL | 0 refills | Status: AC

## 2021-09-17 MED ORDER — CIPROFLOXACIN HCL 500 MG PO TABS: 500.0000 mg | ORAL_TABLET | Freq: Two times a day (BID) | ORAL | 0 refills | Status: AC

## 2021-09-25 DIAGNOSIS — G4733 Obstructive sleep apnea (adult) (pediatric): Secondary | ICD-10-CM | POA: Diagnosis not present

## 2021-09-26 ENCOUNTER — Encounter: Payer: Self-pay | Admitting: Pulmonary Disease

## 2021-09-28 ENCOUNTER — Telehealth: Payer: Self-pay | Admitting: Pharmacist

## 2021-09-28 ENCOUNTER — Ambulatory Visit (INDEPENDENT_AMBULATORY_CARE_PROVIDER_SITE_OTHER): Payer: Medicare Other | Admitting: Pharmacist

## 2021-09-28 DIAGNOSIS — E11649 Type 2 diabetes mellitus with hypoglycemia without coma: Secondary | ICD-10-CM

## 2021-09-28 DIAGNOSIS — E1142 Type 2 diabetes mellitus with diabetic polyneuropathy: Secondary | ICD-10-CM

## 2021-09-28 DIAGNOSIS — I10 Essential (primary) hypertension: Secondary | ICD-10-CM

## 2021-09-28 NOTE — Telephone Encounter (Signed)
Referral placed.  Please make patient aware she should hear something within the next week and if she does not she needs to reach out to [place] and let us know. ?

## 2021-09-28 NOTE — Telephone Encounter (Signed)
Pt aware by detailed VM 

## 2021-09-28 NOTE — Progress Notes (Signed)
? ? ?Chronic Care Management ?Pharmacy Note ? ?09/28/2021 ?Name:  Cynthia Deleon MRN:  [id number] DOB:  01-18-1956 ? ?Summary: ? ?Diabetes: ?Controlled-A1C 7.4%-BLOOD SUGARS ARE within goal range!; current treatment: GLIPIZIDE, TRULICITY 17m SQ WEEKLY TODAY; JANUVIA ?INTOLERANCES ?METFORMIN (DUE TO GI) ?DISCONTINUED FARXIGA DUE TO RECURRENT UTI/YEAST INFECTIONS DESPITE CONTROLLED SUGARS ?D/C'D OZEMPIC DUE TO GI SIDE EFFECTS (TOLERATES TRULICITY) ?INCREASED TRULICITY TO 3MG WEEKLY ?Denies personal and family history of Medullary thyroid cancer (MTC) ?PATIENT HAS BEEN CONTINUING TO TAKE JANUVIA DESPITE COUNSELING TO STOP ?SHE DENIES ADDITIONAL SIDE EFFECTS AND REPORTS JANUVIA IS HELPING HER ?CONTINUE GLIPIZIDE  ?Current glucose readings: fasting glucose: <130, post prandial glucose: <200 MUCH IMPROVED ?Patient [place] Libre 2 [place]?Extensive education provided on FS libre 2 CGM system; pt able to demonstrated and teachback ?INSTRUCTED HOW TO ORDER REFILLS via ADV DM supply ?Telephone: [phone]?Denies hypoglycemic/hyperglycemic symptoms ?Discussed meal planning options and Plate method for healthy eating--Patient admits to not eating wisely, she is trying to work on this ?Avoid sugary drinks and desserts ?Incorporate balanced protein, non starchy veggies, 1 serving of carbohydrate with each meal ?Increase water intake ?Increase physical activity as able ?Current exercise: active job  ?Educated on current medications, [place]?Recommended SWITCH BACK TO TRULICITY, INCREASE GLIPIZIDE, LIBRE ?Assisted patient with [facility] -- will be mailed to patient's house ?Needs referral to podiatry--prefers dr. [place]ving [place]ows[place]? ?Subjective: ?Cynthia TANGNEYis an 66y.o. year old female who is a primary patient of JLoman Brooklyn FNP.  The CCM team was consulted for assistance with disease management and care  coordination needs.   ? ?Engaged with patient by telephone for follow up visit in response to provider referral for pharmacy case management and/or care coordination services.  ? ?Consent to Services:  ?The patient was given information about Chronic Care Management services, agreed to services, and gave verbal consent prior to initiation of services.  Please see initial visit note for detailed documentation.  ? ?Patient Care Team: ?JLoman Brooklyn FNP as PCP - General (Family Medicine) ?RSuella Broad MD as Consulting Physician (Physical Medicine and Rehabilitation) ?PNorma Fredrickson MD as Consulting Physician (Psychiatry) ?HIlean China RN as REquities trader?DPhillips Odor MD as Consulting Physician (Neurology) ?[facility]avera Guise [facility](Pharmacist) ?SStarlyn Skeansas PLibrarian, academic(Dermatology) ?CMadelin Headings DO (Optometry) ? ?Objective: ? ?Lab Results  ?Component Value Date  ? CREATININE 0.79 03/29/2021  ? CREATININE 0.84 10/10/2020  ? CREATININE 0.72 06/08/2020  ? ? ?Lab Results  ?Component Value Date  ? HGBA1C 7.4 (H) 03/08/2021  ? ?Last diabetic Eye exam:  ?Lab Results  ?Component Value Date/Time  ? HMDIABEYEEXA No Retinopathy 07/26/2021 12:00 AM  ?  ?Last diabetic Foot exam: No results found for: HMDIABFOOTEX  ? ?   ?Component Value Date/Time  ? CHOL 169 03/29/2021 1237  ? TRIG 118 03/29/2021 1237  ? HDL 52 03/29/2021 1237  ? CHOLHDL 3.3 03/29/2021 1237  ? CHOLHDL 4.1 11/16/2015 0715  ? VLDL 48 (H) 11/16/2015 0715  ? LPutney96 03/29/2021 1237  ? ? ? ?  Latest Ref Rng & Units 03/29/2021  ? 12:37 PM 10/10/2020  ?  9:54 AM 03/07/2020  ?  8:52 AM  ?Hepatic Function  ?Total Protein 6.0 - 8.5 g/dL 6.4   6.7   6.3    ?Albumin 3.8 - 4.8 g/dL 4.6   5.1   4.6    ?AST  0 - 40 IU/L '20   18   11    ' ?ALT 0 - 32 IU/L '13   13   7    ' ?Alk Phosphatase 44 - 121 IU/L 66   55   68    ?Total Bilirubin 0.0 - 1.2 mg/dL 0.6   0.7   0.4    ? ? ?Lab Results  ?Component Value Date/Time  ? TSH 1.220 07/04/2020 09:58 AM   ? TSH 1.060 03/07/2020 08:52 AM  ? ? ? ?  Latest Ref Rng & Units 03/29/2021  ? 12:37 PM 10/10/2020  ?  9:54 AM 06/08/2020  ?  8:47 AM  ?CBC  ?WBC 3.4 - 10.8 x10E3/uL 8.2   6.5   11.0    ?Hemoglobin 11.1 - 15.9 g/dL 16.2   15.5   15.8    ?Hematocrit 34.0 - 46.6 % 48.1   44.5   45.7    ?Platelets 150 - 450 x10E3/uL 204   267   201    ? ? ?No results found for: VD25OH ? ?Clinical ASCVD: No  ?The 10-year ASCVD risk score (Arnett DK, et al., 2019) is: 11.6% ?  Values used to calculate the score: ?    Age: 61 years ?    Sex: Female ?    Is Non-Hispanic African American: No ?    Diabetic: Yes ?    Tobacco smoker: No ?    Systolic Blood Pressure: 947 mmHg ?    Is BP treated: Yes ?    HDL Cholesterol: 52 mg/dL ?    Total Cholesterol: 169 mg/dL   ? ?Other: (CHADS2VASc if Afib, PHQ9 if depression, MMRC or CAT for COPD, ACT, DEXA) ? ?Social History  ? ?Tobacco Use  ?Smoking Status Former  ? Packs/day: 0.50  ? Years: 35.00  ? Pack years: 17.50  ? Types: Cigarettes  ? Quit date: 05/11/2019  ? Years since quitting: 2.3  ?Smokeless Tobacco Never  ? ?BP Readings from Last 3 Encounters:  ?09/13/21 120/84  ?09/07/21 (!) 172/72  ?09/03/21 (!) 145/83  ? ?Pulse Readings from Last 3 Encounters:  ?09/13/21 86  ?09/07/21 95  ?09/03/21 92  ? ?Wt Readings from Last 3 Encounters:  ?09/13/21 171 lb 3.2 oz (77.7 kg)  ?09/07/21 171 lb 9.6 oz (77.8 kg)  ?09/03/21 175 lb (79.4 kg)  ? ? ?Assessment: Review of patient past medical history, allergies, medications, health status, including review of consultants reports, laboratory and other test data, was performed as part of comprehensive evaluation and provision of chronic care management services.  ? ?SDOH:  (Social Determinants of Health) assessments and interventions performed:  ? ? ?CCM Care Plan ? ?Allergies  ?Allergen Reactions  ? Chantix [Varenicline] Other (See Comments)  ?  Altered mental status-Per patient was put on allergy list by Dr Lorriane Shire bu patient staes she is not allergic to this  and is currently taking it.   ? Divalproex Sodium Other (See Comments)  ?  Hallucinations  ?Other reaction(s): Confusion  ? Other Anaphylaxis  ? Pseudoeph-Hydrocodone-Gg Nausea Only  ? Sulfa Antibiotics Anaphylaxis, Nausea Only, Swelling and Other (See Comments)  ?  Swelling of tongue.  ? Varenicline Tartrate Other (See Comments)  ?  Other reaction(s): Confusion  ? Ambien [Zolpidem] Other (See Comments)  ?  Causes sleep walking  ? Clavulanic Acid Diarrhea  ? Codeine Nausea And Vomiting and Other (See Comments)  ? Elemental Sulfur Other (See Comments)  ?  Swelling of tongue  ? Hydrocod  Poli-Chlorphe Poli Er Other (See Comments)  ?  Other reaction(s): Skin itches ?  ? Hydrocodone Nausea And Vomiting  ? Macrobid [Nitrofurantoin] Nausea And Vomiting  ?  Dizziness, nausea, vomiting  ? Paxil [Paroxetine Hcl] Other (See Comments)  ?  Causes ringing in the ears.   ? Prednisone Other (See Comments)  ?  Other reaction(s): Unknown ?  ? Amoxicillin Rash  ?  Has patient had a PCN reaction causing immediate rash, facial/tongue/throat swelling, SOB or lightheadedness with hypotension: No ?Has patient had a PCN reaction causing severe rash involving mucus membranes or skin necrosis: No ?Has patient had a PCN reaction that required hospitalization: No ?Has patient had a PCN reaction occurring within the last 10 years: Yes ?If all of the above answers are "NO", then may proceed with Cephalosporin use. ?  ? [place] [Dapagliflozin]   ?  RECURRENT YEAST/UTI  ? Metformin And Related Diarrhea  ? Tape Rash  ? ? ?Medications Reviewed Today   ? ? Reviewed by Lavera Guise, [facility] (Pharmacist) on 09/28/21 at [phone]  Med List Status: <None>  ? ?Medication Order Taking? Sig Documenting Provider Last Dose Status Informant  ?albuterol (VENTOLIN HFA) 108 (90 Base) MCG/ACT inhaler [id number] No Inhale 2 puffs into the lungs every 6 (six) hours as needed. Loman Brooklyn, FNP Taking Active   ?atorvastatin (LIPITOR) 40 MG tablet [id number] No Take 1  tablet (40 mg total) by mouth daily. Loman Brooklyn, FNP Taking Active   ?azelastine (ASTELIN) 0.1 % nasal spray [id number] No Place 1 spray into both nostrils 2 (two) times daily. Use in each nostril as directed So

## 2021-09-28 NOTE — Patient Instructions (Signed)
Visit Information ? ?Following are the goals we discussed today:  ?Current Barriers:  ?Unable to independently afford treatment regimen ?Unable to achieve control of T2DM  ? ?Pharmacist Clinical Goal(s):  ?Over the next 90 days, patient will verbalize ability to afford treatment regimen ?achieve control of T2DM as evidenced by IMPROVED GLYCEMIC CONTROL through collaboration with PharmD and provider.  ? ?Interventions: ?1:1 collaboration with Loman Brooklyn, FNP regarding development and update of comprehensive plan of care as evidenced by provider attestation and co-signature ?Inter-disciplinary care team collaboration (see longitudinal plan of care) ?Comprehensive medication review performed; medication list updated in electronic medical record ? ?Diabetes: ?Uncontrolled-A1C 7.4%-BLOOD SUGARS ARE within goal range!; current treatment: GLIPIZIDE, TRULICITY '3mg'$  SQ WEEKLY TODAY; JANUVIA ?INTOLERANCES ?METFORMIN (DUE TO GI) ?DISCONTINUED FARXIGA DUE TO RECURRENT UTI/YEAST INFECTIONS DESPITE CONTROLLED SUGARS ?D/C'D OZEMPIC DUE TO GI SIDE EFFECTS (TOLERATES TRULICITY) ?INCREASED TRULICITY TO '3MG'$  WEEKLY ?Denies personal and family history of Medullary thyroid cancer (MTC) ?PATIENT HAS BEEN CONTINUING TO TAKE JANUVIA DESPITE COUNSELING TO STOP ?SHE DENIES ADDITIONAL SIDE EFFECTS AND REPORTS JANUVIA IS HELPING HER ?CONTINUE GLIPIZIDE  ?Current glucose readings: fasting glucose: <130, post prandial glucose: <200 MUCH IMPROVED ?Patient received her Libre 2 in the [place] ?Extensive education provided on FS libre 2 CGM system; pt able to demonstrated and teachback ?INSTRUCTED HOW TO ORDER REFILLS via ADV DM supply ?Telephone: [phone] ?Denies hypoglycemic/hyperglycemic symptoms ?Discussed meal planning options and Plate method for healthy eating--Patient admits to not eating wisely, she is trying to work on this ?Avoid sugary drinks and desserts ?Incorporate balanced protein, non  starchy veggies, 1 serving of carbohydrate with each meal ?Increase water intake ?Increase physical activity as able ?Current exercise: active job  ?Educated on current medications, patient on pill packaging with [place] ?Recommended SWITCH BACK TO TRULICITY, INCREASE GLIPIZIDE, LIBRE ?Assisted patient with [facility] -- will be mailed to patient's house ?Needs referral to podiatry--prefers dr. Irving Shows in [place] ? ?Discussed Blood pressure and cholesterol--stable, continue current medications as prescribed, patient using pill packs monthly ?May titrate/switch statin if LDL not at goal at f/u-->would prefer rosuvastatin vs atorva (pt less likely to have myopathy which she sometimes reports) ? ?Patient Goals/Self-Care Activities ?Over the next 90 days, patient will:  ?- take medications as prescribed ?check glucose daily (fasting) or if symptomatic, document, and provide at future appointments ? ?Follow Up Plan: Telephone follow up appointment with care management team member scheduled for: 1 month ? ? ?Plan: Telephone follow up appointment with care management team member scheduled for:  1 month ? ?Signature ?Regina Eck, PharmD, BCPS ?Clinical Pharmacist, [facility] ?[place]  [place] Phone [phone] ? ? ?Please call the care guide team at [phone] if you need to cancel or reschedule your appointment.  ? ?The patient verbalized understanding of instructions, educational materials, and care plan provided today and declined offer to receive copy of patient instructions, educational materials, and care plan.  ? ?

## 2021-09-28 NOTE — Telephone Encounter (Signed)
T2DM patient requesting podiatry referral to see dr Irving Shows in [place] ? ?Routing to PCP ?

## 2021-10-01 ENCOUNTER — Telehealth: Payer: Self-pay | Admitting: *Deleted

## 2021-10-01 DIAGNOSIS — I739 Peripheral vascular disease, unspecified: Secondary | ICD-10-CM

## 2021-10-01 NOTE — Telephone Encounter (Signed)
Please offer patient a referral to vascular. Place referral if agreeable to location of her choosing. ?

## 2021-10-01 NOTE — Telephone Encounter (Signed)
TC from [place], NP w/ [facility] ?She did a PAD on pt today ?Right was 0.94 Left was 0.20 ?Will we send pt something official ?

## 2021-10-02 ENCOUNTER — Ambulatory Visit: Payer: Medicare Other | Admitting: Family Medicine

## 2021-10-02 ENCOUNTER — Telehealth: Payer: Self-pay | Admitting: Pulmonary Disease

## 2021-10-02 ENCOUNTER — Ambulatory Visit (INDEPENDENT_AMBULATORY_CARE_PROVIDER_SITE_OTHER): Payer: Medicare Other | Admitting: Pulmonary Disease

## 2021-10-02 ENCOUNTER — Telehealth: Payer: Self-pay

## 2021-10-02 ENCOUNTER — Encounter: Payer: Self-pay | Admitting: Pulmonary Disease

## 2021-10-02 VITALS — BP 128/88 | HR 84 | Temp 98.3°F | Ht 69.0 in | Wt 173.2 lb

## 2021-10-02 DIAGNOSIS — Z720 Tobacco use: Secondary | ICD-10-CM

## 2021-10-02 DIAGNOSIS — Z87891 Personal history of nicotine dependence: Secondary | ICD-10-CM

## 2021-10-02 DIAGNOSIS — G4733 Obstructive sleep apnea (adult) (pediatric): Secondary | ICD-10-CM

## 2021-10-02 DIAGNOSIS — G4734 Idiopathic sleep related nonobstructive alveolar hypoventilation: Secondary | ICD-10-CM

## 2021-10-02 NOTE — Progress Notes (Signed)
? ?[facility] ? ?Chief Complaint  ?Patient presents with  ? Follow-up  ?  Patient would like to discuss lung cancer screening.  ?SOB same. CPAP working well   ? ? ?Constitutional:  ?BP 128/88 (BP Location: Left Arm, Patient Position: Sitting)   Pulse 84   Temp 98.3 ?F (36.8 ?C) (Temporal)   Ht '5\' 9"'$  (1.753 m)   Wt 173 lb 3.2 oz (78.6 kg)   SpO2 96% Comment: ra  BMI 25.58 kg/m?  ? ?Past Medical History:  ?OA, Bipolar, DM, Back pain, Depression, Diplopia, GERD, Headache, HTN, IBS, Nephrolithiasis, Multiple personality disorder, Panic attacks, PUD, Neuropathy, Shingles, Rosacea ? ?Past Surgical History:  ?She  has a past surgical history that includes Tonsillectomy; Foot arthrodesis (2000); Bunionectomy with hammertoe reconstruction (Right, 12/10/2012); Rectal surgery; Liposuction (03/2018); Colonoscopy with propofol (N/A, 04/27/2020); biopsy (04/27/2020); and Total abdominal hysterectomy. ? ?Brief Summary:  ?Cynthia Deleon is a 66 y.o. female former smoker with allergic asthma, emphysema and obstructive sleep apnea.  ?  ? ? ? ?Subjective:  ? ?She got her new CPAP machine.  This is working well.  No issues with mask fit.  Needs to get set up to use oxygen with CPAP at night.  ONO showed low oxygen at night still with CPAP if she isn't using oxygen.  CPAP titration showed good control of oxygen and apnea with CPAP 6 and 2 liters O2. ? ?Has occasional cough.  Not having wheeze or chest congestion.  Using nose spray for allergies. ? ?She wants to learn more about lung cancer screening program.  She started smoking as a teenager.  She smoked 1 pack per day.  She quit in 2019. ? ?Physical Exam:  ? ?Appearance - well kempt  ? ?ENMT - no sinus tenderness, no oral exudate, no LAN, Mallampati 3 airway, no stridor ? ?Respiratory - equal breath sounds bilaterally, no wheezing or rales ? ?CV - s1s2 regular rate and rhythm, no murmurs ? ?Ext - no clubbing, no edema ? ?Skin - no  rashes ? ?Psych - normal mood and affect ? ? ? ?  ?Pulmonary testing:  ?PFT 05/13/19 >> FEV1 2.06 (69%), FEV1% 78, TLC 4.56 (78%), DLCO 97%, +BD ? ?Chest Imaging:  ?LDCT chest 04/20/19 >> mild centrilobular emphysema, atherosclerosis, 3 mm nodule RML, fatty liver ? ?Sleep Tests:  ?PSG 04/30/21 >> AHI 6.5, SpO2 low 84%; spent 162.5 min with an SpO2 < 88% ?CPAP titration 08/13/21 >> CPAP with 2 liters O2 >> AHI 0 ?ONO with CPAP 09/25/21 >> test time 10 hrs 2 min.  Mean SpO2 88%, low SpO2 81%.  Spent 4 hrs with SpO2 < 88% ?Auto CPAP 09/25/21 to 09/30/21 >> used on 6 of 6 nights with average 8 hrs 51 min.  Average AHI 4 with median CPAP 7 and 95 th percentile CPAP 9 cm H2O ? ?Cardiac Tests:  ?Echo 02/17/18 >> EF 55 to 60%, grade 1 DD ? ?Social History:  ?She  reports that she quit smoking about 4 years ago. Her smoking use included cigarettes. She has a 42.00 pack-year smoking history. She has never used smokeless tobacco. She reports that she does not drink alcohol and does not use drugs. ? ?Family History:  ?Her family history includes Alcohol abuse in her brother, brother, brother, and brother; COPD in her brother, father, and sister; Diabetes in her maternal grandfather, maternal grandmother, mother, and sister; Hyperlipidemia in her sister; Other in her brother, mother, and sister. ?  ? ? ?  Assessment/Plan:  ? ?Allergic asthma and emphysema. ?- continue singulair ?- prn albuterol ? ?Perennial allergic rhinitis. ?- continue flonase, singulair, claritin ?- prn azelastine ? ?History of tobacco abuse. ?- she start smoking as a teenager, smoked 1 ppd, and quit in 2019 ?- will arrange for referral to [facility] to review the lung cancer screening program ? ?Obstructive sleep apnea and nocturnal hypoxemia. ?- she is compliant with CPAP and reports benefit from therapy ?- she uses [facility] for her DME ?- she is to continue auto CPAP 5 to 15 cm H2O and will need to get set up with 2 liters oxygen at night with  CPAP ? ?Recurrent episodes of thrush and vaginal candidiasis. ?- she usually needs a course of diflucan when she takes antibiotics ? ?Time Spent Involved in Patient Care on Day of Examination:  ?37 minutes ? ?Follow up:  ? ?Patient Instructions  ?Will make sure you have set up to use 2 liters oxygen at night with CPAP ? ?Will arrange for appointment with Eric Form to discuss the lung cancer screening program ? ?Follow up in 6 months ? ?Medication List:  ? ?Allergies as of 10/02/2021   ? ?   Reactions  ? Chantix [varenicline] Other (See Comments)  ? Altered mental status-Per patient was put on allergy list by Dr Lorriane Shire bu patient staes she is not allergic to this and is currently taking it.   ? Divalproex Sodium Other (See Comments)  ? Hallucinations  ?Other reaction(s): Confusion  ? Other Anaphylaxis  ? Pseudoeph-hydrocodone-gg Nausea Only  ? Sulfa Antibiotics Anaphylaxis, Nausea Only, Swelling, Other (See Comments)  ? Swelling of tongue.  ? Varenicline Tartrate Other (See Comments)  ? Other reaction(s): Confusion  ? Ambien [zolpidem] Other (See Comments)  ? Causes sleep walking  ? Clavulanic Acid Diarrhea  ? Codeine Nausea And Vomiting, Other (See Comments)  ? Elemental Sulfur Other (See Comments)  ? Swelling of tongue  ? Hydrocod Poli-chlorphe Poli Er Other (See Comments)  ? Other reaction(s): Skin itches  ? Hydrocodone Nausea And Vomiting  ? Macrobid [nitrofurantoin] Nausea And Vomiting  ? Dizziness, nausea, vomiting  ? Paxil [paroxetine Hcl] Other (See Comments)  ? Causes ringing in the ears.   ? Prednisone Other (See Comments)  ? Other reaction(s): Unknown  ? Amoxicillin Rash  ? Has patient had a PCN reaction causing immediate rash, facial/tongue/throat swelling, SOB or lightheadedness with hypotension: No ?Has patient had a PCN reaction causing severe rash involving mucus membranes or skin necrosis: No ?Has patient had a PCN reaction that required hospitalization: No ?Has patient had a PCN reaction  occurring within the last 10 years: Yes ?If all of the above answers are "NO", then may proceed with Cephalosporin use.  ? [place] [dapagliflozin]   ? RECURRENT YEAST/UTI  ? Metformin And Related Diarrhea  ? Tape Rash  ? ?  ? ?  ?Medication List  ?  ? ?  ? Accurate as of October 02, 2021 12:29 PM. If you have any questions, ask your nurse or doctor.  ?  ?  ? ?  ? ?STOP taking these medications   ? ?budesonide-formoterol 160-4.5 MCG/ACT inhaler ?Commonly known as: Symbicort ?Stopped by: Chesley Mires, MD ?  ? ?  ? ?TAKE these medications   ? ?albuterol 108 (90 Base) MCG/ACT inhaler ?Commonly known as: VENTOLIN HFA ?Inhale 2 puffs into the lungs every 6 (six) hours as needed. ?  ?atorvastatin 40 MG tablet ?Commonly known as: LIPITOR ?Take 1  tablet (40 mg total) by mouth daily. ?  ?azelastine 0.1 % nasal spray ?Commonly known as: ASTELIN ?Place 1 spray into both nostrils 2 (two) times daily. Use in each nostril as directed ?  ?Contour Next Test test strip ?Generic drug: glucose blood ?Test BS 4 times daily Dx E11.9 ?  ?cyclobenzaprine 10 MG tablet ?Commonly known as: FLEXERIL ?  ?diazepam 5 MG tablet ?Commonly known as: VALIUM ?Take 5 mg by mouth in the morning and at bedtime. ?  ?diclofenac 75 MG EC tablet ?Commonly known as: VOLTAREN ?Take 1 tablet (75 mg total) by mouth 2 (two) times daily. ?  ?DULoxetine 60 MG capsule ?Commonly known as: Cymbalta ?Take 1 capsule (60 mg total) by mouth daily. ?  ?fluticasone 50 MCG/ACT nasal spray ?Commonly known as: FLONASE ?Place 2 sprays into both nostrils daily. ?  ?FreeStyle Libre 2 Sensor Misc ?by Does not apply route. ?  ?glipiZIDE 5 MG 24 hr tablet ?Commonly known as: GLUCOTROL XL ?Take 1 tablet (5 mg total) by mouth 2 (two) times daily with a meal. ?  ?Januvia 100 MG tablet ?Generic drug: sitaGLIPtin ?Take 100 mg by mouth daily. ?  ?lamoTRIgine 200 MG tablet ?Commonly known as: LAMICTAL ?Take 200 mg by mouth at bedtime. For mood disorder. ?  ?lansoprazole 30 MG  capsule ?Commonly known as: PREVACID ?Take 1 capsule (30 mg total) by mouth daily at 12 noon. ?  ?lisinopril 5 MG tablet ?Commonly known as: ZESTRIL ?Take 1 tablet (5 mg total) by mouth daily. ?  ?loratadine 10 MG tablet ?Commonly known as:

## 2021-10-02 NOTE — Telephone Encounter (Signed)
Received ONO and given to Dr. Halford Chessman Nothing further needed ?

## 2021-10-02 NOTE — Telephone Encounter (Signed)
Called and spoke to patient she states she is confused about what [place] has told her regarding cpap and O2.  ? ?ATC Cynthia Deleon from [employer]. LMTCB  ? ? ?

## 2021-10-02 NOTE — Telephone Encounter (Signed)
Mariann Laster called and states that for patient to have oxygen at night with CPAP, she is going to have to have a cpap titration done. She states the ONO alone will not meet requirements for insurance.  ?She states patient will also need an appointment between 6/19 and 7/18 to show compliance with CPAP for insurance to pay. Made Mariann Laster aware that patient was seen in office today and she states her CPAP was set up a few days ago so she would need another appt showing compliance with CPAP per insurance.  ? ?Dr. Halford Chessman please advise  ? ?

## 2021-10-02 NOTE — Patient Instructions (Signed)
Will make sure you have set up to use 2 liters oxygen at night with CPAP ? ?Will arrange for appointment with Eric Form to discuss the lung cancer screening program ? ?Follow up in 6 months ?

## 2021-10-02 NOTE — Telephone Encounter (Signed)
Called and left Cynthia Deleon from [place] a vm asking for ONO results to be faxed to [phone] ?

## 2021-10-02 NOTE — Addendum Note (Signed)
Addended by: Karle Plumber on: 10/02/2021 07:52 AM ? ? Modules accepted: Orders ? ?

## 2021-10-02 NOTE — Telephone Encounter (Signed)
Patient aware and referral placed.

## 2021-10-03 ENCOUNTER — Other Ambulatory Visit: Payer: Self-pay | Admitting: Family Medicine

## 2021-10-03 ENCOUNTER — Ambulatory Visit (INDEPENDENT_AMBULATORY_CARE_PROVIDER_SITE_OTHER): Payer: Medicare Other | Admitting: Family Medicine

## 2021-10-03 VITALS — BP 112/63 | HR 79 | Temp 98.3°F | Ht 69.0 in | Wt 174.0 lb

## 2021-10-03 DIAGNOSIS — E782 Mixed hyperlipidemia: Secondary | ICD-10-CM

## 2021-10-03 DIAGNOSIS — N393 Stress incontinence (female) (male): Secondary | ICD-10-CM

## 2021-10-03 DIAGNOSIS — J302 Other seasonal allergic rhinitis: Secondary | ICD-10-CM

## 2021-10-03 DIAGNOSIS — E11649 Type 2 diabetes mellitus with hypoglycemia without coma: Secondary | ICD-10-CM

## 2021-10-03 DIAGNOSIS — Z79899 Other long term (current) drug therapy: Secondary | ICD-10-CM

## 2021-10-03 DIAGNOSIS — I10 Essential (primary) hypertension: Secondary | ICD-10-CM

## 2021-10-03 NOTE — Telephone Encounter (Signed)
Can we have someone from [facility] check with her insurance to confirm this is accurate information. ?

## 2021-10-03 NOTE — Progress Notes (Signed)
Patient had to leave prior to being seen due to personal issues. ?

## 2021-10-03 NOTE — Telephone Encounter (Signed)
I will check on this tomorrow.  ?

## 2021-10-03 NOTE — Telephone Encounter (Signed)
Called and spoke to [place]. She states since CPAP titration is over 45 days old, for insurance purposes patient will have to repeat test.  ?

## 2021-10-03 NOTE — Telephone Encounter (Signed)
She had a CPAP titration study done August 13, 2021 with CPAP and supplemental oxygen. ? ?Okay to schedule follow up appointment between 11/26/21 and 12/25/21.  Can be video visit is Cynthia Deleon is okay with this. ?

## 2021-10-05 DIAGNOSIS — E1165 Type 2 diabetes mellitus with hyperglycemia: Secondary | ICD-10-CM | POA: Diagnosis not present

## 2021-10-07 DIAGNOSIS — E11649 Type 2 diabetes mellitus with hypoglycemia without coma: Secondary | ICD-10-CM

## 2021-10-07 DIAGNOSIS — I10 Essential (primary) hypertension: Secondary | ICD-10-CM

## 2021-10-08 NOTE — Telephone Encounter (Signed)
Holly, do we have an update?  ?

## 2021-10-11 ENCOUNTER — Other Ambulatory Visit: Payer: Self-pay | Admitting: Family Medicine

## 2021-10-11 DIAGNOSIS — J302 Other seasonal allergic rhinitis: Secondary | ICD-10-CM

## 2021-10-15 ENCOUNTER — Other Ambulatory Visit: Payer: Self-pay | Admitting: *Deleted

## 2021-10-15 DIAGNOSIS — Z122 Encounter for screening for malignant neoplasm of respiratory organs: Secondary | ICD-10-CM

## 2021-10-15 DIAGNOSIS — Z87891 Personal history of nicotine dependence: Secondary | ICD-10-CM

## 2021-10-16 NOTE — Telephone Encounter (Signed)
ATC patient. ?If she calls back she needs an appt at [facility] office from 6/19-7/18 with Dr. Halford Chessman for CPAP compliance.  ?

## 2021-10-16 NOTE — Telephone Encounter (Signed)
Cynthia Deleon with [employer] stated that pt needs a follow-up for CPAP between 6/19 and 7/18 for insurance compliance.  Please advise.  ?

## 2021-10-18 ENCOUNTER — Ambulatory Visit: Payer: Medicare Other | Admitting: Family Medicine

## 2021-10-22 NOTE — Telephone Encounter (Signed)
Called and made patient an appt. ?She asked about CPAP titration study needing to be repeated. Will route back to PCCs to follow up  ?

## 2021-10-23 ENCOUNTER — Ambulatory Visit: Payer: Medicare Other | Admitting: Family Medicine

## 2021-10-25 DIAGNOSIS — G4733 Obstructive sleep apnea (adult) (pediatric): Secondary | ICD-10-CM | POA: Diagnosis not present

## 2021-10-26 ENCOUNTER — Observation Stay (HOSPITAL_COMMUNITY)
Admission: EM | Admit: 2021-10-26 | Discharge: 2021-10-31 | Disposition: A | Discharge status: Home / Self Care | Payer: Medicare Other | Attending: Family Medicine | Admitting: Family Medicine

## 2021-10-26 ENCOUNTER — Emergency Department (HOSPITAL_COMMUNITY): Payer: Medicare Other

## 2021-10-26 ENCOUNTER — Encounter (HOSPITAL_COMMUNITY): Payer: Self-pay | Admitting: Emergency Medicine

## 2021-10-26 ENCOUNTER — Other Ambulatory Visit: Payer: Self-pay

## 2021-10-26 DIAGNOSIS — M8580 Other specified disorders of bone density and structure, unspecified site: Secondary | ICD-10-CM

## 2021-10-26 DIAGNOSIS — E119 Type 2 diabetes mellitus without complications: Secondary | ICD-10-CM | POA: Insufficient documentation

## 2021-10-26 DIAGNOSIS — K760 Fatty (change of) liver, not elsewhere classified: Secondary | ICD-10-CM

## 2021-10-26 DIAGNOSIS — E1142 Type 2 diabetes mellitus with diabetic polyneuropathy: Secondary | ICD-10-CM | POA: Diagnosis not present

## 2021-10-26 DIAGNOSIS — R079 Chest pain, unspecified: Secondary | ICD-10-CM | POA: Diagnosis not present

## 2021-10-26 DIAGNOSIS — M5136 Other intervertebral disc degeneration, lumbar region: Secondary | ICD-10-CM | POA: Diagnosis not present

## 2021-10-26 DIAGNOSIS — I2510 Atherosclerotic heart disease of native coronary artery without angina pectoris: Secondary | ICD-10-CM | POA: Diagnosis not present

## 2021-10-26 DIAGNOSIS — K5900 Constipation, unspecified: Secondary | ICD-10-CM | POA: Diagnosis not present

## 2021-10-26 DIAGNOSIS — R1030 Lower abdominal pain, unspecified: Secondary | ICD-10-CM | POA: Insufficient documentation

## 2021-10-26 DIAGNOSIS — M4317 Spondylolisthesis, lumbosacral region: Secondary | ICD-10-CM | POA: Insufficient documentation

## 2021-10-26 DIAGNOSIS — M8588 Other specified disorders of bone density and structure, other site: Secondary | ICD-10-CM | POA: Insufficient documentation

## 2021-10-26 DIAGNOSIS — Z79891 Long term (current) use of opiate analgesic: Secondary | ICD-10-CM | POA: Insufficient documentation

## 2021-10-26 DIAGNOSIS — R531 Weakness: Secondary | ICD-10-CM | POA: Diagnosis not present

## 2021-10-26 DIAGNOSIS — R2689 Other abnormalities of gait and mobility: Secondary | ICD-10-CM | POA: Diagnosis not present

## 2021-10-26 DIAGNOSIS — J9811 Atelectasis: Secondary | ICD-10-CM | POA: Diagnosis not present

## 2021-10-26 DIAGNOSIS — I10 Essential (primary) hypertension: Secondary | ICD-10-CM | POA: Diagnosis not present

## 2021-10-26 DIAGNOSIS — M47814 Spondylosis without myelopathy or radiculopathy, thoracic region: Secondary | ICD-10-CM | POA: Diagnosis not present

## 2021-10-26 DIAGNOSIS — Z7984 Long term (current) use of oral hypoglycemic drugs: Secondary | ICD-10-CM | POA: Diagnosis not present

## 2021-10-26 DIAGNOSIS — G8929 Other chronic pain: Secondary | ICD-10-CM | POA: Insufficient documentation

## 2021-10-26 DIAGNOSIS — K219 Gastro-esophageal reflux disease without esophagitis: Secondary | ICD-10-CM

## 2021-10-26 DIAGNOSIS — M5137 Other intervertebral disc degeneration, lumbosacral region: Secondary | ICD-10-CM | POA: Insufficient documentation

## 2021-10-26 DIAGNOSIS — M4316 Spondylolisthesis, lumbar region: Secondary | ICD-10-CM | POA: Insufficient documentation

## 2021-10-26 DIAGNOSIS — E1159 Type 2 diabetes mellitus with other circulatory complications: Secondary | ICD-10-CM | POA: Diagnosis present

## 2021-10-26 DIAGNOSIS — M25511 Pain in right shoulder: Secondary | ICD-10-CM | POA: Insufficient documentation

## 2021-10-26 DIAGNOSIS — Z833 Family history of diabetes mellitus: Secondary | ICD-10-CM | POA: Insufficient documentation

## 2021-10-26 DIAGNOSIS — Z8349 Family history of other endocrine, nutritional and metabolic diseases: Secondary | ICD-10-CM | POA: Insufficient documentation

## 2021-10-26 DIAGNOSIS — R339 Retention of urine, unspecified: Secondary | ICD-10-CM | POA: Insufficient documentation

## 2021-10-26 DIAGNOSIS — F319 Bipolar disorder, unspecified: Secondary | ICD-10-CM | POA: Diagnosis not present

## 2021-10-26 DIAGNOSIS — M5127 Other intervertebral disc displacement, lumbosacral region: Secondary | ICD-10-CM | POA: Insufficient documentation

## 2021-10-26 DIAGNOSIS — R10819 Abdominal tenderness, unspecified site: Secondary | ICD-10-CM | POA: Insufficient documentation

## 2021-10-26 DIAGNOSIS — I700 Atherosclerosis of aorta: Secondary | ICD-10-CM

## 2021-10-26 DIAGNOSIS — R2681 Unsteadiness on feet: Secondary | ICD-10-CM | POA: Insufficient documentation

## 2021-10-26 DIAGNOSIS — K580 Irritable bowel syndrome with diarrhea: Secondary | ICD-10-CM | POA: Insufficient documentation

## 2021-10-26 DIAGNOSIS — N3289 Other specified disorders of bladder: Secondary | ICD-10-CM | POA: Diagnosis not present

## 2021-10-26 DIAGNOSIS — E1140 Type 2 diabetes mellitus with diabetic neuropathy, unspecified: Secondary | ICD-10-CM | POA: Insufficient documentation

## 2021-10-26 DIAGNOSIS — J449 Chronic obstructive pulmonary disease, unspecified: Secondary | ICD-10-CM | POA: Diagnosis not present

## 2021-10-26 DIAGNOSIS — R269 Unspecified abnormalities of gait and mobility: Secondary | ICD-10-CM

## 2021-10-26 DIAGNOSIS — Z87891 Personal history of nicotine dependence: Secondary | ICD-10-CM | POA: Insufficient documentation

## 2021-10-26 DIAGNOSIS — G4733 Obstructive sleep apnea (adult) (pediatric): Secondary | ICD-10-CM | POA: Diagnosis not present

## 2021-10-26 DIAGNOSIS — R296 Repeated falls: Secondary | ICD-10-CM

## 2021-10-26 DIAGNOSIS — J984 Other disorders of lung: Secondary | ICD-10-CM | POA: Diagnosis not present

## 2021-10-26 DIAGNOSIS — Z043 Encounter for examination and observation following other accident: Secondary | ICD-10-CM | POA: Diagnosis not present

## 2021-10-26 DIAGNOSIS — M48061 Spinal stenosis, lumbar region without neurogenic claudication: Secondary | ICD-10-CM | POA: Insufficient documentation

## 2021-10-26 DIAGNOSIS — Z79818 Long term (current) use of other agents affecting estrogen receptors and estrogen levels: Secondary | ICD-10-CM

## 2021-10-26 DIAGNOSIS — M6281 Muscle weakness (generalized): Secondary | ICD-10-CM | POA: Insufficient documentation

## 2021-10-26 DIAGNOSIS — R338 Other retention of urine: Secondary | ICD-10-CM | POA: Diagnosis not present

## 2021-10-26 DIAGNOSIS — M51369 Other intervertebral disc degeneration, lumbar region without mention of lumbar back pain or lower extremity pain: Secondary | ICD-10-CM | POA: Diagnosis present

## 2021-10-26 DIAGNOSIS — R262 Difficulty in walking, not elsewhere classified: Secondary | ICD-10-CM

## 2021-10-26 DIAGNOSIS — Z8711 Personal history of peptic ulcer disease: Secondary | ICD-10-CM | POA: Insufficient documentation

## 2021-10-26 DIAGNOSIS — Z79890 Hormone replacement therapy (postmenopausal): Secondary | ICD-10-CM | POA: Insufficient documentation

## 2021-10-26 DIAGNOSIS — I152 Hypertension secondary to endocrine disorders: Secondary | ICD-10-CM | POA: Diagnosis present

## 2021-10-26 DIAGNOSIS — Z79899 Other long term (current) drug therapy: Secondary | ICD-10-CM

## 2021-10-26 DIAGNOSIS — K3189 Other diseases of stomach and duodenum: Secondary | ICD-10-CM | POA: Diagnosis not present

## 2021-10-26 DIAGNOSIS — M25551 Pain in right hip: Secondary | ICD-10-CM | POA: Insufficient documentation

## 2021-10-26 LAB — COMPREHENSIVE METABOLIC PANEL
ALT: 14 U/L (ref 0–44)
AST: 19 U/L (ref 15–41)
Albumin: 4.1 g/dL (ref 3.5–5.0)
Alkaline Phosphatase: 63 U/L (ref 38–126)
Anion gap: 6 (ref 5–15)
BUN: 8 mg/dL (ref 8–23)
CO2: 28 mmol/L (ref 22–32)
Calcium: 9.2 mg/dL (ref 8.9–10.3)
Chloride: 107 mmol/L (ref 98–111)
Creatinine, Ser: 0.7 mg/dL (ref 0.44–1.00)
GFR, Estimated: >60 mL/min (ref >60)
Glucose, Bld: 133 mg/dL — ABNORMAL HIGH (ref 70–99)
Potassium: 4.1 mmol/L (ref 3.5–5.1)
Sodium: 141 mmol/L (ref 135–145)
Total Bilirubin: 0.5 mg/dL (ref 0.3–1.2)
Total Protein: 6.5 g/dL (ref 6.5–8.1)

## 2021-10-26 LAB — URINALYSIS, ROUTINE W REFLEX MICROSCOPIC
Bilirubin Urine: NEGATIVE
Glucose, UA: 50 mg/dL — AB
Hgb urine dipstick: NEGATIVE
Ketones, ur: NEGATIVE mg/dL
Leukocytes,Ua: NEGATIVE
Nitrite: NEGATIVE
Protein, ur: NEGATIVE mg/dL
Specific Gravity, Urine: 1.003 — ABNORMAL LOW (ref 1.005–1.030)
pH: 7 (ref 5.0–8.0)

## 2021-10-26 LAB — CBC WITH DIFFERENTIAL/PLATELET
Abs Immature Granulocytes: 0.08 10*3/uL — ABNORMAL HIGH (ref 0.00–0.07)
Basophils Absolute: 0.1 10*3/uL (ref 0.0–0.1)
Basophils Relative: 1 %
Eosinophils Absolute: 0.1 10*3/uL (ref 0.0–0.5)
Eosinophils Relative: 2 %
HCT: 45.6 % (ref 36.0–46.0)
Hemoglobin: 14.9 g/dL (ref 12.0–15.0)
Immature Granulocytes: 1 %
Lymphocytes Relative: 24 %
Lymphs Abs: 2 10*3/uL (ref 0.7–4.0)
MCH: 31.4 pg (ref 26.0–34.0)
MCHC: 32.7 g/dL (ref 30.0–36.0)
MCV: 96 fL (ref 80.0–100.0)
Monocytes Absolute: 0.5 10*3/uL (ref 0.1–1.0)
Monocytes Relative: 6 %
Neutro Abs: 5.4 10*3/uL (ref 1.7–7.7)
Neutrophils Relative %: 66 %
Platelets: 216 10*3/uL (ref 150–400)
RBC: 4.75 MIL/uL (ref 3.87–5.11)
RDW: 12.6 % (ref 11.5–15.5)
WBC: 8.1 10*3/uL (ref 4.0–10.5)
nRBC: 0 % (ref 0.0–0.2)

## 2021-10-26 LAB — CK: Total CK: 82 U/L (ref 38–234)

## 2021-10-26 LAB — HEMOGLOBIN A1C
Hgb A1c MFr Bld: 7.3 % — ABNORMAL HIGH (ref 4.8–5.6)
Mean Plasma Glucose: 162.81 mg/dL

## 2021-10-26 LAB — GLUCOSE, CAPILLARY: Glucose-Capillary: 119 mg/dL — ABNORMAL HIGH (ref 70–99)

## 2021-10-26 LAB — TSH: TSH: 0.478 u[IU]/mL (ref 0.350–4.500)

## 2021-10-26 LAB — HIV ANTIBODY (ROUTINE TESTING W REFLEX): HIV Screen 4th Generation wRfx: NONREACTIVE

## 2021-10-26 LAB — VITAMIN B12: Vitamin B-12: 320 pg/mL (ref 180–914)

## 2021-10-26 LAB — CBG MONITORING, ED: Glucose-Capillary: 145 mg/dL — ABNORMAL HIGH (ref 70–99)

## 2021-10-26 LAB — VITAMIN D 25 HYDROXY (VIT D DEFICIENCY, FRACTURES): Vit D, 25-Hydroxy: 32.91 ng/mL (ref 30–100)

## 2021-10-26 MED ORDER — OXYCODONE HCL 5 MG PO TABS
15.0000 mg | ORAL_TABLET | Freq: Four times a day (QID) | ORAL | Status: DC | PRN
  Administered 2021-10-26 – 2021-10-27 (×3): 15 mg via ORAL
  Administered 2021-10-27: 5 mg via ORAL
  Filled 2021-10-26 (×4): qty 3

## 2021-10-26 MED ORDER — MONTELUKAST SODIUM 10 MG PO TABS
10.0000 mg | ORAL_TABLET | Freq: Every day | ORAL | Status: DC
  Administered 2021-10-26 – 2021-10-30 (×5): 10 mg via ORAL
  Filled 2021-10-26 (×5): qty 1

## 2021-10-26 MED ORDER — ESTROGENS CONJUGATED 0.3 MG PO TABS
0.3000 mg | ORAL_TABLET | Freq: Every day | ORAL | Status: DC
  Administered 2021-10-29 – 2021-10-31 (×3): 0.3 mg via ORAL
  Filled 2021-10-26 (×2): qty 1
  Filled 2021-10-26: qty 2
  Filled 2021-10-26 (×7): qty 1

## 2021-10-26 MED ORDER — LAMOTRIGINE 100 MG PO TABS
200.0000 mg | ORAL_TABLET | Freq: Every day | ORAL | Status: DC
  Administered 2021-10-26 – 2021-10-30 (×5): 200 mg via ORAL
  Filled 2021-10-26 (×7): qty 2

## 2021-10-26 MED ORDER — INSULIN ASPART 100 UNIT/ML IJ SOLN
0.0000 [IU] | Freq: Every day | INTRAMUSCULAR | Status: DC
  Administered 2021-10-28: 3 [IU] via SUBCUTANEOUS
  Administered 2021-10-29 – 2021-10-30 (×2): 2 [IU] via SUBCUTANEOUS

## 2021-10-26 MED ORDER — DULOXETINE HCL 60 MG PO CPEP
60.0000 mg | ORAL_CAPSULE | Freq: Every day | ORAL | Status: DC
Start: 2021-10-26 — End: 2021-10-31
  Administered 2021-10-26 – 2021-10-31 (×6): 60 mg via ORAL
  Filled 2021-10-26 (×4): qty 1
  Filled 2021-10-26: qty 2
  Filled 2021-10-26: qty 1

## 2021-10-26 MED ORDER — IOHEXOL 300 MG/ML  SOLN
100.0000 mL | Freq: Once | INTRAMUSCULAR | Status: AC | PRN
  Administered 2021-10-26: 100 mL via INTRAVENOUS

## 2021-10-26 MED ORDER — HYDRALAZINE HCL 20 MG/ML IJ SOLN: 10.0000 mg | Freq: Four times a day (QID) | INTRAMUSCULAR | Status: DC | PRN

## 2021-10-26 MED ORDER — PREGABALIN 75 MG PO CAPS
150.0000 mg | ORAL_CAPSULE | Freq: Three times a day (TID) | ORAL | Status: DC
  Administered 2021-10-26 – 2021-10-31 (×15): 150 mg via ORAL
  Filled 2021-10-26 (×15): qty 2

## 2021-10-26 MED ORDER — ONDANSETRON HCL 4 MG PO TABS: 4.0000 mg | ORAL_TABLET | Freq: Four times a day (QID) | ORAL | Status: DC | PRN

## 2021-10-26 MED ORDER — BISACODYL 5 MG PO TBEC: 5.0000 mg | DELAYED_RELEASE_TABLET | Freq: Every day | ORAL | Status: DC | PRN

## 2021-10-26 MED ORDER — ONDANSETRON HCL 4 MG/2ML IJ SOLN
4.0000 mg | Freq: Four times a day (QID) | INTRAMUSCULAR | Status: DC | PRN
Start: 2021-10-26 — End: 2021-10-31

## 2021-10-26 MED ORDER — SODIUM CHLORIDE 0.9 % IV SOLN: 250.0000 mL | INTRAVENOUS | Status: DC | PRN

## 2021-10-26 MED ORDER — PANTOPRAZOLE SODIUM 20 MG PO TBEC
20.0000 mg | DELAYED_RELEASE_TABLET | Freq: Every day | ORAL | Status: DC
  Administered 2021-10-26 – 2021-10-31 (×5): 20 mg via ORAL
  Filled 2021-10-26 (×4): qty 1
  Filled 2021-10-26: qty 2
  Filled 2021-10-26 (×4): qty 1

## 2021-10-26 MED ORDER — SODIUM CHLORIDE 0.9% FLUSH: 3.0000 mL | INTRAVENOUS | Status: DC | PRN

## 2021-10-26 MED ORDER — SODIUM CHLORIDE 0.9% FLUSH
3.0000 mL | Freq: Two times a day (BID) | INTRAVENOUS | Status: DC
  Administered 2021-10-26 – 2021-10-28 (×4): 3 mL via INTRAVENOUS

## 2021-10-26 MED ORDER — ALBUTEROL SULFATE (2.5 MG/3ML) 0.083% IN NEBU
3.0000 mL | INHALATION_SOLUTION | Freq: Four times a day (QID) | RESPIRATORY_TRACT | Status: DC | PRN
  Administered 2021-10-29: 3 mL via RESPIRATORY_TRACT
  Filled 2021-10-26: qty 3

## 2021-10-26 MED ORDER — POLYETHYLENE GLYCOL 3350 17 G PO PACK
17.0000 g | PACK | Freq: Every day | ORAL | Status: DC
  Administered 2021-10-26 – 2021-10-27 (×2): 17 g via ORAL
  Filled 2021-10-26 (×2): qty 1

## 2021-10-26 MED ORDER — FLUTICASONE PROPIONATE 50 MCG/ACT NA SUSP
2.0000 | Freq: Every day | NASAL | Status: DC
  Administered 2021-10-26 – 2021-10-31 (×5): 2 via NASAL
  Filled 2021-10-26 (×2): qty 16

## 2021-10-26 MED ORDER — AZELASTINE HCL 0.1 % NA SOLN
1.0000 | Freq: Two times a day (BID) | NASAL | Status: DC
  Administered 2021-10-26 – 2021-10-31 (×7): 1 via NASAL
  Filled 2021-10-26 (×2): qty 30

## 2021-10-26 MED ORDER — MECLIZINE HCL 12.5 MG PO TABS: 50.0000 mg | ORAL_TABLET | Freq: Every day | ORAL | Status: DC | PRN

## 2021-10-26 MED ORDER — DOCUSATE SODIUM 100 MG PO CAPS
100.0000 mg | ORAL_CAPSULE | Freq: Two times a day (BID) | ORAL | Status: DC
  Administered 2021-10-26 – 2021-10-31 (×11): 100 mg via ORAL
  Filled 2021-10-26 (×11): qty 1

## 2021-10-26 MED ORDER — ENOXAPARIN SODIUM 40 MG/0.4ML IJ SOSY
40.0000 mg | PREFILLED_SYRINGE | Freq: Every day | INTRAMUSCULAR | Status: DC
  Administered 2021-10-26 – 2021-10-31 (×6): 40 mg via SUBCUTANEOUS
  Filled 2021-10-26 (×6): qty 0.4

## 2021-10-26 MED ORDER — INSULIN ASPART 100 UNIT/ML IJ SOLN
0.0000 [IU] | Freq: Three times a day (TID) | INTRAMUSCULAR | Status: DC
  Administered 2021-10-26: 1 [IU] via SUBCUTANEOUS
  Administered 2021-10-27: 3 [IU] via SUBCUTANEOUS
  Administered 2021-10-27: 2 [IU] via SUBCUTANEOUS
  Administered 2021-10-27 – 2021-10-28 (×2): 3 [IU] via SUBCUTANEOUS
  Administered 2021-10-28: 2 [IU] via SUBCUTANEOUS
  Administered 2021-10-28: 1 [IU] via SUBCUTANEOUS
  Administered 2021-10-29: 5 [IU] via SUBCUTANEOUS
  Administered 2021-10-29 (×2): 2 [IU] via SUBCUTANEOUS
  Administered 2021-10-30: 5 [IU] via SUBCUTANEOUS
  Administered 2021-10-30: 9 [IU] via SUBCUTANEOUS
  Administered 2021-10-30: 2 [IU] via SUBCUTANEOUS
  Administered 2021-10-31: 1 [IU] via SUBCUTANEOUS
  Filled 2021-10-26: qty 1

## 2021-10-26 NOTE — Hospital Course (Addendum)
Patient is a 66 years old female with past medical history of bipolar disorder, chronic back pain, COPD, depression, diabetes mellitus, GERD, hypertension, peripheral neuropathy presented to hospital with complaints of generalized weakness and multiple falls, constipation. Her ex-husband had been helping her some due to her weakness and falls.  Patient does have history of chronic back pain and is on chronic pain medications. In the ED, patient had stable vitals,  Urinalysis was negative for infection.  TSH of 0.4.  Potassium of 4.1.  Creatinine at 0.7.  LFTs within normal limits.  CBC was unremarkable.  Due to frequent falls and generalized weakness and concerns for safety at home, patient was considered for  admission to the hospital.  Assessment and plan.  Principal Problem:   Falls frequently Active Problems:   Current use of estrogen therapy   Degeneration of lumbar intervertebral disc   Essential hypertension   COPD (chronic obstructive pulmonary disease) (HCC)   GERD (gastroesophageal reflux disease)   Polyneuropathy due to type 2 diabetes mellitus (HCC)   Gait abnormality   Bipolar 1 disorder (HCC)   Constipation   Acute urinary retention   Generalized weakness   Ambulatory dysfunction   Generalized weakness, multiple falls with ambulatory dysfunction.   Likely multifactorial secondary to polyneuropathy, degenerative joint disease of the back, polypharmacy.  vitamin B12 at 320, vitamin D at 32.  CK levels within normal limits.  Continue oral vitamin B12 and vitamin D.  TSH within normal limits.  On oxycodone and diazepam as outpatient.  We will continue oxycodone.  Plan is to discontinue benzodiazepines on discharge.  Currently not on it.  Patient has multiple medications and there is concern for polypharmacy .  Continue fall precautions.  Lumbar spine CT scan showed osteopenia with degeneration largely stable from 2020 MRI.  Chronic disc degeneration at the L5-S1 with retrolisthesis  with moderate to severe bilateral L5 foraminal stenosis.   Patient was seen by physical therapy recommended skilled nursing facility placement at this time. Would benefit from aggressive rehabilitation.  Right shoulder pain, right hip/pelvic pain.  Will get x-ray of the right shoulder and pelvis.  Complains of right lower extremity pain and will get the ultrasound of the right lower extremity to rule out DVT.  Abdominal discomfort, constipation.  CT scan of the abdomen and pelvis showed some evidence of constipation.  On aggressive bowel regimen and will continue with bowel regimen while in the hospital  Acute urinary retention.  Resolved at this time.  Temporarily required Foley catheter  Chronic back pain, degenerative joint disease. Chronically on narcotics as outpatient with oxycodone 15 mg every 6 hours.  Continue oxycodone while in the hospital.  Patient follows up with pain management clinic as outpatient.    COPD Continue albuterol inhaler.  Currently compensated.  Diabetes mellitus with peripheral neuropathy. On Trulicity, Januvia and glipizide as outpatient.  We will hold.  Continue sliding scale insulin, Lyrica, diabetic diet.  Closely monitor blood glucose levels.  Latest POC glucose of 257  Bipolar disorder depression.  Continue duloxetine, lamotrigine, Lyrica.    GERD/history of peptic ulcer disease. Continue Protonix.  Essential hypertension On lisinopril as outpatient.  Has been resumed at this time.  Latest blood pressure of 105/50

## 2021-10-26 NOTE — H&P (Signed)
[facility] History and Physical  Cynthia Deleon XVQ:[id number] DOB: 21-May-1956 DOA: 10/26/2021  Referring physician: ED  PCP: Loman Brooklyn, FNP   Patient is coming from: Home   Chief Complaint: falls  HPI:  Patient is a 66 years old female with past medical history of bipolar disorder, chronic back pain, COPD, depression, diabetes mellitus, GERD, hypertension, peripheral neuropathy presented to hospital with complaints of generalized weakness and multiple falls of around 5 times in the last 2 days.  Patient lives by self at home.  She also complained of constipation for the last 4 days with some abdominal discomfort but denied any nausea or vomiting.  She however complained of shoulder pain from recent fall with some bruising over the right elbow.  She however denied any trauma to the head or loss of consciousness. she complains of generalized weakness in her legs and arms and difficulty getting up.  Her ex-husband had been helping her some due to her weakness and falls.  Patient does have history of chronic back pain and is on chronic pain medications.  Patient denies any fever, chills or rigor.  Denies any shortness of breath or chest pain.  In the ED, patient had stable vitals.  Had mild abdominal tenderness.  Urinalysis was negative for infection.  TSH of 0.4.  Potassium of 4.1.  Creatinine at 0.7.  LFTs within normal limits.  CBC was unremarkable.  Due to frequent falls and generalized weakness and concerns for safety at home with constipation patient was considered for observation in the hospital for further evaluation  Assessment and plan.  Principal Problem:   Falls frequently Active Problems:   Current use of estrogen therapy   Degeneration of lumbar intervertebral disc   Essential hypertension   COPD (chronic obstructive pulmonary disease) (HCC)   GERD (gastroesophageal reflux disease)   Polyneuropathy due to type 2 diabetes mellitus (HCC)   Gait abnormality    Bipolar 1 disorder (HCC)   Constipation   Acute urinary retention   Generalized weakness, multiple falls in the last 2 days with ambulatory dysfunction.  Laboratory data unremarkable.  Patient does have history of neuropathy.  We will get physical therapy optional therapy evaluation.  Check vitamin B12, vitamin D levels.  TSH within normal limits.  Patient might need rehabilitation placement.  Check CK levels, on  Lipitor as outpatient will hold for now.  On oxycodone and diazepam as outpatient.  We will continue oxycodone.  Hold diazepam for now.  There is high risk with this combination.  Patient has multiple medications and there is concern for polypharmacy as well.  Patient is on benzodiazepines, opiates and several other medications which could be contributing to this.  Had a prolonged discussion with the patient about it.  We will hold off with benzodiazepines for now.  Abdominal discomfort, constipation.  CT scan of the abdomen and pelvis with some evidence of constipation.  We will continue with bowel regimen including suppository as needed.  Acute urinary retention. On foley catheter, focus on bowel regimen.  We will hold off with oxybutynin from home  Chronic back pain, degenerative joint disease. On narcotics as outpatient with oxycodone 15 mg every 6 hours. Patient follows up with pain management clinic as outpatient.   COPD Continue albuterol inhaler.  Currently compensated.  Diabetes mellitus with peripheral neuropathy. On Trulicity, Januvia and glipizide as outpatient.  We will hold.  Continue with sliding scale insulin while in the hospital.  Continue Lyrica  Bipolar disorder depression.  On duloxetine, lamotrigine, Lyrica we will continue with that.  GERD Continue Protonix.  Essential hypertension On lisinopril as outpatient.  Hold for today.   DVT Prophylaxis:lovenox subq   Review of Systems:  All systems were reviewed and were negative unless otherwise mentioned in  the HPI   Past Medical History:  Diagnosis Date   Arthritis    hands and knees   Bipolar 1 disorder (HCC)    Borderline diabetic    Chronic back pain    Chronic neck pain    Complication of anesthesia    high anxiety-does not want to be alone   COPD (chronic obstructive pulmonary disease) (HCC)    Current use of estrogen therapy 01/12/2014   Depression    Diabetes mellitus without complication (HCC)    Diplopia    Family history of adverse reaction to anesthesia    sister "gas in lungs"   GERD (gastroesophageal reflux disease)    Headache    Hot flashes 12/15/2013   Hypertension    IBS (irritable bowel syndrome)    Incontinence    Kidney stone    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[place])    Panic attacks    Peptic ulcer    Peripheral neuropathy    Rosacea    Shingles    Sleep apnea    uses a cpap-with oxygen   Past Surgical History:  Procedure Laterality Date   BIOPSY  04/27/2020   Procedure: BIOPSY;  Surgeon: Ronald Lobo, MD;  Location: [facility];  Service: Endoscopy;;   BUNIONECTOMY WITH HAMMERTOE RECONSTRUCTION Right 12/10/2012   Procedure: RIGHT FIRST METATARSAL CHEVRON BUNION CORRECTION,  2 AND 3 HAMMERTOE CORRECTION , RIGHT 3 AND 4 TOE NAIL EXCISION ;  Surgeon: Wylene Simmer, MD;  Location: [facility];  Service: Orthopedics;  Laterality: Right;   COLONOSCOPY WITH PROPOFOL N/A 04/27/2020   Procedure: COLONOSCOPY WITH PROPOFOL;  Surgeon: Ronald Lobo, MD;  Location: [facility];  Service: Endoscopy;  Laterality: N/A;   FOOT ARTHRODESIS  2000   both feet   LIPOSUCTION  03/2018   RECTAL SURGERY     TONSILLECTOMY     TOTAL ABDOMINAL HYSTERECTOMY      Social History:  reports that she quit smoking about 4 years ago. Her smoking use included cigarettes. She has a 42.00 pack-year smoking history. She has never used smokeless tobacco. She reports that she does not drink alcohol and does not use drugs.  Allergies  Allergen Reactions   Chantix  [Varenicline] Other (See Comments)    Altered mental status-Per patient was put on allergy list by Dr Lorriane Shire bu patient staes she is not allergic to this and is currently taking it.    Divalproex Sodium Other (See Comments)    Hallucinations  Other reaction(s): Confusion   Other Anaphylaxis   Pseudoeph-Hydrocodone-Gg Nausea Only   Sulfa Antibiotics Anaphylaxis, Nausea Only, Swelling and Other (See Comments)    Swelling of tongue.   Varenicline Tartrate Other (See Comments)    Other reaction(s): Confusion   Ambien [Zolpidem] Other (See Comments)    Causes sleep walking   Clavulanic Acid Diarrhea   Codeine Nausea And Vomiting and Other (See Comments)   Elemental Sulfur Other (See Comments)    Swelling of tongue   Hydrocod Poli-Chlorphe Poli Er Other (See Comments)    Other reaction(s): Skin itches    Hydrocodone Nausea And Vomiting   Macrobid [Nitrofurantoin] Nausea And Vomiting    Dizziness, nausea, vomiting   Paxil [Paroxetine Hcl] Other (See  Comments)    Causes ringing in the ears.    Prednisone Other (See Comments)    Other reaction(s): Unknown    Amoxicillin Rash    Has patient had a PCN reaction causing immediate rash, facial/tongue/throat swelling, SOB or lightheadedness with hypotension: No Has patient had a PCN reaction causing severe rash involving mucus membranes or skin necrosis: No Has patient had a PCN reaction that required hospitalization: No Has patient had a PCN reaction occurring within the last 10 years: Yes If all of the above answers are "NO", then may proceed with Cephalosporin use.    Wilder Glade [Dapagliflozin]     RECURRENT YEAST/UTI   Metformin And Related Diarrhea   Tape Rash    Family History  Problem Relation Age of Onset   Diabetes Mother    Other Mother        vertigo; chronic eye disease   COPD Father    COPD Sister    Alcohol abuse Brother    Diabetes Maternal Grandmother    Diabetes Maternal Grandfather    Diabetes Sister    Other  Sister        hearing problems   Hyperlipidemia Sister    Alcohol abuse Brother    COPD Brother    Alcohol abuse Brother    Alcohol abuse Brother    Other Brother        aneursym     Prior to Admission medications   Medication Sig Start Date End Date Taking? Authorizing Provider  albuterol (VENTOLIN HFA) 108 (90 Base) MCG/ACT inhaler Inhale 2 puffs into the lungs every 6 (six) hours as needed. 07/20/21  Yes [name] F, FNP  atorvastatin (LIPITOR) 40 MG tablet Take 1 tablet (40 mg total) by mouth daily. 03/29/21  Yes [name] F, FNP  azelastine (ASTELIN) 0.1 % nasal spray Place 1 spray into both nostrils 2 (two) times daily. Use in each nostril as directed 02/20/21  Yes Chesley Mires, MD  diazepam (VALIUM) 5 MG tablet Take 5 mg by mouth in the morning and at bedtime.    Yes [provider]  Dulaglutide (TRULICITY) 3 YT/0.3TW SOPN Inject 3 mg as directed once a week. DX: E11.65 07/24/21  Yes [name] F, FNP  DULoxetine (CYMBALTA) 60 MG capsule Take 1 capsule (60 mg total) by mouth daily. 05/21/21  Yes Marcial Pacas, MD  fluticasone (FLONASE) 50 MCG/ACT nasal spray USE 2 SPRAYS IN EACH NOSTRIL ONCE DAILY. 10/11/21  Yes Loman Brooklyn, FNP  glipiZIDE (GLUCOTROL XL) 5 MG 24 hr tablet Take 1 tablet (5 mg total) by mouth 2 (two) times daily with a meal. 07/13/21  Yes [name] F, FNP  JANUVIA 100 MG tablet TAKE (1) TABLET BY MOUTH ONCE DAILY. 10/03/21  Yes [name] F, FNP  lamoTRIgine (LAMICTAL) 200 MG tablet Take 200 mg by mouth at bedtime. For mood disorder.   Yes [provider]  lansoprazole (PREVACID) 30 MG capsule Take 1 capsule (30 mg total) by mouth daily at 12 noon. 10/04/20  Yes [name] F, FNP  lisinopril (ZESTRIL) 5 MG tablet Take 1 tablet (5 mg total) by mouth daily. 03/29/21  Yes [name] F, FNP  meclizine (ANTIVERT) 25 MG tablet Take 2 tablets (50 mg total) by mouth daily as needed for dizziness. 03/08/21  Yes [name] F, FNP   montelukast (SINGULAIR) 10 MG tablet TAKE (1) TABLET BY MOUTH AT BEDTIME. 03/29/21  Yes [name] F, FNP  oxybutynin (DITROPAN XL) 15 MG 24  hr tablet Take 1 tablet (15 mg total) by mouth at bedtime. 09/07/21  Yes [name] F, FNP  oxyCODONE (ROXICODONE) 15 MG immediate release tablet Take 15 mg by mouth every 6 (six) hours as needed.   Yes [provider]  pregabalin (LYRICA) 150 MG capsule Take 1 capsule (150 mg total) by mouth 3 (three) times daily. 09/13/21  Yes [name] F, FNP  PREMARIN 0.3 MG tablet TAKE (1) TABLET BY MOUTH ONCE DAILY. 07/31/21  Yes Loman Brooklyn, FNP  promethazine (PHENERGAN) 12.5 MG tablet Take 1 tablet (12.5 mg total) by mouth every 8 (eight) hours as needed for nausea or vomiting. 08/09/21  Yes [name] F, FNP  diclofenac (VOLTAREN) 75 MG EC tablet Take 1 tablet (75 mg total) by mouth 2 (two) times daily. Patient not taking: Reported on 10/26/2021 09/13/21   Loman Brooklyn, FNP  glucose blood (CONTOUR NEXT TEST) test strip Test BS 4 times daily Dx E11.9 12/14/19   Loman Brooklyn, FNP  loratadine (CLARITIN) 10 MG tablet Take 1 tablet (10 mg total) by mouth daily. Patient not taking: Reported on 10/26/2021 05/28/21   Chesley Mires, MD  OneTouch Delica Lancets 06C MISC Use to test blood sugar twice daily. DX: E11.9 02/28/20   Loman Brooklyn, FNP  valACYclovir (VALTREX) 500 MG tablet TAKE (1) TABLET BY MOUTH ONCE DAILY AS NEEDED FOR SHINGLES FLARE UP. Patient not taking: Reported on 10/26/2021 07/06/21   Loman Brooklyn, FNP    Physical Exam: Vitals:   10/26/21 1045 10/26/21 1100 10/26/21 1115 10/26/21 1130  BP:  119/75  105/69  Pulse: 67 86 80 82  Resp: '15 16 10 10  '$ Temp:      TempSrc:      SpO2: (!) 89% 93% 96% 92%  Weight:      Height:       Wt Readings from Last 3 Encounters:  10/26/21 78 kg  10/03/21 78.9 kg  10/02/21 78.6 kg   Body mass index is 25.4 kg/m.  General:  Average built, not in obvious distress HENT: Normocephalic,  pupils equally reacting to light and accommodation.  No scleral pallor or icterus noted. Oral mucosa is moist.  Chest:  Clear breath sounds.  Diminished breath sounds bilaterally. No crackles or wheezes.  CVS: S1 &S2 heard. No murmur.  Regular rate and rhythm. Abdomen: Soft, nontender, nondistended.  Bowel sounds are heard.  Liver is not palpable, no abdominal mass palpated. Foley catheter in place. Extremities: No cyanosis, clubbing or edema.  Peripheral pulses are palpable. Psych: Alert, awake and oriented, normal mood CNS:  No cranial nerve deficits.  Power equal in all extremities.  Gait couldn't be tested. Skin: Warm and dry.  No rashes noted.  Labs on Admission:   CBC: Recent Labs  Lab 10/26/21 0900  WBC 8.1  NEUTROABS 5.4  HGB 14.9  HCT 45.6  MCV 96.0  PLT 376    Basic Metabolic Panel: Recent Labs  Lab 10/26/21 0900  NA 141  K 4.1  CL 107  CO2 28  GLUCOSE 133*  BUN 8  CREATININE 0.70  CALCIUM 9.2    Liver Function Tests: Recent Labs  Lab 10/26/21 0900  AST 19  ALT 14  ALKPHOS 63  BILITOT 0.5  PROT 6.5  ALBUMIN 4.1   No results for input(s): LIPASE, AMYLASE in the last 168 hours. No results for input(s): AMMONIA in the last 168 hours.  Cardiac Enzymes: No results for input(s): CKTOTAL, CKMB, CKMBINDEX, TROPONINI  in the last 168 hours.  BNP (last 3 results) No results for input(s): BNP in the last 8760 hours.  ProBNP (last 3 results) No results for input(s): PROBNP in the last 8760 hours.  CBG: No results for input(s): GLUCAP in the last 168 hours.  Lipase     Component Value Date/Time   LIPASE 42 11/20/2016 1942     Urinalysis    Component Value Date/Time   COLORURINE STRAW (A) 10/26/2021 0844   APPEARANCEUR CLEAR 10/26/2021 0844   APPEARANCEUR Clear 09/13/2021 1035   LABSPEC 1.003 (L) 10/26/2021 0844   PHURINE 7.0 10/26/2021 0844   GLUCOSEU 50 (A) 10/26/2021 0844   HGBUR NEGATIVE 10/26/2021 [place] 10/26/2021  0844   BILIRUBINUR Negative 09/13/2021 [place] 10/26/2021 0844   PROTEINUR NEGATIVE 10/26/2021 0844   UROBILINOGEN 0.2 11/26/2014 0659   NITRITE NEGATIVE 10/26/2021 0844   LEUKOCYTESUR NEGATIVE 10/26/2021 0844     Drugs of Abuse     Component Value Date/Time   LABOPIA NONE DETECTED 11/12/2015 2105   COCAINSCRNUR NONE DETECTED 11/12/2015 2105   LABBENZ POSITIVE (A) 11/12/2015 2105   AMPHETMU NONE DETECTED 11/12/2015 2105   THCU NONE DETECTED 11/12/2015 2105   LABBARB NONE DETECTED 11/12/2015 2105      Radiological Exams on Admission: DG Chest 2 View  Result Date: 10/26/2021 CLINICAL DATA:  Pain after fall. EXAM: CHEST - 2 VIEW COMPARISON:  None Available. FINDINGS: The heart size and mediastinal contours are within normal limits. Both lungs are clear. Osteopenia and mild thoracic spondylosis. No appreciable fracture. IMPRESSION: No active cardiopulmonary disease. No appreciable fracture. Thoracic spondylosis. Electronically Signed   By: Keane Police D.O.   On: 10/26/2021 09:51   DG Shoulder Right  Result Date: 10/26/2021 CLINICAL DATA:  Fall EXAM: RIGHT SHOULDER - 2+ VIEW COMPARISON:  None Available. FINDINGS: There is no acute fracture or dislocation. Glenohumeral and acromioclavicular alignment is maintained. The joint spaces are preserved. The soft tissues are unremarkable. IMPRESSION: No acute fracture or dislocation. Electronically Signed   By: Valetta Mole M.D.   On: 10/26/2021 09:52   CT CHEST ABDOMEN PELVIS W CONTRAST  Result Date: 10/26/2021 CLINICAL DATA:  Lower abdominal pain with weakness x2 days. Fall 5 times in the past 2 days. EXAM: CT CHEST, ABDOMEN, AND PELVIS WITH CONTRAST TECHNIQUE: Multidetector CT imaging of the chest, abdomen and pelvis was performed following the standard protocol during bolus administration of intravenous contrast. RADIATION DOSE REDUCTION: This exam was performed according to the departmental dose-optimization program which  includes automated exposure control, adjustment of the mA and/or kV according to patient size and/or use of iterative reconstruction technique. CONTRAST:  166m OMNIPAQUE IOHEXOL 300 MG/ML  SOLN COMPARISON:  CT April 30, 2019 FINDINGS: CT CHEST FINDINGS Cardiovascular: Aortic atherosclerosis without aneurysmal dilation of the thoracic aorta. No central pulmonary embolus on this nondedicated study. Normal size heart. No significant pericardial effusion/thickening. Coronary artery calcifications. Mediastinum/Nodes: No suspicious thyroid nodule. No supraclavicular adenopathy. No mediastinal fluid or hematoma. No pathologically enlarged mediastinal, hilar or axillary lymph nodes. Lungs/Pleura: Mild biapical pleuroparenchymal scarring. Hypoventilatory change in the lung bases. Atelectasis in the right middle lobe and lingula. No evidence of parenchymal contusion or laceration. No pleural effusion. No pneumothorax. Musculoskeletal: Multilevel degenerative changes spine. No acute osseous abnormality. CT ABDOMEN PELVIS FINDINGS Hepatobiliary: Mild hepatic steatosis with focal fatty sparing along the gallbladder fossa. No suspicious hepatic lesion. No hepatic injury or peripancreatic hematoma. Gallbladder is unremarkable. No biliary ductal dilation.  Pancreas: No pancreatic ductal dilation, acute inflammation or evidence of laceration. Spleen: No splenic injury or perisplenic hematoma. Adrenals/Urinary Tract: No adrenal hemorrhage or renal injury identified. No hydronephrosis. Kidneys demonstrate symmetric enhancement and excretion of contrast material. Bladder is significantly distended extending above the sacral promontory. Stomach/Bowel: No radiopaque enteric contrast material was administered. Stomach is minimally distended limiting evaluation. There is no pathologic dilation of small or large bowel. Terminal ileum appears normal. Normal appendix. Large volume of formed stool throughout the colon suggestive of  constipation. No evidence of acute bowel inflammation. Vascular/Lymphatic: Aortic atherosclerosis without aneurysmal dilation. No pathologically enlarged abdominal or pelvic lymph nodes. Reproductive: Status post hysterectomy. No adnexal masses. Other: No significant abdominopelvic free fluid. No abdominal wall herniation. Musculoskeletal: Multilevel degenerative changes spine. No acute osseous abnormality. IMPRESSION: 1. No evidence of acute traumatic injury in the chest, abdomen, or pelvis. 2. Large volume of formed stool throughout the colon suggestive of constipation. 3. Significantly distended urinary bladder extending above the sacral promontory. Correlate for urinary retention. 4. Hepatic steatosis. 5.  Aortic Atherosclerosis (ICD10-I70.0). Electronically Signed   By: Dahlia Bailiff M.D.   On: 10/26/2021 11:51    EKG: Personally reviewed by me which shows NSR  Consultant: None     Code Status: Full code   Microbiology none  Antibiotics:none   Family Communication:  Patients' condition and plan of care including tests being ordered have been discussed with the patient  who indicate understanding and agree with the plan.   Status is: Observation The patient remains OBS appropriate and will d/c before 2 midnights.   Severity of Illness: The appropriate patient status for this patient is OBSERVATION. Observation status is judged to be reasonable and necessary in order to provide the required intensity of service to ensure the patient's safety. The patient's presenting symptoms, physical exam findings, and initial radiographic and laboratory data in the context of their medical condition is felt to place them at decreased risk for further clinical deterioration. Furthermore, it is anticipated that the patient will be medically stable for discharge from the hospital within 2 midnights of admission.   Signed, Flora Lipps, MD [facility] 10/26/2021

## 2021-10-26 NOTE — ED Notes (Signed)
Pt returned from xray. Placed on bedpan. Nad. No changes.

## 2021-10-26 NOTE — ED Triage Notes (Addendum)
Pt c/o all over weakness "feeling like jello" x 2 days. Pt c/o fall x 5 in past 2 days. Pt arrived PV a/o. Gen weakness noted. C/o no bm x 4 days. Denies n/v/d. Abd distended and mildly firm. Denies gu sx's.  Pt c/o r shoulder pain from fall. No abnormalities noted. Rom limited due to pain. Bruising noted to right elbow.

## 2021-10-26 NOTE — ED Provider Notes (Signed)
[facility] EMERGENCY DEPARTMENT Provider Note   CSN: [id number] Arrival date & time: 10/26/21  0744     History  Chief Complaint  Patient presents with   Weakness   Fall    Cynthia Deleon is a 66 y.o. female.   Weakness Associated symptoms: abdominal pain   Associated symptoms: no fever and no shortness of breath   Fall Associated symptoms include abdominal pain. Pertinent negatives include no shortness of breath. Patient presents with generalized weakness and fall.  States she is weak in her legs and her arms.  States she did have feeling difficulty getting up.  States she has had to have her ex-husband get her up after these falls.  States her ex-husband told her was time to come to the ER.  Weak all over.  Some abdominal pain also.  No bowel movement for 3 days.  States this is somewhat unusual for her.  States she been on some medicines that usually had her have good bowel movements.  Is on chronic pain medicines however.  States her back always hurts her and that is not really any different.  Complaining of right shoulder pain from a fall.  States she did not hit her head or neck.    Past Medical History:  Diagnosis Date   Arthritis    hands and knees   Bipolar 1 disorder (HCC)    Borderline diabetic    Chronic back pain    Chronic neck pain    Complication of anesthesia    high anxiety-does not want to be alone   COPD (chronic obstructive pulmonary disease) (HCC)    Current use of estrogen therapy 01/12/2014   Depression    Diabetes mellitus without complication ([place])    Diplopia    Family history of adverse reaction to anesthesia    sister "gas in lungs"   GERD (gastroesophageal reflux disease)    Headache    Hot flashes 12/15/2013   Hypertension    IBS (irritable bowel syndrome)    Incontinence    Kidney stone    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HCC)    Panic attacks    Peptic ulcer    Peripheral neuropathy    Rosacea    Shingles    Sleep apnea    uses a  cpap-with oxygen    Home Medications Prior to Admission medications   Medication Sig Start Date End Date Taking? Authorizing Provider  albuterol (VENTOLIN HFA) 108 (90 Base) MCG/ACT inhaler Inhale 2 puffs into the lungs every 6 (six) hours as needed. 07/20/21  Yes [name] F, FNP  atorvastatin (LIPITOR) 40 MG tablet Take 1 tablet (40 mg total) by mouth daily. 03/29/21  Yes [name] F, FNP  azelastine (ASTELIN) 0.1 % nasal spray Place 1 spray into both nostrils 2 (two) times daily. Use in each nostril as directed 02/20/21  Yes Chesley Mires, MD  diazepam (VALIUM) 5 MG tablet Take 5 mg by mouth in the morning and at bedtime.    Yes [provider]  Dulaglutide (TRULICITY) 3 IR/4.8NI SOPN Inject 3 mg as directed once a week. DX: E11.65 07/24/21  Yes [name] F, FNP  DULoxetine (CYMBALTA) 60 MG capsule Take 1 capsule (60 mg total) by mouth daily. 05/21/21  Yes Marcial Pacas, MD  fluticasone (FLONASE) 50 MCG/ACT nasal spray USE 2 SPRAYS IN EACH NOSTRIL ONCE DAILY. 10/11/21  Yes [name] F, FNP  glipiZIDE (GLUCOTROL XL) 5 MG 24 hr tablet Take 1 tablet (5 mg  total) by mouth 2 (two) times daily with a meal. 07/13/21  Yes [name] F, FNP  JANUVIA 100 MG tablet TAKE (1) TABLET BY MOUTH ONCE DAILY. 10/03/21  Yes [name] F, FNP  lamoTRIgine (LAMICTAL) 200 MG tablet Take 200 mg by mouth at bedtime. For mood disorder.   Yes [provider]  lansoprazole (PREVACID) 30 MG capsule Take 1 capsule (30 mg total) by mouth daily at 12 noon. 10/04/20  Yes [name] F, FNP  lisinopril (ZESTRIL) 5 MG tablet Take 1 tablet (5 mg total) by mouth daily. 03/29/21  Yes [name] F, FNP  meclizine (ANTIVERT) 25 MG tablet Take 2 tablets (50 mg total) by mouth daily as needed for dizziness. 03/08/21  Yes [name] F, FNP  montelukast (SINGULAIR) 10 MG tablet TAKE (1) TABLET BY MOUTH AT BEDTIME. 03/29/21  Yes [name] F, FNP  oxybutynin (DITROPAN XL) 15 MG 24 hr tablet  Take 1 tablet (15 mg total) by mouth at bedtime. 09/07/21  Yes [name] F, FNP  oxyCODONE (ROXICODONE) 15 MG immediate release tablet Take 15 mg by mouth every 6 (six) hours as needed.   Yes [provider]  pregabalin (LYRICA) 150 MG capsule Take 1 capsule (150 mg total) by mouth 3 (three) times daily. 09/13/21  Yes [name] F, FNP  PREMARIN 0.3 MG tablet TAKE (1) TABLET BY MOUTH ONCE DAILY. 07/31/21  Yes Loman Brooklyn, FNP  promethazine (PHENERGAN) 12.5 MG tablet Take 1 tablet (12.5 mg total) by mouth every 8 (eight) hours as needed for nausea or vomiting. 08/09/21  Yes [name] F, FNP  diclofenac (VOLTAREN) 75 MG EC tablet Take 1 tablet (75 mg total) by mouth 2 (two) times daily. Patient not taking: Reported on 10/26/2021 09/13/21   Loman Brooklyn, FNP  glucose blood (CONTOUR NEXT TEST) test strip Test BS 4 times daily Dx E11.9 12/14/19   Loman Brooklyn, FNP  loratadine (CLARITIN) 10 MG tablet Take 1 tablet (10 mg total) by mouth daily. Patient not taking: Reported on 10/26/2021 05/28/21   Chesley Mires, MD  OneTouch Delica Lancets 31V MISC Use to test blood sugar twice daily. DX: E11.9 02/28/20   Loman Brooklyn, FNP  valACYclovir (VALTREX) 500 MG tablet TAKE (1) TABLET BY MOUTH ONCE DAILY AS NEEDED FOR SHINGLES FLARE UP. Patient not taking: Reported on 10/26/2021 07/06/21   Loman Brooklyn, FNP      Allergies    Chantix [varenicline], Divalproex sodium, Other, Pseudoeph-hydrocodone-gg, Sulfa antibiotics, Varenicline tartrate, Ambien [zolpidem], Clavulanic acid, Codeine, Elemental sulfur, Hydrocod poli-chlorphe poli er, Hydrocodone, Macrobid [nitrofurantoin], Paxil [paroxetine hcl], Prednisone, Amoxicillin, Farxiga [dapagliflozin], Metformin and related, and Tape    Review of Systems   Review of Systems  Constitutional:  Positive for activity change and fatigue. Negative for fever.  Respiratory:  Negative for shortness of breath.   Gastrointestinal:  Positive for  abdominal pain and constipation.  Genitourinary:  Negative for flank pain.  Musculoskeletal:  Positive for back pain.  Neurological:  Positive for weakness.  Psychiatric/Behavioral:  Negative for confusion.    Physical Exam Updated Vital Signs BP 105/69   Pulse 82   Temp 98 F (36.7 C) (Oral)   Resp 10   Ht '5\' 9"'$  (1.753 m)   Wt 78 kg   SpO2 92%   BMI 25.40 kg/m  Physical Exam Vitals and nursing note reviewed.  HENT:     Head: Normocephalic.  Eyes:     Pupils: Pupils are equal, round,  and reactive to light.  Cardiovascular:     Rate and Rhythm: Regular rhythm.  Abdominal:     Tenderness: There is abdominal tenderness.     Comments: Lower abdominal tenderness without rebound or guarding.  No hernia palpated.  Musculoskeletal:        General: Tenderness present.     Cervical back: Neck supple.     Comments: Tenderness to right proximal shoulder and potentially right clavicle.  Skin:    General: Skin is warm.     Capillary Refill: Capillary refill takes less than 2 seconds.  Neurological:     Mental Status: She is alert and oriented to person, place, and time.    ED Results / Procedures / Treatments   Labs (all labs ordered are listed, but only abnormal results are displayed) Labs Reviewed  CBC WITH DIFFERENTIAL/PLATELET - Abnormal; Notable for the following components:      Result Value   Abs Immature Granulocytes 0.08 (*)    All other components within normal limits  COMPREHENSIVE METABOLIC PANEL - Abnormal; Notable for the following components:   Glucose, Bld 133 (*)    All other components within normal limits  URINALYSIS, ROUTINE W REFLEX MICROSCOPIC - Abnormal; Notable for the following components:   Color, Urine STRAW (*)    Specific Gravity, Urine 1.003 (*)    Glucose, UA 50 (*)    All other components within normal limits  TSH    EKG EKG Interpretation  Date/Time:  Friday Oct 26 2021 08:45:56 EDT Ventricular Rate:  82 PR Interval:  165 QRS  Duration: 89 QT Interval:  355 QTC Calculation: 415 R Axis:   42 Text Interpretation: Sinus rhythm Confirmed by Davonna Belling [id number]) on 10/26/2021 9:47:33 AM  Radiology DG Chest 2 View  Result Date: 10/26/2021 CLINICAL DATA:  Pain after fall. EXAM: CHEST - 2 VIEW COMPARISON:  None Available. FINDINGS: The heart size and mediastinal contours are within normal limits. Both lungs are clear. Osteopenia and mild thoracic spondylosis. No appreciable fracture. IMPRESSION: No active cardiopulmonary disease. No appreciable fracture. Thoracic spondylosis. Electronically Signed   By: Keane Police D.O.   On: 10/26/2021 09:51   DG Shoulder Right  Result Date: 10/26/2021 CLINICAL DATA:  Fall EXAM: RIGHT SHOULDER - 2+ VIEW COMPARISON:  None Available. FINDINGS: There is no acute fracture or dislocation. Glenohumeral and acromioclavicular alignment is maintained. The joint spaces are preserved. The soft tissues are unremarkable. IMPRESSION: No acute fracture or dislocation. Electronically Signed   By: Valetta Mole M.D.   On: 10/26/2021 09:52   CT CHEST ABDOMEN PELVIS W CONTRAST  Result Date: 10/26/2021 CLINICAL DATA:  Lower abdominal pain with weakness x2 days. Fall 5 times in the past 2 days. EXAM: CT CHEST, ABDOMEN, AND PELVIS WITH CONTRAST TECHNIQUE: Multidetector CT imaging of the chest, abdomen and pelvis was performed following the standard protocol during bolus administration of intravenous contrast. RADIATION DOSE REDUCTION: This exam was performed according to the departmental dose-optimization program which includes automated exposure control, adjustment of the mA and/or kV according to patient size and/or use of iterative reconstruction technique. CONTRAST:  168m OMNIPAQUE IOHEXOL 300 MG/ML  SOLN COMPARISON:  CT April 30, 2019 FINDINGS: CT CHEST FINDINGS Cardiovascular: Aortic atherosclerosis without aneurysmal dilation of the thoracic aorta. No central pulmonary embolus on this nondedicated  study. Normal size heart. No significant pericardial effusion/thickening. Coronary artery calcifications. Mediastinum/Nodes: No suspicious thyroid nodule. No supraclavicular adenopathy. No mediastinal fluid or hematoma. No pathologically enlarged mediastinal, hilar or axillary lymph  nodes. Lungs/Pleura: Mild biapical pleuroparenchymal scarring. Hypoventilatory change in the lung bases. Atelectasis in the right middle lobe and lingula. No evidence of parenchymal contusion or laceration. No pleural effusion. No pneumothorax. Musculoskeletal: Multilevel degenerative changes spine. No acute osseous abnormality. CT ABDOMEN PELVIS FINDINGS Hepatobiliary: Mild hepatic steatosis with focal fatty sparing along the gallbladder fossa. No suspicious hepatic lesion. No hepatic injury or peripancreatic hematoma. Gallbladder is unremarkable. No biliary ductal dilation. Pancreas: No pancreatic ductal dilation, acute inflammation or evidence of laceration. Spleen: No splenic injury or perisplenic hematoma. Adrenals/Urinary Tract: No adrenal hemorrhage or renal injury identified. No hydronephrosis. Kidneys demonstrate symmetric enhancement and excretion of contrast material. Bladder is significantly distended extending above the sacral promontory. Stomach/Bowel: No radiopaque enteric contrast material was administered. Stomach is minimally distended limiting evaluation. There is no pathologic dilation of small or large bowel. Terminal ileum appears normal. Normal appendix. Large volume of formed stool throughout the colon suggestive of constipation. No evidence of acute bowel inflammation. Vascular/Lymphatic: Aortic atherosclerosis without aneurysmal dilation. No pathologically enlarged abdominal or pelvic lymph nodes. Reproductive: Status post hysterectomy. No adnexal masses. Other: No significant abdominopelvic free fluid. No abdominal wall herniation. Musculoskeletal: Multilevel degenerative changes spine. No acute osseous  abnormality. IMPRESSION: 1. No evidence of acute traumatic injury in the chest, abdomen, or pelvis. 2. Large volume of formed stool throughout the colon suggestive of constipation. 3. Significantly distended urinary bladder extending above the sacral promontory. Correlate for urinary retention. 4. Hepatic steatosis. 5.  Aortic Atherosclerosis (ICD10-I70.0). Electronically Signed   By: Dahlia Bailiff M.D.   On: 10/26/2021 11:51    Procedures Procedures    Medications Ordered in ED Medications  iohexol (OMNIPAQUE) 300 MG/ML solution 100 mL (100 mLs Intravenous Contrast Given 10/26/21 1128)    ED Course/ Medical Decision Making/ A&P                           Medical Decision Making Amount and/or Complexity of Data Reviewed External Data Reviewed: notes. Labs: ordered. Radiology: ordered and independent interpretation performed.    Details: Shoulder x-ray no fracture.  Risk Prescription drug management.  Patient presents with generalized weakness.  Increasing falls over the last 2 days.  States her legs feel like Jell-O.  States she cannot get up and walk.  Also abdomen more tender and distended.  Right shoulder pain.  X-ray independently interpreted showed no fracture in the shoulder.  CT scan done and showed some mild constipation but also a urinary retention.  Postvoid residual done and showed at least a liter of urine.  Foley catheter be placed.  However reviewing the scan it does not appears that there is a large amount of stool that would obstruct the bladder outlet.  Generalized weakness with difficulty raising her legs.  Also states feels weak in the arm.  With the weakness urinary retention and inability to get up and ambulate with repeated falls will admit to hospitalist for further evaluation.           Final Clinical Impression(s) / ED Diagnoses Final diagnoses:  Weakness  Urinary retention    Rx / DC Orders ED Discharge Orders     None         Davonna Belling, MD 10/26/21 1309

## 2021-10-26 NOTE — ED Notes (Signed)
Pt declined enema at this time.  States that she wants to wait until she is in a room upstairs.

## 2021-10-27 ENCOUNTER — Observation Stay (HOSPITAL_COMMUNITY): Payer: Medicare Other

## 2021-10-27 DIAGNOSIS — R2689 Other abnormalities of gait and mobility: Secondary | ICD-10-CM | POA: Diagnosis not present

## 2021-10-27 DIAGNOSIS — G4733 Obstructive sleep apnea (adult) (pediatric): Secondary | ICD-10-CM | POA: Diagnosis not present

## 2021-10-27 DIAGNOSIS — I700 Atherosclerosis of aorta: Secondary | ICD-10-CM | POA: Diagnosis not present

## 2021-10-27 DIAGNOSIS — Z79899 Other long term (current) drug therapy: Secondary | ICD-10-CM | POA: Diagnosis not present

## 2021-10-27 DIAGNOSIS — R296 Repeated falls: Secondary | ICD-10-CM | POA: Diagnosis not present

## 2021-10-27 DIAGNOSIS — K5900 Constipation, unspecified: Secondary | ICD-10-CM | POA: Diagnosis not present

## 2021-10-27 DIAGNOSIS — R10819 Abdominal tenderness, unspecified site: Secondary | ICD-10-CM | POA: Diagnosis not present

## 2021-10-27 DIAGNOSIS — G8929 Other chronic pain: Secondary | ICD-10-CM | POA: Diagnosis not present

## 2021-10-27 DIAGNOSIS — M48061 Spinal stenosis, lumbar region without neurogenic claudication: Secondary | ICD-10-CM | POA: Diagnosis not present

## 2021-10-27 DIAGNOSIS — K760 Fatty (change of) liver, not elsewhere classified: Secondary | ICD-10-CM | POA: Diagnosis not present

## 2021-10-27 DIAGNOSIS — M5440 Lumbago with sciatica, unspecified side: Secondary | ICD-10-CM

## 2021-10-27 DIAGNOSIS — J449 Chronic obstructive pulmonary disease, unspecified: Secondary | ICD-10-CM | POA: Diagnosis not present

## 2021-10-27 DIAGNOSIS — M47814 Spondylosis without myelopathy or radiculopathy, thoracic region: Secondary | ICD-10-CM | POA: Diagnosis not present

## 2021-10-27 DIAGNOSIS — R1030 Lower abdominal pain, unspecified: Secondary | ICD-10-CM | POA: Diagnosis not present

## 2021-10-27 DIAGNOSIS — F319 Bipolar disorder, unspecified: Secondary | ICD-10-CM | POA: Diagnosis not present

## 2021-10-27 DIAGNOSIS — R531 Weakness: Secondary | ICD-10-CM | POA: Diagnosis not present

## 2021-10-27 DIAGNOSIS — R269 Unspecified abnormalities of gait and mobility: Secondary | ICD-10-CM | POA: Diagnosis not present

## 2021-10-27 DIAGNOSIS — I10 Essential (primary) hypertension: Secondary | ICD-10-CM | POA: Diagnosis not present

## 2021-10-27 DIAGNOSIS — E1142 Type 2 diabetes mellitus with diabetic polyneuropathy: Secondary | ICD-10-CM | POA: Diagnosis not present

## 2021-10-27 DIAGNOSIS — M8588 Other specified disorders of bone density and structure, other site: Secondary | ICD-10-CM | POA: Diagnosis not present

## 2021-10-27 DIAGNOSIS — Z7984 Long term (current) use of oral hypoglycemic drugs: Secondary | ICD-10-CM | POA: Diagnosis not present

## 2021-10-27 DIAGNOSIS — K219 Gastro-esophageal reflux disease without esophagitis: Secondary | ICD-10-CM | POA: Diagnosis not present

## 2021-10-27 DIAGNOSIS — R339 Retention of urine, unspecified: Secondary | ICD-10-CM | POA: Diagnosis not present

## 2021-10-27 DIAGNOSIS — R29898 Other symptoms and signs involving the musculoskeletal system: Secondary | ICD-10-CM | POA: Diagnosis not present

## 2021-10-27 DIAGNOSIS — M545 Low back pain, unspecified: Secondary | ICD-10-CM | POA: Diagnosis not present

## 2021-10-27 DIAGNOSIS — M5136 Other intervertebral disc degeneration, lumbar region: Secondary | ICD-10-CM | POA: Diagnosis not present

## 2021-10-27 DIAGNOSIS — Z87891 Personal history of nicotine dependence: Secondary | ICD-10-CM | POA: Diagnosis not present

## 2021-10-27 DIAGNOSIS — Z79891 Long term (current) use of opiate analgesic: Secondary | ICD-10-CM | POA: Diagnosis not present

## 2021-10-27 DIAGNOSIS — E1140 Type 2 diabetes mellitus with diabetic neuropathy, unspecified: Secondary | ICD-10-CM | POA: Diagnosis not present

## 2021-10-27 DIAGNOSIS — N3289 Other specified disorders of bladder: Secondary | ICD-10-CM | POA: Diagnosis not present

## 2021-10-27 DIAGNOSIS — M25511 Pain in right shoulder: Secondary | ICD-10-CM | POA: Diagnosis not present

## 2021-10-27 DIAGNOSIS — R338 Other retention of urine: Secondary | ICD-10-CM | POA: Diagnosis not present

## 2021-10-27 LAB — GLUCOSE, CAPILLARY
Glucose-Capillary: 214 mg/dL — ABNORMAL HIGH (ref 70–99)
Glucose-Capillary: 168 mg/dL — ABNORMAL HIGH (ref 70–99)
Glucose-Capillary: 244 mg/dL — ABNORMAL HIGH (ref 70–99)
Glucose-Capillary: 199 mg/dL — ABNORMAL HIGH (ref 70–99)

## 2021-10-27 LAB — BASIC METABOLIC PANEL
Anion gap: 6 (ref 5–15)
BUN: 10 mg/dL (ref 8–23)
CO2: 31 mmol/L (ref 22–32)
Calcium: 9.5 mg/dL (ref 8.9–10.3)
Chloride: 101 mmol/L (ref 98–111)
Creatinine, Ser: 0.75 mg/dL (ref 0.44–1.00)
GFR, Estimated: >60 mL/min (ref >60)
Glucose, Bld: 156 mg/dL — ABNORMAL HIGH (ref 70–99)
Potassium: 4 mmol/L (ref 3.5–5.1)
Sodium: 138 mmol/L (ref 135–145)

## 2021-10-27 LAB — CBC
HCT: 43.2 % (ref 36.0–46.0)
Hemoglobin: 14.5 g/dL (ref 12.0–15.0)
MCH: 32.4 pg (ref 26.0–34.0)
MCHC: 33.6 g/dL (ref 30.0–36.0)
MCV: 96.6 fL (ref 80.0–100.0)
Platelets: 201 10*3/uL (ref 150–400)
RBC: 4.47 MIL/uL (ref 3.87–5.11)
RDW: 13 % (ref 11.5–15.5)
WBC: 7.4 10*3/uL (ref 4.0–10.5)
nRBC: 0 % (ref 0.0–0.2)

## 2021-10-27 LAB — MAGNESIUM: Magnesium: 2.1 mg/dL (ref 1.7–2.4)

## 2021-10-27 MED ORDER — VITAMIN D 25 MCG (1000 UNIT) PO TABS
1000.0000 [IU] | ORAL_TABLET | Freq: Every day | ORAL | Status: DC
  Administered 2021-10-28 – 2021-10-31 (×4): 1000 [IU] via ORAL
  Filled 2021-10-27 (×4): qty 1

## 2021-10-27 MED ORDER — OXYCODONE HCL 5 MG PO TABS
5.0000 mg | ORAL_TABLET | Freq: Four times a day (QID) | ORAL | Status: DC | PRN
  Administered 2021-10-27: 15 mg via ORAL
  Administered 2021-10-27 – 2021-10-28 (×3): 5 mg via ORAL
  Administered 2021-10-28: 15 mg via ORAL
  Administered 2021-10-29: 10 mg via ORAL
  Administered 2021-10-29: 15 mg via ORAL
  Administered 2021-10-29: 10 mg via ORAL
  Administered 2021-10-29 – 2021-10-30 (×3): 15 mg via ORAL
  Administered 2021-10-31: 5 mg via ORAL
  Filled 2021-10-27: qty 2
  Filled 2021-10-27: qty 3
  Filled 2021-10-27: qty 2
  Filled 2021-10-27 (×2): qty 1
  Filled 2021-10-27 (×2): qty 3
  Filled 2021-10-27: qty 1
  Filled 2021-10-27: qty 2
  Filled 2021-10-27: qty 1
  Filled 2021-10-27: qty 3
  Filled 2021-10-27: qty 2
  Filled 2021-10-27 (×2): qty 3

## 2021-10-27 MED ORDER — ATORVASTATIN CALCIUM 40 MG PO TABS
40.0000 mg | ORAL_TABLET | Freq: Every day | ORAL | Status: DC
  Administered 2021-10-27 – 2021-10-31 (×5): 40 mg via ORAL
  Filled 2021-10-27 (×5): qty 1

## 2021-10-27 MED ORDER — VITAMIN D (ERGOCALCIFEROL) 1.25 MG (50000 UNIT) PO CAPS
50000.0000 [IU] | ORAL_CAPSULE | Freq: Once | ORAL | Status: AC
  Administered 2021-10-27: 50000 [IU] via ORAL
  Filled 2021-10-27: qty 1

## 2021-10-27 MED ORDER — CHLORHEXIDINE GLUCONATE CLOTH 2 % EX PADS
6.0000 | MEDICATED_PAD | Freq: Every day | CUTANEOUS | Status: DC
  Administered 2021-10-27 – 2021-10-28 (×2): 6 via TOPICAL

## 2021-10-27 MED ORDER — VITAMIN B-12 1000 MCG PO TABS
1000.0000 ug | ORAL_TABLET | Freq: Every day | ORAL | Status: DC
  Administered 2021-10-27 – 2021-10-31 (×5): 1000 ug via ORAL
  Filled 2021-10-27 (×5): qty 1

## 2021-10-27 NOTE — Evaluation (Signed)
Physical Therapy Evaluation Patient Details Name: Cynthia Deleon MRN: [id number] DOB: 29-May-1956 Today's Date: 10/27/2021  History of Present Illness  Patient is a 65 years old female with past medical history of bipolar disorder, chronic back pain, COPD, depression, diabetes mellitus, GERD, hypertension, peripheral neuropathy presented to hospital with complaints of generalized weakness and multiple falls of around 5 times in the last 2 days.  Patient lives by self at home.  She also complained of constipation for the last 4 days with some abdominal discomfort but denied any nausea or vomiting.  She however complained of shoulder pain from recent fall with some bruising over the right elbow.  She however denied any trauma to the head or loss of consciousness. she complains of generalized weakness in her legs and arms and difficulty getting up.  Her ex-husband had been helping her some due to her weakness and falls.  Patient does have history of chronic back pain and is on chronic pain medications.  Patient denies any fever, chills or rigor.  Denies any shortness of breath or chest pain.   Clinical Impression  Patient limited for functional mobility as stated below secondary to BLE weakness, fatigue and poor standing balance. Patient apprehensive with transfer to standing but does complete with HHA and is unsteady upon standing. Patient able to ambulate several steps with HHA but is very unsteady and ambulates with small steps. Gait improves with use of RW but continues to be slow, unsteady and requires cueing for proper RW use. Patient unable to maintain standing balance without assist/support and overall appears to be at a high risk for further falls. Patient will benefit from continued physical therapy in hospital and recommended venue below to increase strength, balance, endurance for safe ADLs and gait.        Recommendations for follow up therapy are one component of a multi-disciplinary  discharge planning process, led by the attending physician.  Recommendations may be updated based on patient status, additional functional criteria and insurance authorization.  Follow Up Recommendations Skilled nursing-short term rehab (<3 hours/day)    Assistance Recommended at Discharge Intermittent Supervision/Assistance  Patient can return home with the following  A little help with walking and/or transfers;A little help with bathing/dressing/bathroom;Assistance with cooking/housework;Assist for transportation;Help with stairs or ramp for entrance    Equipment Recommendations None recommended by PT  Recommendations for Other Services       Functional Status Assessment Patient has had a recent decline in their functional status and demonstrates the ability to make significant improvements in function in a reasonable and predictable amount of time.     Precautions / Restrictions Precautions Precautions: Fall Restrictions Weight Bearing Restrictions: No      Mobility  Bed Mobility Overal bed mobility: Needs Assistance Bed Mobility: Supine to Sit, Sit to Supine     Supine to sit: Min assist, HOB elevated Sit to supine: Min assist   General bed mobility comments: assist to pull to seated EOB    Transfers Overall transfer level: Needs assistance Equipment used: 1 person hand held assist Transfers: Sit to/from Stand Sit to Stand: Min assist, Mod assist           General transfer comment: slow, labored, unsteady, patient apprehensive with fear of falling    Ambulation/Gait Ambulation/Gait assistance: Min assist, Mod assist Gait Distance (Feet): 15 Feet Assistive device: Rolling walker (2 wheels) Gait Pattern/deviations: Step-through pattern, Decreased stride length, Narrow base of support Gait velocity: decreased     General Gait Details:  labored, unsteady, BLE trembling; able to take very unsteady steps without AD with HHA, gait improves with use of RW but still  unsteady with intermittent rest breaks and cueing for proper RW use  Stairs            Wheelchair Mobility    Modified Rankin (Stroke Patients Only)       Balance Overall balance assessment: Needs assistance Sitting-balance support: Feet supported, No upper extremity supported Sitting balance-Leahy Scale: Good Sitting balance - Comments: seated EOB   Standing balance support: Bilateral upper extremity supported, Reliant on assistive device for balance Standing balance-Leahy Scale: Poor Standing balance comment: fair/poor with RW                             Pertinent Vitals/Pain Pain Assessment Pain Assessment: 0-10 Pain Score: 6  Pain Location: R shoulder Pain Descriptors / Indicators: Sore Pain Intervention(s): Limited activity within patient's tolerance, Monitored during session, Repositioned    Home Living Family/patient expects to be discharged to:: Private residence Living Arrangements: Spouse/significant other Available Help at Discharge: Family Type of Home: House Home Access: Level entry       Home Layout: One level Home Equipment: None      Prior Function Prior Level of Function : Independent/Modified Independent             Mobility Comments: states community ambualtion without AD until recently with falls 5x ADLs Comments: independent     Hand Dominance        Extremity/Trunk Assessment   Upper Extremity Assessment Upper Extremity Assessment: Generalized weakness    Lower Extremity Assessment Lower Extremity Assessment: Generalized weakness    Cervical / Trunk Assessment Cervical / Trunk Assessment: Normal  Communication   Communication: No difficulties  Cognition Arousal/Alertness: Awake/alert Behavior During Therapy: WFL for tasks assessed/performed Overall Cognitive Status: Within Functional Limits for tasks assessed                                          General Comments      Exercises      Assessment/Plan    PT Assessment Patient needs continued PT services  PT Problem List Decreased strength;Decreased mobility;Decreased activity tolerance;Decreased balance;Decreased knowledge of use of DME       PT Treatment Interventions DME instruction;Therapeutic exercise;Gait training;Balance training;Stair training;Neuromuscular re-education;Functional mobility training;Therapeutic activities;Patient/family education    PT Goals (Current goals can be found in the Care Plan section)  Acute Rehab PT Goals Patient Stated Goal: stop falling PT Goal Formulation: With patient Time For Goal Achievement: 11/10/21 Potential to Achieve Goals: Good    Frequency Min 3X/week     Co-evaluation               AM-PAC PT "6 Clicks" Mobility  Outcome Measure Help needed turning from your back to your side while in a flat bed without using bedrails?: None Help needed moving from lying on your back to sitting on the side of a flat bed without using bedrails?: A Little Help needed moving to and from a bed to a chair (including a wheelchair)?: A Lot Help needed standing up from a chair using your arms (e.g., wheelchair or bedside chair)?: A Lot Help needed to walk in hospital room?: A Lot Help needed climbing 3-5 steps with a railing? : A Lot 6 Click Score: 15  End of Session Equipment Utilized During Treatment: Gait belt Activity Tolerance: Patient tolerated treatment well;Patient limited by fatigue Patient left: in bed;with call bell/phone within reach Nurse Communication: Mobility status PT Visit Diagnosis: Unsteadiness on feet (R26.81);Other abnormalities of gait and mobility (R26.89);Repeated falls (R29.6);Muscle weakness (generalized) (M62.81)    Time: 2585-2778 PT Time Calculation (min) (ACUTE ONLY): 20 min   Charges:   PT Evaluation $PT Eval Moderate Complexity: 1 Mod PT Treatments $Therapeutic Activity: 8-22 mins       11:19 AM, 10/27/21 Mearl Latin PT,  DPT Physical Therapist at [facility]

## 2021-10-27 NOTE — Plan of Care (Signed)
  Problem: Acute Rehab PT Goals(only PT should resolve) Goal: Patient Will Transfer Sit To/From Stand Outcome: Progressing Flowsheets (Taken 10/27/2021 1119) Patient will transfer sit to/from stand:  with min guard assist  with minimal assist Goal: Pt Will Transfer Bed To Chair/Chair To Bed Outcome: Progressing Flowsheets (Taken 10/27/2021 1119) Pt will Transfer Bed to Chair/Chair to Bed:  min guard assist  with min assist Goal: Pt Will Ambulate Outcome: Progressing Flowsheets (Taken 10/27/2021 1119) Pt will Ambulate:  50 feet  with min guard assist  with minimal assist Goal: Pt/caregiver will Perform Home Exercise Program Outcome: Progressing Flowsheets (Taken 10/27/2021 1119) Pt/caregiver will Perform Home Exercise Program:  For increased strengthening  For improved balance  Independently  11:20 AM, 10/27/21 Mearl Latin PT, DPT Physical Therapist at [facility]

## 2021-10-27 NOTE — Progress Notes (Signed)
RN called due to patient having ongoing weakness in legs and her arms with worsening symptoms since admission yesterday.  Patient was reported to have had about 5 falls in 2 days.  At bedside, she was alert, awake and oriented x3, though she was slightly drowsy and she presented with pupil constriction.  Chart was reviewed and CT of the lumbar spine to rule out possible fracture due to recent multiple falls was indicated.   I agreed with holding diazepam at this time due to increased risk of fall on combining with opioids.  Patient will be evaluated this morning by physical therapy and we shall await further recommendations.  Consider MRI of lumbar spine for any worsening symptoms such as herniated intervertebral disc with nerve compression, or a major neurological problem.  Total time:  18  minutes This includes time reviewing the chart including progress notes, labs, EKGs, taking medical decisions, ordering labs and documenting findings.

## 2021-10-27 NOTE — Progress Notes (Signed)
PROGRESS NOTE    Cynthia Deleon  GUR:[id number] DOB: [birth year] DOA: 10/26/2021 PCP: Loman Brooklyn, FNP    Brief Narrative:  Patient is a 66 years old female with past medical history of bipolar disorder, chronic back pain, COPD, depression, diabetes mellitus, GERD, hypertension, peripheral neuropathy presented to hospital with complaints of generalized weakness and multiple falls of around 5 times in the last 2 days.  Patient lives by self at home.  She also complained of constipation for the last 4 days with some abdominal discomfort but denied any nausea or vomiting.  She however complained of shoulder pain from recent fall with some bruising over the right elbow.  She however denied any trauma to the head or loss of consciousness. she complains of generalized weakness in her legs and arms and difficulty getting up.  Her ex-husband had been helping her some due to her weakness and falls.  Patient does have history of chronic back pain and is on chronic pain medications.  Patient denies any fever, chills or rigor.  Denies any shortness of breath or chest pain.  In the ED, patient had stable vitals.  Had mild abdominal tenderness.  Urinalysis was negative for infection.  TSH of 0.4.  Potassium of 4.1.  Creatinine at 0.7.  LFTs within normal limits.  CBC was unremarkable.  Due to frequent falls and generalized weakness and concerns for safety at home with constipation patient was considered for observation in the hospital for further evaluation.  Assessment and plan.  Principal Problem:   Falls frequently Active Problems:   Current use of estrogen therapy   Degeneration of lumbar intervertebral disc   Essential hypertension   COPD (chronic obstructive pulmonary disease) (HCC)   GERD (gastroesophageal reflux disease)   Polyneuropathy due to type 2 diabetes mellitus (HCC)   Gait abnormality   Bipolar 1 disorder (HCC)   Constipation   Acute urinary retention   Generalized weakness,  multiple falls with ambulatory dysfunction.   Laboratory data unremarkable.  Patient does have history of neuropathy.  Check vitamin B12 at 320, vitamin D at 32.  CK levels within normal limits.  Will replenish vitamin B12 and vitamin D.  TSH within normal limits.  Patient was seen by physical therapy recommended skilled nursing facility placement at this time.  On oxycodone and diazepam as outpatient.  We will continue oxycodone.  Hold diazepam for now.   Patient has multiple medications and there is concern for polypharmacy .  Fall precautions.  Lumbar spine x-ray showed osteopenia with degeneration largely stable from 2020 MRI.  Chronic disc degeneration at the L5-S1 with retrolisthesis with moderate to severe bilateral L5 foraminal stenosis.  Would benefit from aggressive rehabilitation.  Abdominal discomfort, constipation.  CT scan of the abdomen and pelvis showed some evidence of constipation.  We will continue with bowel regimen including suppository as needed.  Had bowel movement yesterday.  Acute urinary retention. On foley catheter, focus on bowel regimen.  We will hold off with oxybutynin from home.  We will do voiding trial in a.m.  Chronic back pain, degenerative joint disease. Chronically on narcotics as outpatient with oxycodone 15 mg every 6 hours. Patient follows up with pain management clinic as outpatient.   COPD Continue albuterol inhaler.  Diabetes mellitus with peripheral neuropathy. On Trulicity, Januvia and glipizide as outpatient.  We will hold.  Continue with sliding scale insulin while in the hospital.  Continue Lyrica.  Diabetic diet.  Closely monitor blood glucose levels  Bipolar disorder  depression.  On duloxetine, lamotrigine, Lyrica.  Continue while in hospital  GERD/history of peptic ulcer disease. Continue Protonix.  Essential hypertension On lisinopril as outpatient.  Hold for today.  Reassess in a.m.     DVT prophylaxis: enoxaparin (LOVENOX) injection 40  mg Start: 10/26/21 1415   Code Status:     Code Status: Full Code  Disposition: Skilled nursing facility as per PT evaluation.  Status is: Observation  The patient will require care spanning > 2 midnights and should be moved to inpatient because: Ambulatory dysfunction, falls, possible need for rehabitation.   Family Communication: None today  Consultants:  Physical therapy  Procedures:  None  Antimicrobials:  None  Anti-infectives (From admission, onward)    None      Subjective:  Today, patient was seen and examined at bedside.  There was some concern for ongoing weakness in the legs and arms since admission yesterday and was seen by ED provider in the nighttime to have an order for CT lumbar spine.  Patient denies any shortness of breath cough fever chills or rigor.  Objective: Vitals:   10/26/21 2231 10/27/21 0203 10/27/21 0500 10/27/21 0508  BP: 108/61 (!) 122/51  108/76  Pulse: 69 72  82  Resp: '18 19  20  '$ Temp: 98.5 F (36.9 C) 98 F (36.7 C)  98.5 F (36.9 C)  TempSrc:      SpO2: 94% 96%  (!) 89%  Weight: 79.8 kg  79.5 kg   Height:        Intake/Output Summary (Last 24 hours) at 10/27/2021 1143 Last data filed at 10/27/2021 1018 Gross per 24 hour  Intake 240 ml  Output 3000 ml  Net -2760 ml   Filed Weights   10/26/21 0839 10/26/21 2231 10/27/21 0500  Weight: 78 kg 79.8 kg 79.5 kg   Body mass index is 25.88 kg/m.   Physical Examination: General:  Average built, not in obvious distress HENT:   No scleral pallor or icterus noted. Oral mucosa is moist.  Chest:  Clear breath sounds.  Diminished breath sounds bilaterally. No crackles or wheezes.  CVS: S1 &S2 heard. No murmur.  Regular rate and rhythm. Abdomen: Soft, nontender, nondistended.  Bowel sounds are heard.  Foley catheter in place. Extremities: No cyanosis, clubbing or edema.  Peripheral pulses are palpable. Psych: Alert, awake and oriented, normal mood CNS:  No cranial nerve deficits.   Moves bilateral lower extremities against gravity and force. Skin: Warm and dry.  No rashes noted.  Data Reviewed:   CBC: Recent Labs  Lab 10/26/21 0900 10/27/21 0522  WBC 8.1 7.4  NEUTROABS 5.4  --   HGB 14.9 14.5  HCT 45.6 43.2  MCV 96.0 96.6  PLT 216 510    Basic Metabolic Panel: Recent Labs  Lab 10/26/21 0900 10/27/21 0522  NA 141 138  K 4.1 4.0  CL 107 101  CO2 28 31  GLUCOSE 133* 156*  BUN 8 10  CREATININE 0.70 0.75  CALCIUM 9.2 9.5  MG  --  2.1    Liver Function Tests: Recent Labs  Lab 10/26/21 0900  AST 19  ALT 14  ALKPHOS 63  BILITOT 0.5  PROT 6.5  ALBUMIN 4.1     Radiology Studies: DG Chest 2 View  Result Date: 10/26/2021 CLINICAL DATA:  Pain after fall. EXAM: CHEST - 2 VIEW COMPARISON:  None Available. FINDINGS: The heart size and mediastinal contours are within normal limits. Both lungs are clear. Osteopenia and mild thoracic spondylosis.  No appreciable fracture. IMPRESSION: No active cardiopulmonary disease. No appreciable fracture. Thoracic spondylosis. Electronically Signed   By: Keane Police D.O.   On: 10/26/2021 09:51   DG Shoulder Right  Result Date: 10/26/2021 CLINICAL DATA:  Fall EXAM: RIGHT SHOULDER - 2+ VIEW COMPARISON:  None Available. FINDINGS: There is no acute fracture or dislocation. Glenohumeral and acromioclavicular alignment is maintained. The joint spaces are preserved. The soft tissues are unremarkable. IMPRESSION: No acute fracture or dislocation. Electronically Signed   By: Valetta Mole M.D.   On: 10/26/2021 09:52   CT HEAD WO CONTRAST (5MM)  Result Date: 10/27/2021 CLINICAL DATA:  66 year old female with frequent falls. Weakness. EXAM: CT HEAD WITHOUT CONTRAST TECHNIQUE: Contiguous axial images were obtained from the base of the skull through the vertex without intravenous contrast. RADIATION DOSE REDUCTION: This exam was performed according to the departmental dose-optimization program which includes automated exposure  control, adjustment of the mA and/or kV according to patient size and/or use of iterative reconstruction technique. COMPARISON:  Brain MRI 03/28/2020. Head CT 05/29/2017. FINDINGS: Brain: Cerebral volume remains normal for age. No midline shift, ventriculomegaly, mass effect, evidence of mass lesion, intracranial hemorrhage or evidence of cortically based acute infarction. Gray-white matter differentiation is stable and normal for age. No encephalomalacia identified. Vascular: Calcified atherosclerosis at the skull base. Skull: Chronic osteopenia.  No acute osseous abnormality identified. Sinuses/Orbits: Tympanic cavity and Visualized paranasal sinuses and mastoids are stable and well aerated. Other: No acute orbit or scalp soft tissue finding. IMPRESSION: Stable and normal for age non contrast CT appearance of the brain. No recent traumatic injury identified. Electronically Signed   By: Genevie Ann M.D.   On: 10/27/2021 05:05   CT LUMBAR SPINE WO CONTRAST  Result Date: 10/27/2021 CLINICAL DATA:  66 year old female with low back pain. Extremity weakness. Multiple recent falls. EXAM: CT LUMBAR SPINE WITHOUT CONTRAST TECHNIQUE: Multidetector CT imaging of the lumbar spine was performed without intravenous contrast administration. Multiplanar CT image reconstructions were also generated. RADIATION DOSE REDUCTION: This exam was performed according to the departmental dose-optimization program which includes automated exposure control, adjustment of the mA and/or kV according to patient size and/or use of iterative reconstruction technique. COMPARISON:  Lumbar MRI 01/01/2019. CT Chest, Abdomen, and Pelvis 10/26/2021. FINDINGS: Segmentation: Normal, the same numbering system used on the 2020 MRI. Alignment: Mildly improved lumbar lordosis from the previous MRI, less straightening. Mild chronic degenerative appearing retrolisthesis of L5 on S1 is stable. Vertebrae: Diffuse osteopenia. Visible lower thoracic levels appear  intact. 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 Lumbar levels appear intact. Visible sacrum and SI joints appear intact. No acute osseous abnormality identified. Paraspinal and other soft tissues: Respiratory motion at the lung bases. Aortoiliac calcified atherosclerosis. Normal caliber abdominal aorta. Vicarious contrast excretion into the gallbladder. Negative visible other noncontrast abdominal and pelvic viscera. Negative lumbar paraspinal soft tissues. Disc levels: Capacious spinal canal. Lumbar spine degeneration largely stable from the 2020 MRI, notable for vacuum disc, foraminal disc osteophyte complex and mild facet hypertrophy in association with chronic L5-S1 retrolisthesis. However, there is a new left paracentral focus of gas in the ventral epidural space series 4, image 123 near the descending left S1 nerve root in the lateral recess. But no spinal stenosis. Mild to moderate left and moderate to severe right L5 neural foraminal stenosis appears chronic and stable. IMPRESSION: 1. Osteopenia. No acute osseous abnormality in the lumbar spine. 2. Lumbar spine degeneration is generally mild for age and largely stable from a  2020 MRI, with capacious spinal canal and no evidence of spinal stenosis. However, chronic disc degeneration at L5-S1 in conjunction with retrolisthesis does appear progressed with evidence of a small disc herniation near the descending left S1 nerve roots in the lateral recess. Query left S1 radiculitis. 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 Electronically Signed   By: Genevie Ann M.D.   On: 10/27/2021 09:41   CT CHEST ABDOMEN PELVIS W CONTRAST  Result Date: 10/26/2021 CLINICAL DATA:  Lower abdominal pain with weakness x2 days. Fall 5 times in the past 2 days. EXAM: CT CHEST, ABDOMEN, AND PELVIS WITH CONTRAST TECHNIQUE: Multidetector CT imaging of the chest, abdomen and pelvis was performed following the standard protocol  during bolus administration of intravenous contrast. RADIATION DOSE REDUCTION: This exam was performed according to the departmental dose-optimization program which includes automated exposure control, adjustment of the mA and/or kV according to patient size and/or use of iterative reconstruction technique. CONTRAST:  159m OMNIPAQUE IOHEXOL 300 MG/ML  SOLN COMPARISON:  CT April 30, 2019 FINDINGS: CT CHEST FINDINGS Cardiovascular: Aortic atherosclerosis without aneurysmal dilation of the thoracic aorta. No central pulmonary embolus on this nondedicated study. Normal size heart. No significant pericardial effusion/thickening. Coronary artery calcifications. Mediastinum/Nodes: No suspicious thyroid nodule. No supraclavicular adenopathy. No mediastinal fluid or hematoma. No pathologically enlarged mediastinal, hilar or axillary lymph nodes. Lungs/Pleura: Mild biapical pleuroparenchymal scarring. Hypoventilatory change in the lung bases. Atelectasis in the right middle lobe and lingula. No evidence of parenchymal contusion or laceration. No pleural effusion. No pneumothorax. Musculoskeletal: Multilevel degenerative changes spine. No acute osseous abnormality. CT ABDOMEN PELVIS FINDINGS Hepatobiliary: Mild hepatic steatosis with focal fatty sparing along the gallbladder fossa. No suspicious hepatic lesion. No hepatic injury or peripancreatic hematoma. Gallbladder is unremarkable. No biliary ductal dilation. Pancreas: No pancreatic ductal dilation, acute inflammation or evidence of laceration. Spleen: No splenic injury or perisplenic hematoma. Adrenals/Urinary Tract: No adrenal hemorrhage or renal injury identified. No hydronephrosis. Kidneys demonstrate symmetric enhancement and excretion of contrast material. Bladder is significantly distended extending above the sacral promontory. Stomach/Bowel: No radiopaque enteric contrast material was administered. Stomach is minimally distended limiting evaluation. There is no  pathologic dilation of small or large bowel. Terminal ileum appears normal. Normal appendix. Large volume of formed stool throughout the colon suggestive of constipation. No evidence of acute bowel inflammation. Vascular/Lymphatic: Aortic atherosclerosis without aneurysmal dilation. No pathologically enlarged abdominal or pelvic lymph nodes. Reproductive: Status post hysterectomy. No adnexal masses. Other: No significant abdominopelvic free fluid. No abdominal wall herniation. Musculoskeletal: Multilevel degenerative changes spine. No acute osseous abnormality. IMPRESSION: 1. No evidence of acute traumatic injury in the chest, abdomen, or pelvis. 2. Large volume of formed stool throughout the colon suggestive of constipation. 3. Significantly distended urinary bladder extending above the sacral promontory. Correlate for urinary retention. 4. Hepatic steatosis. 5.  Aortic Atherosclerosis (ICD10-I70.0). Electronically Signed   By: JDahlia BailiffM.D.   On: 10/26/2021 11:51      LOS: 0 days    LFlora Lipps MD [facility] Available via Epic secure chat 7am-7pm After these hours, please refer to coverage provider listed on [email or web address] 10/27/2021, 11:43 AM

## 2021-10-27 NOTE — Progress Notes (Signed)
On call at bedside to evaluate pt, for on going weakness in legs and arms,and after CT scan will continue to monitor

## 2021-10-28 DIAGNOSIS — I700 Atherosclerosis of aorta: Secondary | ICD-10-CM | POA: Diagnosis not present

## 2021-10-28 DIAGNOSIS — G8929 Other chronic pain: Secondary | ICD-10-CM | POA: Diagnosis not present

## 2021-10-28 DIAGNOSIS — R339 Retention of urine, unspecified: Secondary | ICD-10-CM | POA: Diagnosis not present

## 2021-10-28 DIAGNOSIS — M25511 Pain in right shoulder: Secondary | ICD-10-CM | POA: Diagnosis not present

## 2021-10-28 DIAGNOSIS — J449 Chronic obstructive pulmonary disease, unspecified: Secondary | ICD-10-CM | POA: Diagnosis not present

## 2021-10-28 DIAGNOSIS — R531 Weakness: Secondary | ICD-10-CM

## 2021-10-28 DIAGNOSIS — K760 Fatty (change of) liver, not elsewhere classified: Secondary | ICD-10-CM | POA: Diagnosis not present

## 2021-10-28 DIAGNOSIS — M8588 Other specified disorders of bone density and structure, other site: Secondary | ICD-10-CM | POA: Diagnosis not present

## 2021-10-28 DIAGNOSIS — E1142 Type 2 diabetes mellitus with diabetic polyneuropathy: Secondary | ICD-10-CM | POA: Diagnosis not present

## 2021-10-28 DIAGNOSIS — E1140 Type 2 diabetes mellitus with diabetic neuropathy, unspecified: Secondary | ICD-10-CM | POA: Diagnosis not present

## 2021-10-28 DIAGNOSIS — G4733 Obstructive sleep apnea (adult) (pediatric): Secondary | ICD-10-CM | POA: Diagnosis not present

## 2021-10-28 DIAGNOSIS — Z79899 Other long term (current) drug therapy: Secondary | ICD-10-CM | POA: Diagnosis not present

## 2021-10-28 DIAGNOSIS — I10 Essential (primary) hypertension: Secondary | ICD-10-CM | POA: Diagnosis not present

## 2021-10-28 DIAGNOSIS — K219 Gastro-esophageal reflux disease without esophagitis: Secondary | ICD-10-CM | POA: Diagnosis not present

## 2021-10-28 DIAGNOSIS — M47814 Spondylosis without myelopathy or radiculopathy, thoracic region: Secondary | ICD-10-CM | POA: Diagnosis not present

## 2021-10-28 DIAGNOSIS — N3289 Other specified disorders of bladder: Secondary | ICD-10-CM | POA: Diagnosis not present

## 2021-10-28 DIAGNOSIS — K5900 Constipation, unspecified: Secondary | ICD-10-CM | POA: Diagnosis not present

## 2021-10-28 DIAGNOSIS — Z79891 Long term (current) use of opiate analgesic: Secondary | ICD-10-CM | POA: Diagnosis not present

## 2021-10-28 DIAGNOSIS — Z7984 Long term (current) use of oral hypoglycemic drugs: Secondary | ICD-10-CM | POA: Diagnosis not present

## 2021-10-28 DIAGNOSIS — R338 Other retention of urine: Secondary | ICD-10-CM | POA: Diagnosis not present

## 2021-10-28 DIAGNOSIS — R1030 Lower abdominal pain, unspecified: Secondary | ICD-10-CM | POA: Diagnosis not present

## 2021-10-28 DIAGNOSIS — Z87891 Personal history of nicotine dependence: Secondary | ICD-10-CM | POA: Diagnosis not present

## 2021-10-28 DIAGNOSIS — R2689 Other abnormalities of gait and mobility: Secondary | ICD-10-CM | POA: Diagnosis not present

## 2021-10-28 DIAGNOSIS — R262 Difficulty in walking, not elsewhere classified: Secondary | ICD-10-CM

## 2021-10-28 DIAGNOSIS — F319 Bipolar disorder, unspecified: Secondary | ICD-10-CM | POA: Diagnosis not present

## 2021-10-28 DIAGNOSIS — R296 Repeated falls: Secondary | ICD-10-CM | POA: Diagnosis not present

## 2021-10-28 DIAGNOSIS — R10819 Abdominal tenderness, unspecified site: Secondary | ICD-10-CM | POA: Diagnosis not present

## 2021-10-28 LAB — GLUCOSE, CAPILLARY
Glucose-Capillary: 142 mg/dL — ABNORMAL HIGH (ref 70–99)
Glucose-Capillary: 197 mg/dL — ABNORMAL HIGH (ref 70–99)
Glucose-Capillary: 203 mg/dL — ABNORMAL HIGH (ref 70–99)
Glucose-Capillary: 268 mg/dL — ABNORMAL HIGH (ref 70–99)

## 2021-10-28 MED ORDER — SIMETHICONE 80 MG PO CHEW: 80.0000 mg | CHEWABLE_TABLET | Freq: Four times a day (QID) | ORAL | Status: DC | PRN

## 2021-10-28 MED ORDER — POLYETHYLENE GLYCOL 3350 17 G PO PACK
17.0000 g | PACK | Freq: Two times a day (BID) | ORAL | Status: DC
  Administered 2021-10-28 – 2021-10-30 (×5): 17 g via ORAL
  Filled 2021-10-28 (×6): qty 1

## 2021-10-28 MED ORDER — BISACODYL 10 MG RE SUPP
10.0000 mg | Freq: Once | RECTAL | Status: AC
Start: 2021-10-28 — End: 2021-10-28
  Administered 2021-10-28: 10 mg via RECTAL
  Filled 2021-10-28: qty 1

## 2021-10-28 NOTE — Progress Notes (Signed)
PROGRESS NOTE    Cynthia Deleon  GUY:[id number] DOB: 01-23-1956 DOA: 10/26/2021 PCP: Loman Brooklyn, FNP    Brief Narrative:  Patient is a 66 years old female with past medical history of bipolar disorder, chronic back pain, COPD, depression, diabetes mellitus, GERD, hypertension, peripheral neuropathy presented to hospital with complaints of generalized weakness and multiple falls, constipation. Her ex-husband had been helping her some due to her weakness and falls.  Patient does have history of chronic back pain and is on chronic pain medications. In the ED, patient had stable vitals,  Urinalysis was negative for infection.  TSH of 0.4.  Potassium of 4.1.  Creatinine at 0.7.  LFTs within normal limits.  CBC was unremarkable.  Due to frequent falls and generalized weakness and concerns for safety at home with constipation, patient was considered for  admission to the hospital.  Assessment and plan.  Principal Problem:   Falls frequently Active Problems:   Current use of estrogen therapy   Degeneration of lumbar intervertebral disc   Essential hypertension   COPD (chronic obstructive pulmonary disease) (HCC)   GERD (gastroesophageal reflux disease)   Polyneuropathy due to type 2 diabetes mellitus (HCC)   Gait abnormality   Bipolar 1 disorder (HCC)   Constipation   Acute urinary retention   Generalized weakness   Ambulatory dysfunction   Generalized weakness, multiple falls with ambulatory dysfunction.   Laboratory data unremarkable.  Patient does have history of neuropathy.  Check vitamin B12 at 320, vitamin D at 32.  CK levels within normal limits.  Continue vitamin B12 and vitamin D.  TSH within normal limits.  On oxycodone and diazepam as outpatient.  We will continue oxycodone.  Hold diazepam for now.   Patient has multiple medications and there is concern for polypharmacy .  Continue fall precautions.  Lumbar spine CT scan showed osteopenia with degeneration largely stable from  2020 MRI.  Chronic disc degeneration at the L5-S1 with retrolisthesis with moderate to severe bilateral L5 foraminal stenosis.   Patient was seen by physical therapy recommended skilled nursing facility placement at this time. Would benefit from aggressive rehabilitation.  Abdominal discomfort, constipation.  CT scan of the abdomen and pelvis showed some evidence of constipation.  Has not had a bowel movement in the last 6 days and complains of distended abdomen.  Spoke with the nursing staff regarding the need for enema/suppository to address her constipation.  Continue MiraLAX stool softeners as well.  Acute urinary retention. On foley catheter, continue bowel regimen.  Patient has not still moved her bowels very unlikely that she will be able to succeed with spontaneous urination.  We will ensure adequate bowel movements today.  Do voiding trial subsequently.    Chronic back pain, degenerative joint disease. Chronically on narcotics as outpatient with oxycodone 15 mg every 6 hours. Patient follows up with pain management clinic as outpatient.  Discouraged use of narcotics if not necessary.  COPD Continue albuterol inhaler.  Currently compensated.  Diabetes mellitus with peripheral neuropathy. On Trulicity, Januvia and glipizide as outpatient.  We will hold.  Continue sliding scale insulin, Lyrica, diabetic diet.  Closely monitor blood glucose levels  Bipolar disorder depression.  Continue duloxetine, lamotrigine, Lyrica.    GERD/history of peptic ulcer disease. Continue Protonix.  Essential hypertension On lisinopril as outpatient.  Hold for today.  Patient is still on the lower side.     DVT prophylaxis: enoxaparin (LOVENOX) injection 40 mg Start: 10/26/21 1415   Code Status:  Code Status: Full Code  Disposition: Skilled nursing facility as per PT evaluation.  Status is: Observation  The patient will require care spanning > 2 midnights and should be moved to inpatient  because: Ambulatory dysfunction, falls, need for rehabitation, gross constipation.   Family Communication: None today  Consultants:  Physical therapy  Procedures:  None  Antimicrobials:  None  Anti-infectives (From admission, onward)    None      Subjective:  Today, patient was seen and examined at bedside.  Complains of abdominal distention but no pain nausea vomiting.  Has been constipated for at least the last 6 days.  Complains of mild right hip pain.   Objective: Vitals:   10/27/21 2016 10/27/21 2309 10/28/21 0258 10/28/21 0300  BP: (!) 101/55  118/69   Pulse: 76 78 82   Resp: '18 16 18   '$ Temp: 98 F (36.7 C)  98 F (36.7 C)   TempSrc: Oral  Oral   SpO2: 93% 90% 91%   Weight:    79.5 kg  Height:        Intake/Output Summary (Last 24 hours) at 10/28/2021 1035 Last data filed at 10/28/2021 0950 Gross per 24 hour  Intake 840 ml  Output 4450 ml  Net -3610 ml   Filed Weights   10/26/21 2231 10/27/21 0500 10/28/21 0300  Weight: 79.8 kg 79.5 kg 79.5 kg   Body mass index is 25.88 kg/m.   Physical Examination:  General:  Average built, not in obvious distress HENT:   No scleral pallor or icterus noted. Oral mucosa is moist.  Chest:    Diminished breath sounds bilaterally. No crackles or wheezes.  CVS: S1 &S2 heard. No murmur.  Regular rate and rhythm. Abdomen: Soft, nontender, distended abdomen, bowel sounds are heard.  Foley catheter in place. Extremities: No cyanosis, clubbing or edema.  Peripheral pulses are palpable. Psych: Alert, awake and oriented, normal mood CNS:  No cranial nerve deficits.  Moves all extremities.   Skin: Warm and dry.  No rashes noted.  Data Reviewed:   CBC: Recent Labs  Lab 10/26/21 0900 10/27/21 0522  WBC 8.1 7.4  NEUTROABS 5.4  --   HGB 14.9 14.5  HCT 45.6 43.2  MCV 96.0 96.6  PLT 216 299    Basic Metabolic Panel: Recent Labs  Lab 10/26/21 0900 10/27/21 0522  NA 141 138  K 4.1 4.0  CL 107 101  CO2 28 31   GLUCOSE 133* 156*  BUN 8 10  CREATININE 0.70 0.75  CALCIUM 9.2 9.5  MG  --  2.1    Liver Function Tests: Recent Labs  Lab 10/26/21 0900  AST 19  ALT 14  ALKPHOS 63  BILITOT 0.5  PROT 6.5  ALBUMIN 4.1     Radiology Studies: CT HEAD WO CONTRAST (5MM)  Result Date: 10/27/2021 CLINICAL DATA:  66 year old female with frequent falls. Weakness. EXAM: CT HEAD WITHOUT CONTRAST TECHNIQUE: Contiguous axial images were obtained from the base of the skull through the vertex without intravenous contrast. RADIATION DOSE REDUCTION: This exam was performed according to the departmental dose-optimization program which includes automated exposure control, adjustment of the mA and/or kV according to patient size and/or use of iterative reconstruction technique. COMPARISON:  Brain MRI 03/28/2020. Head CT 05/29/2017. FINDINGS: Brain: Cerebral volume remains normal for age. No midline shift, ventriculomegaly, mass effect, evidence of mass lesion, intracranial hemorrhage or evidence of cortically based acute infarction. Gray-white matter differentiation is stable and normal for age. No encephalomalacia identified. Vascular:  Calcified atherosclerosis at the skull base. Skull: Chronic osteopenia.  No acute osseous abnormality identified. Sinuses/Orbits: Tympanic cavity and Visualized paranasal sinuses and mastoids are stable and well aerated. Other: No acute orbit or scalp soft tissue finding. IMPRESSION: Stable and normal for age non contrast CT appearance of the brain. No recent traumatic injury identified. Electronically Signed   By: Genevie Ann M.D.   On: 10/27/2021 05:05   CT LUMBAR SPINE WO CONTRAST  Result Date: 10/27/2021 CLINICAL DATA:  66 year old female with low back pain. Extremity weakness. Multiple recent falls. EXAM: CT LUMBAR SPINE WITHOUT CONTRAST TECHNIQUE: Multidetector CT imaging of the lumbar spine was performed without intravenous contrast administration. Multiplanar CT image reconstructions  were also generated. RADIATION DOSE REDUCTION: This exam was performed according to the departmental dose-optimization program which includes automated exposure control, adjustment of the mA and/or kV according to patient size and/or use of iterative reconstruction technique. COMPARISON:  Lumbar MRI 01/01/2019. CT Chest, Abdomen, and Pelvis 10/26/2021. FINDINGS: Segmentation: Normal, the same numbering system used on the 2020 MRI. Alignment: Mildly improved lumbar lordosis from the previous MRI, less straightening. Mild chronic degenerative appearing retrolisthesis of L5 on S1 is stable. Vertebrae: Diffuse osteopenia. Visible lower thoracic levels appear intact. 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 Lumbar levels appear intact. Visible sacrum and SI joints appear intact. No acute osseous abnormality identified. Paraspinal and other soft tissues: Respiratory motion at the lung bases. Aortoiliac calcified atherosclerosis. Normal caliber abdominal aorta. Vicarious contrast excretion into the gallbladder. Negative visible other noncontrast abdominal and pelvic viscera. Negative lumbar paraspinal soft tissues. Disc levels: Capacious spinal canal. Lumbar spine degeneration largely stable from the 2020 MRI, notable for vacuum disc, foraminal disc osteophyte complex and mild facet hypertrophy in association with chronic L5-S1 retrolisthesis. However, there is a new left paracentral focus of gas in the ventral epidural space series 4, image 123 near the descending left S1 nerve root in the lateral recess. But no spinal stenosis. Mild to moderate left and moderate to severe right L5 neural foraminal stenosis appears chronic and stable. IMPRESSION: 1. Osteopenia. No acute osseous abnormality in the lumbar spine. 2. Lumbar spine degeneration is generally mild for age and largely stable from a 2020 MRI, with capacious spinal canal and no evidence of spinal stenosis. However, chronic disc  degeneration at L5-S1 in conjunction with retrolisthesis does appear progressed with evidence of a small disc herniation near the descending left S1 nerve roots in the lateral recess. Query left S1 radiculitis. 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 Electronically Signed   By: Genevie Ann M.D.   On: 10/27/2021 09:41   CT CHEST ABDOMEN PELVIS W CONTRAST  Result Date: 10/26/2021 CLINICAL DATA:  Lower abdominal pain with weakness x2 days. Fall 5 times in the past 2 days. EXAM: CT CHEST, ABDOMEN, AND PELVIS WITH CONTRAST TECHNIQUE: Multidetector CT imaging of the chest, abdomen and pelvis was performed following the standard protocol during bolus administration of intravenous contrast. RADIATION DOSE REDUCTION: This exam was performed according to the departmental dose-optimization program which includes automated exposure control, adjustment of the mA and/or kV according to patient size and/or use of iterative reconstruction technique. CONTRAST:  171m OMNIPAQUE IOHEXOL 300 MG/ML  SOLN COMPARISON:  CT April 30, 2019 FINDINGS: CT CHEST FINDINGS Cardiovascular: Aortic atherosclerosis without aneurysmal dilation of the thoracic aorta. No central pulmonary embolus on this nondedicated study. Normal size heart. No significant pericardial effusion/thickening. Coronary artery calcifications. Mediastinum/Nodes: No suspicious thyroid nodule. No supraclavicular  adenopathy. No mediastinal fluid or hematoma. No pathologically enlarged mediastinal, hilar or axillary lymph nodes. Lungs/Pleura: Mild biapical pleuroparenchymal scarring. Hypoventilatory change in the lung bases. Atelectasis in the right middle lobe and lingula. No evidence of parenchymal contusion or laceration. No pleural effusion. No pneumothorax. Musculoskeletal: Multilevel degenerative changes spine. No acute osseous abnormality. CT ABDOMEN PELVIS FINDINGS Hepatobiliary: Mild hepatic steatosis with focal fatty sparing along the  gallbladder fossa. No suspicious hepatic lesion. No hepatic injury or peripancreatic hematoma. Gallbladder is unremarkable. No biliary ductal dilation. Pancreas: No pancreatic ductal dilation, acute inflammation or evidence of laceration. Spleen: No splenic injury or perisplenic hematoma. Adrenals/Urinary Tract: No adrenal hemorrhage or renal injury identified. No hydronephrosis. Kidneys demonstrate symmetric enhancement and excretion of contrast material. Bladder is significantly distended extending above the sacral promontory. Stomach/Bowel: No radiopaque enteric contrast material was administered. Stomach is minimally distended limiting evaluation. There is no pathologic dilation of small or large bowel. Terminal ileum appears normal. Normal appendix. Large volume of formed stool throughout the colon suggestive of constipation. No evidence of acute bowel inflammation. Vascular/Lymphatic: Aortic atherosclerosis without aneurysmal dilation. No pathologically enlarged abdominal or pelvic lymph nodes. Reproductive: Status post hysterectomy. No adnexal masses. Other: No significant abdominopelvic free fluid. No abdominal wall herniation. Musculoskeletal: Multilevel degenerative changes spine. No acute osseous abnormality. IMPRESSION: 1. No evidence of acute traumatic injury in the chest, abdomen, or pelvis. 2. Large volume of formed stool throughout the colon suggestive of constipation. 3. Significantly distended urinary bladder extending above the sacral promontory. Correlate for urinary retention. 4. Hepatic steatosis. 5.  Aortic Atherosclerosis (ICD10-I70.0). Electronically Signed   By: Dahlia Bailiff M.D.   On: 10/26/2021 11:51      LOS: 0 days    Flora Lipps, MD [facility] Available via Epic secure chat 7am-7pm After these hours, please refer to coverage provider listed on [email or web address] 10/28/2021, 10:35 AM

## 2021-10-28 NOTE — Progress Notes (Signed)
CSW completed FL2 and faxed patient's clinical information out to local facilities for review.  Madilyn Fireman, MSW, LCSW Transitions of Care  Clinical Social Worker II [phone]

## 2021-10-28 NOTE — NC FL2 (Signed)
MEDICAID FL2 LEVEL OF CARE SCREENING TOOL     IDENTIFICATION  Patient Name: Cynthia Deleon Birthdate: [birth year] Sex: female Admission Date (Current Location): 10/26/2021  [facility] and [place] Number:  [employer] and Address:  [place] [address], [place]      Provider Number: [id number]  Attending Physician Name and Address:  Flora Lipps, MD  Relative Name and Phone Number:       Current Level of Care: Hospital Recommended Level of Care: [place] Prior Approval Number:    Date Approved/Denied: 10/28/21 PASRR Number: [id number]  Discharge Plan: SNF    Current Diagnoses: Patient Active Problem List   Diagnosis Date Noted   Generalized weakness 10/28/2021   Ambulatory dysfunction 10/28/2021   Falls frequently 10/26/2021   Constipation 10/26/2021   Acute urinary retention 10/26/2021   Bipolar 1 disorder ([place])    Neck pain 05/21/2021   Gait abnormality 05/21/2021   Right leg swelling 05/21/2021   Polyneuropathy due to type 2 diabetes mellitus ([place]) 10/10/2020   Seasonal allergies 10/10/2020   Controlled substance agreement signed 10/10/2020   Stress incontinence of urine 06/20/2020   Diplopia 03/20/2020   GERD (gastroesophageal reflux disease)    Essential hypertension    Incontinence    COPD (chronic obstructive pulmonary disease) ([place])    Osteoarthritis of left knee 04/01/2019   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   Type 2 diabetes mellitus with hypoglycemia without coma, without long-term current use of insulin ([place]) 07/06/2018   Bilateral temporomandibular joint pain 01/21/2018   OSA (obstructive sleep apnea) 01/21/2018   Vertigo 01/21/2018   DDD (degenerative disc disease), cervical 07/17/2017   Degeneration of lumbar intervertebral disc 07/17/2017   OCD (obsessive compulsive disorder) 11/15/2015   Tobacco use disorder 11/15/2015   Current use of estrogen therapy 01/12/2014   PTSD  (post-traumatic stress disorder) 08/03/2011   Bipolar I disorder, most recent episode mixed, severe with psychotic features ([place])     Orientation RESPIRATION BLADDER Height & Weight     Self, Time, Situation, Place  Normal Continent, Indwelling catheter Weight: 175 lb 4.3 oz (79.5 kg) Height:  '5\' 9"'$  (175.3 cm)  BEHAVIORAL SYMPTOMS/MOOD NEUROLOGICAL BOWEL NUTRITION STATUS      Continent Diet (See discharge summary)  AMBULATORY STATUS COMMUNICATION OF NEEDS Skin   Limited Assist Verbally Normal                       Personal Care Assistance Level of Assistance  Bathing, Dressing, Feeding Bathing Assistance: Independent Feeding assistance: Independent Dressing Assistance: Limited assistance     Functional Limitations Info  Speech, Sight, Hearing Sight Info: Adequate Hearing Info: Adequate Speech Info: Adequate    SPECIAL CARE FACTORS FREQUENCY  PT (By licensed PT), OT (By licensed OT)     PT Frequency: 5x weekly OT Frequency: 5x weekly            Contractures Contractures Info: Not present    Additional Factors Info  Allergies, Code Status Code Status Info: Full Code Allergies Info: Chantix (Varenicline)   Divalproex Sodium   Other   Pseudoeph-hydrocodone-gg   Sulfa Antibiotics   Varenicline Tartrate   Ambien (Zolpidem)   Clavulanic Acid   Codeine   Elemental Sulfur   Hydrocod Poli-chlorphe Poli Er   Hydrocodone   Macrobid (Nitrofurantoin)   Paxil (Paroxetine Hcl)   Prednisone   Amoxicillin   Farxiga (Dapagliflozin)   Metformin And Related   Tape  Current Medications (10/28/2021):  This is the current hospital active medication list Current Facility-Administered Medications  Medication Dose Route Frequency Provider Last Rate Last Admin   0.9 %  sodium chloride infusion  250 mL Intravenous PRN Pokhrel, Laxman, MD       albuterol (PROVENTIL) (2.5 MG/3ML) 0.083% nebulizer solution 3 mL  3 mL Inhalation Q6H PRN Pokhrel, Laxman, MD       atorvastatin  (LIPITOR) tablet 40 mg  40 mg Oral Daily Pokhrel, Laxman, MD   40 mg at 10/28/21 0947   azelastine (ASTELIN) 0.1 % nasal spray 1 spray  1 spray Each Nare BID Pokhrel, Laxman, MD   1 spray at 10/28/21 1125   bisacodyl (DULCOLAX) EC tablet 5 mg  5 mg Oral Daily PRN Pokhrel, Laxman, MD       Chlorhexidine Gluconate Cloth 2 % PADS 6 each  6 each Topical Daily Pokhrel, Laxman, MD   6 each at 10/28/21 0950   cholecalciferol (VITAMIN D3) tablet 1,000 Units  1,000 Units Oral Daily Pokhrel, Laxman, MD   1,000 Units at 10/28/21 0948   docusate sodium (COLACE) capsule 100 mg  100 mg Oral BID Pokhrel, Laxman, MD   100 mg at 10/28/21 0947   DULoxetine (CYMBALTA) DR capsule 60 mg  60 mg Oral Daily Pokhrel, Laxman, MD   60 mg at 10/28/21 0948   enoxaparin (LOVENOX) injection 40 mg  40 mg Subcutaneous Daily Pokhrel, Laxman, MD   40 mg at 10/28/21 6295   estrogens (conjugated) (PREMARIN) tablet 0.3 mg  0.3 mg Oral Daily Pokhrel, Laxman, MD       fluticasone (FLONASE) 50 MCG/ACT nasal spray 2 spray  2 spray Each Nare Daily Pokhrel, Laxman, MD   2 spray at 10/28/21 0948   hydrALAZINE (APRESOLINE) injection 10 mg  10 mg Intravenous Q6H PRN Pokhrel, Laxman, MD       insulin aspart (novoLOG) injection 0-5 Units  0-5 Units Subcutaneous QHS Pokhrel, Laxman, MD       insulin aspart (novoLOG) injection 0-9 Units  0-9 Units Subcutaneous TID WC Pokhrel, Laxman, MD   2 Units at 10/28/21 1226   lamoTRIgine (LAMICTAL) tablet 200 mg  200 mg Oral QHS Pokhrel, Laxman, MD   200 mg at 10/27/21 2137   meclizine (ANTIVERT) tablet 50 mg  50 mg Oral Daily PRN Pokhrel, Laxman, MD       montelukast (SINGULAIR) tablet 10 mg  10 mg Oral QHS Pokhrel, Laxman, MD   10 mg at 10/27/21 2137   ondansetron (ZOFRAN) tablet 4 mg  4 mg Oral Q6H PRN Pokhrel, Laxman, MD       Or   ondansetron (ZOFRAN) injection 4 mg  4 mg Intravenous Q6H PRN Pokhrel, Laxman, MD       oxyCODONE (Oxy IR/ROXICODONE) immediate release tablet 5-15 mg  5-15 mg Oral Q6H PRN  Pokhrel, Laxman, MD   5 mg at 10/28/21 0948   pantoprazole (PROTONIX) EC tablet 20 mg  20 mg Oral Daily Pokhrel, Laxman, MD   20 mg at 10/28/21 1126   polyethylene glycol (MIRALAX / GLYCOLAX) packet 17 g  17 g Oral BID Pokhrel, Laxman, MD   17 g at 10/28/21 0948   pregabalin (LYRICA) capsule 150 mg  150 mg Oral TID Pokhrel, Laxman, MD   150 mg at 10/28/21 0948   simethicone (MYLICON) chewable tablet 80 mg  80 mg Oral QID PRN Pokhrel, Laxman, MD       sodium chloride flush (NS) 0.9 % injection 3  mL  3 mL Intravenous Q12H Pokhrel, Laxman, MD   3 mL at 10/28/21 0950   sodium chloride flush (NS) 0.9 % injection 3 mL  3 mL Intravenous PRN Pokhrel, Laxman, MD       vitamin B-12 (CYANOCOBALAMIN) tablet 1,000 mcg  1,000 mcg Oral Daily Pokhrel, Laxman, MD   1,000 mcg at 10/28/21 5051     Discharge Medications: Please see discharge summary for a list of discharge medications.  Relevant Imaging Results:  Relevant Lab Results:   Additional Information SSN: [id number]  Archie Endo, LCSW

## 2021-10-28 NOTE — Plan of Care (Signed)

## 2021-10-29 DIAGNOSIS — R269 Unspecified abnormalities of gait and mobility: Secondary | ICD-10-CM | POA: Diagnosis not present

## 2021-10-29 DIAGNOSIS — M5136 Other intervertebral disc degeneration, lumbar region: Secondary | ICD-10-CM | POA: Diagnosis not present

## 2021-10-29 DIAGNOSIS — Z79899 Other long term (current) drug therapy: Secondary | ICD-10-CM | POA: Diagnosis not present

## 2021-10-29 DIAGNOSIS — K5900 Constipation, unspecified: Secondary | ICD-10-CM | POA: Diagnosis not present

## 2021-10-29 DIAGNOSIS — F319 Bipolar disorder, unspecified: Secondary | ICD-10-CM | POA: Diagnosis not present

## 2021-10-29 DIAGNOSIS — Z7984 Long term (current) use of oral hypoglycemic drugs: Secondary | ICD-10-CM | POA: Diagnosis not present

## 2021-10-29 DIAGNOSIS — G4733 Obstructive sleep apnea (adult) (pediatric): Secondary | ICD-10-CM | POA: Diagnosis not present

## 2021-10-29 DIAGNOSIS — M8588 Other specified disorders of bone density and structure, other site: Secondary | ICD-10-CM | POA: Diagnosis not present

## 2021-10-29 DIAGNOSIS — R10819 Abdominal tenderness, unspecified site: Secondary | ICD-10-CM | POA: Diagnosis not present

## 2021-10-29 DIAGNOSIS — Z87891 Personal history of nicotine dependence: Secondary | ICD-10-CM | POA: Diagnosis not present

## 2021-10-29 DIAGNOSIS — K760 Fatty (change of) liver, not elsewhere classified: Secondary | ICD-10-CM | POA: Diagnosis not present

## 2021-10-29 DIAGNOSIS — R2689 Other abnormalities of gait and mobility: Secondary | ICD-10-CM | POA: Diagnosis not present

## 2021-10-29 DIAGNOSIS — N3289 Other specified disorders of bladder: Secondary | ICD-10-CM | POA: Diagnosis not present

## 2021-10-29 DIAGNOSIS — R296 Repeated falls: Secondary | ICD-10-CM | POA: Diagnosis not present

## 2021-10-29 DIAGNOSIS — J449 Chronic obstructive pulmonary disease, unspecified: Secondary | ICD-10-CM | POA: Diagnosis not present

## 2021-10-29 DIAGNOSIS — I700 Atherosclerosis of aorta: Secondary | ICD-10-CM | POA: Diagnosis not present

## 2021-10-29 DIAGNOSIS — R339 Retention of urine, unspecified: Secondary | ICD-10-CM | POA: Diagnosis not present

## 2021-10-29 DIAGNOSIS — R1030 Lower abdominal pain, unspecified: Secondary | ICD-10-CM | POA: Diagnosis not present

## 2021-10-29 DIAGNOSIS — M25511 Pain in right shoulder: Secondary | ICD-10-CM | POA: Diagnosis not present

## 2021-10-29 DIAGNOSIS — G8929 Other chronic pain: Secondary | ICD-10-CM | POA: Diagnosis not present

## 2021-10-29 DIAGNOSIS — E1140 Type 2 diabetes mellitus with diabetic neuropathy, unspecified: Secondary | ICD-10-CM | POA: Diagnosis not present

## 2021-10-29 DIAGNOSIS — E1142 Type 2 diabetes mellitus with diabetic polyneuropathy: Secondary | ICD-10-CM | POA: Diagnosis not present

## 2021-10-29 DIAGNOSIS — Z79891 Long term (current) use of opiate analgesic: Secondary | ICD-10-CM | POA: Diagnosis not present

## 2021-10-29 DIAGNOSIS — I10 Essential (primary) hypertension: Secondary | ICD-10-CM | POA: Diagnosis not present

## 2021-10-29 DIAGNOSIS — M47814 Spondylosis without myelopathy or radiculopathy, thoracic region: Secondary | ICD-10-CM | POA: Diagnosis not present

## 2021-10-29 DIAGNOSIS — R338 Other retention of urine: Secondary | ICD-10-CM | POA: Diagnosis not present

## 2021-10-29 DIAGNOSIS — K219 Gastro-esophageal reflux disease without esophagitis: Secondary | ICD-10-CM | POA: Diagnosis not present

## 2021-10-29 LAB — GLUCOSE, CAPILLARY
Glucose-Capillary: 176 mg/dL — ABNORMAL HIGH (ref 70–99)
Glucose-Capillary: 251 mg/dL — ABNORMAL HIGH (ref 70–99)
Glucose-Capillary: 157 mg/dL — ABNORMAL HIGH (ref 70–99)
Glucose-Capillary: 210 mg/dL — ABNORMAL HIGH (ref 70–99)

## 2021-10-29 MED ORDER — LISINOPRIL 5 MG PO TABS
5.0000 mg | ORAL_TABLET | Freq: Every day | ORAL | Status: DC
  Administered 2021-10-29 – 2021-10-31 (×3): 5 mg via ORAL
  Filled 2021-10-29 (×3): qty 1

## 2021-10-29 NOTE — Progress Notes (Signed)
Inpatient Diabetes Program Recommendations  AACE/ADA: New Consensus Statement on Inpatient Glycemic Control   Target Ranges:  Prepandial:   less than 140 mg/dL      Peak postprandial:   less than 180 mg/dL (1-2 hours)      Critically ill patients:  140 - 180 mg/dL    Latest Reference Range & Units 10/28/21 07:17 10/28/21 11:44 10/28/21 15:58 10/28/21 20:45 10/29/21 07:47  Glucose-Capillary 70 - 99 mg/dL 142 (H) 197 (H) 203 (H) 268 (H) 176 (H)   Review of Glycemic Control  Diabetes history: DM2 Outpatient Diabetes medications: Glipizide XL 5 mg BID, Trulicity 3 mg Qweek, Januvia 100 mg daily Current orders for Inpatient glycemic control: Novolog 0-9 units TID with meals, Novolog 0-5 units QHS  Inpatient Diabetes Program Recommendations:    Insulin: Please consider ordering Novolog 3 units TID with meals for meal coverage if patient eats at least 50% of meals.  Thanks, Barnie Alderman, RN, MSN, CDE Diabetes Coordinator Inpatient Diabetes Program [phone] (Team Pager from 8am to 5pm)

## 2021-10-29 NOTE — TOC Progression Note (Signed)
Transition of Care [facility]) - Progression Note    Patient Details  Name: Cynthia Deleon MRN: [id number] Date of Birth: [birth year]  Transition of Care [facility]) CM/SW Contact  Salome Arnt, [place] Phone Number: 10/29/2021, 1:06 PM  Clinical Narrative: Pt accepts bed at [facility]. Facility notified. CMA to start SNF authorization. TOC will continue to follow.            Expected Discharge Plan and Services                                                 Social Determinants of Health (SDOH) Interventions    Readmission Risk Interventions     View : No data to display.

## 2021-10-29 NOTE — Progress Notes (Addendum)
PROGRESS NOTE    Cynthia Deleon  XIP:[id number] DOB: 08-04-1955 DOA: 10/26/2021 PCP: Loman Brooklyn, FNP    Brief Narrative:  Patient is a 66 years old female with past medical history of bipolar disorder, chronic back pain, COPD, depression, diabetes mellitus, GERD, hypertension, peripheral neuropathy presented to hospital with complaints of generalized weakness and multiple falls, constipation. Her ex-husband had been helping her some due to her weakness and falls.  Patient does have history of chronic back pain and is on chronic pain medications. In the ED, patient had stable vitals,  Urinalysis was negative for infection.  TSH of 0.4.  Potassium of 4.1.  Creatinine at 0.7.  LFTs within normal limits.  CBC was unremarkable.  Due to frequent falls and generalized weakness and concerns for safety at home with constipation, patient was considered for  admission to the hospital.  Assessment and plan.  Principal Problem:   Falls frequently Active Problems:   Current use of estrogen therapy   Degeneration of lumbar intervertebral disc   Essential hypertension   COPD (chronic obstructive pulmonary disease) (HCC)   GERD (gastroesophageal reflux disease)   Polyneuropathy due to type 2 diabetes mellitus (HCC)   Gait abnormality   Bipolar 1 disorder (HCC)   Constipation   Acute urinary retention   Generalized weakness   Ambulatory dysfunction   Generalized weakness, multiple falls with ambulatory dysfunction.    vitamin B12 at 320, vitamin D at 32.  CK levels within normal limits.  Continue oral vitamin B12 and vitamin D.  TSH within normal limits.  On oxycodone and diazepam as outpatient.  We will continue oxycodone.  Hold diazepam for now.   Patient has multiple medications and there is concern for polypharmacy .  Continue fall precautions.  Lumbar spine CT scan showed osteopenia with degeneration largely stable from 2020 MRI.  Chronic disc degeneration at the L5-S1 with retrolisthesis  with moderate to severe bilateral L5 foraminal stenosis.   Patient was seen by physical therapy recommended skilled nursing facility placement at this time. Would benefit from aggressive rehabilitation.  Abdominal discomfort, constipation.  CT scan of the abdomen and pelvis showed some evidence of constipation.  On aggressive bowel regimen and had a bowel movement today and yesterday.  Acute urinary retention. On foley catheter, continue bowel regimen.  We will discontinue Foley catheter today and do voiding trial.  Has had bowel movement.  Chronic back pain, degenerative joint disease. Chronically on narcotics as outpatient with oxycodone 15 mg every 6 hours.  Continue oxycodone while in the hospital.  Patient follows up with pain management clinic as outpatient.    COPD Continue albuterol inhaler.  Currently compensated.  Diabetes mellitus with peripheral neuropathy. On Trulicity, Januvia and glipizide as outpatient.  We will hold.  Continue sliding scale insulin, Lyrica, diabetic diet.  Closely monitor blood glucose levels.  Latest POC glucose of 176  Bipolar disorder depression.  Continue duloxetine, lamotrigine, Lyrica.    GERD/history of peptic ulcer disease. Continue Protonix.  Essential hypertension On lisinopril as outpatient.  Will resume at this time.    DVT prophylaxis: enoxaparin (LOVENOX) injection 40 mg Start: 10/26/21 1415   Code Status:     Code Status: Full Code  Disposition:  Skilled nursing facility as per PT evaluation.  Medically stable for disposition.  Status is: Observation  The patient will require care spanning > 2 midnights and should be moved to inpatient because: Ambulatory dysfunction, falls, need for rehabilitation   Family Communication:  Spoke  with the patient at bedside. I spoke with the patient's husband on the phone and updated him about the clinical condition of the patient.   Consultants:  Physical therapy  Procedures:   None  Antimicrobials:  None  Anti-infectives (From admission, onward)    None      Subjective:  Today, patient was seen and examined at bedside.  Family had bowel movement yesterday and today.  Denies any obvious nausea or vomiting abdominal pain fever chills  Objective: Vitals:   10/28/21 1323 10/28/21 2048 10/28/21 2323 10/29/21 0639  BP: (!) 128/54 (!) 132/57  138/83  Pulse: 74 90 89 87  Resp: '16 17 16 17  '$ Temp: 97.6 F (36.4 C) 98 F (36.7 C)  98.3 F (36.8 C)  TempSrc:    Oral  SpO2: 97% 92% 92% 94%  Weight:      Height:        Intake/Output Summary (Last 24 hours) at 10/29/2021 1100 Last data filed at 10/29/2021 0900 Gross per 24 hour  Intake 720 ml  Output 4300 ml  Net -3580 ml   Filed Weights   10/26/21 2231 10/27/21 0500 10/28/21 0300  Weight: 79.8 kg 79.5 kg 79.5 kg   Body mass index is 25.88 kg/m.   Physical Examination:  General:  Average built, not in obvious distress HENT:   No scleral pallor or icterus noted. Oral mucosa is moist.  Chest:    Diminished breath sounds bilaterally. No crackles or wheezes.  CVS: S1 &S2 heard. No murmur.  Regular rate and rhythm. Abdomen: Soft, nontender, nondistended.  Bowel sounds are heard.  Foley catheter in place. Extremities: No cyanosis, clubbing or edema.  Peripheral pulses are palpable. Psych: Alert, awake and oriented, normal mood CNS:  No cranial nerve deficits.  Power equal in all extremities.  Moves all extremities.  Gait could not be tested. Skin: Warm and dry.  No rashes noted.   Data Reviewed:   CBC: Recent Labs  Lab 10/26/21 0900 10/27/21 0522  WBC 8.1 7.4  NEUTROABS 5.4  --   HGB 14.9 14.5  HCT 45.6 43.2  MCV 96.0 96.6  PLT 216 732    Basic Metabolic Panel: Recent Labs  Lab 10/26/21 0900 10/27/21 0522  NA 141 138  K 4.1 4.0  CL 107 101  CO2 28 31  GLUCOSE 133* 156*  BUN 8 10  CREATININE 0.70 0.75  CALCIUM 9.2 9.5  MG  --  2.1    Liver Function Tests: Recent Labs   Lab 10/26/21 0900  AST 19  ALT 14  ALKPHOS 63  BILITOT 0.5  PROT 6.5  ALBUMIN 4.1     Radiology Studies: No results found.    LOS: 0 days    Flora Lipps, MD [facility] Available via Epic secure chat 7am-7pm After these hours, please refer to coverage provider listed on [email or web address] 10/29/2021, 11:00 AM

## 2021-10-30 ENCOUNTER — Observation Stay (HOSPITAL_COMMUNITY): Payer: Medicare Other

## 2021-10-30 ENCOUNTER — Other Ambulatory Visit: Payer: Self-pay | Admitting: Family Medicine

## 2021-10-30 ENCOUNTER — Encounter (HOSPITAL_COMMUNITY): Payer: Self-pay | Admitting: Gastroenterology

## 2021-10-30 DIAGNOSIS — N3289 Other specified disorders of bladder: Secondary | ICD-10-CM | POA: Diagnosis not present

## 2021-10-30 DIAGNOSIS — K760 Fatty (change of) liver, not elsewhere classified: Secondary | ICD-10-CM | POA: Diagnosis not present

## 2021-10-30 DIAGNOSIS — E11649 Type 2 diabetes mellitus with hypoglycemia without coma: Secondary | ICD-10-CM

## 2021-10-30 DIAGNOSIS — R269 Unspecified abnormalities of gait and mobility: Secondary | ICD-10-CM | POA: Diagnosis not present

## 2021-10-30 DIAGNOSIS — R531 Weakness: Secondary | ICD-10-CM | POA: Diagnosis not present

## 2021-10-30 DIAGNOSIS — M5136 Other intervertebral disc degeneration, lumbar region: Secondary | ICD-10-CM | POA: Diagnosis not present

## 2021-10-30 DIAGNOSIS — G4733 Obstructive sleep apnea (adult) (pediatric): Secondary | ICD-10-CM | POA: Diagnosis not present

## 2021-10-30 DIAGNOSIS — R338 Other retention of urine: Secondary | ICD-10-CM | POA: Diagnosis not present

## 2021-10-30 DIAGNOSIS — G8929 Other chronic pain: Secondary | ICD-10-CM | POA: Diagnosis not present

## 2021-10-30 DIAGNOSIS — M79604 Pain in right leg: Secondary | ICD-10-CM | POA: Diagnosis not present

## 2021-10-30 DIAGNOSIS — Z043 Encounter for examination and observation following other accident: Secondary | ICD-10-CM | POA: Diagnosis not present

## 2021-10-30 DIAGNOSIS — K5900 Constipation, unspecified: Secondary | ICD-10-CM | POA: Diagnosis not present

## 2021-10-30 DIAGNOSIS — M47814 Spondylosis without myelopathy or radiculopathy, thoracic region: Secondary | ICD-10-CM | POA: Diagnosis not present

## 2021-10-30 DIAGNOSIS — Z79899 Other long term (current) drug therapy: Secondary | ICD-10-CM | POA: Diagnosis not present

## 2021-10-30 DIAGNOSIS — I10 Essential (primary) hypertension: Secondary | ICD-10-CM | POA: Diagnosis not present

## 2021-10-30 DIAGNOSIS — R1030 Lower abdominal pain, unspecified: Secondary | ICD-10-CM | POA: Diagnosis not present

## 2021-10-30 DIAGNOSIS — N393 Stress incontinence (female) (male): Secondary | ICD-10-CM

## 2021-10-30 DIAGNOSIS — R339 Retention of urine, unspecified: Secondary | ICD-10-CM | POA: Diagnosis not present

## 2021-10-30 DIAGNOSIS — Z7984 Long term (current) use of oral hypoglycemic drugs: Secondary | ICD-10-CM | POA: Diagnosis not present

## 2021-10-30 DIAGNOSIS — K219 Gastro-esophageal reflux disease without esophagitis: Secondary | ICD-10-CM | POA: Diagnosis not present

## 2021-10-30 DIAGNOSIS — J449 Chronic obstructive pulmonary disease, unspecified: Secondary | ICD-10-CM | POA: Diagnosis not present

## 2021-10-30 DIAGNOSIS — E1140 Type 2 diabetes mellitus with diabetic neuropathy, unspecified: Secondary | ICD-10-CM | POA: Diagnosis not present

## 2021-10-30 DIAGNOSIS — R262 Difficulty in walking, not elsewhere classified: Secondary | ICD-10-CM

## 2021-10-30 DIAGNOSIS — I700 Atherosclerosis of aorta: Secondary | ICD-10-CM | POA: Diagnosis not present

## 2021-10-30 DIAGNOSIS — E1142 Type 2 diabetes mellitus with diabetic polyneuropathy: Secondary | ICD-10-CM | POA: Diagnosis not present

## 2021-10-30 DIAGNOSIS — M25511 Pain in right shoulder: Secondary | ICD-10-CM | POA: Diagnosis not present

## 2021-10-30 DIAGNOSIS — Z87891 Personal history of nicotine dependence: Secondary | ICD-10-CM | POA: Diagnosis not present

## 2021-10-30 DIAGNOSIS — Z79891 Long term (current) use of opiate analgesic: Secondary | ICD-10-CM | POA: Diagnosis not present

## 2021-10-30 DIAGNOSIS — R296 Repeated falls: Secondary | ICD-10-CM | POA: Diagnosis not present

## 2021-10-30 DIAGNOSIS — M8588 Other specified disorders of bone density and structure, other site: Secondary | ICD-10-CM | POA: Diagnosis not present

## 2021-10-30 DIAGNOSIS — R2689 Other abnormalities of gait and mobility: Secondary | ICD-10-CM | POA: Diagnosis not present

## 2021-10-30 DIAGNOSIS — R10819 Abdominal tenderness, unspecified site: Secondary | ICD-10-CM | POA: Diagnosis not present

## 2021-10-30 LAB — GLUCOSE, CAPILLARY
Glucose-Capillary: 257 mg/dL — ABNORMAL HIGH (ref 70–99)
Glucose-Capillary: 240 mg/dL — ABNORMAL HIGH (ref 70–99)

## 2021-10-30 NOTE — Progress Notes (Signed)
Inpatient Diabetes Program Recommendations  AACE/ADA: New Consensus Statement on Inpatient Glycemic Control   Target Ranges:  Prepandial:   less than 140 mg/dL      Peak postprandial:   less than 180 mg/dL (1-2 hours)      Critically ill patients:  140 - 180 mg/dL    Latest Reference Range & Units 10/29/21 07:47 10/29/21 11:33 10/29/21 16:50 10/29/21 20:47  Glucose-Capillary 70 - 99 mg/dL 176 (H) 251 (H) 157 (H) 210 (H)    Review of Glycemic Control  Diabetes history: DM2 Outpatient Diabetes medications: Glipizide XL 5 mg BID, Trulicity 3 mg Qweek, Januvia 100 mg daily Current orders for Inpatient glycemic control: Novolog 0-9 units TID with meals, Novolog 0-5 units QHS   Inpatient Diabetes Program Recommendations:     Insulin: Please consider ordering Novolog 3 units TID with meals for meal coverage if patient eats at least 50% of meals.   Thanks, Barnie Alderman, RN, MSN, CDE Diabetes Coordinator Inpatient Diabetes Program [phone] (Team Pager from 8am to 5pm)

## 2021-10-30 NOTE — Progress Notes (Signed)
Patient found ambulating in room and as this nurse entered room, patient was turning off bed alarm. This nurse educated patient on importance of bed alarm and that turning it off without assistance could result in a fall or injury. Patient states "But I'm fine walking around, look at me." This nurse again educated patient and patient requested bed alarm be turned off. Patient encouraged to call staff for assistance when ambulating. Patient also asked this nurse if she could go home with home health instead of SNF. This nurse educated her on the importance of therapy and the frequency of therapy at SNF versus home home. Patient requesting if PT could work with her again, and if social worker could contact her. Ambrose Pancoast, LCSW made aware.

## 2021-10-30 NOTE — Progress Notes (Addendum)
PROGRESS NOTE    Cynthia Deleon  XNT:[id number] DOB: Dec 05, 1955 DOA: 10/26/2021 PCP: Loman Brooklyn, FNP    Brief Narrative:  Patient is a 66 years old female with past medical history of bipolar disorder, chronic back pain, COPD, depression, diabetes mellitus, GERD, hypertension, peripheral neuropathy presented to hospital with complaints of generalized weakness and multiple falls, constipation. Her ex-husband had been helping her some due to her weakness and falls.  Patient does have history of chronic back pain and is on chronic pain medications. In the ED, patient had stable vitals,  Urinalysis was negative for infection.  TSH of 0.4.  Potassium of 4.1.  Creatinine at 0.7.  LFTs within normal limits.  CBC was unremarkable.  Due to frequent falls and generalized weakness and concerns for safety at home, patient was considered for  admission to the hospital.  Assessment and plan.  Principal Problem:   Falls frequently Active Problems:   Current use of estrogen therapy   Degeneration of lumbar intervertebral disc   Essential hypertension   COPD (chronic obstructive pulmonary disease) (HCC)   GERD (gastroesophageal reflux disease)   Polyneuropathy due to type 2 diabetes mellitus (HCC)   Gait abnormality   Bipolar 1 disorder (HCC)   Constipation   Acute urinary retention   Generalized weakness   Ambulatory dysfunction   Generalized weakness, multiple falls with ambulatory dysfunction.   Likely multifactorial secondary to polyneuropathy, degenerative joint disease of the back, polypharmacy.  vitamin B12 at 320, vitamin D at 32.  CK levels within normal limits.  Continue oral vitamin B12 and vitamin D.  TSH within normal limits.  On oxycodone and diazepam as outpatient.  We will continue oxycodone.  Plan is to discontinue benzodiazepines on discharge.  Currently not on it.  Patient has multiple medications and there is concern for polypharmacy .  Continue fall precautions.  Lumbar  spine CT scan showed osteopenia with degeneration largely stable from 2020 MRI.  Chronic disc degeneration at the L5-S1 with retrolisthesis with moderate to severe bilateral L5 foraminal stenosis.   Patient was seen by physical therapy recommended skilled nursing facility placement at this time. Would benefit from aggressive rehabilitation.  Right shoulder pain, right hip/pelvic pain.  Will get x-ray of the right shoulder and pelvis.  Complains of right lower extremity pain and will get the ultrasound of the right lower extremity to rule out DVT.  Abdominal discomfort, constipation.  CT scan of the abdomen and pelvis showed some evidence of constipation.  On aggressive bowel regimen and will continue with bowel regimen while in the hospital  Acute urinary retention.  Resolved at this time.  Temporarily required Foley catheter  Chronic back pain, degenerative joint disease. Chronically on narcotics as outpatient with oxycodone 15 mg every 6 hours.  Continue oxycodone while in the hospital.  Patient follows up with pain management clinic as outpatient.    COPD Continue albuterol inhaler.  Currently compensated.  Diabetes mellitus with peripheral neuropathy. On Trulicity, Januvia and glipizide as outpatient.  We will hold.  Continue sliding scale insulin, Lyrica, diabetic diet.  Closely monitor blood glucose levels.  Latest POC glucose of 257  Bipolar disorder depression.  Continue duloxetine, lamotrigine, Lyrica.    GERD/history of peptic ulcer disease. Continue Protonix.  Essential hypertension On lisinopril as outpatient.  Has been resumed at this time.  Latest blood pressure of 105/50    DVT prophylaxis: enoxaparin (LOVENOX) injection 40 mg Start: 10/26/21 1415   Code Status:  Code Status: Full Code  Disposition:  Pending skilled nursing facility placement. Medically stable for disposition.  Status is: Observation  The patient will require care spanning > 2 midnights and  should be moved to inpatient because: Ambulatory dysfunction, falls, need for rehabilitation   Family Communication:   I spoke with the patient's husband  on 10/29/2021.   Consultants:  Physical therapy  Procedures:  None  Antimicrobials:  None  Anti-infectives (From admission, onward)    None      Subjective:  Today, patient was seen and examined at bedside.  Complains of right shoulder and right hip pain.   Objective: Vitals:   10/29/21 1258 10/29/21 2044 10/30/21 0445 10/30/21 0500  BP: 117/71 124/63 (!) 105/50   Pulse: 88 77 73   Resp:  20 18   Temp: 98.4 F (36.9 C) 98.3 F (36.8 C) 98.1 F (36.7 C)   TempSrc: Oral Oral    SpO2: 95% 92% 95%   Weight:    80.1 kg  Height:        Intake/Output Summary (Last 24 hours) at 10/30/2021 1142 Last data filed at 10/30/2021 0500 Gross per 24 hour  Intake 960 ml  Output --  Net 960 ml   Filed Weights   10/27/21 0500 10/28/21 0300 10/30/21 0500  Weight: 79.5 kg 79.5 kg 80.1 kg   Body mass index is 26.08 kg/m.   Physical Examination:  General:  Average built, not in obvious distress HENT:   No scleral pallor or icterus noted. Oral mucosa is moist.  Chest:    Diminished breath sounds bilaterally. No crackles or wheezes.  CVS: S1 &S2 heard. No murmur.  Regular rate and rhythm. Abdomen: Soft, nontender, nondistended.  Bowel sounds are heard.   Extremities: No cyanosis, clubbing or edema.  Peripheral pulses are palpable.  Mild restricted movement of the right shoulder. Psych: Alert, awake and oriented, normal mood CNS:  No cranial nerve deficits.  Moves all extremities.  Gait could not be tested. Skin: Warm and dry.  No rashes noted.  Data Reviewed:   CBC: Recent Labs  Lab 10/26/21 0900 10/27/21 0522  WBC 8.1 7.4  NEUTROABS 5.4  --   HGB 14.9 14.5  HCT 45.6 43.2  MCV 96.0 96.6  PLT 216 301    Basic Metabolic Panel: Recent Labs  Lab 10/26/21 0900 10/27/21 0522  NA 141 138  K 4.1 4.0  CL 107 101   CO2 28 31  GLUCOSE 133* 156*  BUN 8 10  CREATININE 0.70 0.75  CALCIUM 9.2 9.5  MG  --  2.1    Liver Function Tests: Recent Labs  Lab 10/26/21 0900  AST 19  ALT 14  ALKPHOS 63  BILITOT 0.5  PROT 6.5  ALBUMIN 4.1     Radiology Studies: No results found.    LOS: 0 days    Flora Lipps, MD [facility] Available via Epic secure chat 7am-7pm After these hours, please refer to coverage provider listed on [email or web address] 10/30/2021, 11:42 AM

## 2021-10-31 ENCOUNTER — Encounter (HOSPITAL_COMMUNITY): Payer: Self-pay | Admitting: Internal Medicine

## 2021-10-31 DIAGNOSIS — M47814 Spondylosis without myelopathy or radiculopathy, thoracic region: Secondary | ICD-10-CM | POA: Diagnosis not present

## 2021-10-31 DIAGNOSIS — R339 Retention of urine, unspecified: Secondary | ICD-10-CM | POA: Diagnosis not present

## 2021-10-31 DIAGNOSIS — R269 Unspecified abnormalities of gait and mobility: Secondary | ICD-10-CM | POA: Diagnosis not present

## 2021-10-31 DIAGNOSIS — Z79891 Long term (current) use of opiate analgesic: Secondary | ICD-10-CM | POA: Diagnosis not present

## 2021-10-31 DIAGNOSIS — G8929 Other chronic pain: Secondary | ICD-10-CM | POA: Diagnosis not present

## 2021-10-31 DIAGNOSIS — M25511 Pain in right shoulder: Secondary | ICD-10-CM | POA: Diagnosis not present

## 2021-10-31 DIAGNOSIS — Z7984 Long term (current) use of oral hypoglycemic drugs: Secondary | ICD-10-CM | POA: Diagnosis not present

## 2021-10-31 DIAGNOSIS — E1142 Type 2 diabetes mellitus with diabetic polyneuropathy: Secondary | ICD-10-CM | POA: Diagnosis not present

## 2021-10-31 DIAGNOSIS — G4733 Obstructive sleep apnea (adult) (pediatric): Secondary | ICD-10-CM | POA: Diagnosis not present

## 2021-10-31 DIAGNOSIS — R296 Repeated falls: Secondary | ICD-10-CM | POA: Diagnosis not present

## 2021-10-31 DIAGNOSIS — M8580 Other specified disorders of bone density and structure, unspecified site: Secondary | ICD-10-CM

## 2021-10-31 DIAGNOSIS — K760 Fatty (change of) liver, not elsewhere classified: Secondary | ICD-10-CM | POA: Diagnosis not present

## 2021-10-31 DIAGNOSIS — E1140 Type 2 diabetes mellitus with diabetic neuropathy, unspecified: Secondary | ICD-10-CM | POA: Diagnosis not present

## 2021-10-31 DIAGNOSIS — R10819 Abdominal tenderness, unspecified site: Secondary | ICD-10-CM | POA: Diagnosis not present

## 2021-10-31 DIAGNOSIS — R262 Difficulty in walking, not elsewhere classified: Secondary | ICD-10-CM | POA: Diagnosis not present

## 2021-10-31 DIAGNOSIS — Z79899 Other long term (current) drug therapy: Secondary | ICD-10-CM | POA: Diagnosis not present

## 2021-10-31 DIAGNOSIS — I700 Atherosclerosis of aorta: Secondary | ICD-10-CM | POA: Diagnosis not present

## 2021-10-31 DIAGNOSIS — Z87891 Personal history of nicotine dependence: Secondary | ICD-10-CM | POA: Diagnosis not present

## 2021-10-31 DIAGNOSIS — J449 Chronic obstructive pulmonary disease, unspecified: Secondary | ICD-10-CM | POA: Diagnosis not present

## 2021-10-31 DIAGNOSIS — K219 Gastro-esophageal reflux disease without esophagitis: Secondary | ICD-10-CM | POA: Diagnosis not present

## 2021-10-31 DIAGNOSIS — M8588 Other specified disorders of bone density and structure, other site: Secondary | ICD-10-CM | POA: Diagnosis not present

## 2021-10-31 DIAGNOSIS — R338 Other retention of urine: Secondary | ICD-10-CM | POA: Diagnosis not present

## 2021-10-31 DIAGNOSIS — I10 Essential (primary) hypertension: Secondary | ICD-10-CM | POA: Diagnosis not present

## 2021-10-31 DIAGNOSIS — N3289 Other specified disorders of bladder: Secondary | ICD-10-CM | POA: Diagnosis not present

## 2021-10-31 DIAGNOSIS — R1030 Lower abdominal pain, unspecified: Secondary | ICD-10-CM | POA: Diagnosis not present

## 2021-10-31 DIAGNOSIS — K5900 Constipation, unspecified: Secondary | ICD-10-CM | POA: Diagnosis not present

## 2021-10-31 DIAGNOSIS — R2689 Other abnormalities of gait and mobility: Secondary | ICD-10-CM | POA: Diagnosis not present

## 2021-10-31 HISTORY — DX: Atherosclerosis of aorta: I70.0

## 2021-10-31 HISTORY — DX: Fatty (change of) liver, not elsewhere classified: K76.0

## 2021-10-31 LAB — GLUCOSE, CAPILLARY
Glucose-Capillary: 169 mg/dL — ABNORMAL HIGH (ref 70–99)
Glucose-Capillary: 208 mg/dL — ABNORMAL HIGH (ref 70–99)

## 2021-10-31 MED ORDER — VITAMIN D3 25 MCG PO TABS: 1000.0000 [IU] | ORAL_TABLET | Freq: Every day | ORAL | Status: AC

## 2021-10-31 MED ORDER — CYANOCOBALAMIN 1000 MCG PO TABS
1000.0000 ug | ORAL_TABLET | Freq: Every day | ORAL | Status: AC
Start: 2021-11-01 — End: ?

## 2021-10-31 NOTE — Assessment & Plan Note (Signed)
--   Stable.  Continue duloxetine, Lamictal, Lyrica

## 2021-10-31 NOTE — Assessment & Plan Note (Signed)
--  multiple stools recorded.  Exam benign.  Continue bowel regimen.

## 2021-10-31 NOTE — Progress Notes (Signed)
Pt refused BIPAP machine for tonight

## 2021-10-31 NOTE — Assessment & Plan Note (Signed)
--   Without exacerbation.  Continue albuterol inhaler.

## 2021-10-31 NOTE — Assessment & Plan Note (Signed)
Stable. Continue lisinopril

## 2021-10-31 NOTE — Assessment & Plan Note (Signed)
--   With associated generalized weakness and multiple falls, thought multifactorial including diabetic polyneuropathy, chronic pain secondary to degenerative joint disease of the back, polypharmacy including diazepam and chronic narcotics.  B12 level, vitamin D levels borderline low, started on replacement treatment.  Diazepam was held.  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 -- Seen by therapy with recommendation for SNF.

## 2021-10-31 NOTE — TOC Transition Note (Addendum)
Transition of Care [facility]) - CM/SW Discharge Note   Patient Details  Name: Cynthia Deleon MRN: [id number] Date of Birth: 01/10/1956  Transition of Care [facility]) CM/SW Contact:  Salome Arnt, LCSW Phone Number: 10/31/2021, 10:39 AM   Clinical Narrative:  Pt d/c today. Per MD, SNF no longer needed. [employer] updated. Pt reports she plans to return home and does not feel she needs home health. TOC signing off.      Final next level of care: Home/Self Care Barriers to Discharge: Barriers Resolved   Patient Goals and CMS Choice        Discharge Placement                    Patient and family notified of of transfer: 10/31/21  Discharge Plan and Services                          HH Arranged: Refused HH          Social Determinants of Health (SDOH) Interventions     Readmission Risk Interventions     View : No data to display.

## 2021-10-31 NOTE — Assessment & Plan Note (Signed)
resolved 

## 2021-10-31 NOTE — Assessment & Plan Note (Signed)
statin

## 2021-10-31 NOTE — Progress Notes (Signed)
Nsg Discharge Note  Admit Date:  10/26/2021 Discharge date: 10/31/2021   Cynthia Deleon to be D/C'd Home per MD order.  AVS completed.  Patient/caregiver able to verbalize understanding.  Discharge Medication: Allergies as of 10/31/2021       Reactions   Chantix [varenicline] Other (See Comments)   Altered mental status-Per patient was put on allergy list by Dr Lorriane Shire bu patient staes she is not allergic to this and is currently taking it.    Divalproex Sodium Other (See Comments)   Hallucinations  Other reaction(s): Confusion   Other Anaphylaxis   Pseudoeph-hydrocodone-gg Nausea Only   Sulfa Antibiotics Anaphylaxis, Nausea Only, Swelling, Other (See Comments)   Swelling of tongue.   Varenicline Tartrate Other (See Comments)   Other reaction(s): Confusion   Ambien [zolpidem] Other (See Comments)   Causes sleep walking   Clavulanic Acid Diarrhea   Codeine Nausea And Vomiting, Other (See Comments)   Elemental Sulfur Other (See Comments)   Swelling of tongue   Hydrocod Poli-chlorphe Poli Er Other (See Comments)   Other reaction(s): Skin itches   Hydrocodone Nausea And Vomiting   Macrobid [nitrofurantoin] Nausea And Vomiting   Dizziness, nausea, vomiting   Paxil [paroxetine Hcl] Other (See Comments)   Causes ringing in the ears.    Prednisone Other (See Comments)   Other reaction(s): Unknown   Amoxicillin Rash   Has patient had a PCN reaction causing immediate rash, facial/tongue/throat swelling, SOB or lightheadedness with hypotension: No Has patient had a PCN reaction causing severe rash involving mucus membranes or skin necrosis: No Has patient had a PCN reaction that required hospitalization: No Has patient had a PCN reaction occurring within the last 10 years: Yes If all of the above answers are "NO", then may proceed with Cephalosporin use.   Farxiga [dapagliflozin]    RECURRENT YEAST/UTI   Metformin And Related Diarrhea   Tape Rash        Medication List      STOP taking these medications    diclofenac 75 MG EC tablet Commonly known as: VOLTAREN   loratadine 10 MG tablet Commonly known as: CLARITIN   Trulicity 3 LG/9.2JJ Sopn Generic drug: Dulaglutide       TAKE these medications    albuterol 108 (90 Base) MCG/ACT inhaler Commonly known as: VENTOLIN HFA Inhale 2 puffs into the lungs every 6 (six) hours as needed.   atorvastatin 40 MG tablet Commonly known as: LIPITOR Take 1 tablet (40 mg total) by mouth daily.   azelastine 0.1 % nasal spray Commonly known as: ASTELIN Place 1 spray into both nostrils 2 (two) times daily. Use in each nostril as directed   Contour Next Test test strip Generic drug: glucose blood Test BS 4 times daily Dx E11.9   cyanocobalamin 1000 MCG tablet Take 1 tablet (1,000 mcg total) by mouth daily. Start taking on: Nov 01, 2021   diazepam 5 MG tablet Commonly known as: VALIUM Take 5 mg by mouth in the morning and at bedtime.   DULoxetine 60 MG capsule Commonly known as: Cymbalta Take 1 capsule (60 mg total) by mouth daily.   fluticasone 50 MCG/ACT nasal spray Commonly known as: FLONASE USE 2 SPRAYS IN EACH NOSTRIL ONCE DAILY.   glipiZIDE 5 MG 24 hr tablet Commonly known as: GLUCOTROL XL Take 1 tablet (5 mg total) by mouth 2 (two) times daily with a meal.   Januvia 100 MG tablet Generic drug: sitaGLIPtin TAKE (1) TABLET BY MOUTH ONCE DAILY.  lamoTRIgine 200 MG tablet Commonly known as: LAMICTAL Take 200 mg by mouth at bedtime. For mood disorder.   lansoprazole 30 MG capsule Commonly known as: PREVACID Take 1 capsule (30 mg total) by mouth daily at 12 noon.   lisinopril 5 MG tablet Commonly known as: ZESTRIL Take 1 tablet (5 mg total) by mouth daily.   meclizine 25 MG tablet Commonly known as: ANTIVERT Take 2 tablets (50 mg total) by mouth daily as needed for dizziness.   montelukast 10 MG tablet Commonly known as: SINGULAIR TAKE (1) TABLET BY MOUTH AT BEDTIME.   OneTouch  Delica Lancets 36U Misc Use to test blood sugar twice daily. DX: E11.9   oxybutynin 15 MG 24 hr tablet Commonly known as: DITROPAN XL Take 1 tablet (15 mg total) by mouth at bedtime.   oxyCODONE 15 MG immediate release tablet Commonly known as: ROXICODONE Take 15 mg by mouth every 6 (six) hours as needed.   pregabalin 150 MG capsule Commonly known as: LYRICA Take 1 capsule (150 mg total) by mouth 3 (three) times daily.   Premarin 0.3 MG tablet Generic drug: estrogens (conjugated) TAKE (1) TABLET BY MOUTH ONCE DAILY.   promethazine 12.5 MG tablet Commonly known as: PHENERGAN Take 1 tablet (12.5 mg total) by mouth every 8 (eight) hours as needed for nausea or vomiting.   valACYclovir 500 MG tablet Commonly known as: VALTREX TAKE (1) TABLET BY MOUTH ONCE DAILY AS NEEDED FOR SHINGLES FLARE UP.   Vitamin D3 25 MCG tablet Commonly known as: Vitamin D Take 1 tablet (1,000 Units total) by mouth daily. Start taking on: Nov 01, 2021        Discharge Assessment: Vitals:   10/30/21 2050 10/31/21 0509  BP: (!) 114/59 118/64  Pulse: 80 74  Resp: 20 18  Temp: 98.2 F (36.8 C) 97.8 F (36.6 C)  SpO2: 96% 94%   Skin clean, dry and intact without evidence of skin break down, no evidence of skin tears noted. IV catheter discontinued intact. Site without signs and symptoms of complications - no redness or edema noted at insertion site, patient denies c/o pain - only slight tenderness at site.  Dressing with slight pressure applied.  D/c Instructions-Education: Discharge instructions given to patient/family with verbalized understanding. D/c education completed with patient/family including follow up instructions, medication list, d/c activities limitations if indicated, with other d/c instructions as indicated by MD - patient able to verbalize understanding, all questions fully answered. Patient instructed to return to ED, call 911, or call MD for any changes in condition.  Patient  escorted via [place], and D/C home via private auto.  Kathie Rhodes, RN 10/31/2021 10:40 AM

## 2021-10-31 NOTE — Assessment & Plan Note (Signed)
--   CBG stable.  Continue Trulicity, Januvia and glipizide on discharge.  Continue Lyrica.

## 2021-10-31 NOTE — Progress Notes (Signed)
Inpatient Diabetes Program Recommendations  AACE/ADA: New Consensus Statement on Inpatient Glycemic Control   Target Ranges:  Prepandial:   less than 140 mg/dL      Peak postprandial:   less than 180 mg/dL (1-2 hours)      Critically ill patients:  140 - 180 mg/dL    Latest Reference Range & Units 10/31/21 07:29  Glucose-Capillary 70 - 99 mg/dL 169 (H)    Latest Reference Range & Units 10/29/21 07:47 10/29/21 11:33 10/29/21 16:50 10/29/21 20:47 10/30/21 11:21 10/30/21 20:46  Glucose-Capillary 70 - 99 mg/dL 176 (H) 251 (H) 157 (H) 210 (H) 257 (H) 240 (H)   Review of Glycemic Control  Diabetes history: DM2 Outpatient Diabetes medications: Glipizide XL 5 mg BID, Trulicity 3 mg Qweek, Januvia 100 mg daily Current orders for Inpatient glycemic control: Novolog 0-9 units TID with meals, Novolog 0-5 units QHS   Inpatient Diabetes Program Recommendations:     Insulin: Please consider ordering Novolog 5 units TID with meals for meal coverage if patient eats at least 50% of meals.   Thanks, Barnie Alderman, RN, MSN, [place] Diabetes Coordinator Inpatient Diabetes Program [phone] (Team Pager from 8am to [place])

## 2021-10-31 NOTE — Assessment & Plan Note (Signed)
--  follow-up as outpatient

## 2021-10-31 NOTE — Discharge Summary (Signed)
Physician Discharge Summary   Patient: Cynthia Deleon MRN: [id number] DOB: June 01, 1956  Admit date:     10/26/2021  Discharge date: 10/31/21  Discharge Physician: Murray Hodgkins   PCP: Loman Brooklyn, FNP   Recommendations at discharge:   * Ambulatory dysfunction -- Likely multifactorial including diabetic polyneuropathy, chronic pain secondary to degenerative joint disease of the back, polypharmacy including diazepam and chronic narcotics.  B12 level, vitamin D levels borderline low, started on replacement treatment.  Diazepam was held but is a chronic BID med; will resume on discharge, patient advised this can increase fall risks and she should discuss long-term treatment with her physician.   Right shoulder pain. --Appears to be muscular in nature.  Expect spontaneous resolution.    Polyneuropathy due to type 2 diabetes mellitus (HCC) -- CBG stable.  Continue Januvia and glipizide on discharge.  Patient refusing to take Trulicity secondary to irritable bowel.  She has not yet discussed this with her physician.  I advised her to discuss with her physician.  Continue Lyrica.  Acute urinary retention resolved.  Essential hypertension -- Stable.  Continue lisinopril.  Hepatic steatosis --follow-up as outpatient  Aortic atherosclerosis (HCC) --statin  Discharge Diagnoses: Principal Problem:   Ambulatory dysfunction Active Problems:   Falls frequently   COPD (chronic obstructive pulmonary disease) (HCC)   Constipation   Degeneration of lumbar intervertebral disc   Essential hypertension   Polyneuropathy due to type 2 diabetes mellitus (HCC)   Bipolar 1 disorder (HCC)   Osteopenia   Aortic atherosclerosis (HCC)   Hepatic steatosis   Current use of estrogen therapy   GERD (gastroesophageal reflux disease)   Gait abnormality   Generalized weakness  Resolved Problems:   Acute urinary retention  Hospital Course: 66 year old woman presenting from home with generalized  weakness and multiple falls as well as constipation.  PMH includes chronic back pain on chronic pain medication.  Admitted for concern of frequent falls and generalized weakness, concern for safety.  Seen by physical therapy with recommendation for SNF.  Hospitalization complicated by acute urinary retention and constipation. Condition improved rapidly; generalized weakness resolved, ambulating independently without difficulty in the hallway.  No longer meets SNF criteria.  No indication for home health.  Discharged home in good condition.  * Ambulatory dysfunction -- With associated generalized weakness and multiple falls, thought multifactorial including diabetic polyneuropathy, chronic pain secondary to degenerative joint disease of the back, polypharmacy including diazepam and chronic narcotics.  B12 level, vitamin D levels borderline low, started on replacement treatment.  Diazepam was held but is a chronic BID med; will resume on discharge, patient advised this can increase fall risks and she should discuss long-term treatment with her physician.  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 -- Seen by therapy with recommendation for SNF initially but patient improved rapidly.  On exam today she ambulated independently in the hallway with me with excellent stability and normal gait.  No indication for SNF or home health.  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  Right shoulder pain. --Appears to be muscular in nature.  Expect spontaneous resolution.  X-rays negative x2.  Exam benign.  Has good range of movement overall with some limited by pain.  Touches head without difficulty.  Tenderness throughout shoulder muscles into back muscles and triceps.  Constipation --multiple stools recorded.  Exam benign.  Now  having diarrhea.  Stop bowel  regimen.  COPD (chronic obstructive pulmonary disease) (HCC) -- Without exacerbation.  Continue albuterol inhaler.  Bipolar 1 disorder (HCC) -- Stable.  Continue duloxetine, Lamictal, Lyrica  Polyneuropathy due to type 2 diabetes mellitus (HCC) -- CBG stable.  Continue Januvia and glipizide on discharge.  Patient refusing to take Trulicity secondary to irritable bowel.  She has not yet discussed this with her physician.  I advised her to do so.  Continue Lyrica.  Acute urinary retention resolved.  Essential hypertension -- Stable.  Continue lisinopril.  Hepatic steatosis --follow-up as outpatient  Aortic atherosclerosis ([place]) --statin     Consultants: None Procedures performed: None  Disposition: Home Diet recommendation:  Carb modified diet DISCHARGE MEDICATION: Allergies as of 10/31/2021       Reactions   Chantix [varenicline] Other (See Comments)   Altered mental status-Per patient was put on allergy list by Dr Lorriane Shire bu patient staes she is not allergic to this and is currently taking it.    Divalproex Sodium Other (See Comments)   Hallucinations  Other reaction(s): Confusion   Other Anaphylaxis   Pseudoeph-hydrocodone-gg Nausea Only   Sulfa Antibiotics Anaphylaxis, Nausea Only, Swelling, Other (See Comments)   Swelling of tongue.   Varenicline Tartrate Other (See Comments)   Other reaction(s): Confusion   Ambien [zolpidem] Other (See Comments)   Causes sleep walking   Clavulanic Acid Diarrhea   Codeine Nausea And Vomiting, Other (See Comments)   Elemental Sulfur Other (See Comments)   Swelling of tongue   Hydrocod Poli-chlorphe Poli Er Other (See Comments)   Other reaction(s): Skin itches   Hydrocodone Nausea And Vomiting   Macrobid [nitrofurantoin] Nausea And Vomiting   Dizziness, nausea, vomiting   Paxil [paroxetine Hcl] Other (See Comments)   Causes ringing in the ears.    Prednisone Other (See Comments)   Other reaction(s): Unknown   Amoxicillin  Rash   Has patient had a PCN reaction causing immediate rash, facial/tongue/throat swelling, SOB or lightheadedness with hypotension: No Has patient had a PCN reaction causing severe rash involving mucus membranes or skin necrosis: No Has patient had a PCN reaction that required hospitalization: No Has patient had a PCN reaction occurring within the last 10 years: Yes If all of the above answers are "NO", then may proceed with Cephalosporin use.   Farxiga [dapagliflozin]    RECURRENT YEAST/UTI   Metformin And Related Diarrhea   Tape Rash        Medication List     STOP taking these medications    diclofenac 75 MG EC tablet Commonly known as: VOLTAREN   loratadine 10 MG tablet Commonly known as: CLARITIN   Trulicity 3 JI/9.6VE Sopn Generic drug: Dulaglutide       TAKE these medications    albuterol 108 (90 Base) MCG/ACT inhaler Commonly known as: VENTOLIN HFA Inhale 2 puffs into the lungs every 6 (six) hours as needed.   atorvastatin 40 MG tablet Commonly known as: LIPITOR Take 1 tablet (40 mg total) by mouth daily.   azelastine 0.1 % nasal spray Commonly known as: ASTELIN Place 1 spray into both nostrils 2 (two) times daily. Use in each nostril as directed   Contour Next Test test strip Generic drug: glucose blood Test BS 4 times daily Dx E11.9   cyanocobalamin 1000 MCG tablet Take 1 tablet (1,000 mcg total) by mouth daily. Start taking on: Nov 01, 2021   diazepam 5 MG tablet Commonly known as: VALIUM Take 5 mg  by mouth in the morning and at bedtime.   DULoxetine 60 MG capsule Commonly known as: Cymbalta Take 1 capsule (60 mg total) by mouth daily.   fluticasone 50 MCG/ACT nasal spray Commonly known as: FLONASE USE 2 SPRAYS IN EACH NOSTRIL ONCE DAILY.   glipiZIDE 5 MG 24 hr tablet Commonly known as: GLUCOTROL XL Take 1 tablet (5 mg total) by mouth 2 (two) times daily with a meal.   Januvia 100 MG tablet Generic drug: sitaGLIPtin TAKE (1) TABLET  BY MOUTH ONCE DAILY.   lamoTRIgine 200 MG tablet Commonly known as: LAMICTAL Take 200 mg by mouth at bedtime. For mood disorder.   lansoprazole 30 MG capsule Commonly known as: PREVACID Take 1 capsule (30 mg total) by mouth daily at 12 noon.   lisinopril 5 MG tablet Commonly known as: ZESTRIL Take 1 tablet (5 mg total) by mouth daily.   meclizine 25 MG tablet Commonly known as: ANTIVERT Take 2 tablets (50 mg total) by mouth daily as needed for dizziness.   montelukast 10 MG tablet Commonly known as: SINGULAIR TAKE (1) TABLET BY MOUTH AT BEDTIME.   OneTouch Delica Lancets 37J Misc Use to test blood sugar twice daily. DX: E11.9   oxybutynin 15 MG 24 hr tablet Commonly known as: DITROPAN XL Take 1 tablet (15 mg total) by mouth at bedtime.   oxyCODONE 15 MG immediate release tablet Commonly known as: ROXICODONE Take 15 mg by mouth every 6 (six) hours as needed.   pregabalin 150 MG capsule Commonly known as: LYRICA Take 1 capsule (150 mg total) by mouth 3 (three) times daily.   Premarin 0.3 MG tablet Generic drug: estrogens (conjugated) TAKE (1) TABLET BY MOUTH ONCE DAILY.   promethazine 12.5 MG tablet Commonly known as: PHENERGAN Take 1 tablet (12.5 mg total) by mouth every 8 (eight) hours as needed for nausea or vomiting.   valACYclovir 500 MG tablet Commonly known as: VALTREX TAKE (1) TABLET BY MOUTH ONCE DAILY AS NEEDED FOR SHINGLES FLARE UP.   Vitamin D3 25 MCG tablet Commonly known as: Vitamin D Take 1 tablet (1,000 Units total) by mouth daily. Start taking on: Nov 01, 2021        Contact information for after-discharge care     [facility] .   Service: Skilled Nursing Contact information: [address] [place] [phone]                   Nurse note yesterday notes patient walking independently and wanted to go home on discharge.  Feels well  today, having diarrhea but otherwise doing well.  Still has right shoulder pain which seems to be muscular in nature.  Ambulating without difficulty. Discharge Exam: Filed Weights   10/28/21 0300 10/30/21 0500 10/31/21 0509  Weight: 79.5 kg 80.1 kg 80.2 kg   Physical Exam Vitals and nursing note reviewed.  Constitutional:      General: She is not in acute distress.    Appearance: She is not ill-appearing or toxic-appearing.  Cardiovascular:     Rate and Rhythm: Normal rate and regular rhythm.     Heart sounds: No murmur heard. Pulmonary:     Effort: Pulmonary effort is normal. No respiratory distress.     Breath sounds: No wheezing, rhonchi or rales.  Abdominal:     General: There is no distension.     Tenderness: There is no abdominal tenderness.  Neurological:  Mental Status: She is alert.     Gait: Gait normal.     Comments: Ambulated more than 100 feet in the hallway without difficulty or loss of balance.  Excellent gait and stability.  Psychiatric:        Mood and Affect: Mood normal.        Behavior: Behavior normal.     Condition at discharge: good  The results of significant diagnostics from this hospitalization (including imaging, microbiology, ancillary and laboratory) are listed below for reference.   Imaging Studies: DG Chest 2 View  Result Date: 10/26/2021 CLINICAL DATA:  Pain after fall. EXAM: CHEST - 2 VIEW COMPARISON:  None Available. FINDINGS: The heart size and mediastinal contours are within normal limits. Both lungs are clear. Osteopenia and mild thoracic spondylosis. No appreciable fracture. IMPRESSION: No active cardiopulmonary disease. No appreciable fracture. Thoracic spondylosis. Electronically Signed   By: Keane Police D.O.   On: 10/26/2021 09:51   DG Shoulder Right  Result Date: 10/30/2021 CLINICAL DATA:  Fall EXAM: RIGHT SHOULDER - 2+ VIEW COMPARISON:  10/26/2021 FINDINGS: There is no evidence of fracture or dislocation. There is no evidence of  arthropathy or other focal bone abnormality. Soft tissues are unremarkable. IMPRESSION: Negative. Electronically Signed   By: Franchot Gallo M.D.   On: 10/30/2021 12:34   DG Shoulder Right  Result Date: 10/26/2021 CLINICAL DATA:  Fall EXAM: RIGHT SHOULDER - 2+ VIEW COMPARISON:  None Available. FINDINGS: There is no acute fracture or dislocation. Glenohumeral and acromioclavicular alignment is maintained. The joint spaces are preserved. The soft tissues are unremarkable. IMPRESSION: No acute fracture or dislocation. Electronically Signed   By: Valetta Mole M.D.   On: 10/26/2021 09:52   CT HEAD WO CONTRAST (5MM)  Result Date: 10/27/2021 CLINICAL DATA:  66 year old female with frequent falls. Weakness. EXAM: CT HEAD WITHOUT CONTRAST TECHNIQUE: Contiguous axial images were obtained from the base of the skull through the vertex without intravenous contrast. RADIATION DOSE REDUCTION: This exam was performed according to the departmental dose-optimization program which includes automated exposure control, adjustment of the mA and/or kV according to patient size and/or use of iterative reconstruction technique. COMPARISON:  Brain MRI 03/28/2020. Head CT 05/29/2017. FINDINGS: Brain: Cerebral volume remains normal for age. No midline shift, ventriculomegaly, mass effect, evidence of mass lesion, intracranial hemorrhage or evidence of cortically based acute infarction. Gray-white matter differentiation is stable and normal for age. No encephalomalacia identified. Vascular: Calcified atherosclerosis at the skull base. Skull: Chronic osteopenia.  No acute osseous abnormality identified. Sinuses/Orbits: Tympanic cavity and Visualized paranasal sinuses and mastoids are stable and well aerated. Other: No acute orbit or scalp soft tissue finding. IMPRESSION: Stable and normal for age non contrast CT appearance of the brain. No recent traumatic injury identified. Electronically Signed   By: Genevie Ann M.D.   On: 10/27/2021 05:05    CT LUMBAR SPINE WO CONTRAST  Result Date: 10/27/2021 CLINICAL DATA:  66 year old female with low back pain. Extremity weakness. Multiple recent falls. EXAM: CT LUMBAR SPINE WITHOUT CONTRAST TECHNIQUE: Multidetector CT imaging of the lumbar spine was performed without intravenous contrast administration. Multiplanar CT image reconstructions were also generated. RADIATION DOSE REDUCTION: This exam was performed according to the departmental dose-optimization program which includes automated exposure control, adjustment of the mA and/or kV according to patient size and/or use of iterative reconstruction technique. COMPARISON:  Lumbar MRI 01/01/2019. CT Chest, Abdomen, and Pelvis 10/26/2021. FINDINGS: Segmentation: Normal, the same numbering system used on the 2020 MRI. Alignment: Mildly improved  lumbar lordosis from the previous MRI, less straightening. Mild chronic degenerative appearing retrolisthesis of L5 on S1 is stable. Vertebrae: Diffuse osteopenia. Visible lower thoracic levels appear intact. 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 Lumbar levels appear intact. Visible sacrum and SI joints appear intact. No acute osseous abnormality identified. Paraspinal and other soft tissues: Respiratory motion at the lung bases. Aortoiliac calcified atherosclerosis. Normal caliber abdominal aorta. Vicarious contrast excretion into the gallbladder. Negative visible other noncontrast abdominal and pelvic viscera. Negative lumbar paraspinal soft tissues. Disc levels: Capacious spinal canal. Lumbar spine degeneration largely stable from the 2020 MRI, notable for vacuum disc, foraminal disc osteophyte complex and mild facet hypertrophy in association with chronic L5-S1 retrolisthesis. However, there is a new left paracentral focus of gas in the ventral epidural space series 4, image 123 near the descending left S1 nerve root in the lateral recess. But no spinal stenosis. Mild to moderate left  and moderate to severe right L5 neural foraminal stenosis appears chronic and stable. IMPRESSION: 1. Osteopenia. No acute osseous abnormality in the lumbar spine. 2. Lumbar spine degeneration is generally mild for age and largely stable from a 2020 MRI, with capacious spinal canal and no evidence of spinal stenosis. However, chronic disc degeneration at L5-S1 in conjunction with retrolisthesis does appear progressed with evidence of a small disc herniation near the descending left S1 nerve roots in the lateral recess. Query left S1 radiculitis. 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 Electronically Signed   By: Genevie Ann M.D.   On: 10/27/2021 09:41   CT CHEST ABDOMEN PELVIS W CONTRAST  Result Date: 10/26/2021 CLINICAL DATA:  Lower abdominal pain with weakness x2 days. Fall 5 times in the past 2 days. EXAM: CT CHEST, ABDOMEN, AND PELVIS WITH CONTRAST TECHNIQUE: Multidetector CT imaging of the chest, abdomen and pelvis was performed following the standard protocol during bolus administration of intravenous contrast. RADIATION DOSE REDUCTION: This exam was performed according to the departmental dose-optimization program which includes automated exposure control, adjustment of the mA and/or kV according to patient size and/or use of iterative reconstruction technique. CONTRAST:  178m OMNIPAQUE IOHEXOL 300 MG/ML  SOLN COMPARISON:  CT April 30, 2019 FINDINGS: CT CHEST FINDINGS Cardiovascular: Aortic atherosclerosis without aneurysmal dilation of the thoracic aorta. No central pulmonary embolus on this nondedicated study. Normal size heart. No significant pericardial effusion/thickening. Coronary artery calcifications. Mediastinum/Nodes: No suspicious thyroid nodule. No supraclavicular adenopathy. No mediastinal fluid or hematoma. No pathologically enlarged mediastinal, hilar or axillary lymph nodes. Lungs/Pleura: Mild biapical pleuroparenchymal scarring. Hypoventilatory change in the  lung bases. Atelectasis in the right middle lobe and lingula. No evidence of parenchymal contusion or laceration. No pleural effusion. No pneumothorax. Musculoskeletal: Multilevel degenerative changes spine. No acute osseous abnormality. CT ABDOMEN PELVIS FINDINGS Hepatobiliary: Mild hepatic steatosis with focal fatty sparing along the gallbladder fossa. No suspicious hepatic lesion. No hepatic injury or peripancreatic hematoma. Gallbladder is unremarkable. No biliary ductal dilation. Pancreas: No pancreatic ductal dilation, acute inflammation or evidence of laceration. Spleen: No splenic injury or perisplenic hematoma. Adrenals/Urinary Tract: No adrenal hemorrhage or renal injury identified. No hydronephrosis. Kidneys demonstrate symmetric enhancement and excretion of contrast material. Bladder is significantly distended extending above the sacral promontory. Stomach/Bowel: No radiopaque enteric contrast material was administered. Stomach is minimally distended limiting evaluation. There is no pathologic dilation of small or large bowel. Terminal ileum appears normal. Normal appendix. Large volume of formed stool throughout the colon suggestive of constipation. No evidence of acute bowel inflammation.  Vascular/Lymphatic: Aortic atherosclerosis without aneurysmal dilation. No pathologically enlarged abdominal or pelvic lymph nodes. Reproductive: Status post hysterectomy. No adnexal masses. Other: No significant abdominopelvic free fluid. No abdominal wall herniation. Musculoskeletal: Multilevel degenerative changes spine. No acute osseous abnormality. IMPRESSION: 1. No evidence of acute traumatic injury in the chest, abdomen, or pelvis. 2. Large volume of formed stool throughout the colon suggestive of constipation. 3. Significantly distended urinary bladder extending above the sacral promontory. Correlate for urinary retention. 4. Hepatic steatosis. 5.  Aortic Atherosclerosis (ICD10-I70.0). Electronically Signed    By: Dahlia Bailiff M.D.   On: 10/26/2021 11:51   US Venous Img Lower Unilateral Right (DVT)  Result Date: 10/30/2021 CLINICAL DATA:  Right leg pain EXAM: Right LOWER EXTREMITY VENOUS DOPPLER ULTRASOUND TECHNIQUE: Gray-scale sonography with compression, as well as color and duplex ultrasound, were performed to evaluate the deep venous system(s) from the level of the common femoral vein through the popliteal and proximal calf veins. COMPARISON:  None Available. FINDINGS: VENOUS Normal compressibility of the common femoral, superficial femoral, and popliteal veins, as well as the visualized calf veins. Visualized portions of profunda femoral vein and great saphenous vein unremarkable. No filling defects to suggest DVT on grayscale or color Doppler imaging. Doppler waveforms show normal direction of venous flow, normal respiratory plasticity and response to augmentation. Limited views of the contralateral common femoral vein are unremarkable. OTHER None. Limitations: none IMPRESSION: Negative. Electronically Signed   By: Franchot Gallo M.D.   On: 10/30/2021 12:36   DG HIP UNILAT WITH PELVIS 2-3 VIEWS RIGHT  Result Date: 10/30/2021 CLINICAL DATA:  Fall EXAM: DG HIP (WITH OR WITHOUT PELVIS) 2-3V RIGHT COMPARISON:  None Available. FINDINGS: There is no evidence of hip fracture or dislocation. There is no evidence of arthropathy or other focal bone abnormality. IMPRESSION: Negative. Electronically Signed   By: Franchot Gallo M.D.   On: 10/30/2021 12:37    Microbiology: Results for orders placed or performed in visit on 09/13/21  Urine Culture     Status: Abnormal   Collection Time: 09/13/21  1:24 PM   Specimen: Urine   UR  Result Value Ref Range Status   Urine Culture, Routine Final report (A)  Final   Organism ID, Bacteria Enterococcus faecalis (A)  Final    Comment: For Enterococcus species, aminoglycosides (except for high-level resistance screening), cephalosporins, clindamycin,  and trimethoprim-sulfamethoxazole are not effective clinically. (CLSI, M100-S26, 2016) 10,000-25,000 colony forming units per mL    Antimicrobial Susceptibility Comment  Final    Comment:       ** S = Susceptible; I = Intermediate; R = Resistant **                    P = Positive; N = Negative             MICS are expressed in micrograms per mL    Antibiotic                 RSLT#1    RSLT#2    RSLT#3    RSLT#4 Ciprofloxacin                  S Levofloxacin                   S Nitrofurantoin                 S Penicillin  S Tetracycline                   R Vancomycin                     S     Labs: CBC: Recent Labs  Lab 10/26/21 0900 10/27/21 0522  WBC 8.1 7.4  NEUTROABS 5.4  --   HGB 14.9 14.5  HCT 45.6 43.2  MCV 96.0 96.6  PLT 216 630   Basic Metabolic Panel: Recent Labs  Lab 10/26/21 0900 10/27/21 0522  NA 141 138  K 4.1 4.0  CL 107 101  CO2 28 31  GLUCOSE 133* 156*  BUN 8 10  CREATININE 0.70 0.75  CALCIUM 9.2 9.5  MG  --  2.1   Liver Function Tests: Recent Labs  Lab 10/26/21 0900  AST 19  ALT 14  ALKPHOS 63  BILITOT 0.5  PROT 6.5  ALBUMIN 4.1   CBG: Recent Labs  Lab 10/29/21 1650 10/29/21 2047 10/30/21 1121 10/30/21 2046 10/31/21 0729  GLUCAP 157* 210* 257* 240* 169*    Discharge time spent: greater than 30 minutes.  Signed: Murray Hodgkins, MD [facility] 10/31/2021

## 2021-11-01 ENCOUNTER — Ambulatory Visit (HOSPITAL_COMMUNITY): Admission: RE | Admit: 2021-11-01 | Payer: Medicare Other | Source: Ambulatory Visit

## 2021-11-05 DIAGNOSIS — E1165 Type 2 diabetes mellitus with hyperglycemia: Secondary | ICD-10-CM | POA: Diagnosis not present

## 2021-11-06 LAB — GLUCOSE, CAPILLARY
Glucose-Capillary: 162 mg/dL — ABNORMAL HIGH (ref 70–99)
Glucose-Capillary: 353 mg/dL — ABNORMAL HIGH (ref 70–99)

## 2021-11-07 ENCOUNTER — Encounter: Payer: Self-pay | Admitting: Vascular Surgery

## 2021-11-07 ENCOUNTER — Ambulatory Visit (INDEPENDENT_AMBULATORY_CARE_PROVIDER_SITE_OTHER): Payer: Medicare Other | Admitting: Vascular Surgery

## 2021-11-07 ENCOUNTER — Other Ambulatory Visit (HOSPITAL_COMMUNITY): Payer: Self-pay | Admitting: Vascular Surgery

## 2021-11-07 ENCOUNTER — Ambulatory Visit: Payer: Medicare Other

## 2021-11-07 ENCOUNTER — Other Ambulatory Visit: Payer: Self-pay | Admitting: Vascular Surgery

## 2021-11-07 VITALS — BP 130/76 | HR 72 | Temp 97.7°F | Resp 16 | Ht 69.0 in | Wt 175.8 lb

## 2021-11-07 DIAGNOSIS — R6889 Other general symptoms and signs: Secondary | ICD-10-CM | POA: Diagnosis not present

## 2021-11-07 DIAGNOSIS — I739 Peripheral vascular disease, unspecified: Secondary | ICD-10-CM | POA: Diagnosis not present

## 2021-11-07 NOTE — Progress Notes (Signed)
[facility] [place]  Patient name: Cynthia Deleon MRN: [id number] DOB: 1955-07-28 Sex: female  REASON FOR CONSULT: Evaluation abnormal screening ABI and discuss lower extremity discomfort  HPI: Cynthia Deleon is a 66 y.o. female, who is here today for evaluation.  She reports that she had a insurance screen showing evidence of peripheral occlusive disease.  She is here for further discussion.  She does report several lower extremity issues.  She does have degenerative disc disease with chronic falling.  She does have some lower extremity swelling at the end of the day worse on the right than on the left.  She saw Dr. Trula Slade in January 2020.  Reflexivity at that time showed no evidence of reflux.  She does not wear compression.  She does not have any claudication symptoms and no history of lower extremity tissue loss.  Past Medical History:  Diagnosis Date   Aortic atherosclerosis ([place]) 10/31/2021   Arthritis    hands and knees   Bipolar 1 disorder (HCC)    Borderline diabetic    Chronic back pain    Chronic neck pain    Complication of anesthesia    high anxiety-does not want to be alone   COPD (chronic obstructive pulmonary disease) ([place])    Current use of estrogen therapy 01/12/2014   Depression    Diabetes mellitus without complication ([place])    Diplopia    Family history of adverse reaction to anesthesia    sister "gas in lungs"   GERD (gastroesophageal reflux disease)    Headache    Hepatic steatosis 10/31/2021   Hot flashes 12/15/2013   Hypertension    IBS (irritable bowel syndrome)    Incontinence    Kidney stone    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HCC)    Panic attacks    Peptic ulcer    Peripheral neuropathy    Rosacea    Shingles    Sleep apnea    uses a cpap-with oxygen    Family History  Problem Relation Age of Onset   Diabetes Mother    Other Mother        vertigo; chronic eye disease   COPD  Father    COPD Sister    Alcohol abuse Brother    Diabetes Maternal Grandmother    Diabetes Maternal Grandfather    Diabetes Sister    Other Sister        hearing problems   Hyperlipidemia Sister    Alcohol abuse Brother    COPD Brother    Alcohol abuse Brother    Alcohol abuse Brother    Other Brother        aneursym    SOCIAL HISTORY: Social History   Socioeconomic History   Marital status: Married    Spouse name: Not on file   Number of children: 5   Years of education: 9th   Highest education level: Not on file  Occupational History   Occupation: Disabled  Tobacco Use   Smoking status: Former    Packs/day: 1.00    Years: 42.00    Pack years: 42.00    Types: Cigarettes    Quit date: 06/10/2017    Years since quitting: 4.4   Smokeless tobacco: Never  Vaping Use   Vaping Use: Never used  Substance and Sexual Activity   Alcohol use: No    Alcohol/week: 0.0 standard drinks   Drug use: No   Sexual activity: Never    Birth control/protection:  Surgical    Comment: occ cigarette  Other Topics Concern   Not on file  Social History Narrative   Lives at home with home with her son (has five children).   Right-handed.   1 cup caffeine per day.   Social Determinants of Health   Financial Resource Strain: Not on file  Food Insecurity: Not on file  Transportation Needs: Not on file  Physical Activity: Not on file  Stress: Not on file  Social Connections: Not on file  Intimate Partner Violence: Not on file    Allergies  Allergen Reactions   Chantix [Varenicline] Other (See Comments)    Altered mental status-Per patient was put on allergy list by Dr Lorriane Shire bu patient staes she is not allergic to this and is currently taking it.    Divalproex Sodium Other (See Comments)    Hallucinations  Other reaction(s): Confusion   Other Anaphylaxis   Pseudoeph-Hydrocodone-Gg Nausea Only   Sulfa Antibiotics Anaphylaxis, Nausea Only, Swelling and Other (See Comments)     Swelling of tongue.   Varenicline Tartrate Other (See Comments)    Other reaction(s): Confusion   Ambien [Zolpidem] Other (See Comments)    Causes sleep walking   Clavulanic Acid Diarrhea   Codeine Nausea And Vomiting and Other (See Comments)   Elemental Sulfur Other (See Comments)    Swelling of tongue   Hydrocod Poli-Chlorphe Poli Er Other (See Comments)    Other reaction(s): Skin itches    Hydrocodone Nausea And Vomiting   Macrobid [Nitrofurantoin] Nausea And Vomiting    Dizziness, nausea, vomiting   Paxil [Paroxetine Hcl] Other (See Comments)    Causes ringing in the ears.    Prednisone Other (See Comments)    Other reaction(s): Unknown    Amoxicillin Rash    Has patient had a PCN reaction causing immediate rash, facial/tongue/throat swelling, SOB or lightheadedness with hypotension: No Has patient had a PCN reaction causing severe rash involving mucus membranes or skin necrosis: No Has patient had a PCN reaction that required hospitalization: No Has patient had a PCN reaction occurring within the last 10 years: Yes If all of the above answers are "NO", then may proceed with Cephalosporin use.    Farxiga [Dapagliflozin]     RECURRENT YEAST/UTI   Metformin And Related Diarrhea   Tape Rash    Current Outpatient Medications  Medication Sig Dispense Refill   albuterol (VENTOLIN HFA) 108 (90 Base) MCG/ACT inhaler Inhale 2 puffs into the lungs every 6 (six) hours as needed. 18 g 2   atorvastatin (LIPITOR) 40 MG tablet TAKE ONE TABLET BY MOUTH ONCE DAILY. 30 tablet 0   azelastine (ASTELIN) 0.1 % nasal spray Place 1 spray into both nostrils 2 (two) times daily. Use in each nostril as directed 30 mL 12   cholecalciferol (VITAMIN D) 25 MCG tablet Take 1 tablet (1,000 Units total) by mouth daily.     diazepam (VALIUM) 5 MG tablet Take 5 mg by mouth in the morning and at bedtime.      DULoxetine (CYMBALTA) 60 MG capsule Take 1 capsule (60 mg total) by mouth daily. 30 capsule 11    fluticasone (FLONASE) 50 MCG/ACT nasal spray USE 2 SPRAYS IN EACH NOSTRIL ONCE DAILY. 16 g 0   glipiZIDE (GLUCOTROL XL) 5 MG 24 hr tablet Take 1 tablet (5 mg total) by mouth 2 (two) times daily with a meal. 60 tablet 5   glucose blood (CONTOUR NEXT TEST) test strip Test BS 4 times daily  Dx E11.9 400 strip 3   JANUVIA 100 MG tablet TAKE (1) TABLET BY MOUTH ONCE DAILY. 30 tablet 0   lamoTRIgine (LAMICTAL) 200 MG tablet Take 200 mg by mouth at bedtime. For mood disorder.     lansoprazole (PREVACID) 30 MG capsule TAKE (1) CAPSULE BY MOUTH ONCE DAILY. 30 capsule 0   lisinopril (ZESTRIL) 5 MG tablet TAKE (1) TABLET BY MOUTH ONCE DAILY. 30 tablet 0   meclizine (ANTIVERT) 25 MG tablet Take 2 tablets (50 mg total) by mouth daily as needed for dizziness. 60 tablet 2   montelukast (SINGULAIR) 10 MG tablet TAKE (1) TABLET BY MOUTH AT BEDTIME. 90 tablet 1   OneTouch Delica Lancets 69G MISC Use to test blood sugar twice daily. DX: E11.9 100 each 3   oxybutynin (DITROPAN XL) 15 MG 24 hr tablet Take 1 tablet (15 mg total) by mouth at bedtime. 30 tablet 0   oxyCODONE (ROXICODONE) 15 MG immediate release tablet Take 15 mg by mouth every 6 (six) hours as needed.     pregabalin (LYRICA) 150 MG capsule Take 1 capsule (150 mg total) by mouth 3 (three) times daily. 90 capsule 2   PREMARIN 0.3 MG tablet TAKE (1) TABLET BY MOUTH ONCE DAILY. 30 tablet 5   promethazine (PHENERGAN) 12.5 MG tablet Take 1 tablet (12.5 mg total) by mouth every 8 (eight) hours as needed for nausea or vomiting. 20 tablet 0   vitamin B-12 1000 MCG tablet Take 1 tablet (1,000 mcg total) by mouth daily.     valACYclovir (VALTREX) 500 MG tablet TAKE (1) TABLET BY MOUTH ONCE DAILY AS NEEDED FOR SHINGLES FLARE UP. (Patient not taking: Reported on 10/26/2021) 30 tablet 0   No current facility-administered medications for this visit.    REVIEW OF SYSTEMS:  '[X]'$  denotes positive finding, '[ ]'$  denotes negative finding Cardiac  Comments:  Chest pain or  chest pressure:    Shortness of breath upon exertion: x   Short of breath when lying flat:    Irregular heart rhythm:        Vascular    Pain in calf, thigh, or hip brought on by ambulation:    Pain in feet at night that wakes you up from your sleep:  x   Blood clot in your veins:    Leg swelling:  x       Pulmonary    Oxygen at home: x   Productive cough:     Wheezing:         Neurologic    Sudden weakness in arms or legs:     Sudden numbness in arms or legs:     Sudden onset of difficulty speaking or slurred speech:    Temporary loss of vision in one eye:     Problems with dizziness:         Gastrointestinal    Blood in stool:     Vomited blood:         Genitourinary    Burning when urinating:     Blood in urine:        Psychiatric    Major depression:         Hematologic    Bleeding problems:    Problems with blood clotting too easily:        Skin    Rashes or ulcers:        Constitutional    Fever or chills:      PHYSICAL EXAM: Vitals:   11/07/21 1137  BP: 130/76  Pulse: 72  Resp: 16  Temp: 97.7 F (36.5 C)  TempSrc: Temporal  SpO2: 93%  Weight: 175 lb 12.8 oz (79.7 kg)  Height: '5\' 9"'$  (1.753 m)    GENERAL: The patient is a well-nourished female, in no acute distress. The vital signs are documented above. CARDIOVASCULAR: 2+ radial pulses bilaterally.  2+ popliteal and 2+ dorsalis pedis pulses bilaterally. PULMONARY: There is good air exchange  MUSCULOSKELETAL: There are no major deformities or cyanosis. NEUROLOGIC: No focal weakness or paresthesias are detected. SKIN: There are no ulcers or rashes noted. PSYCHIATRIC: The patient has a normal affect.  DATA:  Noninvasive studies today reveal normal ankle arm index and triphasic waveforms bilaterally  She did undergo CT scan of her chest abdomen and pelvis during a recent hospitalization at [facility] in May.  This shows diffuse atherosclerotic calcification of her aortoiliac segments but no  evidence of flow-limiting stenosis through her proximal SFA bilaterally  MEDICAL ISSUES: No evidence of lower extremity arterial occlusive disease.  Diffuse atherosclerotic disease.  She was reassured with these findings will see [place] again on an as-needed basis   Rosetta Posner, MD [facility] [phone] Pager [phone]  Note: Portions of this report may have been transcribed using voice recognition software.  Every effort has been made to ensure accuracy; however, inadvertent computerized transcription errors may still be present.

## 2021-11-12 ENCOUNTER — Encounter: Payer: Self-pay | Admitting: Family Medicine

## 2021-11-12 ENCOUNTER — Ambulatory Visit (INDEPENDENT_AMBULATORY_CARE_PROVIDER_SITE_OTHER): Payer: Medicare Other | Admitting: Family Medicine

## 2021-11-12 DIAGNOSIS — J309 Allergic rhinitis, unspecified: Secondary | ICD-10-CM | POA: Diagnosis not present

## 2021-11-12 MED ORDER — MONTELUKAST SODIUM 10 MG PO TABS: ORAL_TABLET | ORAL | 1 refills | Status: DC

## 2021-11-12 NOTE — Progress Notes (Signed)
   Acute Office Visit  Subjective:     [name], female    DOB: 03/23/1956, 65 y.o.   MRN: [id number]  Chief Complaint  Patient presents with   Cough   Sore Throat    X 1 week. Throat is not painful anymore just feels swollen on right side.     Cough  Sore Throat  Associated symptoms include coughing.  Patient is in today for a sore throat x 1 week. It has been intermittent. Also reports intermittent cough and nasal congestion. Cough is nonproductive. She denies increased shortness of breath, fever, chills, chest pain, or myalgias. She has been taking Flonase for her allergies. She has not been taking singular.   Review of Systems  Respiratory:  Positive for cough.   As per HPI     Objective:    BP 135/81   Pulse 82   Temp (!) 97 F (36.1 C) (Temporal)   Ht '5\' 9"'$  (1.753 m)   Wt 179 lb (81.2 kg)   SpO2 97%   BMI 26.43 kg/m    Physical Exam Vitals and nursing note reviewed.  Constitutional:      General: She is not in acute distress.    Appearance: She is not ill-appearing, toxic-appearing or diaphoretic.  HENT:     Head: Normocephalic and atraumatic.     Right Ear: Tympanic membrane and ear canal normal.     Left Ear: Tympanic membrane and ear canal normal.     Nose: No congestion.     Mouth/Throat:     Mouth: Mucous membranes are moist.     Pharynx: Oropharynx is clear.     Tonsils: No tonsillar exudate or tonsillar abscesses. 0 on the right. 0 on the left.  Eyes:     Conjunctiva/sclera: Conjunctivae normal.  Cardiovascular:     Rate and Rhythm: Normal rate and regular rhythm.     Heart sounds: Normal heart sounds. No murmur heard. Pulmonary:     Effort: No respiratory distress.     Breath sounds: Normal breath sounds. No wheezing, rhonchi or rales.  Musculoskeletal:     Cervical back: Neck supple.  Lymphadenopathy:     Cervical: No cervical adenopathy.  Skin:    General: Skin is warm and dry.  Neurological:     General: No focal  deficit present.     Mental Status: She is alert and oriented to person, place, and time.    No results found for any visits on 11/12/21.      Assessment & Plan:   Cynthia Deleon was seen today for cough and sore throat.  Diagnoses and all orders for this visit:  Chronic allergic rhinitis Not well controlled. She has been out of singular. Discussed to restart as below. Continue flonase. Benign exam today.  -     montelukast (SINGULAIR) 10 MG tablet; TAKE (1) TABLET BY MOUTH AT BEDTIME.  Return if symptoms worsen or fail to improve.  The patient indicates understanding of these issues and agrees with the plan.  Gwenlyn Perking, FNP

## 2021-11-13 ENCOUNTER — Encounter (HOSPITAL_COMMUNITY): Payer: Self-pay

## 2021-11-13 ENCOUNTER — Ambulatory Visit (HOSPITAL_COMMUNITY): Admit: 2021-11-13 | Payer: Medicare Other | Admitting: Gastroenterology

## 2021-11-13 SURGERY — COLONOSCOPY WITH PROPOFOL
Anesthesia: Monitor Anesthesia Care

## 2021-11-19 ENCOUNTER — Telehealth: Payer: Self-pay | Admitting: Family Medicine

## 2021-11-19 NOTE — Telephone Encounter (Signed)
Pt answered this call   Last night she woke during the night and it was 300  Checked while on the phone = 208   Ate today: 1 slice of pizza - thin crust, water, and one cup of coffee sugar free.   She is currently on januvia 100 mg daily and glipizide 5 mg bid   Appt for 6/20 moved up

## 2021-11-19 NOTE — Telephone Encounter (Signed)
Attempted to call pt back - no answer and no vm

## 2021-11-19 NOTE — Telephone Encounter (Signed)
Pt called stating that her BS is over 300. Please call pt to speak with her about this.

## 2021-11-20 ENCOUNTER — Telehealth: Payer: Self-pay | Admitting: Pharmacist

## 2021-11-20 DIAGNOSIS — E11649 Type 2 diabetes mellitus with hypoglycemia without coma: Secondary | ICD-10-CM

## 2021-11-20 MED ORDER — TRULICITY 0.75 MG/0.5ML ~~LOC~~ SOAJ: 0.7500 mg | SUBCUTANEOUS | 0 refills | Status: DC

## 2021-11-20 NOTE — Telephone Encounter (Signed)
Increase back to glipizide twice daily Restart trulicity low dose Bgs are in the 458-483 range since trulicity was decreased [place]

## 2021-11-21 ENCOUNTER — Emergency Department (HOSPITAL_COMMUNITY): Payer: Medicare Other

## 2021-11-21 ENCOUNTER — Observation Stay (HOSPITAL_COMMUNITY)
Admission: EM | Admit: 2021-11-21 | Discharge: 2021-11-23 | Disposition: A | Discharge status: Skilled Nursing Facility | Payer: Medicare Other | Attending: Internal Medicine | Admitting: Internal Medicine

## 2021-11-21 ENCOUNTER — Other Ambulatory Visit: Payer: Self-pay

## 2021-11-21 ENCOUNTER — Encounter (HOSPITAL_COMMUNITY): Payer: Self-pay | Admitting: Emergency Medicine

## 2021-11-21 DIAGNOSIS — F4481 Dissociative identity disorder: Secondary | ICD-10-CM | POA: Diagnosis not present

## 2021-11-21 DIAGNOSIS — H5509 Other forms of nystagmus: Secondary | ICD-10-CM | POA: Diagnosis not present

## 2021-11-21 DIAGNOSIS — R29818 Other symptoms and signs involving the nervous system: Secondary | ICD-10-CM | POA: Insufficient documentation

## 2021-11-21 DIAGNOSIS — Z825 Family history of asthma and other chronic lower respiratory diseases: Secondary | ICD-10-CM | POA: Insufficient documentation

## 2021-11-21 DIAGNOSIS — R404 Transient alteration of awareness: Secondary | ICD-10-CM | POA: Diagnosis not present

## 2021-11-21 DIAGNOSIS — R296 Repeated falls: Secondary | ICD-10-CM | POA: Diagnosis not present

## 2021-11-21 DIAGNOSIS — J189 Pneumonia, unspecified organism: Secondary | ICD-10-CM | POA: Insufficient documentation

## 2021-11-21 DIAGNOSIS — Z87891 Personal history of nicotine dependence: Secondary | ICD-10-CM | POA: Diagnosis not present

## 2021-11-21 DIAGNOSIS — Z8349 Family history of other endocrine, nutritional and metabolic diseases: Secondary | ICD-10-CM | POA: Diagnosis not present

## 2021-11-21 DIAGNOSIS — R2681 Unsteadiness on feet: Secondary | ICD-10-CM | POA: Insufficient documentation

## 2021-11-21 DIAGNOSIS — M6281 Muscle weakness (generalized): Secondary | ICD-10-CM | POA: Insufficient documentation

## 2021-11-21 DIAGNOSIS — J9811 Atelectasis: Secondary | ICD-10-CM | POA: Insufficient documentation

## 2021-11-21 DIAGNOSIS — Z79899 Other long term (current) drug therapy: Secondary | ICD-10-CM | POA: Diagnosis not present

## 2021-11-21 DIAGNOSIS — R0602 Shortness of breath: Secondary | ICD-10-CM | POA: Insufficient documentation

## 2021-11-21 DIAGNOSIS — Z833 Family history of diabetes mellitus: Secondary | ICD-10-CM | POA: Diagnosis not present

## 2021-11-21 DIAGNOSIS — R42 Dizziness and giddiness: Secondary | ICD-10-CM

## 2021-11-21 DIAGNOSIS — R4781 Slurred speech: Secondary | ICD-10-CM | POA: Diagnosis not present

## 2021-11-21 DIAGNOSIS — R059 Cough, unspecified: Secondary | ICD-10-CM | POA: Insufficient documentation

## 2021-11-21 DIAGNOSIS — Z79891 Long term (current) use of opiate analgesic: Secondary | ICD-10-CM | POA: Insufficient documentation

## 2021-11-21 DIAGNOSIS — M51369 Other intervertebral disc degeneration, lumbar region without mention of lumbar back pain or lower extremity pain: Secondary | ICD-10-CM | POA: Diagnosis present

## 2021-11-21 DIAGNOSIS — I499 Cardiac arrhythmia, unspecified: Secondary | ICD-10-CM | POA: Diagnosis not present

## 2021-11-21 DIAGNOSIS — G259 Extrapyramidal and movement disorder, unspecified: Secondary | ICD-10-CM | POA: Insufficient documentation

## 2021-11-21 DIAGNOSIS — R262 Difficulty in walking, not elsewhere classified: Secondary | ICD-10-CM | POA: Diagnosis present

## 2021-11-21 DIAGNOSIS — F319 Bipolar disorder, unspecified: Secondary | ICD-10-CM | POA: Diagnosis not present

## 2021-11-21 DIAGNOSIS — R0902 Hypoxemia: Secondary | ICD-10-CM | POA: Insufficient documentation

## 2021-11-21 DIAGNOSIS — Z9181 History of falling: Secondary | ICD-10-CM | POA: Insufficient documentation

## 2021-11-21 DIAGNOSIS — R531 Weakness: Secondary | ICD-10-CM | POA: Diagnosis not present

## 2021-11-21 DIAGNOSIS — R0689 Other abnormalities of breathing: Secondary | ICD-10-CM | POA: Insufficient documentation

## 2021-11-21 DIAGNOSIS — G9341 Metabolic encephalopathy: Secondary | ICD-10-CM | POA: Diagnosis not present

## 2021-11-21 DIAGNOSIS — E1165 Type 2 diabetes mellitus with hyperglycemia: Secondary | ICD-10-CM | POA: Insufficient documentation

## 2021-11-21 DIAGNOSIS — E1143 Type 2 diabetes mellitus with diabetic autonomic (poly)neuropathy: Secondary | ICD-10-CM | POA: Insufficient documentation

## 2021-11-21 DIAGNOSIS — Z7985 Long-term (current) use of injectable non-insulin antidiabetic drugs: Secondary | ICD-10-CM | POA: Insufficient documentation

## 2021-11-21 DIAGNOSIS — R0789 Other chest pain: Secondary | ICD-10-CM | POA: Insufficient documentation

## 2021-11-21 DIAGNOSIS — J449 Chronic obstructive pulmonary disease, unspecified: Secondary | ICD-10-CM | POA: Diagnosis not present

## 2021-11-21 DIAGNOSIS — G8929 Other chronic pain: Secondary | ICD-10-CM | POA: Insufficient documentation

## 2021-11-21 DIAGNOSIS — J440 Chronic obstructive pulmonary disease with acute lower respiratory infection: Secondary | ICD-10-CM | POA: Insufficient documentation

## 2021-11-21 DIAGNOSIS — R2990 Unspecified symptoms and signs involving the nervous system: Secondary | ICD-10-CM

## 2021-11-21 DIAGNOSIS — E1142 Type 2 diabetes mellitus with diabetic polyneuropathy: Secondary | ICD-10-CM | POA: Diagnosis present

## 2021-11-21 DIAGNOSIS — Z20822 Contact with and (suspected) exposure to covid-19: Secondary | ICD-10-CM | POA: Diagnosis not present

## 2021-11-21 DIAGNOSIS — M549 Dorsalgia, unspecified: Secondary | ICD-10-CM | POA: Diagnosis not present

## 2021-11-21 DIAGNOSIS — I6523 Occlusion and stenosis of bilateral carotid arteries: Secondary | ICD-10-CM | POA: Insufficient documentation

## 2021-11-21 DIAGNOSIS — M1990 Unspecified osteoarthritis, unspecified site: Secondary | ICD-10-CM | POA: Insufficient documentation

## 2021-11-21 DIAGNOSIS — R0989 Other specified symptoms and signs involving the circulatory and respiratory systems: Secondary | ICD-10-CM | POA: Insufficient documentation

## 2021-11-21 DIAGNOSIS — Z743 Need for continuous supervision: Secondary | ICD-10-CM | POA: Diagnosis not present

## 2021-11-21 DIAGNOSIS — R29810 Facial weakness: Secondary | ICD-10-CM | POA: Insufficient documentation

## 2021-11-21 DIAGNOSIS — N393 Stress incontinence (female) (male): Secondary | ICD-10-CM | POA: Diagnosis present

## 2021-11-21 DIAGNOSIS — I10 Essential (primary) hypertension: Secondary | ICD-10-CM | POA: Diagnosis not present

## 2021-11-21 DIAGNOSIS — R2689 Other abnormalities of gait and mobility: Secondary | ICD-10-CM | POA: Insufficient documentation

## 2021-11-21 DIAGNOSIS — R269 Unspecified abnormalities of gait and mobility: Secondary | ICD-10-CM | POA: Insufficient documentation

## 2021-11-21 DIAGNOSIS — R339 Retention of urine, unspecified: Secondary | ICD-10-CM | POA: Diagnosis not present

## 2021-11-21 DIAGNOSIS — M5030 Other cervical disc degeneration, unspecified cervical region: Secondary | ICD-10-CM | POA: Diagnosis present

## 2021-11-21 DIAGNOSIS — Z7984 Long term (current) use of oral hypoglycemic drugs: Secondary | ICD-10-CM | POA: Diagnosis not present

## 2021-11-21 DIAGNOSIS — M5136 Other intervertebral disc degeneration, lumbar region: Secondary | ICD-10-CM | POA: Insufficient documentation

## 2021-11-21 DIAGNOSIS — E1159 Type 2 diabetes mellitus with other circulatory complications: Secondary | ICD-10-CM | POA: Diagnosis present

## 2021-11-21 DIAGNOSIS — I152 Hypertension secondary to endocrine disorders: Secondary | ICD-10-CM | POA: Diagnosis present

## 2021-11-21 DIAGNOSIS — Z136 Encounter for screening for cardiovascular disorders: Secondary | ICD-10-CM | POA: Insufficient documentation

## 2021-11-21 DIAGNOSIS — R471 Dysarthria and anarthria: Secondary | ICD-10-CM | POA: Insufficient documentation

## 2021-11-21 DIAGNOSIS — K219 Gastro-esophageal reflux disease without esophagitis: Secondary | ICD-10-CM | POA: Diagnosis present

## 2021-11-21 DIAGNOSIS — I6521 Occlusion and stenosis of right carotid artery: Secondary | ICD-10-CM | POA: Diagnosis not present

## 2021-11-21 DIAGNOSIS — K760 Fatty (change of) liver, not elsewhere classified: Secondary | ICD-10-CM | POA: Diagnosis present

## 2021-11-21 DIAGNOSIS — R519 Headache, unspecified: Secondary | ICD-10-CM | POA: Insufficient documentation

## 2021-11-21 DIAGNOSIS — I700 Atherosclerosis of aorta: Secondary | ICD-10-CM | POA: Diagnosis present

## 2021-11-21 DIAGNOSIS — R109 Unspecified abdominal pain: Secondary | ICD-10-CM | POA: Diagnosis not present

## 2021-11-21 DIAGNOSIS — R079 Chest pain, unspecified: Secondary | ICD-10-CM | POA: Diagnosis not present

## 2021-11-21 LAB — COMPREHENSIVE METABOLIC PANEL
ALT: 13 U/L (ref 0–44)
AST: 20 U/L (ref 15–41)
Albumin: 4.3 g/dL (ref 3.5–5.0)
Alkaline Phosphatase: 85 U/L (ref 38–126)
Anion gap: 7 (ref 5–15)
BUN: 14 mg/dL (ref 8–23)
CO2: 28 mmol/L (ref 22–32)
Calcium: 10 mg/dL (ref 8.9–10.3)
Chloride: 102 mmol/L (ref 98–111)
Creatinine, Ser: 0.77 mg/dL (ref 0.44–1.00)
GFR, Estimated: >60 mL/min (ref >60)
Glucose, Bld: 264 mg/dL — ABNORMAL HIGH (ref 70–99)
Potassium: 4.9 mmol/L (ref 3.5–5.1)
Sodium: 137 mmol/L (ref 135–145)
Total Bilirubin: 1 mg/dL (ref 0.3–1.2)
Total Protein: 7.3 g/dL (ref 6.5–8.1)

## 2021-11-21 LAB — RAPID URINE DRUG SCREEN, HOSP PERFORMED
Amphetamines: NOT DETECTED
Barbiturates: NOT DETECTED
Benzodiazepines: POSITIVE — AB
Cocaine: NOT DETECTED
Opiates: NOT DETECTED
Tetrahydrocannabinol: NOT DETECTED

## 2021-11-21 LAB — I-STAT CHEM 8, ED
BUN: 17 mg/dL (ref 8–23)
Calcium, Ion: 1.24 mmol/L (ref 1.15–1.40)
Chloride: 103 mmol/L (ref 98–111)
Creatinine, Ser: 0.7 mg/dL (ref 0.44–1.00)
Glucose, Bld: 236 mg/dL — ABNORMAL HIGH (ref 70–99)
HCT: 48 % — ABNORMAL HIGH (ref 36.0–46.0)
Hemoglobin: 16.3 g/dL — ABNORMAL HIGH (ref 12.0–15.0)
Potassium: 5.1 mmol/L (ref 3.5–5.1)
Sodium: 138 mmol/L (ref 135–145)
TCO2: 28 mmol/L (ref 22–32)

## 2021-11-21 LAB — BLOOD GAS, VENOUS
Acid-Base Excess: 7.5 mmol/L — ABNORMAL HIGH (ref 0.0–2.0)
Bicarbonate: 35.3 mmol/L — ABNORMAL HIGH (ref 20.0–28.0)
Drawn by: 61882
O2 Saturation: 66.7 %
Patient temperature: 36.5
pCO2, Ven: 60 mmHg (ref 44–60)
pH, Ven: 7.38 (ref 7.25–7.43)
pO2, Ven: 37 mmHg (ref 32–45)

## 2021-11-21 LAB — DIFFERENTIAL
Abs Immature Granulocytes: 0.05 10*3/uL (ref 0.00–0.07)
Basophils Absolute: 0.1 10*3/uL (ref 0.0–0.1)
Basophils Relative: 2 %
Eosinophils Absolute: 0.1 10*3/uL (ref 0.0–0.5)
Eosinophils Relative: 2 %
Immature Granulocytes: 1 %
Lymphocytes Relative: 33 %
Lymphs Abs: 2.5 10*3/uL (ref 0.7–4.0)
Monocytes Absolute: 0.5 10*3/uL (ref 0.1–1.0)
Monocytes Relative: 7 %
Neutro Abs: 4.3 10*3/uL (ref 1.7–7.7)
Neutrophils Relative %: 55 %

## 2021-11-21 LAB — CBC
HCT: 48.5 % — ABNORMAL HIGH (ref 36.0–46.0)
Hemoglobin: 16.2 g/dL — ABNORMAL HIGH (ref 12.0–15.0)
MCH: 32.1 pg (ref 26.0–34.0)
MCHC: 33.4 g/dL (ref 30.0–36.0)
MCV: 96.2 fL (ref 80.0–100.0)
Platelets: 179 10*3/uL (ref 150–400)
RBC: 5.04 MIL/uL (ref 3.87–5.11)
RDW: 13.4 % (ref 11.5–15.5)
WBC: 7.6 10*3/uL (ref 4.0–10.5)
nRBC: 0 % (ref 0.0–0.2)

## 2021-11-21 LAB — APTT: aPTT: 27 seconds (ref 24–36)

## 2021-11-21 LAB — URINALYSIS, ROUTINE W REFLEX MICROSCOPIC
Bacteria, UA: NONE SEEN
Bilirubin Urine: NEGATIVE
Glucose, UA: >=500 mg/dL — AB
Hgb urine dipstick: NEGATIVE
Ketones, ur: NEGATIVE mg/dL
Leukocytes,Ua: NEGATIVE
Nitrite: NEGATIVE
Protein, ur: NEGATIVE mg/dL
Specific Gravity, Urine: 1.009 (ref 1.005–1.030)
pH: 6 (ref 5.0–8.0)

## 2021-11-21 LAB — ETHANOL: Alcohol, Ethyl (B): <10 mg/dL (ref <10)

## 2021-11-21 LAB — RESP PANEL BY RT-PCR (FLU A&B, COVID) ARPGX2
Influenza A by PCR: NEGATIVE
Influenza B by PCR: NEGATIVE
SARS Coronavirus 2 by RT PCR: NEGATIVE

## 2021-11-21 LAB — PROTIME-INR
INR: 1 (ref 0.8–1.2)
Prothrombin Time: 13 seconds (ref 11.4–15.2)

## 2021-11-21 LAB — GLUCOSE, CAPILLARY
Glucose-Capillary: 228 mg/dL — ABNORMAL HIGH (ref 70–99)
Glucose-Capillary: 193 mg/dL — ABNORMAL HIGH (ref 70–99)

## 2021-11-21 LAB — CBG MONITORING, ED: Glucose-Capillary: 177 mg/dL — ABNORMAL HIGH (ref 70–99)

## 2021-11-21 LAB — AMMONIA: Ammonia: <10 umol/L (ref 9–35)

## 2021-11-21 MED ORDER — ACETAMINOPHEN 325 MG PO TABS
650.0000 mg | ORAL_TABLET | Freq: Four times a day (QID) | ORAL | Status: DC | PRN
  Administered 2021-11-22: 650 mg via ORAL
  Filled 2021-11-21: qty 2

## 2021-11-21 MED ORDER — PROMETHAZINE HCL 12.5 MG PO TABS
12.5000 mg | ORAL_TABLET | Freq: Three times a day (TID) | ORAL | Status: DC | PRN
Start: 2021-11-21 — End: 2021-11-23

## 2021-11-21 MED ORDER — GADOBUTROL 1 MMOL/ML IV SOLN
8.0000 mL | Freq: Once | INTRAVENOUS | Status: AC | PRN
  Administered 2021-11-21: 8 mL via INTRAVENOUS

## 2021-11-21 MED ORDER — VITAMIN B-12 1000 MCG PO TABS
1000.0000 ug | ORAL_TABLET | Freq: Every day | ORAL | Status: DC
  Administered 2021-11-22 – 2021-11-23 (×2): 1000 ug via ORAL
  Filled 2021-11-21 (×2): qty 1

## 2021-11-21 MED ORDER — ONDANSETRON HCL 4 MG PO TABS: 4.0000 mg | ORAL_TABLET | Freq: Four times a day (QID) | ORAL | Status: DC | PRN

## 2021-11-21 MED ORDER — ONDANSETRON HCL 4 MG/2ML IJ SOLN: 4.0000 mg | Freq: Four times a day (QID) | INTRAMUSCULAR | Status: DC | PRN

## 2021-11-21 MED ORDER — LINAGLIPTIN 5 MG PO TABS
5.0000 mg | ORAL_TABLET | Freq: Every day | ORAL | Status: DC
  Administered 2021-11-22 – 2021-11-23 (×2): 5 mg via ORAL
  Filled 2021-11-21 (×2): qty 1

## 2021-11-21 MED ORDER — FLUTICASONE PROPIONATE 50 MCG/ACT NA SUSP
2.0000 | Freq: Every day | NASAL | Status: DC
Start: 2021-11-21 — End: 2021-11-23
  Administered 2021-11-21 – 2021-11-23 (×3): 2 via NASAL
  Filled 2021-11-21: qty 16

## 2021-11-21 MED ORDER — DULOXETINE HCL 30 MG PO CPEP
60.0000 mg | ORAL_CAPSULE | Freq: Every day | ORAL | Status: DC
  Administered 2021-11-22 – 2021-11-23 (×2): 60 mg via ORAL
  Filled 2021-11-21 (×2): qty 2

## 2021-11-21 MED ORDER — INSULIN ASPART 100 UNIT/ML IJ SOLN
0.0000 [IU] | Freq: Every day | INTRAMUSCULAR | Status: DC
  Administered 2021-11-22: 3 [IU] via SUBCUTANEOUS

## 2021-11-21 MED ORDER — NALOXONE HCL 0.4 MG/ML IJ SOLN
0.4000 mg | INTRAMUSCULAR | Status: DC | PRN
  Administered 2021-11-21: 0.4 mg via INTRAVENOUS
  Filled 2021-11-21: qty 1

## 2021-11-21 MED ORDER — ENOXAPARIN SODIUM 40 MG/0.4ML IJ SOSY
40.0000 mg | PREFILLED_SYRINGE | INTRAMUSCULAR | Status: DC
  Administered 2021-11-21 – 2021-11-22 (×2): 40 mg via SUBCUTANEOUS
  Filled 2021-11-21 (×2): qty 0.4

## 2021-11-21 MED ORDER — ACETAMINOPHEN 650 MG RE SUPP: 650.0000 mg | Freq: Four times a day (QID) | RECTAL | Status: DC | PRN

## 2021-11-21 MED ORDER — ATORVASTATIN CALCIUM 40 MG PO TABS
40.0000 mg | ORAL_TABLET | Freq: Every day | ORAL | Status: DC
  Administered 2021-11-21 – 2021-11-23 (×3): 40 mg via ORAL
  Filled 2021-11-21 (×3): qty 1

## 2021-11-21 MED ORDER — MONTELUKAST SODIUM 10 MG PO TABS
10.0000 mg | ORAL_TABLET | Freq: Every day | ORAL | Status: DC
  Administered 2021-11-21 – 2021-11-22 (×2): 10 mg via ORAL
  Filled 2021-11-21 (×2): qty 1

## 2021-11-21 MED ORDER — MECLIZINE HCL 12.5 MG PO TABS: 50.0000 mg | ORAL_TABLET | Freq: Every day | ORAL | Status: DC | PRN

## 2021-11-21 MED ORDER — OXYBUTYNIN CHLORIDE ER 5 MG PO TB24
15.0000 mg | ORAL_TABLET | Freq: Every day | ORAL | Status: DC
  Administered 2021-11-21 – 2021-11-22 (×2): 15 mg via ORAL
  Filled 2021-11-21 (×2): qty 3

## 2021-11-21 MED ORDER — PREGABALIN 75 MG PO CAPS
150.0000 mg | ORAL_CAPSULE | Freq: Three times a day (TID) | ORAL | Status: DC
  Administered 2021-11-21 – 2021-11-23 (×6): 150 mg via ORAL
  Filled 2021-11-21 (×6): qty 2

## 2021-11-21 MED ORDER — OXYCODONE HCL 5 MG PO TABS
15.0000 mg | ORAL_TABLET | Freq: Four times a day (QID) | ORAL | Status: DC | PRN
  Administered 2021-11-21 – 2021-11-23 (×6): 15 mg via ORAL
  Filled 2021-11-21 (×6): qty 3

## 2021-11-21 MED ORDER — LISINOPRIL 5 MG PO TABS
5.0000 mg | ORAL_TABLET | Freq: Every day | ORAL | Status: DC
  Administered 2021-11-21 – 2021-11-23 (×3): 5 mg via ORAL
  Filled 2021-11-21 (×3): qty 1

## 2021-11-21 MED ORDER — IOHEXOL 350 MG/ML SOLN
80.0000 mL | Freq: Once | INTRAVENOUS | Status: AC | PRN
  Administered 2021-11-21: 80 mL via INTRAVENOUS

## 2021-11-21 MED ORDER — LAMOTRIGINE 100 MG PO TABS
200.0000 mg | ORAL_TABLET | Freq: Every day | ORAL | Status: DC
  Administered 2021-11-21 – 2021-11-22 (×2): 200 mg via ORAL
  Filled 2021-11-21 (×2): qty 2

## 2021-11-21 MED ORDER — INSULIN ASPART 100 UNIT/ML IJ SOLN
0.0000 [IU] | Freq: Three times a day (TID) | INTRAMUSCULAR | Status: DC
  Administered 2021-11-21 – 2021-11-22 (×2): 3 [IU] via SUBCUTANEOUS
  Administered 2021-11-22: 5 [IU] via SUBCUTANEOUS
  Administered 2021-11-22: 3 [IU] via SUBCUTANEOUS
  Administered 2021-11-23 (×2): 5 [IU] via SUBCUTANEOUS

## 2021-11-21 NOTE — ED Notes (Signed)
Not a TNK candidate per Tele-Neuro MD. Pt going to MRI from CT at this time.

## 2021-11-21 NOTE — ED Triage Notes (Signed)
Pt brought in emergency traffic by [facility] from home with c/o slurred speech, left facial droop and left sided neglect. EMS pre activated a CODE STROKE PTA. [id number].

## 2021-11-21 NOTE — ED Notes (Signed)
Patient transported to CT 

## 2021-11-21 NOTE — ED Notes (Signed)
Necklace and bra given to pt's daughter at bedside

## 2021-11-21 NOTE — ED Notes (Signed)
Pt given sandwich tray for lunch

## 2021-11-21 NOTE — ED Notes (Signed)
Code Stroke called by EMS @ [phone] beeped and called CT @ 0836.

## 2021-11-21 NOTE — H&P (Signed)
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 provide the required intensity of service to ensure the patient's safety. The patient's presenting symptoms, physical exam findings, and initial radiographic and laboratory data in the context of their medical condition is felt to place them at decreased risk for further clinical deterioration. Furthermore, it is anticipated that the patient will be medically stable for discharge from the hospital within 2 midnights of admission.    Harneet Noblett D Manuella Ghazi DO [facility]  If 7PM-7AM, please contact night-coverage [email or web address]  11/21/2021, 1:28 PM

## 2021-11-21 NOTE — Consult Note (Addendum)
Addendum per coding query: Patient presented with fluctuating weakness that was intermittently focal and concerning for possible acute stroke. MRI brain was performed to rule this out and was negative. MRA H&N was performed to rule out critical stenosis in her cerebrovasculature leading to symptoms that were fluctuating and was also negative.  Su Monks, MD [facility] [phone]  If 7pm- 7am, please page neurology on call as listed in [place].   NEUROLOGY TELECONSULTATION NOTE   Date of service: November 21, 2021 Patient Name: Cynthia Deleon MRN:  [id number] DOB:  1955/06/12 Reason for consult: telestroke  Requesting Provider: Dr. Teressa Lower Consult Participants: myself, patient, bedside RN, telestroke RN, Dr. Matilde Sprang Location of the provider: [facility] Location of the patient: [facility]  This consult was provided via telemedicine with 2-way video and audio communication. The patient/family was informed that care would be provided in this way and agreed to receive care in this manner.   _ _ _   _ __   _ __ _ _  __ __   _ __   __ _  History of Present Illness   This is a 66 year old woman with a past medical history significant for bipolar 1 disorder and chart diagnoses of multiple personality disorder, COPD, diabetes, hypertension, headache who is brought in by EMS for encephalopathy.  EMS was concerned she might have a right gaze deviation and dysarthria is such that her speech was intelligible but she had neither of these upon tele examination.  Last known well was 1:30 AM and she was therefore not a TNK candidate due to presentation outside the window.  CT head showed no acute intracranial process on personal review. She was fully oriented and able to name 5/6 objects. She was not able to lift any extremity when asked to do so but when asked to do FNF bilaterally she was able to do so.  She was taken to MRI from CT for STAT stroke code MRI. MRI was negative for acute stroke  (personal review). MRA H&N showed no LVO.   She is prescribed multiple potentially deliriogenic medications as an outpatient including diazepam, oxycodone, and pregabalin.    No known hx seizures. She is not on anticoagulation.   ROS   UTA 2/2 encephalopathy  Past History   The following was personally reviewed:  Past Medical History:  Diagnosis Date   Aortic atherosclerosis ([place]) 10/31/2021   Arthritis    hands and knees   Bipolar 1 disorder (HCC)    Borderline diabetic    Chronic back pain    Chronic neck pain    Complication of anesthesia    high anxiety-does not want to be alone   COPD (chronic obstructive pulmonary disease) ([place])    Current use of estrogen therapy 01/12/2014   Depression    Diabetes mellitus without complication (HCC)    Diplopia    Family history of adverse reaction to anesthesia    sister "gas in lungs"   GERD (gastroesophageal reflux disease)    Headache    Hepatic steatosis 10/31/2021   Hot flashes 12/15/2013   Hypertension    IBS (irritable bowel syndrome)    Incontinence    Kidney stone    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HCC)    Panic attacks    Peptic ulcer    Peripheral neuropathy    Rosacea    Shingles    Sleep apnea    uses a cpap-with oxygen   Past Surgical History:  Procedure Laterality Date  BIOPSY  04/27/2020   Procedure: BIOPSY;  Surgeon: Ronald Lobo, MD;  Location: [facility];  Service: Endoscopy;;   BUNIONECTOMY WITH HAMMERTOE RECONSTRUCTION Right 12/10/2012   Procedure: RIGHT FIRST METATARSAL CHEVRON BUNION CORRECTION,  2 AND 3 HAMMERTOE CORRECTION , RIGHT 3 AND 4 TOE NAIL EXCISION ;  Surgeon: Wylene Simmer, MD;  Location: [place];  Service: Orthopedics;  Laterality: Right;   COLONOSCOPY WITH PROPOFOL N/A 04/27/2020   Procedure: COLONOSCOPY WITH PROPOFOL;  Surgeon: Ronald Lobo, MD;  Location: [facility];  Service: Endoscopy;  Laterality: N/A;   FOOT ARTHRODESIS  2000   both feet   LIPOSUCTION   03/2018   RECTAL SURGERY     TONSILLECTOMY     TOTAL ABDOMINAL HYSTERECTOMY     Family History  Problem Relation Age of Onset   Diabetes Mother    Other Mother        vertigo; chronic eye disease   COPD Father    COPD Sister    Alcohol abuse Brother    Diabetes Maternal Grandmother    Diabetes Maternal Grandfather    Diabetes Sister    Other Sister        hearing problems   Hyperlipidemia Sister    Alcohol abuse Brother    COPD Brother    Alcohol abuse Brother    Alcohol abuse Brother    Other Brother        aneursym   Social History   Socioeconomic History   Marital status: Married    Spouse name: Not on file   Number of children: 5   Years of education: 9th   Highest education level: Not on file  Occupational History   Occupation: Disabled  Tobacco Use   Smoking status: Former    Packs/day: 1.00    Years: 42.00    Total pack years: 42.00    Types: Cigarettes    Quit date: 06/10/2017    Years since quitting: 4.4   Smokeless tobacco: Never  Vaping Use   Vaping Use: Never used  Substance and Sexual Activity   Alcohol use: No    Alcohol/week: 0.0 standard drinks of alcohol   Drug use: No   Sexual activity: Never    Birth control/protection: Surgical    Comment: occ cigarette  Other Topics Concern   Not on file  Social History Narrative   Lives at home with home with her son (has five children).   Right-handed.   1 cup caffeine per day.   Social Determinants of Health   Financial Resource Strain: Not on file  Food Insecurity: No Food Insecurity (01/07/2020)   Hunger Vital Sign    Worried About Running Out of Food in the Last Year: Never true    Ran Out of Food in the Last Year: Never true  Transportation Needs: No Transportation Needs (01/07/2020)   PRAPARE - Hydrologist (Medical): No    Lack of Transportation (Non-Medical): No  Physical Activity: Not on file  Stress: Not on file  Social Connections: Not on file    Allergies  Allergen Reactions   Chantix [Varenicline] Other (See Comments)    Altered mental status-Per patient was put on allergy list by Dr Lorriane Shire bu patient staes she is not allergic to this and is currently taking it.    Divalproex Sodium Other (See Comments)    Hallucinations  Other reaction(s): Confusion   Other Anaphylaxis   Pseudoeph-Hydrocodone-Gg Nausea Only   Sulfa Antibiotics  Anaphylaxis, Nausea Only, Swelling and Other (See Comments)    Swelling of tongue.   Varenicline Tartrate Other (See Comments)    Other reaction(s): Confusion   Ambien [Zolpidem] Other (See Comments)    Causes sleep walking   Clavulanic Acid Diarrhea   Codeine Nausea And Vomiting and Other (See Comments)   Elemental Sulfur Other (See Comments)    Swelling of tongue   Hydrocod Poli-Chlorphe Poli Er Other (See Comments)    Other reaction(s): Skin itches    Hydrocodone Nausea And Vomiting   Macrobid [Nitrofurantoin] Nausea And Vomiting    Dizziness, nausea, vomiting   Paxil [Paroxetine Hcl] Other (See Comments)    Causes ringing in the ears.    Prednisone Other (See Comments)    Other reaction(s): Unknown    Amoxicillin Rash    Has patient had a PCN reaction causing immediate rash, facial/tongue/throat swelling, SOB or lightheadedness with hypotension: No Has patient had a PCN reaction causing severe rash involving mucus membranes or skin necrosis: No Has patient had a PCN reaction that required hospitalization: No Has patient had a PCN reaction occurring within the last 10 years: Yes If all of the above answers are "NO", then may proceed with Cephalosporin use.    Farxiga [Dapagliflozin]     RECURRENT YEAST/UTI   Metformin And Related Diarrhea   Tape Rash    Medications   (Not in a hospital admission)     Current Facility-Administered Medications:    naloxone (NARCAN) injection 0.4 mg, 0.4 mg, Intravenous, PRN, Kommor, [place], MD  Current Outpatient Medications:    albuterol  (VENTOLIN HFA) 108 (90 Base) MCG/ACT inhaler, Inhale 2 puffs into the lungs every 6 (six) hours as needed., Disp: 18 g, Rfl: 2   atorvastatin (LIPITOR) 40 MG tablet, TAKE ONE TABLET BY MOUTH ONCE DAILY., Disp: 30 tablet, Rfl: 0   azelastine (ASTELIN) 0.1 % nasal spray, Place 1 spray into both nostrils 2 (two) times daily. Use in each nostril as directed, Disp: 30 mL, Rfl: 12   cholecalciferol (VITAMIN D) 25 MCG tablet, Take 1 tablet (1,000 Units total) by mouth daily., Disp: , Rfl:    diazepam (VALIUM) 5 MG tablet, Take 5 mg by mouth in the morning and at bedtime. , Disp: , Rfl:    Dulaglutide (TRULICITY) 1.61 WR/6.0AV SOPN, Inject 0.75 mg into the skin once a week. DX: E11.65, Disp: 2 mL, Rfl: 0   DULoxetine (CYMBALTA) 60 MG capsule, Take 1 capsule (60 mg total) by mouth daily., Disp: 30 capsule, Rfl: 11   fluticasone (FLONASE) 50 MCG/ACT nasal spray, USE 2 SPRAYS IN EACH NOSTRIL ONCE DAILY., Disp: 16 g, Rfl: 0   glipiZIDE (GLUCOTROL XL) 5 MG 24 hr tablet, Take 1 tablet (5 mg total) by mouth 2 (two) times daily with a meal., Disp: 60 tablet, Rfl: 5   glucose blood (CONTOUR NEXT TEST) test strip, Test BS 4 times daily Dx E11.9, Disp: 400 strip, Rfl: 3   JANUVIA 100 MG tablet, TAKE (1) TABLET BY MOUTH ONCE DAILY., Disp: 30 tablet, Rfl: 0   lamoTRIgine (LAMICTAL) 200 MG tablet, Take 200 mg by mouth at bedtime. For mood disorder., Disp: , Rfl:    lansoprazole (PREVACID) 30 MG capsule, TAKE (1) CAPSULE BY MOUTH ONCE DAILY., Disp: 30 capsule, Rfl: 0   lisinopril (ZESTRIL) 5 MG tablet, TAKE (1) TABLET BY MOUTH ONCE DAILY., Disp: 30 tablet, Rfl: 0   meclizine (ANTIVERT) 25 MG tablet, Take 2 tablets (50 mg total) by mouth daily as  needed for dizziness., Disp: 60 tablet, Rfl: 2   montelukast (SINGULAIR) 10 MG tablet, TAKE (1) TABLET BY MOUTH AT BEDTIME., Disp: 90 tablet, Rfl: 1   OneTouch Delica Lancets 83T MISC, Use to test blood sugar twice daily. DX: E11.9, Disp: 100 each, Rfl: 3   oxybutynin (DITROPAN  XL) 15 MG 24 hr tablet, Take 1 tablet (15 mg total) by mouth at bedtime., Disp: 30 tablet, Rfl: 0   oxyCODONE (ROXICODONE) 15 MG immediate release tablet, Take 15 mg by mouth every 6 (six) hours as needed., Disp: , Rfl:    pregabalin (LYRICA) 150 MG capsule, Take 1 capsule (150 mg total) by mouth 3 (three) times daily., Disp: 90 capsule, Rfl: 2   PREMARIN 0.3 MG tablet, TAKE (1) TABLET BY MOUTH ONCE DAILY., Disp: 30 tablet, Rfl: 5   promethazine (PHENERGAN) 12.5 MG tablet, Take 1 tablet (12.5 mg total) by mouth every 8 (eight) hours as needed for nausea or vomiting., Disp: 20 tablet, Rfl: 0   valACYclovir (VALTREX) 500 MG tablet, TAKE (1) TABLET BY MOUTH ONCE DAILY AS NEEDED FOR SHINGLES FLARE UP., Disp: 30 tablet, Rfl: 0   vitamin B-12 1000 MCG tablet, Take 1 tablet (1,000 mcg total) by mouth daily., Disp: , Rfl:   Vitals   There were no vitals filed for this visit.   There is no height or weight on file to calculate BMI.  Physical Exam   Exam performed over telemedicine with 2-way video and audio communication and with assistance of bedside RN  Physical Exam Gen: A&O x4, NAD Resp: normal WOB CV: extremities appear well-perfused  Neuro: *MS: A&O x4. Follows multi-step commands.  *Speech: mild dysarthria, able to name 5 of 6 objects *CN: PERRL 92m, EOMI, blinks to threat bilat, sensation intact, smile symmetric, hearing intact to voice *Motor:   Normal bulk.  No tremor, rigidity or bradykinesia. She reported that she was unable to lift any extremity when asked to do so, but [facility]. *Sensory: SILT. Symmetric. No double-simultaneous extinction.  *Coordination:  FNF intact bilat *Reflexes:  UTA 2/2 tele-exam *Gait: deferred  NIHSS  1a Level of Conscious.: 0 1b LOC Questions: 0 1c LOC Commands: 0 2 Best Gaze: 0 3 Visual: 0 4 Facial Palsy: 0 5a Motor Arm - left: 3 5b Motor Arm - Right: 3 6a Motor Leg - Left: 3 6b Motor Leg - Right: 3 7  Limb Ataxia: 0 8 Sensory: 0 9 Best Language: 1 10 Dysarthria: 1 11 Extinct. and Inatten.: 0  TOTAL: 14   Premorbid mRS = 2   Labs   CBC:  Recent Labs  Lab 11/21/21 0911  WBC 7.6  NEUTROABS 4.3  HGB 16.2*  HCT 48.5*  MCV 96.2  PLT 1517   Basic Metabolic Panel:  Lab Results  Component Value Date   NA 137 11/21/2021   K 4.9 11/21/2021   CO2 28 11/21/2021   GLUCOSE 264 (H) 11/21/2021   BUN 14 11/21/2021   CREATININE 0.77 11/21/2021   CALCIUM 10.0 11/21/2021   GFRNONAA >60 11/21/2021   GFRAA 102 06/08/2020   Lipid Panel:  Lab Results  Component Value Date   LDLCALC 96 03/29/2021   HgbA1c:  Lab Results  Component Value Date   HGBA1C 7.3 (H) 10/26/2021   Urine Drug Screen:     Component Value Date/Time   LABOPIA NONE DETECTED 11/12/2015 2105   COCAINSCRNUR NONE DETECTED 11/12/2015 2105   LABBENZ POSITIVE (A)  11/12/2015 2105   AMPHETMU NONE DETECTED 11/12/2015 2105   THCU NONE DETECTED 11/12/2015 2105   LABBARB NONE DETECTED 11/12/2015 2105    Alcohol Level     Component Value Date/Time   ETH <10 11/21/2021 0911     Impression   This is a 66 year old woman with a past medical history significant for bipolar 1 disorder and chart diagnoses of multiple personality disorder, COPD, diabetes, hypertension, headache who is brought in by EMS for encephalopathy.  NIHSS = 14 but nonfocal, and somewhat functional (when asked she was unable to lift any extremity but was then able to do FNF bilat). TNK was not administered 2/2 presentation outside the window. MRI brain wo contrast showed no e/o acute infarct. 2/2 fluctuating sx MRA H&N was performed to r/o threatening / near occlusion in the extracranial or intracranial cerebrovasculature but no hemodynamically-significant stenosis was identified.  Recommendations    - workup for metabolic and infectious etiologies of encephalopathy per ED - no indication for additional antiplatelet - no indication for further  stroke or other neurologic workup, OK to cancel stroke code ______________________________________________________________________   Thank you for the opportunity to take part in the care of this patient. If you have any further questions, please contact the neurology consultation attending.  Signed,  Su Monks, MD [facility] [phone]  If 7pm- 7am, please page neurology on call as listed in [place].

## 2021-11-21 NOTE — ED Provider Notes (Signed)
[facility] Provider Note  CSN: [id number] Arrival date & time: 11/21/21 0854  Chief Complaint(s) Code Stroke  HPI Cynthia Deleon is a 66 y.o. female with PMH bipolar 1 disorder, COPD, T2DM, chronic back pain on chronic pain medication, recent hospitalization for frequent falls and generalized weakness who presents emergency department for evaluation of slurred speech, weakness and potential gaze deviation.  Patient arrives as a stroke alert with last known well 1:30 AM on 11/21/2021.  Patient arrives with generalized weakness of upper and lower extremities, no apparent true gaze palsy but does have nystagmus on exam, slow to answer questions.  Additional history unable to be obtained due to patient's current mental status.  Of note, patient with no home oxygen use but arrives on 3 L nasal cannula due to hypoxia in the field with EMS.   Past Medical History Past Medical History:  Diagnosis Date   Aortic atherosclerosis ([place]) 10/31/2021   Arthritis    hands and knees   Bipolar 1 disorder (HCC)    Borderline diabetic    Chronic back pain    Chronic neck pain    Complication of anesthesia    high anxiety-does not want to be alone   COPD (chronic obstructive pulmonary disease) (HCC)    Current use of estrogen therapy 01/12/2014   Depression    Diabetes mellitus without complication ([place])    Diplopia    Family history of adverse reaction to anesthesia    sister "gas in lungs"   GERD (gastroesophageal reflux disease)    Headache    Hepatic steatosis 10/31/2021   Hot flashes 12/15/2013   Hypertension    IBS (irritable bowel syndrome)    Incontinence    Kidney stone    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[place])    Panic attacks    Peptic ulcer    Peripheral neuropathy    Rosacea    Shingles    Sleep apnea    uses a cpap-with oxygen   Patient Active Problem List   Diagnosis Date Noted   Osteopenia 10/31/2021   Aortic atherosclerosis ([place]) 10/31/2021   Hepatic  steatosis 10/31/2021   Generalized weakness 10/28/2021   Ambulatory dysfunction 10/28/2021   Falls frequently 10/26/2021   Constipation 10/26/2021   Bipolar 1 disorder ([place])    Neck pain 05/21/2021   Gait abnormality 05/21/2021   Right leg swelling 05/21/2021   Polyneuropathy due to type 2 diabetes mellitus ([place]) 10/10/2020   Seasonal allergies 10/10/2020   Controlled substance agreement signed 10/10/2020   Stress incontinence of urine 06/20/2020   Diplopia 03/20/2020   GERD (gastroesophageal reflux disease)    Essential hypertension    Incontinence    COPD (chronic obstructive pulmonary disease) ([place])    Osteoarthritis of left knee 04/01/2019   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   Type 2 diabetes mellitus with hypoglycemia without coma, without long-term current use of insulin ([place]) 07/06/2018   Bilateral temporomandibular joint pain 01/21/2018   OSA (obstructive sleep apnea) 01/21/2018   Vertigo 01/21/2018   DDD (degenerative disc disease), cervical 07/17/2017   Degeneration of lumbar intervertebral disc 07/17/2017   OCD (obsessive compulsive disorder) 11/15/2015   Tobacco use disorder 11/15/2015   Current use of estrogen therapy 01/12/2014   PTSD (post-traumatic stress disorder) 08/03/2011   Bipolar I disorder, most recent episode mixed, severe with psychotic features ([place])    Home Medication(s) Prior to Admission medications   Medication Sig Start Date End Date Taking? Authorizing Provider  albuterol (VENTOLIN HFA) 108 (90 Base)  MCG/ACT inhaler Inhale 2 puffs into the lungs every 6 (six) hours as needed. 07/20/21   Loman Brooklyn, FNP  atorvastatin (LIPITOR) 40 MG tablet TAKE ONE TABLET BY MOUTH ONCE DAILY. 10/31/21   Loman Brooklyn, FNP  azelastine (ASTELIN) 0.1 % nasal spray Place 1 spray into both nostrils 2 (two) times daily. Use in each nostril as directed 02/20/21   Chesley Mires, MD  cholecalciferol (VITAMIN D) 25 MCG tablet Take 1 tablet (1,000 Units total) by mouth daily.  11/01/21   Samuella Cota, MD  diazepam (VALIUM) 5 MG tablet Take 5 mg by mouth in the morning and at bedtime.     [provider]  Dulaglutide (TRULICITY) 6.22 [facility]/7.9GX SOPN Inject 0.75 mg into the skin once a week. DX: E11.65 11/20/21   Loman Brooklyn, FNP  DULoxetine (CYMBALTA) 60 MG capsule Take 1 capsule (60 mg total) by mouth daily. 05/21/21   Marcial Pacas, MD  fluticasone (FLONASE) 50 MCG/ACT nasal spray USE 2 SPRAYS IN EACH NOSTRIL ONCE DAILY. 10/11/21   Loman Brooklyn, FNP  glipiZIDE (GLUCOTROL XL) 5 MG 24 hr tablet Take 1 tablet (5 mg total) by mouth 2 (two) times daily with a meal. 07/13/21   Loman Brooklyn, FNP  glucose blood (CONTOUR NEXT TEST) test strip Test BS 4 times daily Dx E11.9 12/14/19   Loman Brooklyn, FNP  JANUVIA 100 MG tablet TAKE (1) TABLET BY MOUTH ONCE DAILY. 10/03/21   Loman Brooklyn, FNP  lamoTRIgine (LAMICTAL) 200 MG tablet Take 200 mg by mouth at bedtime. For mood disorder.    [provider]  lansoprazole (PREVACID) 30 MG capsule TAKE (1) CAPSULE BY MOUTH ONCE DAILY. 10/31/21   Loman Brooklyn, FNP  lisinopril (ZESTRIL) 5 MG tablet TAKE (1) TABLET BY MOUTH ONCE DAILY. 10/31/21   Loman Brooklyn, FNP  meclizine (ANTIVERT) 25 MG tablet Take 2 tablets (50 mg total) by mouth daily as needed for dizziness. 03/08/21   Loman Brooklyn, FNP  montelukast (SINGULAIR) 10 MG tablet TAKE (1) TABLET BY MOUTH AT BEDTIME. 11/12/21   Gwenlyn Perking, FNP  OneTouch Delica Lancets 21J MISC Use to test blood sugar twice daily. DX: E11.9 02/28/20   Loman Brooklyn, FNP  oxybutynin (DITROPAN XL) 15 MG 24 hr tablet Take 1 tablet (15 mg total) by mouth at bedtime. 10/31/21   Loman Brooklyn, FNP  oxyCODONE (ROXICODONE) 15 MG immediate release tablet Take 15 mg by mouth every 6 (six) hours as needed.    [provider]  pregabalin (LYRICA) 150 MG capsule Take 1 capsule (150 mg total) by mouth 3 (three) times daily. 09/13/21   Loman Brooklyn, FNP  PREMARIN 0.3  MG tablet TAKE (1) TABLET BY MOUTH ONCE DAILY. 07/31/21   Loman Brooklyn, FNP  promethazine (PHENERGAN) 12.5 MG tablet Take 1 tablet (12.5 mg total) by mouth every 8 (eight) hours as needed for nausea or vomiting. 08/09/21   Loman Brooklyn, FNP  valACYclovir (VALTREX) 500 MG tablet TAKE (1) TABLET BY MOUTH ONCE DAILY AS NEEDED FOR SHINGLES FLARE UP. 07/06/21   Loman Brooklyn, FNP  vitamin B-12 1000 MCG tablet Take 1 tablet (1,000 mcg total) by mouth daily. 11/01/21   Samuella Cota, MD  Past Surgical History Past Surgical History:  Procedure Laterality Date   BIOPSY  04/27/2020   Procedure: BIOPSY;  Surgeon: Ronald Lobo, MD;  Location: [facility];  Service: Endoscopy;;   BUNIONECTOMY WITH HAMMERTOE RECONSTRUCTION Right 12/10/2012   Procedure: RIGHT FIRST METATARSAL CHEVRON BUNION CORRECTION,  2 AND 3 HAMMERTOE CORRECTION , RIGHT 3 AND 4 TOE NAIL EXCISION ;  Surgeon: Wylene Simmer, MD;  Location: [facility];  Service: Orthopedics;  Laterality: Right;   COLONOSCOPY WITH PROPOFOL N/A 04/27/2020   Procedure: COLONOSCOPY WITH PROPOFOL;  Surgeon: Ronald Lobo, MD;  Location: [facility];  Service: Endoscopy;  Laterality: N/A;   FOOT ARTHRODESIS  2000   both feet   LIPOSUCTION  03/2018   RECTAL SURGERY     TONSILLECTOMY     TOTAL ABDOMINAL HYSTERECTOMY     Family History Family History  Problem Relation Age of Onset   Diabetes Mother    Other Mother        vertigo; chronic eye disease   COPD Father    COPD Sister    Alcohol abuse Brother    Diabetes Maternal Grandmother    Diabetes Maternal Grandfather    Diabetes Sister    Other Sister        hearing problems   Hyperlipidemia Sister    Alcohol abuse Brother    COPD Brother    Alcohol abuse Brother    Alcohol abuse Brother    Other Brother        aneursym    Social  History Social History   Tobacco Use   Smoking status: Former    Packs/day: 1.00    Years: 42.00    Total pack years: 42.00    Types: Cigarettes    Quit date: 06/10/2017    Years since quitting: 4.4   Smokeless tobacco: Never  Vaping Use   Vaping Use: Never used  Substance Use Topics   Alcohol use: No    Alcohol/week: 0.0 standard drinks of alcohol   Drug use: No   Allergies Chantix [varenicline], Divalproex sodium, Other, Pseudoeph-hydrocodone-gg, Sulfa antibiotics, Varenicline tartrate, Ambien [zolpidem], Clavulanic acid, Codeine, Elemental sulfur, Hydrocod poli-chlorphe poli er, Hydrocodone, Macrobid [nitrofurantoin], Paxil [paroxetine hcl], Prednisone, Amoxicillin, Farxiga [dapagliflozin], Metformin and related, and Tape  Review of Systems Review of Systems  Unable to perform ROS: Mental status change    Physical Exam Vital Signs  I have reviewed the triage vital signs There were no vitals taken for this visit.  Physical Exam Vitals and nursing note reviewed.  Constitutional:      General: She is in acute distress.     Appearance: She is well-developed.  HENT:     Head: Normocephalic and atraumatic.  Eyes:     Conjunctiva/sclera: Conjunctivae normal.  Cardiovascular:     Rate and Rhythm: Normal rate and regular rhythm.     Heart sounds: No murmur heard. Pulmonary:     Effort: Pulmonary effort is normal. No respiratory distress.     Breath sounds: Normal breath sounds.  Abdominal:     Palpations: Abdomen is soft.     Tenderness: There is no abdominal tenderness.  Musculoskeletal:        General: No swelling.     Cervical back: Neck supple.  Skin:    General: Skin is warm and dry.     Capillary Refill: Capillary refill takes less than 2 seconds.  Neurological:     Mental Status: She is alert.     Motor: Weakness present.  Comments: Horizontal nystagmus  Psychiatric:        Mood and Affect: Mood normal.     ED Results and Treatments Labs (all labs  ordered are listed, but only abnormal results are displayed) Labs Reviewed  RESP PANEL BY RT-PCR (FLU A&B, COVID) ARPGX2  ETHANOL  PROTIME-INR  APTT  CBC  DIFFERENTIAL  COMPREHENSIVE METABOLIC PANEL  RAPID URINE DRUG SCREEN, HOSP PERFORMED  URINALYSIS, ROUTINE W REFLEX MICROSCOPIC  BLOOD GAS, VENOUS  I-STAT CHEM 8, ED                                                                                                                          Radiology CT HEAD CODE STROKE WO CONTRAST  Result Date: 11/21/2021 CLINICAL DATA:  Code stroke. 66 year old female. Altered mental status. EXAM: CT HEAD WITHOUT CONTRAST TECHNIQUE: Contiguous axial images were obtained from the base of the skull through the vertex without intravenous contrast. RADIATION DOSE REDUCTION: This exam was performed according to the departmental dose-optimization program which includes automated exposure control, adjustment of the mA and/or kV according to patient size and/or use of iterative reconstruction technique. COMPARISON:  Brain MRI 03/28/2020.  Head CT 10/27/2021. FINDINGS: Brain: No midline shift, ventriculomegaly, mass effect, evidence of mass lesion, intracranial hemorrhage or evidence of cortically based acute infarction. Gray-white matter differentiation is within normal limits throughout the brain. Vascular: No suspicious intracranial vascular hyperdensity. Skull: Chronic traumatic and/or postoperative nasal bone changes. No acute osseous abnormality identified. Sinuses/Orbits: Clear. Other: Visualized orbits and scalp soft tissues are within normal limits. ASPECTS [facility] Stroke Program Early CT Score) Total score (0-10 with 10 being normal): 10 IMPRESSION: Stable and normal for age noncontrast CT appearance of the brain. Study discussed by telephone with Dr. Teressa Lower on 11/21/2021 at 09:14 . Electronically Signed   By: Genevie Ann M.D.   On: 11/21/2021 09:14    Pertinent labs & imaging results that were available  during my care of the patient were reviewed by me and considered in my medical decision making (see MDM for details).  Medications Ordered in ED Medications  naloxone (NARCAN) injection 0.4 mg (has no administration in time range)                                                                                                                                     Procedures .Critical Care  Performed by: Harsha Yusko,  Debe Coder, MD Authorized by: Teressa Lower, MD   Critical care provider statement:    Critical care time (minutes):  30   Critical care was time spent personally by me on the following activities:  Development of treatment plan with patient or surrogate, discussions with consultants, evaluation of patient's response to treatment, examination of patient, ordering and review of laboratory studies, ordering and review of radiographic studies, ordering and performing treatments and interventions, pulse oximetry, re-evaluation of patient's condition and review of old charts   (including critical care time)  Medical Decision Making / ED Course   This patient presents to the ED for concern of altered mental status, weakness, this involves an extensive number of treatment options, and is a complaint that carries with it a high risk of complications and morbidity.  The differential diagnosis includes CVA, polypharmacy, encephalopathy, UTI, pneumonia, pulmonary embolism  MDM: Patient seen emergency room for evaluation of multiple complaints described above.  Patient arrives as a stroke alert and initial Noncon head CT unremarkable.  Stat MRI/MRA also unremarkable.  Neurology with very low suspicion for acute CVA at this time and is recommending additional encephalopathy work-up and likely medical admission.  Laboratory evaluation largely unremarkable with no obvious explanation for the patient's altered mental status and weakness.  Chest x-ray unremarkable.  Patient has significant tenderness in  the suprapubic region and I suspect acute urinary retention.  Bedside ultrasound revealing greater than 800 cc of urine retained in the bladder.  Foley catheter placed leading to relief of the patient's abdominal pain.  CT PE was obtained due to patient's persistent hypoxia that was reassuringly negative.  Patient received Narcan with no improvement.  Suspect multifactorial cause of the patient's persistent weakness secondary to oversedation with Valium and possible deconditioning.  Patient will require admission for new oxygen requirement and observation, further work-up of her weakness.  Additional history obtained: -Additional history obtained from daughter -External records from outside source obtained and reviewed including: Chart review including previous notes, labs, imaging, consultation notes   Lab Tests: -I ordered, reviewed, and interpreted labs.   The pertinent results include:   Labs Reviewed  RESP PANEL BY RT-PCR (FLU A&B, COVID) ARPGX2  ETHANOL  PROTIME-INR  APTT  CBC  DIFFERENTIAL  COMPREHENSIVE METABOLIC PANEL  RAPID URINE DRUG SCREEN, HOSP PERFORMED  URINALYSIS, ROUTINE W REFLEX MICROSCOPIC  BLOOD GAS, VENOUS  I-STAT CHEM 8, ED       Imaging Studies ordered: I ordered imaging studies including CT head, MR brain and neck, CT PE, chest x-ray I independently visualized and interpreted imaging. I agree with the radiologist interpretation   Medicines ordered and prescription drug management: Meds ordered this encounter  Medications   naloxone (NARCAN) injection 0.4 mg    -I have reviewed the patients home medicines and have made adjustments as needed  Critical interventions Stroke management, Foley catheter placement, oxygen supplementation  Consultations Obtained: I requested consultation with the stroke neurologist,  and discussed lab and imaging findings as well as pertinent plan - they recommend: Medical admission and further work-up   Cardiac  Monitoring: The patient was maintained on a cardiac monitor.  I personally viewed and interpreted the cardiac monitored which showed an underlying rhythm of: NSR  Social Determinants of Health:  Factors impacting patients care include: none   Reevaluation: After the interventions noted above, I reevaluated the patient and found that they have :improved  Co morbidities that complicate the patient evaluation  Past Medical History:  Diagnosis Date  Aortic atherosclerosis ([place]) 10/31/2021   Arthritis    hands and knees   Bipolar 1 disorder (HCC)    Borderline diabetic    Chronic back pain    Chronic neck pain    Complication of anesthesia    high anxiety-does not want to be alone   COPD (chronic obstructive pulmonary disease) ([place])    Current use of estrogen therapy 01/12/2014   Depression    Diabetes mellitus without complication ([place])    Diplopia    Family history of adverse reaction to anesthesia    sister "gas in lungs"   GERD (gastroesophageal reflux disease)    Headache    Hepatic steatosis 10/31/2021   Hot flashes 12/15/2013   Hypertension    IBS (irritable bowel syndrome)    Incontinence    Kidney stone    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HCC)    Panic attacks    Peptic ulcer    Peripheral neuropathy    Rosacea    Shingles    Sleep apnea    uses a cpap-with oxygen      Dispostion: I considered admission for this patient, and given persistent weakness and new oxygen requirement patient will require admission     Final Clinical Impression(s) / ED Diagnoses Final diagnoses:  None     '@PCDICTATION'$ @    Teressa Lower, MD 11/21/21 1402

## 2021-11-21 NOTE — Progress Notes (Addendum)
EMS pre elert: [facility] neurology paged: [phone] EMS arrival: [phone] Tele neurology on screen: 0843 EDP @ bedside: 0854 Pt to CT: [place] by Dr. Quinn Axe started @ 0900 in CT

## 2021-11-22 ENCOUNTER — Encounter: Payer: Medicare Other | Admitting: Family Medicine

## 2021-11-22 DIAGNOSIS — G9341 Metabolic encephalopathy: Secondary | ICD-10-CM | POA: Diagnosis not present

## 2021-11-22 LAB — BASIC METABOLIC PANEL
Anion gap: 6 (ref 5–15)
BUN: 15 mg/dL (ref 8–23)
CO2: 29 mmol/L (ref 22–32)
Calcium: 9 mg/dL (ref 8.9–10.3)
Chloride: 101 mmol/L (ref 98–111)
Creatinine, Ser: 0.83 mg/dL (ref 0.44–1.00)
GFR, Estimated: >60 mL/min (ref >60)
Glucose, Bld: 185 mg/dL — ABNORMAL HIGH (ref 70–99)
Potassium: 4.3 mmol/L (ref 3.5–5.1)
Sodium: 136 mmol/L (ref 135–145)

## 2021-11-22 LAB — CBC
HCT: 45.3 % (ref 36.0–46.0)
Hemoglobin: 14.3 g/dL (ref 12.0–15.0)
MCH: 31.2 pg (ref 26.0–34.0)
MCHC: 31.6 g/dL (ref 30.0–36.0)
MCV: 98.9 fL (ref 80.0–100.0)
Platelets: 190 10*3/uL (ref 150–400)
RBC: 4.58 MIL/uL (ref 3.87–5.11)
RDW: 13.3 % (ref 11.5–15.5)
WBC: 9 10*3/uL (ref 4.0–10.5)
nRBC: 0 % (ref 0.0–0.2)

## 2021-11-22 LAB — GLUCOSE, CAPILLARY
Glucose-Capillary: 227 mg/dL — ABNORMAL HIGH (ref 70–99)
Glucose-Capillary: 191 mg/dL — ABNORMAL HIGH (ref 70–99)
Glucose-Capillary: 291 mg/dL — ABNORMAL HIGH (ref 70–99)

## 2021-11-22 LAB — MAGNESIUM: Magnesium: 2.1 mg/dL (ref 1.7–2.4)

## 2021-11-22 MED ORDER — DIAZEPAM 2 MG PO TABS
4.0000 mg | ORAL_TABLET | Freq: Two times a day (BID) | ORAL | Status: DC
  Administered 2021-11-22 – 2021-11-23 (×3): 4 mg via ORAL
  Filled 2021-11-22 (×3): qty 2

## 2021-11-22 MED ORDER — ALBUTEROL SULFATE (2.5 MG/3ML) 0.083% IN NEBU
2.5000 mg | INHALATION_SOLUTION | RESPIRATORY_TRACT | Status: DC | PRN
Start: 2021-11-22 — End: 2021-11-23
  Administered 2021-11-22 – 2021-11-23 (×2): 2.5 mg via RESPIRATORY_TRACT
  Filled 2021-11-22 (×2): qty 3

## 2021-11-22 MED ORDER — GUAIFENESIN-DM 100-10 MG/5ML PO SYRP
5.0000 mL | ORAL_SOLUTION | ORAL | Status: DC | PRN
  Administered 2021-11-22 – 2021-11-23 (×4): 5 mL via ORAL
  Filled 2021-11-22 (×4): qty 5

## 2021-11-22 MED ORDER — ALBUTEROL SULFATE HFA 108 (90 BASE) MCG/ACT IN AERS: 2.0000 | INHALATION_SPRAY | RESPIRATORY_TRACT | Status: DC | PRN

## 2021-11-22 MED ORDER — CHLORHEXIDINE GLUCONATE CLOTH 2 % EX PADS
6.0000 | MEDICATED_PAD | Freq: Every day | CUTANEOUS | Status: DC
  Administered 2021-11-22: 6 via TOPICAL

## 2021-11-22 NOTE — TOC Initial Note (Signed)
Transition of Care [facility]) - Initial/Assessment Note    Patient Details  Name: Cynthia Deleon MRN: [id number] Date of Birth: 05/22/1956  Transition [facility] Care [facility]) CM/SW Contact:    [name], [place] Phone Number: 11/22/2021, 11:05 AM  Clinical Narrative:                 TOC updated that PT is recommending SNF at D/C. CSW spoke with pt who is agreeable to SNF referral being sent out. Pt [facility] to [facility]. CSW sent referral out. TOC to follow.   Expected Discharge Plan: Skilled Nursing Facility Barriers to Discharge: Continued Medical Work up   Patient Goals and CMS Choice Patient states their goals for this hospitalization and ongoing recovery are:: go to SNF CMS Medicare.gov Compare Post Acute Care list provided to:: Patient Choice offered to / list presented to : Patient  Expected Discharge Plan and Services Expected Discharge Plan: [place] Choice: [place] arrangements for the past 2 months: Single Family Home                                      Prior Living Arrangements/Services Living arrangements for the past 2 months: Single Family Home Lives with:: Self Patient language and need for interpreter reviewed:: Yes Do you feel safe going back to the place where you live?: Yes      Need for Family Participation in Patient Care: Yes (Comment) Care giver support system in place?: Yes (comment)   Criminal Activity/Legal Involvement Pertinent to Current Situation/Hospitalization: No - Comment as needed  Activities of Daily Living Home Assistive Devices/Equipment: Oxygen, CPAP ADL Screening (condition at time of admission) Patient's cognitive ability adequate to safely complete daily activities?: Yes Is the patient deaf or have difficulty hearing?: No Does the patient have difficulty seeing, even when wearing glasses/contacts?: No Does the patient have difficulty  concentrating, remembering, or making decisions?: No Patient able to express need for assistance with ADLs?: Yes Does the patient have difficulty dressing or bathing?: Yes Independently performs ADLs?: No Communication: Independent Dressing (OT): Needs assistance, Independent Does the patient have difficulty walking or climbing stairs?: Yes Weakness of Legs: Both Weakness of Arms/Hands: Both  Permission Sought/Granted                  Emotional Assessment Appearance:: Appears stated age Attitude/Demeanor/Rapport: Engaged Affect (typically observed): Accepting Orientation: : Oriented to Self, Oriented to Place, Oriented to  Time, Oriented to Situation Alcohol / Substance Use: Not Applicable Psych Involvement: No (comment)  Admission diagnosis:  Weakness [J62.8] Acute metabolic encephalopathy [B15.17] Patient Active Problem List   Diagnosis Date Noted   Acute metabolic encephalopathy 61/60/7371   Osteopenia 10/31/2021   Aortic atherosclerosis ([place]) 10/31/2021   Hepatic steatosis 10/31/2021   Generalized weakness 10/28/2021   Ambulatory dysfunction 10/28/2021   Falls frequently 10/26/2021   Constipation 10/26/2021   Bipolar 1 disorder ([place])    Neck pain 05/21/2021   Gait abnormality 05/21/2021   Right leg swelling 05/21/2021   Polyneuropathy due to type 2 diabetes mellitus ([place]) 10/10/2020   Seasonal allergies 10/10/2020   Controlled substance agreement signed 10/10/2020   Stress incontinence of urine 06/20/2020   Diplopia 03/20/2020   GERD (gastroesophageal reflux disease)    Essential hypertension    Incontinence    COPD (chronic obstructive pulmonary  disease) ([place])    Osteoarthritis of left knee 04/01/2019   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   Type 2 diabetes mellitus with hypoglycemia without coma, without long-term current use of insulin ([place]) 07/06/2018   Bilateral temporomandibular joint pain 01/21/2018   OSA (obstructive sleep apnea) 01/21/2018   Vertigo 01/21/2018    DDD (degenerative disc disease), cervical 07/17/2017   Degeneration of lumbar intervertebral disc 07/17/2017   OCD (obsessive compulsive disorder) 11/15/2015   Tobacco use disorder 11/15/2015   Current use of estrogen therapy 01/12/2014   PTSD (post-traumatic stress disorder) 08/03/2011   Bipolar I disorder, most recent episode mixed, severe with psychotic features ([place])    PCP:  Loman Brooklyn, FNP Pharmacy:   [facility], [place] [address] [place] [address] Phone: [phone] Fax: [phone]     Social Determinants of Health (SDOH) Interventions    Readmission Risk Interventions     No data to display

## 2021-11-22 NOTE — Plan of Care (Signed)
  Problem: Acute Rehab PT Goals(only PT should resolve) Goal: Pt Will Go Supine/Side To Sit Outcome: Progressing Flowsheets (Taken 11/22/2021 1213) Pt will go Supine/Side to Sit: with modified independence Goal: Patient Will Perform Sitting Balance Outcome: Progressing Flowsheets (Taken 11/22/2021 1213) Patient will perform sitting balance:  1-2 min  with bilateral UE support  with supervision  with modified independence Goal: Patient Will Transfer Sit To/From Stand Outcome: Progressing Flowsheets (Taken 11/22/2021 1213) Patient will transfer sit to/from stand:  with supervision  with modified independence Goal: Pt Will Transfer Bed To Chair/Chair To Bed Outcome: Progressing Flowsheets (Taken 11/22/2021 1213) Pt will Transfer Bed to Chair/Chair to Bed:  with modified independence  with supervision Goal: Pt Will Perform Standing Balance Or Pre-Gait Outcome: Progressing Flowsheets (Taken 11/22/2021 1213) Pt will perform standing balance or pre-gait:  1-2 min  with bilateral UE support  with Modified Independent  with Supervision Goal: Pt Will Ambulate Outcome: Progressing Flowsheets (Taken 11/22/2021 1213) Pt will Ambulate:  50 feet  with min guard assist  with minimal assist  with rolling walker   12:14 PM, 11/22/21 Lestine Box, S/PT

## 2021-11-22 NOTE — Care Management Obs Status (Signed)
[facility] NOTIFICATION   Patient Details  Name: Cynthia Deleon MRN: [id number] Date of Birth: 1955/12/11   Medicare Observation Status Notification Given:  Yes    Tommy Medal 11/22/2021, 12:13 PM

## 2021-11-22 NOTE — TOC Progression Note (Signed)
Transition of Care [facility]) - Progression Note    Patient Details  Name: Cynthia Deleon MRN: [id number] Date of Birth: February 07, 1956  Transition of Care [facility]) CM/SW Contact  Shade Flood, LCSW Phone Number: 11/22/2021, 1:43 PM  Clinical Narrative:     TOC following. Per Bryson Ha at [facility], they do not currently have female bed availability. Spoke with pt to review alternative CMS provider options. Referred to [facility] and [facility] at pt request. Insurance Josem Kaufmann has been started. Will update with SNF choice once available.  Expected Discharge Plan: [place] Barriers to Discharge: Continued Medical Work up  Expected Discharge Plan and Services Expected Discharge Plan: [place] Choice: [place] arrangements for the past 2 months: Single Family Home                                       Social Determinants of Health (SDOH) Interventions    Readmission Risk Interventions     No data to display

## 2021-11-22 NOTE — Evaluation (Signed)
Physical Therapy Evaluation Patient Details Name: Cynthia Deleon MRN: [id number] DOB: 1956/01/09 Today's Date: 11/22/2021  History of Present Illness  Cynthia Deleon is a 66 y.o. female with medical history significant for bipolar disorder, vertigo, multiple personality disorder, chronic back pain, COPD, depression, diabetes mellitus, GERD, hypertension, peripheral neuropathy presented to the ED via EMS due to encephalopathy with concerns for right gaze deviation and dysarthria.  Patient was noted to have some mild hypoxemia as well and is requiring 3 L nasal cannula.  She was last known well at approximately 1:30 AM.  She is known to take diazepam, oxycodone, and pregabalin on a regular basis at home.      Notably, she was recently admitted from 5/19 - 5/24 and was noted to have ambulatory dysfunction that was likely related to chronic pain with DJD, polypharmacy, and diabetic polyneuropathy.  She was also noted to have issues with acute urinary retention.  Her condition had improved rapidly during the course of her stay and she decided to go home despite initially being recommended SNF.   Clinical Impression  Patient presents supine in bed on 3 LPM and consents to PT evaluation. Patient placed on room air during evaluation with vitals monitored. Patient is modified independent with bed mobility with moderate labored movement and extended time needed to complete task. Demonstrates good trunk control with moving to EOB. Patient is min guard with sit to stand transfer with no AD but demonstrates balance deficits in once standing. Patient is min assist with bed to chair transfer with HHA provided by PT due to reaching for balance and stability during transfer. Limited a few steps ambulating in room with min/mod assist and use of RW. Moderate gait deviations impacting patient's ability to maintain upright position with patient also struggling with balance and coordination. Limited by generalized weakness  and fatigue. Vitals assessed during assessment with SpO2 averaging 96%. Patient tolerated sitting up in chair after therapy and left on room air. Patient will benefit from continued skilled physical therapy in hospital and recommended venue below to increase strength, balance, endurance for safe ADLs and gait.      Recommendations for follow up therapy are one component of a multi-disciplinary discharge planning process, led by the attending physician.  Recommendations may be updated based on patient status, additional functional criteria and insurance authorization.  Follow Up Recommendations Skilled nursing-short term rehab (<3 hours/day)    Assistance Recommended at Discharge Intermittent Supervision/Assistance  Patient can return home with the following  A little help with walking and/or transfers;A little help with bathing/dressing/bathroom;Assistance with cooking/housework;Assist for transportation;Help with stairs or ramp for entrance    Equipment Recommendations None recommended by PT  Recommendations for Other Services       Functional Status Assessment Patient has had a recent decline in their functional status and demonstrates the ability to make significant improvements in function in a reasonable and predictable amount of time.     Precautions / Restrictions Precautions Precautions: Fall Restrictions Weight Bearing Restrictions: No      Mobility  Bed Mobility Overal bed mobility: Modified Independent Bed Mobility: Supine to Sit     Supine to sit: Modified independent (Device/Increase time)     General bed mobility comments: Modified independent with supine to sit. Moderate slow labored movement with extended time needed but demonstrates good trunk control with getting to EOB once sitting up.    Transfers Overall transfer level: Needs assistance Equipment used: 1 person hand held assist Transfers: Sit to/from Stand  Sit to Stand: Min assist, Min guard            General transfer comment: Min guard with STS transfer with balance deficits observed once standing. Min assist with HHA provided by PT with bed to chair transfer. Very unstead with no AD used. Patient reaches for chair while lacking control of balance.    Ambulation/Gait Ambulation/Gait assistance: Min assist, Mod assist Gait Distance (Feet): 10 Feet Assistive device: Rolling walker (2 wheels) Gait Pattern/deviations: Step-to pattern, Decreased step length - left, Decreased step length - right, Knees buckling, Trunk flexed, Narrow base of support, Decreased stride length Gait velocity: decreased     General Gait Details: Min/mod assist with ambulating a few steps in room with RW. Moderate gait deviations with struggling to maintain upright position and weight shift impacting balance and coordination. Gait belt used. Forward lean in which patient struggles to control balance with upper body leaning outside BOS. Limited by generalized weakness and balance impairments.  Stairs            Wheelchair Mobility    Modified Rankin (Stroke Patients Only)       Balance Overall balance assessment: Needs assistance Sitting-balance support: Feet supported, Bilateral upper extremity supported Sitting balance-Leahy Scale: Fair Sitting balance - Comments: Fair seated EOB with UE support   Standing balance support: Bilateral upper extremity supported, Reliant on assistive device for balance, During functional activity Standing balance-Leahy Scale: Poor Standing balance comment: Forward lean outside BOS impacts balancing ability. Can't perform full knee extension into upright position for optimal balancing stance.                             Pertinent Vitals/Pain Pain Assessment Pain Assessment: No/denies pain    Home Living Family/patient expects to be discharged to:: Private residence Living Arrangements: Spouse/significant other Available Help at Discharge: Family Type  of Home: House Home Access: Level entry       Home Layout: One level Home Equipment: None      Prior Function Prior Level of Function : Needs assist       Physical Assist : ADLs (physical)   ADLs (physical): IADLs Mobility Comments: Patient reports walking independently at home but is a limited community ambulator. No AD used. ADLs Comments: Patient reports mainly being independent with ADL's but there are some ADL's she needs assist with. Unclear after questioning.     Hand Dominance        Extremity/Trunk Assessment   Upper Extremity Assessment Upper Extremity Assessment: Generalized weakness    Lower Extremity Assessment Lower Extremity Assessment: Generalized weakness    Cervical / Trunk Assessment Cervical / Trunk Assessment: Normal  Communication   Communication: No difficulties  Cognition Arousal/Alertness: Awake/alert Behavior During Therapy: WFL for tasks assessed/performed Overall Cognitive Status: Within Functional Limits for tasks assessed                                          General Comments      Exercises     Assessment/Plan    PT Assessment Patient needs continued PT services  PT Problem List Decreased strength;Decreased mobility;Decreased activity tolerance;Decreased balance;Decreased knowledge of use of DME       PT Treatment Interventions DME instruction;Therapeutic exercise;Gait training;Balance training;Neuromuscular re-education;Functional mobility training;Therapeutic activities;Patient/family education    PT Goals (Current goals can be found in  the Care Plan section)  Acute Rehab PT Goals Patient Stated Goal: return home PT Goal Formulation: With patient Time For Goal Achievement: 11/22/21 Potential to Achieve Goals: Fair    Frequency Min 3X/week     Co-evaluation               AM-PAC PT "6 Clicks" Mobility  Outcome Measure Help needed turning from your back to your side while in a flat bed  without using bedrails?: None Help needed moving from lying on your back to sitting on the side of a flat bed without using bedrails?: A Little Help needed moving to and from a bed to a chair (including a wheelchair)?: A Little Help needed standing up from a chair using your arms (e.g., wheelchair or bedside chair)?: A Little Help needed to walk in hospital room?: A Lot Help needed climbing 3-5 steps with a railing? : A Lot 6 Click Score: 17    End of Session Equipment Utilized During Treatment: Gait belt Activity Tolerance: Patient tolerated treatment well;Patient limited by fatigue;No increased pain Patient left: with call bell/phone within reach;in chair Nurse Communication: Mobility status PT Visit Diagnosis: Unsteadiness on feet (R26.81);Other abnormalities of gait and mobility (R26.89);Muscle weakness (generalized) (M62.81)    Time: 2641-5830 PT Time Calculation (min) (ACUTE ONLY): 31 min   Charges:   PT Evaluation $PT Eval Moderate Complexity: 1 Mod PT Treatments $Therapeutic Activity: 23-37 mins        12:13 PM, 11/22/21 Lestine Box, S/PT

## 2021-11-22 NOTE — NC FL2 (Signed)
[place] MEDICAID FL2 LEVEL OF CARE SCREENING TOOL     IDENTIFICATION  Patient Name: Cynthia Deleon Birthdate: 02-28-1956 Sex: female Admission Date (Current Location): 11/21/2021  [facility] and [place] Number:  [employer] and Address:  [facility] [address] [place]      Provider Number: [id number]  Attending Physician Name and Address:  Rodena Goldmann, DO  Relative Name and Phone Number:       Current Level of Care: Hospital Recommended Level of Care: [place] Prior Approval Number:    Date Approved/Denied:   PASRR Number: [id number]  Discharge Plan: SNF    Current Diagnoses: Patient Active Problem List   Diagnosis Date Noted   Acute metabolic encephalopathy 46/65/9935   Osteopenia 10/31/2021   Aortic atherosclerosis ([place]) 10/31/2021   Hepatic steatosis 10/31/2021   Generalized weakness 10/28/2021   Ambulatory dysfunction 10/28/2021   Falls frequently 10/26/2021   Constipation 10/26/2021   Bipolar 1 disorder ([place])    Neck pain 05/21/2021   Gait abnormality 05/21/2021   Right leg swelling 05/21/2021   Polyneuropathy due to type 2 diabetes mellitus ([place]) 10/10/2020   Seasonal allergies 10/10/2020   Controlled substance agreement signed 10/10/2020   Stress incontinence of urine 06/20/2020   Diplopia 03/20/2020   GERD (gastroesophageal reflux disease)    Essential hypertension    Incontinence    COPD (chronic obstructive pulmonary disease) ([place])    Osteoarthritis of left knee 04/01/2019   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   Type 2 diabetes mellitus with hypoglycemia without coma, without long-term current use of insulin (HCC) 07/06/2018   Bilateral temporomandibular joint pain 01/21/2018   OSA (obstructive sleep apnea) 01/21/2018   Vertigo 01/21/2018   DDD (degenerative disc disease), cervical 07/17/2017   Degeneration of lumbar intervertebral disc 07/17/2017   OCD (obsessive compulsive disorder)  11/15/2015   Tobacco use disorder 11/15/2015   Current use of estrogen therapy 01/12/2014   PTSD (post-traumatic stress disorder) 08/03/2011   Bipolar I disorder, most recent episode mixed, severe with psychotic features ([place])     Orientation RESPIRATION BLADDER Height & Weight     Self, Time, Situation, Place  Normal Continent Weight: 180 lb (81.6 kg) Height:  '5\' 9"'$  (175.3 cm)  BEHAVIORAL SYMPTOMS/MOOD NEUROLOGICAL BOWEL NUTRITION STATUS      Continent Diet (See D/C summary)  AMBULATORY STATUS COMMUNICATION OF NEEDS Skin   Extensive Assist Verbally Normal                       Personal Care Assistance Level of Assistance  Bathing, Feeding, Dressing Bathing Assistance: Limited assistance Feeding assistance: Independent Dressing Assistance: Limited assistance     Functional Limitations Info  Sight, Hearing, Speech Sight Info: Adequate Hearing Info: Adequate Speech Info: Adequate    SPECIAL CARE FACTORS FREQUENCY  PT (By licensed PT), OT (By licensed OT)     PT Frequency: 5 times weekly OT Frequency: 5 times weekly            Contractures Contractures Info: Not present    Additional Factors Info  Code Status, Allergies Code Status Info: FULL Allergies Info: Chantix (Varenicline) Divalproex Sodium Other Pseudoeph-hydrocodone-gg Sulfa Antibiotics Varenicline Tartrate Ambien (Zolpidem) Clavulanic Acid Codeine Elemental Sulfur Hydrocod Poli-chlorphe Poli Er Hydrocodone Macrobid (Nitrofurantoin) Paxil (Paroxetine Hcl) Prednisone Amoxicillin Farxiga (Dapagliflozin) Metformin And Related Tape           Current Medications (11/22/2021):  This is the current hospital active medication list Current Facility-Administered  Medications  Medication Dose Route Frequency Provider Last Rate Last Admin   acetaminophen (TYLENOL) tablet 650 mg  650 mg Oral Q6H PRN Heath Lark D, DO   650 mg at 11/22/21 0865   Or   acetaminophen (TYLENOL) suppository 650 mg  650 mg Rectal Q6H  PRN Manuella Ghazi, Pratik D, DO       albuterol (PROVENTIL) (2.5 MG/3ML) 0.083% nebulizer solution 2.5 mg  2.5 mg Nebulization Q4H PRN Manuella Ghazi, Pratik D, DO   2.5 mg at 11/22/21 0137   atorvastatin (LIPITOR) tablet 40 mg  40 mg Oral Daily Manuella Ghazi, Pratik D, DO   40 mg at 11/22/21 7846   Chlorhexidine Gluconate Cloth 2 % PADS 6 each  6 each Topical Daily Heath Lark D, DO   6 each at 11/22/21 1047   diazepam (VALIUM) tablet 4 mg  4 mg Oral BID Manuella Ghazi, Pratik D, DO   4 mg at 11/22/21 1038   DULoxetine (CYMBALTA) DR capsule 60 mg  60 mg Oral Daily Manuella Ghazi, Pratik D, DO   60 mg at 11/22/21 1038   enoxaparin (LOVENOX) injection 40 mg  40 mg Subcutaneous Q24H Manuella Ghazi, Pratik D, DO   40 mg at 11/21/21 1633   fluticasone (FLONASE) 50 MCG/ACT nasal spray 2 spray  2 spray Each Nare Daily Manuella Ghazi, Pratik D, DO   2 spray at 11/22/21 9629   guaiFENesin-dextromethorphan (ROBITUSSIN DM) 100-10 MG/5ML syrup 5 mL  5 mL Oral Q4H PRN Adefeso, Oladapo, DO   5 mL at 11/22/21 0629   insulin aspart (novoLOG) injection 0-15 Units  0-15 Units Subcutaneous TID WC Shah, Pratik D, DO   3 Units at 11/22/21 0855   insulin aspart (novoLOG) injection 0-5 Units  0-5 Units Subcutaneous QHS Manuella Ghazi, Pratik D, DO       lamoTRIgine (LAMICTAL) tablet 200 mg  200 mg Oral QHS Manuella Ghazi, Pratik D, DO   200 mg at 11/21/21 2220   linagliptin (TRADJENTA) tablet 5 mg  5 mg Oral Daily Manuella Ghazi, Pratik D, DO   5 mg at 11/22/21 0806   lisinopril (ZESTRIL) tablet 5 mg  5 mg Oral Daily Manuella Ghazi, Pratik D, DO   5 mg at 11/22/21 5284   meclizine (ANTIVERT) tablet 50 mg  50 mg Oral Daily PRN Manuella Ghazi, Pratik D, DO       montelukast (SINGULAIR) tablet 10 mg  10 mg Oral QHS Shah, Pratik D, DO   10 mg at 11/21/21 2220   naloxone [facility]) injection 0.4 mg  0.4 mg Intravenous PRN Heath Lark D, DO   0.4 mg at 11/21/21 1014   ondansetron (ZOFRAN) tablet 4 mg  4 mg Oral Q6H PRN Manuella Ghazi, Pratik D, DO       Or   ondansetron (ZOFRAN) injection 4 mg  4 mg Intravenous Q6H PRN Manuella Ghazi, Pratik D, DO        oxybutynin (DITROPAN-XL) 24 hr tablet 15 mg  15 mg Oral QHS Shah, Pratik D, DO   15 mg at 11/21/21 2235   oxyCODONE (Oxy IR/ROXICODONE) immediate release tablet 15 mg  15 mg Oral Q6H PRN Heath Lark D, DO   15 mg at 11/22/21 0515   pregabalin (LYRICA) capsule 150 mg  150 mg Oral TID Heath Lark D, DO   150 mg at 11/22/21 1324   promethazine (PHENERGAN) tablet 12.5 mg  12.5 mg Oral Q8H PRN Manuella Ghazi, Pratik D, DO       vitamin B-12 (CYANOCOBALAMIN) tablet 1,000 mcg  1,000 mcg Oral Daily Manuella Ghazi, Pratik D,  DO   1,000 mcg at 11/22/21 5749     Discharge Medications: Please see discharge summary for a list of discharge medications.  Relevant Imaging Results:  Relevant Lab Results:   Additional Information SSN: [id number], [place]

## 2021-11-22 NOTE — Progress Notes (Signed)
Removed pt's foley. Pt tolerated well. Will continue to monitor pt for urine output.  

## 2021-11-22 NOTE — Progress Notes (Signed)
PROGRESS NOTE    Cynthia Deleon  ACZ:[id number] DOB: [birth year] DOA: 11/21/2021 PCP: Loman Brooklyn, FNP   Brief Narrative:    Cynthia Deleon is a 66 y.o. female with medical history significant for bipolar disorder, vertigo, multiple personality disorder, chronic back pain, COPD, depression, diabetes mellitus, GERD, hypertension, peripheral neuropathy presented to the ED via EMS due to encephalopathy with concerns for right gaze deviation and dysarthria.  She was ruled out for CVA, but thought to have acute metabolic encephalopathy likely related to polypharmacy.  She continues to have gait disturbance and weakness with PT recommending SNF.  Assessment & Plan:   Principal Problem:   Acute metabolic encephalopathy Active Problems:   Falls frequently   Ambulatory dysfunction   COPD (chronic obstructive pulmonary disease) (HCC)   Degeneration of lumbar intervertebral disc   Essential hypertension   Polyneuropathy due to type 2 diabetes mellitus (HCC)   Bipolar 1 disorder (HCC)   Aortic atherosclerosis (HCC)   Hepatic steatosis   DDD (degenerative disc disease), cervical   Vertigo   GERD (gastroesophageal reflux disease)   Stress incontinence of urine  Assessment and Plan:  Acute metabolic encephalopathy-improved and at baseline -Multifactorial in the setting of polypharmacy -Already appears to be doing better and no further stroke work-up needed per teleneurology recommendations -Ammonia levels within normal limits -Recent B12 within normal limits -Recent TSH 5/19 0.478 -No sign of UTI noted -Imaging studies negative for stroke -Valium to be weaned and now starting 4 mg dose twice daily   Ambulatory dysfunction -Patient was able to ambulate well on previous discharge and appears to be independent at home -PT recommending SNF   Polyneuropathy due to type 2 diabetes with hyperglycemia -Continue on SSI while admitted -Hemoglobin A1c 5/19 7.3%   COPD -Currently  without acute exacerbation -Albuterol inhaler as needed   Bipolar 1 disorder -Continue duloxetine, Lamictal, and Lyrica   Acute urinary retention -Foley catheter placed in ED -Void trial today   Essential hypertension -Currently stable -Continue lisinopril   Hepatic steatosis -Follow-up outpatient   Aortic atherosclerosis -Statin    DVT prophylaxis: Lovenox Code Status: Full Family Communication: None at bedside Disposition Plan:  Status is: Observation The patient will require care spanning > 2 midnights and should be moved to inpatient because: SNF placement/rehab.   Consultants:  None  Procedures:  None  Antimicrobials:  None   Subjective: Patient seen and evaluated today with no new acute complaints or concerns. No acute concerns or events noted overnight.  Objective: Vitals:   11/22/21 0207 11/22/21 0511 11/22/21 0515 11/22/21 1247  BP: (!) 91/50 111/69 111/69 133/81  Pulse: 77 80 72 95  Resp: 16   16  Temp: 98 F (36.7 C)  98.2 F (36.8 C) 97.9 F (36.6 C)  TempSrc: Oral     SpO2: 95% 96% 95% 97%  Weight:      Height:        Intake/Output Summary (Last 24 hours) at 11/22/2021 1338 Last data filed at 11/22/2021 1045 Gross per 24 hour  Intake 840 ml  Output 3200 ml  Net -2360 ml   Filed Weights   11/21/21 1202  Weight: 81.6 kg    Examination:  General exam: Appears calm and comfortable  Respiratory system: Clear to auscultation. Respiratory effort normal.  Currently on 2 L nasal cannula Cardiovascular system: S1 & S2 heard, RRR.  Gastrointestinal system: Abdomen is soft Central nervous system: Alert and awake Extremities: No edema Skin: No significant lesions noted  Psychiatry: Flat affect. Foley with clear, yellow urine output    Data Reviewed: I have personally reviewed following labs and imaging studies  CBC: Recent Labs  Lab 11/21/21 0911 11/21/21 1104 11/22/21 0530  WBC 7.6  --  9.0  NEUTROABS 4.3  --   --   HGB 16.2*  16.3* 14.3  HCT 48.5* 48.0* 45.3  MCV 96.2  --  98.9  PLT 179  --  308   Basic Metabolic Panel: Recent Labs  Lab 11/21/21 0911 11/21/21 1104 11/22/21 0530  NA 137 138 136  K 4.9 5.1 4.3  CL 102 103 101  CO2 28  --  29  GLUCOSE 264* 236* 185*  BUN '14 17 15  '$ CREATININE 0.77 0.70 0.83  CALCIUM 10.0  --  9.0  MG  --   --  2.1   GFR: Estimated Creatinine Clearance: 77.2 mL/min (by C-G formula based on SCr of 0.83 mg/dL). Liver Function Tests: Recent Labs  Lab 11/21/21 0911  AST 20  ALT 13  ALKPHOS 85  BILITOT 1.0  PROT 7.3  ALBUMIN 4.3   No results for input(s): "LIPASE", "AMYLASE" in the last 168 hours. Recent Labs  Lab 11/21/21 1244  AMMONIA <10   Coagulation Profile: Recent Labs  Lab 11/21/21 1244  INR 1.0   Cardiac Enzymes: No results for input(s): "CKTOTAL", "CKMB", "CKMBINDEX", "TROPONINI" in the last 168 hours. BNP (last 3 results) No results for input(s): "PROBNP" in the last 8760 hours. HbA1C: No results for input(s): "HGBA1C" in the last 72 hours. CBG: Recent Labs  Lab 11/21/21 0854 11/21/21 1406 11/21/21 2052 11/22/21 1117  GLUCAP 228* 177* 193* 227*   Lipid Profile: No results for input(s): "CHOL", "HDL", "LDLCALC", "TRIG", "CHOLHDL", "LDLDIRECT" in the last 72 hours. Thyroid Function Tests: No results for input(s): "TSH", "T4TOTAL", "FREET4", "T3FREE", "THYROIDAB" in the last 72 hours. Anemia Panel: No results for input(s): "VITAMINB12", "FOLATE", "FERRITIN", "TIBC", "IRON", "RETICCTPCT" in the last 72 hours. Sepsis Labs: No results for input(s): "PROCALCITON", "LATICACIDVEN" in the last 168 hours.  Recent Results (from the past 240 hour(s))  Resp Panel by RT-PCR (Flu A&B, Covid) Anterior Nasal Swab     Status: None   Collection Time: 11/21/21  8:56 AM   Specimen: Anterior Nasal Swab  Result Value Ref Range Status   SARS Coronavirus 2 by RT PCR NEGATIVE NEGATIVE Final    Comment: (NOTE) SARS-CoV-2 target nucleic acids are NOT  DETECTED.  The SARS-CoV-2 RNA is generally detectable in upper respiratory specimens during the acute phase of infection. The lowest concentration of SARS-CoV-2 viral copies this assay can detect is 138 copies/mL. A negative result does not preclude SARS-Cov-2 infection and should not be used as the sole basis for treatment or other patient management decisions. A negative result may occur with  improper specimen collection/handling, submission of specimen other than nasopharyngeal swab, presence of viral mutation(s) within the areas targeted by this assay, and inadequate number of viral copies(<138 copies/mL). A negative result must be combined with clinical observations, patient history, and epidemiological information. The expected result is Negative.  Fact Sheet for Patients:  EntrepreneurPulse.com.au  Fact Sheet for Healthcare Providers:  IncredibleEmployment.be  This test is no t yet approved or cleared by the [place] FDA and  has been authorized for detection and/or diagnosis of SARS-CoV-2 by FDA under an Emergency Use Authorization (EUA). This EUA will remain  in effect (meaning this test can be used) for the duration of the COVID-19 declaration under Section 564(b)(1)  of the Act, 21 U.S.C.section 360bbb-3(b)(1), unless the authorization is terminated  or revoked sooner.       Influenza A by PCR NEGATIVE NEGATIVE Final   Influenza B by PCR NEGATIVE NEGATIVE Final    Comment: (NOTE) The Xpert Xpress SARS-CoV-2/FLU/RSV plus assay is intended as an aid in the diagnosis of influenza from Nasopharyngeal swab specimens and should not be used as a sole basis for treatment. Nasal washings and aspirates are unacceptable for Xpert Xpress SARS-CoV-2/FLU/RSV testing.  Fact Sheet for Patients: EntrepreneurPulse.com.au  Fact Sheet for Healthcare Providers: IncredibleEmployment.be  This test is not yet  approved or cleared by the [place] FDA and has been authorized for detection and/or diagnosis of SARS-CoV-2 by FDA under an Emergency Use Authorization (EUA). This EUA will remain in effect (meaning this test can be used) for the duration of the COVID-19 declaration under Section 564(b)(1) of the Act, 21 U.S.C. section 360bbb-3(b)(1), unless the authorization is terminated or revoked.  Performed at [facility], [address] [place], [place] [address]          Radiology Studies: CT Angio Chest PE W and/or Wo Contrast  Result Date: 11/21/2021 CLINICAL DATA:  Status post code stroke PTA. Chest pain and shortness of breath. EXAM: CT ANGIOGRAPHY CHEST WITH CONTRAST TECHNIQUE: Multidetector CT imaging of the chest was performed using the standard protocol during bolus administration of intravenous contrast. Multiplanar CT image reconstructions and MIPs were obtained to evaluate the vascular anatomy. RADIATION DOSE REDUCTION: This exam was performed according to the departmental dose-optimization program which includes automated exposure control, adjustment of the mA and/or kV according to patient size and/or use of iterative reconstruction technique. CONTRAST:  74m OMNIPAQUE IOHEXOL 350 MG/ML SOLN COMPARISON:  10/26/2021 FINDINGS: Cardiovascular: The heart is normal in size. No pericardial effusion. The aorta is normal in caliber. No dissection. Scattered atherosclerotic calcifications at the aortic arch. Scattered coronary artery calcifications. The pulmonary arterial tree is well opacified. No filling defects to suggest pulmonary embolism. Mediastinum/Nodes: No mediastinal or hilar mass or lymphadenopathy. The esophagus is grossly normal. Lungs/Pleura: Dependent subpleural atelectasis but no infiltrates or effusions. No worrisome pulmonary lesions or pulmonary nodules. The central tracheobronchial tree is unremarkable. Upper Abdomen: No significant upper abdominal findings. Musculoskeletal:  No significant bony findings. Review of the MIP images confirms the above findings. IMPRESSION: 1. No CT findings for pulmonary embolism. 2. Normal caliber thoracic aorta, no dissection. 3. Dependent subpleural atelectasis but no infiltrates or effusions. 4. Aortic atherosclerosis. Aortic Atherosclerosis (ICD10-I70.0). Electronically Signed   By: PMarijo SanesM.D.   On: 11/21/2021 11:58   DG Chest Portable 1 View  Result Date: 11/21/2021 CLINICAL DATA:  Hypoxia. EXAM: PORTABLE CHEST 1 VIEW COMPARISON:  Chest radiograph August 26, 2021. FINDINGS: Mildly prominent lung markings, chronic. No consolidation. No visible pleural effusions or pneumothorax. Cardiomediastinal silhouette is within normal limits. No displaced fracture. IMPRESSION: No evidence of acute cardiopulmonary disease. Electronically Signed   By: FMargaretha SheffieldM.D.   On: 11/21/2021 10:55   MR BRAIN WO CONTRAST  Result Date: 11/21/2021 CLINICAL DATA:  Neuro deficit, acute, stroke suspected; carotid artery stenosis screening, risk factors EXAM: MRI HEAD WITHOUT CONTRAST MRA HEAD WITHOUT CONTRAST MRA NECK WITHOUT AND WITH CONTRAST TECHNIQUE: Multiplanar, multi-echo pulse sequences of the brain and surrounding structures were acquired without intravenous contrast. Angiographic images of the Circle of Willis were acquired using MRA technique without intravenous contrast. Angiographic images of the neck were acquired using MRA technique without and with intravenous contrast. Carotid  stenosis measurements (when applicable) are obtained utilizing NASCET criteria, using the distal internal carotid diameter as the denominator. CONTRAST:  38m GADAVIST GADOBUTROL 1 MMOL/ML IV SOLN COMPARISON:  MRI head October 2021 FINDINGS: MRI HEAD Brain: There is no acute infarction or intracranial hemorrhage. There is no intracranial mass, mass effect, or edema. Ventricles and sulci are normal in size and configuration. Small focus of T2 hyperintensity in the left  frontal subcortical white matter likely reflects nonspecific gliosis/demyelination of doubtful significance. There is no hydrocephalus or extra-axial fluid collection. Vascular: Major vessel flow voids at the skull base are preserved. Skull and upper cervical spine: Normal marrow signal is preserved. Sinuses/Orbits: Paranasal sinuses are aerated. Orbits are unremarkable. Other: Sella is unremarkable.  Mastoid air cells are clear. MRA HEAD Degraded by motion. Intracranial internal carotid arteries are patent with atherosclerotic irregularity. Proximal middle and anterior cerebral arteries are patent. Intracranial vertebral arteries, basilar artery, proximal posterior cerebral arteries are patent. Right posterior communicating artery is identified. MRA NECK Great vessel origins are patent. Common, internal, and external carotid arteries are patent. Plaque at the left greater than right ICA origins with minimal stenosis. Extracranial codominant vertebral arteries are patent without stenosis. IMPRESSION: No acute infarction, hemorrhage, or mass. No large vessel occlusion or hemodynamically significant stenosis. Electronically Signed   By: PMacy MisM.D.   On: 11/21/2021 10:18   MR ANGIO HEAD WO CONTRAST  Result Date: 11/21/2021 CLINICAL DATA:  Neuro deficit, acute, stroke suspected; carotid artery stenosis screening, risk factors EXAM: MRI HEAD WITHOUT CONTRAST MRA HEAD WITHOUT CONTRAST MRA NECK WITHOUT AND WITH CONTRAST TECHNIQUE: Multiplanar, multi-echo pulse sequences of the brain and surrounding structures were acquired without intravenous contrast. Angiographic images of the Circle of Willis were acquired using MRA technique without intravenous contrast. Angiographic images of the neck were acquired using MRA technique without and with intravenous contrast. Carotid stenosis measurements (when applicable) are obtained utilizing NASCET criteria, using the distal internal carotid diameter as the denominator.  CONTRAST:  864mGADAVIST GADOBUTROL 1 MMOL/ML IV SOLN COMPARISON:  MRI head October 2021 FINDINGS: MRI HEAD Brain: There is no acute infarction or intracranial hemorrhage. There is no intracranial mass, mass effect, or edema. Ventricles and sulci are normal in size and configuration. Small focus of T2 hyperintensity in the left frontal subcortical white matter likely reflects nonspecific gliosis/demyelination of doubtful significance. There is no hydrocephalus or extra-axial fluid collection. Vascular: Major vessel flow voids at the skull base are preserved. Skull and upper cervical spine: Normal marrow signal is preserved. Sinuses/Orbits: Paranasal sinuses are aerated. Orbits are unremarkable. Other: Sella is unremarkable.  Mastoid air cells are clear. MRA HEAD Degraded by motion. Intracranial internal carotid arteries are patent with atherosclerotic irregularity. Proximal middle and anterior cerebral arteries are patent. Intracranial vertebral arteries, basilar artery, proximal posterior cerebral arteries are patent. Right posterior communicating artery is identified. MRA NECK Great vessel origins are patent. Common, internal, and external carotid arteries are patent. Plaque at the left greater than right ICA origins with minimal stenosis. Extracranial codominant vertebral arteries are patent without stenosis. IMPRESSION: No acute infarction, hemorrhage, or mass. No large vessel occlusion or hemodynamically significant stenosis. Electronically Signed   By: PrMacy Mis.D.   On: 11/21/2021 10:18   MR ANGIO NECK W WO CONTRAST  Result Date: 11/21/2021 CLINICAL DATA:  Neuro deficit, acute, stroke suspected; carotid artery stenosis screening, risk factors EXAM: MRI HEAD WITHOUT CONTRAST MRA HEAD WITHOUT CONTRAST MRA NECK WITHOUT AND WITH CONTRAST TECHNIQUE: Multiplanar, multi-echo pulse sequences of  the brain and surrounding structures were acquired without intravenous contrast. Angiographic images of the Circle  of Willis were acquired using MRA technique without intravenous contrast. Angiographic images of the neck were acquired using MRA technique without and with intravenous contrast. Carotid stenosis measurements (when applicable) are obtained utilizing NASCET criteria, using the distal internal carotid diameter as the denominator. CONTRAST:  26m GADAVIST GADOBUTROL 1 MMOL/ML IV SOLN COMPARISON:  MRI head October 2021 FINDINGS: MRI HEAD Brain: There is no acute infarction or intracranial hemorrhage. There is no intracranial mass, mass effect, or edema. Ventricles and sulci are normal in size and configuration. Small focus of T2 hyperintensity in the left frontal subcortical white matter likely reflects nonspecific gliosis/demyelination of doubtful significance. There is no hydrocephalus or extra-axial fluid collection. Vascular: Major vessel flow voids at the skull base are preserved. Skull and upper cervical spine: Normal marrow signal is preserved. Sinuses/Orbits: Paranasal sinuses are aerated. Orbits are unremarkable. Other: Sella is unremarkable.  Mastoid air cells are clear. MRA HEAD Degraded by motion. Intracranial internal carotid arteries are patent with atherosclerotic irregularity. Proximal middle and anterior cerebral arteries are patent. Intracranial vertebral arteries, basilar artery, proximal posterior cerebral arteries are patent. Right posterior communicating artery is identified. MRA NECK Great vessel origins are patent. Common, internal, and external carotid arteries are patent. Plaque at the left greater than right ICA origins with minimal stenosis. Extracranial codominant vertebral arteries are patent without stenosis. IMPRESSION: No acute infarction, hemorrhage, or mass. No large vessel occlusion or hemodynamically significant stenosis. Electronically Signed   By: PMacy MisM.D.   On: 11/21/2021 10:18   CT HEAD CODE STROKE WO CONTRAST  Result Date: 11/21/2021 CLINICAL DATA:  Code stroke.  66year old female. Altered mental status. EXAM: CT HEAD WITHOUT CONTRAST TECHNIQUE: Contiguous axial images were obtained from the base of the skull through the vertex without intravenous contrast. RADIATION DOSE REDUCTION: This exam was performed according to the departmental dose-optimization program which includes automated exposure control, adjustment of the mA and/or kV according to patient size and/or use of iterative reconstruction technique. COMPARISON:  Brain MRI 03/28/2020.  Head CT 10/27/2021. FINDINGS: Brain: No midline shift, ventriculomegaly, mass effect, evidence of mass lesion, intracranial hemorrhage or evidence of cortically based acute infarction. Gray-white matter differentiation is within normal limits throughout the brain. Vascular: No suspicious intracranial vascular hyperdensity. Skull: Chronic traumatic and/or postoperative nasal bone changes. No acute osseous abnormality identified. Sinuses/Orbits: Clear. Other: Visualized orbits and scalp soft tissues are within normal limits. ASPECTS ([facility] Early [facility] Score) Total score (0-10 with 10 being normal): 10 IMPRESSION: Stable and normal for age noncontrast CT appearance of the brain. Study discussed by telephone with Dr. MTeressa Loweron 11/21/2021 at 09:14 . Electronically Signed   By: HGenevie AnnM.D.   On: 11/21/2021 09:14        Scheduled Meds:  atorvastatin  40 mg Oral Daily   Chlorhexidine Gluconate Cloth  6 each Topical Daily   diazepam  4 mg Oral BID   DULoxetine  60 mg Oral Daily   enoxaparin (LOVENOX) injection  40 mg Subcutaneous Q24H   fluticasone  2 spray Each Nare Daily   insulin aspart  0-15 Units Subcutaneous TID WC   insulin aspart  0-5 Units Subcutaneous QHS   lamoTRIgine  200 mg Oral QHS   linagliptin  5 mg Oral Daily   lisinopril  5 mg Oral Daily   montelukast  10 mg Oral QHS   oxybutynin  15 mg Oral QHS  pregabalin  150 mg Oral TID   cyanocobalamin  1,000 mcg Oral Daily     LOS: 0 days     Time spent: 35 minutes    Timmy Cleverly Darleen Crocker, DO [facility]  If 7PM-7AM, please contact night-coverage [email or web address] 11/22/2021, 1:38 PM

## 2021-11-22 NOTE — Progress Notes (Signed)
Pt started the day drowsy but alert and responsive when spoken to. Pt is able to express her needs to staff. PT attempted to ambulate pt but she was too unsteady to proceed. She is not currently on O2 and sat's are 98% on RA.

## 2021-11-23 ENCOUNTER — Observation Stay (HOSPITAL_COMMUNITY): Payer: Medicare Other

## 2021-11-23 ENCOUNTER — Ambulatory Visit: Payer: Self-pay | Admitting: Pulmonary Disease

## 2021-11-23 DIAGNOSIS — Z87891 Personal history of nicotine dependence: Secondary | ICD-10-CM | POA: Diagnosis not present

## 2021-11-23 DIAGNOSIS — R059 Cough, unspecified: Secondary | ICD-10-CM | POA: Diagnosis not present

## 2021-11-23 DIAGNOSIS — I10 Essential (primary) hypertension: Secondary | ICD-10-CM | POA: Diagnosis not present

## 2021-11-23 DIAGNOSIS — Z79899 Other long term (current) drug therapy: Secondary | ICD-10-CM | POA: Diagnosis not present

## 2021-11-23 DIAGNOSIS — E119 Type 2 diabetes mellitus without complications: Secondary | ICD-10-CM | POA: Diagnosis not present

## 2021-11-23 DIAGNOSIS — R339 Retention of urine, unspecified: Secondary | ICD-10-CM | POA: Diagnosis not present

## 2021-11-23 DIAGNOSIS — Z7985 Long-term (current) use of injectable non-insulin antidiabetic drugs: Secondary | ICD-10-CM | POA: Diagnosis not present

## 2021-11-23 DIAGNOSIS — M549 Dorsalgia, unspecified: Secondary | ICD-10-CM | POA: Diagnosis not present

## 2021-11-23 DIAGNOSIS — I700 Atherosclerosis of aorta: Secondary | ICD-10-CM | POA: Diagnosis not present

## 2021-11-23 DIAGNOSIS — G4733 Obstructive sleep apnea (adult) (pediatric): Secondary | ICD-10-CM | POA: Diagnosis not present

## 2021-11-23 DIAGNOSIS — I2510 Atherosclerotic heart disease of native coronary artery without angina pectoris: Secondary | ICD-10-CM | POA: Diagnosis not present

## 2021-11-23 DIAGNOSIS — E1165 Type 2 diabetes mellitus with hyperglycemia: Secondary | ICD-10-CM | POA: Diagnosis not present

## 2021-11-23 DIAGNOSIS — M6281 Muscle weakness (generalized): Secondary | ICD-10-CM | POA: Diagnosis not present

## 2021-11-23 DIAGNOSIS — G629 Polyneuropathy, unspecified: Secondary | ICD-10-CM | POA: Diagnosis not present

## 2021-11-23 DIAGNOSIS — Z79891 Long term (current) use of opiate analgesic: Secondary | ICD-10-CM | POA: Diagnosis not present

## 2021-11-23 DIAGNOSIS — E1142 Type 2 diabetes mellitus with diabetic polyneuropathy: Secondary | ICD-10-CM | POA: Diagnosis not present

## 2021-11-23 DIAGNOSIS — K760 Fatty (change of) liver, not elsewhere classified: Secondary | ICD-10-CM | POA: Diagnosis not present

## 2021-11-23 DIAGNOSIS — R2689 Other abnormalities of gait and mobility: Secondary | ICD-10-CM | POA: Diagnosis not present

## 2021-11-23 DIAGNOSIS — R269 Unspecified abnormalities of gait and mobility: Secondary | ICD-10-CM | POA: Diagnosis not present

## 2021-11-23 DIAGNOSIS — G9341 Metabolic encephalopathy: Secondary | ICD-10-CM | POA: Diagnosis not present

## 2021-11-23 DIAGNOSIS — R0902 Hypoxemia: Secondary | ICD-10-CM | POA: Diagnosis not present

## 2021-11-23 DIAGNOSIS — G8929 Other chronic pain: Secondary | ICD-10-CM | POA: Diagnosis not present

## 2021-11-23 DIAGNOSIS — Z8349 Family history of other endocrine, nutritional and metabolic diseases: Secondary | ICD-10-CM | POA: Diagnosis not present

## 2021-11-23 DIAGNOSIS — Z20822 Contact with and (suspected) exposure to covid-19: Secondary | ICD-10-CM | POA: Diagnosis not present

## 2021-11-23 DIAGNOSIS — K219 Gastro-esophageal reflux disease without esophagitis: Secondary | ICD-10-CM | POA: Diagnosis not present

## 2021-11-23 DIAGNOSIS — R4781 Slurred speech: Secondary | ICD-10-CM | POA: Diagnosis not present

## 2021-11-23 DIAGNOSIS — M5030 Other cervical disc degeneration, unspecified cervical region: Secondary | ICD-10-CM | POA: Diagnosis not present

## 2021-11-23 DIAGNOSIS — Z825 Family history of asthma and other chronic lower respiratory diseases: Secondary | ICD-10-CM | POA: Diagnosis not present

## 2021-11-23 DIAGNOSIS — Z7984 Long term (current) use of oral hypoglycemic drugs: Secondary | ICD-10-CM | POA: Diagnosis not present

## 2021-11-23 DIAGNOSIS — H5509 Other forms of nystagmus: Secondary | ICD-10-CM | POA: Diagnosis not present

## 2021-11-23 DIAGNOSIS — R42 Dizziness and giddiness: Secondary | ICD-10-CM | POA: Diagnosis not present

## 2021-11-23 DIAGNOSIS — G894 Chronic pain syndrome: Secondary | ICD-10-CM | POA: Diagnosis not present

## 2021-11-23 DIAGNOSIS — J449 Chronic obstructive pulmonary disease, unspecified: Secondary | ICD-10-CM | POA: Diagnosis not present

## 2021-11-23 DIAGNOSIS — M545 Low back pain, unspecified: Secondary | ICD-10-CM | POA: Diagnosis not present

## 2021-11-23 DIAGNOSIS — R296 Repeated falls: Secondary | ICD-10-CM | POA: Diagnosis not present

## 2021-11-23 DIAGNOSIS — Z833 Family history of diabetes mellitus: Secondary | ICD-10-CM | POA: Diagnosis not present

## 2021-11-23 LAB — GLUCOSE, CAPILLARY
Glucose-Capillary: 202 mg/dL — ABNORMAL HIGH (ref 70–99)
Glucose-Capillary: 202 mg/dL — ABNORMAL HIGH (ref 70–99)

## 2021-11-23 MED ORDER — GUAIFENESIN-DM 100-10 MG/5ML PO SYRP: 5.0000 mL | ORAL_SOLUTION | ORAL | 0 refills | Status: DC | PRN

## 2021-11-23 MED ORDER — DIAZEPAM 2 MG PO TABS
ORAL_TABLET | ORAL | 0 refills | Status: AC
Start: 2021-11-23 — End: 2021-12-05

## 2021-11-23 MED ORDER — OXYCODONE HCL 15 MG PO TABS
15.0000 mg | ORAL_TABLET | Freq: Four times a day (QID) | ORAL | 0 refills | Status: AC | PRN
Start: 2021-11-23 — End: ?

## 2021-11-23 MED ORDER — CEFDINIR 300 MG PO CAPS: 300.0000 mg | ORAL_CAPSULE | Freq: Two times a day (BID) | ORAL | 0 refills | Status: AC

## 2021-11-23 NOTE — Progress Notes (Signed)
Patient discharged to [facility] today, transported by [employer]. Discharge paperwork placed in packet to give to facility. Belongings sent with patient.

## 2021-11-23 NOTE — Inpatient Diabetes Management (Signed)
Inpatient Diabetes Program Recommendations  AACE/ADA: New Consensus Statement on Inpatient Glycemic Control   Target Ranges:  Prepandial:   less than 140 mg/dL      Peak postprandial:   less than 180 mg/dL (1-2 hours)      Critically ill patients:  140 - 180 mg/dL    Latest Reference Range & Units 11/22/21 11:17 11/22/21 17:33 11/22/21 20:51 11/23/21 08:00  Glucose-Capillary 70 - 99 mg/dL 227 (H) 191 (H) 291 (H) 202 (H)   Review of Glycemic Control  Diabetes history: DM2 Outpatient Diabetes medications: Trulicity 8.33 mg QWeek, Glipizide XL 5 mg BID, Januvia 100 mg daily Current orders for Inpatient glycemic control: Novolog 0-15 units TID with meals, Novolog 0-5 units QHS, Tradjenta 5 mg daily  Inpatient Diabetes Program Recommendations:    Insulin: Please consider ordering Novolog 3 units TID with meals for meal coverage if patient eats at least 50% of meals.  Thanks, Barnie Alderman, RN, MSN, [place] Diabetes Coordinator Inpatient Diabetes Program [phone] (Team Pager from 8am to [place])

## 2021-11-23 NOTE — Discharge Summary (Signed)
Physician Discharge Summary  Cynthia Deleon ZSM:[id number] DOB: 23-Aug-1955 DOA: 11/21/2021  PCP: Loman Brooklyn, FNP  Admit date: 11/21/2021  Discharge date: 11/23/2021  Admitted From:Home  Disposition:  SNF  Recommendations for Outpatient Follow-up:  Follow up with PCP in 1-2 weeks Remain on Valium taper as prescribed and continue other home medications as previous, but consider weaning other excessive medications in the future if possible given concerns for polypharmacy Remain on Omnicef for total 7 days given pneumonia  Home Health: None  Equipment/Devices: None  Discharge Condition:Stable  CODE STATUS: Full  Diet recommendation: Heart Healthy/carb modified  Brief/Interim Summary: Cynthia Deleon is a 66 y.o. female with medical history significant for bipolar disorder, vertigo, multiple personality disorder, chronic back pain, COPD, depression, diabetes mellitus, GERD, hypertension, peripheral neuropathy presented to the ED via EMS due to encephalopathy with concerns for right gaze deviation and dysarthria.  She was ruled out for CVA, but thought to have acute metabolic encephalopathy likely related to polypharmacy.  She continues to have gait disturbance and weakness with PT recommending SNF.  She is in stable condition for discharge today, but is noted to have developed a mild cough and chest x-ray demonstrates findings of right lower lobe pneumonia.  She will be started on Omnicef which can be continued in the outpatient/SNF setting.  Additionally, she will have her Valium tapered to off and this has been ordered.  Hopefully this will help in preventing further readmissions as she does have quite a bit of polypharmacy.  No other acute events noted throughout the course of the stay and she is stable for discharge.  Discharge Diagnoses:  Principal Problem:   Acute metabolic encephalopathy Active Problems:   Falls frequently   Ambulatory dysfunction   COPD (chronic  obstructive pulmonary disease) (HCC)   Degeneration of lumbar intervertebral disc   Essential hypertension   Polyneuropathy due to type 2 diabetes mellitus (HCC)   Bipolar 1 disorder (HCC)   Aortic atherosclerosis (HCC)   Hepatic steatosis   DDD (degenerative disc disease), cervical   Vertigo   GERD (gastroesophageal reflux disease)   Stress incontinence of urine  Principal discharge diagnosis: Acute metabolic encephalopathy in the setting of polypharmacy with associated ambulatory dysfunction.  Right lower lobe community-acquired pneumonia.  Discharge Instructions  Discharge Instructions     Diet - low sodium heart healthy   Complete by: As directed    Increase activity slowly   Complete by: As directed       Allergies as of 11/23/2021       Reactions   Chantix [varenicline] Other (See Comments)   Altered mental status-Per patient was put on allergy list by Dr Lorriane Shire bu patient staes she is not allergic to this and is currently taking it.    Divalproex Sodium Other (See Comments)   Hallucinations  Other reaction(s): Confusion   Other Anaphylaxis   Pseudoeph-hydrocodone-gg Nausea Only   Sulfa Antibiotics Anaphylaxis, Nausea Only, Swelling, Other (See Comments)   Swelling of tongue.   Varenicline Tartrate Other (See Comments)   Other reaction(s): Confusion   Ambien [zolpidem] Other (See Comments)   Causes sleep walking   Clavulanic Acid Diarrhea   Codeine Nausea And Vomiting, Other (See Comments)   Elemental Sulfur Other (See Comments)   Swelling of tongue   Hydrocod Poli-chlorphe Poli Er Other (See Comments)   Other reaction(s): Skin itches   Hydrocodone Nausea And Vomiting   Macrobid [nitrofurantoin] Nausea And Vomiting   Dizziness, nausea, vomiting  Paxil [paroxetine Hcl] Other (See Comments)   Causes ringing in the ears.    Prednisone Other (See Comments)   Other reaction(s): Unknown   Amoxicillin Rash   Has patient had a PCN reaction causing immediate  rash, facial/tongue/throat swelling, SOB or lightheadedness with hypotension: No Has patient had a PCN reaction causing severe rash involving mucus membranes or skin necrosis: No Has patient had a PCN reaction that required hospitalization: No Has patient had a PCN reaction occurring within the last 10 years: Yes If all of the above answers are "NO", then may proceed with Cephalosporin use.   Farxiga [dapagliflozin]    RECURRENT YEAST/UTI   Metformin And Related Diarrhea   Tape Rash        Medication List     TAKE these medications    albuterol 108 (90 Base) MCG/ACT inhaler Commonly known as: VENTOLIN HFA Inhale 2 puffs into the lungs every 6 (six) hours as needed. What changed: reasons to take this   atorvastatin 40 MG tablet Commonly known as: LIPITOR TAKE ONE TABLET BY MOUTH ONCE DAILY.   Belsomra 20 MG Tabs Generic drug: Suvorexant Take 1 tablet by mouth at bedtime.   cefdinir 300 MG capsule Commonly known as: OMNICEF Take 1 capsule (300 mg total) by mouth 2 (two) times daily for 5 days.   Contour Next Test test strip Generic drug: glucose blood Test BS 4 times daily Dx E11.9   cyanocobalamin 1000 MCG tablet Take 1 tablet (1,000 mcg total) by mouth daily.   diazepam 2 MG tablet Commonly known as: VALIUM Take 2 tablets (4 mg total) by mouth in the morning and at bedtime for 3 days, THEN 1.5 tablets (3 mg total) in the morning and at bedtime for 3 days, THEN 1 tablet (2 mg total) in the morning and at bedtime for 3 days, THEN 0.5 tablets (1 mg total) in the morning and at bedtime for 3 days. Start taking on: November 23, 2021 What changed:  medication strength See the new instructions.   DULoxetine 60 MG capsule Commonly known as: Cymbalta Take 1 capsule (60 mg total) by mouth daily.   fluticasone 50 MCG/ACT nasal spray Commonly known as: FLONASE USE 2 SPRAYS IN EACH NOSTRIL ONCE DAILY.   glipiZIDE 5 MG 24 hr tablet Commonly known as: GLUCOTROL XL Take 1  tablet (5 mg total) by mouth 2 (two) times daily with a meal.   guaiFENesin-dextromethorphan 100-10 MG/5ML syrup Commonly known as: ROBITUSSIN DM Take 5 mLs by mouth every 4 (four) hours as needed for cough.   Januvia 100 MG tablet Generic drug: sitaGLIPtin TAKE (1) TABLET BY MOUTH ONCE DAILY. What changed: See the new instructions.   lamoTRIgine 200 MG tablet Commonly known as: LAMICTAL Take 200 mg by mouth at bedtime. For mood disorder.   lansoprazole 30 MG capsule Commonly known as: PREVACID TAKE (1) CAPSULE BY MOUTH ONCE DAILY. What changed: See the new instructions.   lisinopril 5 MG tablet Commonly known as: ZESTRIL TAKE (1) TABLET BY MOUTH ONCE DAILY. What changed: See the new instructions.   meclizine 25 MG tablet Commonly known as: ANTIVERT Take 2 tablets (50 mg total) by mouth daily as needed for dizziness.   montelukast 10 MG tablet Commonly known as: SINGULAIR TAKE (1) TABLET BY MOUTH AT BEDTIME.   OneTouch Delica Lancets 77L Misc Use to test blood sugar twice daily. DX: E11.9   oxybutynin 15 MG 24 hr tablet Commonly known as: DITROPAN XL Take 1 tablet (15 mg  total) by mouth at bedtime.   oxyCODONE 15 MG immediate release tablet Commonly known as: ROXICODONE Take 1 tablet (15 mg total) by mouth every 6 (six) hours as needed for pain.   pregabalin 150 MG capsule Commonly known as: LYRICA Take 1 capsule (150 mg total) by mouth 3 (three) times daily.   Premarin 0.3 MG tablet Generic drug: estrogens (conjugated) TAKE (1) TABLET BY MOUTH ONCE DAILY. What changed: See the new instructions.   promethazine 12.5 MG tablet Commonly known as: PHENERGAN Take 1 tablet (12.5 mg total) by mouth every 8 (eight) hours as needed for nausea or vomiting.   Trulicity 4.09 BD/5.3GD Sopn Generic drug: Dulaglutide Inject 0.75 mg into the skin once a week. DX: E11.65   valACYclovir 500 MG tablet Commonly known as: VALTREX TAKE (1) TABLET BY MOUTH ONCE DAILY AS NEEDED  FOR SHINGLES FLARE UP. What changed: See the new instructions.   Vitamin D3 25 MCG tablet Commonly known as: Vitamin D Take 1 tablet (1,000 Units total) by mouth daily.        Contact information for follow-up providers     Loman Brooklyn, FNP. Schedule an appointment as soon as possible for a visit in 1 week(s).   Specialty: Family Medicine Contact information: [place] [address] [phone]              Contact information for after-discharge care     [facility] .   Service: Skilled Nursing Contact information: [place] [phone]                    Allergies  Allergen Reactions   Chantix [Varenicline] Other (See Comments)    Altered mental status-Per patient was put on allergy list by Dr Lorriane Shire bu patient staes she is not allergic to this and is currently taking it.    Divalproex Sodium Other (See Comments)    Hallucinations  Other reaction(s): Confusion   Other Anaphylaxis   Pseudoeph-Hydrocodone-Gg Nausea Only   Sulfa Antibiotics Anaphylaxis, Nausea Only, Swelling and Other (See Comments)    Swelling of tongue.   Varenicline Tartrate Other (See Comments)    Other reaction(s): Confusion   Ambien [Zolpidem] Other (See Comments)    Causes sleep walking   Clavulanic Acid Diarrhea   Codeine Nausea And Vomiting and Other (See Comments)   Elemental Sulfur Other (See Comments)    Swelling of tongue   Hydrocod Poli-Chlorphe Poli Er Other (See Comments)    Other reaction(s): Skin itches    Hydrocodone Nausea And Vomiting   Macrobid [Nitrofurantoin] Nausea And Vomiting    Dizziness, nausea, vomiting   Paxil [Paroxetine Hcl] Other (See Comments)    Causes ringing in the ears.    Prednisone Other (See Comments)    Other reaction(s): Unknown    Amoxicillin Rash    Has patient had a PCN reaction causing immediate rash,  facial/tongue/throat swelling, SOB or lightheadedness with hypotension: No Has patient had a PCN reaction causing severe rash involving mucus membranes or skin necrosis: No Has patient had a PCN reaction that required hospitalization: No Has patient had a PCN reaction occurring within the last 10 years: Yes If all of the above answers are "NO", then may proceed with Cephalosporin use.    Farxiga [Dapagliflozin]     RECURRENT YEAST/UTI   Metformin And Related Diarrhea   Tape Rash    Consultations: None  Procedures/Studies: DG CHEST PORT 1 VIEW  Result Date: 11/23/2021 CLINICAL DATA:  Provided history: Cough and congestion. EXAM: PORTABLE CHEST 1 VIEW COMPARISON:  Chest CT 11/21/2021. Prior chest radiographs 11/21/2021 and earlier. FINDINGS: Heart size within normal limits. Mild elevation of the right hemidiaphragm, new from the prior exam. Opacity within the right lung base, also new. No definite pleural effusion or evidence of pneumothorax. No acute bony abnormality identified. IMPRESSION: Opacity within the right lung base, new from prior exams. Given that there is also new mild elevation of the right hemidiaphragm, this likely at least partially reflects atelectasis. However, superimposed right lower lobe pneumonia cannot be excluded. Correlate clinically and consider radiographic follow-up. Electronically Signed   By: Kellie Simmering D.O.   On: 11/23/2021 10:08   CT Angio Chest PE W and/or Wo Contrast  Result Date: 11/21/2021 CLINICAL DATA:  Status post code stroke PTA. Chest pain and shortness of breath. EXAM: CT ANGIOGRAPHY CHEST WITH CONTRAST TECHNIQUE: Multidetector CT imaging of the chest was performed using the standard protocol during bolus administration of intravenous contrast. Multiplanar CT image reconstructions and MIPs were obtained to evaluate the vascular anatomy. RADIATION DOSE REDUCTION: This exam was performed according to the departmental dose-optimization program which  includes automated exposure control, adjustment of the mA and/or kV according to patient size and/or use of iterative reconstruction technique. CONTRAST:  56m OMNIPAQUE IOHEXOL 350 MG/ML SOLN COMPARISON:  10/26/2021 FINDINGS: Cardiovascular: The heart is normal in size. No pericardial effusion. The aorta is normal in caliber. No dissection. Scattered atherosclerotic calcifications at the aortic arch. Scattered coronary artery calcifications. The pulmonary arterial tree is well opacified. No filling defects to suggest pulmonary embolism. Mediastinum/Nodes: No mediastinal or hilar mass or lymphadenopathy. The esophagus is grossly normal. Lungs/Pleura: Dependent subpleural atelectasis but no infiltrates or effusions. No worrisome pulmonary lesions or pulmonary nodules. The central tracheobronchial tree is unremarkable. Upper Abdomen: No significant upper abdominal findings. Musculoskeletal: No significant bony findings. Review of the MIP images confirms the above findings. IMPRESSION: 1. No CT findings for pulmonary embolism. 2. Normal caliber thoracic aorta, no dissection. 3. Dependent subpleural atelectasis but no infiltrates or effusions. 4. Aortic atherosclerosis. Aortic Atherosclerosis (ICD10-I70.0). Electronically Signed   By: PMarijo SanesM.D.   On: 11/21/2021 11:58   DG Chest Portable 1 View  Result Date: 11/21/2021 CLINICAL DATA:  Hypoxia. EXAM: PORTABLE CHEST 1 VIEW COMPARISON:  Chest radiograph August 26, 2021. FINDINGS: Mildly prominent lung markings, chronic. No consolidation. No visible pleural effusions or pneumothorax. Cardiomediastinal silhouette is within normal limits. No displaced fracture. IMPRESSION: No evidence of acute cardiopulmonary disease. Electronically Signed   By: FMargaretha SheffieldM.D.   On: 11/21/2021 10:55   MR BRAIN WO CONTRAST  Result Date: 11/21/2021 CLINICAL DATA:  Neuro deficit, acute, stroke suspected; carotid artery stenosis screening, risk factors EXAM: MRI HEAD  WITHOUT CONTRAST MRA HEAD WITHOUT CONTRAST MRA NECK WITHOUT AND WITH CONTRAST TECHNIQUE: Multiplanar, multi-echo pulse sequences of the brain and surrounding structures were acquired without intravenous contrast. Angiographic images of the Circle of Willis were acquired using MRA technique without intravenous contrast. Angiographic images of the neck were acquired using MRA technique without and with intravenous contrast. Carotid stenosis measurements (when applicable) are obtained utilizing NASCET criteria, using the distal internal carotid diameter as the denominator. CONTRAST:  8106mGADAVIST GADOBUTROL 1 MMOL/ML IV SOLN COMPARISON:  MRI head October 2021 FINDINGS: MRI HEAD Brain: There is no acute infarction or intracranial hemorrhage. There is no intracranial mass, mass effect, or edema. Ventricles  and sulci are normal in size and configuration. Small focus of T2 hyperintensity in the left frontal subcortical white matter likely reflects nonspecific gliosis/demyelination of doubtful significance. There is no hydrocephalus or extra-axial fluid collection. Vascular: Major vessel flow voids at the skull base are preserved. Skull and upper cervical spine: Normal marrow signal is preserved. Sinuses/Orbits: Paranasal sinuses are aerated. Orbits are unremarkable. Other: Sella is unremarkable.  Mastoid air cells are clear. MRA HEAD Degraded by motion. Intracranial internal carotid arteries are patent with atherosclerotic irregularity. Proximal middle and anterior cerebral arteries are patent. Intracranial vertebral arteries, basilar artery, proximal posterior cerebral arteries are patent. Right posterior communicating artery is identified. MRA NECK Great vessel origins are patent. Common, internal, and external carotid arteries are patent. Plaque at the left greater than right ICA origins with minimal stenosis. Extracranial codominant vertebral arteries are patent without stenosis. IMPRESSION: No acute infarction,  hemorrhage, or mass. No large vessel occlusion or hemodynamically significant stenosis. Electronically Signed   By: Macy Mis M.D.   On: 11/21/2021 10:18   MR ANGIO HEAD WO CONTRAST  Result Date: 11/21/2021 CLINICAL DATA:  Neuro deficit, acute, stroke suspected; carotid artery stenosis screening, risk factors EXAM: MRI HEAD WITHOUT CONTRAST MRA HEAD WITHOUT CONTRAST MRA NECK WITHOUT AND WITH CONTRAST TECHNIQUE: Multiplanar, multi-echo pulse sequences of the brain and surrounding structures were acquired without intravenous contrast. Angiographic images of the Circle of Willis were acquired using MRA technique without intravenous contrast. Angiographic images of the neck were acquired using MRA technique without and with intravenous contrast. Carotid stenosis measurements (when applicable) are obtained utilizing NASCET criteria, using the distal internal carotid diameter as the denominator. CONTRAST:  72m GADAVIST GADOBUTROL 1 MMOL/ML IV SOLN COMPARISON:  MRI head October 2021 FINDINGS: MRI HEAD Brain: There is no acute infarction or intracranial hemorrhage. There is no intracranial mass, mass effect, or edema. Ventricles and sulci are normal in size and configuration. Small focus of T2 hyperintensity in the left frontal subcortical white matter likely reflects nonspecific gliosis/demyelination of doubtful significance. There is no hydrocephalus or extra-axial fluid collection. Vascular: Major vessel flow voids at the skull base are preserved. Skull and upper cervical spine: Normal marrow signal is preserved. Sinuses/Orbits: Paranasal sinuses are aerated. Orbits are unremarkable. Other: Sella is unremarkable.  Mastoid air cells are clear. MRA HEAD Degraded by motion. Intracranial internal carotid arteries are patent with atherosclerotic irregularity. Proximal middle and anterior cerebral arteries are patent. Intracranial vertebral arteries, basilar artery, proximal posterior cerebral arteries are patent.  Right posterior communicating artery is identified. MRA NECK Great vessel origins are patent. Common, internal, and external carotid arteries are patent. Plaque at the left greater than right ICA origins with minimal stenosis. Extracranial codominant vertebral arteries are patent without stenosis. IMPRESSION: No acute infarction, hemorrhage, or mass. No large vessel occlusion or hemodynamically significant stenosis. Electronically Signed   By: PMacy MisM.D.   On: 11/21/2021 10:18   MR ANGIO NECK W WO CONTRAST  Result Date: 11/21/2021 CLINICAL DATA:  Neuro deficit, acute, stroke suspected; carotid artery stenosis screening, risk factors EXAM: MRI HEAD WITHOUT CONTRAST MRA HEAD WITHOUT CONTRAST MRA NECK WITHOUT AND WITH CONTRAST TECHNIQUE: Multiplanar, multi-echo pulse sequences of the brain and surrounding structures were acquired without intravenous contrast. Angiographic images of the Circle of Willis were acquired using MRA technique without intravenous contrast. Angiographic images of the neck were acquired using MRA technique without and with intravenous contrast. Carotid stenosis measurements (when applicable) are obtained utilizing NASCET criteria, using the distal internal carotid  diameter as the denominator. CONTRAST:  69m GADAVIST GADOBUTROL 1 MMOL/ML IV SOLN COMPARISON:  MRI head October 2021 FINDINGS: MRI HEAD Brain: There is no acute infarction or intracranial hemorrhage. There is no intracranial mass, mass effect, or edema. Ventricles and sulci are normal in size and configuration. Small focus of T2 hyperintensity in the left frontal subcortical white matter likely reflects nonspecific gliosis/demyelination of doubtful significance. There is no hydrocephalus or extra-axial fluid collection. Vascular: Major vessel flow voids at the skull base are preserved. Skull and upper cervical spine: Normal marrow signal is preserved. Sinuses/Orbits: Paranasal sinuses are aerated. Orbits are unremarkable.  Other: Sella is unremarkable.  Mastoid air cells are clear. MRA HEAD Degraded by motion. Intracranial internal carotid arteries are patent with atherosclerotic irregularity. Proximal middle and anterior cerebral arteries are patent. Intracranial vertebral arteries, basilar artery, proximal posterior cerebral arteries are patent. Right posterior communicating artery is identified. MRA NECK Great vessel origins are patent. Common, internal, and external carotid arteries are patent. Plaque at the left greater than right ICA origins with minimal stenosis. Extracranial codominant vertebral arteries are patent without stenosis. IMPRESSION: No acute infarction, hemorrhage, or mass. No large vessel occlusion or hemodynamically significant stenosis. Electronically Signed   By: PMacy MisM.D.   On: 11/21/2021 10:18   CT HEAD CODE STROKE WO CONTRAST  Result Date: 11/21/2021 CLINICAL DATA:  Code stroke. 66year old female. Altered mental status. EXAM: CT HEAD WITHOUT CONTRAST TECHNIQUE: Contiguous axial images were obtained from the base of the skull through the vertex without intravenous contrast. RADIATION DOSE REDUCTION: This exam was performed according to the departmental dose-optimization program which includes automated exposure control, adjustment of the mA and/or kV according to patient size and/or use of iterative reconstruction technique. COMPARISON:  Brain MRI 03/28/2020.  Head CT 10/27/2021. FINDINGS: Brain: No midline shift, ventriculomegaly, mass effect, evidence of mass lesion, intracranial hemorrhage or evidence of cortically based acute infarction. Gray-white matter differentiation is within normal limits throughout the brain. Vascular: No suspicious intracranial vascular hyperdensity. Skull: Chronic traumatic and/or postoperative nasal bone changes. No acute osseous abnormality identified. Sinuses/Orbits: Clear. Other: Visualized orbits and scalp soft tissues are within normal limits. ASPECTS  ([facility] Institute[facility]roke Program Early CT Score) Total score (0-10 with 10 being normal): 10 IMPRESSION: Stable and normal for age noncontrast CT appearance of the brain. Study discussed by telephone with Dr. MTeressa Loweron 11/21/2021 at 09:14 . Electronically Signed   By: HGenevie AnnM.D.   On: 11/21/2021 09:14   VAS [place]ABI WITH/WO TBI  Result Date: 11/07/2021  LOWER EXTREMITY DOPPLER STUDY Patient Name:  SGABRYELLA Deleon Date of Exam:   11/07/2021 Medical Rec #: [id number]         Accession #:    [id number] of Birth: 7January 02, 1957         Patient Gender: F Patient Age:   [age]years [facility] Procedure:      VAS [place]I WITH/WO TBI Referring Phys: TODD EARLY --------------------------------------------------------------------------------  Indications: Abnormal insurance screening. High Risk Factors: Diabetes, past history of smoking.  Performing Technologist: HRalene CorkRVT  Examination Guidelines: A complete evaluation includes at minimum, Doppler waveform signals and systolic blood pressure reading at the level of bilateral brachial, anterior tibial, and posterior tibial arteries, when vessel segments are accessible. Bilateral testing is considered an integral part of a complete examination. Photoelectric Plethysmograph (PPG) waveforms and toe systolic pressure readings are included as required and additional duplex testing as  needed. Limited examinations for reoccurring indications may be performed as noted.  ABI Findings: +---------+------------------+-----+---------+--------+ Right    Rt Pressure (mmHg)IndexWaveform Comment  +---------+------------------+-----+---------+--------+ Brachial 125                                      +---------+------------------+-----+---------+--------+ PTA      156               1.20 triphasic         +---------+------------------+-----+---------+--------+ DP       165               1.27 triphasic          +---------+------------------+-----+---------+--------+ Great Toe119               0.92                   +---------+------------------+-----+---------+--------+ +---------+------------------+-----+---------+-------+ Left     Lt Pressure (mmHg)IndexWaveform Comment +---------+------------------+-----+---------+-------+ Brachial 130                                     +---------+------------------+-----+---------+-------+ PTA      151               1.16 triphasic        +---------+------------------+-----+---------+-------+ DP       150               1.15 triphasic        +---------+------------------+-----+---------+-------+ Great Toe134               1.03                  +---------+------------------+-----+---------+-------+ +-------+-----------+-----------+------------+------------+ ABI/TBIToday's ABIToday's TBIPrevious ABIPrevious TBI +-------+-----------+-----------+------------+------------+ Right  1.27       0.92       0.94                     +-------+-----------+-----------+------------+------------+ Left   1.16       1.03       0.2                      +-------+-----------+-----------+------------+------------+ Previous ABI Insurance screening on 4/24/233 Cannot rule out some component of medial cacification.  Summary: Right: Resting right ankle-brachial index is within normal range. No evidence of significant right lower extremity arterial disease. The right toe-brachial index is normal. Left: Resting left ankle-brachial index is within normal range. No evidence of significant left lower extremity arterial disease. The left toe-brachial index is normal. *See table(s) above for measurements and observations.  Electronically signed by Curt Jews MD on 11/07/2021 at 4:12:30 PM.    Final    DG HIP UNILAT WITH PELVIS 2-3 VIEWS RIGHT  Result Date: 10/30/2021 CLINICAL DATA:  Fall EXAM: DG HIP (WITH OR WITHOUT PELVIS) 2-3V RIGHT COMPARISON:  None Available.  FINDINGS: There is no evidence of hip fracture or dislocation. There is no evidence of arthropathy or other focal bone abnormality. IMPRESSION: Negative. Electronically Signed   By: Franchot Gallo M.D.   On: 10/30/2021 12:37   US Venous Img Lower Unilateral Right (DVT)  Result Date: 10/30/2021 CLINICAL DATA:  Right leg pain EXAM: Right LOWER EXTREMITY VENOUS DOPPLER ULTRASOUND TECHNIQUE: Gray-scale sonography with compression, as well as color and duplex ultrasound, were performed to evaluate the  deep venous system(s) from the level of the common femoral vein through the popliteal and proximal calf veins. COMPARISON:  None Available. FINDINGS: VENOUS Normal compressibility of the common femoral, superficial femoral, and popliteal veins, as well as the visualized calf veins. Visualized portions of profunda femoral vein and great saphenous vein unremarkable. No filling defects to suggest DVT on grayscale or color Doppler imaging. Doppler waveforms show normal direction of venous flow, normal respiratory plasticity and response to augmentation. Limited views of the contralateral common femoral vein are unremarkable. OTHER None. Limitations: none IMPRESSION: Negative. Electronically Signed   By: Franchot Gallo M.D.   On: 10/30/2021 12:36   DG Shoulder Right  Result Date: 10/30/2021 CLINICAL DATA:  Fall EXAM: RIGHT SHOULDER - 2+ VIEW COMPARISON:  10/26/2021 FINDINGS: There is no evidence of fracture or dislocation. There is no evidence of arthropathy or other focal bone abnormality. Soft tissues are unremarkable. IMPRESSION: Negative. Electronically Signed   By: Franchot Gallo M.D.   On: 10/30/2021 12:34   CT LUMBAR SPINE WO CONTRAST  Result Date: 10/27/2021 CLINICAL DATA:  66 year old female with low back pain. Extremity weakness. Multiple recent falls. EXAM: CT LUMBAR SPINE WITHOUT CONTRAST TECHNIQUE: Multidetector CT imaging of the lumbar spine was performed without intravenous contrast administration.  Multiplanar CT image reconstructions were also generated. RADIATION DOSE REDUCTION: This exam was performed according to the departmental dose-optimization program which includes automated exposure control, adjustment of the mA and/or kV according to patient size and/or use of iterative reconstruction technique. COMPARISON:  Lumbar MRI 01/01/2019. CT Chest, Abdomen, and Pelvis 10/26/2021. FINDINGS: Segmentation: Normal, the same numbering system used on the 2020 MRI. Alignment: Mildly improved lumbar lordosis from the previous MRI, less straightening. Mild chronic degenerative appearing retrolisthesis of L5 on S1 is stable. Vertebrae: Diffuse osteopenia. Visible lower thoracic levels appear intact. 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 Lumbar levels appear intact. Visible sacrum and SI joints appear intact. No acute osseous abnormality identified. Paraspinal and other soft tissues: Respiratory motion at the lung bases. Aortoiliac calcified atherosclerosis. Normal caliber abdominal aorta. Vicarious contrast excretion into the gallbladder. Negative visible other noncontrast abdominal and pelvic viscera. Negative lumbar paraspinal soft tissues. Disc levels: Capacious spinal canal. Lumbar spine degeneration largely stable from the 2020 MRI, notable for vacuum disc, foraminal disc osteophyte complex and mild facet hypertrophy in association with chronic L5-S1 retrolisthesis. However, there is a new left paracentral focus of gas in the ventral epidural space series 4, image 123 near the descending left S1 nerve root in the lateral recess. But no spinal stenosis. Mild to moderate left and moderate to severe right L5 neural foraminal stenosis appears chronic and stable. IMPRESSION: 1. Osteopenia. No acute osseous abnormality in the lumbar spine. 2. 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 However, chronic disc degeneration at L5-S1 in conjunction with retrolisthesis does appear progressed with evidence of a small disc herniation near the descending left S1 nerve roots in the lateral recess. Query left S1 radiculitis. 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 Electronically Signed   By: Genevie Ann M.D.   On: 10/27/2021 09:41   CT HEAD WO CONTRAST (5MM)  Result Date: 10/27/2021 CLINICAL DATA:  65 year old female with frequent falls. Weakness. EXAM: CT HEAD WITHOUT CONTRAST TECHNIQUE: Contiguous axial images were obtained from the base of the skull through the vertex without intravenous contrast. RADIATION DOSE REDUCTION:  This exam was performed according to the departmental dose-optimization program which includes automated exposure control, adjustment of the mA and/or kV according to patient size and/or use of iterative reconstruction technique. COMPARISON:  Brain MRI 03/28/2020. Head CT 05/29/2017. FINDINGS: Brain: Cerebral volume remains normal for age. No midline shift, ventriculomegaly, mass effect, evidence of mass lesion, intracranial hemorrhage or evidence of cortically based acute infarction. Gray-white matter differentiation is stable and normal for age. No encephalomalacia identified. Vascular: Calcified atherosclerosis at the skull base. Skull: Chronic osteopenia.  No acute osseous abnormality identified. Sinuses/Orbits: Tympanic cavity and Visualized paranasal sinuses and mastoids are stable and well aerated. Other: No acute orbit or scalp soft tissue finding. IMPRESSION: Stable and normal for age non contrast CT appearance of the brain. No recent traumatic injury identified. Electronically Signed   By: Genevie Ann M.D.   On: 10/27/2021 05:05   CT CHEST ABDOMEN PELVIS W CONTRAST  Result Date: 10/26/2021 CLINICAL DATA:  Lower abdominal pain with weakness x2 days. Fall 5 times in the past 2 days. EXAM: CT CHEST, ABDOMEN, AND PELVIS WITH CONTRAST  TECHNIQUE: Multidetector CT imaging of the chest, abdomen and pelvis was performed following the standard protocol during bolus administration of intravenous contrast. RADIATION DOSE REDUCTION: This exam was performed according to the departmental dose-optimization program which includes automated exposure control, adjustment of the mA and/or kV according to patient size and/or use of iterative reconstruction technique. CONTRAST:  179m OMNIPAQUE IOHEXOL 300 MG/ML  SOLN COMPARISON:  CT April 30, 2019 FINDINGS: CT CHEST FINDINGS Cardiovascular: Aortic atherosclerosis without aneurysmal dilation of the thoracic aorta. No central pulmonary embolus on this nondedicated study. Normal size heart. No significant pericardial effusion/thickening. Coronary artery calcifications. Mediastinum/Nodes: No suspicious thyroid nodule. No supraclavicular adenopathy. No mediastinal fluid or hematoma. No pathologically enlarged mediastinal, hilar or axillary lymph nodes. Lungs/Pleura: Mild biapical pleuroparenchymal scarring. Hypoventilatory change in the lung bases. Atelectasis in the right middle lobe and lingula. No evidence of parenchymal contusion or laceration. No pleural effusion. No pneumothorax. Musculoskeletal: Multilevel degenerative changes spine. No acute osseous abnormality. CT ABDOMEN PELVIS FINDINGS Hepatobiliary: Mild hepatic steatosis with focal fatty sparing along the gallbladder fossa. No suspicious hepatic lesion. No hepatic injury or peripancreatic hematoma. Gallbladder is unremarkable. No biliary ductal dilation. Pancreas: No pancreatic ductal dilation, acute inflammation or evidence of laceration. Spleen: No splenic injury or perisplenic hematoma. Adrenals/Urinary Tract: No adrenal hemorrhage or renal injury identified. No hydronephrosis. Kidneys demonstrate symmetric enhancement and excretion of contrast material. Bladder is significantly distended extending above the sacral promontory. Stomach/Bowel: No  radiopaque enteric contrast material was administered. Stomach is minimally distended limiting evaluation. There is no pathologic dilation of small or large bowel. Terminal ileum appears normal. Normal appendix. Large volume of formed stool throughout the colon suggestive of constipation. No evidence of acute bowel inflammation. Vascular/Lymphatic: Aortic atherosclerosis without aneurysmal dilation. No pathologically enlarged abdominal or pelvic lymph nodes. Reproductive: Status post hysterectomy. No adnexal masses. Other: No significant abdominopelvic free fluid. No abdominal wall herniation. Musculoskeletal: Multilevel degenerative changes spine. No acute osseous abnormality. IMPRESSION: 1. No evidence of acute traumatic injury in the chest, abdomen, or pelvis. 2. Large volume of formed stool throughout the colon suggestive of constipation. 3. Significantly distended urinary bladder extending above the sacral promontory. Correlate for urinary retention. 4. Hepatic steatosis. 5.  Aortic Atherosclerosis (ICD10-I70.0). Electronically Signed   By: JDahlia BailiffM.D.   On: 10/26/2021 11:51   DG Shoulder Right  Result Date: 10/26/2021 CLINICAL DATA:  Fall EXAM: RIGHT SHOULDER -  2+ VIEW COMPARISON:  None Available. FINDINGS: There is no acute fracture or dislocation. Glenohumeral and acromioclavicular alignment is maintained. The joint spaces are preserved. The soft tissues are unremarkable. IMPRESSION: No acute fracture or dislocation. Electronically Signed   By: Valetta Mole M.D.   On: 10/26/2021 09:52   DG Chest 2 View  Result Date: 10/26/2021 CLINICAL DATA:  Pain after fall. EXAM: CHEST - 2 VIEW COMPARISON:  None Available. FINDINGS: The heart size and mediastinal contours are within normal limits. Both lungs are clear. Osteopenia and mild thoracic spondylosis. No appreciable fracture. IMPRESSION: No active cardiopulmonary disease. No appreciable fracture. Thoracic spondylosis. Electronically Signed   By:  Keane Police D.O.   On: 10/26/2021 09:51     Discharge Exam: Vitals:   11/23/21 0912 11/23/21 1107  BP: (!) 127/50   Pulse:    Resp:    Temp:    SpO2:  (!) 89%   Vitals:   11/22/21 2000 11/23/21 0521 11/23/21 0912 11/23/21 1107  BP: (!) 122/58 (!) 105/58 (!) 127/50   Pulse: 60 77    Resp: 14 15    Temp: 98.4 F (36.9 C) 97.9 F (36.6 C)    TempSrc: Oral     SpO2: 91% 91%  (!) 89%  Weight:      Height:        General: Pt is alert, awake, not in acute distress Cardiovascular: RRR, S1/S2 +, no rubs, no gallops Respiratory: CTA bilaterally, no wheezing, no rhonchi Abdominal: Soft, NT, ND, bowel sounds + Extremities: no edema, no cyanosis    The results of significant diagnostics from this hospitalization (including imaging, microbiology, ancillary and laboratory) are listed below for reference.     Microbiology: Recent Results (from the past 240 hour(s))  Resp Panel by RT-PCR (Flu A&B, Covid) Anterior Nasal Swab     Status: None   Collection Time: 11/21/21  8:56 AM   Specimen: Anterior Nasal Swab  Result Value Ref Range Status   SARS Coronavirus 2 by RT PCR NEGATIVE NEGATIVE Final    Comment: (NOTE) SARS-CoV-2 target nucleic acids are NOT DETECTED.  The SARS-CoV-2 RNA is generally detectable in upper respiratory specimens during the acute phase of infection. The lowest concentration of SARS-CoV-2 viral copies this assay can detect is 138 copies/mL. A negative result does not preclude SARS-Cov-2 infection and should not be used as the sole basis for treatment or other patient management decisions. A negative result may occur with  improper specimen collection/handling, submission of specimen other than nasopharyngeal swab, presence of viral mutation(s) within the areas targeted by this assay, and inadequate number of viral copies(<138 copies/mL). A negative result must be combined with clinical observations, patient history, and epidemiological information. The  expected result is Negative.  Fact Sheet for Patients:  EntrepreneurPulse.com.au  Fact Sheet for Healthcare Providers:  IncredibleEmployment.be  This test is no t yet approved or cleared by the [place] FDA and  has been authorized for detection and/or diagnosis of SARS-CoV-2 by FDA under an Emergency Use Authorization (EUA). This EUA will remain  in effect (meaning this test can be used) for the duration of the COVID-19 declaration under Section 564(b)(1) of the Act, 21 U.CynthiaC.section 360bbb-3(b)(1), unless the authorization is terminated  or revoked sooner.       Influenza A by PCR NEGATIVE NEGATIVE Final   Influenza B by PCR NEGATIVE NEGATIVE Final    Comment: (NOTE) The Xpert Xpress SARS-CoV-2/FLU/RSV plus assay is intended as an aid in the diagnosis  of influenza from Nasopharyngeal swab specimens and should not be used as a sole basis for treatment. Nasal washings and aspirates are unacceptable for Xpert Xpress SARS-CoV-2/FLU/RSV testing.  Fact Sheet for Patients: EntrepreneurPulse.com.au  Fact Sheet for Healthcare Providers: IncredibleEmployment.be  This test is not yet approved or cleared by the [place] FDA and has been authorized for detection and/or diagnosis of SARS-CoV-2 by FDA under an Emergency Use Authorization (EUA). This EUA will remain in effect (meaning this test can be used) for the duration of the COVID-19 declaration under Section 564(b)(1) of the Act, 21 U.CynthiaC. section 360bbb-3(b)(1), unless the authorization is terminated or revoked.  Performed at [facility], [address], [place], [place] [address]      Labs: BNP (last 3 results) No results for input(s): "BNP" in the last 8760 hours. Basic Metabolic Panel: Recent Labs  Lab 11/21/21 0911 11/21/21 1104 11/22/21 0530  NA 137 138 136  K 4.9 5.1 4.3  CL 102 103 101  CO2 28  --  29  GLUCOSE 264* 236* 185*   BUN '14 17 15  '$ CREATININE 0.77 0.70 0.83  CALCIUM 10.0  --  9.0  MG  --   --  2.1   Liver Function Tests: Recent Labs  Lab 11/21/21 0911  AST 20  ALT 13  ALKPHOS 85  BILITOT 1.0  PROT 7.3  ALBUMIN 4.3   No results for input(s): "LIPASE", "AMYLASE" in the last 168 hours. Recent Labs  Lab 11/21/21 1244  AMMONIA <10   CBC: Recent Labs  Lab 11/21/21 0911 11/21/21 1104 11/22/21 0530  WBC 7.6  --  9.0  NEUTROABS 4.3  --   --   HGB 16.2* 16.3* 14.3  HCT 48.5* 48.0* 45.3  MCV 96.2  --  98.9  PLT 179  --  190   Cardiac Enzymes: No results for input(s): "CKTOTAL", "CKMB", "CKMBINDEX", "TROPONINI" in the last 168 hours. BNP: Invalid input(s): "POCBNP" CBG: Recent Labs  Lab 11/22/21 1117 11/22/21 1733 11/22/21 2051 11/23/21 0800 11/23/21 1134  GLUCAP 227* 191* 291* 202* 202*   D-Dimer No results for input(s): "DDIMER" in the last 72 hours. Hgb A1c No results for input(s): "HGBA1C" in the last 72 hours. Lipid Profile No results for input(s): "CHOL", "HDL", "LDLCALC", "TRIG", "CHOLHDL", "LDLDIRECT" in the last 72 hours. Thyroid function studies No results for input(s): "TSH", "T4TOTAL", "T3FREE", "THYROIDAB" in the last 72 hours.  Invalid input(s): "FREET3" Anemia work up No results for input(s): "VITAMINB12", "FOLATE", "FERRITIN", "TIBC", "IRON", "RETICCTPCT" in the last 72 hours. Urinalysis    Component Value Date/Time   COLORURINE STRAW (A) 11/21/2021 0856   APPEARANCEUR CLEAR 11/21/2021 0856   APPEARANCEUR Clear 09/13/2021 1035   LABSPEC 1.009 11/21/2021 0856   PHURINE 6.0 11/21/2021 0856   GLUCOSEU >=500 (A) 11/21/2021 0856   HGBUR NEGATIVE 11/21/2021 0856   BILIRUBINUR NEGATIVE 11/21/2021 0856   BILIRUBINUR Negative 09/13/2021 1035   KETONESUR NEGATIVE 11/21/2021 0856   PROTEINUR NEGATIVE 11/21/2021 0856   UROBILINOGEN 0.2 11/26/2014 0659   NITRITE NEGATIVE 11/21/2021 0856   LEUKOCYTESUR NEGATIVE 11/21/2021 0856   Sepsis Labs Recent Labs  Lab  11/21/21 0911 11/22/21 0530  WBC 7.6 9.0   Microbiology Recent Results (from the past 240 hour(s))  Resp Panel by RT-PCR (Flu A&B, Covid) Anterior Nasal Swab     Status: None   Collection Time: 11/21/21  8:56 AM   Specimen: Anterior Nasal Swab  Result Value Ref Range Status   SARS Coronavirus 2 by RT PCR  NEGATIVE NEGATIVE Final    Comment: (NOTE) SARS-CoV-2 target nucleic acids are NOT DETECTED.  The SARS-CoV-2 RNA is generally detectable in upper respiratory specimens during the acute phase of infection. The lowest concentration of SARS-CoV-2 viral copies this assay can detect is 138 copies/mL. A negative result does not preclude SARS-Cov-2 infection and should not be used as the sole basis for treatment or other patient management decisions. A negative result may occur with  improper specimen collection/handling, submission of specimen other than nasopharyngeal swab, presence of viral mutation(s) within the areas targeted by this assay, and inadequate number of viral copies(<138 copies/mL). A negative result must be combined with clinical observations, patient history, and epidemiological information. The expected result is Negative.  Fact Sheet for Patients:  EntrepreneurPulse.com.au  Fact Sheet for Healthcare Providers:  IncredibleEmployment.be  This test is no t yet approved or cleared by the [place] FDA and  has been authorized for detection and/or diagnosis of SARS-CoV-2 by FDA under an Emergency Use Authorization (EUA). This EUA will remain  in effect (meaning this test can be used) for the duration of the COVID-19 declaration under Section 564(b)(1) of the Act, 21 U.CynthiaC.section 360bbb-3(b)(1), unless the authorization is terminated  or revoked sooner.       Influenza A by PCR NEGATIVE NEGATIVE Final   Influenza B by PCR NEGATIVE NEGATIVE Final    Comment: (NOTE) The Xpert Xpress SARS-CoV-2/FLU/RSV plus assay is intended  as an aid in the diagnosis of influenza from Nasopharyngeal swab specimens and should not be used as a sole basis for treatment. Nasal washings and aspirates are unacceptable for Xpert Xpress SARS-CoV-2/FLU/RSV testing.  Fact Sheet for Patients: EntrepreneurPulse.com.au  Fact Sheet for Healthcare Providers: IncredibleEmployment.be  This test is not yet approved or cleared by the [place] FDA and has been authorized for detection and/or diagnosis of SARS-CoV-2 by FDA under an Emergency Use Authorization (EUA). This EUA will remain in effect (meaning this test can be used) for the duration of the COVID-19 declaration under Section 564(b)(1) of the Act, 21 U.CynthiaC. section 360bbb-3(b)(1), unless the authorization is terminated or revoked.  Performed at [facility], [address], [place], [place] [address]      Time coordinating discharge: 35 minutes  SIGNED:   Rodena Goldmann, DO [facility] 11/23/2021, 12:15 PM  If 7PM-7AM, please contact night-coverage [email or web address]

## 2021-11-23 NOTE — TOC Transition Note (Signed)
Transition of Care [facility]) - CM/SW Discharge Note   Patient Details  Name: Cynthia Deleon MRN: [id number] Date of Birth: 08-19-1955  [facility] CM/SW Contact:  [name], [place] Phone Number: 11/23/2021, 12:01 PM   Clinical Narrative:    CSW spoke with pt multiple times this morning about bed offers. Pt agreeable to bed offer at [facility]. CSW updated that insurance Josem Kaufmann has been approved. CSW spoke to Debbie at [facility] who states they can admit pt for SNF today. CSW updated RN and MD of this. CSW provided RN with numbers for report and room. CSW to call for transport when pt is ready. TOC signing off.   Final next level of care: Skilled Nursing Facility Barriers to Discharge: Barriers Resolved   Patient Goals and CMS Choice Patient states their goals for this hospitalization and ongoing recovery are:: go to SNF CMS Medicare.gov Compare Post Acute Care list provided to:: Patient Choice offered to / list presented to : Patient  Discharge Placement              Patient chooses bed at: [facility] Patient to be transferred to facility by: [facility]      Discharge Plan and Services     Post Acute Care Choice: [place]                               Social Determinants of Health (SDOH) Interventions     Readmission Risk Interventions     No data to display

## 2021-11-26 DIAGNOSIS — G894 Chronic pain syndrome: Secondary | ICD-10-CM | POA: Diagnosis not present

## 2021-11-26 DIAGNOSIS — G9341 Metabolic encephalopathy: Secondary | ICD-10-CM | POA: Diagnosis not present

## 2021-11-26 DIAGNOSIS — M6281 Muscle weakness (generalized): Secondary | ICD-10-CM | POA: Diagnosis not present

## 2021-11-26 DIAGNOSIS — I2510 Atherosclerotic heart disease of native coronary artery without angina pectoris: Secondary | ICD-10-CM | POA: Diagnosis not present

## 2021-11-26 DIAGNOSIS — J449 Chronic obstructive pulmonary disease, unspecified: Secondary | ICD-10-CM | POA: Diagnosis not present

## 2021-11-26 DIAGNOSIS — R296 Repeated falls: Secondary | ICD-10-CM | POA: Diagnosis not present

## 2021-11-27 ENCOUNTER — Ambulatory Visit: Payer: Medicare Other | Admitting: Family Medicine

## 2021-11-27 LAB — GLUCOSE, CAPILLARY
Glucose-Capillary: 183 mg/dL — ABNORMAL HIGH (ref 70–99)
Glucose-Capillary: 162 mg/dL — ABNORMAL HIGH (ref 70–99)

## 2021-11-28 DIAGNOSIS — G894 Chronic pain syndrome: Secondary | ICD-10-CM | POA: Diagnosis not present

## 2021-11-28 DIAGNOSIS — R296 Repeated falls: Secondary | ICD-10-CM | POA: Diagnosis not present

## 2021-11-28 DIAGNOSIS — I2510 Atherosclerotic heart disease of native coronary artery without angina pectoris: Secondary | ICD-10-CM | POA: Diagnosis not present

## 2021-11-28 DIAGNOSIS — M6281 Muscle weakness (generalized): Secondary | ICD-10-CM | POA: Diagnosis not present

## 2021-11-28 DIAGNOSIS — J449 Chronic obstructive pulmonary disease, unspecified: Secondary | ICD-10-CM | POA: Diagnosis not present

## 2021-11-28 DIAGNOSIS — G9341 Metabolic encephalopathy: Secondary | ICD-10-CM | POA: Diagnosis not present

## 2021-12-06 DIAGNOSIS — E1165 Type 2 diabetes mellitus with hyperglycemia: Secondary | ICD-10-CM | POA: Diagnosis not present

## 2021-12-07 ENCOUNTER — Encounter: Payer: Self-pay | Admitting: Family Medicine

## 2021-12-07 ENCOUNTER — Ambulatory Visit (INDEPENDENT_AMBULATORY_CARE_PROVIDER_SITE_OTHER): Payer: Medicare Other | Admitting: Family Medicine

## 2021-12-07 VITALS — BP 125/67 | HR 97 | Temp 98.1°F | Ht 69.0 in | Wt 180.4 lb

## 2021-12-07 DIAGNOSIS — N958 Other specified menopausal and perimenopausal disorders: Secondary | ICD-10-CM

## 2021-12-07 DIAGNOSIS — Z8701 Personal history of pneumonia (recurrent): Secondary | ICD-10-CM

## 2021-12-07 DIAGNOSIS — R300 Dysuria: Secondary | ICD-10-CM

## 2021-12-07 DIAGNOSIS — Z09 Encounter for follow-up examination after completed treatment for conditions other than malignant neoplasm: Secondary | ICD-10-CM

## 2021-12-07 DIAGNOSIS — G9341 Metabolic encephalopathy: Secondary | ICD-10-CM

## 2021-12-07 LAB — URINALYSIS, ROUTINE W REFLEX MICROSCOPIC
Bilirubin, UA: NEGATIVE
Ketones, UA: NEGATIVE
Leukocytes,UA: NEGATIVE
Nitrite, UA: NEGATIVE
Protein,UA: NEGATIVE
RBC, UA: NEGATIVE
Specific Gravity, UA: 1.015 (ref 1.005–1.030)
Urobilinogen, Ur: 0.2 mg/dL (ref 0.2–1.0)
pH, UA: 6 (ref 5.0–7.5)

## 2021-12-07 MED ORDER — ESTRADIOL 10 MCG VA TABS: ORAL_TABLET | VAGINAL | 0 refills | Status: AC

## 2021-12-07 NOTE — Progress Notes (Unsigned)
Assessment & Plan:  1. Acute metabolic encephalopathy Resolved.  2. Hospital discharge follow-up  3. History of recent pneumonia Resolved.  4. Dysuria Related to GSM addressed below. - Urinalysis, Routine w reflex microscopic - Urine Culture  5. Genitourinary syndrome of menopause Starting Vagifem.  - Estradiol (VAGIFEM) 10 MCG TABS vaginal tablet; Place 1 tablet (10 mcg total) vaginally daily for 14 days, THEN 1 tablet (10 mcg total) 2 (two) times a week for 14 days.  Dispense: 20 tablet; Refill: 0   Follow up plan: Return in about 6 weeks (around 01/18/2022) for follow-up of chronic medication conditions.  [name], MSN, APRN, FNP-C [facility]  Subjective:   [name], female    DOB: 24-May-1956, 66 y.o.   MRN: [id number]  HPI: Cynthia Deleon is a 66 y.o. female presenting on 12/07/2021 for Hospitalization Follow-up (Acute metabolic encephalopathy- AP) and Dysuria (X 5 days)  Patient was hospitalized at [facility] 11/21/2021-11/23/2021 due to acute metabolic encephalopathy likely due to polypharmacy. She was also diagnosed with pneumonia and treated with Omnicef. She was discharged to SNF due to gait disturbance and weakness (we do not have these records). She was given a Valium taper which she did not complete. States she stopped the Belsomra and has been feeling much better. She no longer feels crazy and dizzy when she gets up in the middle of the night. She reports she completed physical therapy. She does still have a cough from her recent pneumonia for which she is taking Robitussin-DM.  Patient also reports burning with urination for the past five days. She admits she is very dry vaginally and sex is extremely painful.    ROS: Negative unless specifically indicated above in HPI.   Relevant past medical history reviewed and updated as indicated.   Allergies and medications reviewed and updated.   Current Outpatient  Medications:    albuterol (VENTOLIN HFA) 108 (90 Base) MCG/ACT inhaler, Inhale 2 puffs into the lungs every 6 (six) hours as needed. (Patient taking differently: Inhale 2 puffs into the lungs every 6 (six) hours as needed for wheezing or shortness of breath.), Disp: 18 g, Rfl: 2   atorvastatin (LIPITOR) 40 MG tablet, TAKE ONE TABLET BY MOUTH ONCE DAILY. (Patient taking differently: Take 40 mg by mouth daily.), Disp: 30 tablet, Rfl: 0   cholecalciferol (VITAMIN D) 25 MCG tablet, Take 1 tablet (1,000 Units total) by mouth daily., Disp: , Rfl:    Dulaglutide (TRULICITY) 9.38 BO/1.7PZ SOPN, Inject 0.75 mg into the skin once a week. DX: E11.65, Disp: 2 mL, Rfl: 0   DULoxetine (CYMBALTA) 60 MG capsule, Take 1 capsule (60 mg total) by mouth daily., Disp: 30 capsule, Rfl: 11   fluticasone (FLONASE) 50 MCG/ACT nasal spray, USE 2 SPRAYS IN EACH NOSTRIL ONCE DAILY. (Patient taking differently: Place 2 sprays into both nostrils daily.), Disp: 16 g, Rfl: 0   glipiZIDE (GLUCOTROL XL) 5 MG 24 hr tablet, Take 1 tablet (5 mg total) by mouth 2 (two) times daily with a meal., Disp: 60 tablet, Rfl: 5   glucose blood (CONTOUR NEXT TEST) test strip, Test BS 4 times daily Dx E11.9, Disp: 400 strip, Rfl: 3   guaiFENesin-dextromethorphan (ROBITUSSIN DM) 100-10 MG/5ML syrup, Take 5 mLs by mouth every 4 (four) hours as needed for cough., Disp: 118 mL, Rfl: 0   JANUVIA 100 MG tablet, TAKE (1) TABLET BY MOUTH ONCE DAILY. (Patient taking differently: Take 100 mg by mouth daily.), Disp: 30  tablet, Rfl: 0   lamoTRIgine (LAMICTAL) 200 MG tablet, Take 200 mg by mouth at bedtime. For mood disorder., Disp: , Rfl:    lansoprazole (PREVACID) 30 MG capsule, TAKE (1) CAPSULE BY MOUTH ONCE DAILY. (Patient taking differently: Take 30 mg by mouth daily at 12 noon.), Disp: 30 capsule, Rfl: 0   lisinopril (ZESTRIL) 5 MG tablet, TAKE (1) TABLET BY MOUTH ONCE DAILY. (Patient taking differently: Take 5 mg by mouth daily.), Disp: 30 tablet, Rfl: 0    meclizine (ANTIVERT) 25 MG tablet, Take 2 tablets (50 mg total) by mouth daily as needed for dizziness., Disp: 60 tablet, Rfl: 2   montelukast (SINGULAIR) 10 MG tablet, TAKE (1) TABLET BY MOUTH AT BEDTIME., Disp: 90 tablet, Rfl: 1   OneTouch Delica Lancets 70J MISC, Use to test blood sugar twice daily. DX: E11.9, Disp: 100 each, Rfl: 3   oxybutynin (DITROPAN XL) 15 MG 24 hr tablet, Take 1 tablet (15 mg total) by mouth at bedtime., Disp: 30 tablet, Rfl: 0   oxyCODONE (ROXICODONE) 15 MG immediate release tablet, Take 1 tablet (15 mg total) by mouth every 6 (six) hours as needed for pain., Disp: 10 tablet, Rfl: 0   pregabalin (LYRICA) 150 MG capsule, Take 1 capsule (150 mg total) by mouth 3 (three) times daily., Disp: 90 capsule, Rfl: 2   PREMARIN 0.3 MG tablet, TAKE (1) TABLET BY MOUTH ONCE DAILY. (Patient taking differently: Take 0.3 mg by mouth daily.), Disp: 30 tablet, Rfl: 5   promethazine (PHENERGAN) 12.5 MG tablet, Take 1 tablet (12.5 mg total) by mouth every 8 (eight) hours as needed for nausea or vomiting., Disp: 20 tablet, Rfl: 0   valACYclovir (VALTREX) 500 MG tablet, TAKE (1) TABLET BY MOUTH ONCE DAILY AS NEEDED FOR SHINGLES FLARE UP. (Patient taking differently: Take 500 mg by mouth as needed (shingle flare-up).), Disp: 30 tablet, Rfl: 0   vitamin B-12 1000 MCG tablet, Take 1 tablet (1,000 mcg total) by mouth daily., Disp: , Rfl:   Allergies  Allergen Reactions   Chantix [Varenicline] Other (See Comments)    Altered mental status-Per patient was put on allergy list by Dr Lorriane Shire bu patient staes she is not allergic to this and is currently taking it.    Divalproex Sodium Other (See Comments)    Hallucinations  Other reaction(s): Confusion   Other Anaphylaxis   Pseudoeph-Hydrocodone-Gg Nausea Only   Sulfa Antibiotics Anaphylaxis, Nausea Only, Swelling and Other (See Comments)    Swelling of tongue.   Varenicline Tartrate Other (See Comments)    Other reaction(s): Confusion   Ambien  [Zolpidem] Other (See Comments)    Causes sleep walking   Clavulanic Acid Diarrhea   Codeine Nausea And Vomiting and Other (See Comments)   Elemental Sulfur Other (See Comments)    Swelling of tongue   Hydrocod Poli-Chlorphe Poli Er Other (See Comments)    Other reaction(s): Skin itches    Hydrocodone Nausea And Vomiting   Macrobid [Nitrofurantoin] Nausea And Vomiting    Dizziness, nausea, vomiting   Paxil [Paroxetine Hcl] Other (See Comments)    Causes ringing in the ears.    Prednisone Other (See Comments)    Other reaction(s): Unknown    Amoxicillin Rash    Has patient had a PCN reaction causing immediate rash, facial/tongue/throat swelling, SOB or lightheadedness with hypotension: No Has patient had a PCN reaction causing severe rash involving mucus membranes or skin necrosis: No Has patient had a PCN reaction that required hospitalization: No Has patient  had a PCN reaction occurring within the last 10 years: Yes If all of the above answers are "NO", then may proceed with Cephalosporin use.    Farxiga [Dapagliflozin]     RECURRENT YEAST/UTI   Metformin And Related Diarrhea   Tape Rash    Objective:   BP 125/67   Pulse 97   Temp 98.1 F (36.7 C) (Temporal)   Ht '5\' 9"'$  (1.753 m)   Wt 180 lb 6.4 oz (81.8 kg)   SpO2 93%   BMI 26.64 kg/m    Physical Exam Vitals reviewed.  Constitutional:      General: She is not in acute distress.    Appearance: Normal appearance. She is overweight. She is not ill-appearing, toxic-appearing or diaphoretic.  HENT:     Head: Normocephalic and atraumatic.  Eyes:     General: No scleral icterus.       Right eye: No discharge.        Left eye: No discharge.     Conjunctiva/sclera: Conjunctivae normal.  Cardiovascular:     Rate and Rhythm: Normal rate and regular rhythm.     Heart sounds: Normal heart sounds. No murmur heard.    No friction rub. No gallop.  Pulmonary:     Effort: Pulmonary effort is normal. No respiratory distress.      Breath sounds: Normal breath sounds. No stridor. No wheezing, rhonchi or rales.  Musculoskeletal:        General: Normal range of motion.     Cervical back: Normal range of motion.  Skin:    General: Skin is warm and dry.     Capillary Refill: Capillary refill takes less than 2 seconds.  Neurological:     General: No focal deficit present.     Mental Status: She is alert and oriented to person, place, and time. Mental status is at baseline.  Psychiatric:        Mood and Affect: Mood normal.        Behavior: Behavior normal.        Thought Content: Thought content normal.        Judgment: Judgment normal.

## 2021-12-09 LAB — URINE CULTURE: Organism ID, Bacteria: NO GROWTH

## 2021-12-10 ENCOUNTER — Other Ambulatory Visit: Payer: Self-pay

## 2021-12-10 ENCOUNTER — Telehealth: Payer: Self-pay | Admitting: Acute Care

## 2021-12-10 ENCOUNTER — Ambulatory Visit (HOSPITAL_COMMUNITY)
Admission: RE | Admit: 2021-12-10 | Discharge: 2021-12-10 | Disposition: A | Discharge status: Home / Self Care | Payer: Medicare Other | Source: Ambulatory Visit | Attending: Acute Care | Admitting: Acute Care

## 2021-12-10 DIAGNOSIS — Z87891 Personal history of nicotine dependence: Secondary | ICD-10-CM | POA: Diagnosis not present

## 2021-12-10 DIAGNOSIS — Z122 Encounter for screening for malignant neoplasm of respiratory organs: Secondary | ICD-10-CM

## 2021-12-10 NOTE — Telephone Encounter (Signed)
Left message for patient to call to review results of LDCT

## 2021-12-18 ENCOUNTER — Telehealth: Payer: Self-pay | Admitting: Pharmacist

## 2021-12-18 DIAGNOSIS — E11649 Type 2 diabetes mellitus with hypoglycemia without coma: Secondary | ICD-10-CM

## 2021-12-18 MED ORDER — TRULICITY 1.5 MG/0.5ML ~~LOC~~ SOAJ: 1.5000 mg | SUBCUTANEOUS | 4 refills | Status: DC

## 2021-12-18 NOTE — Telephone Encounter (Signed)
Tolerating trulicity 0.'75mg'$  weekly will increase trulicity to 1.'5mg'$  weekly Blood sugars have increased

## 2021-12-19 ENCOUNTER — Encounter: Payer: Self-pay | Admitting: Pulmonary Disease

## 2021-12-19 ENCOUNTER — Ambulatory Visit (INDEPENDENT_AMBULATORY_CARE_PROVIDER_SITE_OTHER): Payer: Medicare Other | Admitting: Pulmonary Disease

## 2021-12-19 VITALS — BP 134/68 | HR 90 | Temp 98.4°F | Ht 69.0 in | Wt 176.2 lb

## 2021-12-19 DIAGNOSIS — I2510 Atherosclerotic heart disease of native coronary artery without angina pectoris: Secondary | ICD-10-CM | POA: Diagnosis not present

## 2021-12-19 DIAGNOSIS — R0609 Other forms of dyspnea: Secondary | ICD-10-CM

## 2021-12-19 DIAGNOSIS — I2584 Coronary atherosclerosis due to calcified coronary lesion: Secondary | ICD-10-CM | POA: Diagnosis not present

## 2021-12-19 DIAGNOSIS — J4550 Severe persistent asthma, uncomplicated: Secondary | ICD-10-CM | POA: Diagnosis not present

## 2021-12-19 DIAGNOSIS — J3089 Other allergic rhinitis: Secondary | ICD-10-CM

## 2021-12-19 DIAGNOSIS — G4733 Obstructive sleep apnea (adult) (pediatric): Secondary | ICD-10-CM | POA: Diagnosis not present

## 2021-12-19 DIAGNOSIS — G4734 Idiopathic sleep related nonobstructive alveolar hypoventilation: Secondary | ICD-10-CM | POA: Diagnosis not present

## 2021-12-19 MED ORDER — TRELEGY ELLIPTA 100-62.5-25 MCG/ACT IN AEPB: 1.0000 | INHALATION_SPRAY | Freq: Every day | RESPIRATORY_TRACT | 5 refills | Status: DC

## 2021-12-19 NOTE — Progress Notes (Signed)
[facility]  Chief Complaint  Patient presents with   Follow-up    CPAP working well.  Chest congestion since having pneumonia.  Sometimes has to gasp for air    Constitutional:  BP 134/68 (BP Location: Left Arm, Patient Position: Sitting)   Pulse 90   Temp 98.4 F (36.9 C) (Temporal)   Ht '5\' 9"'$  (1.753 m)   Wt 176 lb 3.2 oz (79.9 kg)   SpO2 95% Comment: ra  BMI 26.02 kg/m   Past Medical History:  OA, Bipolar, DM, Back pain, Depression, Diplopia, GERD, Headache, HTN, IBS, Nephrolithiasis, Multiple personality disorder, Panic attacks, PUD, Neuropathy, Shingles, Rosacea  Past Surgical History:  She  has a past surgical history that includes Tonsillectomy; Foot arthrodesis (2000); Bunionectomy with hammertoe reconstruction (Right, 12/10/2012); Rectal surgery; Liposuction (03/2018); Colonoscopy with propofol (N/A, 04/27/2020); biopsy (04/27/2020); and Total abdominal hysterectomy.  Brief Summary:  Cynthia Deleon is a 66 y.o. female former smoker with allergic asthma, emphysema and obstructive sleep apnea.       Subjective:   She was in hospital in June for Rains likely from polypharmacy.  She thinks it was from her sleeping medicine.  Her CT chest showed stable emphysema and lung nodules.  She did have calcification in LAD and RCA.  She has been getting short of breath with activity and feels heavy in her chest when this happens.  She feels like she has to gasp for breath sometimes also.  CPAP is working okay.  Physical Exam:   Appearance - well kempt   ENMT - no sinus tenderness, no oral exudate, no LAN, Mallampati 3 airway, no stridor  Respiratory - equal breath sounds bilaterally, no wheezing or rales  CV - s1s2 regular rate and rhythm, no murmurs  Ext - no clubbing, no edema  Skin - no rashes  Psych - normal mood and affect       Pulmonary testing:  PFT 05/13/19 >> FEV1 2.06 (69%), FEV1% 78, TLC 4.56 (78%),  DLCO 97%, +BD  Chest Imaging:  LDCT chest 04/20/19 >> mild centrilobular emphysema, atherosclerosis, 3 mm nodule RML, fatty liver LDCT chest 12/10/21 >> atherosclerosis, mild centrilobular emphysema, 4.4 mm RML nodule, fatty liver  Sleep Tests:  PSG 04/30/21 >> AHI 6.5, SpO2 low 84%; spent 162.5 min with an SpO2 < 88% CPAP titration 08/13/21 >> CPAP with 2 liters O2 >> AHI 0 ONO with CPAP 09/25/21 >> test time 10 hrs 2 min.  Mean SpO2 88%, low SpO2 81%.  Spent 4 hrs with SpO2 < 88% Auto CPAP 11/18/21 to 12/17/21 >> used on 18 of 30 nights with average 4 hrs 34 min.  Average AHI 2.8 with median CPAP 6 and 95 th percentile CPAP 8 cm H2O  Cardiac Tests:  Echo 02/17/18 >> EF 55 to 60%, grade 1 DD  Social History:  She  reports that she quit smoking about 4 years ago. Her smoking use included cigarettes. She has a 42.00 pack-year smoking history. She has never used smokeless tobacco. She reports that she does not drink alcohol and does not use drugs.  Family History:  Her family history includes Alcohol abuse in her brother, brother, brother, and brother; COPD in her brother, father, and sister; Diabetes in her maternal grandfather, maternal grandmother, mother, and sister; Hyperlipidemia in her sister; Other in her brother, mother, and sister.     Assessment/Plan:   Allergic asthma and emphysema. - will have her try trelegy 100 one puff  daily - continue singulair - prn albuterol continue singulair - prn albuterol  Perennial allergic rhinitis. - continue singulair, flonase, claritin - prn azelastine  History of tobacco abuse. - she start smoking as a teenager, smoked 1 ppd, and quit in 2019 - follow up low dose CT chest in July 2024  Obstructive sleep apnea and nocturnal hypoxemia. - she is compliant with CPAP and reports benefit from therapy - she uses [facility] for her DME - current CPAP ordered on 08/16/21 - continue auto CPAP 5 to 10 cm H2O - will make sure she gets 2  liters oxygen set up to use a night with CPAP  Dyspnea on exertion with coronary calcification. - she would like to have further assessment with cardiology  Recurrent episodes of thrush and vaginal candidiasis. - she usually needs a course of diflucan when she takes antibiotics  Time Spent Involved in Patient Care on Day of Examination:  47 minutes  Follow up:   Patient Instructions  Trelegy one puff daily, and rinse your mouth after each use  Will arrange for referral to cardiology  Will have patient care coordinator check on status of getting 2 liters oxygen at night with CPAP from [place]  Follow up in 4 months  Medication List:   Allergies as of 12/19/2021       Reactions   Chantix [varenicline] Other (See Comments)   Altered mental status-Per patient was put on allergy list by Dr Lorriane Shire bu patient staes she is not allergic to this and is currently taking it.    Divalproex Sodium Other (See Comments)   Hallucinations  Other reaction(s): Confusion   Other Anaphylaxis   Pseudoeph-hydrocodone-gg Nausea Only   Sulfa Antibiotics Anaphylaxis, Nausea Only, Swelling, Other (See Comments)   Swelling of tongue.   Varenicline Tartrate Other (See Comments)   Other reaction(s): Confusion   Ambien [zolpidem] Other (See Comments)   Causes sleep walking   Clavulanic Acid Diarrhea   Codeine Nausea And Vomiting, Other (See Comments)   Elemental Sulfur Other (See Comments)   Swelling of tongue   Hydrocod Poli-chlorphe Poli Er Other (See Comments)   Other reaction(s): Skin itches   Hydrocodone Nausea And Vomiting   Macrobid [nitrofurantoin] Nausea And Vomiting   Dizziness, nausea, vomiting   Paxil [paroxetine Hcl] Other (See Comments)   Causes ringing in the ears.    Prednisone Other (See Comments)   Other reaction(s): Unknown   Amoxicillin Rash   Has patient had a PCN reaction causing immediate rash, facial/tongue/throat swelling, SOB or lightheadedness with  hypotension: No Has patient had a PCN reaction causing severe rash involving mucus membranes or skin necrosis: No Has patient had a PCN reaction that required hospitalization: No Has patient had a PCN reaction occurring within the last 10 years: Yes If all of the above answers are "NO", then may proceed with Cephalosporin use.   Farxiga [dapagliflozin]    RECURRENT YEAST/UTI   Metformin And Related Diarrhea   Tape Rash        Medication List        Accurate as of December 19, 2021  2:46 PM. If you have any questions, ask your nurse or doctor.          albuterol 108 (90 Base) MCG/ACT inhaler Commonly known as: VENTOLIN HFA Inhale 2 puffs into the lungs every 6 (six) hours as needed. What changed: reasons to take this   atorvastatin 40 MG tablet Commonly known as: LIPITOR TAKE ONE TABLET  BY MOUTH ONCE DAILY.   Contour Next Test test strip Generic drug: glucose blood Test BS 4 times daily Dx E11.9   cyanocobalamin 1000 MCG tablet Take 1 tablet (1,000 mcg total) by mouth daily.   DULoxetine 60 MG capsule Commonly known as: Cymbalta Take 1 capsule (60 mg total) by mouth daily.   Estradiol 10 MCG Tabs vaginal tablet Commonly known as: Vagifem Place 1 tablet (10 mcg total) vaginally daily for 14 days, THEN 1 tablet (10 mcg total) 2 (two) times a week for 14 days. Start taking on: December 07, 2021   fluticasone 50 MCG/ACT nasal spray Commonly known as: FLONASE USE 2 SPRAYS IN EACH NOSTRIL ONCE DAILY.   glipiZIDE 5 MG 24 hr tablet Commonly known as: GLUCOTROL XL Take 1 tablet (5 mg total) by mouth 2 (two) times daily with a meal.   guaiFENesin-dextromethorphan 100-10 MG/5ML syrup Commonly known as: ROBITUSSIN DM Take 5 mLs by mouth every 4 (four) hours as needed for cough.   Januvia 100 MG tablet Generic drug: sitaGLIPtin TAKE (1) TABLET BY MOUTH ONCE DAILY. What changed: See the new instructions.   lamoTRIgine 200 MG tablet Commonly known as: LAMICTAL Take 200 mg  by mouth at bedtime. For mood disorder.   lansoprazole 30 MG capsule Commonly known as: PREVACID TAKE (1) CAPSULE BY MOUTH ONCE DAILY. What changed: See the new instructions.   lisinopril 5 MG tablet Commonly known as: ZESTRIL TAKE (1) TABLET BY MOUTH ONCE DAILY. What changed: See the new instructions.   meclizine 25 MG tablet Commonly known as: ANTIVERT Take 2 tablets (50 mg total) by mouth daily as needed for dizziness.   montelukast 10 MG tablet Commonly known as: SINGULAIR TAKE (1) TABLET BY MOUTH AT BEDTIME.   OneTouch Delica Lancets 85I Misc Use to test blood sugar twice daily. DX: E11.9   oxybutynin 15 MG 24 hr tablet Commonly known as: DITROPAN XL Take 1 tablet (15 mg total) by mouth at bedtime.   oxyCODONE 15 MG immediate release tablet Commonly known as: ROXICODONE Take 1 tablet (15 mg total) by mouth every 6 (six) hours as needed for pain.   pregabalin 150 MG capsule Commonly known as: LYRICA Take 1 capsule (150 mg total) by mouth 3 (three) times daily.   Premarin 0.3 MG tablet Generic drug: estrogens (conjugated) TAKE (1) TABLET BY MOUTH ONCE DAILY. What changed: See the new instructions.   promethazine 12.5 MG tablet Commonly known as: PHENERGAN Take 1 tablet (12.5 mg total) by mouth every 8 (eight) hours as needed for nausea or vomiting.   Trelegy Ellipta 100-62.5-25 MCG/ACT Aepb Generic drug: Fluticasone-Umeclidin-Vilant Inhale 1 puff into the lungs daily. Started by: Chesley Mires, MD   Trulicity 1.5 OE/7.0JJ Sopn Generic drug: Dulaglutide Inject 1.5 mg into the skin once a week.   valACYclovir 500 MG tablet Commonly known as: VALTREX TAKE (1) TABLET BY MOUTH ONCE DAILY AS NEEDED FOR SHINGLES FLARE UP. What changed: See the new instructions.   Vitamin D3 25 MCG tablet Commonly known as: Vitamin D Take 1 tablet (1,000 Units total) by mouth daily.        Signature:  Chesley Mires, MD [place] Pager - [phone] 12/19/2021, 2:46 PM

## 2021-12-19 NOTE — Patient Instructions (Signed)
Trelegy one puff daily, and rinse your mouth after each use  Will arrange for referral to cardiology  Will have patient care coordinator check on status of getting 2 liters oxygen at night with CPAP from [facility]  Follow up in 4 months

## 2021-12-21 ENCOUNTER — Other Ambulatory Visit: Payer: Self-pay | Admitting: Family Medicine

## 2021-12-21 ENCOUNTER — Telehealth: Payer: Self-pay | Admitting: Pharmacist

## 2021-12-21 ENCOUNTER — Telehealth: Payer: Self-pay | Admitting: Pulmonary Disease

## 2021-12-21 DIAGNOSIS — E11649 Type 2 diabetes mellitus with hypoglycemia without coma: Secondary | ICD-10-CM

## 2021-12-21 DIAGNOSIS — I10 Essential (primary) hypertension: Secondary | ICD-10-CM

## 2021-12-21 DIAGNOSIS — J309 Allergic rhinitis, unspecified: Secondary | ICD-10-CM

## 2021-12-21 DIAGNOSIS — E1142 Type 2 diabetes mellitus with diabetic polyneuropathy: Secondary | ICD-10-CM

## 2021-12-21 DIAGNOSIS — Z79899 Other long term (current) drug therapy: Secondary | ICD-10-CM

## 2021-12-21 MED ORDER — SITAGLIPTIN PHOSPHATE 100 MG PO TABS: 100.0000 mg | ORAL_TABLET | Freq: Every day | ORAL | 3 refills | Status: DC

## 2021-12-21 MED ORDER — LISINOPRIL 5 MG PO TABS: 5.0000 mg | ORAL_TABLET | Freq: Every day | ORAL | 0 refills | Status: DC

## 2021-12-21 MED ORDER — ATORVASTATIN CALCIUM 40 MG PO TABS: 40.0000 mg | ORAL_TABLET | Freq: Every day | ORAL | 0 refills | Status: DC

## 2021-12-21 MED ORDER — MONTELUKAST SODIUM 10 MG PO TABS: ORAL_TABLET | ORAL | 1 refills | Status: DC

## 2021-12-21 NOTE — Telephone Encounter (Signed)
OV notes from 7/12 printed and faxed to Mace[facility] at [phone]. Nothing further needed at this time.

## 2021-12-21 NOTE — Telephone Encounter (Signed)
Refills sent for pill packaging

## 2021-12-24 NOTE — Telephone Encounter (Signed)
Requested refill for controlled substance-ntbs in office.  Lyrica refused-pt does have an appt 01/18/22 with PCP-if pt is completely out ? Earlier appt

## 2021-12-25 DIAGNOSIS — L97511 Non-pressure chronic ulcer of other part of right foot limited to breakdown of skin: Secondary | ICD-10-CM | POA: Insufficient documentation

## 2021-12-25 DIAGNOSIS — G4733 Obstructive sleep apnea (adult) (pediatric): Secondary | ICD-10-CM | POA: Diagnosis not present

## 2021-12-25 DIAGNOSIS — Z860101 Personal history of adenomatous and serrated colon polyps: Secondary | ICD-10-CM | POA: Insufficient documentation

## 2021-12-25 DIAGNOSIS — Z86010 Personal history of colonic polyps: Secondary | ICD-10-CM | POA: Insufficient documentation

## 2021-12-26 ENCOUNTER — Ambulatory Visit: Payer: Medicare Other

## 2021-12-27 MED ORDER — GLIPIZIDE ER 5 MG PO TB24: 5.0000 mg | ORAL_TABLET | Freq: Two times a day (BID) | ORAL | 5 refills | Status: DC

## 2021-12-27 NOTE — Addendum Note (Signed)
Addended by: Lottie Dawson D on: 12/27/2021 12:32 PM   Modules accepted: Orders

## 2022-01-01 ENCOUNTER — Telehealth: Payer: Self-pay | Admitting: Pulmonary Disease

## 2022-01-02 NOTE — Telephone Encounter (Signed)
I called the patient and was not able to leave a message. Can try again later.

## 2022-01-03 ENCOUNTER — Ambulatory Visit: Payer: Self-pay | Admitting: *Deleted

## 2022-01-03 DIAGNOSIS — I10 Essential (primary) hypertension: Secondary | ICD-10-CM

## 2022-01-03 DIAGNOSIS — E11649 Type 2 diabetes mellitus with hypoglycemia without coma: Secondary | ICD-10-CM

## 2022-01-03 NOTE — Telephone Encounter (Signed)
Tried calling the pt and there was no answer and no option to leave msg. Will call back again tomorrow.

## 2022-01-03 NOTE — Patient Instructions (Addendum)
Cynthia Deleon  I have either worked with you through the Chronic Care Management Program at [facility] or I was listed on your Care Team at some point within the last 4 years. Due to program changes I am removing myself from your care team because you've either met our goals, your conditions are stable, you no longer require care management, we haven't engaged within the past 6 months, or we have never worked together through the [facility].   If you are currently active with another CCM Team Member, you will remain active with them unless they reach out to you with additional information.   If you feel that you need services in the future,  please talk with your primary care provider and request a new referral for Care Management or Care Coordination. This does not affect your status as a patient at [facility].   Thank you for allowing me to participate in your your healthcare journey.  [name], BSN, RN-BC Embedded Chronic Care Manager [facility] / [facility] Direct Dial: [phone]

## 2022-01-03 NOTE — Chronic Care Management (AMB) (Signed)
  Chronic Care Management   Note  01/03/2022 Name: Cynthia Deleon MRN: [id number] DOB: 06-23-1955   Patient has either met RN Care Management goals, is stable from [facility] Management perspective, or has not recently engaged with the RN Care Manager. I am removing RN Care Manager from Care Team and closing [name]. Patient is currently engaged with another CCM team member, Lottie Dawson, PharmD. If they are engaged with another CCM Team Member, I will forward this case closure encounter to them. Patient does not have a current CCM referral placed since 10/08/21. CCM enrollment status was not accurate and was changed to "not enrolled".  Their PCP can place a new referral if the patient needs Care Management or Care Coordination services in the future.  [name], BSN, RN-BC Embedded Chronic Care Manager [facility] / [facility] Direct Dial: [phone]

## 2022-01-04 ENCOUNTER — Ambulatory Visit: Payer: Medicare Other | Admitting: Cardiology

## 2022-01-04 MED ORDER — FLUCONAZOLE 150 MG PO TABS: 150.0000 mg | ORAL_TABLET | ORAL | 0 refills | Status: DC

## 2022-01-04 MED ORDER — DOXYCYCLINE HYCLATE 100 MG PO TABS: 100.0000 mg | ORAL_TABLET | Freq: Two times a day (BID) | ORAL | 0 refills | Status: AC

## 2022-01-04 MED ORDER — PREDNISONE 10 MG PO TABS: 40.0000 mg | ORAL_TABLET | Freq: Every day | ORAL | 0 refills | Status: AC

## 2022-01-04 NOTE — Telephone Encounter (Signed)
Called patient but she did not answer. VM is not setup. Will call back later.

## 2022-01-04 NOTE — Telephone Encounter (Signed)
Cough and wheeze with diagnosis of COPD and asthma. Recommend prednisone 40 mg daily x 5 days, doxycycline 100 mg BID x 14 days, Diflucan 150 mg every 3 days x 3.

## 2022-01-04 NOTE — Telephone Encounter (Signed)
Called and notified patient of recommendations from DOD. She voiced understanding about how to take medications. Nothing further needed at this time.

## 2022-01-04 NOTE — Telephone Encounter (Signed)
[phone].  Needing antibiotic.  Will also need Diflucan from taking antibiotic Please advise.  Cynthia Deleon.

## 2022-01-04 NOTE — Telephone Encounter (Signed)
Primary Pulmonologist: Dr. 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  No- no [place] appts available    Reason for call: Patient has cough and wheezing. Patient is using mucus relief twice a day but states this is not working for her.  No fevers. Has been having worsening cough and wheezing for at least a week. Patient states at her last ov, Dr. Halford Chessman told her to call if the mucus relief didn't help and he would send in an antibiotic for her, patient is also requesting diflucan to go along with antibiotic   Dr. Halford Chessman is out of office, sending to DOD.  Dr. Silas Flood please advise.   Allergies  Allergen Reactions   Chantix [Varenicline] Other (See Comments)    Altered mental status-Per patient was put on allergy list by Dr Lorriane Shire bu patient staes she is not allergic to this and is currently taking it.    Divalproex Sodium Other (See Comments)    Hallucinations  Other reaction(s): Confusion   Other Anaphylaxis   Pseudoeph-Hydrocodone-Gg Nausea Only   Sulfa Antibiotics Anaphylaxis, Nausea Only, Swelling and Other (See Comments)    Swelling of tongue.   Varenicline Tartrate Other (See Comments)    Other reaction(s): Confusion   Ambien [Zolpidem] Other (See Comments)    Causes sleep walking   Clavulanic Acid Diarrhea   Codeine Nausea And Vomiting and Other (See Comments)   Elemental Sulfur Other (See Comments)    Swelling of tongue   Hydrocod Poli-Chlorphe Poli Er Other (See Comments)    Other reaction(s): Skin itches    Hydrocodone Nausea And Vomiting   Macrobid [Nitrofurantoin] Nausea And Vomiting    Dizziness, nausea, vomiting   Paxil [Paroxetine Hcl] Other (See Comments)    Causes ringing in the ears.    Prednisone Other (See Comments)    Other reaction(s): Unknown     Amoxicillin Rash    Has patient had a PCN reaction causing immediate rash, facial/tongue/throat swelling, SOB or lightheadedness with hypotension: No Has patient had a PCN reaction causing severe rash involving mucus membranes or skin necrosis: No Has patient had a PCN reaction that required hospitalization: No Has patient had a PCN reaction occurring within the last 10 years: Yes If all of the above answers are "NO", then may proceed with Cephalosporin use.    Farxiga [Dapagliflozin]     RECURRENT YEAST/UTI   Metformin And Related Diarrhea   Tape Rash    Immunization History  Administered Date(s) Administered   DTaP 02/23/1957, 03/23/1957, 04/27/1957, 10/11/1959   Fluad Quad(high Dose 65+) 03/08/2021   IPV 10/26/1957, 03/14/1961, 02/13/1962, 11/22/1965   Influenza Split 05/08/2004, 05/09/2005, 03/26/2016, 02/17/2019   Influenza,inj,Quad PF,6+ Mos 02/18/2017, 02/17/2019   Influenza,inj,quad, With Preservative 04/03/2015, 04/10/2018, 02/17/2019   Influenza-Unspecified 03/10/2020   Moderna SARS-COV2 Booster Vaccination 06/07/2020   Moderna Sars-Covid-2 Vaccination 08/19/2019, 09/23/2019, 06/07/2020   PNEUMOCOCCAL CONJUGATE-20 03/29/2021   Pneumococcal Conjugate-13 03/24/2014, 10/01/2017   Pneumococcal Polysaccharide-23 04/29/2007   Smallpox 10/11/1959   Tdap 11/20/2017   Zoster Recombinat (Shingrix) 03/29/2021   Zoster, Live 04/16/2013    Assessment/Plan:    Allergic asthma and emphysema. - will have her try trelegy 100 one puff daily - continue singulair - prn albuterol continue singulair - prn albuterol   Perennial allergic rhinitis. - continue singulair, flonase, claritin - prn azelastine  History of tobacco abuse. - she start smoking as a teenager, smoked 1 ppd, and quit in 2019 - follow up low dose CT chest in July 2024   Obstructive sleep apnea and nocturnal hypoxemia. - she is compliant with CPAP and reports benefit from therapy - she uses [facility] for her DME - current CPAP ordered on 08/16/21 - continue auto CPAP 5 to 10 cm H2O - will make sure she gets 2 liters oxygen set up to use a night with CPAP   Dyspnea on exertion with coronary calcification. - she would like to have further assessment with cardiology   Recurrent episodes of thrush and vaginal candidiasis. - she usually needs a course of diflucan when she takes antibiotics   Time Spent Involved in Patient Care on Day of Examination:  47 minutes   Follow up:    Patient Instructions  Trelegy one puff daily, and rinse your mouth after each use   Will arrange for referral to cardiology   Will have patient care coordinator check on status of getting 2 liters oxygen at night with CPAP from [facility]   Follow up in 4 months   Medication List:    Allergies as of 12/19/2021         Reactions    Chantix [varenicline] Other (See Comments)    Altered mental status-Per patient was put on allergy list by Dr Lorriane Shire bu patient staes she is not allergic to this and is currently taking it.     Divalproex Sodium Other (See Comments)    Hallucinations  Other reaction(s): Confusion    Other Anaphylaxis    Pseudoeph-hydrocodone-gg Nausea Only    Sulfa Antibiotics Anaphylaxis, Nausea Only, Swelling, Other (See Comments)    Swelling of tongue.    Varenicline Tartrate Other (See Comments)    Other reaction(s): Confusion    Ambien [zolpidem] Other (See Comments)    Causes sleep walking    Clavulanic Acid Diarrhea    Codeine Nausea And Vomiting, Other (See Comments)    Elemental Sulfur Other (See Comments)    Swelling of tongue    Hydrocod Poli-chlorphe Poli Er Other (See Comments)    Other reaction(s): Skin itches    Hydrocodone Nausea And Vomiting    Macrobid [nitrofurantoin] Nausea And Vomiting    Dizziness, nausea, vomiting    Paxil [paroxetine Hcl] Other (See Comments)    Causes ringing in the ears.     Prednisone Other (See Comments)    Other  reaction(s): Unknown    Amoxicillin Rash    Has patient had a PCN reaction causing immediate rash, facial/tongue/throat swelling, SOB or lightheadedness with hypotension: No Has patient had a PCN reaction causing severe rash involving mucus membranes or skin necrosis: No Has patient had a PCN reaction that required hospitalization: No Has patient had a PCN reaction occurring within the last 10 years: Yes If all of the above answers are "NO", then may proceed with Cephalosporin use.    Farxiga [dapagliflozin]      RECURRENT YEAST/UTI    Metformin And Related Diarrhea    Tape Rash            Medication List           Accurate as of December 19, 2021  2:46 PM. If you have any questions, ask your nurse or doctor.              albuterol 108 (90 Base) MCG/ACT inhaler Commonly known as: VENTOLIN HFA  Inhale 2 puffs into the lungs every 6 (six) hours as needed. What changed: reasons to take this    atorvastatin 40 MG tablet Commonly known as: LIPITOR TAKE ONE TABLET BY MOUTH ONCE DAILY.    Contour Next Test test strip Generic drug: glucose blood Test BS 4 times daily Dx E11.9    cyanocobalamin 1000 MCG tablet Take 1 tablet (1,000 mcg total) by mouth daily.    DULoxetine 60 MG capsule Commonly known as: Cymbalta Take 1 capsule (60 mg total) by mouth daily.    Estradiol 10 MCG Tabs vaginal tablet Commonly known as: Vagifem Place 1 tablet (10 mcg total) vaginally daily for 14 days, THEN 1 tablet (10 mcg total) 2 (two) times a week for 14 days. Start taking on: December 07, 2021    fluticasone 50 MCG/ACT nasal spray Commonly known as: FLONASE USE 2 SPRAYS IN EACH NOSTRIL ONCE DAILY.    glipiZIDE 5 MG 24 hr tablet Commonly known as: GLUCOTROL XL Take 1 tablet (5 mg total) by mouth 2 (two) times daily with a meal.    guaiFENesin-dextromethorphan 100-10 MG/5ML syrup Commonly known as: ROBITUSSIN DM Take 5 mLs by mouth every 4 (four) hours as needed for cough.    Januvia 100 MG  tablet Generic drug: sitaGLIPtin TAKE (1) TABLET BY MOUTH ONCE DAILY. What changed: See the new instructions.    lamoTRIgine 200 MG tablet Commonly known as: LAMICTAL Take 200 mg by mouth at bedtime. For mood disorder.    lansoprazole 30 MG capsule Commonly known as: PREVACID TAKE (1) CAPSULE BY MOUTH ONCE DAILY. What changed: See the new instructions.    lisinopril 5 MG tablet Commonly known as: ZESTRIL TAKE (1) TABLET BY MOUTH ONCE DAILY. What changed: See the new instructions.    meclizine 25 MG tablet Commonly known as: ANTIVERT Take 2 tablets (50 mg total) by mouth daily as needed for dizziness.    montelukast 10 MG tablet Commonly known as: SINGULAIR TAKE (1) TABLET BY MOUTH AT BEDTIME.    OneTouch Delica Lancets 20U Misc Use to test blood sugar twice daily. DX: E11.9    oxybutynin 15 MG 24 hr tablet Commonly known as: DITROPAN XL Take 1 tablet (15 mg total) by mouth at bedtime.    oxyCODONE 15 MG immediate release tablet Commonly known as: ROXICODONE Take 1 tablet (15 mg total) by mouth every 6 (six) hours as needed for pain.    pregabalin 150 MG capsule Commonly known as: LYRICA Take 1 capsule (150 mg total) by mouth 3 (three) times daily.    Premarin 0.3 MG tablet Generic drug: estrogens (conjugated) TAKE (1) TABLET BY MOUTH ONCE DAILY. What changed: See the new instructions.    promethazine 12.5 MG tablet Commonly known as: PHENERGAN Take 1 tablet (12.5 mg total) by mouth every 8 (eight) hours as needed for nausea or vomiting.    Trelegy Ellipta 100-62.5-25 MCG/ACT Aepb Generic drug: Fluticasone-Umeclidin-Vilant Inhale 1 puff into the lungs daily. Started by: Chesley Mires, MD    Trulicity 1.5 RK/2.7CW Sopn Generic drug: Dulaglutide Inject 1.5 mg into the skin once a week.    valACYclovir 500 MG tablet Commonly known as: VALTREX TAKE (1) TABLET BY MOUTH ONCE DAILY AS NEEDED FOR SHINGLES FLARE UP. What changed: See the new instructions.     Vitamin D3 25 MCG tablet Commonly known as: Vitamin D Take 1 tablet (1,000 Units total) by mouth daily.

## 2022-01-07 DIAGNOSIS — E1165 Type 2 diabetes mellitus with hyperglycemia: Secondary | ICD-10-CM | POA: Diagnosis not present

## 2022-01-10 NOTE — Progress Notes (Signed)
Referral placed.

## 2022-01-10 NOTE — Addendum Note (Signed)
Addended by: [name] F on: 01/10/2022 01:07 PM   Modules accepted: Orders

## 2022-01-11 ENCOUNTER — Telehealth: Payer: Self-pay

## 2022-01-11 NOTE — Chronic Care Management (AMB) (Signed)
  Chronic Care Management   Note  01/11/2022 Name: Cynthia Deleon MRN: [id number] DOB: 1955-07-31  Cynthia Deleon is a 66 y.o. year old female who is a primary care patient of Loman Brooklyn, FNP. I reached out to Jodell Cipro by phone today in response to a referral sent by Ms. Lincoln Brigham Sahagun's PCP.  Ms. Russman was given information about Chronic Care Management services today including:  CCM service includes personalized support from designated clinical staff supervised by her physician, including individualized plan of care and coordination with other care providers 24/7 contact phone numbers for assistance for urgent and routine care needs. Service will only be billed when office clinical staff spend 20 minutes or more in a month to coordinate care. Only one practitioner may furnish and bill the service in a calendar month. The patient may stop CCM services at any time (effective at the end of the month) by phone call to the office staff. The patient is responsible for co-pay (up to 20% after annual deductible is met) if co-pay is required by the individual health plan.   Patient did not agree to enrollment in care management services and does not wish to consider at this time.  Follow up plan: Patient declines further follow up and engagement by the care management team. Appropriate care team members and provider have been notified via electronic communication.   Noreene Larsson, [place], [place] [address] Direct Dial: [phone] [email or web address]

## 2022-01-11 NOTE — Telephone Encounter (Signed)
Yes I explanined that it was for Almyra Free and she said I have told them several times I didn't want to peak with her. She said that she thought it may have been about getting meds in a 3 month supply and she was not interested in that. Please advise if I need to call back.   Thank you, Noreene Larsson, [place], [place] [address] Direct Dial: [phone] [email or web address]

## 2022-01-14 ENCOUNTER — Ambulatory Visit (INDEPENDENT_AMBULATORY_CARE_PROVIDER_SITE_OTHER): Payer: Medicare Other | Admitting: Nurse Practitioner

## 2022-01-14 ENCOUNTER — Other Ambulatory Visit: Payer: Self-pay | Admitting: Nurse Practitioner

## 2022-01-14 ENCOUNTER — Telehealth: Payer: Self-pay | Admitting: Family Medicine

## 2022-01-14 ENCOUNTER — Encounter: Payer: Self-pay | Admitting: Nurse Practitioner

## 2022-01-14 DIAGNOSIS — R300 Dysuria: Secondary | ICD-10-CM | POA: Diagnosis not present

## 2022-01-14 LAB — URINALYSIS, ROUTINE W REFLEX MICROSCOPIC
Bilirubin, UA: NEGATIVE
Glucose, UA: NEGATIVE
Nitrite, UA: NEGATIVE
Specific Gravity, UA: 1.025 (ref 1.005–1.030)
Urobilinogen, Ur: 1 mg/dL (ref 0.2–1.0)
pH, UA: 6 (ref 5.0–7.5)

## 2022-01-14 LAB — MICROSCOPIC EXAMINATION
Epithelial Cells (non renal): NONE SEEN /hpf (ref 0–10)
RBC, Urine: >30 /hpf — AB (ref 0–2)
Renal Epithel, UA: NONE SEEN /hpf
WBC, UA: >30 /hpf — AB (ref 0–5)

## 2022-01-14 MED ORDER — CIPROFLOXACIN HCL 500 MG PO TABS: 500.0000 mg | ORAL_TABLET | Freq: Two times a day (BID) | ORAL | 0 refills | Status: DC

## 2022-01-14 NOTE — Telephone Encounter (Signed)
  Prescription Request  01/14/2022  Is this a "Controlled Substance" medicine? no  Have you seen your PCP in the last 2 weeks? Phone visit today  If YES, route message to pool  -  If NO, patient needs to be scheduled for appointment.  What is the name of the medication or equipment? diflucan  Have you contacted your pharmacy to request a refill? no Which pharmacy would you like this sent to? [facility]   Patient notified that their request is being sent to the clinical staff for review and that they should receive a response within 2 business days.

## 2022-01-14 NOTE — Telephone Encounter (Signed)
Looks like patient has Diflucan already ordered today by a different provider. Let me know how else I can help you

## 2022-01-14 NOTE — Addendum Note (Signed)
Addended by: Ivy Lynn on: 01/14/2022 01:59 PM   Modules accepted: Orders

## 2022-01-14 NOTE — Progress Notes (Signed)
   Virtual Visit  Note Due to COVID-19 pandemic this visit was conducted virtually. This visit type was conducted due to national recommendations for restrictions regarding the COVID-19 Pandemic (e.g. social distancing, sheltering in place) in an effort to limit this patient's exposure and mitigate transmission in our community. All issues noted in this document were discussed and addressed.  A physical exam was not performed with this format.  I connected with Cynthia Deleon on 01/14/22 at 12:12 pm  by telephone and verified that I am speaking with the correct person using two identifiers. Cynthia Deleon is currently located at home during visit. The provider, Ivy Lynn, NP is located in their office at time of visit.  I discussed the limitations, risks, security and privacy concerns of performing an evaluation and management service by telephone and the availability of in person appointments. I also discussed with the patient that there may be a patient responsible charge related to this service. The patient expressed understanding and agreed to proceed.   History and Present Illness:  Dysuria  This is a new problem. The current episode started yesterday. The problem has been unchanged. The quality of the pain is described as burning. The pain is moderate. There has been no fever. Associated symptoms include chills, frequency, nausea and urgency. She has tried increased fluids for the symptoms. The treatment provided no relief.      Review of Systems  Constitutional:  Positive for chills.  Gastrointestinal:  Positive for nausea.  Genitourinary:  Positive for dysuria, frequency and urgency.  Skin: Negative.   All other systems reviewed and are negative.    Observations/Objective: Tele visit, patient is not in distress  Assessment and Plan:  UTI  vs  interstitial cystitis -urinalysis completed results pending -pyridium for pain Precaution and education provided -All  questions answered -follow up with unresolved symptoms   Follow Up Instructions: Follow up with worsening unresolved symptoms    I discussed the assessment and treatment plan with the patient. The patient was provided an opportunity to ask questions and all were answered. The patient agreed with the plan and demonstrated an understanding of the instructions.   The patient was advised to call back or seek an in-person evaluation if the symptoms worsen or if the condition fails to improve as anticipated.  The above assessment and management plan was discussed with the patient. The patient verbalized understanding of and has agreed to the management plan. Patient is aware to call the clinic if symptoms persist or worsen. Patient is aware when to return to the clinic for a follow-up visit. Patient educated on when it is appropriate to go to the emergency department.   Time call ended:  12:31 pm   I provided 11 minutes of  non face-to-face time during this encounter.    Ivy Lynn, NP

## 2022-01-14 NOTE — Telephone Encounter (Signed)
Pt requesting RF on Diflucan, had Televisit today and put on abx Please advise

## 2022-01-15 NOTE — Telephone Encounter (Signed)
Patient aware.

## 2022-01-16 ENCOUNTER — Telehealth: Payer: Self-pay | Admitting: Family Medicine

## 2022-01-16 ENCOUNTER — Other Ambulatory Visit: Payer: Self-pay | Admitting: Nurse Practitioner

## 2022-01-16 DIAGNOSIS — B379 Candidiasis, unspecified: Secondary | ICD-10-CM

## 2022-01-16 MED ORDER — FLUCONAZOLE 150 MG PO TABS: 150.0000 mg | ORAL_TABLET | Freq: Once | ORAL | 0 refills | Status: AC

## 2022-01-16 NOTE — Telephone Encounter (Signed)
It was sent on 01/05/19 in her medication record, but I sent another one today. She needs to call her pharmacy

## 2022-01-16 NOTE — Telephone Encounter (Signed)
Appointment scheduled.

## 2022-01-17 LAB — CULTURE, URINE COMPREHENSIVE

## 2022-01-17 NOTE — Telephone Encounter (Signed)
Left detailed message.   

## 2022-01-18 ENCOUNTER — Other Ambulatory Visit: Payer: Self-pay | Admitting: Nurse Practitioner

## 2022-01-18 ENCOUNTER — Other Ambulatory Visit: Payer: Self-pay | Admitting: Family Medicine

## 2022-01-18 ENCOUNTER — Ambulatory Visit: Payer: Medicare Other | Admitting: Family Medicine

## 2022-01-18 DIAGNOSIS — N393 Stress incontinence (female) (male): Secondary | ICD-10-CM

## 2022-01-18 DIAGNOSIS — R8279 Other abnormal findings on microbiological examination of urine: Secondary | ICD-10-CM

## 2022-01-18 DIAGNOSIS — Z79899 Other long term (current) drug therapy: Secondary | ICD-10-CM

## 2022-01-18 DIAGNOSIS — I10 Essential (primary) hypertension: Secondary | ICD-10-CM

## 2022-01-18 DIAGNOSIS — E11649 Type 2 diabetes mellitus with hypoglycemia without coma: Secondary | ICD-10-CM

## 2022-01-18 DIAGNOSIS — E1142 Type 2 diabetes mellitus with diabetic polyneuropathy: Secondary | ICD-10-CM

## 2022-01-18 MED ORDER — FOSFOMYCIN TROMETHAMINE 3 G PO PACK: 3.0000 g | PACK | Freq: Once | ORAL | 0 refills | Status: AC

## 2022-01-22 ENCOUNTER — Telehealth: Payer: Self-pay | Admitting: Pulmonary Disease

## 2022-01-22 DIAGNOSIS — G4733 Obstructive sleep apnea (adult) (pediatric): Secondary | ICD-10-CM

## 2022-01-22 NOTE — Telephone Encounter (Signed)
Called and spoke to patient, she states that at last ov we sent an order for an O2.  She states that she has not heard back about any oxygen order. She states that no one from [place] has called her.   Called and spoke to [facility] and she states that patient  still needs a cpap titration done in order to obtain oxygen from them.   Patient asked to return call tomorrow morning. Will inform her then of what Mariann Laster said.

## 2022-01-22 NOTE — Telephone Encounter (Signed)
ATC patient.  LMTCB. 

## 2022-01-22 NOTE — Telephone Encounter (Signed)
Pt returning call

## 2022-01-23 ENCOUNTER — Telehealth: Payer: Self-pay | Admitting: Family Medicine

## 2022-01-23 DIAGNOSIS — M5136 Other intervertebral disc degeneration, lumbar region: Secondary | ICD-10-CM | POA: Diagnosis not present

## 2022-01-23 DIAGNOSIS — M5030 Other cervical disc degeneration, unspecified cervical region: Secondary | ICD-10-CM | POA: Diagnosis not present

## 2022-01-23 DIAGNOSIS — M5412 Radiculopathy, cervical region: Secondary | ICD-10-CM | POA: Diagnosis not present

## 2022-01-23 DIAGNOSIS — Z5181 Encounter for therapeutic drug level monitoring: Secondary | ICD-10-CM | POA: Diagnosis not present

## 2022-01-23 DIAGNOSIS — Z79899 Other long term (current) drug therapy: Secondary | ICD-10-CM | POA: Diagnosis not present

## 2022-01-23 DIAGNOSIS — G894 Chronic pain syndrome: Secondary | ICD-10-CM | POA: Diagnosis not present

## 2022-01-23 NOTE — Telephone Encounter (Signed)
Left message for patient to call back and schedule Medicare Annual Wellness Visit (AWV) to be completed by video or phone.     Please schedule at anytime with [facility]     45 minute appointment  Any questions, please contact me at [phone]

## 2022-01-23 NOTE — Telephone Encounter (Signed)
Attempted to call pt but unable to reach. Left message for her to return call. 

## 2022-01-24 NOTE — Progress Notes (Signed)
Cardiology Office Note:   Date:  01/25/2022  NAME:  Cynthia Deleon    MRN: [id number] DOB:  Oct 01, 1955   PCP:  Loman Brooklyn, FNP  Cardiologist:  None  Electrophysiologist:  None   Referring MD: Chesley Mires, MD   Chief Complaint  Patient presents with   coronary calcification    History of Present Illness:   Cynthia Deleon is a 66 y.o. female with a hx of diabetes, COPD, OSA who is being seen today for the evaluation of coronary calcification seen on chest CT at the request of Chesley Mires, MD. underwent recent lung cancer screening chest CT.  Mild coronary calcification seen in the LAD.  She is a former smoker of 30 years.  She quit several years ago.  She does have COPD as well as sleep apnea.  She is diabetic.  She has never had a heart attack or stroke but she presents with her sister.  Apparently her sister has had bypass surgery as well as stents.  Her EKG is normal.  She reports she can walk for 15 to 20 minutes without major limitations.  She reports no chest pain.  She can get short winded but no chest pressure or chest tightness.  Her cholesterol could be a bit better.  I did review her CT scan which shows mild coronary calcifications in the LAD.  All of her other lab work seems to be unremarkable.  She is working on a diet plan to improve her diabetes.  Her blood pressure is well controlled in office.  CV exam is normal.  She does not drink alcohol.  No drug use.  Problem List COPD -30 years 1 ppd OSA DM -A1c 7.3 4. HLD -T chol 169, HDL 52, LDL 96, TG 118 5. HTN  Past Medical History: Past Medical History:  Diagnosis Date   Aortic atherosclerosis ([place]) 10/31/2021   Arthritis    hands and knees   Bipolar 1 disorder (HCC)    Borderline diabetic    Chronic back pain    Chronic neck pain    Complication of anesthesia    high anxiety-does not want to be alone   COPD (chronic obstructive pulmonary disease) (HCC)    Current use of estrogen therapy 01/12/2014    Depression    Diabetes mellitus without complication (HCC)    Diplopia    Family history of adverse reaction to anesthesia    sister "gas in lungs"   GERD (gastroesophageal reflux disease)    Headache    Hepatic steatosis 10/31/2021   Hot flashes 12/15/2013   Hypertension    IBS (irritable bowel syndrome)    Incontinence    Kidney stone    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[place])    Panic attacks    Peptic ulcer    Peripheral neuropathy    Rosacea    Shingles    Sleep apnea    uses a cpap-with oxygen    Past Surgical History: Past Surgical History:  Procedure Laterality Date   BIOPSY  04/27/2020   Procedure: BIOPSY;  Surgeon: Ronald Lobo, MD;  Location: [facility];  Service: Endoscopy;;   BUNIONECTOMY WITH HAMMERTOE RECONSTRUCTION Right 12/10/2012   Procedure: RIGHT FIRST METATARSAL CHEVRON BUNION CORRECTION,  2 AND 3 HAMMERTOE CORRECTION , RIGHT 3 AND 4 TOE NAIL EXCISION ;  Surgeon: Wylene Simmer, MD;  Location: [facility];  Service: Orthopedics;  Laterality: Right;   COLONOSCOPY WITH PROPOFOL N/A 04/27/2020   Procedure: COLONOSCOPY WITH PROPOFOL;  Surgeon: Ronald Lobo, MD;  Location: [facility];  Service: Endoscopy;  Laterality: N/A;   FOOT ARTHRODESIS  2000   both feet   LIPOSUCTION  03/2018   RECTAL SURGERY     TONSILLECTOMY     TOTAL ABDOMINAL HYSTERECTOMY      Current Medications: Current Meds  Medication Sig   albuterol (VENTOLIN HFA) 108 (90 Base) MCG/ACT inhaler Inhale 2 puffs into the lungs every 6 (six) hours as needed. (Patient taking differently: Inhale 2 puffs into the lungs every 6 (six) hours as needed for wheezing or shortness of breath.)   cholecalciferol (VITAMIN D) 25 MCG tablet Take 1 tablet (1,000 Units total) by mouth daily.   diazepam (VALIUM) 5 MG tablet Take 5 mg by mouth 2 (two) times daily.   Dulaglutide (TRULICITY) 1.5 ZO/1.0RU SOPN Inject 1.5 mg into the skin once a week.   DULoxetine (CYMBALTA) 60 MG capsule Take 1 capsule  (60 mg total) by mouth daily.   fluorometholone (FML) 0.1 % ophthalmic suspension SMARTSIG:In Eye(s)   fluticasone (FLONASE) 50 MCG/ACT nasal spray USE 2 SPRAYS IN EACH NOSTRIL ONCE DAILY. (Patient taking differently: Place 2 sprays into both nostrils daily.)   Fluticasone-Umeclidin-Vilant (TRELEGY ELLIPTA) 100-62.5-25 MCG/ACT AEPB Inhale 1 puff into the lungs daily.   glipiZIDE (GLUCOTROL XL) 5 MG 24 hr tablet Take 1 tablet (5 mg total) by mouth 2 (two) times daily with a meal.   glucose blood (CONTOUR NEXT TEST) test strip Test BS 4 times daily Dx E11.9   guaiFENesin-dextromethorphan (ROBITUSSIN DM) 100-10 MG/5ML syrup Take 5 mLs by mouth every 4 (four) hours as needed for cough.   lamoTRIgine (LAMICTAL) 200 MG tablet Take 200 mg by mouth at bedtime. For mood disorder.   lansoprazole (PREVACID) 30 MG capsule TAKE (1) CAPSULE BY MOUTH ONCE DAILY.   lisinopril (ZESTRIL) 5 MG tablet TAKE (1) TABLET BY MOUTH ONCE DAILY.   meclizine (ANTIVERT) 25 MG tablet Take 2 tablets (50 mg total) by mouth daily as needed for dizziness.   montelukast (SINGULAIR) 10 MG tablet TAKE (1) TABLET BY MOUTH AT BEDTIME.   OneTouch Delica Lancets 04V MISC Use to test blood sugar twice daily. DX: E11.9   oxybutynin (DITROPAN XL) 15 MG 24 hr tablet Take 1 tablet (15 mg total) by mouth at bedtime.   oxyCODONE (ROXICODONE) 15 MG immediate release tablet Take 1 tablet (15 mg total) by mouth every 6 (six) hours as needed for pain.   pregabalin (LYRICA) 150 MG capsule Take 1 capsule (150 mg total) by mouth 3 (three) times daily.   PREMARIN 0.3 MG tablet TAKE (1) TABLET BY MOUTH ONCE DAILY. (Patient taking differently: Take 0.3 mg by mouth daily.)   promethazine (PHENERGAN) 12.5 MG tablet Take 1 tablet (12.5 mg total) by mouth every 8 (eight) hours as needed for nausea or vomiting.   sitaGLIPtin (JANUVIA) 100 MG tablet Take 1 tablet (100 mg total) by mouth daily. TAKE (1) TABLET BY MOUTH ONCE DAILY. Strength: 100 mg    valACYclovir (VALTREX) 500 MG tablet TAKE (1) TABLET BY MOUTH ONCE DAILY AS NEEDED FOR SHINGLES FLARE UP. (Patient taking differently: Take 500 mg by mouth as needed (shingle flare-up).)   vitamin B-12 1000 MCG tablet Take 1 tablet (1,000 mcg total) by mouth daily.   [DISCONTINUED] atorvastatin (LIPITOR) 40 MG tablet Take 1 tablet (40 mg total) by mouth daily.     Allergies:    Chantix [varenicline], Divalproex sodium, Other, Pseudoeph-hydrocodone-gg, Sulfa antibiotics, Varenicline tartrate, Ambien [zolpidem], Clavulanic acid, Codeine,  Elemental sulfur, Hydrocod poli-chlorphe poli er, Hydrocodone, Macrobid [nitrofurantoin], Paxil [paroxetine hcl], Prednisone, Amoxicillin, Farxiga [dapagliflozin], Metformin and related, and Tape   Social History: Social History   Socioeconomic History   Marital status: Divorced    Spouse name: Not on file   Number of children: 5   Years of education: 9th   Highest education level: Not on file  Occupational History   Occupation: Disabled  Tobacco Use   Smoking status: Former    Packs/day: 1.00    Years: 42.00    Total pack years: 42.00    Types: Cigarettes    Quit date: 06/10/2017    Years since quitting: 4.6   Smokeless tobacco: Never  Vaping Use   Vaping Use: Never used  Substance and Sexual Activity   Alcohol use: No    Alcohol/week: 0.0 standard drinks of alcohol   Drug use: No   Sexual activity: Never    Birth control/protection: Surgical    Comment: occ cigarette  Other Topics Concern   Not on file  Social History Narrative   Lives at home with home with her son (has five children).   Right-handed.   1 cup caffeine per day.   Social Determinants of Health   Financial Resource Strain: Not on file  Food Insecurity: No Food Insecurity (01/07/2020)   Hunger Vital Sign    Worried About Running Out of Food in the Last Year: Never true    Ran Out of Food in the Last Year: Never true  Transportation Needs: No Transportation Needs  (01/07/2020)   PRAPARE - Hydrologist (Medical): No    Lack of Transportation (Non-Medical): No  Physical Activity: Not on file  Stress: Not on file  Social Connections: Not on file     Family History: The patient's family history includes Alcohol abuse in her brother, brother, brother, and brother; COPD in her brother, father, and sister; Diabetes in her maternal grandfather, maternal grandmother, mother, and sister; Heart attack in her sister; Hyperlipidemia in her sister; Other in her brother, mother, and sister.  ROS:   All other ROS reviewed and negative. Pertinent positives noted in the HPI.     EKGs/Labs/Other Studies Reviewed:   The following studies were personally reviewed by me today:  EKG:  EKG is ordered today.  The ekg ordered today demonstrates normal sinus rhythm heart rate 83, no acute ischemic changes or evidence of infarction, and was personally reviewed by me.   TTE 02/17/2018 - Left ventricle: The cavity size was normal. Wall thickness was    normal. Systolic function was normal. The estimated ejection    fraction was in the range of 55% to 60%. Wall motion was normal;    there were no regional wall motion abnormalities. Doppler    parameters are consistent with abnormal left ventricular    relaxation (grade 1 diastolic dysfunction).   Recent Labs: 10/26/2021: TSH 0.478 11/21/2021: ALT 13 11/22/2021: BUN 15; Creatinine, Ser 0.83; Hemoglobin 14.3; Magnesium 2.1; Platelets 190; Potassium 4.3; Sodium 136   Recent Lipid Panel    Component Value Date/Time   CHOL 169 03/29/2021 1237   TRIG 118 03/29/2021 1237   HDL 52 03/29/2021 1237   CHOLHDL 3.3 03/29/2021 1237   CHOLHDL 4.1 11/16/2015 0715   VLDL 48 (H) 11/16/2015 0715   LDLCALC 96 03/29/2021 1237    Physical Exam:   VS:  BP 118/70   Pulse 83   Ht '5\' 9"'$  (1.753 m)  Wt 179 lb 12.8 oz (81.6 kg)   SpO2 93%   BMI 26.55 kg/m    Wt Readings from Last 3 Encounters:  01/25/22 179  lb 12.8 oz (81.6 kg)  12/19/21 176 lb 3.2 oz (79.9 kg)  12/07/21 180 lb 6.4 oz (81.8 kg)    General: Well nourished, well developed, in no acute distress Head: Atraumatic, normal size  Eyes: PEERLA, EOMI  Neck: Supple, no JVD Endocrine: No thryomegaly Cardiac: Normal S1, S2; RRR; no murmurs, rubs, or gallops Lungs: Clear to auscultation bilaterally, no wheezing, rhonchi or rales  Abd: Soft, nontender, no hepatomegaly  Ext: No edema, pulses 2+ Musculoskeletal: No deformities, BUE and BLE strength normal and equal Skin: Warm and dry, no rashes   Neuro: Alert and oriented to person, place, time, and situation, CNII-XII grossly intact, no focal deficits  Psych: Normal mood and affect   ASSESSMENT:   Cynthia Deleon is a 66 y.o. female who presents for the following: 1. Coronary artery calcification seen on computed tomography   2. Mixed hyperlipidemia   3. Primary hypertension   4. Type 2 diabetes mellitus with hypoglycemia without coma, without long-term current use of insulin ([place])     PLAN:   1. Coronary artery calcification seen on computed tomography 2. Mixed hyperlipidemia -Mild coronary calcification seen in the LAD distribution on recent lung cancer screening chest CT.  No symptoms of angina.  We will repeat an echocardiogram.  Echo in 2019 was normal.  No murmurs on exam.  Symptoms are really not concerning for underlying angina.  I would like to get an echo just to make sure everything is okay.  Her EKG is normal.  Would recommend to increase her Lipitor to 80 mg daily.  No need for aspirin.  She should start to exercise more.  She should work on her diabetes as this will help reduce her risk of having heart attack or stroke in life.  She will see [place] yearly.  3. Primary hypertension -Well-controlled.  No change in medications.  4. Type 2 diabetes mellitus with hypoglycemia without coma, without long-term current use of insulin ([place]) -I have requested she work on her  diabetes.  A1c should be less than 7.  Disposition: Return in about 1 year (around 01/26/2023).  Medication Adjustments/Labs and Tests Ordered: Current medicines are reviewed at length with the patient today.  Concerns regarding medicines are outlined above.  Orders Placed This Encounter  Procedures   EKG 12-Lead   ECHOCARDIOGRAM COMPLETE   Meds ordered this encounter  Medications   atorvastatin (LIPITOR) 80 MG tablet    Sig: Take 1 tablet (80 mg total) by mouth daily.    Dispense:  90 tablet    Refill:  3    Patient Instructions  Medication Instructions:  Increase Lipitor 80 mg daily   *If you need a refill on your cardiac medications before your next appointment, please call your pharmacy*   Testing/Procedures: Echocardiogram - Your physician has requested that you have an echocardiogram. Echocardiography is a painless test that uses sound waves to create images of your heart. It provides your doctor with information about the size and shape of your heart and how well your heart's chambers and valves are working. This procedure takes approximately one hour. There are no restrictions for this procedure.     Follow-Up: At [facility], you and your health needs are our priority.  As part of our continuing mission to provide you with exceptional heart care, we  have created designated Provider Care Teams.  These Care Teams include your primary Cardiologist (physician) and Advanced Practice Providers (APPs -  Physician Assistants and Nurse Practitioners) who all work together to provide you with the care you need, when you need it.  We recommend signing up for the patient portal called "MyChart".  Sign up information is provided on this After Visit Summary.  MyChart is used to connect with patients for Virtual Visits (Telemedicine).  Patients are able to view lab/test results, encounter notes, upcoming appointments, etc.  Non-urgent messages can be sent to your provider as well.   To  learn more about what you can do with MyChart, go to [email or web address].    Your next appointment:   12 month(s)  The format for your next appointment:   In Person  Provider:   Eleonore Chiquito, MD             Signed, Addison Naegeli. Audie Box, MD, [place]  [address] [place], [place] [address] [phone]  01/25/2022 1:38 PM

## 2022-01-25 ENCOUNTER — Encounter: Payer: Self-pay | Admitting: Cardiovascular Disease

## 2022-01-25 ENCOUNTER — Ambulatory Visit (INDEPENDENT_AMBULATORY_CARE_PROVIDER_SITE_OTHER): Payer: Medicare Other | Admitting: Cardiovascular Disease

## 2022-01-25 VITALS — BP 118/70 | HR 83 | Ht 69.0 in | Wt 179.8 lb

## 2022-01-25 DIAGNOSIS — I2510 Atherosclerotic heart disease of native coronary artery without angina pectoris: Secondary | ICD-10-CM | POA: Diagnosis not present

## 2022-01-25 DIAGNOSIS — E11649 Type 2 diabetes mellitus with hypoglycemia without coma: Secondary | ICD-10-CM

## 2022-01-25 DIAGNOSIS — I10 Essential (primary) hypertension: Secondary | ICD-10-CM

## 2022-01-25 DIAGNOSIS — E782 Mixed hyperlipidemia: Secondary | ICD-10-CM | POA: Diagnosis not present

## 2022-01-25 DIAGNOSIS — G4733 Obstructive sleep apnea (adult) (pediatric): Secondary | ICD-10-CM | POA: Diagnosis not present

## 2022-01-25 MED ORDER — ATORVASTATIN CALCIUM 80 MG PO TABS: 80.0000 mg | ORAL_TABLET | Freq: Every day | ORAL | 3 refills | Status: DC

## 2022-01-25 NOTE — Patient Instructions (Signed)
Medication Instructions:  Increase Lipitor 80 mg daily   *If you need a refill on your cardiac medications before your next appointment, please call your pharmacy*   Testing/Procedures: Echocardiogram - Your physician has requested that you have an echocardiogram. Echocardiography is a painless test that uses sound waves to create images of your heart. It provides your doctor with information about the size and shape of your heart and how well your heart's chambers and valves are working. This procedure takes approximately one hour. There are no restrictions for this procedure.     Follow-Up: At [facility], you and your health needs are our priority.  As part of our continuing mission to provide you with exceptional heart care, we have created designated Provider Care Teams.  These Care Teams include your primary Cardiologist (physician) and Advanced Practice Providers (APPs -  Physician Assistants and Nurse Practitioners) who all work together to provide you with the care you need, when you need it.  We recommend signing up for the patient portal called "MyChart".  Sign up information is provided on this After Visit Summary.  MyChart is used to connect with patients for Virtual Visits (Telemedicine).  Patients are able to view lab/test results, encounter notes, upcoming appointments, etc.  Non-urgent messages can be sent to your provider as well.   To learn more about what you can do with MyChart, go to [email or web address].    Your next appointment:   12 month(s)  The format for your next appointment:   In Person  Provider:   Eleonore Chiquito, MD

## 2022-01-29 NOTE — Telephone Encounter (Signed)
Patient is returning phone call. Patient phone number is [phone].

## 2022-01-30 NOTE — Telephone Encounter (Signed)
  Dr. Halford Chessman, looks like back in April we had talked to [facility] as well and she said the titration patient had wasn't accepted by insurance since it was older than 30 days.     10/03/21 10:34 AM Note Called and spoke to [place]. She states since CPAP titration is over 45 days old, for insurance purposes patient will have to repeat test.

## 2022-01-30 NOTE — Telephone Encounter (Signed)
Called and spoke to patient and she wants to do CPAP titration but wants to do it with just the CPAP and not the oxygen at night time.  She states she wants to prove she needs it at night time to get her oxygen. Dr. Halford Chessman please advise are you ok with proceeding with CPAP titration (there is another phone encounter from 10/2021 regarding this)   Patient is also having an issue with her CPAP machine blowing out air after she turns it off. She asks that we place an order for [facility] to look at her cpap machine.

## 2022-01-30 NOTE — Telephone Encounter (Signed)
Order placed for cpap titration   Called and notified patient of orders that were placed.  Nothing further needed at this time.

## 2022-01-30 NOTE — Telephone Encounter (Signed)
Okay.  Then please put in an order for a CPAP titration study.  Have them start on CPAP without oxygen.

## 2022-01-30 NOTE — Telephone Encounter (Signed)
She just had a CPAP titration study done in March and overnight oximetry with CPAP in April.  Both of these showed she needed supplemental oxygen at night with CPAP.  Okay to place order to have [facility] assess her CPAP machine.

## 2022-01-31 ENCOUNTER — Encounter: Payer: Medicare Other | Admitting: Family Medicine

## 2022-02-05 DIAGNOSIS — M5416 Radiculopathy, lumbar region: Secondary | ICD-10-CM | POA: Diagnosis not present

## 2022-02-06 ENCOUNTER — Ambulatory Visit (INDEPENDENT_AMBULATORY_CARE_PROVIDER_SITE_OTHER): Payer: Medicare Other

## 2022-02-06 ENCOUNTER — Encounter: Payer: Self-pay | Admitting: Family Medicine

## 2022-02-06 ENCOUNTER — Ambulatory Visit (INDEPENDENT_AMBULATORY_CARE_PROVIDER_SITE_OTHER): Payer: Medicare Other | Admitting: Family Medicine

## 2022-02-06 VITALS — BP 111/70 | HR 98 | Temp 97.8°F | Ht 69.0 in | Wt 173.4 lb

## 2022-02-06 DIAGNOSIS — M8580 Other specified disorders of bone density and structure, unspecified site: Secondary | ICD-10-CM

## 2022-02-06 DIAGNOSIS — Z780 Asymptomatic menopausal state: Secondary | ICD-10-CM | POA: Diagnosis not present

## 2022-02-06 DIAGNOSIS — K219 Gastro-esophageal reflux disease without esophagitis: Secondary | ICD-10-CM

## 2022-02-06 DIAGNOSIS — E782 Mixed hyperlipidemia: Secondary | ICD-10-CM | POA: Diagnosis not present

## 2022-02-06 DIAGNOSIS — M8589 Other specified disorders of bone density and structure, multiple sites: Secondary | ICD-10-CM | POA: Diagnosis not present

## 2022-02-06 DIAGNOSIS — J449 Chronic obstructive pulmonary disease, unspecified: Secondary | ICD-10-CM

## 2022-02-06 DIAGNOSIS — E1165 Type 2 diabetes mellitus with hyperglycemia: Secondary | ICD-10-CM

## 2022-02-06 DIAGNOSIS — J302 Other seasonal allergic rhinitis: Secondary | ICD-10-CM | POA: Diagnosis not present

## 2022-02-06 DIAGNOSIS — G4733 Obstructive sleep apnea (adult) (pediatric): Secondary | ICD-10-CM

## 2022-02-06 DIAGNOSIS — Z79899 Other long term (current) drug therapy: Secondary | ICD-10-CM

## 2022-02-06 DIAGNOSIS — I700 Atherosclerosis of aorta: Secondary | ICD-10-CM | POA: Diagnosis not present

## 2022-02-06 DIAGNOSIS — E785 Hyperlipidemia, unspecified: Secondary | ICD-10-CM | POA: Insufficient documentation

## 2022-02-06 DIAGNOSIS — E1142 Type 2 diabetes mellitus with diabetic polyneuropathy: Secondary | ICD-10-CM

## 2022-02-06 DIAGNOSIS — I10 Essential (primary) hypertension: Secondary | ICD-10-CM | POA: Diagnosis not present

## 2022-02-06 DIAGNOSIS — N393 Stress incontinence (female) (male): Secondary | ICD-10-CM

## 2022-02-06 DIAGNOSIS — E11649 Type 2 diabetes mellitus with hypoglycemia without coma: Secondary | ICD-10-CM | POA: Diagnosis not present

## 2022-02-06 HISTORY — DX: Other specified disorders of bone density and structure, unspecified site: M85.80

## 2022-02-06 LAB — BAYER DCA HB A1C WAIVED: HB A1C (BAYER DCA - WAIVED): 7.3 % — ABNORMAL HIGH (ref 4.8–5.6)

## 2022-02-06 MED ORDER — LANSOPRAZOLE 30 MG PO CPDR: 30.0000 mg | DELAYED_RELEASE_CAPSULE | Freq: Every day | ORAL | 1 refills | Status: DC

## 2022-02-06 MED ORDER — LISINOPRIL 5 MG PO TABS
5.0000 mg | ORAL_TABLET | Freq: Every day | ORAL | 1 refills | Status: DC
Start: 2022-02-06 — End: 2022-08-22

## 2022-02-06 MED ORDER — PREGABALIN 150 MG PO CAPS: 150.0000 mg | ORAL_CAPSULE | Freq: Three times a day (TID) | ORAL | 5 refills | Status: DC

## 2022-02-06 MED ORDER — OXYBUTYNIN CHLORIDE ER 15 MG PO TB24: 15.0000 mg | ORAL_TABLET | Freq: Every day | ORAL | 1 refills | Status: DC

## 2022-02-06 MED ORDER — TRULICITY 3 MG/0.5ML ~~LOC~~ SOAJ: 3.0000 mg | SUBCUTANEOUS | 2 refills | Status: DC

## 2022-02-06 NOTE — Progress Notes (Addendum)
Assessment & Plan:  1. Essential hypertension Well controlled on current regimen.  - Lipid panel - CBC with Differential/Platelet - CMP14+EGFR - lisinopril (ZESTRIL) 5 MG tablet; Take 1 tablet (5 mg total) by mouth daily.  Dispense: 90 tablet; Refill: 1  2. Mixed hyperlipidemia Well controlled on current regimen.  - Lipid panel - CBC with Differential/Platelet - CMP14+EGFR  3. Aortic atherosclerosis (HCC) Continue statin; start aspirin. - Lipid panel - aspirin EC 81 MG tablet; Take 81 mg by mouth daily. Swallow whole.  4. Type 2 diabetes mellitus with hyperglycemia, without long-term current use of insulin (HCC) Lab Results  Component Value Date   HGBA1C 7.3 (H) 02/06/2022   HGBA1C 7.3 (H) 10/26/2021   HGBA1C 7.4 (H) 03/08/2021    - Diabetes is not at goal of A1c < 7. - Medications: continue current medications, with an increase in Trulicity from 1.5 mg to 3 mg weekly. Discussed she needs to come off Januvia, but she does not wish to do this until she is under better control. Discussed she can discuss further with our clinical pharmacist at their follow-up. - Home glucose monitoring: continue using freestyle libre  - Patient is currently taking a statin. Patient is taking an ACE-inhibitor/ARB.   Diabetes Health Maintenance Due  Topic Date Due   FOOT EXAM  03/29/2022   HEMOGLOBIN A1C  04/28/2022   OPHTHALMOLOGY EXAM  07/26/2022    Lab Results  Component Value Date   LABMICR See below: 01/14/2022   LABMICR See below: 04/18/2021   - Lipid panel - CBC with Differential/Platelet - CMP14+EGFR - Bayer DCA Hb A1c Waived - Microalbumin / creatinine urine ratio - lisinopril (ZESTRIL) 5 MG tablet; Take 1 tablet (5 mg total) by mouth daily.  Dispense: 90 tablet; Refill: 1 - Dulaglutide (TRULICITY) 3 FT/7.3UK SOPN; Inject 3 mg as directed once a week.  Dispense: 2 mL; Refill: 2  5-6. Polyneuropathy due to type 2 diabetes mellitus (HCC)/Controlled substance agreement  signed Well controlled on current regimen. Controlled substance agreement in place and updated today. Urine drug screen as expected. PDMP reviewed with no concerning findings.  - CMP14+EGFR - pregabalin (LYRICA) 150 MG capsule; Take 1 capsule (150 mg total) by mouth 3 (three) times daily.  Dispense: 90 capsule; Refill: 5  7. Stress incontinence of urine Well controlled on current regimen.  - CMP14+EGFR - oxybutynin (DITROPAN XL) 15 MG 24 hr tablet; Take 1 tablet (15 mg total) by mouth at bedtime.  Dispense: 90 tablet; Refill: 1  8. COPD without exacerbation ([place]) Well controlled on current regimen.   9. OSA (obstructive sleep apnea) Continue wearing CPAP.  10. Gastroesophageal reflux disease, unspecified whether esophagitis present Well controlled on current regimen.  - CMP14+EGFR - lansoprazole (PREVACID) 30 MG capsule; Take 1 capsule (30 mg total) by mouth daily in the afternoon.  Dispense: 90 capsule; Refill: 1  11. Seasonal allergies Well controlled on current regimen.   12. Post-menopausal - DG [facility] DEXA; Future   Return in about 4 weeks (around 03/06/2022) for DM with Almy[facility] Free and 3 months with [facility] or [place].  [name], MSN, APRN, FNP-C [facility]  Subjective:    [name], female    DOB: 1956/02/27, 66 y.o.   MRN: [id number]  Patient Care Team: Loman Brooklyn, FNP as PCP - General (Family Medicine) Suella Broad, MD as Consulting Physician (Physical Medicine and Rehabilitation) Norma Fredrickson, MD as Consulting Physician (Psychiatry) Phillips Odor, MD as Consulting Physician (Neurology) [place],  Royce Macadamia, [place] (Pharmacist) Warren Danes, PA-C as Physician Assistant (Dermatology) Madelin Headings, DO (Optometry)   Chief Complaint:  Chief Complaint  Patient presents with   Medical Management of Chronic Issues    BS has been running high     HPI: Cynthia Deleon is a 66 y.o. female presenting on 02/06/2022 for  Medical Management of Chronic Issues (BS has been running high )  Diabetes with hypertension: Patient presents for follow up of diabetes. Current symptoms include: hyperglycemia and paresthesia of the feet. Known diabetic complications: peripheral neuropathy and cardiovascular disease. She is taking Lyrica for the neuropathy. Medication compliance: yes. Current diet: in general, a "healthy" diet  . Current exercise: none. Home blood sugar records: BGs are running  consistent with Hgb A1C.  She is consistently using the freestyle libre. Is she  on ACE inhibitor or angiotensin II receptor blocker? Yes (Lisinopril). Is she on a statin? Yes (Atorvastatin).   Aortic Atherosclerosis: taking Atorvastatin, but no aspirin. Noted on Chest CT in November 2020.  Chronic venous insufficiency: Established with a vascular specialist who recommended compression stockings to help with edema.   GERD: taking lansoprazole daily.   Seasonal allergies: taking Singulair and Flonase.  COPD: taking Trelegy daily and is rarely requiring Albuterol.   OSA: wears CPAP with oxygen nightly. This is managed by Dr. Merlene Laughter. She gets her supplies from [employer].   Stress Incontinence of Urine: taking Ditropan XL. Patient has previously failed therapy with darifenacin and solifenacin. Tolterodine was not covered by insurance when it was sent in previously.  Bipolar/PTSD/OCD: She is managed by psychiatrist, Dr. Casimiro Needle.     02/06/2022   11:00 AM 12/07/2021    1:31 PM 09/13/2021   10:43 AM  Depression screen PHQ 2/9  Decreased Interest 2 0 0  Down, Depressed, Hopeless 2 0 0  PHQ - 2 Score 4 0 0  Altered sleeping 0 0 1  Tired, decreased energy 2 0 1  Change in appetite 3 0 0  Feeling bad or failure about yourself  2 0 0  Trouble concentrating 3 0 0  Moving slowly or fidgety/restless 0 0 0  Suicidal thoughts 0 0 0  PHQ-9 Score 14 0 2  Difficult doing work/chores Not difficult at all Not difficult at all Not  difficult at all      02/06/2022   11:00 AM 12/07/2021    1:32 PM 09/13/2021   10:43 AM 08/30/2021    9:39 AM  GAD 7 : Generalized Anxiety Score  Nervous, Anxious, on Edge _0 Control/stop worrying 2 0 1 3  Worry too much - different things 2 0 1 3  Trouble relaxing 0 0 1 3  Restless 2 0 0 2  Easily annoyed or irritable 2 0 0 0  Afraid - awful might happen 2 0 2 0  Total GAD 7 Score _1 Anxiety Difficulty Not difficult at all Not difficult at all Not difficult at all Not difficult at all    New complaints: None   Social history:  Relevant past medical, surgical, family and social history reviewed and updated as indicated. Interim medical history since our last visit reviewed.  Allergies and medications reviewed and updated.  DATA REVIEWED: CHART IN EPIC  ROS: Negative unless specifically indicated above in HPI.    Current Outpatient Medications:    albuterol (VENTOLIN HFA) 108 (90 Base) MCG/ACT inhaler, Inhale 2 puffs into the lungs every 6 (six) hours  as needed. (Patient taking differently: Inhale 2 puffs into the lungs every 6 (six) hours as needed for wheezing or shortness of breath.), Disp: 18 g, Rfl: 2   atorvastatin (LIPITOR) 80 MG tablet, Take 1 tablet (80 mg total) by mouth daily., Disp: 90 tablet, Rfl: 3   cholecalciferol (VITAMIN D) 25 MCG tablet, Take 1 tablet (1,000 Units total) by mouth daily., Disp: , Rfl:    diazepam (VALIUM) 5 MG tablet, Take 5 mg by mouth 2 (two) times daily., Disp: , Rfl:    Dulaglutide (TRULICITY) 1.5 QV/9.5GL SOPN, Inject 1.5 mg into the skin once a week., Disp: 2 mL, Rfl: 4   DULoxetine (CYMBALTA) 60 MG capsule, Take 1 capsule (60 mg total) by mouth daily., Disp: 30 capsule, Rfl: 11   fluorometholone (FML) 0.1 % ophthalmic suspension, SMARTSIG:In Eye(s), Disp: , Rfl:    fluticasone (FLONASE) 50 MCG/ACT nasal spray, USE 2 SPRAYS IN EACH NOSTRIL ONCE DAILY. (Patient taking differently: Place 2 sprays into both nostrils daily.),  Disp: 16 g, Rfl: 0   Fluticasone-Umeclidin-Vilant (TRELEGY ELLIPTA) 100-62.5-25 MCG/ACT AEPB, Inhale 1 puff into the lungs daily., Disp: 28 each, Rfl: 5   glipiZIDE (GLUCOTROL XL) 5 MG 24 hr tablet, Take 1 tablet (5 mg total) by mouth 2 (two) times daily with a meal., Disp: 60 tablet, Rfl: 5   glucose blood (CONTOUR NEXT TEST) test strip, Test BS 4 times daily Dx E11.9, Disp: 400 strip, Rfl: 3   guaiFENesin-dextromethorphan (ROBITUSSIN DM) 100-10 MG/5ML syrup, Take 5 mLs by mouth every 4 (four) hours as needed for cough., Disp: 118 mL, Rfl: 0   lamoTRIgine (LAMICTAL) 200 MG tablet, Take 200 mg by mouth at bedtime. For mood disorder., Disp: , Rfl:    lansoprazole (PREVACID) 30 MG capsule, TAKE (1) CAPSULE BY MOUTH ONCE DAILY., Disp: 30 capsule, Rfl: 0   lisinopril (ZESTRIL) 5 MG tablet, TAKE (1) TABLET BY MOUTH ONCE DAILY., Disp: 30 tablet, Rfl: 0   meclizine (ANTIVERT) 25 MG tablet, Take 2 tablets (50 mg total) by mouth daily as needed for dizziness., Disp: 60 tablet, Rfl: 2   montelukast (SINGULAIR) 10 MG tablet, TAKE (1) TABLET BY MOUTH AT BEDTIME., Disp: 90 tablet, Rfl: 1   OneTouch Delica Lancets 87F MISC, Use to test blood sugar twice daily. DX: E11.9, Disp: 100 each, Rfl: 3   oxybutynin (DITROPAN XL) 15 MG 24 hr tablet, Take 1 tablet (15 mg total) by mouth at bedtime., Disp: 30 tablet, Rfl: 0   oxyCODONE (ROXICODONE) 15 MG immediate release tablet, Take 1 tablet (15 mg total) by mouth every 6 (six) hours as needed for pain., Disp: 10 tablet, Rfl: 0   pregabalin (LYRICA) 150 MG capsule, Take 1 capsule (150 mg total) by mouth 3 (three) times daily., Disp: 90 capsule, Rfl: 2   PREMARIN 0.3 MG tablet, TAKE (1) TABLET BY MOUTH ONCE DAILY. (Patient taking differently: Take 0.3 mg by mouth daily.), Disp: 30 tablet, Rfl: 5   promethazine (PHENERGAN) 12.5 MG tablet, Take 1 tablet (12.5 mg total) by mouth every 8 (eight) hours as needed for nausea or vomiting., Disp: 20 tablet, Rfl: 0   sitaGLIPtin  (JANUVIA) 100 MG tablet, Take 1 tablet (100 mg total) by mouth daily. TAKE (1) TABLET BY MOUTH ONCE DAILY. Strength: 100 mg, Disp: 30 tablet, Rfl: 3   valACYclovir (VALTREX) 500 MG tablet, TAKE (1) TABLET BY MOUTH ONCE DAILY AS NEEDED FOR SHINGLES FLARE UP. (Patient taking differently: Take 500 mg by mouth as needed (shingle flare-up).), Disp:  30 tablet, Rfl: 0   vitamin B-12 1000 MCG tablet, Take 1 tablet (1,000 mcg total) by mouth daily., Disp: , Rfl:    Allergies  Allergen Reactions   Chantix [Varenicline] Other (See Comments)    Altered mental status-Per patient was put on allergy list by Dr Lorriane Shire bu patient staes she is not allergic to this and is currently taking it.    Divalproex Sodium Other (See Comments)    Hallucinations  Other reaction(s): Confusion   Other Anaphylaxis   Pseudoeph-Hydrocodone-Gg Nausea Only   Sulfa Antibiotics Anaphylaxis, Nausea Only, Swelling and Other (See Comments)    Swelling of tongue.   Varenicline Tartrate Other (See Comments)    Other reaction(s): Confusion   Ambien [Zolpidem] Other (See Comments)    Causes sleep walking   Clavulanic Acid Diarrhea   Codeine Nausea And Vomiting and Other (See Comments)   Elemental Sulfur Other (See Comments)    Swelling of tongue   Hydrocod Poli-Chlorphe Poli Er Other (See Comments)    Other reaction(s): Skin itches    Hydrocodone Nausea And Vomiting   Macrobid [Nitrofurantoin] Nausea And Vomiting    Dizziness, nausea, vomiting   Paxil [Paroxetine Hcl] Other (See Comments)    Causes ringing in the ears.    Prednisone Other (See Comments)    Other reaction(s): Unknown    Amoxicillin Rash    Has patient had a PCN reaction causing immediate rash, facial/tongue/throat swelling, SOB or lightheadedness with hypotension: No Has patient had a PCN reaction causing severe rash involving mucus membranes or skin necrosis: No Has patient had a PCN reaction that required hospitalization: No Has patient had a PCN  reaction occurring within the last 10 years: Yes If all of the above answers are "NO", then may proceed with Cephalosporin use.    Farxiga [Dapagliflozin]     RECURRENT YEAST/UTI   Metformin And Related Diarrhea   Tape Rash   Past Medical History:  Diagnosis Date   Aortic atherosclerosis ([place]) 10/31/2021   Arthritis    hands and knees   Bipolar 1 disorder (HCC)    Borderline diabetic    Chronic back pain    Chronic neck pain    Complication of anesthesia    high anxiety-does not want to be alone   COPD (chronic obstructive pulmonary disease) ([place])    Current use of estrogen therapy 01/12/2014   Depression    Diabetes mellitus without complication (HCC)    Diplopia    Family history of adverse reaction to anesthesia    sister "gas in lungs"   GERD (gastroesophageal reflux disease)    Headache    Hepatic steatosis 10/31/2021   Hot flashes 12/15/2013   Hypertension    IBS (irritable bowel syndrome)    Incontinence    Kidney stone    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[place])    Panic attacks    Peptic ulcer    Peripheral neuropathy    Rosacea    Shingles    Sleep apnea    uses a cpap-with oxygen    Past Surgical History:  Procedure Laterality Date   BIOPSY  04/27/2020   Procedure: BIOPSY;  Surgeon: Ronald Lobo, MD;  Location: [facility];  Service: Endoscopy;;   BUNIONECTOMY WITH HAMMERTOE RECONSTRUCTION Right 12/10/2012   Procedure: RIGHT FIRST METATARSAL CHEVRON BUNION CORRECTION,  2 AND 3 HAMMERTOE CORRECTION , RIGHT 3 AND 4 TOE NAIL EXCISION ;  Surgeon: Wylene Simmer, MD;  Location: [facility];  Service: Orthopedics;  Laterality: Right;   COLONOSCOPY WITH PROPOFOL N/A 04/27/2020   Procedure: COLONOSCOPY WITH PROPOFOL;  Surgeon: Ronald Lobo, MD;  Location: [facility];  Service: Endoscopy;  Laterality: N/A;   FOOT ARTHRODESIS  2000   both feet   LIPOSUCTION  03/2018   RECTAL SURGERY     TONSILLECTOMY     TOTAL ABDOMINAL HYSTERECTOMY      Social  History   Socioeconomic History   Marital status: Divorced    Spouse name: Not on file   Number of children: 5   Years of education: 9th   Highest education level: Not on file  Occupational History   Occupation: Disabled  Tobacco Use   Smoking status: Former    Packs/day: 1.00    Years: 42.00    Total pack years: 42.00    Types: Cigarettes    Quit date: 06/10/2017    Years since quitting: 4.6   Smokeless tobacco: Never  Vaping Use   Vaping Use: Never used  Substance and Sexual Activity   Alcohol use: No    Alcohol/week: 0.0 standard drinks of alcohol   Drug use: No   Sexual activity: Never    Birth control/protection: Surgical    Comment: occ cigarette  Other Topics Concern   Not on file  Social History Narrative   Lives at home with home with her son (has five children).   Right-handed.   1 cup caffeine per day.   Social Determinants of Health   Financial Resource Strain: Not on file  Food Insecurity: No Food Insecurity (01/07/2020)   Hunger Vital Sign    Worried About Running Out of Food in the Last Year: Never true    Ran Out of Food in the Last Year: Never true  Transportation Needs: No Transportation Needs (01/07/2020)   PRAPARE - Hydrologist (Medical): No    Lack of Transportation (Non-Medical): No  Physical Activity: Not on file  Stress: Not on file  Social Connections: Not on file  Intimate Partner Violence: Not on file        Objective:    BP 111/70   Pulse 98   Temp 97.8 F (36.6 C) (Temporal)   Ht _0  (1.753 m)   Wt 173 lb 6.4 oz (78.7 kg)   SpO2 93%   BMI 25.61 kg/m   Wt Readings from Last 3 Encounters:  02/06/22 173 lb 6.4 oz (78.7 kg)  01/25/22 179 lb 12.8 oz (81.6 kg)  12/19/21 176 lb 3.2 oz (79.9 kg)    Physical Exam Vitals reviewed.  Constitutional:      General: She is not in acute distress.    Appearance: Normal appearance. She is normal weight. She is not ill-appearing, toxic-appearing or  diaphoretic.  HENT:     Head: Normocephalic and atraumatic.  Eyes:     General: No scleral icterus.       Right eye: No discharge.        Left eye: No discharge.     Conjunctiva/sclera: Conjunctivae normal.  Cardiovascular:     Rate and Rhythm: Normal rate and regular rhythm.     Heart sounds: Normal heart sounds. No murmur heard.    No friction rub. No gallop.  Pulmonary:     Effort: Pulmonary effort is normal. No respiratory distress.     Breath sounds: Normal breath sounds. No stridor. No wheezing, rhonchi or rales.  Musculoskeletal:        General: Normal range of motion.  Cervical back: Normal range of motion.  Skin:    General: Skin is warm and dry.     Capillary Refill: Capillary refill takes less than 2 seconds.  Neurological:     General: No focal deficit present.     Mental Status: She is alert and oriented to person, place, and time. Mental status is at baseline.  Psychiatric:        Mood and Affect: Mood normal.        Behavior: Behavior normal.        Thought Content: Thought content normal.        Judgment: Judgment normal.     Lab Results  Component Value Date   TSH 0.478 10/26/2021   Lab Results  Component Value Date   WBC 9.0 11/22/2021   HGB 14.3 11/22/2021   HCT 45.3 11/22/2021   MCV 98.9 11/22/2021   PLT 190 11/22/2021   Lab Results  Component Value Date   NA 136 11/22/2021   K 4.3 11/22/2021   CO2 29 11/22/2021   GLUCOSE 185 (H) 11/22/2021   BUN 15 11/22/2021   CREATININE 0.83 11/22/2021   BILITOT 1.0 11/21/2021   ALKPHOS 85 11/21/2021   AST 20 11/21/2021   ALT 13 11/21/2021   PROT 7.3 11/21/2021   ALBUMIN 4.3 11/21/2021   CALCIUM 9.0 11/22/2021   ANIONGAP 6 11/22/2021   EGFR 83 03/29/2021   Lab Results  Component Value Date   CHOL 169 03/29/2021   Lab Results  Component Value Date   HDL 52 03/29/2021   Lab Results  Component Value Date   LDLCALC 96 03/29/2021   Lab Results  Component Value Date   TRIG 118 03/29/2021    Lab Results  Component Value Date   CHOLHDL 3.3 03/29/2021   Lab Results  Component Value Date   HGBA1C 7.3 (H) 10/26/2021

## 2022-02-07 ENCOUNTER — Other Ambulatory Visit: Payer: Self-pay | Admitting: Family Medicine

## 2022-02-07 DIAGNOSIS — E782 Mixed hyperlipidemia: Secondary | ICD-10-CM

## 2022-02-07 DIAGNOSIS — E1165 Type 2 diabetes mellitus with hyperglycemia: Secondary | ICD-10-CM | POA: Diagnosis not present

## 2022-02-07 LAB — LIPID PANEL
Chol/HDL Ratio: 4.2 ratio (ref 0.0–4.4)
Cholesterol, Total: 213 mg/dL — ABNORMAL HIGH (ref 100–199)
HDL: 51 mg/dL (ref >39)
LDL Chol Calc (NIH): 143 mg/dL — ABNORMAL HIGH (ref 0–99)
Triglycerides: 107 mg/dL (ref 0–149)
VLDL Cholesterol Cal: 19 mg/dL (ref 5–40)

## 2022-02-07 LAB — CBC WITH DIFFERENTIAL/PLATELET
Basophils Absolute: 0.1 10*3/uL (ref 0.0–0.2)
Basos: 0 %
EOS (ABSOLUTE): 0 10*3/uL (ref 0.0–0.4)
Eos: 0 %
Hematocrit: 48.1 % — ABNORMAL HIGH (ref 34.0–46.6)
Hemoglobin: 17 g/dL — ABNORMAL HIGH (ref 11.1–15.9)
Immature Grans (Abs): 0 10*3/uL (ref 0.0–0.1)
Immature Granulocytes: 0 %
Lymphocytes Absolute: 2.2 10*3/uL (ref 0.7–3.1)
Lymphs: 15 %
MCH: 32.2 pg (ref 26.6–33.0)
MCHC: 35.3 g/dL (ref 31.5–35.7)
MCV: 91 fL (ref 79–97)
Monocytes Absolute: 0.9 10*3/uL (ref 0.1–0.9)
Monocytes: 6 %
Neutrophils Absolute: 11.3 10*3/uL — ABNORMAL HIGH (ref 1.4–7.0)
Neutrophils: 79 %
Platelets: 268 10*3/uL (ref 150–450)
RBC: 5.28 x10E6/uL (ref 3.77–5.28)
RDW: 12.6 % (ref 11.7–15.4)
WBC: 14.6 10*3/uL — ABNORMAL HIGH (ref 3.4–10.8)

## 2022-02-07 LAB — CMP14+EGFR
ALT: 11 IU/L (ref 0–32)
AST: 13 IU/L (ref 0–40)
Albumin/Globulin Ratio: 2 (ref 1.2–2.2)
Albumin: 4.6 g/dL (ref 3.9–4.9)
Alkaline Phosphatase: 74 IU/L (ref 44–121)
BUN/Creatinine Ratio: 16 (ref 12–28)
BUN: 14 mg/dL (ref 8–27)
Bilirubin Total: 0.5 mg/dL (ref 0.0–1.2)
CO2: 25 mmol/L (ref 20–29)
Calcium: 11 mg/dL — ABNORMAL HIGH (ref 8.7–10.3)
Chloride: 101 mmol/L (ref 96–106)
Creatinine, Ser: 0.86 mg/dL (ref 0.57–1.00)
Globulin, Total: 2.3 g/dL (ref 1.5–4.5)
Glucose: 109 mg/dL — ABNORMAL HIGH (ref 70–99)
Potassium: 4.8 mmol/L (ref 3.5–5.2)
Sodium: 142 mmol/L (ref 134–144)
Total Protein: 6.9 g/dL (ref 6.0–8.5)
eGFR: 74 mL/min/{1.73_m2} (ref >59)

## 2022-02-07 MED ORDER — EZETIMIBE 10 MG PO TABS: 10.0000 mg | ORAL_TABLET | Freq: Every day | ORAL | 2 refills | Status: DC

## 2022-02-08 ENCOUNTER — Other Ambulatory Visit: Payer: Self-pay | Admitting: Family Medicine

## 2022-02-08 ENCOUNTER — Telehealth: Payer: Self-pay | Admitting: Pharmacist

## 2022-02-08 DIAGNOSIS — Z1211 Encounter for screening for malignant neoplasm of colon: Secondary | ICD-10-CM

## 2022-02-08 DIAGNOSIS — Z8619 Personal history of other infectious and parasitic diseases: Secondary | ICD-10-CM

## 2022-02-08 LAB — MICROALBUMIN / CREATININE URINE RATIO
Creatinine, Urine: 101.6 mg/dL
Microalb/Creat Ratio: 20 mg/g creat (ref 0–29)
Microalbumin, Urine: 20.8 ug/mL

## 2022-02-08 NOTE — Telephone Encounter (Signed)
Patient aware and states that Dr. Cristina Gong retired.  She would like to go to the place in [place] and Almyra Free knows the name?

## 2022-02-08 NOTE — Telephone Encounter (Signed)
It looks like the patient had her most recent colonoscopy in 2021 with Dr. Cristina Gong at [facility] gastroenterology.  She would not need a referral to go back to them.  She can call them to see when she needs to return for a repeat colonoscopy. [phone]

## 2022-02-08 NOTE — Telephone Encounter (Signed)
Called pharmacy to adjust patient's pill packaging  Patient requesting referral to GI for colonoscopy Will route to PCP

## 2022-02-08 NOTE — Telephone Encounter (Signed)
GI offices generally do no accept patients from other GI offices, but we can try. Cynthia Deleon - who did y'all discuss?

## 2022-02-12 ENCOUNTER — Telehealth: Payer: Self-pay | Admitting: Pulmonary Disease

## 2022-02-12 NOTE — Telephone Encounter (Signed)
Called and spoke to patient. She states that her breathing is getting worse and that she is having to gasp for air more frequently since she saw Dr. Halford Chessman at last ov. She states she discussed this with him at last ov. Patient is concerned and wants to know what she can try while waiting on getting O2, she also wants to know if this will affect her heart. Denies any wheezing, coughing, fevers.   Please advise.

## 2022-02-12 NOTE — Telephone Encounter (Signed)
Her symptoms could be coming from heart disease and she should follow through with plan from cardiology appointment in August.    She should continue to use her inhalers.    We can bring her into the office to do an ambulatory oximetry check to see if she qualifies for daytime supplemental oxygen.

## 2022-02-12 NOTE — Telephone Encounter (Signed)
Patient prefers dr. Gala Romney in [place], [place]

## 2022-02-12 NOTE — Telephone Encounter (Signed)
Referral placed.

## 2022-02-12 NOTE — Telephone Encounter (Signed)
Called and went over recs with patient. She voiced understanding and  states she will ask for a walk when she comes in for her appt in November. Nothing further needed

## 2022-02-12 NOTE — Addendum Note (Signed)
Addended by: [name] F on: 02/12/2022 02:59 PM   Modules accepted: Orders

## 2022-02-15 ENCOUNTER — Ambulatory Visit (HOSPITAL_COMMUNITY): Payer: Medicare Other | Attending: Cardiovascular Disease

## 2022-02-15 DIAGNOSIS — I10 Essential (primary) hypertension: Secondary | ICD-10-CM | POA: Diagnosis not present

## 2022-02-15 LAB — ECHOCARDIOGRAM COMPLETE
Area-P 1/2: 3.89 cm2
S' Lateral: 2 cm

## 2022-02-25 DIAGNOSIS — G4733 Obstructive sleep apnea (adult) (pediatric): Secondary | ICD-10-CM | POA: Diagnosis not present

## 2022-02-26 DIAGNOSIS — M5412 Radiculopathy, cervical region: Secondary | ICD-10-CM | POA: Diagnosis not present

## 2022-03-07 ENCOUNTER — Ambulatory Visit (INDEPENDENT_AMBULATORY_CARE_PROVIDER_SITE_OTHER): Payer: Medicare Other | Admitting: *Deleted

## 2022-03-07 ENCOUNTER — Ambulatory Visit: Payer: Medicare Other | Admitting: Family Medicine

## 2022-03-07 DIAGNOSIS — Z23 Encounter for immunization: Secondary | ICD-10-CM

## 2022-03-10 DIAGNOSIS — E1165 Type 2 diabetes mellitus with hyperglycemia: Secondary | ICD-10-CM | POA: Diagnosis not present

## 2022-03-12 ENCOUNTER — Ambulatory Visit: Payer: Medicare Other | Admitting: Pharmacist

## 2022-03-19 ENCOUNTER — Ambulatory Visit: Payer: Medicare Other | Attending: Pulmonary Disease | Admitting: Pulmonary Disease

## 2022-03-19 DIAGNOSIS — G4733 Obstructive sleep apnea (adult) (pediatric): Secondary | ICD-10-CM | POA: Diagnosis not present

## 2022-03-19 DIAGNOSIS — E119 Type 2 diabetes mellitus without complications: Secondary | ICD-10-CM | POA: Insufficient documentation

## 2022-03-20 ENCOUNTER — Other Ambulatory Visit: Payer: Self-pay | Admitting: Family Medicine

## 2022-03-20 DIAGNOSIS — Z8619 Personal history of other infectious and parasitic diseases: Secondary | ICD-10-CM

## 2022-03-20 DIAGNOSIS — Z79899 Other long term (current) drug therapy: Secondary | ICD-10-CM

## 2022-03-20 DIAGNOSIS — N952 Postmenopausal atrophic vaginitis: Secondary | ICD-10-CM

## 2022-03-27 ENCOUNTER — Telehealth: Payer: Self-pay | Admitting: Family Medicine

## 2022-03-27 DIAGNOSIS — E11649 Type 2 diabetes mellitus with hypoglycemia without coma: Secondary | ICD-10-CM

## 2022-03-27 DIAGNOSIS — G4733 Obstructive sleep apnea (adult) (pediatric): Secondary | ICD-10-CM | POA: Diagnosis not present

## 2022-03-27 DIAGNOSIS — E1142 Type 2 diabetes mellitus with diabetic polyneuropathy: Secondary | ICD-10-CM

## 2022-03-27 MED ORDER — FREESTYLE LIBRE 2 SENSOR MISC: 11 refills | Status: DC

## 2022-03-27 NOTE — Telephone Encounter (Signed)
Left detailed message - What Cynthia Deleon is she on? Not on medication list   LMTCB

## 2022-03-27 NOTE — Addendum Note (Signed)
Addended by: Lottie Dawson D on: 03/27/2022 11:53 AM   Modules accepted: Orders

## 2022-03-27 NOTE — Telephone Encounter (Signed)
Patient needs a new prescription for her [name]. Please call back if there are any issues sending

## 2022-03-27 NOTE — Telephone Encounter (Signed)
Call returned to patient  Will send libre 2 refills to [facility] Will start sliding scale insulin to meet requirements Continue current therapy Will schedule pharmD visit to review new CGM requirements 10/31 Samples left up front

## 2022-03-28 NOTE — Telephone Encounter (Signed)
Pt came in for samples and asked to talk to [place]. She did not give a message. Please call back

## 2022-03-28 NOTE — Telephone Encounter (Signed)
Attempted to contact patient - NA  Need more information - please ask when she calls back

## 2022-03-28 NOTE — Telephone Encounter (Signed)
Pt would not give message. She has apt tomorrow and will talk to nurse about it.

## 2022-03-29 ENCOUNTER — Ambulatory Visit (INDEPENDENT_AMBULATORY_CARE_PROVIDER_SITE_OTHER): Payer: Medicare Other | Admitting: Family Medicine

## 2022-03-29 ENCOUNTER — Encounter: Payer: Self-pay | Admitting: Family Medicine

## 2022-03-29 VITALS — BP 139/83 | HR 90 | Temp 97.3°F | Ht 69.0 in | Wt 175.4 lb

## 2022-03-29 DIAGNOSIS — Z79899 Other long term (current) drug therapy: Secondary | ICD-10-CM | POA: Insufficient documentation

## 2022-03-29 DIAGNOSIS — J301 Allergic rhinitis due to pollen: Secondary | ICD-10-CM

## 2022-03-29 DIAGNOSIS — E11649 Type 2 diabetes mellitus with hypoglycemia without coma: Secondary | ICD-10-CM | POA: Diagnosis not present

## 2022-03-29 MED ORDER — LEVOCETIRIZINE DIHYDROCHLORIDE 5 MG PO TABS: 5.0000 mg | ORAL_TABLET | Freq: Every evening | ORAL | 6 refills | Status: DC

## 2022-03-29 NOTE — Progress Notes (Signed)
Subjective:  [name], female    DOB: 07-06-1955, 66 y.o.   MRN: [id number]  Patient Care Team: Baruch Gouty, FNP as PCP - General (Family Medicine) Suella Broad, MD as Consulting Physician (Physical Medicine and Rehabilitation) Norma Fredrickson, MD as Consulting Physician (Psychiatry) Phillips Odor, MD as Consulting Physician (Neurology) Lavera Guise, [facility] (Pharmacist) Warren Danes, PA-C as Physician Assistant (Dermatology) Madelin Headings, DO (Optometry)   Chief Complaint:  Establish Care [facility] patient. States BS has been running high )   HPI: Cynthia Deleon is a 66 y.o. female presenting on 03/29/2022 for [facility] patient. States BS has been running high )   Pt presents today to establish care with new PCP as her is not longer at this office. She has a complex medical history and is on several medications. She is followed by psychiatry and pain management on a regular basis. Her biggest concern today is rhinorrhea, congestion, and postnasal drainage. States this is worse with seasonal changes, no associated fever, chills, weakness, shortness of breath, or fatigue. States her blood sugars have been running high over the last several weeks. She has an appointment with the Pharm D today to discuss medications. She would like to simplify her medication regimen.      Relevant past medical, surgical, family, and social history reviewed and updated as indicated.  Allergies and medications reviewed and updated. Data reviewed: Chart in Epic.   Past Medical History:  Diagnosis Date   Aortic atherosclerosis ([place]) 10/31/2021   Arthritis    hands and knees   Bipolar 1 disorder (HCC)    Borderline diabetic    Chronic back pain    Chronic neck pain    Complication of anesthesia    high anxiety-does not want to be alone   COPD (chronic obstructive pulmonary disease) (HCC)    Current use of estrogen therapy 01/12/2014   Depression     Diabetes mellitus without complication (HCC)    Diplopia    Family history of adverse reaction to anesthesia    sister "gas in lungs"   GERD (gastroesophageal reflux disease)    Headache    Hepatic steatosis 10/31/2021   Hot flashes 12/15/2013   Hypertension    IBS (irritable bowel syndrome)    Incontinence    Kidney stone    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[place])    Osteopenia 02/06/2022   Panic attacks    Peptic ulcer    Peripheral neuropathy    Rosacea    Shingles    Sleep apnea    uses a cpap-with oxygen    Past Surgical History:  Procedure Laterality Date   BIOPSY  04/27/2020   Procedure: BIOPSY;  Surgeon: Ronald Lobo, MD;  Location: [facility];  Service: Endoscopy;;   BUNIONECTOMY WITH HAMMERTOE RECONSTRUCTION Right 12/10/2012   Procedure: RIGHT FIRST METATARSAL CHEVRON BUNION CORRECTION,  2 AND 3 HAMMERTOE CORRECTION , RIGHT 3 AND 4 TOE NAIL EXCISION ;  Surgeon: Wylene Simmer, MD;  Location: [place];  Service: Orthopedics;  Laterality: Right;   COLONOSCOPY WITH PROPOFOL N/A 04/27/2020   Procedure: COLONOSCOPY WITH PROPOFOL;  Surgeon: Ronald Lobo, MD;  Location: [facility];  Service: Endoscopy;  Laterality: N/A;   FOOT ARTHRODESIS  2000   both feet   LIPOSUCTION  03/2018   RECTAL SURGERY     TONSILLECTOMY     TOTAL ABDOMINAL HYSTERECTOMY      Social History   Socioeconomic History  Marital status: Divorced    Spouse name: Not on file   Number of children: 5   Years of education: 9th   Highest education level: Not on file  Occupational History   Occupation: Disabled  Tobacco Use   Smoking status: Former    Packs/day: 1.00    Years: 42.00    Total pack years: 42.00    Types: Cigarettes    Quit date: 06/10/2017    Years since quitting: 4.8   Smokeless tobacco: Never  Vaping Use   Vaping Use: Never used  Substance and Sexual Activity   Alcohol use: No    Alcohol/week: 0.0 standard drinks of alcohol   Drug use: No   Sexual  activity: Never    Birth control/protection: Surgical    Comment: occ cigarette  Other Topics Concern   Not on file  Social History Narrative   Lives at home with home with her son (has five children).   Right-handed.   1 cup caffeine per day.   Social Determinants of Health   Financial Resource Strain: Not on file  Food Insecurity: No Food Insecurity (01/07/2020)   Hunger Vital Sign    Worried About Running Out of Food in the Last Year: Never true    Ran Out of Food in the Last Year: Never true  Transportation Needs: No Transportation Needs (01/07/2020)   PRAPARE - Hydrologist (Medical): No    Lack of Transportation (Non-Medical): No  Physical Activity: Not on file  Stress: Not on file  Social Connections: Not on file  Intimate Partner Violence: Not on file    Outpatient Encounter Medications as of 03/29/2022  Medication Sig   albuterol (VENTOLIN HFA) 108 (90 Base) MCG/ACT inhaler Inhale 2 puffs into the lungs every 6 (six) hours as needed. (Patient taking differently: Inhale 2 puffs into the lungs every 6 (six) hours as needed for wheezing or shortness of breath.)   aspirin EC 81 MG tablet Take 81 mg by mouth daily. Swallow whole.   atorvastatin (LIPITOR) 80 MG tablet Take 1 tablet (80 mg total) by mouth daily.   cholecalciferol (VITAMIN D) 25 MCG tablet Take 1 tablet (1,000 Units total) by mouth daily.   Continuous Blood Gluc Sensor (FREESTYLE LIBRE 2 SENSOR) MISC Apply to arm every 14 days to check blood sugar continuously. DX E11.65. Fill at advanced diabetes supply via [facility] portal because medicare requirements   diazepam (VALIUM) 5 MG tablet Take 5 mg by mouth 2 (two) times daily.   Dulaglutide (TRULICITY) 3 XT/0.6YI SOPN Inject 3 mg as directed once a week.   DULoxetine (CYMBALTA) 60 MG capsule Take 1 capsule (60 mg total) by mouth daily.   estrogens, conjugated, (PREMARIN) 0.3 MG tablet TAKE (1) TABLET BY MOUTH ONCE DAILY.   ezetimibe  (ZETIA) 10 MG tablet Take 1 tablet (10 mg total) by mouth daily.   fluorometholone (FML) 0.1 % ophthalmic suspension SMARTSIG:In Eye(s)   fluticasone (FLONASE) 50 MCG/ACT nasal spray USE 2 SPRAYS IN EACH NOSTRIL ONCE DAILY. (Patient taking differently: Place 2 sprays into both nostrils daily.)   Fluticasone-Umeclidin-Vilant (TRELEGY ELLIPTA) 100-62.5-25 MCG/ACT AEPB Inhale 1 puff into the lungs daily.   glipiZIDE (GLUCOTROL XL) 5 MG 24 hr tablet Take 1 tablet (5 mg total) by mouth 2 (two) times daily with a meal.   glucose blood (CONTOUR NEXT TEST) test strip Test BS 4 times daily Dx E11.9   lamoTRIgine (LAMICTAL) 200 MG tablet Take 200 mg by mouth  at bedtime. For mood disorder.   lansoprazole (PREVACID) 30 MG capsule Take 1 capsule (30 mg total) by mouth daily in the afternoon.   levocetirizine (XYZAL) 5 MG tablet Take 1 tablet (5 mg total) by mouth every evening.   lisinopril (ZESTRIL) 5 MG tablet Take 1 tablet (5 mg total) by mouth daily.   meclizine (ANTIVERT) 25 MG tablet Take 2 tablets (50 mg total) by mouth daily as needed for dizziness.   montelukast (SINGULAIR) 10 MG tablet TAKE (1) TABLET BY MOUTH AT BEDTIME.   OneTouch Delica Lancets 54Y MISC Use to test blood sugar twice daily. DX: E11.9   oxybutynin (DITROPAN XL) 15 MG 24 hr tablet Take 1 tablet (15 mg total) by mouth at bedtime.   oxyCODONE (ROXICODONE) 15 MG immediate release tablet Take 1 tablet (15 mg total) by mouth every 6 (six) hours as needed for pain.   pregabalin (LYRICA) 150 MG capsule Take 1 capsule (150 mg total) by mouth 3 (three) times daily.   promethazine (PHENERGAN) 12.5 MG tablet Take 1 tablet (12.5 mg total) by mouth every 8 (eight) hours as needed for nausea or vomiting.   valACYclovir (VALTREX) 500 MG tablet TAKE (1) TABLET BY MOUTH ONCE DAILY AS NEEDED FOR SHINGLES FLARE UP.   vitamin B-12 1000 MCG tablet Take 1 tablet (1,000 mcg total) by mouth daily.   [DISCONTINUED] guaiFENesin-dextromethorphan (ROBITUSSIN  DM) 100-10 MG/5ML syrup Take 5 mLs by mouth every 4 (four) hours as needed for cough.   No facility-administered encounter medications on file as of 03/29/2022.    Allergies  Allergen Reactions   Chantix [Varenicline] Other (See Comments)    Altered mental status-Per patient was put on allergy list by Dr Lorriane Shire bu patient staes she is not allergic to this and is currently taking it.    Divalproex Sodium Other (See Comments)    Hallucinations  Other reaction(s): Confusion   Other Anaphylaxis   Pseudoeph-Hydrocodone-Gg Nausea Only   Sulfa Antibiotics Anaphylaxis, Nausea Only, Swelling and Other (See Comments)    Swelling of tongue.   Varenicline Tartrate Other (See Comments)    Other reaction(s): Confusion   Ambien [Zolpidem] Other (See Comments)    Causes sleep walking   Clavulanic Acid Diarrhea   Codeine Nausea And Vomiting and Other (See Comments)   Elemental Sulfur Other (See Comments)    Swelling of tongue   Hydrocod Poli-Chlorphe Poli Er Other (See Comments)    Other reaction(s): Skin itches    Hydrocodone Nausea And Vomiting   Macrobid [Nitrofurantoin] Nausea And Vomiting    Dizziness, nausea, vomiting   Paxil [Paroxetine Hcl] Other (See Comments)    Causes ringing in the ears.    Prednisone Other (See Comments)    Other reaction(s): Unknown    Amoxicillin Rash    Has patient had a PCN reaction causing immediate rash, facial/tongue/throat swelling, SOB or lightheadedness with hypotension: No Has patient had a PCN reaction causing severe rash involving mucus membranes or skin necrosis: No Has patient had a PCN reaction that required hospitalization: No Has patient had a PCN reaction occurring within the last 10 years: Yes If all of the above answers are "NO", then may proceed with Cephalosporin use.    [place] [Dapagliflozin]     RECURRENT YEAST/UTI   Metformin And Related Diarrhea   Tape Rash    Review of Systems  Constitutional:  Negative for activity change,  appetite change, chills, diaphoresis, fatigue, fever and unexpected weight change.  HENT:  Positive for  congestion, postnasal drip and rhinorrhea. Negative for dental problem, drooling, ear discharge, ear pain, facial swelling, hearing loss, mouth sores, nosebleeds, sinus pressure, sinus pain, sneezing, sore throat, tinnitus, trouble swallowing and voice change.   Eyes: Negative.  Negative for photophobia and visual disturbance.  Respiratory:  Negative for cough, chest tightness and shortness of breath.   Cardiovascular:  Negative for chest pain, palpitations and leg swelling.  Gastrointestinal:  Negative for abdominal pain, blood in stool, constipation, diarrhea, nausea and vomiting.  Endocrine: Negative.  Negative for polydipsia, polyphagia and polyuria.  Genitourinary:  Negative for decreased urine volume, difficulty urinating, dysuria, frequency and urgency.  Musculoskeletal:  Positive for arthralgias, back pain and myalgias. Negative for gait problem, joint swelling, neck pain and neck stiffness.  Skin: Negative.   Allergic/Immunologic: Negative.   Neurological:  Negative for dizziness, weakness and headaches.  Hematological: Negative.   Psychiatric/Behavioral:  Negative for confusion, hallucinations, sleep disturbance and suicidal ideas.   All other systems reviewed and are negative.       Objective:  BP 139/83   Pulse 90   Temp (!) 97.3 F (36.3 C) (Temporal)   Ht '5\' 9"'$  (1.753 m)   Wt 175 lb 6.4 oz (79.6 kg)   SpO2 93%   BMI 25.90 kg/m    Wt Readings from Last 3 Encounters:  03/29/22 175 lb 6.4 oz (79.6 kg)  02/06/22 173 lb 6.4 oz (78.7 kg)  01/25/22 179 lb 12.8 oz (81.6 kg)    Physical Exam Vitals and nursing note reviewed.  Constitutional:      General: She is not in acute distress.    Appearance: Normal appearance. She is normal weight. She is not ill-appearing, toxic-appearing or diaphoretic.  HENT:     Head: Normocephalic and atraumatic.     Nose: Nose normal.      Mouth/Throat:     Mouth: Mucous membranes are moist.  Eyes:     Conjunctiva/sclera: Conjunctivae normal.     Pupils: Pupils are equal, round, and reactive to light.  Cardiovascular:     Rate and Rhythm: Normal rate and regular rhythm.     Heart sounds: Normal heart sounds.  Pulmonary:     Effort: Pulmonary effort is normal.     Breath sounds: Normal breath sounds.  Musculoskeletal:     Right lower leg: No edema.     Left lower leg: No edema.  Skin:    General: Skin is warm and dry.     Capillary Refill: Capillary refill takes less than 2 seconds.  Neurological:     General: No focal deficit present.     Mental Status: She is alert and oriented to person, place, and time.     Gait: Gait normal.  Psychiatric:        Mood and Affect: Mood normal.        Behavior: Behavior normal.        Thought Content: Thought content normal.        Judgment: Judgment normal.     Results for orders placed or performed in visit on 02/15/22  ECHOCARDIOGRAM COMPLETE  Result Value Ref Range   Area-P 1/2 3.89 cm2   S' Lateral 2.00 cm       Pertinent labs & imaging results that were available during my care of the patient were reviewed by me and considered in my medical decision making.  Assessment & Plan:  Cynthia Deleon was seen today for establish care.  Diagnoses and all orders for this visit:  Type 2 diabetes mellitus with hypoglycemia without coma, without long-term current use of insulin (HCC) Reports high readings over the last several days. Has visit with Pharm D today to discuss medication adjustments.   Seasonal allergic rhinitis due to pollen Will add Xyzal nightly. Pt aware of symptomatic care at home.  -     levocetirizine (XYZAL) 5 MG tablet; Take 1 tablet (5 mg total) by mouth every evening.  Polypharmacy Discussed need to minimize medication regimen. Has follow up visit in 1 month for repeat labs, will make adjustments to medications at upcoming visit. Aware Lyrica will need  to be managed by her pain management provider.     Continue all other maintenance medications.  Follow up plan: Return in about 6 weeks (around 05/10/2022), or if symptoms worsen or fail to improve, for DM, labs.   Continue healthy lifestyle choices, including diet (rich in fruits, vegetables, and lean proteins, and low in salt and simple carbohydrates) and exercise (at least 30 minutes of moderate physical activity daily).   The above assessment and management plan was discussed with the patient. The patient verbalized understanding of and has agreed to the management plan. Patient is aware to call the clinic if they develop any new symptoms or if symptoms persist or worsen. Patient is aware when to return to the clinic for a follow-up visit. Patient educated on when it is appropriate to go to the emergency department.   Monia Pouch, FNP-C [facility] [phone]

## 2022-03-30 ENCOUNTER — Telehealth: Payer: Self-pay | Admitting: Pulmonary Disease

## 2022-03-30 DIAGNOSIS — G4733 Obstructive sleep apnea (adult) (pediatric): Secondary | ICD-10-CM

## 2022-03-30 NOTE — Procedures (Signed)
Patient Name: Cynthia Deleon, Khokhar Date: 03/19/2022 Gender: Female D.O.B: October 05, 1955 Age (years): 89 Referring Provider: Chesley Mires MD, ABSM Height (inches): 69 Interpreting Physician: Chesley Mires MD, ABSM Weight (lbs): 178 RPSGT: Rosebud Poles BMI: 26 MRN: [id number] Neck Size: 15.50  CLINICAL INFORMATION The patient is referred for a CPAP titration to treat sleep apnea.  Date of NPSG 04/30/21: AHI 6.5, SpO2 low 84%.   SLEEP STUDY TECHNIQUE As per the AASM Manual for the Scoring of Sleep and Associated Events v2.3 (April 2016) with a hypopnea requiring 4% desaturations.  The channels recorded and monitored were frontal, central and occipital EEG, electrooculogram (EOG), submentalis EMG (chin), nasal and oral airflow, thoracic and abdominal wall motion, anterior tibialis EMG, snore microphone, electrocardiogram, and pulse oximetry. Continuous positive airway pressure (CPAP) was initiated at the beginning of the study and titrated to treat sleep-disordered breathing.  MEDICATIONS Medications self-administered by patient taken the night of the study : N/A  TECHNICIAN COMMENTS Comments added by technician: Patient tolerated CPAP therapy very well. Titration started at 4 CWP, increasing to 7 CWP with good control of events. Therapy started w/o O2 supplement per Doctors' order. O2 supplement of 1 lpm, added due to sleep lab protocol. Please note Event comments; Patient asleep with eyes open. . CPAP titration increased from 6 CWP to 7 CWP to improve O2 levels of atleast 88%. . Increasing CPAP pressure did not improve O2 levels, eventually O2 supplement added Comments added by scorer: N/A  RESPIRATORY PARAMETERS Optimal PAP Pressure (cm): 7 AHI at Optimal Pressure (/hr): 0 Overall Minimal O2 (%): 81.00 Supine % at Optimal Pressure (%): 53 Minimal O2 at Optimal Pressure (%): 84.0   She continued to have oxygen desaturation below 88% in the abscence of other respiratory  events, and these last longer than 5 minutes.  This improved once she has 1 liter supplemental oxygen added to CPAP 7 cm H2O.  SLEEP ARCHITECTURE The study was initiated at 10:33:53 PM and ended at 5:06:23 AM.  Sleep onset time was 1.2 minutes and the sleep efficiency was 90.6%. The total sleep time was 355.8 minutes.  The patient spent 3.94% of the night in stage N1 sleep, 50.53% in stage N2 sleep, 38.09% in stage N3 and 7.5% in REM.Stage REM latency was 200.5 minutes  Wake after sleep onset was 35.5. Alpha intrusion was absent. Supine sleep was 38.37%.  CARDIAC DATA The 2 lead EKG demonstrated sinus rhythm. The mean heart rate was 79.64 beats per minute. Other EKG findings include: None.  LEG MOVEMENT DATA The total Periodic Limb Movements of Sleep (PLMS) were 0. The PLMS index was 0.00. A PLMS index of <15 is considered normal in adults.  IMPRESSIONS - The optimal PAP pressure was 7 cm of water. - She need 1 liter supplemental oxygen added to CPAP.  DIAGNOSIS - Obstructive Sleep Apnea  - Nocturnal Hypoxemia  RECOMMENDATIONS - Trial of CPAP therapy on 7 cm H2O with 1 liter supplemental oxygen. - She was fitted with a Medium size Resmed Nasal Cradle AirFit N30 mask and heated humidification. - Avoid alcohol, sedatives and other CNS depressants that may worsen sleep apnea and disrupt normal sleep architecture. - Sleep hygiene should be reviewed to assess factors that may improve sleep quality. - Weight management and regular exercise should be initiated or continued.  [Electronically signed] 03/30/2022 10:38 AM  Chesley Mires MD, ABSM Diplomate, American Board of Sleep Medicine NPI: [id number]  [place]: [phone]  FX: [phone] [place]

## 2022-03-30 NOTE — Telephone Encounter (Signed)
CPAP titration 03/19/22 >> CPAP 7 cm H2O with 1 liter oxygen >> AHI 0   Please let her know she did well with CPAP at 7 cm H2O with 1 liter oxygen.  Please make sure that [place] has her set up to use 1 liter oxygen at night with CPAP therapy.

## 2022-04-01 DIAGNOSIS — G4733 Obstructive sleep apnea (adult) (pediatric): Secondary | ICD-10-CM

## 2022-04-01 NOTE — Telephone Encounter (Signed)
ATC patient. Left a detailed vm on home phone vm (ok per dpr [phone]) letting patient know that she did well on CPAP and with 1LO2 and that we were ordering O2 for her to use with CPAP at night (1LO2). Advised pt on vm to call back if she needed anything/  had any questions or concerns. Nothing further needed at this time. Order placed.

## 2022-04-09 ENCOUNTER — Telehealth: Payer: Self-pay | Admitting: Pharmacist

## 2022-04-09 ENCOUNTER — Ambulatory Visit: Payer: Medicare Other | Admitting: Pharmacist

## 2022-04-09 NOTE — Telephone Encounter (Signed)
LIBRE 2 CGM SUBMITTED TO [employer] [facility] PATIENT IS NOT ON INSULIN SLIDING SCALE FOR STEROID SHOTS

## 2022-04-11 DIAGNOSIS — E1165 Type 2 diabetes mellitus with hyperglycemia: Secondary | ICD-10-CM | POA: Diagnosis not present

## 2022-04-16 ENCOUNTER — Ambulatory Visit (INDEPENDENT_AMBULATORY_CARE_PROVIDER_SITE_OTHER): Payer: Medicare Other | Admitting: Family Medicine

## 2022-04-16 ENCOUNTER — Encounter: Payer: Self-pay | Admitting: Family Medicine

## 2022-04-16 ENCOUNTER — Ambulatory Visit (INDEPENDENT_AMBULATORY_CARE_PROVIDER_SITE_OTHER): Payer: Medicare Other | Admitting: Pharmacist

## 2022-04-16 DIAGNOSIS — N958 Other specified menopausal and perimenopausal disorders: Secondary | ICD-10-CM

## 2022-04-16 DIAGNOSIS — R300 Dysuria: Secondary | ICD-10-CM | POA: Diagnosis not present

## 2022-04-16 DIAGNOSIS — E1165 Type 2 diabetes mellitus with hyperglycemia: Secondary | ICD-10-CM

## 2022-04-16 DIAGNOSIS — B379 Candidiasis, unspecified: Secondary | ICD-10-CM | POA: Diagnosis not present

## 2022-04-16 DIAGNOSIS — E11649 Type 2 diabetes mellitus with hypoglycemia without coma: Secondary | ICD-10-CM | POA: Diagnosis not present

## 2022-04-16 DIAGNOSIS — N3001 Acute cystitis with hematuria: Secondary | ICD-10-CM | POA: Diagnosis not present

## 2022-04-16 LAB — MICROSCOPIC EXAMINATION
Renal Epithel, UA: NONE SEEN /hpf
WBC, UA: >30 /hpf — AB (ref 0–5)

## 2022-04-16 LAB — URINALYSIS, ROUTINE W REFLEX MICROSCOPIC
Bilirubin, UA: NEGATIVE
Glucose, UA: NEGATIVE
Ketones, UA: NEGATIVE
Nitrite, UA: POSITIVE — AB
Protein,UA: NEGATIVE
Specific Gravity, UA: 1.025 (ref 1.005–1.030)
Urobilinogen, Ur: 0.2 mg/dL (ref 0.2–1.0)
pH, UA: 6 (ref 5.0–7.5)

## 2022-04-16 MED ORDER — CIPROFLOXACIN HCL 500 MG PO TABS: 500.0000 mg | ORAL_TABLET | Freq: Two times a day (BID) | ORAL | 0 refills | Status: AC

## 2022-04-16 MED ORDER — PROMETHAZINE HCL 12.5 MG PO TABS: 12.5000 mg | ORAL_TABLET | Freq: Three times a day (TID) | ORAL | 0 refills | Status: DC | PRN

## 2022-04-16 MED ORDER — MIRABEGRON ER 50 MG PO TB24: 50.0000 mg | ORAL_TABLET | Freq: Every day | ORAL | 9 refills | Status: DC

## 2022-04-16 MED ORDER — FLUCONAZOLE 150 MG PO TABS: ORAL_TABLET | ORAL | 0 refills | Status: DC

## 2022-04-16 MED ORDER — TRULICITY 3 MG/0.5ML ~~LOC~~ SOAJ: 3.0000 mg | SUBCUTANEOUS | 11 refills | Status: DC

## 2022-04-16 NOTE — [place] [facility] (Signed)
Virtual Visit via telephone Note Due to COVID-19 pandemic this visit was conducted virtually. This visit type was conducted due to national recommendations for restrictions regarding the COVID-19 Pandemic (e.g. social distancing, sheltering in place) in an effort to limit this patient's exposure and mitigate transmission in our community. All issues noted in this document were discussed and addressed.  A physical exam was not performed with this format.   I connected with Cynthia Deleon on 04/16/2022 at 1400 by telephone and verified that I am speaking with the correct person using two identifiers. Cynthia Deleon is currently located at home and patient is currently with them during visit. The provider, Monia Pouch, FNP is located in their office at time of visit.  I discussed the limitations, risks, security and privacy concerns of performing an evaluation and management service by virtual visit and the availability of in person appointments. I also discussed with the patient that there may be a patient responsible charge related to this service. The patient expressed understanding and agreed to proceed.  Subjective:  [name], female    DOB: 10-Sep-1955, 66 y.o.   MRN: [id number]  Chief Complaint:  Dysuria   HPI: Cynthia Deleon is a 66 y.o. female presenting on 04/16/2022 for Dysuria   Dysuria  This is a new problem. Episode onset: 6 days ago. The problem occurs every urination. The problem has been gradually worsening. The quality of the pain is described as aching, burning and shooting. The pain is moderate. There has been no fever. She is Not sexually active. There is No history of pyelonephritis. Associated symptoms include frequency. Pertinent negatives include no chills, discharge, flank pain, hematuria, hesitancy, nausea, possible pregnancy, sweats, urgency or vomiting. She has tried increased fluids for the symptoms. The treatment provided no relief.      Relevant past medical, surgical, family, and social history reviewed and updated as indicated.  Allergies and medications reviewed and updated.   Past Medical History:  Diagnosis Date   Aortic atherosclerosis ([place]) 10/31/2021   Arthritis    hands and knees   Bipolar 1 disorder (HCC)    Borderline diabetic    Chronic back pain    Chronic neck pain    Complication of anesthesia    high anxiety-does not want to be alone   COPD (chronic obstructive pulmonary disease) ([place])    Current use of estrogen therapy 01/12/2014   Depression    Diabetes mellitus without complication ([place])    Diplopia    Family history of adverse reaction to anesthesia    sister "gas in lungs"   GERD (gastroesophageal reflux disease)    Headache    Hepatic steatosis 10/31/2021   Hot flashes 12/15/2013   Hypertension    IBS (irritable bowel syndrome)    Incontinence    Kidney stone    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[place])    Osteopenia 02/06/2022   Panic attacks    Peptic ulcer    Peripheral neuropathy    Rosacea    Shingles    Sleep apnea    uses a cpap-with oxygen    Past Surgical History:  Procedure Laterality Date   BIOPSY  04/27/2020   Procedure: BIOPSY;  Surgeon: Ronald Lobo, MD;  Location: [facility];  Service: Endoscopy;;   BUNIONECTOMY WITH HAMMERTOE RECONSTRUCTION Right 12/10/2012   Procedure: RIGHT FIRST METATARSAL CHEVRON BUNION CORRECTION,  2 AND 3 HAMMERTOE CORRECTION , RIGHT 3 AND 4 TOE NAIL EXCISION ;  Surgeon: Jenny Reichmann  Doran Durand, MD;  Location: [facility];  Service: Orthopedics;  Laterality: Right;   COLONOSCOPY WITH PROPOFOL N/A 04/27/2020   Procedure: COLONOSCOPY WITH PROPOFOL;  Surgeon: Ronald Lobo, MD;  Location: [facility];  Service: Endoscopy;  Laterality: N/A;   FOOT ARTHRODESIS  2000   both feet   LIPOSUCTION  03/2018   RECTAL SURGERY     TONSILLECTOMY     TOTAL ABDOMINAL HYSTERECTOMY      Social History   Socioeconomic History   Marital  status: Divorced    Spouse name: Not on file   Number of children: 5   Years of education: 9th   Highest education level: Not on file  Occupational History   Occupation: Disabled  Tobacco Use   Smoking status: Former    Packs/day: 1.00    Years: 42.00    Total pack years: 42.00    Types: Cigarettes    Quit date: 06/10/2017    Years since quitting: 4.8   Smokeless tobacco: Never  Vaping Use   Vaping Use: Never used  Substance and Sexual Activity   Alcohol use: No    Alcohol/week: 0.0 standard drinks of alcohol   Drug use: No   Sexual activity: Never    Birth control/protection: Surgical    Comment: occ cigarette  Other Topics Concern   Not on file  Social History Narrative   Lives at home with home with her son (has five children).   Right-handed.   1 cup caffeine per day.   Social Determinants of Health   Financial Resource Strain: Not on file  Food Insecurity: No Food Insecurity (01/07/2020)   Hunger Vital Sign    Worried About Running Out of Food in the Last Year: Never true    Ran Out of Food in the Last Year: Never true  Transportation Needs: No Transportation Needs (01/07/2020)   PRAPARE - Hydrologist (Medical): No    Lack of Transportation (Non-Medical): No  Physical Activity: Not on file  Stress: Not on file  Social Connections: Not on file  Intimate Partner Violence: Not on file    Outpatient Encounter Medications as of 04/16/2022  Medication Sig   ciprofloxacin (CIPRO) 500 MG tablet Take 1 tablet (500 mg total) by mouth 2 (two) times daily for 7 days.   albuterol (VENTOLIN HFA) 108 (90 Base) MCG/ACT inhaler Inhale 2 puffs into the lungs every 6 (six) hours as needed. (Patient taking differently: Inhale 2 puffs into the lungs every 6 (six) hours as needed for wheezing or shortness of breath.)   aspirin EC 81 MG tablet Take 81 mg by mouth daily. Swallow whole.   atorvastatin (LIPITOR) 80 MG tablet Take 1 tablet (80 mg total) by  mouth daily.   cholecalciferol (VITAMIN D) 25 MCG tablet Take 1 tablet (1,000 Units total) by mouth daily.   Continuous Blood Gluc Sensor (FREESTYLE LIBRE 2 SENSOR) MISC Apply to arm every 14 days to check blood sugar continuously. DX E11.65. Fill at [facility] via [facility] because medicare requirements   diazepam (VALIUM) 5 MG tablet Take 5 mg by mouth 2 (two) times daily.   Dulaglutide (TRULICITY) 3 MW/4.1LK SOPN Inject 3 mg as directed once a week. DX:E11.65   DULoxetine (CYMBALTA) 60 MG capsule Take 1 capsule (60 mg total) by mouth daily.   estrogens, conjugated, (PREMARIN) 0.3 MG tablet TAKE (1) TABLET BY MOUTH ONCE DAILY.   ezetimibe (ZETIA) 10 MG tablet Take 1 tablet (10  mg total) by mouth daily.   fluorometholone (FML) 0.1 % ophthalmic suspension SMARTSIG:In Eye(s)   fluticasone (FLONASE) 50 MCG/ACT nasal spray USE 2 SPRAYS IN EACH NOSTRIL ONCE DAILY. (Patient taking differently: Place 2 sprays into both nostrils daily.)   Fluticasone-Umeclidin-Vilant (TRELEGY ELLIPTA) 100-62.5-25 MCG/ACT AEPB Inhale 1 puff into the lungs daily.   glipiZIDE (GLUCOTROL XL) 5 MG 24 hr tablet Take 1 tablet (5 mg total) by mouth 2 (two) times daily with a meal.   glucose blood (CONTOUR NEXT TEST) test strip Test BS 4 times daily Dx E11.9   insulin aspart (FIASP FLEXTOUCH) 100 UNIT/ML FlexTouch Pen Inject 10-15 Units into the skin as directed. 3 TIMES DAILY WITH MEALS WHEN GETTING STEROID INJECTIONS   lamoTRIgine (LAMICTAL) 200 MG tablet Take 200 mg by mouth at bedtime. For mood disorder.   lansoprazole (PREVACID) 30 MG capsule Take 1 capsule (30 mg total) by mouth daily in the afternoon.   levocetirizine (XYZAL) 5 MG tablet Take 1 tablet (5 mg total) by mouth every evening.   lisinopril (ZESTRIL) 5 MG tablet Take 1 tablet (5 mg total) by mouth daily.   meclizine (ANTIVERT) 25 MG tablet Take 2 tablets (50 mg total) by mouth daily as needed for dizziness.   mirabegron ER (MYRBETRIQ) 50 MG  TB24 tablet Take 1 tablet (50 mg total) by mouth daily.   montelukast (SINGULAIR) 10 MG tablet TAKE (1) TABLET BY MOUTH AT BEDTIME.   OneTouch Delica Lancets 62H MISC Use to test blood sugar twice daily. DX: E11.9   oxyCODONE (ROXICODONE) 15 MG immediate release tablet Take 1 tablet (15 mg total) by mouth every 6 (six) hours as needed for pain.   pregabalin (LYRICA) 150 MG capsule Take 1 capsule (150 mg total) by mouth 3 (three) times daily.   promethazine (PHENERGAN) 12.5 MG tablet Take 1 tablet (12.5 mg total) by mouth every 8 (eight) hours as needed for nausea or vomiting.   valACYclovir (VALTREX) 500 MG tablet TAKE (1) TABLET BY MOUTH ONCE DAILY AS NEEDED FOR SHINGLES FLARE UP.   vitamin B-12 1000 MCG tablet Take 1 tablet (1,000 mcg total) by mouth daily.   No facility-administered encounter medications on file as of 04/16/2022.    Allergies  Allergen Reactions   Chantix [Varenicline] Other (See Comments)    Altered mental status-Per patient was put on allergy list by Dr Lorriane Shire bu patient staes she is not allergic to this and is currently taking it.    Divalproex Sodium Other (See Comments)    Hallucinations  Other reaction(s): Confusion   Other Anaphylaxis   Pseudoeph-Hydrocodone-Gg Nausea Only   Sulfa Antibiotics Anaphylaxis, Nausea Only, Swelling and Other (See Comments)    Swelling of tongue.   Varenicline Tartrate Other (See Comments)    Other reaction(s): Confusion   Ambien [Zolpidem] Other (See Comments)    Causes sleep walking   Clavulanic Acid Diarrhea   Codeine Nausea And Vomiting and Other (See Comments)   Elemental Sulfur Other (See Comments)    Swelling of tongue   Hydrocod Poli-Chlorphe Poli Er Other (See Comments)    Other reaction(s): Skin itches    Hydrocodone Nausea And Vomiting   Macrobid [Nitrofurantoin] Nausea And Vomiting    Dizziness, nausea, vomiting   Paxil [Paroxetine Hcl] Other (See Comments)    Causes ringing in the ears.    Prednisone Other  (See Comments)    Other reaction(s): Unknown    Amoxicillin Rash    Has patient had a PCN reaction causing  immediate rash, facial/tongue/throat swelling, SOB or lightheadedness with hypotension: No Has patient had a PCN reaction causing severe rash involving mucus membranes or skin necrosis: No Has patient had a PCN reaction that required hospitalization: No Has patient had a PCN reaction occurring within the last 10 years: Yes If all of the above answers are "NO", then may proceed with Cephalosporin use.    [place] [Dapagliflozin]     RECURRENT YEAST/UTI   Metformin And Related Diarrhea   Tape Rash    Review of Systems  Constitutional:  Negative for activity change, appetite change, chills, fatigue, fever and unexpected weight change.  Respiratory:  Negative for cough and shortness of breath.   Cardiovascular:  Negative for chest pain, palpitations and leg swelling.  Gastrointestinal:  Negative for abdominal pain, nausea and vomiting.  Genitourinary:  Positive for dysuria and frequency. Negative for decreased urine volume, difficulty urinating, flank pain, hematuria, hesitancy, pelvic pain, urgency, vaginal bleeding, vaginal discharge and vaginal pain.  Musculoskeletal:  Negative for back pain.  Neurological:  Negative for dizziness, weakness and headaches.  Psychiatric/Behavioral:  Negative for confusion.   All other systems reviewed and are negative.        Observations/Objective: No vital signs or physical exam, this was a virtual health encounter.  Pt alert and oriented, answers all questions appropriately, and able to speak in full sentences.    Assessment and Plan: Lumen was seen today for dysuria.  Diagnoses and all orders for this visit:  Dysuria Acute cystitis with hematuria Candida infection Urinalysis revealed positive nitrites, 1+ leukocytes, trace blood, many bacteria, and yeast. Will treat with below due to allergies and prior culture results. No indications  of acute pyelonephritis. Pt aware to report new, worsening, or persistent symptoms. Culture pending, will change treatment regimen if warranted.  -     Urinalysis, Routine w reflex microscopic -     ciprofloxacin (CIPRO) 500 MG tablet; Take 1 tablet (500 mg total) by mouth 2 (two) times daily for 7 days.     Follow Up Instructions: Return if symptoms worsen or fail to improve.    I discussed the assessment and treatment plan with the patient. The patient was provided an opportunity to ask questions and all were answered. The patient agreed with the plan and demonstrated an understanding of the instructions.   The patient was advised to call back or seek an in-person evaluation if the symptoms worsen or if the condition fails to improve as anticipated.  The above assessment and management plan was discussed with the patient. The patient verbalized understanding of and has agreed to the management plan. Patient is aware to call the clinic if they develop any new symptoms or if symptoms persist or worsen. Patient is aware when to return to the clinic for a follow-up visit. Patient educated on when it is appropriate to go to the emergency department.    I provided 15 minutes of time during this telephone encounter.   Monia Pouch, FNP-C [facility] [address] [place],  [address] [phone] 04/16/2022

## 2022-04-16 NOTE — [place] Notes (Signed)
     Pharmacy Note 04/16/2022 Name:  Cynthia Deleon    MRN:  [id number]      DOB:  07/11/1955   Summary:   Diabetes: uncontrolled- A1C 7.3% - BLOOD SUGARS increasing; current treatment: GLIPIZIDE, TRULICITY '3mg'$  SQ WEEKLY;  INTOLERANCES METFORMIN (DUE TO GI) DISCONTINUED FARXIGA DUE TO RECURRENT UTI/YEAST INFECTIONS DESPITE CONTROLLED SUGARS D/C'D OZEMPIC DUE TO GI SIDE EFFECTS (TOLERATES TRULICITY) CONTINUE TRULICITY TO '3MG'$  WEEKLY Denies personal and family history of Medullary thyroid cancer ([place]) JANUVIA DISCONTINUED (due to trulicity) Sliding scale humalog given due to recurrent steroid injections May have to start basal insulin Will follow closely CONTINUE GLIPIZIDE  Current glucose readings: fasting glucose: up to 180, post prandial glucose: >200 MUCH IMPROVED Patient would like to continue libre 2 CGM-- will call into pharmacy locally Extensive education provided on FS libre 2 CGM system; pt able to demonstrated and teachback Denies hypoglycemic symptoms Discussed meal planning options and Plate method for healthy eating--Patient admits to not eating wisely, she is trying to work on this Avoid sugary drinks and desserts Incorporate balanced protein, non starchy veggies, 1 serving of carbohydrate with each meal Increase water intake Increase physical activity as able Current exercise: active job  Educated on current medications, patient on pill packaging with [place], INCREASE GLIPIZIDE, LIBRE Recommended annual foot exam--prefers dr. Irving Shows in [facility]          Last diabetic Foot exam:  Labs (Brief)  No results found for: HMDIABFOOTEX   Lipid Panel     Component Value Date/Time   CHOL 213 (H) 02/06/2022 1100   TRIG 107 02/06/2022 1100   HDL 51 02/06/2022 1100   CHOLHDL 4.2 02/06/2022 1100   CHOLHDL 4.1 11/16/2015 0715   VLDL 48 (H) 11/16/2015 0715   LDLCALC 143 (H) 02/06/2022 1100   LABVLDL 19 02/06/2022 1100      Regina Eck, PharmD, BCPS Clinical Pharmacist, [place]  II Phone [phone]

## 2022-04-18 ENCOUNTER — Other Ambulatory Visit: Payer: Self-pay | Admitting: Family Medicine

## 2022-04-19 LAB — URINE CULTURE

## 2022-04-23 ENCOUNTER — Ambulatory Visit (INDEPENDENT_AMBULATORY_CARE_PROVIDER_SITE_OTHER): Payer: Medicare Other | Admitting: Pulmonary Disease

## 2022-04-23 ENCOUNTER — Encounter: Payer: Self-pay | Admitting: Pulmonary Disease

## 2022-04-23 VITALS — BP 130/78 | HR 98 | Temp 98.2°F | Ht 69.0 in | Wt 176.8 lb

## 2022-04-23 DIAGNOSIS — J4489 Other specified chronic obstructive pulmonary disease: Secondary | ICD-10-CM

## 2022-04-23 DIAGNOSIS — G4734 Idiopathic sleep related nonobstructive alveolar hypoventilation: Secondary | ICD-10-CM

## 2022-04-23 DIAGNOSIS — G4733 Obstructive sleep apnea (adult) (pediatric): Secondary | ICD-10-CM | POA: Diagnosis not present

## 2022-04-23 DIAGNOSIS — J4550 Severe persistent asthma, uncomplicated: Secondary | ICD-10-CM

## 2022-04-23 MED ORDER — ANORO ELLIPTA 62.5-25 MCG/ACT IN AEPB: 1.0000 | INHALATION_SPRAY | Freq: Every day | RESPIRATORY_TRACT | 5 refills | Status: DC

## 2022-04-23 NOTE — Progress Notes (Signed)
[facility]  Chief Complaint  Patient presents with   Follow-up    CPAP working well  Wants to discuss daliresp     Constitutional:  BP 130/78   Pulse 98   Temp 98.2 F (36.8 C)   Ht '5\' 9"'$  (1.753 m)   Wt 176 lb 12.8 oz (80.2 kg)   SpO2 93% Comment: ra  BMI 26.11 kg/m   Past Medical History:  OA, Bipolar, DM, Back pain, Depression, Diplopia, GERD, Headache, HTN, IBS, Nephrolithiasis, Multiple personality disorder, Panic attacks, PUD, Neuropathy, Shingles, Rosacea  Past Surgical History:  She  has a past surgical history that includes Tonsillectomy; Foot arthrodesis (2000); Bunionectomy with hammertoe reconstruction (Right, 12/10/2012); Rectal surgery; Liposuction (03/2018); Colonoscopy with propofol (N/A, 04/27/2020); biopsy (04/27/2020); and Total abdominal hysterectomy.  Brief Summary:  Cynthia Deleon is a 66 y.o. female former smoker with allergic asthma, emphysema and obstructive sleep apnea.       Subjective:   Sleep study showed she needed oxygen with CPAP.  Using CPAP and 1 liter oxygen at night.  Sleeping well.  Had thrush again and treated with diflucan.  Has sinus congestion.  Hasn't been using nasal irrigation.  Not having cough, wheeze, or sputum.  Physical Exam:   Appearance - well kempt   ENMT - no sinus tenderness, no oral exudate, no LAN, Mallampati 3 airway, no stridor  Respiratory - equal breath sounds bilaterally, no wheezing or rales  CV - s1s2 regular rate and rhythm, no murmurs  Ext - no clubbing, no edema  Skin - no rashes  Psych - normal mood and affect       Pulmonary testing:  PFT 05/13/19 >> FEV1 2.06 (69%), FEV1% 78, TLC 4.56 (78%), DLCO 97%, +BD  Chest Imaging:  LDCT chest 04/20/19 >> mild centrilobular emphysema, atherosclerosis, 3 mm nodule RML, fatty liver LDCT chest 12/10/21 >> atherosclerosis, mild centrilobular emphysema, 4.4 mm RML nodule, fatty liver  Sleep Tests:   PSG 04/30/21 >> AHI 6.5, SpO2 low 84%; spent 162.5 min with an SpO2 < 88% CPAP titration 08/13/21 >> CPAP with 2 liters O2 >> AHI 0 ONO with CPAP 09/25/21 >> test time 10 hrs 2 min.  Mean SpO2 88%, low SpO2 81%.  Spent 4 hrs with SpO2 < 88% CPAP titration 03/19/22 >> CPAP 7 cm H2O with 1 liter oxygen >> AHI 0  Auto CPAP 03/23/22 to 04/21/22 >> used on 27 of 30 nights with average 7 hrs 37 min.  Average AHI 2.9 with median CPAP 7 and 95 th percentile CPAP 9 cm H2O  Cardiac Tests:  Echo 02/17/18 >> EF 55 to 60%, grade 1 DD  Social History:  She  reports that she quit smoking about 4 years ago. Her smoking use included cigarettes. She has a 42.00 pack-year smoking history. She has never used smokeless tobacco. She reports that she does not drink alcohol and does not use drugs.  Family History:  Her family history includes Alcohol abuse in her brother, brother, brother, and brother; COPD in her brother, father, and sister; Diabetes in her maternal grandfather, maternal grandmother, mother, and sister; Heart attack in her sister; Hyperlipidemia in her sister; Other in her brother, mother, and sister.     Assessment/Plan:   COPD with allergic asthma and emphysema. - will d/c ICS due to frequent episodes of thrush - change from trelegy to anoro - continue singulair - prn albuterol  Perennial allergic rhinitis. - continue singulair, flonase, claritin -  advised her to add in nasal irrigation - prn azelastine  History of tobacco abuse. - she start smoking as a teenager, smoked 1 ppd, and quit in 2019 - follow up low dose CT chest in July 2024  Obstructive sleep apnea and nocturnal hypoxemia. - she is compliant with CPAP and reports benefit from therapy - she uses [facility] for her DME - current CPAP ordered on 08/16/21 - continue auto CPAP 5 to 10 cm H2O with 1 liter oxygen at night - will make sure she gets 2 liters oxygen set up to use a night with CPAP  Recurrent episodes of  thrush and vaginal candidiasis. - she usually needs a course of diflucan when she takes antibiotics  Time Spent Involved in Patient Care on Day of Examination:  38 minutes  Follow up:   Patient Instructions  Anoro one puff daily  Stop trelegy once you start anoro  Follow up in 6 months  Medication List:   Allergies as of 04/23/2022       Reactions   Chantix [varenicline] Other (See Comments)   Altered mental status-Per patient was put on allergy list by Dr Lorriane Shire bu patient staes she is not allergic to this and is currently taking it.    Divalproex Sodium Other (See Comments)   Hallucinations  Other reaction(s): Confusion   Other Anaphylaxis   Pseudoeph-hydrocodone-gg Nausea Only   Sulfa Antibiotics Anaphylaxis, Nausea Only, Swelling, Other (See Comments)   Swelling of tongue.   Varenicline Tartrate Other (See Comments)   Other reaction(s): Confusion   Ambien [zolpidem] Other (See Comments)   Causes sleep walking   Clavulanic Acid Diarrhea   Codeine Nausea And Vomiting, Other (See Comments)   Elemental Sulfur Other (See Comments)   Swelling of tongue   Hydrocod Poli-chlorphe Poli Er Other (See Comments)   Other reaction(s): Skin itches   Hydrocodone Nausea And Vomiting   Macrobid [nitrofurantoin] Nausea And Vomiting   Dizziness, nausea, vomiting   Paxil [paroxetine Hcl] Other (See Comments)   Causes ringing in the ears.    Prednisone Other (See Comments)   Other reaction(s): Unknown   Amoxicillin Rash   Has patient had a PCN reaction causing immediate rash, facial/tongue/throat swelling, SOB or lightheadedness with hypotension: No Has patient had a PCN reaction causing severe rash involving mucus membranes or skin necrosis: No Has patient had a PCN reaction that required hospitalization: No Has patient had a PCN reaction occurring within the last 10 years: Yes If all of the above answers are "NO", then may proceed with Cephalosporin use.   Farxiga  [dapagliflozin]    RECURRENT YEAST/UTI   Metformin And Related Diarrhea   Tape Rash        Medication List        Accurate as of April 23, 2022  9:33 AM. If you have any questions, ask your nurse or doctor.          STOP taking these medications    Trelegy Ellipta 100-62.5-25 MCG/ACT Aepb Generic drug: Fluticasone-Umeclidin-Vilant Stopped by: Chesley Mires, MD       TAKE these medications    albuterol 108 (90 Base) MCG/ACT inhaler Commonly known as: VENTOLIN HFA Inhale 2 puffs into the lungs every 6 (six) hours as needed. What changed: reasons to take this   Anoro Ellipta 62.5-25 MCG/ACT Aepb Generic drug: umeclidinium-vilanterol Inhale 1 puff into the lungs daily. Started by: Chesley Mires, MD   aspirin EC 81 MG tablet Take 81 mg by mouth  daily. Swallow whole.   atorvastatin 80 MG tablet Commonly known as: LIPITOR Take 1 tablet (80 mg total) by mouth daily.   ciprofloxacin 500 MG tablet Commonly known as: Cipro Take 1 tablet (500 mg total) by mouth 2 (two) times daily for 7 days.   Contour Next Test test strip Generic drug: glucose blood Test BS 4 times daily Dx E11.9   cyanocobalamin 1000 MCG tablet Take 1 tablet (1,000 mcg total) by mouth daily.   diazepam 5 MG tablet Commonly known as: VALIUM Take 5 mg by mouth 2 (two) times daily.   DULoxetine 60 MG capsule Commonly known as: Cymbalta Take 1 capsule (60 mg total) by mouth daily.   ezetimibe 10 MG tablet Commonly known as: Zetia Take 1 tablet (10 mg total) by mouth daily.   Fiasp FlexTouch 100 UNIT/ML FlexTouch Pen Generic drug: insulin aspart Inject 10-15 Units into the skin as directed. 3 TIMES DAILY WITH MEALS WHEN GETTING STEROID INJECTIONS   fluconazole 150 MG tablet Commonly known as: Diflucan 1 po q week x 4 weeks   fluorometholone 0.1 % ophthalmic suspension Commonly known as: FML SMARTSIG:In Eye(s)   fluticasone 50 MCG/ACT nasal spray Commonly known as: FLONASE USE 2  SPRAYS IN EACH NOSTRIL ONCE DAILY.   FreeStyle Libre 2 Sensor Misc Apply to arm every 14 days to check blood sugar continuously. DX E11.65. Fill at [facility] via [facility] because medicare requirements   glipiZIDE 5 MG 24 hr tablet Commonly known as: GLUCOTROL XL Take 1 tablet (5 mg total) by mouth 2 (two) times daily with a meal.   lamoTRIgine 200 MG tablet Commonly known as: LAMICTAL Take 200 mg by mouth at bedtime. For mood disorder.   lansoprazole 30 MG capsule Commonly known as: PREVACID Take 1 capsule (30 mg total) by mouth daily in the afternoon.   levocetirizine 5 MG tablet Commonly known as: XYZAL Take 1 tablet (5 mg total) by mouth every evening.   lisinopril 5 MG tablet Commonly known as: ZESTRIL Take 1 tablet (5 mg total) by mouth daily.   meclizine 25 MG tablet Commonly known as: ANTIVERT Take 2 tablets (50 mg total) by mouth daily as needed for dizziness.   mirabegron ER 50 MG Tb24 tablet Commonly known as: Myrbetriq Take 1 tablet (50 mg total) by mouth daily.   montelukast 10 MG tablet Commonly known as: SINGULAIR TAKE (1) TABLET BY MOUTH AT BEDTIME.   OneTouch Delica Lancets 02I Misc Use to test blood sugar twice daily. DX: E11.9   oxyCODONE 15 MG immediate release tablet Commonly known as: ROXICODONE Take 1 tablet (15 mg total) by mouth every 6 (six) hours as needed for pain.   pregabalin 150 MG capsule Commonly known as: LYRICA Take 1 capsule (150 mg total) by mouth 3 (three) times daily.   Premarin 0.3 MG tablet Generic drug: estrogens (conjugated) TAKE (1) TABLET BY MOUTH ONCE DAILY.   promethazine 12.5 MG tablet Commonly known as: PHENERGAN Take 1 tablet (12.5 mg total) by mouth every 8 (eight) hours as needed for nausea or vomiting.   Trulicity 3 OX/7.3ZH Sopn Generic drug: Dulaglutide Inject 3 mg as directed once a week. DX:E11.65   valACYclovir 500 MG tablet Commonly known as: VALTREX TAKE (1) TABLET BY MOUTH  ONCE DAILY AS NEEDED FOR SHINGLES FLARE UP.   vitamin D3 25 MCG tablet Commonly known as: CHOLECALCIFEROL Take 1 tablet (1,000 Units total) by mouth daily.        Signature:  Corina Stacy  Halford Chessman, MD [place] Pager - [phone] 04/23/2022, 9:33 AM

## 2022-04-23 NOTE — Patient Instructions (Signed)
Anoro one puff daily  Stop trelegy once you start anoro  Follow up in 6 months

## 2022-04-25 MED ORDER — INSULIN LISPRO (1 UNIT DIAL) 100 UNIT/ML (KWIKPEN): PEN_INJECTOR | SUBCUTANEOUS | 11 refills | Status: DC

## 2022-04-25 MED ORDER — FREESTYLE LIBRE 2 SENSOR MISC: 11 refills | Status: DC

## 2022-04-25 MED ORDER — PIP PEN NEEDLES 32G X 4MM 32G X 4 MM MISC: 11 refills | Status: DC

## 2022-04-27 DIAGNOSIS — G4733 Obstructive sleep apnea (adult) (pediatric): Secondary | ICD-10-CM | POA: Diagnosis not present

## 2022-05-01 ENCOUNTER — Telehealth: Payer: Self-pay | Admitting: Pharmacist

## 2022-05-01 NOTE — Telephone Encounter (Signed)
[facility] called requesting to speak with nurse regarding PA for [facility]. They need more info in order to approve/deny the request.  Please call them back at [phone]  Ref# [id number]  PA will be closed if no response is given before 05/14/22.

## 2022-05-01 NOTE — Telephone Encounter (Signed)
Called PA number - said PA is in review  Tried to get a representative on the phone but it just kept saying PA is in review.

## 2022-05-02 DIAGNOSIS — G4733 Obstructive sleep apnea (adult) (pediatric): Secondary | ICD-10-CM | POA: Diagnosis not present

## 2022-05-06 NOTE — Telephone Encounter (Signed)
Received form from [employer] - form competed and faxed

## 2022-05-07 NOTE — Telephone Encounter (Signed)
Outcome Approved on November 27 Request Reference Number: [id number]. [place] KIT 2 SENSOR is approved through 06/10/2023. Your patient may now fill this prescription and it will be covered. Authorization Expiration Date: 06/10/2023

## 2022-05-10 ENCOUNTER — Ambulatory Visit: Payer: Medicare Other | Admitting: Family Medicine

## 2022-05-10 NOTE — Telephone Encounter (Signed)
Left detailed message on VM.

## 2022-05-14 ENCOUNTER — Telehealth: Payer: Self-pay | Admitting: Family Medicine

## 2022-05-14 DIAGNOSIS — E11649 Type 2 diabetes mellitus with hypoglycemia without coma: Secondary | ICD-10-CM

## 2022-05-16 ENCOUNTER — Telehealth: Payer: Self-pay | Admitting: Family Medicine

## 2022-05-16 NOTE — Telephone Encounter (Signed)
No answer unable to leave a message for patient to call back and schedule Medicare Annual Wellness Visit (AWV) to be completed by video or phone.  No hx of AWV eligible for AWVI per [facility] as of 12/08/2021  Please schedule at anytime with [place]   45  Minutes appointment   Any questions, please call me at [phone]

## 2022-05-17 MED ORDER — TRULICITY 4.5 MG/0.5ML ~~LOC~~ SOAJ: 4.5000 mg | SUBCUTANEOUS | 4 refills | Status: DC

## 2022-05-17 NOTE — Telephone Encounter (Signed)
Trulicity increased to 4.'5mg'$  weekly Patient counseled extensively on GI side effects, adverse events Denies personal and family history of Medullary thyroid cancer (MTC) Recommended patient continue recommended daily intake of food/water She will contact us with concerns

## 2022-05-20 ENCOUNTER — Telehealth: Payer: Self-pay | Admitting: Family Medicine

## 2022-05-20 ENCOUNTER — Telehealth: Payer: Self-pay | Admitting: Cardiovascular Disease

## 2022-05-20 NOTE — Telephone Encounter (Signed)
Pt needs letter sent to her dentist office ASAP stating that it is ok for her to have a tooth pulled while taking the blood thinner that she is on. Fax letter to [phone]. Pt is waiting at the dentist now. Needs this done ASAP.

## 2022-05-20 NOTE — Telephone Encounter (Signed)
I don't see a blood thinner on her medication list.

## 2022-05-20 NOTE — Telephone Encounter (Signed)
Okay for letter covering pcp

## 2022-05-20 NOTE — Telephone Encounter (Signed)
Spoke with dental office, the are asking if the patient will need to hold plavix prior to extraction. Made aware we do not have her on a blood thinner and plavix is not on her medication list for our office. Made aware if taking plavix, it will need to be held prior to extraction. She is going to follow up with the patient to confirm her medications.

## 2022-05-20 NOTE — Telephone Encounter (Signed)
I spoke to pt and she said the dentist went ahead and took care of her tooth.

## 2022-05-20 NOTE — Telephone Encounter (Signed)
   Pre-operative Risk Assessment    Patient Name: Cynthia Deleon  DOB: 1955-08-13 MRN: [id number]     Request for Surgical Clearance    Procedure:  Dental Extraction - Amount of Teeth to be Pulled:  Tooth #5    Date of Surgery:  Clearance 05/20/22                                 Surgeon:  Dr. Otilio Connors Surgeon's Group or Practice Name:  [facility]  Phone number:  [phone] Fax number:  [phone]   Type of Clearance Requested:   - Medical  - Pharmacy:  Hold    Unsure as to what needs to be held.    Type of Anesthesia:  Local    Additional requests/questions:  Please advise surgeon/provider what medications should be held.  Signed, April Henson   05/20/2022, 11:09 AM

## 2022-05-21 ENCOUNTER — Other Ambulatory Visit: Payer: Self-pay | Admitting: Family Medicine

## 2022-05-21 DIAGNOSIS — E782 Mixed hyperlipidemia: Secondary | ICD-10-CM

## 2022-05-22 ENCOUNTER — Ambulatory Visit (INDEPENDENT_AMBULATORY_CARE_PROVIDER_SITE_OTHER): Payer: Medicare Other | Admitting: Family Medicine

## 2022-05-22 ENCOUNTER — Encounter: Payer: Self-pay | Admitting: Family Medicine

## 2022-05-22 VITALS — BP 118/71 | HR 89 | Temp 97.9°F | Ht 69.0 in | Wt 176.2 lb

## 2022-05-22 DIAGNOSIS — E1169 Type 2 diabetes mellitus with other specified complication: Secondary | ICD-10-CM

## 2022-05-22 DIAGNOSIS — E785 Hyperlipidemia, unspecified: Secondary | ICD-10-CM | POA: Diagnosis not present

## 2022-05-22 DIAGNOSIS — I152 Hypertension secondary to endocrine disorders: Secondary | ICD-10-CM

## 2022-05-22 DIAGNOSIS — M273 Alveolitis of jaws: Secondary | ICD-10-CM | POA: Diagnosis not present

## 2022-05-22 DIAGNOSIS — E11649 Type 2 diabetes mellitus with hypoglycemia without coma: Secondary | ICD-10-CM

## 2022-05-22 DIAGNOSIS — E1159 Type 2 diabetes mellitus with other circulatory complications: Secondary | ICD-10-CM | POA: Diagnosis not present

## 2022-05-22 DIAGNOSIS — L84 Corns and callosities: Secondary | ICD-10-CM

## 2022-05-22 DIAGNOSIS — I10 Essential (primary) hypertension: Secondary | ICD-10-CM | POA: Diagnosis not present

## 2022-05-22 MED ORDER — BIOTENE DRY MOUTH MOISTURIZING MT SOLN: 5.0000 mL | Freq: Two times a day (BID) | OROMUCOSAL | 0 refills | Status: AC

## 2022-05-22 NOTE — Patient Instructions (Signed)

## 2022-05-22 NOTE — Progress Notes (Signed)
Subjective:  [name], female    DOB: December 27, 1955, 66 y.o.   MRN: [id number]  Patient Care Team: Baruch Gouty, FNP as PCP - General (Family Medicine) Suella Broad, MD as Consulting Physician (Physical Medicine and Rehabilitation) Norma Fredrickson, MD as Consulting Physician (Psychiatry) Phillips Odor, MD as Consulting Physician (Neurology) Lavera Guise, [facility] (Pharmacist) Starlyn Skeans as Physician Assistant (Dermatology) Madelin Headings, DO (Optometry)   Chief Complaint:  Diabetes (6 week follow up. RX for DM shoes )   HPI: Cynthia Deleon is a 66 y.o. female presenting on 05/22/2022 for Diabetes (6 week follow up. RX for DM shoes )   1. Type 2 diabetes mellitus with hypoglycemia without coma, without long-term current use of insulin (HCC) Pt states she has been compliant with medications. Recent adjustment to regimen. Denies polyuria, polyphagia, or polydipsia. Does not follow a strict diet or exercise routine. Denies hyper- or hypoglycemic episodes.   2. Hypertension associated with diabetes ([place]) Complaint with medications. Denies chest pain, leg swelling, headaches, shortness of breath, weakness, or confusion. Denies dry cough or angioedema symptoms.   3. Hyperlipidemia associated with type 2 diabetes mellitus ([place]) On Zetia ans statin therapy, tolerating well. Does not follow a healthy diet or exercise routine.   4. Oral pain Pt states she has a tooth pulled and is now having significant oral pain. States she went to the drug store today and bought some medication to put on it, pt pulled out medications which was for mouth ulcers and not orajel. Advised to not use this. States the area is very painful and sensitive to hot and cold. Has a follow up appointment with dentist Saturday.   Pt is very slowed in office and words are slurred. States she recently took one of her prescribed pain pills due to her oral pain.     Relevant past  medical, surgical, family, and social history reviewed and updated as indicated.  Allergies and medications reviewed and updated. Data reviewed: Chart in Epic.   Past Medical History:  Diagnosis Date   Aortic atherosclerosis ([place]) 10/31/2021   Arthritis    hands and knees   Bipolar 1 disorder (HCC)    Borderline diabetic    Chronic back pain    Chronic neck pain    Complication of anesthesia    high anxiety-does not want to be alone   COPD (chronic obstructive pulmonary disease) ([place])    Current use of estrogen therapy 01/12/2014   Depression    Diabetes mellitus without complication (HCC)    Diplopia    Family history of adverse reaction to anesthesia    sister "gas in lungs"   GERD (gastroesophageal reflux disease)    Headache    Hepatic steatosis 10/31/2021   Hot flashes 12/15/2013   Hypertension    IBS (irritable bowel syndrome)    Incontinence    Kidney stone    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[place])    Osteopenia 02/06/2022   Panic attacks    Peptic ulcer    Peripheral neuropathy    Rosacea    Shingles    Sleep apnea    uses a cpap-with oxygen    Past Surgical History:  Procedure Laterality Date   BIOPSY  04/27/2020   Procedure: BIOPSY;  Surgeon: Ronald Lobo, MD;  Location: [facility];  Service: Endoscopy;;   BUNIONECTOMY WITH HAMMERTOE RECONSTRUCTION Right 12/10/2012   Procedure: RIGHT FIRST METATARSAL CHEVRON BUNION CORRECTION,  2 AND 3  HAMMERTOE CORRECTION , RIGHT 3 AND 4 TOE NAIL EXCISION ;  Surgeon: Wylene Simmer, MD;  Location: [place];  Service: Orthopedics;  Laterality: Right;   COLONOSCOPY WITH PROPOFOL N/A 04/27/2020   Procedure: COLONOSCOPY WITH PROPOFOL;  Surgeon: Ronald Lobo, MD;  Location: [facility];  Service: Endoscopy;  Laterality: N/A;   FOOT ARTHRODESIS  2000   both feet   LIPOSUCTION  03/2018   RECTAL SURGERY     TONSILLECTOMY     TOTAL ABDOMINAL HYSTERECTOMY      Social History   Socioeconomic History    Marital status: Divorced    Spouse name: Not on file   Number of children: 5   Years of education: 9th   Highest education level: Not on file  Occupational History   Occupation: Disabled  Tobacco Use   Smoking status: Former    Packs/day: 1.00    Years: 42.00    Total pack years: 42.00    Types: Cigarettes    Quit date: 06/10/2017    Years since quitting: 4.9   Smokeless tobacco: Never  Vaping Use   Vaping Use: Never used  Substance and Sexual Activity   Alcohol use: No    Alcohol/week: 0.0 standard drinks of alcohol   Drug use: No   Sexual activity: Never    Birth control/protection: Surgical    Comment: occ cigarette  Other Topics Concern   Not on file  Social History Narrative   Lives at home with home with her son (has five children).   Right-handed.   1 cup caffeine per day.   Social Determinants of Health   Financial Resource Strain: Not on file  Food Insecurity: No Food Insecurity (01/07/2020)   Hunger Vital Sign    Worried About Running Out of Food in the Last Year: Never true    Ran Out of Food in the Last Year: Never true  Transportation Needs: No Transportation Needs (01/07/2020)   PRAPARE - Hydrologist (Medical): No    Lack of Transportation (Non-Medical): No  Physical Activity: Not on file  Stress: Not on file  Social Connections: Not on file  Intimate Partner Violence: Not on file    Outpatient Encounter Medications as of 05/22/2022  Medication Sig   albuterol (VENTOLIN HFA) 108 (90 Base) MCG/ACT inhaler Inhale 2 puffs into the lungs every 6 (six) hours as needed. (Patient taking differently: Inhale 2 puffs into the lungs every 6 (six) hours as needed for wheezing or shortness of breath.)   Artificial Saliva (BIOTENE DRY MOUTH MOISTURIZING) SOLN Use as directed 5 mLs in the mouth or throat in the morning and at bedtime.   aspirin EC 81 MG tablet Take 81 mg by mouth daily. Swallow whole.   atorvastatin (LIPITOR) 80 MG tablet  Take 1 tablet (80 mg total) by mouth daily.   cholecalciferol (VITAMIN D) 25 MCG tablet Take 1 tablet (1,000 Units total) by mouth daily.   Continuous Blood Gluc Sensor (FREESTYLE LIBRE 2 SENSOR) MISC Apply to arm every 14 days to check blood sugar continuously. DX E11.65   diazepam (VALIUM) 5 MG tablet Take 5 mg by mouth 2 (two) times daily.   Dulaglutide (TRULICITY) 4.5 GO/1.1XB SOPN Inject 4.5 mg as directed once a week. DX: E11.65   DULoxetine (CYMBALTA) 60 MG capsule Take 1 capsule (60 mg total) by mouth daily.   estrogens, conjugated, (PREMARIN) 0.3 MG tablet TAKE (1) TABLET BY MOUTH ONCE DAILY.   ezetimibe (ZETIA)  10 MG tablet TAKE (1) TABLET BY MOUTH ONCE DAILY.   fluconazole (DIFLUCAN) 150 MG tablet 1 po q week x 4 weeks   fluorometholone (FML) 0.1 % ophthalmic suspension SMARTSIG:In Eye(s)   fluticasone (FLONASE) 50 MCG/ACT nasal spray USE 2 SPRAYS IN EACH NOSTRIL ONCE DAILY. (Patient taking differently: Place 2 sprays into both nostrils daily.)   glipiZIDE (GLUCOTROL XL) 5 MG 24 hr tablet Take 1 tablet (5 mg total) by mouth 2 (two) times daily with a meal.   glucose blood (CONTOUR NEXT TEST) test strip Test BS 4 times daily Dx E11.9   insulin lispro (HUMALOG KWIKPEN) 100 UNIT/ML KwikPen Use 5-10 units 2 times daily with breakfast and dinner when blood sugar is >200 on steroids   Insulin Pen Needle (PIP PEN NEEDLES 32G X 4MM) 32G X 4 MM MISC Use to inject insulin 2 times daily as needed. DX E11.65   lamoTRIgine (LAMICTAL) 200 MG tablet Take 200 mg by mouth at bedtime. For mood disorder.   lansoprazole (PREVACID) 30 MG capsule Take 1 capsule (30 mg total) by mouth daily in the afternoon.   levocetirizine (XYZAL) 5 MG tablet Take 1 tablet (5 mg total) by mouth every evening.   lisinopril (ZESTRIL) 5 MG tablet Take 1 tablet (5 mg total) by mouth daily.   meclizine (ANTIVERT) 25 MG tablet Take 2 tablets (50 mg total) by mouth daily as needed for dizziness.   mirabegron ER (MYRBETRIQ) 50 MG  TB24 tablet Take 1 tablet (50 mg total) by mouth daily.   montelukast (SINGULAIR) 10 MG tablet TAKE (1) TABLET BY MOUTH AT BEDTIME.   OneTouch Delica Lancets 13K MISC Use to test blood sugar twice daily. DX: E11.9   oxyCODONE (ROXICODONE) 15 MG immediate release tablet Take 1 tablet (15 mg total) by mouth every 6 (six) hours as needed for pain.   pregabalin (LYRICA) 150 MG capsule Take 1 capsule (150 mg total) by mouth 3 (three) times daily.   promethazine (PHENERGAN) 12.5 MG tablet Take 1 tablet (12.5 mg total) by mouth every 8 (eight) hours as needed for nausea or vomiting.   umeclidinium-vilanterol (ANORO ELLIPTA) 62.5-25 MCG/ACT AEPB Inhale 1 puff into the lungs daily.   valACYclovir (VALTREX) 500 MG tablet TAKE (1) TABLET BY MOUTH ONCE DAILY AS NEEDED FOR SHINGLES FLARE UP.   vitamin B-12 1000 MCG tablet Take 1 tablet (1,000 mcg total) by mouth daily.   No facility-administered encounter medications on file as of 05/22/2022.    Allergies  Allergen Reactions   Chantix [Varenicline] Other (See Comments)    Altered mental status-Per patient was put on allergy list by Dr Lorriane Shire bu patient staes she is not allergic to this and is currently taking it.    Divalproex Sodium Other (See Comments)    Hallucinations  Other reaction(s): Confusion   Other Anaphylaxis   Pseudoeph-Hydrocodone-Gg Nausea Only   Sulfa Antibiotics Anaphylaxis, Nausea Only, Swelling and Other (See Comments)    Swelling of tongue.   Varenicline Tartrate Other (See Comments)    Other reaction(s): Confusion   Ambien [Zolpidem] Other (See Comments)    Causes sleep walking   Clavulanic Acid Diarrhea   Codeine Nausea And Vomiting and Other (See Comments)   Elemental Sulfur Other (See Comments)    Swelling of tongue   Hydrocod Poli-Chlorphe Poli Er Other (See Comments)    Other reaction(s): Skin itches    Hydrocodone Nausea And Vomiting   Macrobid [Nitrofurantoin] Nausea And Vomiting    Dizziness, nausea, vomiting  Paxil [Paroxetine Hcl] Other (See Comments)    Causes ringing in the ears.    Prednisone Other (See Comments)    Other reaction(s): Unknown    Amoxicillin Rash    Has patient had a PCN reaction causing immediate rash, facial/tongue/throat swelling, SOB or lightheadedness with hypotension: No Has patient had a PCN reaction causing severe rash involving mucus membranes or skin necrosis: No Has patient had a PCN reaction that required hospitalization: No Has patient had a PCN reaction occurring within the last 10 years: Yes If all of the above answers are "NO", then may proceed with Cephalosporin use.    [place] [Dapagliflozin]     RECURRENT YEAST/UTI   Metformin And Related Diarrhea   Tape Rash    Review of Systems  Constitutional:  Negative for activity change, appetite change, chills, diaphoresis, fatigue, fever and unexpected weight change.  HENT:  Positive for dental problem.   Eyes: Negative.  Negative for photophobia and visual disturbance.  Respiratory:  Negative for cough, chest tightness and shortness of breath.   Cardiovascular:  Negative for chest pain, palpitations and leg swelling.  Gastrointestinal:  Negative for abdominal pain, blood in stool, constipation, diarrhea, nausea and vomiting.  Endocrine: Negative.   Genitourinary:  Negative for decreased urine volume, difficulty urinating, dysuria, frequency and urgency.  Musculoskeletal:  Positive for arthralgias, back pain and myalgias. Negative for gait problem, joint swelling, neck pain and neck stiffness.  Skin: Negative.   Allergic/Immunologic: Negative.   Neurological:  Negative for dizziness, tremors, seizures, syncope, facial asymmetry, speech difficulty, weakness, light-headedness, numbness and headaches.  Hematological: Negative.   Psychiatric/Behavioral:  Negative for confusion, hallucinations, sleep disturbance and suicidal ideas.   All other systems reviewed and are negative.       Objective:  BP 118/71    Pulse 89   Temp 97.9 F (36.6 C) (Temporal)   Ht _0  (1.753 m)   Wt 176 lb 3.2 oz (79.9 kg)   SpO2 95%   BMI 26.02 kg/m    Wt Readings from Last 3 Encounters:  05/22/22 176 lb 3.2 oz (79.9 kg)  04/23/22 176 lb 12.8 oz (80.2 kg)  03/29/22 175 lb 6.4 oz (79.6 kg)    Physical Exam Vitals and nursing note reviewed.  Constitutional:      General: She is not in acute distress.    Appearance: Normal appearance. She is well-developed, well-groomed and overweight. She is not ill-appearing, toxic-appearing or diaphoretic.  HENT:     Head: Normocephalic and atraumatic.     Jaw: There is normal jaw occlusion.     Right Ear: Hearing normal.     Left Ear: Hearing normal.     Nose: Nose normal.     Mouth/Throat:     Lips: Pink.     Mouth: Mucous membranes are moist.     Pharynx: Oropharynx is clear. Uvula midline.   Eyes:     General: Lids are normal.     Extraocular Movements: Extraocular movements intact.     Conjunctiva/sclera: Conjunctivae normal.     Pupils: Pupils are equal, round, and reactive to light.  Neck:     Thyroid: No thyroid mass, thyromegaly or thyroid tenderness.     Vascular: No carotid bruit or JVD.     Trachea: Trachea and phonation normal.  Cardiovascular:     Rate and Rhythm: Normal rate and regular rhythm.     Chest Wall: PMI is not displaced.     Pulses: Normal pulses.  Dorsalis pedis pulses are 2+ on the right side and 2+ on the left side.       Posterior tibial pulses are 2+ on the right side and 2+ on the left side.     Heart sounds: Normal heart sounds. No murmur heard.    No friction rub. No gallop.  Pulmonary:     Effort: Pulmonary effort is normal. No respiratory distress.     Breath sounds: Normal breath sounds. No wheezing.  Abdominal:     General: Bowel sounds are normal. There is no distension or abdominal bruit.     Palpations: Abdomen is soft. There is no hepatomegaly or splenomegaly.     Tenderness: There is no abdominal  tenderness. There is no right CVA tenderness or left CVA tenderness.     Hernia: No hernia is present.  Musculoskeletal:        General: Normal range of motion.     Cervical back: Normal range of motion and neck supple.     Right lower leg: No edema.     Left lower leg: No edema.  Feet:     Right foot:     Protective Sensation: 10 sites tested.  5 sites sensed.     Skin integrity: Callus and dry skin present.     Left foot:     Protective Sensation: 10 sites tested.  5 sites sensed.     Skin integrity: Callus and dry skin present.  Lymphadenopathy:     Cervical: No cervical adenopathy.  Skin:    General: Skin is warm and dry.     Capillary Refill: Capillary refill takes less than 2 seconds.     Coloration: Skin is not cyanotic, jaundiced or pale.     Findings: No rash.  Neurological:     General: No focal deficit present.     Mental Status: She is alert and oriented to person, place, and time.     Sensory: Sensation is intact.     Motor: Motor function is intact.     Coordination: Coordination is intact.     Gait: Gait is intact.     Deep Tendon Reflexes: Reflexes are normal and symmetric.  Psychiatric:        Attention and Perception: Attention and perception normal.        Mood and Affect: Mood and affect normal.        Speech: Speech is slurred.        Behavior: Behavior is slowed. Behavior is cooperative.        Thought Content: Thought content normal.        Cognition and Memory: Cognition and memory normal.        Judgment: Judgment normal.     Results for orders placed or performed in visit on 04/16/22  Microscopic Examination   Urine  Result Value Ref Range   WBC, UA >30 (A) 0 - 5 /hpf   RBC, Urine 0-2 0 - 2 /hpf   Epithelial Cells (non renal) 0-10 0 - 10 /hpf   Renal Epithel, UA None seen None seen /hpf   Bacteria, UA Many (A) None seen/Few   Yeast, UA Present (A) None seen  Urinalysis, Routine w reflex microscopic  Result Value Ref Range   Specific  Gravity, UA 1.025 1.005 - 1.030   pH, UA 6.0 5.0 - 7.5   Color, UA Yellow Yellow   Appearance Ur Clear Clear   Leukocytes,UA 1+ (A) Negative   Protein,UA Negative Negative/Trace  Glucose, UA Negative Negative   Ketones, UA Negative Negative   RBC, UA Trace (A) Negative   Bilirubin, UA Negative Negative   Urobilinogen, Ur 0.2 0.2 - 1.0 mg/dL   Nitrite, UA Positive (A) Negative   Microscopic Examination See below:        Pertinent labs & imaging results that were available during my care of the patient were reviewed by me and considered in my medical decision making.  Assessment & Plan:  Rin was seen today for diabetes.  Diagnoses and all orders for this visit:  Type 2 diabetes mellitus with hypoglycemia without coma, without long-term current use of insulin (HCC) A1C 7.5 in office today. Recent adjustments to regimen by PharmD. Repeat labs in 3 months, if still elevated, will make further adjustments to medications.  -     Lipid panel -     CBC with Differential/Platelet -     CMP14+EGFR -     Bayer DCA Hb A1c Waived -     For home use only DME Other see comment  Hypertension associated with diabetes ([place]) BP well controlled. Changes were not made in regimen today. Goal BP is 130/80. Pt aware to report any persistent high or low readings. DASH diet and exercise encouraged. Exercise at least 150 minutes per week and increase as tolerated. Goal BMI > 25. Stress management encouraged. Avoid nicotine and tobacco product use. Avoid excessive alcohol and NSAID's. Avoid more than 2000 mg of sodium daily. Medications as prescribed. Follow up as scheduled.  -     Lipid panel -     CBC with Differential/Platelet -     CMP14+EGFR -     Bayer DCA Hb A1c Waived  Hyperlipidemia associated with type 2 diabetes mellitus ([place]) Diet encouraged - increase intake of fresh fruits and vegetables, increase intake of lean proteins. Bake, broil, or grill foods. Avoid fried, greasy, and fatty foods.  Avoid fast foods. Increase intake of fiber-rich whole grains. Exercise encouraged - at least 150 minutes per week and advance as tolerated. Goal BMI < 25. Continue medications as prescribed. Follow up in 3-6 months as discussed.  -     Lipid panel -     CBC with Differential/Platelet -     CMP14+EGFR -     Bayer DCA Hb A1c Waived  Dry tooth socket Symptomatic relief discussed in detail. Use below as prescribed. Follow up with dentist as scheduled. Report new, worsening, or persistent symptoms.  -     Artificial Saliva (BIOTENE DRY MOUTH MOISTURIZING) SOLN; Use as directed 5 mLs in the mouth or throat in the morning and at bedtime.     Continue all other maintenance medications.  Follow up plan: Return in about 3 months (around 08/21/2022), or if symptoms worsen or fail to improve, for DM.   Continue healthy lifestyle choices, including diet (rich in fruits, vegetables, and lean proteins, and low in salt and simple carbohydrates) and exercise (at least 30 minutes of moderate physical activity daily).  Educational handout given for DM  The above assessment and management plan was discussed with the patient. The patient verbalized understanding of and has agreed to the management plan. Patient is aware to call the clinic if they develop any new symptoms or if symptoms persist or worsen. Patient is aware when to return to the clinic for a follow-up visit. Patient educated on when it is appropriate to go to the emergency department.   Monia Pouch, FNP-C [facility] [phone]

## 2022-05-23 LAB — CMP14+EGFR
ALT: 13 IU/L (ref 0–32)
AST: 18 IU/L (ref 0–40)
Albumin/Globulin Ratio: 2.3 — ABNORMAL HIGH (ref 1.2–2.2)
Albumin: 4.4 g/dL (ref 3.9–4.9)
Alkaline Phosphatase: 68 IU/L (ref 44–121)
BUN/Creatinine Ratio: 13 (ref 12–28)
BUN: 9 mg/dL (ref 8–27)
Bilirubin Total: 0.8 mg/dL (ref 0.0–1.2)
CO2: 26 mmol/L (ref 20–29)
Calcium: 9.7 mg/dL (ref 8.7–10.3)
Chloride: 101 mmol/L (ref 96–106)
Creatinine, Ser: 0.68 mg/dL (ref 0.57–1.00)
Globulin, Total: 1.9 g/dL (ref 1.5–4.5)
Glucose: 97 mg/dL (ref 70–99)
Potassium: 4.3 mmol/L (ref 3.5–5.2)
Sodium: 141 mmol/L (ref 134–144)
Total Protein: 6.3 g/dL (ref 6.0–8.5)
eGFR: 96 mL/min/{1.73_m2} (ref >59)

## 2022-05-23 LAB — CBC WITH DIFFERENTIAL/PLATELET
Basophils Absolute: 0.1 10*3/uL (ref 0.0–0.2)
Basos: 1 %
EOS (ABSOLUTE): 0.2 10*3/uL (ref 0.0–0.4)
Eos: 2 %
Hematocrit: 44.1 % (ref 34.0–46.6)
Hemoglobin: 15.1 g/dL (ref 11.1–15.9)
Immature Grans (Abs): 0 10*3/uL (ref 0.0–0.1)
Immature Granulocytes: 0 %
Lymphocytes Absolute: 2.4 10*3/uL (ref 0.7–3.1)
Lymphs: 26 %
MCH: 32.1 pg (ref 26.6–33.0)
MCHC: 34.2 g/dL (ref 31.5–35.7)
MCV: 94 fL (ref 79–97)
Monocytes Absolute: 0.6 10*3/uL (ref 0.1–0.9)
Monocytes: 7 %
Neutrophils Absolute: 5.7 10*3/uL (ref 1.4–7.0)
Neutrophils: 64 %
Platelets: 212 10*3/uL (ref 150–450)
RBC: 4.7 x10E6/uL (ref 3.77–5.28)
RDW: 12.7 % (ref 11.7–15.4)
WBC: 9 10*3/uL (ref 3.4–10.8)

## 2022-05-23 LAB — LIPID PANEL
Chol/HDL Ratio: 3.5 ratio (ref 0.0–4.4)
Cholesterol, Total: 150 mg/dL (ref 100–199)
HDL: 43 mg/dL (ref >39)
LDL Chol Calc (NIH): 80 mg/dL (ref 0–99)
Triglycerides: 156 mg/dL — ABNORMAL HIGH (ref 0–149)
VLDL Cholesterol Cal: 27 mg/dL (ref 5–40)

## 2022-05-23 LAB — BAYER DCA HB A1C WAIVED: HB A1C (BAYER DCA - WAIVED): 7.5 % — ABNORMAL HIGH (ref 4.8–5.6)

## 2022-05-24 ENCOUNTER — Telehealth: Payer: Self-pay | Admitting: Pharmacist

## 2022-05-24 NOTE — Telephone Encounter (Signed)
Patient requesting GI MD in [place].  It appears [facility] was denied and she does not know why.  Her previous GI at [facility] in [place] no longer is with the practice.  Can patient not be seen in [place]? Maybe the other office?  She needs colonoscopy.  I'm trying to help this patient that is having difficulty navigating the health system.  Does a new referral need to be placed?

## 2022-05-27 DIAGNOSIS — G4733 Obstructive sleep apnea (adult) (pediatric): Secondary | ICD-10-CM | POA: Diagnosis not present

## 2022-05-28 ENCOUNTER — Other Ambulatory Visit: Payer: Self-pay | Admitting: Family Medicine

## 2022-05-28 DIAGNOSIS — Z1211 Encounter for screening for malignant neoplasm of colon: Secondary | ICD-10-CM

## 2022-05-29 ENCOUNTER — Encounter (INDEPENDENT_AMBULATORY_CARE_PROVIDER_SITE_OTHER): Payer: Self-pay | Admitting: *Deleted

## 2022-05-29 DIAGNOSIS — G894 Chronic pain syndrome: Secondary | ICD-10-CM | POA: Diagnosis not present

## 2022-05-29 DIAGNOSIS — M5412 Radiculopathy, cervical region: Secondary | ICD-10-CM | POA: Diagnosis not present

## 2022-05-29 DIAGNOSIS — M5030 Other cervical disc degeneration, unspecified cervical region: Secondary | ICD-10-CM | POA: Diagnosis not present

## 2022-05-29 DIAGNOSIS — M5416 Radiculopathy, lumbar region: Secondary | ICD-10-CM | POA: Diagnosis not present

## 2022-05-29 DIAGNOSIS — M5136 Other intervertebral disc degeneration, lumbar region: Secondary | ICD-10-CM | POA: Diagnosis not present

## 2022-06-01 DIAGNOSIS — G4733 Obstructive sleep apnea (adult) (pediatric): Secondary | ICD-10-CM | POA: Diagnosis not present

## 2022-06-05 DIAGNOSIS — K0889 Other specified disorders of teeth and supporting structures: Secondary | ICD-10-CM | POA: Diagnosis not present

## 2022-06-17 ENCOUNTER — Telehealth: Payer: Self-pay | Admitting: Cardiovascular Disease

## 2022-06-17 NOTE — Telephone Encounter (Signed)
Returned call to patient who states that her sister had some concerns regarding some testing she received in the mail. Asked patient which testing and she was unsure. She is unsure whether its in regard to the vascular testing or the Echo, advised patient that per chart review and results from Dr. Audie Box- echo is normal. Patient very confused on the phone and states that she is going to call her sister and then call back to clarify which testing she has concerns with. Gave patient call back number.

## 2022-06-17 NOTE — Telephone Encounter (Signed)
Patient is requesting a referral to Dr. Monica Martinez with [facility].

## 2022-06-20 ENCOUNTER — Other Ambulatory Visit: Payer: Self-pay

## 2022-06-20 ENCOUNTER — Other Ambulatory Visit: Payer: Self-pay | Admitting: Neurology

## 2022-06-27 DIAGNOSIS — G4733 Obstructive sleep apnea (adult) (pediatric): Secondary | ICD-10-CM | POA: Diagnosis not present

## 2022-07-01 ENCOUNTER — Telehealth (INDEPENDENT_AMBULATORY_CARE_PROVIDER_SITE_OTHER): Payer: Self-pay | Admitting: *Deleted

## 2022-07-01 NOTE — Telephone Encounter (Signed)
Received VM from pt. She stated her provider was supposed to send over referral for her to have both colonoscopy/EGD at the same time. I see where she was mailed a questionnaire. Please advise Lelon Frohlich thanks!

## 2022-07-01 NOTE — Telephone Encounter (Signed)
Her referral was for screening colonoscopy, I don't see anything in her last OV note from PCP about needing EGD

## 2022-07-01 NOTE — Telephone Encounter (Signed)
Called pt, no answer and line rang numerous times.

## 2022-07-02 ENCOUNTER — Other Ambulatory Visit: Payer: Self-pay | Admitting: Family Medicine

## 2022-07-02 DIAGNOSIS — Z1231 Encounter for screening mammogram for malignant neoplasm of breast: Secondary | ICD-10-CM

## 2022-07-02 DIAGNOSIS — G4733 Obstructive sleep apnea (adult) (pediatric): Secondary | ICD-10-CM | POA: Diagnosis not present

## 2022-07-02 NOTE — Telephone Encounter (Signed)
Referral closed out

## 2022-07-02 NOTE — Telephone Encounter (Signed)
Spoke with pt. She is going to have PCP send over a referral for her to have TCS/EGD done at the same time. Reports she has issues with things getting stuck in throat. FYI to ann to close out previous referral for TCS as pt wants TCS/EGD.

## 2022-07-04 ENCOUNTER — Other Ambulatory Visit: Payer: Self-pay | Admitting: Pulmonary Disease

## 2022-07-04 NOTE — Telephone Encounter (Signed)
Patient changed to Anoro from Trelegy

## 2022-07-05 ENCOUNTER — Telehealth: Payer: Self-pay | Admitting: *Deleted

## 2022-07-05 ENCOUNTER — Other Ambulatory Visit: Payer: Self-pay

## 2022-07-05 DIAGNOSIS — J441 Chronic obstructive pulmonary disease with (acute) exacerbation: Secondary | ICD-10-CM

## 2022-07-05 MED ORDER — ANORO ELLIPTA 62.5-25 MCG/ACT IN AEPB: 1.0000 | INHALATION_SPRAY | Freq: Every day | RESPIRATORY_TRACT | 5 refills | Status: DC

## 2022-07-05 MED ORDER — ALBUTEROL SULFATE HFA 108 (90 BASE) MCG/ACT IN AERS: 2.0000 | INHALATION_SPRAY | Freq: Four times a day (QID) | RESPIRATORY_TRACT | 2 refills | Status: DC | PRN

## 2022-07-05 NOTE — Telephone Encounter (Signed)
Refills on albuterol and Anoro have been sent to [facility], nothing further needed.

## 2022-07-08 ENCOUNTER — Ambulatory Visit
Admission: RE | Admit: 2022-07-08 | Discharge: 2022-07-08 | Disposition: A | Discharge status: Home / Self Care | Payer: 59 | Source: Ambulatory Visit | Attending: Family Medicine | Admitting: Family Medicine

## 2022-07-08 DIAGNOSIS — Z1231 Encounter for screening mammogram for malignant neoplasm of breast: Secondary | ICD-10-CM | POA: Diagnosis not present

## 2022-07-10 ENCOUNTER — Other Ambulatory Visit: Payer: Self-pay

## 2022-07-10 DIAGNOSIS — R928 Other abnormal and inconclusive findings on diagnostic imaging of breast: Secondary | ICD-10-CM

## 2022-07-10 DIAGNOSIS — N6489 Other specified disorders of breast: Secondary | ICD-10-CM

## 2022-07-10 DIAGNOSIS — N6320 Unspecified lump in the left breast, unspecified quadrant: Secondary | ICD-10-CM

## 2022-07-11 DIAGNOSIS — E1165 Type 2 diabetes mellitus with hyperglycemia: Secondary | ICD-10-CM | POA: Diagnosis not present

## 2022-07-16 DIAGNOSIS — M5416 Radiculopathy, lumbar region: Secondary | ICD-10-CM | POA: Diagnosis not present

## 2022-07-16 DIAGNOSIS — G894 Chronic pain syndrome: Secondary | ICD-10-CM | POA: Diagnosis not present

## 2022-07-16 DIAGNOSIS — M5412 Radiculopathy, cervical region: Secondary | ICD-10-CM | POA: Diagnosis not present

## 2022-07-17 ENCOUNTER — Telehealth: Payer: Self-pay | Admitting: Family Medicine

## 2022-07-17 NOTE — Telephone Encounter (Signed)
Re faxed- patient aware

## 2022-07-18 ENCOUNTER — Telehealth: Payer: Self-pay | Admitting: Family Medicine

## 2022-07-18 NOTE — Telephone Encounter (Signed)
Note from GI referral :  Spoke with pt. She is going to have PCP send over a referral for her to have TCS/EGD done at the same time. Reports she has issues with things getting stuck in throat. FYI to ann to close out previous referral for TCS as pt wants TCS/EGD.

## 2022-07-19 ENCOUNTER — Other Ambulatory Visit: Payer: Self-pay | Admitting: Family Medicine

## 2022-07-19 ENCOUNTER — Other Ambulatory Visit: Payer: Self-pay | Admitting: Neurology

## 2022-07-19 DIAGNOSIS — Z8619 Personal history of other infectious and parasitic diseases: Secondary | ICD-10-CM

## 2022-07-23 ENCOUNTER — Ambulatory Visit (HOSPITAL_COMMUNITY)
Admission: RE | Admit: 2022-07-23 | Discharge: 2022-07-23 | Disposition: A | Discharge status: Home / Self Care | Payer: 59 | Source: Ambulatory Visit | Attending: Family Medicine | Admitting: Family Medicine

## 2022-07-23 DIAGNOSIS — N6489 Other specified disorders of breast: Secondary | ICD-10-CM | POA: Insufficient documentation

## 2022-07-23 DIAGNOSIS — R928 Other abnormal and inconclusive findings on diagnostic imaging of breast: Secondary | ICD-10-CM | POA: Insufficient documentation

## 2022-07-23 DIAGNOSIS — N6320 Unspecified lump in the left breast, unspecified quadrant: Secondary | ICD-10-CM | POA: Insufficient documentation

## 2022-07-25 DIAGNOSIS — M5412 Radiculopathy, cervical region: Secondary | ICD-10-CM | POA: Diagnosis not present

## 2022-07-28 DIAGNOSIS — G4733 Obstructive sleep apnea (adult) (pediatric): Secondary | ICD-10-CM | POA: Diagnosis not present

## 2022-08-02 DIAGNOSIS — G4733 Obstructive sleep apnea (adult) (pediatric): Secondary | ICD-10-CM | POA: Diagnosis not present

## 2022-08-05 ENCOUNTER — Telehealth: Payer: Self-pay | Admitting: Family Medicine

## 2022-08-05 NOTE — Telephone Encounter (Signed)
Called patient to schedule Medicare Annual Wellness Visit (AWV). Left message for patient to call back and schedule Medicare Annual Wellness Visit (AWV).  Last date of AWV: due 12/08/2021 awvi per [facility]  Please schedule an appointment at any time with either Mickel Baas or [facility], [facility]. .  If any questions, please contact me at [phone].  Thank you,  Colletta Maryland,  [facility] Direct Dial ??[phone]

## 2022-08-07 ENCOUNTER — Other Ambulatory Visit: Payer: Self-pay | Admitting: Family Medicine

## 2022-08-20 ENCOUNTER — Ambulatory Visit (INDEPENDENT_AMBULATORY_CARE_PROVIDER_SITE_OTHER): Payer: 59 | Admitting: Pharmacist

## 2022-08-20 DIAGNOSIS — E11649 Type 2 diabetes mellitus with hypoglycemia without coma: Secondary | ICD-10-CM

## 2022-08-20 MED ORDER — TIRZEPATIDE 7.5 MG/0.5ML ~~LOC~~ SOAJ: 7.5000 mg | SUBCUTANEOUS | 5 refills | Status: DC

## 2022-08-20 NOTE — Progress Notes (Signed)
         Pharmacy Note 08/20/2022 Name:  Cynthia Deleon    MRN:  [id number]      DOB:  [birth year]   Summary:   Diabetes: uncontrolled- A1C 7.5% - BLOOD SUGARS increasing; current treatment: GLIPIZIDE, TRULICITY 4.5mg  SQ WEEKLY;  INTOLERANCES/PAST MEDS METFORMIN (DUE TO GI) DISCONTINUED FARXIGA DUE TO RECURRENT UTI/YEAST INFECTIONS DESPITE CONTROLLED SUGARS JANUVIA DISCONTINUED (due to trulicity) D/C'D OZEMPIC DUE TO GI SIDE EFFECTS (TOLERATED TRULICITY) TRANSITION TRULICITY TO MOUNJARO 7.5MG  Denies personal and family history of Medullary thyroid cancer ([facility]) Sliding scale humalog given due to recurrent steroid injections May have to start basal insulin Will follow closely CONTINUE GLIPIZIDE  Current glucose readings: fasting glucose: up to 180, post prandial glucose: >200 MUCH IMPROVED Patient would like to continue libre 2 CGM Extensive education provided on FS libre 2 CGM system; pt able to demonstrated and teachback Denies hypoglycemic symptoms Discussed meal planning options and Plate method for healthy eating--Patient admits to not eating wisely, she is trying to work on this Avoid sugary drinks and desserts Incorporate balanced protein, non starchy veggies, 1 serving of carbohydrate with each meal Increase water intake Increase physical activity as able Current exercise: active job  Educated on current medications, patient on pill packaging with [employer], cont glipizide, LIBRE Recommended annual foot exam--prefers dr. Ulice Brilliant in [facility]         Lipid Panel     Component Value Date/Time   CHOL 150 05/22/2022 1531   TRIG 156 (H) 05/22/2022 1531   HDL 43 05/22/2022 1531   CHOLHDL 3.5 05/22/2022 1531   CHOLHDL 4.1 11/16/2015 0715   VLDL 48 (H) 11/16/2015 0715   LDLCALC 80 05/22/2022 1531   LABVLDL 27 05/22/2022 1531      Kieth Brightly, PharmD, BCPS Clinical Pharmacist, [facility] [place]  II Phone  [phone]

## 2022-08-21 ENCOUNTER — Ambulatory Visit (INDEPENDENT_AMBULATORY_CARE_PROVIDER_SITE_OTHER): Payer: 59

## 2022-08-21 VITALS — Ht 69.0 in | Wt 180.0 lb

## 2022-08-21 DIAGNOSIS — Z0000 Encounter for general adult medical examination without abnormal findings: Secondary | ICD-10-CM | POA: Diagnosis not present

## 2022-08-21 NOTE — Progress Notes (Signed)
Subjective:   Cynthia Deleon is a 67 y.o. female who presents for an Initial Medicare Annual Wellness Visit. I connected with  Cynthia Deleon on 08/21/22 by a audio enabled telemedicine application and verified that I am speaking with the correct person using two identifiers.  Patient Location: Home  Provider Location: Home Office  I discussed the limitations of evaluation and management by telemedicine. The patient expressed understanding and agreed to proceed.  Review of Systems     Cardiac Risk Factors include: advanced age (>26mn, >>19women);diabetes mellitus;dyslipidemia     Objective:    Today's Vitals   08/21/22 1323  Weight: 180 lb (81.6 kg)  Height: '5\' 9"'$  (1.753 m)   Body mass index is 26.58 kg/m.     08/21/2022    1:30 PM 11/21/2021    2:00 PM 10/26/2021   10:00 PM 10/26/2021    8:41 AM 04/27/2020   11:17 AM 06/15/2018   11:45 AM 04/22/2018   12:57 PM  Advanced Directives  Does Patient Have a Medical Advance Directive? Yes Yes Yes Yes No No No  Type of [place] Power of [place] will     Does patient want to make changes to medical advance directive?  No - Guardian declined No - Patient declined      Copy of HElyriain Chart? No - copy requested        Would patient like information on creating a medical advance directive?     Yes (Inpatient - patient defers creating a medical advance directive at this time - Information given) No - Patient declined No - Patient declined    Current Medications (verified) Outpatient Encounter Medications as of 08/21/2022  Medication Sig   albuterol (VENTOLIN HFA) 108 (90 Base) MCG/ACT inhaler Inhale 2 puffs into the lungs every 6 (six) hours as needed.   Artificial Saliva (BIOTENE DRY MOUTH MOISTURIZING) SOLN Use as directed 5 mLs in the mouth or throat in the morning and at  bedtime.   aspirin EC 81 MG tablet Take 81 mg by mouth daily. Swallow whole.   atorvastatin (LIPITOR) 80 MG tablet Take 1 tablet (80 mg total) by mouth daily.   cholecalciferol (VITAMIN D) 25 MCG tablet Take 1 tablet (1,000 Units total) by mouth daily.   Continuous Blood Gluc Sensor (FREESTYLE LIBRE 2 SENSOR) MISC Apply to arm every 14 days to check blood sugar continuously. DX E11.65   diazepam (VALIUM) 5 MG tablet Take 5 mg by mouth 2 (two) times daily.   DULoxetine (CYMBALTA) 60 MG capsule TAKE (1) CAPSULE BY MOUTH EVERY DAY.   estrogens, conjugated, (PREMARIN) 0.3 MG tablet TAKE (1) TABLET BY MOUTH ONCE DAILY.   ezetimibe (ZETIA) 10 MG tablet TAKE (1) TABLET BY MOUTH ONCE DAILY.   fluconazole (DIFLUCAN) 150 MG tablet 1 po q week x 4 weeks   fluorometholone (FML) 0.1 % ophthalmic suspension SMARTSIG:In Eye(s)   fluticasone (FLONASE) 50 MCG/ACT nasal spray USE 2 SPRAYS IN EACH NOSTRIL ONCE DAILY. (Patient taking differently: Place 2 sprays into both nostrils daily.)   glipiZIDE (GLUCOTROL XL) 5 MG 24 hr tablet Take 1 tablet (5 mg total) by mouth 2 (two) times daily with a meal.   glucose blood (CONTOUR NEXT TEST) test strip Test BS 4 times daily Dx E11.9   insulin lispro (HUMALOG KWIKPEN) 100 UNIT/ML KwikPen Use 5-10 units 2 times daily with breakfast and  dinner when blood sugar is >200 on steroids   Insulin Pen Needle (PIP PEN NEEDLES 32G X 4MM) 32G X 4 MM MISC Use to inject insulin 2 times daily as needed. DX E11.65   lamoTRIgine (LAMICTAL) 200 MG tablet Take 200 mg by mouth at bedtime. For mood disorder.   lansoprazole (PREVACID) 30 MG capsule Take 1 capsule (30 mg total) by mouth daily in the afternoon.   levocetirizine (XYZAL) 5 MG tablet Take 1 tablet (5 mg total) by mouth every evening.   lisinopril (ZESTRIL) 5 MG tablet Take 1 tablet (5 mg total) by mouth daily.   meclizine (ANTIVERT) 25 MG tablet Take 2 tablets (50 mg total) by mouth daily as needed for dizziness.   mirabegron ER  (MYRBETRIQ) 50 MG TB24 tablet Take 1 tablet (50 mg total) by mouth daily.   montelukast (SINGULAIR) 10 MG tablet TAKE (1) TABLET BY MOUTH AT BEDTIME.   OneTouch Delica Lancets 99991111 MISC Use to test blood sugar twice daily. DX: E11.9   oxyCODONE (ROXICODONE) 15 MG immediate release tablet Take 1 tablet (15 mg total) by mouth every 6 (six) hours as needed for pain.   pregabalin (LYRICA) 150 MG capsule Take 1 capsule (150 mg total) by mouth 3 (three) times daily.   promethazine (PHENERGAN) 12.5 MG tablet Take 1 tablet (12.5 mg total) by mouth every 8 (eight) hours as needed for nausea or vomiting.   tirzepatide (MOUNJARO) 7.5 MG/0.5ML Pen Inject 7.5 mg into the skin once a week. DX: E11.65   umeclidinium-vilanterol (ANORO ELLIPTA) 62.5-25 MCG/ACT AEPB Inhale 1 puff into the lungs daily.   valACYclovir (VALTREX) 500 MG tablet TAKE (1) TABLET BY MOUTH ONCE DAILY AS NEEDED FOR SHINGLES FLARE UP.   vitamin B-12 1000 MCG tablet Take 1 tablet (1,000 mcg total) by mouth daily.   No facility-administered encounter medications on file as of 08/21/2022.    Allergies (verified) Chantix [varenicline], Divalproex sodium, Other, Pseudoeph-hydrocodone-gg, Sulfa antibiotics, Varenicline tartrate, Ambien [zolpidem], Clavulanic acid, Codeine, Elemental sulfur, Hydrocod poli-chlorphe poli er, Hydrocodone, Macrobid [nitrofurantoin], Paxil [paroxetine hcl], Prednisone, Amoxicillin, Farxiga [dapagliflozin], Metformin and related, and Tape   History: Past Medical History:  Diagnosis Date   Aortic atherosclerosis ([place]) 10/31/2021   Arthritis    hands and knees   Bipolar 1 disorder (HCC)    Borderline diabetic    Chronic back pain    Chronic neck pain    Complication of anesthesia    high anxiety-does not want to be alone   COPD (chronic obstructive pulmonary disease) ([place])    Current use of estrogen therapy 01/12/2014   Depression    Diabetes mellitus without complication ([place])    Diplopia    Family history of  adverse reaction to anesthesia    sister "gas in lungs"   GERD (gastroesophageal reflux disease)    Headache    Hepatic steatosis 10/31/2021   Hot flashes 12/15/2013   Hypertension    IBS (irritable bowel syndrome)    Incontinence    Kidney stone    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[place])    Osteopenia 02/06/2022   Panic attacks    Peptic ulcer    Peripheral neuropathy    Rosacea    Shingles    Sleep apnea    uses a cpap-with oxygen   Past Surgical History:  Procedure Laterality Date   BIOPSY  04/27/2020   Procedure: BIOPSY;  Surgeon: Ronald Lobo, MD;  Location: [facility];  Service: Endoscopy;;   BUNIONECTOMY WITH HAMMERTOE RECONSTRUCTION Right 12/10/2012  Procedure: RIGHT FIRST METATARSAL CHEVRON BUNION CORRECTION,  2 AND 3 HAMMERTOE CORRECTION , RIGHT 3 AND 4 TOE NAIL EXCISION ;  Surgeon: Wylene Simmer, MD;  Location: [facility];  Service: Orthopedics;  Laterality: Right;   COLONOSCOPY WITH PROPOFOL N/A 04/27/2020   Procedure: COLONOSCOPY WITH PROPOFOL;  Surgeon: Ronald Lobo, MD;  Location: [facility];  Service: Endoscopy;  Laterality: N/A;   FOOT ARTHRODESIS  2000   both feet   LIPOSUCTION  03/2018   RECTAL SURGERY     TONSILLECTOMY     TOTAL ABDOMINAL HYSTERECTOMY     Family History  Problem Relation Age of Onset   Diabetes Mother    Other Mother        vertigo; chronic eye disease   COPD Father    Heart attack Sister    COPD Sister    Diabetes Sister    Other Sister        hearing problems   Hyperlipidemia Sister    Diabetes Maternal Grandmother    Diabetes Maternal Grandfather    Alcohol abuse Brother    Alcohol abuse Brother    COPD Brother    Alcohol abuse Brother    Alcohol abuse Brother    Other Brother        aneursym   Breast cancer Neg Hx    Social History   Socioeconomic History   Marital status: Divorced    Spouse name: Not on file   Number of children: 5   Years of education: 9th   Highest education level: Not on  file  Occupational History   Occupation: Disabled  Tobacco Use   Smoking status: Former    Packs/day: 1.00    Years: 42.00    Total pack years: 42.00    Types: Cigarettes    Quit date: 06/10/2017    Years since quitting: 5.2   Smokeless tobacco: Never  Vaping Use   Vaping Use: Never used  Substance and Sexual Activity   Alcohol use: No    Alcohol/week: 0.0 standard drinks of alcohol   Drug use: No   Sexual activity: Never    Birth control/protection: Surgical    Comment: occ cigarette  Other Topics Concern   Not on file  Social History Narrative   Lives at home with home with her son (has five children).   Right-handed.   1 cup caffeine per day.   Social Determinants of Health   Financial Resource Strain: Low Risk  (08/21/2022)   Overall Financial Resource Strain (CARDIA)    Difficulty of Paying Living Expenses: Not hard at all  Food Insecurity: No Food Insecurity (08/21/2022)   Hunger Vital Sign    Worried About Running Out of Food in the Last Year: Never true    Ran Out of Food in the Last Year: Never true  Transportation Needs: No Transportation Needs (08/21/2022)   PRAPARE - Hydrologist (Medical): No    Lack of Transportation (Non-Medical): No  Physical Activity: Inactive (08/21/2022)   Exercise Vital Sign    Days of Exercise per Week: 0 days    Minutes of Exercise per Session: 0 min  Stress: No Stress Concern Present (08/21/2022)   [place]    Feeling of Stress : Not at all  Social Connections: Moderately Integrated (08/21/2022)   Social Connection and Isolation Panel [NHANES]    Frequency of Communication with Friends and Family: More than three times  a week    Frequency of Social Gatherings with Friends and Family: More than three times a week    Attends Religious Services: More than 4 times per year    Active Member of [employer] or Organizations: Yes    Attends Programme researcher, broadcasting/film/video: More than 4 times per year    Marital Status: Divorced    Tobacco Counseling Counseling given: Not Answered   Clinical Intake:  Pre-visit preparation completed: Yes  Pain : No/denies pain     Nutritional Risks: None Diabetes: No  How often do you need to have someone help you when you read instructions, pamphlets, or other written materials from your doctor or pharmacy?: 1 - Never  Diabetic?yes   Interpreter Needed?: No Nutrition Risk Assessment:  Has the patient had any N/V/D within the last 2 months?  No  Does the patient have any non-healing wounds?  No  Has the patient had any unintentional weight loss or weight gain?  No   Diabetes:  Is the patient diabetic?  Yes  If diabetic, was a CBG obtained today?  No  Did the patient bring in their glucometer from home?  No  How often do you monitor your CBG's? [name].   Financial Strains and Diabetes Management:  Are you having any financial strains with the device, your supplies or your medication? No .  Does the patient want to be seen by Chronic Care Management for management of their diabetes?  No  Would the patient like to be referred to a Nutritionist or for Diabetic Management?  No   Diabetic Exams:  Diabetic Eye Exam: Completed 01/2022 Diabetic Foot Exam: Overdue, Pt has been advised about the importance in completing this exam. Pt is scheduled for diabetic foot exam on next office visit .  Information entered by :: Jadene Pierini, LPN   Activities of Daily Living    08/21/2022    1:30 PM 11/21/2021    2:00 PM  In your present state of health, do you have any difficulty performing the following activities:  Hearing? 0 0  Vision? 0 0  Difficulty concentrating or making decisions? 0 0  Walking or climbing stairs? 0 1  Dressing or bathing? 0 1  Doing errands, shopping? 0 0  Preparing Food and eating ? N   Using the Toilet? N   In the past six months, have you accidently leaked urine? N    Do you have problems with loss of bowel control? N   Managing your Medications? N   Managing your Finances? N   Housekeeping or managing your Housekeeping? N     Patient Care Team: Baruch Gouty, FNP as PCP - General (Family Medicine) Suella Broad, MD as Consulting Physician (Physical Medicine and Rehabilitation) Norma Fredrickson, MD as Consulting Physician (Psychiatry) Phillips Odor, MD as Consulting Physician (Neurology) Lavera Guise, [facility] (Pharmacist) Warren Danes, PA-C as Physician Assistant (Dermatology) Madelin Headings, DO (Optometry)  Indicate any recent Medical Services you may have received from other than [facility] providers in the past year (date may be approximate).     Assessment:   This is a routine wellness examination for Jurea.  Hearing/Vision screen Vision Screening - Comments:: Wears rx glasses - up to date with routine eye exams with  Dr.Carter  Dietary issues and exercise activities discussed: Current Exercise Habits: The patient does not participate in regular exercise at present, Exercise limited by: None identified   Goals Addressed  This Visit's Progress    Exercise 3x per week (30 min per time)         Depression Screen    08/21/2022    1:29 PM 02/06/2022   11:00 AM 12/07/2021    1:31 PM 09/13/2021   10:43 AM 08/30/2021    9:38 AM 08/24/2021    4:08 PM 05/24/2021   10:43 AM  PHQ 2/9 Scores  PHQ - 2 Score 0 4 0 0 2 0 1  PHQ- 9 Score  14 0 '2 8 6 11    '$ Fall Risk    08/21/2022    1:25 PM 02/06/2022   11:00 AM 09/13/2021   10:43 AM 08/30/2021    9:38 AM 08/24/2021    4:08 PM  Fall Risk   Falls in the past year? 0 '1 1 1 1  '$ Number falls in past yr: 0 '1 1 1 1  '$ Injury with Fall? 0 0 '1 1 1  '$ Risk for fall due to : No Fall Risks      Follow up Falls prevention discussed Falls prevention discussed Falls prevention discussed Falls prevention discussed Falls prevention discussed    FALL RISK PREVENTION PERTAINING TO THE HOME:  Any  stairs in or around the home? No  If so, are there any without handrails? No  Home free of loose throw rugs in walkways, pet beds, electrical cords, etc? Yes  Adequate lighting in your home to reduce risk of falls? Yes   ASSISTIVE DEVICES UTILIZED TO PREVENT FALLS:  Life alert? No  Use of a cane, walker or w/c? Yes  Grab bars in the bathroom? No  Shower chair or bench in shower? No  Elevated toilet seat or a handicapped toilet? No    Immunizations Immunization History  Administered Date(s) Administered   DTaP 02/23/1957, 03/23/1957, 04/27/1957, 10/11/1959   Fluad Quad(high Dose 65+) 03/08/2021, 03/07/2022   IPV 10/26/1957, 03/14/1961, 02/13/1962, 11/22/1965   Influenza Split 05/08/2004, 05/09/2005, 03/26/2016, 02/17/2019   Influenza,inj,Quad PF,6+ Mos 02/18/2017, 02/17/2019   Influenza,inj,quad, With Preservative 04/03/2015, 04/10/2018, 02/17/2019   Influenza-Unspecified 03/10/2020   Moderna SARS-COV2 Booster Vaccination 06/07/2020   Moderna Sars-Covid-2 Vaccination 08/19/2019, 09/23/2019   PNEUMOCOCCAL CONJUGATE-20 03/29/2021   Pneumococcal Conjugate-13 03/24/2014, 10/01/2017   Pneumococcal Polysaccharide-23 04/29/2007   Smallpox 10/11/1959   Tdap 11/20/2017   Zoster Recombinat (Shingrix) 03/29/2021   Zoster, Live 04/16/2013    TDAP status: Up to date  Flu Vaccine status: Up to date  Pneumococcal vaccine status: Up to date  Covid-19 vaccine status: Completed vaccines  Qualifies for Shingles Vaccine? Yes   Zostavax completed Yes   Shingrix Completed?: Yes  Screening Tests Health Maintenance  Topic Date Due   COVID-19 Vaccine (3 - Moderna risk series) 07/05/2020   OPHTHALMOLOGY EXAM  07/26/2022   Zoster Vaccines- Shingrix (2 of 2) 08/21/2022 (Originally 05/24/2021)   Hepatitis C Screening  05/23/2023 (Originally 12/29/1973)   HEMOGLOBIN A1C  11/21/2022   Lung Cancer Screening  12/11/2022   Diabetic kidney evaluation - Urine ACR  02/07/2023   Diabetic kidney  evaluation - eGFR measurement  05/23/2023   FOOT EXAM  05/23/2023   MAMMOGRAM  07/09/2023   Medicare Annual Wellness (AWV)  08/21/2023   DEXA SCAN  02/06/2025   COLONOSCOPY (Pts 45-18yr Insurance coverage will need to be confirmed)  04/27/2025   DTaP/Tdap/Td (6 - Td or Tdap) 11/21/2027   Pneumonia Vaccine 67 Years old  Completed   INFLUENZA VACCINE  Completed   HPV VACCINES  Aged Out  Health Maintenance  Health Maintenance Due  Topic Date Due   COVID-19 Vaccine (3 - Moderna risk series) 07/05/2020   OPHTHALMOLOGY EXAM  07/26/2022    Colorectal cancer screening: Type of screening: Colonoscopy. Completed 04/27/2020. Repeat every 10 years  Mammogram status: Completed 07/08/2022. Repeat every year  Bone Density status: Completed 02/06/2022. Results reflect: Bone density results: OSTEOPOROSIS. Repeat every 3 years.  Lung Cancer Screening: (Low Dose CT Chest recommended if Age 84-80 years, 30 pack-year currently smoking OR have quit w/in 15years.) does not qualify.   Lung Cancer Screening Referral: n/a  Additional Screening:  Hepatitis C Screening: does not qualify   Vision Screening: Recommended annual ophthalmology exams for early detection of glaucoma and other disorders of the eye. Is the patient up to date with their annual eye exam?  Yes  Who is the provider or what is the name of the office in which the patient attends annual eye exams? Dr.Carter  If pt is not established with a provider, would they like to be referred to a provider to establish care? No .   Dental Screening: Recommended annual dental exams for proper oral hygiene  Community Resource Referral / Chronic Care Management: CRR required this visit?  No   CCM required this visit?  No      Plan:     I have personally reviewed and noted the following in the patient's chart:   Medical and social history Use of alcohol, tobacco or illicit drugs  Current medications and supplements including opioid  prescriptions. Patient is not currently taking opioid prescriptions. Functional ability and status Nutritional status Physical activity Advanced directives List of other physicians Hospitalizations, surgeries, and ER visits in previous 12 months Vitals Screenings to include cognitive, depression, and falls Referrals and appointments  In addition, I have reviewed and discussed with patient certain preventive protocols, quality metrics, and best practice recommendations. A written personalized care plan for preventive services as well as general preventive health recommendations were provided to patient.     Daphane Shepherd, LPN   [id number]   Nurse Notes: none

## 2022-08-21 NOTE — Patient Instructions (Signed)
Cynthia Deleon , Thank you for taking time to come for your Medicare Wellness Visit. I appreciate your ongoing commitment to your health goals. Please review the following plan we discussed and let me know if I can assist you in the future.   These are the goals we discussed:  Goals       Exercise 3x per week (30 min per time)      T2DM, HTN, HLD-PharmD GOAL (pt-stated)      Current Barriers:  Unable to independently afford treatment regimen Unable to achieve control of T2DM   Pharmacist Clinical Goal(s):  Over the next 90 days, patient will verbalize ability to afford treatment regimen achieve control of T2DM as evidenced by IMPROVED GLYCEMIC CONTROL through collaboration with PharmD and provider.   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) DISCONTINUED FARXIGA DUE TO RECURRENT UTI/YEAST INFECTIONS DESPITE CONTROLLED SUGARS D/C'D OZEMPIC DUE TO GI SIDE EFFECTS (TOLERATES TRULICITY) INCREASED TRULICITY TO '3MG'$  WEEKLY Denies personal and family history of Medullary thyroid cancer (MTC) PATIENT HAS BEEN CONTINUING TO TAKE JANUVIA DESPITE COUNSELING TO STOP SHE DENIES ADDITIONAL SIDE EFFECTS AND REPORTS JANUVIA IS HELPING HER CONTINUE GLIPIZIDE  Current glucose readings: fasting glucose: <130, post prandial glucose: <200 MUCH IMPROVED Patient received her Libre 2 in the [place] Extensive education provided on FS libre 2 CGM system; pt able to demonstrated and  teachback INSTRUCTED HOW TO ORDER REFILLS via [employer] supply Telephone: [phone] Denies hypoglycemic/hyperglycemic symptoms Discussed meal planning options and Plate method for healthy eating--Patient admits to not eating wisely, she is trying to work on this Avoid sugary drinks and desserts Incorporate balanced protein, non starchy veggies, 1 serving of carbohydrate with each meal Increase water intake Increase physical activity as able Current exercise: active job  Educated on current medications, patient on pill packaging with [place], INCREASE GLIPIZIDE, [place] Assisted patient with [facility] -- will be mailed to patient's house Needs referral to podiatry--prefers dr. Irving Shows in [place]  Discussed Blood pressure and cholesterol--stable, continue current medications as prescribed, patient using pill packs monthly May titrate/switch statin if LDL not at goal at f/u-->would prefer rosuvastatin vs atorva (pt less likely to have myopathy which she sometimes reports)  Patient Goals/Self-Care Activities Over the next 90 days, patient will:  - take medications as prescribed check glucose daily (fasting) or if symptomatic, document, and provide at future appointments  Follow Up Plan: Telephone follow up appointment with care management team member scheduled for: 1 month         This is a list of the screening recommended for you and due dates:  Health Maintenance  Topic Date Due   COVID-19 Vaccine (3 - Moderna risk series) 07/05/2020   Eye exam for diabetics  07/26/2022   Zoster (Shingles) Vaccine (2 of 2) 08/21/2022*   Hepatitis C Screening: USPSTF Recommendation to screen - Ages 18-79 yo.  05/23/2023*   Hemoglobin A1C  11/21/2022   Screening for Lung Cancer  12/11/2022   Yearly kidney health urinalysis for diabetes  02/07/2023   Yearly kidney function blood test for diabetes  05/23/2023  Complete foot exam   05/23/2023    Mammogram  07/09/2023   Medicare Annual Wellness Visit  08/21/2023   DEXA scan (bone density measurement)  02/06/2025   Colon Cancer Screening  04/27/2025   DTaP/Tdap/Td vaccine (6 - Td or Tdap) 11/21/2027   Pneumonia Vaccine  Completed   Flu Shot  Completed   HPV Vaccine  Aged Out  *Topic was postponed. The date shown is not the original due date.    Advanced directives: Advance directive discussed with you today. I have provided a copy for you to complete at home and have notarized. Once this is complete please bring a copy in to our office so we can scan it into your chart.   Conditions/risks identified: Aim for 30 minutes of exercise or brisk walking, 6-8 glasses of water, and 5 servings of fruits and vegetables each day.   Next appointment: Follow up in one year for your annual wellness visit    Preventive Care 65 Years and Older, Female Preventive care refers to lifestyle choices and visits with your health care provider that can promote health and wellness. What does preventive care include? A yearly physical exam. This is also called an annual well check. Dental exams once or twice a year. Routine eye exams. Ask your health care provider how often you should have your eyes checked. Personal lifestyle choices, including: Daily care of your teeth and gums. Regular physical activity. Eating a healthy diet. Avoiding tobacco and drug use. Limiting alcohol use. Practicing safe sex. Taking low-dose aspirin every day. Taking vitamin and mineral supplements as recommended by your health care provider. What happens during an annual well check? The services and screenings done by your health care provider during your annual well check will depend on your age, overall health, lifestyle risk factors, and family history of disease. Counseling  Your health care provider may ask you questions about your: Alcohol use. Tobacco use. Drug use. Emotional well-being. Home and relationship  well-being. Sexual activity. Eating habits. History of falls. Memory and ability to understand (cognition). Work and work Statistician. Reproductive health. Screening  You may have the following tests or measurements: Height, weight, and BMI. Blood pressure. Lipid and cholesterol levels. These may be checked every 5 years, or more frequently if you are over 74 years old. Skin check. Lung cancer screening. You may have this screening every year starting at age 35 if you have a 30-pack-year history of smoking and currently smoke or have quit within the past 15 years. Fecal occult blood test (FOBT) of the stool. You may have this test every year starting at age 87. Flexible sigmoidoscopy or colonoscopy. You may have a sigmoidoscopy every 5 years or a colonoscopy every 10 years starting at age 81. Hepatitis C blood test. Hepatitis B blood test. Sexually transmitted disease (STD) testing. Diabetes screening. This is done by checking your blood sugar (glucose) after you have not eaten for a while (fasting). You may have this done every 1-3 years. Bone density scan. This is done to screen for osteoporosis. You may have this done starting at age 77. Mammogram. This may be done every 1-2 years. Talk to your health care provider about how often you should have regular mammograms. Talk with your health care provider about your test results, treatment options, and if necessary, the need for more tests. Vaccines  Your health care provider may recommend certain vaccines, such as: Influenza vaccine. This is recommended every year. Tetanus, diphtheria, and acellular pertussis (Tdap, Td)  vaccine. You may need a Td booster every 10 years. Zoster vaccine. You may need this after age 16. Pneumococcal 13-valent conjugate (PCV13) vaccine. One dose is recommended after age 38. Pneumococcal polysaccharide (PPSV23) vaccine. One dose is recommended after age 63. Talk to your health care provider about which  screenings and vaccines you need and how often you need them. This information is not intended to replace advice given to you by your health care provider. Make sure you discuss any questions you have with your health care provider. Document Released: 06/23/2015 Document Revised: 02/14/2016 Document Reviewed: 03/28/2015 Elsevier Interactive Patient Education  2017 [place] Prevention in the Home Falls can cause injuries. They can happen to people of all ages. There are many things you can do to make your home safe and to help prevent falls. What can I do on the outside of my home? Regularly fix the edges of walkways and driveways and fix any cracks. Remove anything that might make you trip as you walk through a door, such as a raised step or threshold. Trim any bushes or trees on the path to your home. Use bright outdoor lighting. Clear any walking paths of anything that might make someone trip, such as rocks or tools. Regularly check to see if handrails are loose or broken. Make sure that both sides of any steps have handrails. Any raised decks and porches should have guardrails on the edges. Have any leaves, snow, or ice cleared regularly. Use sand or salt on walking paths during winter. Clean up any spills in your garage right away. This includes oil or grease spills. What can I do in the bathroom? Use night lights. Install grab bars by the toilet and in the tub and shower. Do not use towel bars as grab bars. Use non-skid mats or decals in the tub or shower. If you need to sit down in the shower, use a plastic, non-slip stool. Keep the floor dry. Clean up any water that spills on the floor as soon as it happens. Remove soap buildup in the tub or shower regularly. Attach bath mats securely with double-sided non-slip rug tape. Do not have throw rugs and other things on the floor that can make you trip. What can I do in the bedroom? Use night lights. Make sure that you have a  light by your bed that is easy to reach. Do not use any sheets or blankets that are too big for your bed. They should not hang down onto the floor. Have a firm chair that has side arms. You can use this for support while you get dressed. Do not have throw rugs and other things on the floor that can make you trip. What can I do in the kitchen? Clean up any spills right away. Avoid walking on wet floors. Keep items that you use a lot in easy-to-reach places. If you need to reach something above you, use a strong step stool that has a grab bar. Keep electrical cords out of the way. Do not use floor polish or wax that makes floors slippery. If you must use wax, use non-skid floor wax. Do not have throw rugs and other things on the floor that can make you trip. What can I do with my stairs? Do not leave any items on the stairs. Make sure that there are handrails on both sides of the stairs and use them. Fix handrails that are broken or loose. Make sure that handrails are as long  as the stairways. Check any carpeting to make sure that it is firmly attached to the stairs. Fix any carpet that is loose or worn. Avoid having throw rugs at the top or bottom of the stairs. If you do have throw rugs, attach them to the floor with carpet tape. Make sure that you have a light switch at the top of the stairs and the bottom of the stairs. If you do not have them, ask someone to add them for you. What else can I do to help prevent falls? Wear shoes that: Do not have high heels. Have rubber bottoms. Are comfortable and fit you well. Are closed at the toe. Do not wear sandals. If you use a stepladder: Make sure that it is fully opened. Do not climb a closed stepladder. Make sure that both sides of the stepladder are locked into place. Ask someone to hold it for you, if possible. Clearly mark and make sure that you can see: Any grab bars or handrails. First and last steps. Where the edge of each step  is. Use tools that help you move around (mobility aids) if they are needed. These include: Canes. Walkers. Scooters. Crutches. Turn on the lights when you go into a dark area. Replace any light bulbs as soon as they burn out. Set up your furniture so you have a clear path. Avoid moving your furniture around. If any of your floors are uneven, fix them. If there are any pets around you, be aware of where they are. Review your medicines with your doctor. Some medicines can make you feel dizzy. This can increase your chance of falling. Ask your doctor what other things that you can do to help prevent falls. This information is not intended to replace advice given to you by your health care provider. Make sure you discuss any questions you have with your health care provider. Document Released: 03/23/2009 Document Revised: 11/02/2015 Document Reviewed: 07/01/2014 Elsevier Interactive Patient Education  2017 [employer].

## 2022-08-22 ENCOUNTER — Other Ambulatory Visit: Payer: Self-pay | Admitting: Family Medicine

## 2022-08-22 ENCOUNTER — Encounter: Payer: Self-pay | Admitting: Family Medicine

## 2022-08-22 ENCOUNTER — Ambulatory Visit (INDEPENDENT_AMBULATORY_CARE_PROVIDER_SITE_OTHER): Payer: 59 | Admitting: Family Medicine

## 2022-08-22 VITALS — BP 114/69 | HR 92 | Temp 96.1°F | Ht 69.0 in | Wt 177.4 lb

## 2022-08-22 DIAGNOSIS — E1159 Type 2 diabetes mellitus with other circulatory complications: Secondary | ICD-10-CM | POA: Diagnosis not present

## 2022-08-22 DIAGNOSIS — B370 Candidal stomatitis: Secondary | ICD-10-CM

## 2022-08-22 DIAGNOSIS — I10 Essential (primary) hypertension: Secondary | ICD-10-CM | POA: Diagnosis not present

## 2022-08-22 DIAGNOSIS — E1169 Type 2 diabetes mellitus with other specified complication: Secondary | ICD-10-CM | POA: Diagnosis not present

## 2022-08-22 DIAGNOSIS — N958 Other specified menopausal and perimenopausal disorders: Secondary | ICD-10-CM | POA: Diagnosis not present

## 2022-08-22 DIAGNOSIS — E11649 Type 2 diabetes mellitus with hypoglycemia without coma: Secondary | ICD-10-CM

## 2022-08-22 DIAGNOSIS — Z79899 Other long term (current) drug therapy: Secondary | ICD-10-CM

## 2022-08-22 DIAGNOSIS — R0602 Shortness of breath: Secondary | ICD-10-CM

## 2022-08-22 DIAGNOSIS — E785 Hyperlipidemia, unspecified: Secondary | ICD-10-CM

## 2022-08-22 DIAGNOSIS — E1142 Type 2 diabetes mellitus with diabetic polyneuropathy: Secondary | ICD-10-CM

## 2022-08-22 DIAGNOSIS — J309 Allergic rhinitis, unspecified: Secondary | ICD-10-CM

## 2022-08-22 DIAGNOSIS — I152 Hypertension secondary to endocrine disorders: Secondary | ICD-10-CM | POA: Diagnosis not present

## 2022-08-22 DIAGNOSIS — E782 Mixed hyperlipidemia: Secondary | ICD-10-CM | POA: Diagnosis not present

## 2022-08-22 LAB — BAYER DCA HB A1C WAIVED: HB A1C (BAYER DCA - WAIVED): 7 % — ABNORMAL HIGH (ref 4.8–5.6)

## 2022-08-22 MED ORDER — MONTELUKAST SODIUM 10 MG PO TABS: ORAL_TABLET | ORAL | 1 refills | Status: DC

## 2022-08-22 MED ORDER — LISINOPRIL 5 MG PO TABS: 5.0000 mg | ORAL_TABLET | Freq: Every day | ORAL | 1 refills | Status: DC

## 2022-08-22 MED ORDER — MIRABEGRON ER 50 MG PO TB24: 50.0000 mg | ORAL_TABLET | Freq: Every day | ORAL | 1 refills | Status: DC

## 2022-08-22 MED ORDER — GLIPIZIDE ER 5 MG PO TB24: 5.0000 mg | ORAL_TABLET | Freq: Two times a day (BID) | ORAL | 1 refills | Status: DC

## 2022-08-22 MED ORDER — NYSTATIN 100000 UNIT/ML MT SUSP: 5.0000 mL | Freq: Four times a day (QID) | OROMUCOSAL | 0 refills | Status: AC

## 2022-08-22 NOTE — Telephone Encounter (Signed)
Last office visit 08/22/22 Last refill 02/06/22, #90 5 refills

## 2022-08-22 NOTE — Progress Notes (Signed)
Subjective:  [name], female    DOB: December 14, 1955, 67 y.o.   MRN: [id number]  Patient Care Team: Baruch Gouty, FNP as PCP - General (Family Medicine) Suella Broad, MD as Consulting Physician (Physical Medicine and Rehabilitation) Norma Fredrickson, MD as Consulting Physician (Psychiatry) Phillips Odor, MD as Consulting Physician (Neurology) Lavera Guise, [facility] (Pharmacist) Warren Danes, PA-C as Physician Assistant (Dermatology) Madelin Headings, DO (Optometry)   Chief Complaint:  Diabetes (3 month follow up ) and Shortness of Breath (Patient states that she has episodes of gasping for air.  Would like new pulmonology since the one she has will not address it. )   HPI: Cynthia Deleon is a 67 y.o. female presenting on 08/22/2022 for Diabetes (3 month follow up ) and Shortness of Breath (Patient states that she has episodes of gasping for air.  Would like new pulmonology since the one she has will not address it. )   1. Type 2 diabetes mellitus with other specified complication, without long-term current use of insulin ([place]) Libre device downloaded and reviewed in office, pt in target 68% of the time. She was started on Mounjaro this week. She denies any hyper- or hypoglycemic symptoms.   2. Genitourinary syndrome of menopause Has been on premarin and Myrbetriq, tolerating well. Taking on a daily basis. Denies headaches, chest pain, leg swelling, palpitations, weakness, or dizziness. She does have episodes of shortness of breath and has seen the pulmonologist for this. States no changes were made to her regimen when she saw the pulmonologist. Would like referral to see new pulmonologist. States she will just have episodes where she feels like she has to gasp for air, no other associated symptoms. Only lasts for a few seconds.   3. Chronic allergic rhinitis Has been using Flonase and is taking Singulair on a daily basis. Reports symptoms are well controlled.    4. Hypertension associated with diabetes ([place]) Compliant with medications and tolerating well. No chest pain, leg swelling, weakness, confusion, visual changes, dizziness, or syncope.   5. Hyperlipidemia associated with type 2 diabetes mellitus ([place]) On Zetia and is supposed to be on statin therapy. States she is not taking statin therapy. She does try to watch her diet. Active on a daily basis.    Relevant past medical, surgical, family, and social history reviewed and updated as indicated.  Allergies and medications reviewed and updated. Data reviewed: Chart in Epic.   Past Medical History:  Diagnosis Date   Aortic atherosclerosis ([place]) 10/31/2021   Arthritis    hands and knees   Bipolar 1 disorder (HCC)    Borderline diabetic    Chronic back pain    Chronic neck pain    Complication of anesthesia    high anxiety-does not want to be alone   COPD (chronic obstructive pulmonary disease) ([place])    Current use of estrogen therapy 01/12/2014   Depression    Diabetes mellitus without complication (HCC)    Diplopia    Family history of adverse reaction to anesthesia    sister "gas in lungs"   GERD (gastroesophageal reflux disease)    Headache    Hepatic steatosis 10/31/2021   Hot flashes 12/15/2013   Hypertension    IBS (irritable bowel syndrome)    Incontinence    Kidney stone    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[place])    Osteopenia 02/06/2022   Panic attacks    Peptic ulcer    Peripheral neuropathy  Rosacea    Shingles    Sleep apnea    uses a cpap-with oxygen    Past Surgical History:  Procedure Laterality Date   BIOPSY  04/27/2020   Procedure: BIOPSY;  Surgeon: Ronald Lobo, MD;  Location: [facility];  Service: Endoscopy;;   BUNIONECTOMY WITH HAMMERTOE RECONSTRUCTION Right 12/10/2012   Procedure: RIGHT FIRST METATARSAL CHEVRON BUNION CORRECTION,  2 AND 3 HAMMERTOE CORRECTION , RIGHT 3 AND 4 TOE NAIL EXCISION ;  Surgeon: Wylene Simmer, MD;  Location: [facility];  Service: Orthopedics;  Laterality: Right;   COLONOSCOPY WITH PROPOFOL N/A 04/27/2020   Procedure: COLONOSCOPY WITH PROPOFOL;  Surgeon: Ronald Lobo, MD;  Location: [facility];  Service: Endoscopy;  Laterality: N/A;   FOOT ARTHRODESIS  2000   both feet   LIPOSUCTION  03/2018   RECTAL SURGERY     TONSILLECTOMY     TOTAL ABDOMINAL HYSTERECTOMY      Social History   Socioeconomic History   Marital status: Divorced    Spouse name: Not on file   Number of children: 5   Years of education: 9th   Highest education level: Not on file  Occupational History   Occupation: Disabled  Tobacco Use   Smoking status: Former    Packs/day: 1.00    Years: 42.00    Additional pack years: 0.00    Total pack years: 42.00    Types: Cigarettes    Quit date: 06/10/2017    Years since quitting: 5.2   Smokeless tobacco: Never  Vaping Use   Vaping Use: Never used  Substance and Sexual Activity   Alcohol use: No    Alcohol/week: 0.0 standard drinks of alcohol   Drug use: No   Sexual activity: Never    Birth control/protection: Surgical    Comment: occ cigarette  Other Topics Concern   Not on file  Social History Narrative   Lives at home with home with her son (has five children).   Right-handed.   1 cup caffeine per day.   Social Determinants of Health   Financial Resource Strain: Low Risk  (08/21/2022)   Overall Financial Resource Strain (CARDIA)    Difficulty of Paying Living Expenses: Not hard at all  Food Insecurity: No Food Insecurity (08/21/2022)   Hunger Vital Sign    Worried About Running Out of Food in the Last Year: Never true    Ran Out of Food in the Last Year: Never true  Transportation Needs: No Transportation Needs (08/21/2022)   PRAPARE - Hydrologist (Medical): No    Lack of Transportation (Non-Medical): No  Physical Activity: Inactive (08/21/2022)   Exercise Vital Sign    Days of Exercise per Week: 0 days    Minutes of  Exercise per Session: 0 min  Stress: No Stress Concern Present (08/21/2022)   [place]    Feeling of Stress : Not at all  Social Connections: Moderately Integrated (08/21/2022)   Social Connection and Isolation Panel [NHANES]    Frequency of Communication with Friends and Family: More than three times a week    Frequency of Social Gatherings with Friends and Family: More than three times a week    Attends Religious Services: More than 4 times per year    Active Member of [employer] or Organizations: Yes    Attends Music therapist: More than 4 times per year    Marital Status: Divorced  Intimate Partner Violence: Not At Risk (08/21/2022)   Humiliation, Afraid, Rape, and Kick questionnaire    Fear of Current or Ex-Partner: No    Emotionally Abused: No    Physically Abused: No    Sexually Abused: No    Outpatient Encounter Medications as of 08/22/2022  Medication Sig   albuterol (VENTOLIN HFA) 108 (90 Base) MCG/ACT inhaler Inhale 2 puffs into the lungs every 6 (six) hours as needed.   Artificial Saliva (BIOTENE DRY MOUTH MOISTURIZING) SOLN Use as directed 5 mLs in the mouth or throat in the morning and at bedtime.   aspirin EC 81 MG tablet Take 81 mg by mouth daily. Swallow whole.   cholecalciferol (VITAMIN D) 25 MCG tablet Take 1 tablet (1,000 Units total) by mouth daily.   Continuous Blood Gluc Sensor (FREESTYLE LIBRE 2 SENSOR) MISC Apply to arm every 14 days to check blood sugar continuously. DX E11.65   diazepam (VALIUM) 5 MG tablet Take 5 mg by mouth 2 (two) times daily.   DULoxetine (CYMBALTA) 60 MG capsule TAKE (1) CAPSULE BY MOUTH EVERY DAY.   estrogens, conjugated, (PREMARIN) 0.3 MG tablet TAKE (1) TABLET BY MOUTH ONCE DAILY.   ezetimibe (ZETIA) 10 MG tablet TAKE (1) TABLET BY MOUTH ONCE DAILY.   fluconazole (DIFLUCAN) 150 MG tablet 1 po q week x 4 weeks   fluorometholone (FML) 0.1 % ophthalmic suspension  SMARTSIG:In Eye(s)   fluticasone (FLONASE) 50 MCG/ACT nasal spray USE 2 SPRAYS IN EACH NOSTRIL ONCE DAILY. (Patient taking differently: Place 2 sprays into both nostrils daily.)   insulin lispro (HUMALOG KWIKPEN) 100 UNIT/ML KwikPen Use 5-10 units 2 times daily with breakfast and dinner when blood sugar is >200 on steroids   Insulin Pen Needle (PIP PEN NEEDLES 32G X 4MM) 32G X 4 MM MISC Use to inject insulin 2 times daily as needed. DX E11.65   lamoTRIgine (LAMICTAL) 200 MG tablet Take 200 mg by mouth at bedtime. For mood disorder.   lansoprazole (PREVACID) 30 MG capsule Take 1 capsule (30 mg total) by mouth daily in the afternoon.   levocetirizine (XYZAL) 5 MG tablet Take 1 tablet (5 mg total) by mouth every evening.   meclizine (ANTIVERT) 25 MG tablet Take 2 tablets (50 mg total) by mouth daily as needed for dizziness.   nystatin (MYCOSTATIN) 100000 UNIT/ML suspension Take 5 mLs (500,000 Units total) by mouth 4 (four) times daily for 7 days.   OneTouch Delica Lancets 99991111 MISC Use to test blood sugar twice daily. DX: E11.9   oxyCODONE (ROXICODONE) 15 MG immediate release tablet Take 1 tablet (15 mg total) by mouth every 6 (six) hours as needed for pain.   pregabalin (LYRICA) 150 MG capsule Take 1 capsule (150 mg total) by mouth 3 (three) times daily.   promethazine (PHENERGAN) 12.5 MG tablet Take 1 tablet (12.5 mg total) by mouth every 8 (eight) hours as needed for nausea or vomiting.   tirzepatide (MOUNJARO) 7.5 MG/0.5ML Pen Inject 7.5 mg into the skin once a week. DX: E11.65   umeclidinium-vilanterol (ANORO ELLIPTA) 62.5-25 MCG/ACT AEPB Inhale 1 puff into the lungs daily.   valACYclovir (VALTREX) 500 MG tablet TAKE (1) TABLET BY MOUTH ONCE DAILY AS NEEDED FOR SHINGLES FLARE UP.   vitamin B-12 1000 MCG tablet Take 1 tablet (1,000 mcg total) by mouth daily.   [DISCONTINUED] glipiZIDE (GLUCOTROL XL) 5 MG 24 hr tablet Take 1 tablet (5 mg total) by mouth 2 (two) times daily with a meal.    [DISCONTINUED] glucose  blood (CONTOUR NEXT TEST) test strip Test BS 4 times daily Dx E11.9   [DISCONTINUED] lisinopril (ZESTRIL) 5 MG tablet Take 1 tablet (5 mg total) by mouth daily.   [DISCONTINUED] mirabegron ER (MYRBETRIQ) 50 MG TB24 tablet Take 1 tablet (50 mg total) by mouth daily.   [DISCONTINUED] montelukast (SINGULAIR) 10 MG tablet TAKE (1) TABLET BY MOUTH AT BEDTIME.   atorvastatin (LIPITOR) 80 MG tablet Take 1 tablet (80 mg total) by mouth daily. (Patient not taking: Reported on 08/22/2022)   glipiZIDE (GLUCOTROL XL) 5 MG 24 hr tablet Take 1 tablet (5 mg total) by mouth 2 (two) times daily with a meal.   lisinopril (ZESTRIL) 5 MG tablet Take 1 tablet (5 mg total) by mouth daily.   mirabegron ER (MYRBETRIQ) 50 MG TB24 tablet Take 1 tablet (50 mg total) by mouth daily.   montelukast (SINGULAIR) 10 MG tablet TAKE (1) TABLET BY MOUTH AT BEDTIME.   No facility-administered encounter medications on file as of 08/22/2022.    Allergies  Allergen Reactions   Divalproex Sodium Other (See Comments)    Hallucinations  Other reaction(s): Confusion   Other Anaphylaxis and Other (See Comments)   Pseudoeph-Hydrocodone-Gg Nausea Only   Sulfa Antibiotics Anaphylaxis, Nausea Only, Other (See Comments) and Swelling    Swelling of tongue.   Valproic Acid Other (See Comments)   Varenicline Other (See Comments)    Altered mental status-Per patient was put on allergy list by Dr Lorriane Shire bu patient staes she is not allergic to this and is currently taking it.   Varenicline Tartrate Other (See Comments)    Other reaction(s): Confusion   Codeine Nausea And Vomiting and Other (See Comments)   Hydrocodone Nausea And Vomiting and Other (See Comments)   Ambien [Zolpidem] Other (See Comments)    Causes sleep walking   Clavulanic Acid Diarrhea   Elemental Sulfur Other (See Comments)    Swelling of tongue   Hydrocod Poli-Chlorphe Poli Er Other (See Comments)    Other reaction(s): Skin itches    Macrobid  [Nitrofurantoin] Nausea And Vomiting    Dizziness, nausea, vomiting   Paxil [Paroxetine Hcl] Other (See Comments)    Causes ringing in the ears.    Prednisone Other (See Comments)    Other reaction(s): Unknown    Amoxicillin Rash    Has patient had a PCN reaction causing immediate rash, facial/tongue/throat swelling, SOB or lightheadedness with hypotension: No Has patient had a PCN reaction causing severe rash involving mucus membranes or skin necrosis: No Has patient had a PCN reaction that required hospitalization: No Has patient had a PCN reaction occurring within the last 10 years: Yes If all of the above answers are "NO", then may proceed with Cephalosporin use.    [place] [Dapagliflozin]     RECURRENT YEAST/UTI   Metformin And Related Diarrhea   Tape Rash    Review of Systems  Constitutional:  Negative for activity change, appetite change, chills, diaphoresis, fatigue, fever and unexpected weight change.  HENT: Negative.    Eyes: Negative.  Negative for photophobia and visual disturbance.  Respiratory:  Positive for shortness of breath. Negative for cough, choking, chest tightness, wheezing and stridor.   Cardiovascular:  Negative for chest pain, palpitations and leg swelling.  Gastrointestinal:  Negative for abdominal pain, blood in stool, constipation, diarrhea, nausea and vomiting.  Endocrine: Negative.  Negative for polydipsia, polyphagia and polyuria.  Genitourinary:  Negative for decreased urine volume, difficulty urinating, dysuria, frequency and urgency.  Musculoskeletal:  Positive for  arthralgias and back pain. Negative for myalgias.  Skin: Negative.   Allergic/Immunologic: Negative.   Neurological:  Negative for dizziness, tremors, seizures, syncope, facial asymmetry, speech difficulty, weakness, light-headedness, numbness and headaches.  Hematological: Negative.   Psychiatric/Behavioral:  Negative for confusion, hallucinations, sleep disturbance and suicidal ideas.    All other systems reviewed and are negative.       Objective:  BP 114/69   Pulse 92   Temp (!) 96.1 F (35.6 C) (Temporal)   Ht '5\' 9"'$  (1.753 m)   Wt 177 lb 6.4 oz (80.5 kg)   SpO2 92%   BMI 26.20 kg/m    Wt Readings from Last 3 Encounters:  08/22/22 177 lb 6.4 oz (80.5 kg)  08/21/22 180 lb (81.6 kg)  05/22/22 176 lb 3.2 oz (79.9 kg)    Physical Exam Vitals and nursing note reviewed.  Constitutional:      General: She is not in acute distress.    Appearance: Normal appearance. She is well-developed, well-groomed and overweight. She is not ill-appearing, toxic-appearing or diaphoretic.  HENT:     Head: Normocephalic and atraumatic.     Jaw: There is normal jaw occlusion.     Right Ear: Hearing normal.     Left Ear: Hearing normal.     Nose: Nose normal.     Mouth/Throat:     Lips: Pink.     Mouth: Mucous membranes are moist.     Pharynx: Oropharynx is clear. Uvula midline.     Comments: Tongue: white discoloration to tongue Eyes:     General: Lids are normal.     Extraocular Movements: Extraocular movements intact.     Conjunctiva/sclera: Conjunctivae normal.     Pupils: Pupils are equal, round, and reactive to light.  Neck:     Thyroid: No thyroid mass, thyromegaly or thyroid tenderness.     Vascular: No carotid bruit or JVD.     Trachea: Trachea and phonation normal.  Cardiovascular:     Rate and Rhythm: Normal rate and regular rhythm.     Chest Wall: PMI is not displaced.     Pulses: Normal pulses.     Heart sounds: Normal heart sounds. No murmur heard.    No friction rub. No gallop.  Pulmonary:     Effort: Pulmonary effort is normal. No respiratory distress.     Breath sounds: Normal breath sounds. No stridor. No wheezing, rhonchi or rales.  Chest:     Chest wall: No tenderness.  Abdominal:     General: Bowel sounds are normal. There is no distension or abdominal bruit.     Palpations: Abdomen is soft. There is no hepatomegaly or splenomegaly.      Tenderness: There is no abdominal tenderness. There is no right CVA tenderness or left CVA tenderness.     Hernia: No hernia is present.  Musculoskeletal:        General: Normal range of motion.     Cervical back: Normal range of motion and neck supple.     Right lower leg: No edema.     Left lower leg: No edema.  Lymphadenopathy:     Cervical: No cervical adenopathy.  Skin:    General: Skin is warm and dry.     Capillary Refill: Capillary refill takes less than 2 seconds.     Coloration: Skin is not cyanotic, jaundiced or pale.     Findings: No rash.  Neurological:     General: No focal deficit present.  Mental Status: She is alert and oriented to person, place, and time.     Sensory: Sensation is intact.     Motor: Motor function is intact.     Coordination: Coordination is intact.     Gait: Gait is intact.     Deep Tendon Reflexes: Reflexes are normal and symmetric.  Psychiatric:        Attention and Perception: Attention and perception normal.        Mood and Affect: Mood and affect normal.        Speech: Speech normal.        Behavior: Behavior normal. Behavior is cooperative.        Thought Content: Thought content normal.        Cognition and Memory: Cognition and memory normal.        Judgment: Judgment normal.     Results for orders placed or performed in visit on 05/22/22  Lipid panel  Result Value Ref Range   Cholesterol, Total 150 100 - 199 mg/dL   Triglycerides 156 (H) 0 - 149 mg/dL   HDL 43 >39 mg/dL   VLDL Cholesterol Cal 27 5 - 40 mg/dL   LDL Chol Calc (NIH) 80 0 - 99 mg/dL   Chol/HDL Ratio 3.5 0.0 - 4.4 ratio  CBC with Differential/Platelet  Result Value Ref Range   WBC 9.0 3.4 - 10.8 x10E3/uL   RBC 4.70 3.77 - 5.28 x10E6/uL   Hemoglobin 15.1 11.1 - 15.9 g/dL   Hematocrit 44.1 34.0 - 46.6 %   MCV 94 79 - 97 fL   MCH 32.1 26.6 - 33.0 pg   MCHC 34.2 31.5 - 35.7 g/dL   RDW 12.7 11.7 - 15.4 %   Platelets 212 150 - 450 x10E3/uL   Neutrophils 64 Not  Estab. %   Lymphs 26 Not Estab. %   Monocytes 7 Not Estab. %   Eos 2 Not Estab. %   Basos 1 Not Estab. %   Neutrophils Absolute 5.7 1.4 - 7.0 x10E3/uL   Lymphocytes Absolute 2.4 0.7 - 3.1 x10E3/uL   Monocytes Absolute 0.6 0.1 - 0.9 x10E3/uL   EOS (ABSOLUTE) 0.2 0.0 - 0.4 x10E3/uL   Basophils Absolute 0.1 0.0 - 0.2 x10E3/uL   Immature Granulocytes 0 Not Estab. %   Immature Grans (Abs) 0.0 0.0 - 0.1 x10E3/uL  CMP14+EGFR  Result Value Ref Range   Glucose 97 70 - 99 mg/dL   BUN 9 8 - 27 mg/dL   Creatinine, Ser 0.68 0.57 - 1.00 mg/dL   eGFR 96 >59 mL/min/1.73   BUN/Creatinine Ratio 13 12 - 28   Sodium 141 134 - 144 mmol/L   Potassium 4.3 3.5 - 5.2 mmol/L   Chloride 101 96 - 106 mmol/L   CO2 26 20 - 29 mmol/L   Calcium 9.7 8.7 - 10.3 mg/dL   Total Protein 6.3 6.0 - 8.5 g/dL   Albumin 4.4 3.9 - 4.9 g/dL   Globulin, Total 1.9 1.5 - 4.5 g/dL   Albumin/Globulin Ratio 2.3 (H) 1.2 - 2.2   Bilirubin Total 0.8 0.0 - 1.2 mg/dL   Alkaline Phosphatase 68 44 - 121 IU/L   AST 18 0 - 40 IU/L   ALT 13 0 - 32 IU/L  Bayer DCA Hb A1c Waived  Result Value Ref Range   HB A1C (BAYER DCA - WAIVED) 7.5 (H) 4.8 - 5.6 %      In target range 68% of the time.   Pertinent labs & imaging  results that were available during my care of the patient were reviewed by me and considered in my medical decision making.  Assessment & Plan:  Jayauna was seen today for diabetes and shortness of breath.  Diagnoses and all orders for this visit:  Type 2 diabetes mellitus with other specified complication, without long-term current use of insulin ([place]) CGM downloaded and reviewed. Report scanned to chart. Pt has been in target range 68% of the time. She is doing well on regimen. Will likely be able to simplify the regimen if A1C remains at goal. Would like to stop glipizide and Humalog if able.  -     CMP14+EGFR -     Lipid panel -     Bayer DCA Hb A1c Waived -     glipiZIDE (GLUCOTROL XL) 5 MG 24 hr tablet; Take 1  tablet (5 mg total) by mouth 2 (two) times daily with a meal. -     lisinopril (ZESTRIL) 5 MG tablet; Take 1 tablet (5 mg total) by mouth daily.  Genitourinary syndrome of menopause Doing well on below, will continue.  -     mirabegron ER (MYRBETRIQ) 50 MG TB24 tablet; Take 1 tablet (50 mg total) by mouth daily.  Chronic allergic rhinitis Continue Flonase and Singulair as this is working well for her.  -     montelukast (SINGULAIR) 10 MG tablet; TAKE (1) TABLET BY MOUTH AT BEDTIME.  Hypertension associated with diabetes ([place]) BP well controlled. Changes were not made in regimen today. Goal BP is 130/80. Pt aware to report any persistent high or low readings. DASH diet and exercise encouraged. Exercise at least 150 minutes per week and increase as tolerated. Goal BMI > 25. Stress management encouraged. Avoid nicotine and tobacco product use. Avoid excessive alcohol and NSAID's. Avoid more than 2000 mg of sodium daily. Medications as prescribed. Follow up as scheduled.  -     CMP14+EGFR -     lisinopril (ZESTRIL) 5 MG tablet; Take 1 tablet (5 mg total) by mouth daily.  Hyperlipidemia associated with type 2 diabetes mellitus ([place]) Diet encouraged - increase intake of fresh fruits and vegetables, increase intake of lean proteins. Bake, broil, or grill foods. Avoid fried, greasy, and fatty foods. Avoid fast foods. Increase intake of fiber-rich whole grains. Exercise encouraged - at least 150 minutes per week and advance as tolerated.  Goal BMI < 25. Continue medications as prescribed. Follow up in 3-6 months as discussed.  -     CMP14+EGFR -     Lipid panel  Shortness of breath Intermittent, was seen by pulmonology and feels her concerns were not addressed, would like referral to new pulmonology. Referral placed.  -     Ambulatory referral to Pulmonology  [place] Will treat with below. Pt aware to report new, worsening, or persistent symptoms.  -     nystatin (MYCOSTATIN) 100000 UNIT/ML  suspension; Take 5 mLs (500,000 Units total) by mouth 4 (four) times daily for 7 days.     Continue all other maintenance medications.  Follow up plan: Return in about 3 months (around 11/22/2022) for DM.   Continue healthy lifestyle choices, including diet (rich in fruits, vegetables, and lean proteins, and low in salt and simple carbohydrates) and exercise (at least 30 minutes of moderate physical activity daily).  Educational handout given for DM  The above assessment and management plan was discussed with the patient. The patient verbalized understanding of and has agreed to the management plan. Patient is aware  to call the clinic if they develop any new symptoms or if symptoms persist or worsen. Patient is aware when to return to the clinic for a follow-up visit. Patient educated on when it is appropriate to go to the emergency department.   Monia Pouch, FNP-C [facility] [phone] \

## 2022-08-22 NOTE — Patient Instructions (Signed)

## 2022-08-23 ENCOUNTER — Telehealth: Payer: Self-pay | Admitting: Pharmacist

## 2022-08-23 DIAGNOSIS — Z79899 Other long term (current) drug therapy: Secondary | ICD-10-CM

## 2022-08-23 DIAGNOSIS — Z79818 Long term (current) use of other agents affecting estrogen receptors and estrogen levels: Secondary | ICD-10-CM

## 2022-08-23 DIAGNOSIS — N952 Postmenopausal atrophic vaginitis: Secondary | ICD-10-CM

## 2022-08-23 DIAGNOSIS — E782 Mixed hyperlipidemia: Secondary | ICD-10-CM

## 2022-08-23 LAB — CMP14+EGFR
ALT: 11 IU/L (ref 0–32)
AST: 13 IU/L (ref 0–40)
Albumin/Globulin Ratio: 2.4 — ABNORMAL HIGH (ref 1.2–2.2)
Albumin: 4.4 g/dL (ref 3.9–4.9)
Alkaline Phosphatase: 83 IU/L (ref 44–121)
BUN/Creatinine Ratio: 16 (ref 12–28)
BUN: 14 mg/dL (ref 8–27)
Bilirubin Total: 0.4 mg/dL (ref 0.0–1.2)
CO2: 22 mmol/L (ref 20–29)
Calcium: 10.5 mg/dL — ABNORMAL HIGH (ref 8.7–10.3)
Chloride: 104 mmol/L (ref 96–106)
Creatinine, Ser: 0.87 mg/dL (ref 0.57–1.00)
Globulin, Total: 1.8 g/dL (ref 1.5–4.5)
Glucose: 171 mg/dL — ABNORMAL HIGH (ref 70–99)
Potassium: 4.9 mmol/L (ref 3.5–5.2)
Sodium: 143 mmol/L (ref 134–144)
Total Protein: 6.2 g/dL (ref 6.0–8.5)
eGFR: 73 mL/min/{1.73_m2} (ref >59)

## 2022-08-23 LAB — LIPID PANEL
Chol/HDL Ratio: 3.3 ratio (ref 0.0–4.4)
Cholesterol, Total: 143 mg/dL (ref 100–199)
HDL: 44 mg/dL (ref >39)
LDL Chol Calc (NIH): 63 mg/dL (ref 0–99)
Triglycerides: 225 mg/dL — ABNORMAL HIGH (ref 0–149)
VLDL Cholesterol Cal: 36 mg/dL (ref 5–40)

## 2022-08-23 MED ORDER — EZETIMIBE 10 MG PO TABS: ORAL_TABLET | ORAL | 5 refills | Status: DC

## 2022-08-23 NOTE — Telephone Encounter (Signed)
Adding zetia back to regimen Called patient to discuss Adding to pill packs Spoke with the pharmacy

## 2022-08-26 DIAGNOSIS — G4733 Obstructive sleep apnea (adult) (pediatric): Secondary | ICD-10-CM | POA: Diagnosis not present

## 2022-08-31 DIAGNOSIS — G4733 Obstructive sleep apnea (adult) (pediatric): Secondary | ICD-10-CM | POA: Diagnosis not present

## 2022-09-11 ENCOUNTER — Ambulatory Visit: Payer: 59 | Admitting: Family Medicine

## 2022-09-13 ENCOUNTER — Encounter: Payer: Self-pay | Admitting: Family Medicine

## 2022-09-14 DIAGNOSIS — L84 Corns and callosities: Secondary | ICD-10-CM | POA: Diagnosis not present

## 2022-09-14 DIAGNOSIS — E11649 Type 2 diabetes mellitus with hypoglycemia without coma: Secondary | ICD-10-CM | POA: Diagnosis not present

## 2022-09-19 ENCOUNTER — Other Ambulatory Visit: Payer: Self-pay | Admitting: Family Medicine

## 2022-09-19 DIAGNOSIS — E1142 Type 2 diabetes mellitus with diabetic polyneuropathy: Secondary | ICD-10-CM

## 2022-09-19 DIAGNOSIS — N952 Postmenopausal atrophic vaginitis: Secondary | ICD-10-CM

## 2022-09-19 DIAGNOSIS — Z79899 Other long term (current) drug therapy: Secondary | ICD-10-CM

## 2022-09-20 MED ORDER — ESTROGENS CONJUGATED 0.3 MG PO TABS
ORAL_TABLET | ORAL | 10 refills | Status: DC
Start: 2022-09-20 — End: 2023-07-28

## 2022-09-20 NOTE — Telephone Encounter (Signed)
Refills needed for dose packs

## 2022-09-20 NOTE — Addendum Note (Signed)
Addended by: Vanice Sarah D on: 09/20/2022 10:14 AM   Modules accepted: Orders

## 2022-10-01 DIAGNOSIS — G4733 Obstructive sleep apnea (adult) (pediatric): Secondary | ICD-10-CM | POA: Diagnosis not present

## 2022-10-08 ENCOUNTER — Ambulatory Visit: Payer: 59 | Admitting: Pulmonary Disease

## 2022-10-10 DIAGNOSIS — E1165 Type 2 diabetes mellitus with hyperglycemia: Secondary | ICD-10-CM | POA: Diagnosis not present

## 2022-10-13 ENCOUNTER — Other Ambulatory Visit: Payer: Self-pay | Admitting: Acute Care

## 2022-10-13 DIAGNOSIS — Z122 Encounter for screening for malignant neoplasm of respiratory organs: Secondary | ICD-10-CM

## 2022-10-13 DIAGNOSIS — Z87891 Personal history of nicotine dependence: Secondary | ICD-10-CM

## 2022-10-15 ENCOUNTER — Encounter: Payer: Self-pay | Admitting: Family Medicine

## 2022-10-15 ENCOUNTER — Ambulatory Visit (INDEPENDENT_AMBULATORY_CARE_PROVIDER_SITE_OTHER): Payer: 59 | Admitting: Family Medicine

## 2022-10-15 VITALS — BP 139/79 | HR 96 | Temp 95.1°F | Ht 69.0 in | Wt 171.8 lb

## 2022-10-15 DIAGNOSIS — Z7984 Long term (current) use of oral hypoglycemic drugs: Secondary | ICD-10-CM

## 2022-10-15 DIAGNOSIS — Z79899 Other long term (current) drug therapy: Secondary | ICD-10-CM

## 2022-10-15 DIAGNOSIS — E1142 Type 2 diabetes mellitus with diabetic polyneuropathy: Secondary | ICD-10-CM

## 2022-10-15 MED ORDER — PREGABALIN 150 MG PO CAPS
150.0000 mg | ORAL_CAPSULE | Freq: Three times a day (TID) | ORAL | 3 refills | Status: DC
Start: 2022-10-15 — End: 2023-01-08

## 2022-10-15 NOTE — Progress Notes (Signed)
Subjective:  [name], female    DOB: 12/09/1955, 67 y.o.   MRN: [id number]  Patient Care Team: Sonny Masters, FNP as PCP - General (Family Medicine) Sheran Luz, MD as Consulting Physician (Physical Medicine and Rehabilitation) Archer Asa, MD as Consulting Physician (Psychiatry) Beryle Beams, MD as Consulting Physician (Neurology) Danella Maiers, [facility] (Pharmacist) Glyn Ade, PA-C as Physician Assistant (Dermatology) Daisy Lazar, DO (Optometry)   Chief Complaint:  Medication Refill (Lyrica )   HPI: Cynthia Deleon is a 67 y.o. female presenting on 10/15/2022 for Medication Refill (Lyrica )   Pt presents today for Lyrica refill. She has diabetic polyneuropathy. States she has been out of medications for a few days and has experienced sharp, shooting, burning, and tingling pain to her bilateral lower legs and feet.   Medication Refill Associated symptoms include myalgias and numbness (tingling, shooting pain in legs and feet). Pertinent negatives include no abdominal pain, anorexia, arthralgias, change in bowel habit, chest pain, chills, congestion, coughing, diaphoresis, fatigue, fever, headaches, joint swelling, nausea, neck pain, rash, sore throat, swollen glands, urinary symptoms, vertigo, visual change, vomiting or weakness.    Relevant past medical, surgical, family, and social history reviewed and updated as indicated.  Allergies and medications reviewed and updated. Data reviewed: Chart in Epic.   Past Medical History:  Diagnosis Date   Aortic atherosclerosis (HCC) 10/31/2021   Arthritis    hands and knees   Bipolar 1 disorder (HCC)    Borderline diabetic    Chronic back pain    Chronic neck pain    Complication of anesthesia    high anxiety-does not want to be alone   COPD (chronic obstructive pulmonary disease) (HCC)    Current use of estrogen therapy 01/12/2014   Depression    Diabetes mellitus without complication  (HCC)    Diplopia    Family history of adverse reaction to anesthesia    sister "gas in lungs"   GERD (gastroesophageal reflux disease)    Headache    Hepatic steatosis 10/31/2021   Hot flashes 12/15/2013   Hypertension    IBS (irritable bowel syndrome)    Incontinence    Kidney stone    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HCC)    Osteopenia 02/06/2022   Panic attacks    Peptic ulcer    Peripheral neuropathy    Rosacea    Shingles    Sleep apnea    uses a cpap-with oxygen    Past Surgical History:  Procedure Laterality Date   BIOPSY  04/27/2020   Procedure: BIOPSY;  Surgeon: Bernette Redbird, MD;  Location: [facility];  Service: Endoscopy;;   BUNIONECTOMY WITH HAMMERTOE RECONSTRUCTION Right 12/10/2012   Procedure: RIGHT FIRST METATARSAL CHEVRON BUNION CORRECTION,  2 AND 3 HAMMERTOE CORRECTION , RIGHT 3 AND 4 TOE NAIL EXCISION ;  Surgeon: Toni Arthurs, MD;  Location: [facility];  Service: Orthopedics;  Laterality: Right;   COLONOSCOPY WITH PROPOFOL N/A 04/27/2020   Procedure: COLONOSCOPY WITH PROPOFOL;  Surgeon: Bernette Redbird, MD;  Location: [facility];  Service: Endoscopy;  Laterality: N/A;   FOOT ARTHRODESIS  2000   both feet   LIPOSUCTION  03/2018   RECTAL SURGERY     TONSILLECTOMY     TOTAL ABDOMINAL HYSTERECTOMY      Social History   Socioeconomic History   Marital status: Divorced    Spouse name: Not on file   Number of children: 5   Years of education:  9th   Highest education level: Not on file  Occupational History   Occupation: Disabled  Tobacco Use   Smoking status: Former    Packs/day: 1.00    Years: 42.00    Additional pack years: 0.00    Total pack years: 42.00    Types: Cigarettes    Quit date: 06/10/2017    Years since quitting: 5.3   Smokeless tobacco: Never  Vaping Use   Vaping Use: Never used  Substance and Sexual Activity   Alcohol use: No    Alcohol/week: 0.0 standard drinks of alcohol   Drug use: No   Sexual activity:  Never    Birth control/protection: Surgical    Comment: occ cigarette  Other Topics Concern   Not on file  Social History Narrative   Lives at home with home with her son (has five children).   Right-handed.   1 cup caffeine per day.   Social Determinants of Health   Financial Resource Strain: Low Risk  (08/21/2022)   Overall Financial Resource Strain (CARDIA)    Difficulty of Paying Living Expenses: Not hard at all  Food Insecurity: No Food Insecurity (08/21/2022)   Hunger Vital Sign    Worried About Running Out of Food in the Last Year: Never true    Ran Out of Food in the Last Year: Never true  Transportation Needs: No Transportation Needs (08/21/2022)   PRAPARE - Administrator, Civil Service (Medical): No    Lack of Transportation (Non-Medical): No  Physical Activity: Inactive (08/21/2022)   Exercise Vital Sign    Days of Exercise per Week: 0 days    Minutes of Exercise per Session: 0 min  Stress: No Stress Concern Present (08/21/2022)   [employer] of Occupational Health - Occupational Stress Questionnaire    Feeling of Stress : Not at all  Social Connections: Moderately Integrated (08/21/2022)   Social Connection and Isolation Panel [NHANES]    Frequency of Communication with Friends and Family: More than three times a week    Frequency of Social Gatherings with Friends and Family: More than three times a week    Attends Religious Services: More than 4 times per year    Active Member of [employer] or Organizations: Yes    Attends Engineer, structural: More than 4 times per year    Marital Status: Divorced  Intimate Partner Violence: Not At Risk (08/21/2022)   Humiliation, Afraid, Rape, and Kick questionnaire    Fear of Current or Ex-Partner: No    Emotionally Abused: No    Physically Abused: No    Sexually Abused: No    Outpatient Encounter Medications as of 10/15/2022  Medication Sig   albuterol (VENTOLIN HFA) 108 (90 Base) MCG/ACT inhaler Inhale 2  puffs into the lungs every 6 (six) hours as needed.   Artificial Saliva (BIOTENE DRY MOUTH MOISTURIZING) SOLN Use as directed 5 mLs in the mouth or throat in the morning and at bedtime.   aspirin EC 81 MG tablet Take 81 mg by mouth daily. Swallow whole.   atorvastatin (LIPITOR) 80 MG tablet Take 1 tablet (80 mg total) by mouth daily.   cholecalciferol (VITAMIN D) 25 MCG tablet Take 1 tablet (1,000 Units total) by mouth daily.   Continuous Blood Gluc Sensor (FREESTYLE LIBRE 2 SENSOR) MISC Apply to arm every 14 days to check blood sugar continuously. DX E11.65   diazepam (VALIUM) 5 MG tablet Take 5 mg by mouth 2 (two) times daily.  DULoxetine (CYMBALTA) 60 MG capsule TAKE (1) CAPSULE BY MOUTH EVERY DAY.   estrogens, conjugated, (PREMARIN) 0.3 MG tablet TAKE (1) TABLET BY MOUTH ONCE DAILY.   ezetimibe (ZETIA) 10 MG tablet TAKE (1) TABLET BY MOUTH ONCE DAILY.   fluorometholone (FML) 0.1 % ophthalmic suspension SMARTSIG:In Eye(s)   fluticasone (FLONASE) 50 MCG/ACT nasal spray USE 2 SPRAYS IN EACH NOSTRIL ONCE DAILY. (Patient taking differently: Place 2 sprays into both nostrils daily.)   glipiZIDE (GLUCOTROL XL) 5 MG 24 hr tablet Take 1 tablet (5 mg total) by mouth 2 (two) times daily with a meal.   insulin lispro (HUMALOG KWIKPEN) 100 UNIT/ML KwikPen Use 5-10 units 2 times daily with breakfast and dinner when blood sugar is >200 on steroids   Insulin Pen Needle (PIP PEN NEEDLES 32G X ) 32G X 4 MM MISC Use to inject insulin 2 times daily as needed. DX E11.65   lamoTRIgine (LAMICTAL) 200 MG tablet Take 200 mg by mouth at bedtime. For mood disorder.   lansoprazole (PREVACID) 30 MG capsule Take 1 capsule (30 mg total) by mouth daily in the afternoon.   levocetirizine (XYZAL) 5 MG tablet Take 1 tablet (5 mg total) by mouth every evening.   lisinopril (ZESTRIL) 5 MG tablet Take 1 tablet (5 mg total) by mouth daily.   meclizine (ANTIVERT) 25 MG tablet Take 2 tablets (50 mg total) by mouth daily as needed  for dizziness.   mirabegron ER (MYRBETRIQ) 50 MG TB24 tablet Take 1 tablet (50 mg total) by mouth daily.   montelukast (SINGULAIR) 10 MG tablet TAKE (1) TABLET BY MOUTH AT BEDTIME.   OneTouch Delica Lancets 30G MISC Use to test blood sugar twice daily. DX: E11.9   oxyCODONE (ROXICODONE) 15 MG immediate release tablet Take 1 tablet (15 mg total) by mouth every 6 (six) hours as needed for pain.   promethazine (PHENERGAN) 12.5 MG tablet Take 1 tablet (12.5 mg total) by mouth every 8 (eight) hours as needed for nausea or vomiting.   tirzepatide (MOUNJARO) 7.5 MG/0.5ML Pen Inject 7.5 mg into the skin once a week. DX: E11.65   umeclidinium-vilanterol (ANORO ELLIPTA) 62.5-25 MCG/ACT AEPB Inhale 1 puff into the lungs daily.   valACYclovir (VALTREX) 500 MG tablet TAKE (1) TABLET BY MOUTH ONCE DAILY AS NEEDED FOR SHINGLES FLARE UP.   vitamin B-12 1000 MCG tablet Take 1 tablet (1,000 mcg total) by mouth daily.   pregabalin (LYRICA) 150 MG capsule Take 1 capsule (150 mg total) by mouth 3 (three) times daily.   [DISCONTINUED] pregabalin (LYRICA) 150 MG capsule Take 1 capsule (150 mg total) by mouth 3 (three) times daily. (Patient not taking: Reported on 10/15/2022)   No facility-administered encounter medications on file as of 10/15/2022.    Allergies  Allergen Reactions   Divalproex Sodium Other (See Comments)    Hallucinations  Other reaction(s): Confusion   Other Anaphylaxis and Other (See Comments)   Pseudoeph-Hydrocodone-Gg Nausea Only   Sulfa Antibiotics Anaphylaxis, Nausea Only, Other (See Comments) and Swelling    Swelling of tongue.   Valproic Acid Other (See Comments)   Varenicline Other (See Comments)    Altered mental status-Per patient was put on allergy list by Dr Delbert Harness bu patient staes she is not allergic to this and is currently taking it.   Varenicline Tartrate Other (See Comments)    Other reaction(s): Confusion   Codeine Nausea And Vomiting and Other (See Comments)   Hydrocodone  Nausea And Vomiting and Other (See Comments)  Ambien [Zolpidem] Other (See Comments)    Causes sleep walking   Clavulanic Acid Diarrhea   Elemental Sulfur Other (See Comments)    Swelling of tongue   Hydrocod Poli-Chlorphe Poli Er Other (See Comments)    Other reaction(s): Skin itches    Macrobid [Nitrofurantoin] Nausea And Vomiting    Dizziness, nausea, vomiting   Paxil [Paroxetine Hcl] Other (See Comments)    Causes ringing in the ears.    Prednisone Other (See Comments)    Other reaction(s): Unknown    Amoxicillin Rash    Has patient had a PCN reaction causing immediate rash, facial/tongue/throat swelling, SOB or lightheadedness with hypotension: No Has patient had a PCN reaction causing severe rash involving mucus membranes or skin necrosis: No Has patient had a PCN reaction that required hospitalization: No Has patient had a PCN reaction occurring within the last 10 years: Yes If all of the above answers are "NO", then may proceed with Cephalosporin use.    [place] [Dapagliflozin]     RECURRENT YEAST/UTI   Metformin And Related Diarrhea   Tape Rash    Review of Systems  Constitutional:  Negative for activity change, appetite change, chills, diaphoresis, fatigue and fever.  HENT: Negative.  Negative for congestion and sore throat.   Eyes: Negative.   Respiratory:  Negative for cough, chest tightness and shortness of breath.   Cardiovascular:  Negative for chest pain, palpitations and leg swelling.  Gastrointestinal:  Negative for abdominal pain, anorexia, blood in stool, change in bowel habit, constipation, diarrhea, nausea and vomiting.  Endocrine: Negative.   Genitourinary:  Negative for dysuria, frequency and urgency.  Musculoskeletal:  Positive for myalgias. Negative for arthralgias, joint swelling and neck pain.  Skin: Negative.  Negative for rash.  Allergic/Immunologic: Negative.   Neurological:  Positive for numbness (tingling, shooting pain in legs and feet).  Negative for dizziness, vertigo, weakness and headaches.  Hematological: Negative.   Psychiatric/Behavioral:  Negative for confusion, hallucinations, sleep disturbance and suicidal ideas.   All other systems reviewed and are negative.       Objective:  BP 139/79   Pulse 96   Temp (!) 95.1 F (35.1 C) (Temporal)   Ht 5\' 9"  (1.753 m)   Wt 171 lb 12.8 oz (77.9 kg)   SpO2 96%   BMI 25.37 kg/m    Wt Readings from Last 3 Encounters:  10/15/22 171 lb 12.8 oz (77.9 kg)  08/22/22 177 lb 6.4 oz (80.5 kg)  08/21/22 180 lb (81.6 kg)    Physical Exam Vitals and nursing note reviewed.  Constitutional:      General: She is not in acute distress.    Appearance: Normal appearance. She is not ill-appearing, toxic-appearing or diaphoretic.  HENT:     Head: Normocephalic and atraumatic.  Eyes:     Conjunctiva/sclera: Conjunctivae normal.     Pupils: Pupils are equal, round, and reactive to light.  Cardiovascular:     Rate and Rhythm: Normal rate and regular rhythm.     Heart sounds: Normal heart sounds.  Pulmonary:     Effort: Pulmonary effort is normal.     Breath sounds: Normal breath sounds.  Musculoskeletal:     Right lower leg: No edema.     Left lower leg: No edema.  Skin:    General: Skin is warm and dry.     Capillary Refill: Capillary refill takes less than 2 seconds.  Neurological:     General: No focal deficit present.  Mental Status: She is alert and oriented to person, place, and time.  Psychiatric:        Mood and Affect: Mood normal.        Behavior: Behavior normal.        Thought Content: Thought content normal.        Judgment: Judgment normal.     Results for orders placed or performed in visit on 08/22/22  CMP14+EGFR  Result Value Ref Range   Glucose 171 (H) 70 - 99 mg/dL   BUN 14 8 - 27 mg/dL   Creatinine, Ser 1.61 0.57 - 1.00 mg/dL   eGFR 73 >09 UE/AVW/0.98   BUN/Creatinine Ratio 16 12 - 28   Sodium 143 134 - 144 mmol/L   Potassium 4.9 3.5 - 5.2  mmol/L   Chloride 104 96 - 106 mmol/L   CO2 22 20 - 29 mmol/L   Calcium 10.5 (H) 8.7 - 10.3 mg/dL   Total Protein 6.2 6.0 - 8.5 g/dL   Albumin 4.4 3.9 - 4.9 g/dL   Globulin, Total 1.8 1.5 - 4.5 g/dL   Albumin/Globulin Ratio 2.4 (H) 1.2 - 2.2   Bilirubin Total 0.4 0.0 - 1.2 mg/dL   Alkaline Phosphatase 83 44 - 121 IU/L   AST 13 0 - 40 IU/L   ALT 11 0 - 32 IU/L  Lipid panel  Result Value Ref Range   Cholesterol, Total 143 100 - 199 mg/dL   Triglycerides 119 (H) 0 - 149 mg/dL   HDL 44 >14 mg/dL   VLDL Cholesterol Cal 36 5 - 40 mg/dL   LDL Chol Calc (NIH) 63 0 - 99 mg/dL   Chol/HDL Ratio 3.3 0.0 - 4.4 ratio  Bayer DCA Hb A1c Waived  Result Value Ref Range   HB A1C (BAYER DCA - WAIVED) 7.0 (H) 4.8 - 5.6 %       Pertinent labs & imaging results that were available during my care of the patient were reviewed by me and considered in my medical decision making.  Assessment & Plan:  Shanieka was seen today for medication refill.  Diagnoses and all orders for this visit:  Polyneuropathy due to type 2 diabetes mellitus (HCC) Controlled substance agreement signed Has been out of Lyrica for several days and reports significant sharp, shooting pain. CSA updated today and medications refilled. Sedation precautions discussed in detail. Follow up in 3 months for reevaluation.  PDMP reviewed, no red flags present.  -     pregabalin (LYRICA) 150 MG capsule; Take 1 capsule (150 mg total) by mouth 3 (three) times daily. -     ToxASSURE Select 13 (MW), Urine     Continue all other maintenance medications.  Follow up plan: Return in about 3 months (around 01/15/2023).   Continue healthy lifestyle choices, including diet (rich in fruits, vegetables, and lean proteins, and low in salt and simple carbohydrates) and exercise (at least 30 minutes of moderate physical activity daily).   The above assessment and management plan was discussed with the patient. The patient verbalized understanding of  and has agreed to the management plan. Patient is aware to call the clinic if they develop any new symptoms or if symptoms persist or worsen. Patient is aware when to return to the clinic for a follow-up visit. Patient educated on when it is appropriate to go to the emergency department.   Kari Baars, FNP-C [facility] [phone]

## 2022-10-17 LAB — TOXASSURE SELECT 13 (MW), URINE

## 2022-10-22 ENCOUNTER — Encounter: Payer: Self-pay | Admitting: Family Medicine

## 2022-10-22 ENCOUNTER — Ambulatory Visit (INDEPENDENT_AMBULATORY_CARE_PROVIDER_SITE_OTHER): Payer: 59 | Admitting: Family Medicine

## 2022-10-22 VITALS — BP 111/72 | HR 105 | Temp 98.2°F | Ht 69.0 in | Wt 171.0 lb

## 2022-10-22 DIAGNOSIS — F3164 Bipolar disorder, current episode mixed, severe, with psychotic features: Secondary | ICD-10-CM | POA: Diagnosis not present

## 2022-10-22 DIAGNOSIS — E162 Hypoglycemia, unspecified: Secondary | ICD-10-CM | POA: Diagnosis not present

## 2022-10-22 DIAGNOSIS — H60393 Other infective otitis externa, bilateral: Secondary | ICD-10-CM | POA: Diagnosis not present

## 2022-10-22 DIAGNOSIS — R000 Tachycardia, unspecified: Secondary | ICD-10-CM | POA: Diagnosis not present

## 2022-10-22 MED ORDER — CIPROFLOXACIN-DEXAMETHASONE 0.3-0.1 % OT SUSP
4.0000 [drp] | Freq: Two times a day (BID) | OTIC | 0 refills | Status: DC
Start: 2022-10-22 — End: 2023-01-17

## 2022-10-22 NOTE — Progress Notes (Signed)
     Pharmacy Note 10/24/2022 Name:  Cynthia Deleon    MRN:  [id number]      DOB:  1955/12/04   Summary:   Diabetes: Controlled- A1C 7.5-->7% - current treatment: GLIPIZIDE, Mounjaro 7.5mg  SQ WEEKLY;  INTOLERANCES/PAST MEDS METFORMIN (DUE TO GI) DISCONTINUED FARXIGA DUE TO RECURRENT UTI/YEAST INFECTIONS DESPITE CONTROLLED SUGARS JANUVIA DISCONTINUED (due to trulicity) D/C'D OZEMPIC DUE TO GI SIDE EFFECTS (TOLERATED TRULICITY) Continue MOUNJARO 7.5MG  sq weekly Denies personal and family history of Medullary thyroid cancer (MTC) CONTINUE GLIPIZIDE -- will likely d/c at f/u Sliding scale humalog given due to recurrent steroid injections (has not used in 3 months) Current glucose readings: fasting glucose: up to 180, post prandial glucose: <200 MUCH IMPROVED Patient would like to continue libre 2 CGM Extensive education provided on FS libre 2 CGM system; pt able to demonstrated and teachback Libre 2 CGM report TIR 94%, 1% hypoglycemia Work on hypoglycemia-->encouraged patient to eat 3 steady healthy plate meals (~40-98 carbs  per meal per day) Denies hypoglycemic symptoms Discussed meal planning options and Plate method for healthy eating--Patient admits to not eating wisely, she is trying to work on this Avoid sugary drinks and desserts Incorporate balanced protein, non starchy veggies, 1 serving of carbohydrate with each meal Increase water intake Increase physical activity as able Current exercise: active job  Educated on current medications, patient on pill packaging with [employer], cont glipizide, LIBRE Recommended annual foot exam--prefers dr. Ulice Brilliant in [facility] Su Ley, PharmD, BCACP Clinical Pharmacist, [facility]

## 2022-10-22 NOTE — Progress Notes (Signed)
Acute Office Visit  Subjective:  [name], female    DOB: May 20, 1956, 67 y.o.   MRN: [id number]  Chief Complaint  Patient presents with   left ear pain, hearing loss   HPI Patient is in today for left ear pain. Reports that it comes and goes. She wears an airpod whenever she is out. She states that she already has pain medication so she has not taken anything extra for her ear. Pain 4/10. States that it is irritating and dull. She has been to UC before and they put peroxide in her ear and it helped, but that was several years ago. Endorses congestion and cough. She is using nasal spray  ROS As per HPI  Objective:  BP 111/72   Pulse (!) 105   Temp 98.2 F (36.8 C)   Ht 5\' 9"  (1.753 m)   Wt 171 lb (77.6 kg)   SpO2 93%   BMI 25.25 kg/m   Physical Exam Constitutional:      General: She is not in acute distress.    Appearance: Normal appearance. She is not ill-appearing, toxic-appearing or diaphoretic.  HENT:     Right Ear: There is impacted cerumen. Tympanic membrane is not injected, scarred, perforated, erythematous, retracted or bulging.     Left Ear: There is impacted cerumen. Tympanic membrane is not injected, scarred, perforated, erythematous, retracted or bulging.     Ears:     Comments: Bilateral ear canals erythematous with flaking skin  Cardiovascular:     Rate and Rhythm: Normal rate.     Pulses: Normal pulses.     Heart sounds: Normal heart sounds. No murmur heard.    No gallop.  Pulmonary:     Effort: Pulmonary effort is normal. No respiratory distress.     Breath sounds: Normal breath sounds. No stridor. No wheezing, rhonchi or rales.  Skin:    General: Skin is warm.     Capillary Refill: Capillary refill takes less than 2 seconds.  Neurological:     General: No focal deficit present.     Mental Status: She is alert and oriented to person, place, and time. Mental status is at baseline.     Motor: No weakness.  Psychiatric:        Mood  and Affect: Mood normal.        Behavior: Behavior normal.        Thought Content: Thought content normal.        Judgment: Judgment normal.       10/22/2022    1:11 PM 08/22/2022    2:50 PM 08/21/2022    1:29 PM  Depression screen PHQ 2/9  Decreased Interest 0 0 0  Down, Depressed, Hopeless 0 0 0  PHQ - 2 Score 0 0 0  Altered sleeping 3 0   Tired, decreased energy 0 0   Change in appetite 0 3   Feeling bad or failure about yourself  3 0   Trouble concentrating 0 1   Moving slowly or fidgety/restless 0 0   Suicidal thoughts 0 0   PHQ-9 Score 6 4   Difficult doing work/chores  Not difficult at all       10/22/2022    1:11 PM 02/06/2022   11:00 AM 12/07/2021    1:32 PM 09/13/2021   10:43 AM  GAD 7 : Generalized Anxiety Score  Nervous, Anxious, on Edge 3 2 1 1   Control/stop worrying 3 2 0 1  Worry too  much - different things 3 2 0 1  Trouble relaxing 3 0 0 1  Restless 3 2 0 0  Easily annoyed or irritable 0 2 0 0  Afraid - awful might happen 0 2 0 2  Total GAD 7 Score 15 12 1 6   Anxiety Difficulty Not difficult at all Not difficult at all Not difficult at all Not difficult at all   Assessment & Plan:  1. Other infective acute otitis externa of both ears Medication as below. Patient to follow up if symptoms doe not improve.  - ciprofloxacin-dexamethasone (CIPRODEX) OTIC suspension; Place 4 drops into both ears 2 (two) times daily.  Dispense: 7.5 mL; Refill: 0  2. Hypoglycemia During visit today patient became hypoglycemic with BG at 68 mg/dL with symptoms of shakiness. She has also had hypoglycemic events at home per her glucometer. Provided patient with apple juice and she recovered to 90 mg/dL. She is  3. Tachycardia Most likely related to hypoglycemia as above. Will continue to monitor at follow up visits.   4. Bipolar I disorder, most recent episode mixed, severe with psychotic features [facility]) Patient screened positive for anxiety and depression today. Denies SI. States  that she is following up appropriately and does not want to change therapy at this time.   The above assessment and management plan was discussed with the patient. The patient verbalized understanding of and has agreed to the management plan using shared-decision making. Patient is aware to call the clinic if they develop any new symptoms or if symptoms fail to improve or worsen. Patient is aware when to return to the clinic for a follow-up visit. Patient educated on when it is appropriate to go to the emergency department.   Return if symptoms worsen or fail to improve.  Neale Burly, DNP-FNP [facility] [address] [place], [place] [address] [phone]

## 2022-10-22 NOTE — Progress Notes (Signed)
3

## 2022-10-22 NOTE — Patient Instructions (Addendum)
Zyrtec and Xyzal - for allergies Debrox - for ear wax  Ear Wax removal Kit  Smart Swab  [phone] - [facility] Dr. Wyn Forster

## 2022-10-23 ENCOUNTER — Telehealth: Payer: Self-pay | Admitting: Family Medicine

## 2022-10-23 NOTE — Telephone Encounter (Signed)
Patient fell 4 times last night 5/14 - thinks it was a vertigo attack but her arms and legs collapsed. She did not hit her head when she fell. Was seen on 5/14 for left ear pain and issues hearing. Wants to speak to nurse. Please call back

## 2022-10-23 NOTE — Telephone Encounter (Signed)
139 is current BS reading  Pt did injure tailbone during fall. If not better in a few days she will call for an appt.  Pt denies hitting her head during falls. She did not seem disoriented when speaking with her.  Pt advised to rest in bed, keep taking Meclizine, hydrate more with water and keep snacks with her.  She does have a walker but it is in a different room. Advised that she needs to use this while walking. Pt does not have anyone to help her.  She does have a life alert but it is not working. States that she probably needs a new script since she switched from [facility] to [facility].  Spoke with Lynden Ang in home health and she states that the pt needs to call the life alert number off of their website.  [phone]  Reviewed number multiple times with pt. She is unable to write it down because she is having a "vertigo attack".  Pt states that she will call back when she is more alert.

## 2022-10-24 ENCOUNTER — Ambulatory Visit (INDEPENDENT_AMBULATORY_CARE_PROVIDER_SITE_OTHER): Payer: 59 | Admitting: Pharmacist

## 2022-10-24 DIAGNOSIS — Z7985 Long-term (current) use of injectable non-insulin antidiabetic drugs: Secondary | ICD-10-CM

## 2022-10-24 DIAGNOSIS — E11649 Type 2 diabetes mellitus with hypoglycemia without coma: Secondary | ICD-10-CM

## 2022-10-25 DIAGNOSIS — B370 Candidal stomatitis: Secondary | ICD-10-CM | POA: Diagnosis not present

## 2022-10-25 DIAGNOSIS — J441 Chronic obstructive pulmonary disease with (acute) exacerbation: Secondary | ICD-10-CM | POA: Diagnosis not present

## 2022-10-25 DIAGNOSIS — L03031 Cellulitis of right toe: Secondary | ICD-10-CM | POA: Diagnosis not present

## 2022-10-25 NOTE — Telephone Encounter (Signed)
I was able to give phone number for life alert to patient. She wrote it down and repeated back to me.

## 2022-10-28 ENCOUNTER — Ambulatory Visit (INDEPENDENT_AMBULATORY_CARE_PROVIDER_SITE_OTHER): Payer: 59 | Admitting: Adult Health

## 2022-10-28 ENCOUNTER — Encounter: Payer: Self-pay | Admitting: Adult Health

## 2022-10-28 VITALS — BP 118/72 | HR 112 | Ht 68.0 in | Wt 177.0 lb

## 2022-10-28 DIAGNOSIS — Z90710 Acquired absence of both cervix and uterus: Secondary | ICD-10-CM | POA: Diagnosis not present

## 2022-10-28 DIAGNOSIS — Z8619 Personal history of other infectious and parasitic diseases: Secondary | ICD-10-CM | POA: Insufficient documentation

## 2022-10-28 MED ORDER — VALACYCLOVIR HCL 1 G PO TABS: 1000.0000 mg | ORAL_TABLET | Freq: Every day | ORAL | 12 refills | Status: DC

## 2022-10-28 NOTE — Progress Notes (Signed)
  Subjective:     [name], female   DOB: 09-02-1955, 67 y.o.   MRN: [id number]  HPI Cynthia Deleon is a 67 year old white female, divorced, sp hysterectomy in requesting refill on valtrex, was prescribed at health dept. PCP is [facility]   Review of Systems  hx vesicles on left hip    Not currently having sex Reviewed past medical,surgical, social and family history. Reviewed medications and allergies.  Objective:   Physical Exam BP 118/72 (BP Location: Left Arm, Patient Position: Sitting, Cuff Size: Normal)   Pulse (!) 112   Ht 5\' 8"  (1.727 m)   Wt 177 lb (80.3 kg)   BMI 26.91 kg/m     Skin warm and dry. Lungs: clear to ausculation bilaterally. Cardiovascular: regular rate and rhythm.  Fall risk is high     10/28/2022    3:21 PM 10/22/2022    1:11 PM 08/22/2022    2:50 PM  Depression screen PHQ 2/9  Decreased Interest 3 0 0  Down, Depressed, Hopeless 3 0 0  PHQ - 2 Score 6 0 0  Altered sleeping 3 3 0  Tired, decreased energy 3 0 0  Change in appetite 3 0 3  Feeling bad or failure about yourself  3 3 0  Trouble concentrating 3 0 1  Moving slowly or fidgety/restless 3 0 0  Suicidal thoughts 0 0 0  PHQ-9 Score 24 6 4   Difficult doing work/chores   Not difficult at all   She is on meds     10/28/2022    3:22 PM 10/22/2022    1:11 PM 02/06/2022   11:00 AM 12/07/2021    1:32 PM  GAD 7 : Generalized Anxiety Score  Nervous, Anxious, on Edge 3 3 2 1   Control/stop worrying 3 3 2  0  Worry too much - different things 3 3 2  0  Trouble relaxing 3 3 0 0  Restless 3 3 2  0  Easily annoyed or irritable 2 0 2 0  Afraid - awful might happen 3 0 2 0  Total GAD 7 Score 20 15 12 1   Anxiety Difficulty  Not difficult at all Not difficult at all Not difficult at all      Upstream - 10/28/22 1525       Pregnancy Intention Screening   Does the patient want to become pregnant in the next year? N/A    Does the patient's partner want to become pregnant in the next year?  N/A    Would the patient like to discuss contraceptive options today? N/A      Contraception Wrap Up   Current Method --   hyst   End Method --   hyst   Contraception Counseling Provided No             Assessment:     1. History of shingles  2. S/P hysterectomy     Plan:    Refilled valtrex  Meds ordered this encounter  Medications   valACYclovir (VALTREX) 1000 MG tablet    Sig: Take 1 tablet (1,000 mg total) by mouth daily.    Dispense:  30 tablet    Refill:  12    Please deliver after 2 pm    Order Specific Question:   Supervising Provider    Answer:   Duane Lope H [[id number]]    Follow up in 1 year or sooner if needed

## 2022-10-30 ENCOUNTER — Ambulatory Visit: Payer: 59 | Admitting: Family Medicine

## 2022-10-31 ENCOUNTER — Ambulatory Visit: Payer: 59

## 2022-10-31 DIAGNOSIS — G4733 Obstructive sleep apnea (adult) (pediatric): Secondary | ICD-10-CM | POA: Diagnosis not present

## 2022-11-06 ENCOUNTER — Ambulatory Visit: Payer: Medicare Other | Admitting: Pulmonary Disease

## 2022-11-22 ENCOUNTER — Telehealth: Payer: Self-pay | Admitting: Family Medicine

## 2022-11-22 ENCOUNTER — Encounter: Payer: Self-pay | Admitting: Family Medicine

## 2022-11-22 ENCOUNTER — Ambulatory Visit (INDEPENDENT_AMBULATORY_CARE_PROVIDER_SITE_OTHER): Payer: 59 | Admitting: Family Medicine

## 2022-11-22 VITALS — BP 116/73 | HR 105 | Temp 97.0°F | Ht 68.0 in | Wt 174.8 lb

## 2022-11-22 DIAGNOSIS — Z7984 Long term (current) use of oral hypoglycemic drugs: Secondary | ICD-10-CM

## 2022-11-22 DIAGNOSIS — E11649 Type 2 diabetes mellitus with hypoglycemia without coma: Secondary | ICD-10-CM

## 2022-11-22 DIAGNOSIS — Z794 Long term (current) use of insulin: Secondary | ICD-10-CM

## 2022-11-22 DIAGNOSIS — Z7985 Long-term (current) use of injectable non-insulin antidiabetic drugs: Secondary | ICD-10-CM | POA: Diagnosis not present

## 2022-11-22 DIAGNOSIS — E1159 Type 2 diabetes mellitus with other circulatory complications: Secondary | ICD-10-CM | POA: Diagnosis not present

## 2022-11-22 DIAGNOSIS — I152 Hypertension secondary to endocrine disorders: Secondary | ICD-10-CM

## 2022-11-22 DIAGNOSIS — E119 Type 2 diabetes mellitus without complications: Secondary | ICD-10-CM

## 2022-11-22 LAB — BAYER DCA HB A1C WAIVED: HB A1C (BAYER DCA - WAIVED): 6.4 % — ABNORMAL HIGH (ref 4.8–5.6)

## 2022-11-22 NOTE — Patient Instructions (Signed)

## 2022-11-22 NOTE — Progress Notes (Addendum)
Subjective:  [name], female    DOB: Jun 02, 1956, 67 y.o.   MRN: [id number]  Patient Care Team: Sonny Masters, FNP as PCP - General (Family Medicine) Sheran Luz, MD as Consulting Physician (Physical Medicine and Rehabilitation) Archer Asa, MD as Consulting Physician (Psychiatry) Beryle Beams, MD as Consulting Physician (Neurology) Danella Maiers, [facility] (Pharmacist) Glyn Ade, PA-C as Physician Assistant (Dermatology) Daisy Lazar, DO (Optometry)   Chief Complaint:  Diabetes (3 month follow up )   HPI: Cynthia Deleon is a 67 y.o. female presenting on 11/22/2022 for Diabetes (3 month follow up )   Diabetes She presents for her follow-up diabetic visit. She has type 2 diabetes mellitus. Her disease course has been stable. Hypoglycemia symptoms include mood changes, nervousness/anxiousness and sweats. Pertinent negatives for hypoglycemia include no confusion, dizziness, headaches, hunger, pallor, seizures, sleepiness, speech difficulty or tremors. Associated symptoms include foot paresthesias. Pertinent negatives for diabetes include no blurred vision, no chest pain, no fatigue, no foot ulcerations, no polydipsia, no polyphagia, no polyuria, no visual change, no weakness and no weight loss. Diabetic complications include peripheral neuropathy. Risk factors for coronary artery disease include dyslipidemia, diabetes mellitus, hypertension, post-menopausal and sedentary lifestyle. Current diabetic treatment includes insulin injections and oral agent (monotherapy). She is following a generally healthy diet. Blood glucose monitoring compliance is excellent (CGM use as prescribed and tolerating well). An ACE inhibitor/angiotensin II receptor blocker is being taken.  Hypertension This is a chronic problem. The problem is controlled. Associated symptoms include anxiety and sweats. Pertinent negatives include no blurred vision, chest pain, headaches,  malaise/fatigue, neck pain, orthopnea, palpitations, peripheral edema, PND or shortness of breath. Agents associated with hypertension include estrogens. Risk factors for coronary artery disease include diabetes mellitus, dyslipidemia and post-menopausal state. Past treatments include ACE inhibitors. The current treatment provides significant improvement. Compliance problems include diet and exercise.      Relevant past medical, surgical, family, and social history reviewed and updated as indicated.  Allergies and medications reviewed and updated. Data reviewed: Chart in Epic.   Past Medical History:  Diagnosis Date   Aortic atherosclerosis (HCC) 10/31/2021   Arthritis    hands and knees   Bipolar 1 disorder (HCC)    Borderline diabetic    Chronic back pain    Chronic neck pain    Complication of anesthesia    high anxiety-does not want to be alone   COPD (chronic obstructive pulmonary disease) (HCC)    Current use of estrogen therapy 01/12/2014   Depression    Diabetes mellitus without complication (HCC)    Diplopia    Family history of adverse reaction to anesthesia    sister "gas in lungs"   GERD (gastroesophageal reflux disease)    Headache    Hepatic steatosis 10/31/2021   Hot flashes 12/15/2013   Hypertension    IBS (irritable bowel syndrome)    Incontinence    Kidney stone    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HCC)    Osteopenia 02/06/2022   Panic attacks    Peptic ulcer    Peripheral neuropathy    Rosacea    Shingles    Sleep apnea    uses a cpap-with oxygen    Past Surgical History:  Procedure Laterality Date   BIOPSY  04/27/2020   Procedure: BIOPSY;  Surgeon: Bernette Redbird, MD;  Location: [facility];  Service: Endoscopy;;   BUNIONECTOMY WITH HAMMERTOE RECONSTRUCTION Right 12/10/2012   Procedure: RIGHT FIRST METATARSAL CHEVRON BUNION  CORRECTION,  2 AND 3 HAMMERTOE CORRECTION , RIGHT 3 AND 4 TOE NAIL EXCISION ;  Surgeon: Toni Arthurs, MD;  Location: [facility];  Service: Orthopedics;  Laterality: Right;   COLONOSCOPY WITH PROPOFOL N/A 04/27/2020   Procedure: COLONOSCOPY WITH PROPOFOL;  Surgeon: Bernette Redbird, MD;  Location: [facility];  Service: Endoscopy;  Laterality: N/A;   FOOT ARTHRODESIS  2000   both feet   LIPOSUCTION  03/2018   RECTAL SURGERY     TONSILLECTOMY     TOTAL ABDOMINAL HYSTERECTOMY      Social History   Socioeconomic History   Marital status: Divorced    Spouse name: Not on file   Number of children: 5   Years of education: 9th   Highest education level: Not on file  Occupational History   Occupation: Disabled  Tobacco Use   Smoking status: Former    Current packs/day: 0.00    Average packs/day: 1 pack/day for 42.0 years (42.0 ttl pk-yrs)    Types: Cigarettes    Start date: 06/11/1975    Quit date: 06/10/2017    Years since quitting: 5.5   Smokeless tobacco: Never  Vaping Use   Vaping status: Never Used  Substance and Sexual Activity   Alcohol use: No    Alcohol/week: 0.0 standard drinks of alcohol   Drug use: No   Sexual activity: Not Currently    Birth control/protection: Surgical    Comment: hyst  Other Topics Concern   Not on file  Social History Narrative   Lives at home with home with her son (has five children).   Right-handed.   1 cup caffeine per day.   Social Determinants of Health   Financial Resource Strain: Low Risk  (08/21/2022)   Overall Financial Resource Strain (CARDIA)    Difficulty of Paying Living Expenses: Not hard at all  Food Insecurity: No Food Insecurity (08/21/2022)   Hunger Vital Sign    Worried About Running Out of Food in the Last Year: Never true    Ran Out of Food in the Last Year: Never true  Transportation Needs: No Transportation Needs (08/21/2022)   PRAPARE - Administrator, Civil Service (Medical): No    Lack of Transportation (Non-Medical): No  Physical Activity: Inactive (08/21/2022)   Exercise Vital Sign    Days of Exercise per Week:  0 days    Minutes of Exercise per Session: 0 min  Stress: No Stress Concern Present (08/21/2022)   [employer] of Occupational Health - Occupational Stress Questionnaire    Feeling of Stress : Not at all  Social Connections: Moderately Integrated (08/21/2022)   Social Connection and Isolation Panel [NHANES]    Frequency of Communication with Friends and Family: More than three times a week    Frequency of Social Gatherings with Friends and Family: More than three times a week    Attends Religious Services: More than 4 times per year    Active Member of [employer] or Organizations: Yes    Attends Banker Meetings: More than 4 times per year    Marital Status: Divorced  Intimate Partner Violence: Not At Risk (08/21/2022)   Humiliation, Afraid, Rape, and Kick questionnaire    Fear of Current or Ex-Partner: No    Emotionally Abused: No    Physically Abused: No    Sexually Abused: No    Outpatient Encounter Medications as of 11/22/2022  Medication Sig   albuterol (VENTOLIN HFA) 108 (90 Base) MCG/ACT  inhaler Inhale 2 puffs into the lungs every 6 (six) hours as needed.   ARIPiprazole (ABILIFY) 10 MG tablet Take 10 mg by mouth every morning.   Artificial Saliva (BIOTENE DRY MOUTH MOISTURIZING) SOLN Use as directed 5 mLs in the mouth or throat in the morning and at bedtime.   aspirin EC 81 MG tablet Take 81 mg by mouth daily. Swallow whole.   cholecalciferol (VITAMIN D) 25 MCG tablet Take 1 tablet (1,000 Units total) by mouth daily.   ciprofloxacin-dexamethasone (CIPRODEX) OTIC suspension Place 4 drops into both ears 2 (two) times daily.   Continuous Blood Gluc Sensor (FREESTYLE LIBRE 2 SENSOR) MISC Apply to arm every 14 days to check blood sugar continuously. DX E11.65   diazepam (VALIUM) 5 MG tablet Take 5 mg by mouth 2 (two) times daily.   estrogens, conjugated, (PREMARIN) 0.3 MG tablet TAKE (1) TABLET BY MOUTH ONCE DAILY.   ezetimibe (ZETIA) 10 MG tablet TAKE (1) TABLET BY MOUTH  ONCE DAILY.   fluorometholone (FML) 0.1 % ophthalmic suspension SMARTSIG:In Eye(s)   fluticasone (FLONASE) 50 MCG/ACT nasal spray USE 2 SPRAYS IN EACH NOSTRIL ONCE DAILY. (Patient taking differently: Place 2 sprays into both nostrils daily.)   glipiZIDE (GLUCOTROL XL) 5 MG 24 hr tablet Take 1 tablet (5 mg total) by mouth 2 (two) times daily with a meal.   insulin lispro (HUMALOG KWIKPEN) 100 UNIT/ML KwikPen Use 5-10 units 2 times daily with breakfast and dinner when blood sugar is >200 on steroids   Insulin Pen Needle (PIP PEN NEEDLES 32G X ) 32G X 4 MM MISC Use to inject insulin 2 times daily as needed. DX E11.65   lamoTRIgine (LAMICTAL) 200 MG tablet Take 200 mg by mouth at bedtime. For mood disorder.   levocetirizine (XYZAL) 5 MG tablet Take 1 tablet (5 mg total) by mouth every evening.   meclizine (ANTIVERT) 25 MG tablet Take 2 tablets (50 mg total) by mouth daily as needed for dizziness.   mirabegron ER (MYRBETRIQ) 50 MG TB24 tablet Take 1 tablet (50 mg total) by mouth daily.   montelukast (SINGULAIR) 10 MG tablet TAKE (1) TABLET BY MOUTH AT BEDTIME.   OneTouch Delica Lancets 30G MISC Use to test blood sugar twice daily. DX: E11.9   oxyCODONE (ROXICODONE) 15 MG immediate release tablet Take 1 tablet (15 mg total) by mouth every 6 (six) hours as needed for pain.   pregabalin (LYRICA) 150 MG capsule Take 1 capsule (150 mg total) by mouth 3 (three) times daily.   promethazine (PHENERGAN) 12.5 MG tablet Take 1 tablet (12.5 mg total) by mouth every 8 (eight) hours as needed for nausea or vomiting.   tirzepatide (MOUNJARO) 7.5 MG/0.5ML Pen Inject 7.5 mg into the skin once a week. DX: E11.65   umeclidinium-vilanterol (ANORO ELLIPTA) 62.5-25 MCG/ACT AEPB Inhale 1 puff into the lungs daily.   valACYclovir (VALTREX) 1000 MG tablet Take 1 tablet (1,000 mg total) by mouth daily.   vitamin B-12 1000 MCG tablet Take 1 tablet (1,000 mcg total) by mouth daily.   VRAYLAR 3 MG capsule Take 3 mg by mouth at  bedtime.   [DISCONTINUED] atorvastatin (LIPITOR) 80 MG tablet Take 1 tablet (80 mg total) by mouth daily.   [DISCONTINUED] DULoxetine (CYMBALTA) 60 MG capsule TAKE (1) CAPSULE BY MOUTH EVERY DAY.   [DISCONTINUED] lansoprazole (PREVACID) 30 MG capsule Take 1 capsule (30 mg total) by mouth daily in the afternoon.   [DISCONTINUED] lisinopril (ZESTRIL) 5 MG tablet Take 1 tablet (5 mg total)  by mouth daily.   No facility-administered encounter medications on file as of 11/22/2022.    Allergies  Allergen Reactions   Divalproex Sodium Other (See Comments)    Hallucinations  Other reaction(s): Confusion   Other Anaphylaxis and Other (See Comments)   Pseudoeph-Hydrocodone-Gg Nausea Only   Sulfa Antibiotics Anaphylaxis, Nausea Only, Other (See Comments) and Swelling    Swelling of tongue.   Valproic Acid Other (See Comments)   Varenicline Other (See Comments)    Altered mental status-Per patient was put on allergy list by Dr Delbert Harness bu patient staes she is not allergic to this and is currently taking it.   Varenicline Tartrate Other (See Comments)    Other reaction(s): Confusion   Codeine Nausea And Vomiting and Other (See Comments)   Hydrocodone Nausea And Vomiting and Other (See Comments)   Ambien [Zolpidem] Other (See Comments)    Causes sleep walking   Clavulanic Acid Diarrhea   Elemental Sulfur Other (See Comments)    Swelling of tongue   Hydrocod Poli-Chlorphe Poli Er Other (See Comments)    Other reaction(s): Skin itches    Macrobid [Nitrofurantoin] Nausea And Vomiting    Dizziness, nausea, vomiting   Paxil [Paroxetine Hcl] Other (See Comments)    Causes ringing in the ears.    Prednisone Other (See Comments)    Other reaction(s): Unknown    Amoxicillin Rash    Has patient had a PCN reaction causing immediate rash, facial/tongue/throat swelling, SOB or lightheadedness with hypotension: No Has patient had a PCN reaction causing severe rash involving mucus membranes or skin  necrosis: No Has patient had a PCN reaction that required hospitalization: No Has patient had a PCN reaction occurring within the last 10 years: Yes If all of the above answers are "NO", then may proceed with Cephalosporin use.    [place] [Dapagliflozin]     RECURRENT YEAST/UTI   Metformin And Related Diarrhea   Tape Rash    Review of Systems  Constitutional:  Negative for activity change, appetite change, chills, diaphoresis, fatigue, fever, malaise/fatigue, unexpected weight change and weight loss.  HENT: Negative.    Eyes: Negative.  Negative for blurred vision, photophobia and visual disturbance.  Respiratory:  Negative for cough, chest tightness and shortness of breath.   Cardiovascular:  Negative for chest pain, palpitations, orthopnea, leg swelling and PND.  Gastrointestinal:  Negative for abdominal pain, blood in stool, constipation, diarrhea, nausea and vomiting.  Endocrine: Negative.  Negative for polydipsia, polyphagia and polyuria.  Genitourinary:  Negative for decreased urine volume, difficulty urinating, dysuria, frequency and urgency.  Musculoskeletal:  Positive for arthralgias, back pain and myalgias. Negative for neck pain.  Skin: Negative.  Negative for pallor.  Allergic/Immunologic: Negative.   Neurological:  Positive for numbness. Negative for dizziness, tremors, seizures, syncope, facial asymmetry, speech difficulty, weakness, light-headedness and headaches.  Hematological: Negative.   Psychiatric/Behavioral:  Negative for confusion, hallucinations, sleep disturbance and suicidal ideas. The patient is nervous/anxious.   All other systems reviewed and are negative.       Objective:  BP 116/73   Pulse (!) 105   Temp (!) 97 F (36.1 C) (Temporal)   Ht 5\' 8"  (1.727 m)   Wt 174 lb 12.8 oz (79.3 kg)   SpO2 91%   BMI 26.58 kg/m    Wt Readings from Last 3 Encounters:  12/19/22 172 lb 9.6 oz (78.3 kg)  11/22/22 174 lb 12.8 oz (79.3 kg)  10/28/22 177 lb (80.3  kg)    Physical  Exam Vitals and nursing note reviewed.  Constitutional:      General: She is not in acute distress.    Appearance: Normal appearance. She is overweight. She is not ill-appearing, toxic-appearing or diaphoretic.  HENT:     Head: Normocephalic and atraumatic.     Nose: Nose normal.     Mouth/Throat:     Mouth: Mucous membranes are moist.  Eyes:     Pupils: Pupils are equal, round, and reactive to light.  Cardiovascular:     Rate and Rhythm: Normal rate and regular rhythm.     Heart sounds: Normal heart sounds.  Pulmonary:     Effort: Pulmonary effort is normal.     Breath sounds: Normal breath sounds.  Abdominal:     General: Abdomen is flat.  Musculoskeletal:     Right lower leg: No edema.     Left lower leg: No edema.  Skin:    General: Skin is dry.     Capillary Refill: Capillary refill takes less than 2 seconds.  Neurological:     General: No focal deficit present.     Mental Status: She is alert and oriented to person, place, and time. Mental status is at baseline.     Results for orders placed or performed in visit on 11/22/22  Lipid panel  Result Value Ref Range   Cholesterol, Total 137 100 - 199 mg/dL   Triglycerides 161 0 - 149 mg/dL   HDL 46 >09 mg/dL   VLDL Cholesterol Cal 25 5 - 40 mg/dL   LDL Chol Calc (NIH) 66 0 - 99 mg/dL   Chol/HDL Ratio 3.0 0.0 - 4.4 ratio  CMP14+EGFR  Result Value Ref Range   Glucose 204 (H) 70 - 99 mg/dL   BUN 11 8 - 27 mg/dL   Creatinine, Ser 6.04 0.57 - 1.00 mg/dL   eGFR 71 >54 UJ/WJX/9.14   BUN/Creatinine Ratio 12 12 - 28   Sodium 141 134 - 144 mmol/L   Potassium 4.3 3.5 - 5.2 mmol/L   Chloride 103 96 - 106 mmol/L   CO2 24 20 - 29 mmol/L   Calcium 9.8 8.7 - 10.3 mg/dL   Total Protein 6.4 6.0 - 8.5 g/dL   Albumin 4.4 3.9 - 4.9 g/dL   Globulin, Total 2.0 1.5 - 4.5 g/dL   Bilirubin Total 0.5 0.0 - 1.2 mg/dL   Alkaline Phosphatase 71 44 - 121 IU/L   AST 18 0 - 40 IU/L   ALT 11 0 - 32 IU/L  Bayer DCA Hb A1c  Waived  Result Value Ref Range   HB A1C (BAYER DCA - WAIVED) 6.4 (H) 4.8 - 5.6 %       Pertinent labs & imaging results that were available during my care of the patient were reviewed by me and considered in my medical decision making.  Assessment & Plan:  Catera was seen today for diabetes.  Diagnoses and all orders for this visit:  Type 2 diabetes mellitus with hypoglycemia without coma, without long-term current use of insulin (HCC) A1C 6.4 in office today, continue current regimen. Continue CGM for glucose monitoring.  -     Bayer DCA Hb A1c Waived -     Ambulatory referral to Cardiology  Hypertension associated with diabetes (HCC) BP well controlled. Changes were not made in regimen today. Goal BP is 130/80. Pt aware to report any persistent high or low readings. DASH diet and exercise encouraged. Exercise at least 150 minutes per week and increase  as tolerated. Goal BMI > 25. Stress management encouraged. Avoid nicotine and tobacco product use. Avoid excessive alcohol and NSAID's. Avoid more than 2000 mg of sodium daily. Medications as prescribed. Follow up as scheduled.  -     Lipid panel -     CMP14+EGFR -     Ambulatory referral to Cardiology     Continue all other maintenance medications.  Follow up plan: Return in about 3 months (around 02/22/2023) for DM.   Continue healthy lifestyle choices, including diet (rich in fruits, vegetables, and lean proteins, and low in salt and simple carbohydrates) and exercise (at least 30 minutes of moderate physical activity daily).  Educational handout given for DM  The above assessment and management plan was discussed with the patient. The patient verbalized understanding of and has agreed to the management plan. Patient is aware to call the clinic if they develop any new symptoms or if symptoms persist or worsen. Patient is aware when to return to the clinic for a follow-up visit. Patient educated on when it is appropriate to go to  the emergency department.   Kari Baars, FNP-C [facility] [phone]

## 2022-11-23 LAB — CMP14+EGFR
ALT: 11 IU/L (ref 0–32)
AST: 18 IU/L (ref 0–40)
Albumin: 4.4 g/dL (ref 3.9–4.9)
Alkaline Phosphatase: 71 IU/L (ref 44–121)
BUN/Creatinine Ratio: 12 (ref 12–28)
BUN: 11 mg/dL (ref 8–27)
Bilirubin Total: 0.5 mg/dL (ref 0.0–1.2)
CO2: 24 mmol/L (ref 20–29)
Calcium: 9.8 mg/dL (ref 8.7–10.3)
Chloride: 103 mmol/L (ref 96–106)
Creatinine, Ser: 0.89 mg/dL (ref 0.57–1.00)
Globulin, Total: 2 g/dL (ref 1.5–4.5)
Glucose: 204 mg/dL — ABNORMAL HIGH (ref 70–99)
Potassium: 4.3 mmol/L (ref 3.5–5.2)
Sodium: 141 mmol/L (ref 134–144)
Total Protein: 6.4 g/dL (ref 6.0–8.5)
eGFR: 71 mL/min/{1.73_m2} (ref >59)

## 2022-11-23 LAB — LIPID PANEL
Chol/HDL Ratio: 3 ratio (ref 0.0–4.4)
Cholesterol, Total: 137 mg/dL (ref 100–199)
HDL: 46 mg/dL (ref >39)
LDL Chol Calc (NIH): 66 mg/dL (ref 0–99)
Triglycerides: 141 mg/dL (ref 0–149)
VLDL Cholesterol Cal: 25 mg/dL (ref 5–40)

## 2022-11-25 ENCOUNTER — Telehealth: Payer: Self-pay | Admitting: *Deleted

## 2022-11-25 DIAGNOSIS — G4733 Obstructive sleep apnea (adult) (pediatric): Secondary | ICD-10-CM

## 2022-11-25 NOTE — Telephone Encounter (Signed)
Patient calling to get new orders for mask/supplies with [employer].   Please call and advise  [phone]

## 2022-11-26 ENCOUNTER — Other Ambulatory Visit: Payer: Self-pay | Admitting: Pharmacist

## 2022-11-26 NOTE — Telephone Encounter (Signed)
Called and spoke w/ pt she verbalized that she was needing new supplies and a mask. Dr.Sood please advise if you are ok w/ me placing order?

## 2022-11-26 NOTE — Telephone Encounter (Signed)
I have placed order for new CPAP mask and supplies.

## 2022-11-26 NOTE — Telephone Encounter (Signed)
Appt scheduled for 12/10/22.

## 2022-11-26 NOTE — Progress Notes (Signed)
   11/26/2022 Name: Cynthia Deleon MRN: [id number] DOB: 07-Mar-1956  Discussed diabetes with patient and reviewed libre 2 CGM device (phone not compatible).  Patient's A1c is at goal (currently 6.4%), however she is now experiencing some lower blood sugars.  We discussed stopping the glipizide and eating 3 healthy plate meals daily.  Patient is walking 3-4 weekly for exercise.  I will f/u with her re: Bgs in 3 weeks.  Appt confirmed with patient.  Medication Access/Adherence  Current Pharmacy:  [facility], [place] - [address] [place] [address] Phone: [phone] Fax: [phone]   Objective:  Lab Results  Component Value Date   HGBA1C 6.4 (H) 11/22/2022    Lab Results  Component Value Date   CREATININE 0.89 11/22/2022   BUN 11 11/22/2022   NA 141 11/22/2022   K 4.3 11/22/2022   CL 103 11/22/2022   CO2 24 11/22/2022    Lab Results  Component Value Date   CHOL 137 11/22/2022   HDL 46 11/22/2022   LDLCALC 66 11/22/2022   TRIG 141 11/22/2022   CHOLHDL 3.0 11/22/2022     Kieth Brightly, PharmD, BCACP Clinical Pharmacist, [facility]

## 2022-11-28 DIAGNOSIS — G4733 Obstructive sleep apnea (adult) (pediatric): Secondary | ICD-10-CM | POA: Diagnosis not present

## 2022-12-01 DIAGNOSIS — G4733 Obstructive sleep apnea (adult) (pediatric): Secondary | ICD-10-CM | POA: Diagnosis not present

## 2022-12-10 ENCOUNTER — Ambulatory Visit: Payer: 59

## 2022-12-13 ENCOUNTER — Ambulatory Visit (HOSPITAL_COMMUNITY): Admission: RE | Admit: 2022-12-13 | Payer: 59 | Source: Ambulatory Visit

## 2022-12-17 ENCOUNTER — Ambulatory Visit: Payer: 59 | Admitting: Pulmonary Disease

## 2022-12-17 DIAGNOSIS — M542 Cervicalgia: Secondary | ICD-10-CM | POA: Diagnosis not present

## 2022-12-17 DIAGNOSIS — M25511 Pain in right shoulder: Secondary | ICD-10-CM | POA: Diagnosis not present

## 2022-12-18 ENCOUNTER — Telehealth: Payer: Self-pay | Admitting: Family Medicine

## 2022-12-18 ENCOUNTER — Other Ambulatory Visit: Payer: Self-pay | Admitting: Family Medicine

## 2022-12-18 ENCOUNTER — Other Ambulatory Visit: Payer: Self-pay | Admitting: Cardiovascular Disease

## 2022-12-18 ENCOUNTER — Other Ambulatory Visit: Payer: Self-pay | Admitting: Neurology

## 2022-12-18 DIAGNOSIS — E1159 Type 2 diabetes mellitus with other circulatory complications: Secondary | ICD-10-CM

## 2022-12-18 DIAGNOSIS — E1169 Type 2 diabetes mellitus with other specified complication: Secondary | ICD-10-CM

## 2022-12-18 DIAGNOSIS — E11649 Type 2 diabetes mellitus with hypoglycemia without coma: Secondary | ICD-10-CM

## 2022-12-18 DIAGNOSIS — K219 Gastro-esophageal reflux disease without esophagitis: Secondary | ICD-10-CM

## 2022-12-18 NOTE — Telephone Encounter (Signed)
Please call patient to schedule a DM follow up with Cynthia Deleon.

## 2022-12-18 NOTE — Telephone Encounter (Signed)
I am unsure if we are to refill this as mccue np last note stated:  Follow up to be determined after review/completion of MR thoracic spine. If normal imaging, will further discuss with Dr. Terrace Arabia if routine follow-up visit indicated at that time

## 2022-12-19 ENCOUNTER — Ambulatory Visit (INDEPENDENT_AMBULATORY_CARE_PROVIDER_SITE_OTHER): Payer: 59 | Admitting: Family Medicine

## 2022-12-19 ENCOUNTER — Encounter: Payer: Self-pay | Admitting: Family Medicine

## 2022-12-19 DIAGNOSIS — M25511 Pain in right shoulder: Secondary | ICD-10-CM | POA: Diagnosis not present

## 2022-12-19 DIAGNOSIS — M5416 Radiculopathy, lumbar region: Secondary | ICD-10-CM | POA: Diagnosis not present

## 2022-12-19 DIAGNOSIS — S161XXA Strain of muscle, fascia and tendon at neck level, initial encounter: Secondary | ICD-10-CM
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
## 2022-12-25 DIAGNOSIS — E119 Type 2 diabetes mellitus without complications: Secondary | ICD-10-CM | POA: Diagnosis not present

## 2022-12-25 LAB — HM DIABETES EYE EXAM

## 2022-12-26 DIAGNOSIS — M25511 Pain in right shoulder: Secondary | ICD-10-CM | POA: Diagnosis not present

## 2022-12-27 ENCOUNTER — Other Ambulatory Visit (HOSPITAL_COMMUNITY): Payer: Self-pay | Admitting: Orthopedic Surgery

## 2022-12-27 DIAGNOSIS — M25511 Pain in right shoulder: Secondary | ICD-10-CM

## 2022-12-31 DIAGNOSIS — G4733 Obstructive sleep apnea (adult) (pediatric): Secondary | ICD-10-CM | POA: Diagnosis not present

## 2023-01-06 DIAGNOSIS — M5412 Radiculopathy, cervical region: Secondary | ICD-10-CM | POA: Diagnosis not present

## 2023-01-06 DIAGNOSIS — Z79899 Other long term (current) drug therapy: Secondary | ICD-10-CM | POA: Diagnosis not present

## 2023-01-06 DIAGNOSIS — M5136 Other intervertebral disc degeneration, lumbar region: Secondary | ICD-10-CM | POA: Diagnosis not present

## 2023-01-06 DIAGNOSIS — G894 Chronic pain syndrome: Secondary | ICD-10-CM | POA: Diagnosis not present

## 2023-01-06 DIAGNOSIS — M5030 Other cervical disc degeneration, unspecified cervical region: Secondary | ICD-10-CM | POA: Diagnosis not present

## 2023-01-06 NOTE — Progress Notes (Unsigned)
Cardiology Office Note:   Date:  01/08/2023  NAME:  Cynthia Deleon    MRN: [id number] DOB:  1955-07-15   PCP:  Sonny Masters, FNP  Cardiologist:  None  Electrophysiologist:  None   Referring MD: Sonny Masters, FNP   Chief Complaint  Patient presents with   Follow-up         History of Present Illness:   Cynthia Deleon is a 67 y.o. female with a hx of coronary calcifications, DM, HTN, HLD who presents for follow-up.  She reports she is doing well.  She can have intermittent sensation of shortness of breath.  She describes a gasp for air.  This can occur with sitting or with activity.  She does have moderate COPD and is treated.  Recent labs show stable LDL cholesterol.  This is at goal.  She does have minimal coronary calcifications in the LAD distribution.  She reports no chest pain or pressure.  Her EKG is normal.  Her blood pressure is well-controlled.  CV exam unchanged.  All of her labs seem to be within limits.  Her echo last year was normal.  Her symptoms can occur with sitting or with activity.  Described as gasping.  Overall does not really sound like her heart.  Problem List COPD -30 years 1 ppd -moderate COPD -quite 2019 OSA DM -A1c 6.4 4. HLD -T chol 137, HDL 46, LDL 66, TG 141 5. HTN 6. Coronary calcifications  -Mild LAD  Past Medical History: Past Medical History:  Diagnosis Date   Aortic atherosclerosis (HCC) 10/31/2021   Arthritis    hands and knees   Bipolar 1 disorder (HCC)    Borderline diabetic    Chronic back pain    Chronic neck pain    Complication of anesthesia    high anxiety-does not want to be alone   COPD (chronic obstructive pulmonary disease) (HCC)    Current use of estrogen therapy 01/12/2014   Depression    Diabetes mellitus without complication (HCC)    Diplopia    Family history of adverse reaction to anesthesia    sister "gas in lungs"   GERD (gastroesophageal reflux disease)    Headache    Hepatic steatosis 10/31/2021    Hot flashes 12/15/2013   Hypertension    IBS (irritable bowel syndrome)    Incontinence    Kidney stone    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HCC)    Osteopenia 02/06/2022   Panic attacks    Peptic ulcer    Peripheral neuropathy    Rosacea    Shingles    Sleep apnea    uses a cpap-with oxygen    Past Surgical History: Past Surgical History:  Procedure Laterality Date   BIOPSY  04/27/2020   Procedure: BIOPSY;  Surgeon: Bernette Redbird, MD;  Location: [facility];  Service: Endoscopy;;   BUNIONECTOMY WITH HAMMERTOE RECONSTRUCTION Right 12/10/2012   Procedure: RIGHT FIRST METATARSAL CHEVRON BUNION CORRECTION,  2 AND 3 HAMMERTOE CORRECTION , RIGHT 3 AND 4 TOE NAIL EXCISION ;  Surgeon: Toni Arthurs, MD;  Location: [facility];  Service: Orthopedics;  Laterality: Right;   COLONOSCOPY WITH PROPOFOL N/A 04/27/2020   Procedure: COLONOSCOPY WITH PROPOFOL;  Surgeon: Bernette Redbird, MD;  Location: [facility];  Service: Endoscopy;  Laterality: N/A;   FOOT ARTHRODESIS  2000   both feet   LIPOSUCTION  03/2018   RECTAL SURGERY     TONSILLECTOMY     TOTAL ABDOMINAL HYSTERECTOMY  Current Medications: Current Meds  Medication Sig   albuterol (VENTOLIN HFA) 108 (90 Base) MCG/ACT inhaler Inhale 2 puffs into the lungs every 6 (six) hours as needed.   ARIPiprazole (ABILIFY) 10 MG tablet Take 10 mg by mouth every morning.   Artificial Saliva (BIOTENE DRY MOUTH MOISTURIZING) SOLN Use as directed 5 mLs in the mouth or throat in the morning and at bedtime.   aspirin EC 81 MG tablet Take 81 mg by mouth daily. Swallow whole.   cholecalciferol (VITAMIN D) 25 MCG tablet Take 1 tablet (1,000 Units total) by mouth daily.   ciprofloxacin-dexamethasone (CIPRODEX) OTIC suspension Place 4 drops into both ears 2 (two) times daily.   Continuous Blood Gluc Sensor (FREESTYLE LIBRE 2 SENSOR) MISC Apply to arm every 14 days to check blood sugar continuously. DX E11.65   diazepam (VALIUM) 5 MG  tablet Take 5 mg by mouth 2 (two) times daily.   DULoxetine (CYMBALTA) 60 MG capsule TAKE (1) CAPSULE BY MOUTH EVERY DAY.   estrogens, conjugated, (PREMARIN) 0.3 MG tablet TAKE (1) TABLET BY MOUTH ONCE DAILY.   fluorometholone (FML) 0.1 % ophthalmic suspension SMARTSIG:In Eye(s)   fluticasone (FLONASE) 50 MCG/ACT nasal spray USE 2 SPRAYS IN EACH NOSTRIL ONCE DAILY. (Patient taking differently: Place 2 sprays into both nostrils daily.)   glipiZIDE (GLUCOTROL XL) 5 MG 24 hr tablet Take 1 tablet (5 mg total) by mouth 2 (two) times daily with a meal.   insulin lispro (HUMALOG KWIKPEN) 100 UNIT/ML KwikPen Use 5-10 units 2 times daily with breakfast and dinner when blood sugar is >200 on steroids   Insulin Pen Needle (PIP PEN NEEDLES 32G X ) 32G X 4 MM MISC Use to inject insulin 2 times daily as needed. DX E11.65   lamoTRIgine (LAMICTAL) 200 MG tablet Take 200 mg by mouth at bedtime. For mood disorder.   lansoprazole (PREVACID) 30 MG capsule TAKE (1) CAPSULE BY MOUTH ONCE DAILY.   levocetirizine (XYZAL) 5 MG tablet Take 1 tablet (5 mg total) by mouth every evening.   lisinopril (ZESTRIL) 5 MG tablet TAKE (1) TABLET BY MOUTH ONCE DAILY.   meclizine (ANTIVERT) 25 MG tablet Take 2 tablets (50 mg total) by mouth daily as needed for dizziness.   mirabegron ER (MYRBETRIQ) 50 MG TB24 tablet Take 1 tablet (50 mg total) by mouth daily.   montelukast (SINGULAIR) 10 MG tablet TAKE (1) TABLET BY MOUTH AT BEDTIME.   OneTouch Delica Lancets 30G MISC Use to test blood sugar twice daily. DX: E11.9   oxyCODONE (ROXICODONE) 15 MG immediate release tablet Take 1 tablet (15 mg total) by mouth every 6 (six) hours as needed for pain.   pregabalin (LYRICA) 150 MG capsule Take 1 capsule (150 mg total) by mouth 3 (three) times daily.   promethazine (PHENERGAN) 12.5 MG tablet Take 1 tablet (12.5 mg total) by mouth every 8 (eight) hours as needed for nausea or vomiting.   tirzepatide (MOUNJARO) 7.5 MG/0.5ML Pen Inject 7.5 mg  into the skin once a week. DX: E11.65   umeclidinium-vilanterol (ANORO ELLIPTA) 62.5-25 MCG/ACT AEPB Inhale 1 puff into the lungs daily.   valACYclovir (VALTREX) 1000 MG tablet Take 1 tablet (1,000 mg total) by mouth daily.   vitamin B-12 1000 MCG tablet Take 1 tablet (1,000 mcg total) by mouth daily.   VRAYLAR 3 MG capsule Take 3 mg by mouth at bedtime.   [DISCONTINUED] atorvastatin (LIPITOR) 80 MG tablet TAKE (1) TABLET BY MOUTH ONCE DAILY.   [DISCONTINUED] ezetimibe (ZETIA) 10 MG  tablet TAKE (1) TABLET BY MOUTH ONCE DAILY.     Allergies:    Divalproex sodium, Other, Pseudoeph-hydrocodone-gg, Sulfa antibiotics, Valproic acid, Varenicline, Varenicline tartrate, Codeine, Hydrocodone, Ambien [zolpidem], Clavulanic acid, Elemental sulfur, Hydrocod poli-chlorphe poli er, Macrobid [nitrofurantoin], Paxil [paroxetine hcl], Prednisone, Amoxicillin, Farxiga [dapagliflozin], Metformin and related, and Tape   Social History: Social History   Socioeconomic History   Marital status: Divorced    Spouse name: Not on file   Number of children: 5   Years of education: 9th   Highest education level: Not on file  Occupational History   Occupation: Disabled  Tobacco Use   Smoking status: Former    Current packs/day: 0.00    Average packs/day: 1 pack/day for 42.0 years (42.0 ttl pk-yrs)    Types: Cigarettes    Start date: 06/11/1975    Quit date: 06/10/2017    Years since quitting: 5.5   Smokeless tobacco: Never  Vaping Use   Vaping status: Never Used  Substance and Sexual Activity   Alcohol use: No    Alcohol/week: 0.0 standard drinks of alcohol   Drug use: No   Sexual activity: Not Currently    Birth control/protection: Surgical    Comment: hyst  Other Topics Concern   Not on file  Social History Narrative   Lives at home with home with her son (has five children).   Right-handed.   1 cup caffeine per day.   Social Determinants of Health   Financial Resource Strain: Low Risk  (08/21/2022)    Overall Financial Resource Strain (CARDIA)    Difficulty of Paying Living Expenses: Not hard at all  Food Insecurity: No Food Insecurity (08/21/2022)   Hunger Vital Sign    Worried About Running Out of Food in the Last Year: Never true    Ran Out of Food in the Last Year: Never true  Transportation Needs: No Transportation Needs (08/21/2022)   PRAPARE - Administrator, Civil Service (Medical): No    Lack of Transportation (Non-Medical): No  Physical Activity: Inactive (08/21/2022)   Exercise Vital Sign    Days of Exercise per Week: 0 days    Minutes of Exercise per Session: 0 min  Stress: No Stress Concern Present (08/21/2022)   [employer] of Occupational Health - Occupational Stress Questionnaire    Feeling of Stress : Not at all  Social Connections: Moderately Integrated (08/21/2022)   Social Connection and Isolation Panel [NHANES]    Frequency of Communication with Friends and Family: More than three times a week    Frequency of Social Gatherings with Friends and Family: More than three times a week    Attends Religious Services: More than 4 times per year    Active Member of [employer] or Organizations: Yes    Attends Engineer, structural: More than 4 times per year    Marital Status: Divorced     Family History: The patient's family history includes Alcohol abuse in her brother, brother, brother, and brother; COPD in her brother, father, and sister; Diabetes in her maternal grandfather, maternal grandmother, mother, and sister; Heart attack in her sister; Hyperlipidemia in her sister; Other in her brother, mother, and sister. There is no history of Breast cancer.  ROS:   All other ROS reviewed and negative. Pertinent positives noted in the HPI.     EKGs/Labs/Other Studies Reviewed:   The following studies were personally reviewed by me today:  EKG:  EKG is ordered today.    EKG  Interpretation Date/Time:  Wednesday January 08 2023 13:43:04 EDT Ventricular  Rate:  98 PR Interval:  158 QRS Duration:  82 QT Interval:  336 QTC Calculation: 428 R Axis:   63  Text Interpretation: Normal sinus rhythm Normal ECG Confirmed by Lennie Odor ([id number]) on 01/08/2023 1:43:45 PM   Recent Labs: 05/22/2022: Hemoglobin 15.1; Platelets 212 11/22/2022: ALT 11; BUN 11; Creatinine, Ser 0.89; Potassium 4.3; Sodium 141   Recent Lipid Panel    Component Value Date/Time   CHOL 137 11/22/2022 1452   TRIG 141 11/22/2022 1452   HDL 46 11/22/2022 1452   CHOLHDL 3.0 11/22/2022 1452   CHOLHDL 4.1 11/16/2015 0715   VLDL 48 (H) 11/16/2015 0715   LDLCALC 66 11/22/2022 1452    Physical Exam:   VS:  BP 124/84   Pulse 98   Ht 5\' 9"  (1.753 m)   Wt 171 lb 6.4 oz (77.7 kg)   SpO2 93%   BMI 25.31 kg/m    Wt Readings from Last 3 Encounters:  01/08/23 171 lb 6.4 oz (77.7 kg)  12/19/22 172 lb 9.6 oz (78.3 kg)  11/22/22 174 lb 12.8 oz (79.3 kg)    General: Well nourished, well developed, in no acute distress Head: Atraumatic, normal size  Eyes: PEERLA, EOMI  Neck: Supple, no JVD Endocrine: No thryomegaly Cardiac: Normal S1, S2; RRR; no murmurs, rubs, or gallops Lungs: Clear to auscultation bilaterally, no wheezing, rhonchi or rales  Abd: Soft, nontender, no hepatomegaly  Ext: No edema, pulses 2+ Musculoskeletal: No deformities, BUE and BLE strength normal and equal Skin: Warm and dry, no rashes   Neuro: Alert and oriented to person, place, time, and situation, CNII-XII grossly intact, no focal deficits  Psych: Normal mood and affect   ASSESSMENT:   JOI DASGUPTA is a 67 y.o. female who presents for the following: 1. SOB (shortness of breath) on exertion   2. Coronary artery calcification seen on computed tomography   3. Mixed hyperlipidemia   4. Primary hypertension   5. Type 2 diabetes mellitus with hypoglycemia without coma, without long-term current use of insulin (HCC)     PLAN:   1. SOB (shortness of breath) on exertion -Described as gasping  sensation.  Echo normal.  EKG normal.  Mild coronary calcium seen on chest CT.  No real symptoms of angina.  Symptoms can occur with sitting or with activity.  No chest pain or pressure.  Overall suspect this is pulmonary related.  I see no need for stress test or coronary CTA at this time.  She will follow-up with pulmonary.  If they feel strongly about possible cardiac etiology we could consider coronary CTA.  2. Coronary artery calcification seen on computed tomography 3. Mixed hyperlipidemia -Mild LAD coronary calcium.  She is on aspirin, Lipitor and Zetia.  Her LDL cholesterol is less than 70 which is at goal.  She is diabetic.  This number is controlled as well.  She will continue current medications.  4. Primary hypertension -Continue current regimen.  Well-controlled.        Disposition: Return in about 1 year (around 01/08/2024).  Medication Adjustments/Labs and Tests Ordered: Current medicines are reviewed at length with the patient today.  Concerns regarding medicines are outlined above.  Orders Placed This Encounter  Procedures   EKG 12-Lead   Meds ordered this encounter  Medications   atorvastatin (LIPITOR) 80 MG tablet    Sig: Take 1 tablet (80 mg total) by mouth daily.    Dispense:  90 tablet    Refill:  3   ezetimibe (ZETIA) 10 MG tablet    Sig: TAKE (1) TABLET BY MOUTH ONCE DAILY.    Dispense:  30 tablet    Refill:  5   Patient Instructions  Medication Instructions:  REFILLS HAVE BEEN SENT TO YOUR LOCAL PHARMACY *If you need a refill on your cardiac medications before your next appointment, please call your pharmacy*   Lab Work: NONE If you have labs (blood work) drawn today and your tests are completely normal, you will receive your results only by: MyChart Message (if you have MyChart) OR A paper copy in the mail If you have any lab test that is abnormal or we need to change your treatment, we will call you to review the  results.   Testing/Procedures: NONE   Follow-Up: At [facility], you and your health needs are our priority.  As part of our continuing mission to provide you with exceptional heart care, we have created designated Provider Care Teams.  These Care Teams include your primary Cardiologist (physician) and Advanced Practice Providers (APPs -  Physician Assistants and Nurse Practitioners) who all work together to provide you with the care you need, when you need it.  We recommend signing up for the patient portal called "MyChart".  Sign up information is provided on this After Visit Summary.  MyChart is used to connect with patients for Virtual Visits (Telemedicine).  Patients are able to view lab/test results, encounter notes, upcoming appointments, etc.  Non-urgent messages can be sent to your provider as well.   To learn more about what you can do with MyChart, go to [email or web address].    Your next appointment:   1 year(s)  Provider:   DR. Scharlene Gloss OR APP        Time Spent with Patient: I have spent a total of 25 minutes with patient reviewing hospital notes, telemetry, EKGs, labs and examining the patient as well as establishing an assessment and plan that was discussed with the patient.  > 50% of time was spent in direct patient care.  Signed, Lenna Gilford. Flora Lipps, MD, [facility]  [facility]  [address] [place], [place] [address] [phone]  01/08/2023 2:50 PM

## 2023-01-08 ENCOUNTER — Encounter: Payer: Self-pay | Admitting: Cardiovascular Disease

## 2023-01-08 ENCOUNTER — Other Ambulatory Visit: Payer: Self-pay | Admitting: Family Medicine

## 2023-01-08 ENCOUNTER — Ambulatory Visit: Payer: 59 | Attending: Cardiovascular Disease | Admitting: Cardiovascular Disease

## 2023-01-08 VITALS — BP 124/84 | HR 98 | Ht 69.0 in | Wt 171.4 lb

## 2023-01-08 DIAGNOSIS — Z79899 Other long term (current) drug therapy: Secondary | ICD-10-CM

## 2023-01-08 DIAGNOSIS — I10 Essential (primary) hypertension: Secondary | ICD-10-CM

## 2023-01-08 DIAGNOSIS — Z7984 Long term (current) use of oral hypoglycemic drugs: Secondary | ICD-10-CM | POA: Diagnosis not present

## 2023-01-08 DIAGNOSIS — I2510 Atherosclerotic heart disease of native coronary artery without angina pectoris: Secondary | ICD-10-CM

## 2023-01-08 DIAGNOSIS — E782 Mixed hyperlipidemia: Secondary | ICD-10-CM

## 2023-01-08 DIAGNOSIS — M25562 Pain in left knee: Secondary | ICD-10-CM | POA: Diagnosis not present

## 2023-01-08 DIAGNOSIS — E11649 Type 2 diabetes mellitus with hypoglycemia without coma: Secondary | ICD-10-CM

## 2023-01-08 DIAGNOSIS — R0602 Shortness of breath: Secondary | ICD-10-CM | POA: Diagnosis not present

## 2023-01-08 DIAGNOSIS — E1142 Type 2 diabetes mellitus with diabetic polyneuropathy: Secondary | ICD-10-CM

## 2023-01-08 DIAGNOSIS — E1169 Type 2 diabetes mellitus with other specified complication: Secondary | ICD-10-CM

## 2023-01-08 DIAGNOSIS — M25561 Pain in right knee: Secondary | ICD-10-CM | POA: Diagnosis not present

## 2023-01-08 DIAGNOSIS — K219 Gastro-esophageal reflux disease without esophagitis: Secondary | ICD-10-CM

## 2023-01-08 MED ORDER — ATORVASTATIN CALCIUM 80 MG PO TABS
80.0000 mg | ORAL_TABLET | Freq: Every day | ORAL | 3 refills | Status: DC
Start: 2023-01-08 — End: 2023-12-08

## 2023-01-08 MED ORDER — EZETIMIBE 10 MG PO TABS
ORAL_TABLET | ORAL | 5 refills | Status: DC
Start: 2023-01-08 — End: 2023-07-09

## 2023-01-08 NOTE — Patient Instructions (Signed)
Medication Instructions:  REFILLS HAVE BEEN SENT TO YOUR LOCAL PHARMACY *If you need a refill on your cardiac medications before your next appointment, please call your pharmacy*   Lab Work: NONE If you have labs (blood work) drawn today and your tests are completely normal, you will receive your results only by: MyChart Message (if you have MyChart) OR A paper copy in the mail If you have any lab test that is abnormal or we need to change your treatment, we will call you to review the results.   Testing/Procedures: NONE   Follow-Up: At [facility], you and your health needs are our priority.  As part of our continuing mission to provide you with exceptional heart care, we have created designated Provider Care Teams.  These Care Teams include your primary Cardiologist (physician) and Advanced Practice Providers (APPs -  Physician Assistants and Nurse Practitioners) who all work together to provide you with the care you need, when you need it.  We recommend signing up for the patient portal called "MyChart".  Sign up information is provided on this After Visit Summary.  MyChart is used to connect with patients for Virtual Visits (Telemedicine).  Patients are able to view lab/test results, encounter notes, upcoming appointments, etc.  Non-urgent messages can be sent to your provider as well.   To learn more about what you can do with MyChart, go to [email or web address].    Your next appointment:   1 year(s)  Provider:   DR. Scharlene Gloss OR APP

## 2023-01-09 DIAGNOSIS — E1165 Type 2 diabetes mellitus with hyperglycemia: Secondary | ICD-10-CM | POA: Diagnosis not present

## 2023-01-10 ENCOUNTER — Ambulatory Visit: Payer: 59 | Admitting: Pharmacist

## 2023-01-10 DIAGNOSIS — E11649 Type 2 diabetes mellitus with hypoglycemia without coma: Secondary | ICD-10-CM | POA: Diagnosis not present

## 2023-01-10 NOTE — Progress Notes (Signed)
01/10/2023 Name: Cynthia Deleon MRN: [id number] DOB: 11/12/1955  Chief Complaint  Patient presents with   Diabetes    Cynthia Deleon is a 67 y.o. year old female who was referred for medication management by their primary care provider, Rakes, Doralee Albino, FNP. They presented for a face to face visit today.   They were referred to the pharmacist by their PCP for assistance in managing diabetes.  Patient was concerned about her A1c that increased to 6.4% from 6.0% and requested follow up with PharmD.  She reports she is doing well on Mounjaro 7.5mg .  Subjective:  Care Team: Primary Care Provider: Sonny Masters, FNP    Medication Access/Adherence  Current Pharmacy:  [facility] - [place], [place] - [phone] [place] [address] Phone: [phone] Fax: [phone]  [facility] - [place], [place] - [address]  [place] [address] Phone: [phone] Fax: [phone]   Patient reports affordability concerns with their medications: No  Patient reports access/transportation concerns to their pharmacy: No  Patient reports adherence concerns with their medications:  No     Diabetes:  Current medications: Mounjaro, glipizide Medications tried in the past: [place], trulicity, farxiga, metformin  Time in target range 86% GMI 6.3%   Patient denies hypoglycemic s/sx including dizziness, shakiness, sweating. Patient denies hyperglycemic symptoms including polyuria, polydipsia, polyphagia, nocturia, neuropathy, blurred vision.  Current meal patterns:  Discussed meal planning options and Plate method for healthy eating Avoid sugary drinks and desserts Incorporate balanced protein, non starchy veggies, 1 serving of carbohydrate with each meal Increase water intake Increase physical activity as able  Current physical activity: encouraged as able; dealing with shoulder and back pain  Current medication  access support: dual complete   Objective:  Lab Results  Component Value Date   HGBA1C 6.4 (H) 11/22/2022    Lab Results  Component Value Date   CREATININE 0.89 11/22/2022   BUN 11 11/22/2022   NA 141 11/22/2022   K 4.3 11/22/2022   CL 103 11/22/2022   CO2 24 11/22/2022    Lab Results  Component Value Date   CHOL 137 11/22/2022   HDL 46 11/22/2022   LDLCALC 66 11/22/2022   TRIG 141 11/22/2022   CHOLHDL 3.0 11/22/2022    Medications Reviewed Today     Reviewed by Danella Maiers, [facility] (Pharmacist) on 01/10/23 at 1440  Med List Status: <None>   Medication Order Taking? Sig Documenting Provider Last Dose Status Informant  albuterol (VENTOLIN HFA) 108 (90 Base) MCG/ACT inhaler [id number] No Inhale 2 puffs into the lungs every 6 (six) hours as needed. Coralyn Helling, MD Taking Active   ARIPiprazole (ABILIFY) 10 MG tablet [id number] No Take 10 mg by mouth every morning. [provider] Taking Active   Artificial Saliva (BIOTENE DRY MOUTH MOISTURIZING) SOLN [id number] No Use as directed 5 mLs in the mouth or throat in the morning and at bedtime. Sonny Masters, FNP Taking Active   aspirin EC 81 MG tablet [id number] No Take 81 mg by mouth daily. Swallow whole. [provider] Taking Active   atorvastatin (LIPITOR) 80 MG tablet [id number]  Take 1 tablet (80 mg total) by mouth daily. Sande Rives, MD  Active   cholecalciferol (VITAMIN D) 25 MCG tablet [id number] No Take 1 tablet (1,000 Units total) by mouth daily. Standley Brooking, MD Taking Active Self, Pharmacy Records  ciprofloxacin-dexamethasone [facility]) OTIC suspension [id number] No Place 4 drops into both  ears 2 (two) times daily. Arrie Senate, FNP Taking Active   Continuous Blood Gluc Sensor (FREESTYLE LIBRE 2 SENSOR) [place] [id number] No Apply to arm every 14 days to check blood sugar continuously. DX E11.65 Sonny Masters, FNP Taking Active   diazepam (VALIUM) 5 MG tablet [id number] No Take 5 mg by  mouth 2 (two) times daily. [provider] Taking Active   DULoxetine (CYMBALTA) 60 MG capsule [id number] No TAKE (1) CAPSULE BY MOUTH EVERY DAY. Levert Feinstein, MD Taking Active   estrogens, conjugated, (PREMARIN) 0.3 MG tablet [id number] No TAKE (1) TABLET BY MOUTH ONCE DAILY. Sonny Masters, FNP Taking Active   ezetimibe (ZETIA) 10 MG tablet [id number]  TAKE (1) TABLET BY MOUTH ONCE DAILY. Sande Rives, MD  Active   fluorometholone (FML) 0.1 % ophthalmic suspension [id number] No SMARTSIG:In Eye(s) [provider] Taking Active   fluticasone (FLONASE) 50 MCG/ACT nasal spray [id number] No USE 2 SPRAYS IN EACH NOSTRIL ONCE DAILY.  Patient taking differently: Place 2 sprays into both nostrils daily.   Gwenlyn Fudge, FNP Taking Active Self, Pharmacy Records  glipiZIDE (GLUCOTROL XL) 5 MG 24 hr tablet [id number]  TAKE (1) TABLET BY MOUTH TWICE DAILY WITH A MEAL. Sonny Masters, FNP  Active   insulin lispro (HUMALOG KWIKPEN) 100 UNIT/ML KwikPen [id number] No Use 5-10 units 2 times daily with breakfast and dinner when blood sugar is >200 on steroids Rakes, Doralee Albino, FNP Taking Active   Insulin Pen Needle (PIP PEN NEEDLES 32G X ) 32G X 4 MM MISC [id number] No Use to inject insulin 2 times daily as needed. DX E11.65 Sonny Masters, FNP Taking Active   lamoTRIgine (LAMICTAL) 200 MG tablet [id number] No Take 200 mg by mouth at bedtime. For mood disorder. [provider] Taking Active Self, Pharmacy Records  lansoprazole (PREVACID) 30 MG capsule [id number]  TAKE (1) CAPSULE BY MOUTH ONCE DAILY. Sonny Masters, FNP  Active   levocetirizine (XYZAL) 5 MG tablet [id number] No Take 1 tablet (5 mg total) by mouth every evening. Sonny Masters, FNP Taking Active   lisinopril (ZESTRIL) 5 MG tablet [id number] No TAKE (1) TABLET BY MOUTH ONCE DAILY. Sonny Masters, FNP Taking Active   meclizine (ANTIVERT) 25 MG tablet [id number] No Take 2 tablets (50 mg total) by mouth daily as needed for  dizziness. Gwenlyn Fudge, FNP Taking Active Self, Pharmacy Records  mirabegron ER [facility]) 50 MG TB24 tablet [id number] No Take 1 tablet (50 mg total) by mouth daily. Sonny Masters, FNP Taking Active   montelukast (SINGULAIR) 10 MG tablet [id number] No TAKE (1) TABLET BY MOUTH AT BEDTIME. Sonny Masters, FNP Taking Active   OneTouch Delica Lancets 30G MISC [id number] No Use to test blood sugar twice daily. DX: E11.9 Gwenlyn Fudge, FNP Taking Active Self, Pharmacy Records  oxyCODONE (ROXICODONE) 15 MG immediate release tablet [id number] No Take 1 tablet (15 mg total) by mouth every 6 (six) hours as needed for pain. Sherryll Burger, Pratik D, DO Taking Active   pregabalin (LYRICA) 150 MG capsule [id number]  TAKE 1 CAPSULE BY MOUTH THREE TIMES A DAY. Sonny Masters, FNP  Active   promethazine (PHENERGAN) 12.5 MG tablet [id number] No Take 1 tablet (12.5 mg total) by mouth every 8 (eight) hours as needed for nausea or vomiting. Sonny Masters, FNP Taking Active   tirzepatide [facility]) 7.5 MG/0.5ML Pen [id number] No Inject 7.5 mg into the skin once a week. DX: Y85.02 Sonny Masters, FNP  Taking Active   umeclidinium-vilanterol (ANORO ELLIPTA) 62.5-25 MCG/ACT AEPB [id number] No Inhale 1 puff into the lungs daily. Coralyn Helling, MD Taking Active   valACYclovir (VALTREX) 1000 MG tablet [id number] No Take 1 tablet (1,000 mg total) by mouth daily. Adline Potter, NP Taking Active   vitamin B-12 1000 MCG tablet [id number] No Take 1 tablet (1,000 mcg total) by mouth daily. Standley Brooking, MD Taking Active Self, Pharmacy Records           Med Note Mayford Knife, Dianna Limbo Nov 21, 2021  3:25 PM) Pt has not started yet  VRAYLAR 3 MG capsule [id number] No Take 3 mg by mouth at bedtime. [provider] Taking Active   Med List Note Sonny Masters, [place] 05/22/22 1546): Not Rx'd by PCP: sertraline, valium, lamotrigine, and oxycodone, lyrica- [facility] 12/02/19.              Assessment/Plan:   Diabetes: -  Currently controlled - Reviewed long term cardiovascular and renal outcomes of uncontrolled blood sugar - Reviewed goal A1c, goal fasting, and goal 2 hour post prandial glucose - Reviewed dietary modifications including FOLLOWING A HEART HEALTHY DIET/HEALTHY PLATE METHOD - Recommend to continue medications as prescribed; patient does report GI concerns with increasing Mounjaro (she experienced with higher doses of trulicity); will leave Mounjaro at 7.5mg  weekly and encourage patient to work on diet and lifestyle to maintain normoglycemia  - Recommend to check glucose using libre 2; make sure to scan at least every 8 hours to reduce gaps    Follow Up Plan: PCP in 02/2023;  6 weeks w/ PharmD  Kieth Brightly, PharmD, BCACP Clinical Pharmacist, [facility]

## 2023-01-14 ENCOUNTER — Ambulatory Visit: Payer: 59 | Admitting: Nurse Practitioner

## 2023-01-14 DIAGNOSIS — M5416 Radiculopathy, lumbar region: Secondary | ICD-10-CM | POA: Diagnosis not present

## 2023-01-17 ENCOUNTER — Encounter: Payer: Self-pay | Admitting: Family Medicine

## 2023-01-17 ENCOUNTER — Ambulatory Visit (INDEPENDENT_AMBULATORY_CARE_PROVIDER_SITE_OTHER): Payer: 59 | Admitting: Family Medicine

## 2023-01-17 VITALS — BP 115/60 | HR 100 | Temp 97.2°F | Resp 20 | Ht 69.0 in | Wt 166.5 lb

## 2023-01-17 DIAGNOSIS — Z8619 Personal history of other infectious and parasitic diseases: Secondary | ICD-10-CM

## 2023-01-17 DIAGNOSIS — J441 Chronic obstructive pulmonary disease with (acute) exacerbation: Secondary | ICD-10-CM

## 2023-01-17 MED ORDER — PREDNISONE 20 MG PO TABS
40.0000 mg | ORAL_TABLET | Freq: Every day | ORAL | 0 refills | Status: AC
Start: 2023-01-17 — End: 2023-01-22

## 2023-01-17 MED ORDER — DOXYCYCLINE HYCLATE 100 MG PO TABS: 100.0000 mg | ORAL_TABLET | Freq: Two times a day (BID) | ORAL | 0 refills | Status: AC

## 2023-01-17 MED ORDER — FLUCONAZOLE 150 MG PO TABS
150.0000 mg | ORAL_TABLET | Freq: Once | ORAL | 0 refills | Status: AC
Start: 2023-01-17 — End: 2023-01-17

## 2023-01-17 NOTE — Progress Notes (Signed)
Subjective:  [name], female    DOB: 09/07/1955, 67 y.o.   MRN: [id number]  Patient Care Team: Sonny Masters, FNP as PCP - General (Family Medicine) Sheran Luz, MD as Consulting Physician (Physical Medicine and Rehabilitation) Archer Asa, MD as Consulting Physician (Psychiatry) Beryle Beams, MD as Consulting Physician (Neurology) Danella Maiers, [facility] (Pharmacist) Glyn Ade, PA-C as Physician Assistant (Dermatology) Daisy Lazar, DO (Optometry)   Chief Complaint:  Cough   HPI: Cynthia Deleon is a 67 y.o. female presenting on 01/17/2023 for Cough   Cough This is a new problem. Episode onset: 10 days ago. The problem has been gradually worsening. The problem occurs every few minutes. The cough is Productive of purulent sputum. Associated symptoms include shortness of breath and wheezing. Pertinent negatives include no chest pain, chills, ear congestion, ear pain, fever, headaches, heartburn, hemoptysis, myalgias, nasal congestion, postnasal drip, rash, rhinorrhea, sore throat, sweats or weight loss. Exacerbated by: any activity. She has tried a beta-agonist inhaler and OTC cough suppressant for the symptoms. The treatment provided no relief. Her past medical history is significant for COPD.      Relevant past medical, surgical, family, and social history reviewed and updated as indicated.  Allergies and medications reviewed and updated. Data reviewed: Chart in Epic.   Past Medical History:  Diagnosis Date   Aortic atherosclerosis (HCC) 10/31/2021   Arthritis    hands and knees   Bipolar 1 disorder (HCC)    Borderline diabetic    Chronic back pain    Chronic neck pain    Complication of anesthesia    high anxiety-does not want to be alone   COPD (chronic obstructive pulmonary disease) (HCC)    Current use of estrogen therapy 01/12/2014   Depression    Diabetes mellitus without complication (HCC)    Diplopia    Family history of  adverse reaction to anesthesia    sister "gas in lungs"   GERD (gastroesophageal reflux disease)    Headache    Hepatic steatosis 10/31/2021   Hot flashes 12/15/2013   Hypertension    IBS (irritable bowel syndrome)    Incontinence    Kidney stone    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HCC)    Osteopenia 02/06/2022   Panic attacks    Peptic ulcer    Peripheral neuropathy    Rosacea    Shingles    Sleep apnea    uses a cpap-with oxygen    Past Surgical History:  Procedure Laterality Date   BIOPSY  04/27/2020   Procedure: BIOPSY;  Surgeon: Bernette Redbird, MD;  Location: [facility];  Service: Endoscopy;;   BUNIONECTOMY WITH HAMMERTOE RECONSTRUCTION Right 12/10/2012   Procedure: RIGHT FIRST METATARSAL CHEVRON BUNION CORRECTION,  2 AND 3 HAMMERTOE CORRECTION , RIGHT 3 AND 4 TOE NAIL EXCISION ;  Surgeon: Toni Arthurs, MD;  Location: [facility];  Service: Orthopedics;  Laterality: Right;   COLONOSCOPY WITH PROPOFOL N/A 04/27/2020   Procedure: COLONOSCOPY WITH PROPOFOL;  Surgeon: Bernette Redbird, MD;  Location: [facility];  Service: Endoscopy;  Laterality: N/A;   FOOT ARTHRODESIS  2000   both feet   LIPOSUCTION  03/2018   RECTAL SURGERY     TONSILLECTOMY     TOTAL ABDOMINAL HYSTERECTOMY      Social History   Socioeconomic History   Marital status: Divorced    Spouse name: Not on file   Number of children: 5   Years of education: 9th  Highest education level: Not on file  Occupational History   Occupation: Disabled  Tobacco Use   Smoking status: Former    Current packs/day: 0.00    Average packs/day: 1 pack/day for 42.0 years (42.0 ttl pk-yrs)    Types: Cigarettes    Start date: 06/11/1975    Quit date: 06/10/2017    Years since quitting: 5.6   Smokeless tobacco: Never  Vaping Use   Vaping status: Never Used  Substance and Sexual Activity   Alcohol use: No    Alcohol/week: 0.0 standard drinks of alcohol   Drug use: No   Sexual activity: Not Currently     Birth control/protection: Surgical    Comment: hyst  Other Topics Concern   Not on file  Social History Narrative   Lives at home with home with her son (has five children).   Right-handed.   1 cup caffeine per day.   Social Determinants of Health   Financial Resource Strain: Low Risk  (08/21/2022)   Overall Financial Resource Strain (CARDIA)    Difficulty of Paying Living Expenses: Not hard at all  Food Insecurity: No Food Insecurity (08/21/2022)   Hunger Vital Sign    Worried About Running Out of Food in the Last Year: Never true    Ran Out of Food in the Last Year: Never true  Transportation Needs: No Transportation Needs (08/21/2022)   PRAPARE - Administrator, Civil Service (Medical): No    Lack of Transportation (Non-Medical): No  Physical Activity: Inactive (08/21/2022)   Exercise Vital Sign    Days of Exercise per Week: 0 days    Minutes of Exercise per Session: 0 min  Stress: No Stress Concern Present (08/21/2022)   [employer] of Occupational Health - Occupational Stress Questionnaire    Feeling of Stress : Not at all  Social Connections: Moderately Integrated (08/21/2022)   Social Connection and Isolation Panel [NHANES]    Frequency of Communication with Friends and Family: More than three times a week    Frequency of Social Gatherings with Friends and Family: More than three times a week    Attends Religious Services: More than 4 times per year    Active Member of [employer] or Organizations: Yes    Attends Engineer, structural: More than 4 times per year    Marital Status: Divorced  Intimate Partner Violence: Not At Risk (08/21/2022)   Humiliation, Afraid, Rape, and Kick questionnaire    Fear of Current or Ex-Partner: No    Emotionally Abused: No    Physically Abused: No    Sexually Abused: No    Outpatient Encounter Medications as of 01/17/2023  Medication Sig   albuterol (VENTOLIN HFA) 108 (90 Base) MCG/ACT inhaler Inhale 2 puffs into the  lungs every 6 (six) hours as needed.   ARIPiprazole (ABILIFY) 10 MG tablet Take 10 mg by mouth every morning.   Artificial Saliva (BIOTENE DRY MOUTH MOISTURIZING) SOLN Use as directed 5 mLs in the mouth or throat in the morning and at bedtime.   aspirin EC 81 MG tablet Take 81 mg by mouth daily. Swallow whole.   atorvastatin (LIPITOR) 80 MG tablet Take 1 tablet (80 mg total) by mouth daily.   cholecalciferol (VITAMIN D) 25 MCG tablet Take 1 tablet (1,000 Units total) by mouth daily.   Continuous Blood Gluc Sensor (FREESTYLE LIBRE 2 SENSOR) MISC Apply to arm every 14 days to check blood sugar continuously. DX E11.65   diazepam (VALIUM) 5 MG  tablet Take 5 mg by mouth 2 (two) times daily.   doxycycline (VIBRA-TABS) 100 MG tablet Take 1 tablet (100 mg total) by mouth 2 (two) times daily for 10 days. 1 po bid   DULoxetine (CYMBALTA) 60 MG capsule TAKE (1) CAPSULE BY MOUTH EVERY DAY.   estrogens, conjugated, (PREMARIN) 0.3 MG tablet TAKE (1) TABLET BY MOUTH ONCE DAILY.   ezetimibe (ZETIA) 10 MG tablet TAKE (1) TABLET BY MOUTH ONCE DAILY.   fluconazole (DIFLUCAN) 150 MG tablet Take 1 tablet (150 mg total) by mouth once for 1 dose.   fluorometholone (FML) 0.1 % ophthalmic suspension SMARTSIG:In Eye(s)   fluticasone (FLONASE) 50 MCG/ACT nasal spray USE 2 SPRAYS IN EACH NOSTRIL ONCE DAILY. (Patient taking differently: Place 2 sprays into both nostrils daily.)   glipiZIDE (GLUCOTROL XL) 5 MG 24 hr tablet TAKE (1) TABLET BY MOUTH TWICE DAILY WITH A MEAL.   insulin lispro (HUMALOG KWIKPEN) 100 UNIT/ML KwikPen Use 5-10 units 2 times daily with breakfast and dinner when blood sugar is >200 on steroids   Insulin Pen Needle (PIP PEN NEEDLES 32G X ) 32G X 4 MM MISC Use to inject insulin 2 times daily as needed. DX E11.65   lamoTRIgine (LAMICTAL) 200 MG tablet Take 200 mg by mouth at bedtime. For mood disorder.   lansoprazole (PREVACID) 30 MG capsule TAKE (1) CAPSULE BY MOUTH ONCE DAILY.   levocetirizine (XYZAL)  5 MG tablet Take 1 tablet (5 mg total) by mouth every evening.   lisinopril (ZESTRIL) 5 MG tablet TAKE (1) TABLET BY MOUTH ONCE DAILY.   meclizine (ANTIVERT) 25 MG tablet Take 2 tablets (50 mg total) by mouth daily as needed for dizziness.   mirabegron ER (MYRBETRIQ) 50 MG TB24 tablet Take 1 tablet (50 mg total) by mouth daily.   montelukast (SINGULAIR) 10 MG tablet TAKE (1) TABLET BY MOUTH AT BEDTIME.   OneTouch Delica Lancets 30G MISC Use to test blood sugar twice daily. DX: E11.9   oxyCODONE (ROXICODONE) 15 MG immediate release tablet Take 1 tablet (15 mg total) by mouth every 6 (six) hours as needed for pain.   predniSONE (DELTASONE) 20 MG tablet Take 2 tablets (40 mg total) by mouth daily with breakfast for 5 days.   pregabalin (LYRICA) 150 MG capsule TAKE 1 CAPSULE BY MOUTH THREE TIMES A DAY.   promethazine (PHENERGAN) 12.5 MG tablet Take 1 tablet (12.5 mg total) by mouth every 8 (eight) hours as needed for nausea or vomiting.   tirzepatide (MOUNJARO) 7.5 MG/0.5ML Pen Inject 7.5 mg into the skin once a week. DX: E11.65   umeclidinium-vilanterol (ANORO ELLIPTA) 62.5-25 MCG/ACT AEPB Inhale 1 puff into the lungs daily.   valACYclovir (VALTREX) 1000 MG tablet Take 1 tablet (1,000 mg total) by mouth daily.   vitamin B-12 1000 MCG tablet Take 1 tablet (1,000 mcg total) by mouth daily.   VRAYLAR 3 MG capsule Take 3 mg by mouth at bedtime.   [DISCONTINUED] ciprofloxacin-dexamethasone (CIPRODEX) OTIC suspension Place 4 drops into both ears 2 (two) times daily.   No facility-administered encounter medications on file as of 01/17/2023.    Allergies  Allergen Reactions   Divalproex Sodium Other (See Comments)    Hallucinations  Other reaction(s): Confusion   Other Anaphylaxis and Other (See Comments)   Pseudoeph-Hydrocodone-Gg Nausea Only   Sulfa Antibiotics Anaphylaxis, Nausea Only, Other (See Comments) and Swelling    Swelling of tongue.   Valproic Acid Other (See Comments)   Varenicline  Other (See Comments)  Altered mental status-Per patient was put on allergy list by Dr Delbert Harness bu patient staes she is not allergic to this and is currently taking it.   Varenicline Tartrate Other (See Comments)    Other reaction(s): Confusion   Codeine Nausea And Vomiting and Other (See Comments)   Hydrocodone Nausea And Vomiting and Other (See Comments)   Ambien [Zolpidem] Other (See Comments)    Causes sleep walking   Clavulanic Acid Diarrhea   Elemental Sulfur Other (See Comments)    Swelling of tongue   Hydrocod Poli-Chlorphe Poli Er Other (See Comments)    Other reaction(s): Skin itches    Macrobid [Nitrofurantoin] Nausea And Vomiting    Dizziness, nausea, vomiting   Paxil [Paroxetine Hcl] Other (See Comments)    Causes ringing in the ears.    Prednisone Other (See Comments)    Other reaction(s): Unknown    Amoxicillin Rash    Has patient had a PCN reaction causing immediate rash, facial/tongue/throat swelling, SOB or lightheadedness with hypotension: No Has patient had a PCN reaction causing severe rash involving mucus membranes or skin necrosis: No Has patient had a PCN reaction that required hospitalization: No Has patient had a PCN reaction occurring within the last 10 years: Yes If all of the above answers are "NO", then may proceed with Cephalosporin use.    Farxiga [Dapagliflozin]     RECURRENT YEAST/UTI   Metformin And Related Diarrhea   Tape Rash    Review of Systems  Constitutional:  Positive for activity change and fatigue. Negative for appetite change, chills, diaphoresis, fever, unexpected weight change and weight loss.  HENT:  Negative for ear pain, postnasal drip, rhinorrhea and sore throat.   Eyes:  Negative for photophobia and visual disturbance.  Respiratory:  Positive for cough, shortness of breath and wheezing. Negative for apnea, hemoptysis, choking, chest tightness and stridor.   Cardiovascular:  Negative for chest pain, palpitations and leg  swelling.  Gastrointestinal:  Negative for abdominal pain and heartburn.  Endocrine: Negative for polydipsia, polyphagia and polyuria.  Genitourinary:  Negative for decreased urine volume and difficulty urinating.  Musculoskeletal:  Negative for myalgias.  Skin:  Negative for rash.  Neurological:  Negative for dizziness, tremors, seizures, syncope, facial asymmetry, speech difficulty, weakness, light-headedness, numbness and headaches.  Psychiatric/Behavioral:  Negative for confusion.   All other systems reviewed and are negative.       Objective:  BP 115/60   Pulse 100   Temp (!) 97.2 F (36.2 C) (Oral)   Resp 20   Ht 5\' 9"  (1.753 m)   Wt 166 lb 8 oz (75.5 kg)   SpO2 90%   BMI 24.59 kg/m    Wt Readings from Last 3 Encounters:  01/17/23 166 lb 8 oz (75.5 kg)  01/08/23 171 lb 6.4 oz (77.7 kg)  12/19/22 172 lb 9.6 oz (78.3 kg)    Physical Exam Vitals and nursing note reviewed.  Constitutional:      General: She is in acute distress.     Appearance: Normal appearance. She is not ill-appearing, toxic-appearing or diaphoretic.  HENT:     Head: Normocephalic and atraumatic.     Right Ear: Tympanic membrane, ear canal and external ear normal.     Left Ear: Tympanic membrane, ear canal and external ear normal.     Nose: Nose normal.     Mouth/Throat:     Mouth: Mucous membranes are moist.     Pharynx: No oropharyngeal exudate or posterior oropharyngeal erythema.  Eyes:     Conjunctiva/sclera: Conjunctivae normal.     Pupils: Pupils are equal, round, and reactive to light.  Cardiovascular:     Rate and Rhythm: Regular rhythm. Tachycardia present.     Heart sounds: Normal heart sounds.  Pulmonary:     Effort: Prolonged expiration present. No respiratory distress.     Breath sounds: No stridor. Decreased breath sounds, wheezing and rhonchi present. No rales.  Chest:     Chest wall: No tenderness.  Musculoskeletal:     Cervical back: Normal range of motion and neck supple.      Right lower leg: No edema.     Left lower leg: No edema.  Skin:    General: Skin is warm and dry.     Capillary Refill: Capillary refill takes less than 2 seconds.  Neurological:     General: No focal deficit present.     Mental Status: She is alert and oriented to person, place, and time.  Psychiatric:        Mood and Affect: Mood normal.        Behavior: Behavior normal.        Thought Content: Thought content normal.        Judgment: Judgment normal.     Results for orders placed or performed in visit on 12/31/22  HM DIABETES EYE EXAM  Result Value Ref Range   HM Diabetic Eye Exam No Retinopathy No Retinopathy       Pertinent labs & imaging results that were available during my care of the patient were reviewed by me and considered in my medical decision making.  Assessment & Plan:  Yarenis was seen today for cough.  Diagnoses and all orders for this visit:  Acute exacerbation of chronic obstructive pulmonary disease (COPD) (HCC) Will treat with below. Agrees to prednisone, states she will just have trouble sleeping when taking. Aware to continue Mucinex with plenty of water. Aware to continue SABA and regular controller inhalers as prescribed. Aware to report new, worsening, or persistent symptoms. Aware to follow up in 1 week for reevaluation, sooner if warranted. Aware if symptoms worsen, go to the ED.  -     doxycycline (VIBRA-TABS) 100 MG tablet; Take 1 tablet (100 mg total) by mouth 2 (two) times daily for 10 days. 1 po bid -     predniSONE (DELTASONE) 20 MG tablet; Take 2 tablets (40 mg total) by mouth daily with breakfast for 5 days.  History of candidiasis -     fluconazole (DIFLUCAN) 150 MG tablet; Take 1 tablet (150 mg total) by mouth once for 1 dose.     Continue all other maintenance medications.  Follow up plan: Return in about 1 week (around 01/24/2023), or if symptoms worsen or fail to improve, for COPD.   Continue healthy lifestyle choices,  including diet (rich in fruits, vegetables, and lean proteins, and low in salt and simple carbohydrates) and exercise (at least 30 minutes of moderate physical activity daily).  Educational handout given for COPD exacerbation  The above assessment and management plan was discussed with the patient. The patient verbalized understanding of and has agreed to the management plan. Patient is aware to call the clinic if they develop any new symptoms or if symptoms persist or worsen. Patient is aware when to return to the clinic for a follow-up visit. Patient educated on when it is appropriate to go to the emergency department.   Kari Baars, FNP-C [facility] [phone]

## 2023-01-20 ENCOUNTER — Ambulatory Visit (HOSPITAL_COMMUNITY): Payer: 59

## 2023-01-20 ENCOUNTER — Other Ambulatory Visit: Payer: Self-pay | Admitting: Family Medicine

## 2023-01-20 DIAGNOSIS — J441 Chronic obstructive pulmonary disease with (acute) exacerbation: Secondary | ICD-10-CM

## 2023-01-31 DIAGNOSIS — G4733 Obstructive sleep apnea (adult) (pediatric): Secondary | ICD-10-CM | POA: Diagnosis not present

## 2023-02-01 ENCOUNTER — Encounter (HOSPITAL_COMMUNITY): Payer: Self-pay

## 2023-02-01 ENCOUNTER — Ambulatory Visit (HOSPITAL_COMMUNITY)
Admission: RE | Admit: 2023-02-01 | Discharge: 2023-02-01 | Disposition: A | Discharge status: Home / Self Care | Payer: 59 | Source: Ambulatory Visit | Attending: Orthopedic Surgery | Admitting: Orthopedic Surgery

## 2023-02-01 DIAGNOSIS — M25511 Pain in right shoulder: Secondary | ICD-10-CM

## 2023-02-04 ENCOUNTER — Ambulatory Visit (HOSPITAL_COMMUNITY)
Admission: RE | Admit: 2023-02-04 | Discharge: 2023-02-04 | Disposition: A | Discharge status: Home / Self Care | Payer: 59 | Source: Ambulatory Visit | Attending: Orthopedic Surgery | Admitting: Orthopedic Surgery

## 2023-02-04 DIAGNOSIS — M25511 Pain in right shoulder: Secondary | ICD-10-CM | POA: Diagnosis not present

## 2023-02-04 DIAGNOSIS — M7581 Other shoulder lesions, right shoulder: Secondary | ICD-10-CM | POA: Diagnosis not present

## 2023-02-04 DIAGNOSIS — S46811A Strain of other muscles, fascia and tendons at shoulder and upper arm level, right arm, initial encounter: Secondary | ICD-10-CM | POA: Diagnosis not present

## 2023-02-04 DIAGNOSIS — M25411 Effusion, right shoulder: Secondary | ICD-10-CM | POA: Diagnosis not present

## 2023-02-04 DIAGNOSIS — M129 Arthropathy, unspecified: Secondary | ICD-10-CM | POA: Diagnosis not present

## 2023-02-06 ENCOUNTER — Other Ambulatory Visit: Payer: Self-pay | Admitting: Family Medicine

## 2023-02-06 DIAGNOSIS — J309 Allergic rhinitis, unspecified: Secondary | ICD-10-CM

## 2023-02-07 ENCOUNTER — Ambulatory Visit (HOSPITAL_COMMUNITY): Payer: 59

## 2023-02-11 ENCOUNTER — Ambulatory Visit (HOSPITAL_BASED_OUTPATIENT_CLINIC_OR_DEPARTMENT_OTHER): Payer: 59 | Admitting: Pulmonary Disease

## 2023-02-25 ENCOUNTER — Ambulatory Visit (INDEPENDENT_AMBULATORY_CARE_PROVIDER_SITE_OTHER): Payer: 59 | Admitting: Family Medicine

## 2023-02-25 ENCOUNTER — Encounter: Payer: Self-pay | Admitting: Family Medicine

## 2023-02-25 VITALS — BP 112/63 | HR 88 | Temp 97.7°F | Ht 69.0 in | Wt 166.8 lb

## 2023-02-25 DIAGNOSIS — E11649 Type 2 diabetes mellitus with hypoglycemia without coma: Secondary | ICD-10-CM

## 2023-02-25 DIAGNOSIS — Z7984 Long term (current) use of oral hypoglycemic drugs: Secondary | ICD-10-CM

## 2023-02-25 DIAGNOSIS — E1142 Type 2 diabetes mellitus with diabetic polyneuropathy: Secondary | ICD-10-CM | POA: Diagnosis not present

## 2023-02-25 DIAGNOSIS — N3090 Cystitis, unspecified without hematuria: Secondary | ICD-10-CM | POA: Diagnosis not present

## 2023-02-25 DIAGNOSIS — R300 Dysuria: Secondary | ICD-10-CM | POA: Diagnosis not present

## 2023-02-25 LAB — MICROSCOPIC EXAMINATION
RBC, Urine: NONE SEEN /HPF (ref 0–2)
Renal Epithel, UA: NONE SEEN /HPF
Yeast, UA: NONE SEEN

## 2023-02-25 LAB — URINALYSIS, ROUTINE W REFLEX MICROSCOPIC
Bilirubin, UA: NEGATIVE
Glucose, UA: NEGATIVE
Leukocytes,UA: NEGATIVE
Nitrite, UA: NEGATIVE
Protein,UA: NEGATIVE
RBC, UA: NEGATIVE
Specific Gravity, UA: 1.025 (ref 1.005–1.030)
Urobilinogen, Ur: 0.2 mg/dL (ref 0.2–1.0)
pH, UA: 5.5 (ref 5.0–7.5)

## 2023-02-25 LAB — BAYER DCA HB A1C WAIVED: HB A1C (BAYER DCA - WAIVED): 6.3 % — ABNORMAL HIGH (ref 4.8–5.6)

## 2023-02-25 MED ORDER — CEPHALEXIN 500 MG PO CAPS
500.0000 mg | ORAL_CAPSULE | Freq: Two times a day (BID) | ORAL | 0 refills | Status: AC
Start: 2023-02-25 — End: 2023-03-04

## 2023-02-25 MED ORDER — TIRZEPATIDE 10 MG/0.5ML ~~LOC~~ SOAJ: 10.0000 mg | SUBCUTANEOUS | 3 refills | Status: DC

## 2023-02-25 NOTE — Progress Notes (Signed)
Subjective:  [name], female    DOB: [birth year], 67 y.o.   MRN: [id number]  Patient Care Team: Sonny Masters, FNP as PCP - General (Family Medicine) Sheran Luz, MD as Consulting Physician (Physical Medicine and Rehabilitation) Archer Asa, MD as Consulting Physician (Psychiatry) Beryle Beams, MD as Consulting Physician (Neurology) Danella Maiers, [facility] (Pharmacist) Glyn Ade, PA-C as Physician Assistant (Dermatology) Daisy Lazar, DO (Optometry)   Chief Complaint:  Diabetes, Nephropathy (Would like medication increased ), and Dysuria (X 3 days )   HPI: Cynthia Deleon is a 67 y.o. female presenting on 02/25/2023 for Diabetes, Nephropathy (Would like medication increased ), and Dysuria (X 3 days )    1. Type 2 diabetes mellitus with hypoglycemia without coma, without long-term current use of insulin (HCC) Has been taking her medications as prescribe and is tolerating well. No side effects from medications. No polyuria, polyphagia, or polydipsia.   2. Dysuria States started 2 days ago and is worsening. No hematuria or vaginal bleeding or discharge.   3. Polyneuropathy due to type 2 diabetes mellitus (HCC) She is on lyrica but reports she thinks she is getting gabapentin from the pharmacy. Pt aware to check on this as she is prescribed Lyrica. States she feels her neuropathy might be worsening.      Relevant past medical, surgical, family, and social history reviewed and updated as indicated.  Allergies and medications reviewed and updated. Data reviewed: Chart in Epic.   Past Medical History:  Diagnosis Date   Aortic atherosclerosis (HCC) 10/31/2021   Arthritis    hands and knees   Bipolar 1 disorder (HCC)    Borderline diabetic    Chronic back pain    Chronic neck pain    Complication of anesthesia    high anxiety-does not want to be alone   COPD (chronic obstructive pulmonary disease) (HCC)    Current use of estrogen  therapy 01/12/2014   Depression    Diabetes mellitus without complication (HCC)    Diplopia    Family history of adverse reaction to anesthesia    sister "gas in lungs"   GERD (gastroesophageal reflux disease)    Headache    Hepatic steatosis 10/31/2021   Hot flashes 12/15/2013   Hypertension    IBS (irritable bowel syndrome)    Incontinence    Kidney stone    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HCC)    Osteopenia 02/06/2022   Panic attacks    Peptic ulcer    Peripheral neuropathy    Rosacea    Shingles    Sleep apnea    uses a cpap-with oxygen    Past Surgical History:  Procedure Laterality Date   BIOPSY  04/27/2020   Procedure: BIOPSY;  Surgeon: Bernette Redbird, MD;  Location: [facility];  Service: Endoscopy;;   BUNIONECTOMY WITH HAMMERTOE RECONSTRUCTION Right 12/10/2012   Procedure: RIGHT FIRST METATARSAL CHEVRON BUNION CORRECTION,  2 AND 3 HAMMERTOE CORRECTION , RIGHT 3 AND 4 TOE NAIL EXCISION ;  Surgeon: Toni Arthurs, MD;  Location: [facility];  Service: Orthopedics;  Laterality: Right;   COLONOSCOPY WITH PROPOFOL N/A 04/27/2020   Procedure: COLONOSCOPY WITH PROPOFOL;  Surgeon: Bernette Redbird, MD;  Location: [facility];  Service: Endoscopy;  Laterality: N/A;   FOOT ARTHRODESIS  2000   both feet   LIPOSUCTION  03/2018   RECTAL SURGERY     TONSILLECTOMY     TOTAL ABDOMINAL HYSTERECTOMY      Social  History   Socioeconomic History   Marital status: Divorced    Spouse name: Not on file   Number of children: 5   Years of education: 9th   Highest education level: Not on file  Occupational History   Occupation: Disabled  Tobacco Use   Smoking status: Former    Current packs/day: 0.00    Average packs/day: 1 pack/day for 42.0 years (42.0 ttl pk-yrs)    Types: Cigarettes    Start date: 06/11/1975    Quit date: 06/10/2017    Years since quitting: 5.7   Smokeless tobacco: Never  Vaping Use   Vaping status: Never Used  Substance and Sexual Activity    Alcohol use: No    Alcohol/week: 0.0 standard drinks of alcohol   Drug use: No   Sexual activity: Not Currently    Birth control/protection: Surgical    Comment: hyst  Other Topics Concern   Not on file  Social History Narrative   Lives at home with home with her son (has five children).   Right-handed.   1 cup caffeine per day.   Social Determinants of Health   Financial Resource Strain: Low Risk  (08/21/2022)   Overall Financial Resource Strain (CARDIA)    Difficulty of Paying Living Expenses: Not hard at all  Food Insecurity: No Food Insecurity (08/21/2022)   Hunger Vital Sign    Worried About Running Out of Food in the Last Year: Never true    Ran Out of Food in the Last Year: Never true  Transportation Needs: No Transportation Needs (08/21/2022)   PRAPARE - Administrator, Civil Service (Medical): No    Lack of Transportation (Non-Medical): No  Physical Activity: Inactive (08/21/2022)   Exercise Vital Sign    Days of Exercise per Week: 0 days    Minutes of Exercise per Session: 0 min  Stress: No Stress Concern Present (08/21/2022)   [employer] of Occupational Health - Occupational Stress Questionnaire    Feeling of Stress : Not at all  Social Connections: Moderately Integrated (08/21/2022)   Social Connection and Isolation Panel [NHANES]    Frequency of Communication with Friends and Family: More than three times a week    Frequency of Social Gatherings with Friends and Family: More than three times a week    Attends Religious Services: More than 4 times per year    Active Member of [employer] or Organizations: Yes    Attends Banker Meetings: More than 4 times per year    Marital Status: Divorced  Intimate Partner Violence: Not At Risk (08/21/2022)   Humiliation, Afraid, Rape, and Kick questionnaire    Fear of Current or Ex-Partner: No    Emotionally Abused: No    Physically Abused: No    Sexually Abused: No    Outpatient Encounter Medications  as of 02/25/2023  Medication Sig   albuterol (VENTOLIN HFA) 108 (90 Base) MCG/ACT inhaler INHALE 2PUFFS EVERY 6 HOURS AS NEEDED.   ARIPiprazole (ABILIFY) 10 MG tablet Take 10 mg by mouth every morning.   Artificial Saliva (BIOTENE DRY MOUTH MOISTURIZING) SOLN Use as directed 5 mLs in the mouth or throat in the morning and at bedtime.   aspirin EC 81 MG tablet Take 81 mg by mouth daily. Swallow whole.   atorvastatin (LIPITOR) 80 MG tablet Take 1 tablet (80 mg total) by mouth daily.   cephALEXin (KEFLEX) 500 MG capsule Take 1 capsule (500 mg total) by mouth 2 (two) times daily for  7 days.   cholecalciferol (VITAMIN D) 25 MCG tablet Take 1 tablet (1,000 Units total) by mouth daily.   Continuous Blood Gluc Sensor (FREESTYLE LIBRE 2 SENSOR) MISC Apply to arm every 14 days to check blood sugar continuously. DX E11.65   diazepam (VALIUM) 5 MG tablet Take 5 mg by mouth 2 (two) times daily.   DULoxetine (CYMBALTA) 60 MG capsule TAKE (1) CAPSULE BY MOUTH EVERY DAY.   estrogens, conjugated, (PREMARIN) 0.3 MG tablet TAKE (1) TABLET BY MOUTH ONCE DAILY.   ezetimibe (ZETIA) 10 MG tablet TAKE (1) TABLET BY MOUTH ONCE DAILY.   fluorometholone (FML) 0.1 % ophthalmic suspension SMARTSIG:In Eye(s)   fluticasone (FLONASE) 50 MCG/ACT nasal spray USE 2 SPRAYS IN EACH NOSTRIL ONCE DAILY. (Patient taking differently: Place 2 sprays into both nostrils daily.)   glipiZIDE (GLUCOTROL XL) 5 MG 24 hr tablet TAKE (1) TABLET BY MOUTH TWICE DAILY WITH A MEAL.   insulin lispro (HUMALOG KWIKPEN) 100 UNIT/ML KwikPen Use 5-10 units 2 times daily with breakfast and dinner when blood sugar is >200 on steroids   Insulin Pen Needle (PIP PEN NEEDLES 32G X ) 32G X 4 MM MISC Use to inject insulin 2 times daily as needed. DX E11.65   lamoTRIgine (LAMICTAL) 200 MG tablet Take 200 mg by mouth at bedtime. For mood disorder.   lansoprazole (PREVACID) 30 MG capsule TAKE (1) CAPSULE BY MOUTH ONCE DAILY.   levocetirizine (XYZAL) 5 MG tablet  Take 1 tablet (5 mg total) by mouth every evening.   lisinopril (ZESTRIL) 5 MG tablet TAKE (1) TABLET BY MOUTH ONCE DAILY.   meclizine (ANTIVERT) 25 MG tablet Take 2 tablets (50 mg total) by mouth daily as needed for dizziness.   mirabegron ER (MYRBETRIQ) 50 MG TB24 tablet Take 1 tablet (50 mg total) by mouth daily.   montelukast (SINGULAIR) 10 MG tablet TAKE (1) TABLET BY MOUTH AT BEDTIME.   OneTouch Delica Lancets 30G MISC Use to test blood sugar twice daily. DX: E11.9   oxyCODONE (ROXICODONE) 15 MG immediate release tablet Take 1 tablet (15 mg total) by mouth every 6 (six) hours as needed for pain.   pregabalin (LYRICA) 150 MG capsule TAKE 1 CAPSULE BY MOUTH THREE TIMES A DAY.   promethazine (PHENERGAN) 12.5 MG tablet Take 1 tablet (12.5 mg total) by mouth every 8 (eight) hours as needed for nausea or vomiting.   tirzepatide (MOUNJARO) 10 MG/0.5ML Pen Inject 10 mg into the skin once a week.   umeclidinium-vilanterol (ANORO ELLIPTA) 62.5-25 MCG/ACT AEPB Inhale 1 puff into the lungs daily.   valACYclovir (VALTREX) 1000 MG tablet Take 1 tablet (1,000 mg total) by mouth daily.   vitamin B-12 1000 MCG tablet Take 1 tablet (1,000 mcg total) by mouth daily.   VRAYLAR 3 MG capsule Take 3 mg by mouth at bedtime.   [DISCONTINUED] tirzepatide (MOUNJARO) 7.5 MG/0.5ML Pen Inject 7.5 mg into the skin once a week. DX: E11.65   No facility-administered encounter medications on file as of 02/25/2023.    Allergies  Allergen Reactions   Divalproex Sodium Other (See Comments)    Hallucinations   Other reaction(s): Confusion  Hallucinations     Other reaction(s): Confusion    Hallucinations   Other Anaphylaxis, Other (See Comments), Nausea And Vomiting and Nausea Only   Pseudoeph-Hydrocodone-Gg Nausea Only   Sulfa Antibiotics Anaphylaxis, Nausea Only, Other (See Comments) and Swelling    Swelling of tongue.   Valproic Acid Other (See Comments)   Varenicline Other (See Comments)  Altered mental  status-Per patient was put on allergy list by Dr Delbert Harness bu patient staes she is not allergic to this and is currently taking it.   Varenicline Tartrate Other (See Comments)    Other reaction(s): Confusion   Codeine Nausea And Vomiting and Other (See Comments)    Other reaction(s): vomiting   Hydrocodone Nausea And Vomiting and Other (See Comments)   Ambien [Zolpidem] Other (See Comments)    Causes sleep walking   Clavulanic Acid Diarrhea   Elemental Sulfur Other (See Comments)    Swelling of tongue   Hydrocod Poli-Chlorphe Poli Er Other (See Comments)    Other reaction(s): Skin itches    Macrobid [Nitrofurantoin] Nausea And Vomiting    Dizziness, nausea, vomiting   Paxil [Paroxetine Hcl] Other (See Comments)    Causes ringing in the ears.    Prednisone Other (See Comments)    Other reaction(s): Unknown    Amoxicillin Rash    Has patient had a PCN reaction causing immediate rash, facial/tongue/throat swelling, SOB or lightheadedness with hypotension: No Has patient had a PCN reaction causing severe rash involving mucus membranes or skin necrosis: No Has patient had a PCN reaction that required hospitalization: No Has patient had a PCN reaction occurring within the last 10 years: Yes If all of the above answers are "NO", then may proceed with Cephalosporin use.    [place] [Dapagliflozin]     RECURRENT YEAST/UTI   Metformin And Related Diarrhea   Tape Rash    Review of Systems  Constitutional:  Negative for activity change, appetite change, chills, diaphoresis, fatigue, fever and unexpected weight change.  HENT: Negative.    Eyes: Negative.  Negative for photophobia and visual disturbance.  Respiratory:  Negative for cough, chest tightness and shortness of breath.   Cardiovascular:  Negative for chest pain, palpitations and leg swelling.  Gastrointestinal:  Negative for abdominal pain, blood in stool, constipation, diarrhea, nausea and vomiting.  Endocrine: Negative.   Negative for polydipsia, polyphagia and polyuria.  Genitourinary:  Positive for dysuria and frequency. Negative for decreased urine volume, difficulty urinating, dyspareunia, enuresis, flank pain, genital sores, hematuria, menstrual problem, pelvic pain, urgency, vaginal bleeding, vaginal discharge and vaginal pain.  Musculoskeletal:  Positive for arthralgias and myalgias.  Skin: Negative.   Allergic/Immunologic: Negative.   Neurological:  Positive for numbness. Negative for dizziness, tremors, seizures, syncope, facial asymmetry, speech difficulty, weakness, light-headedness and headaches.  Hematological: Negative.   Psychiatric/Behavioral:  Negative for confusion, hallucinations, sleep disturbance and suicidal ideas.   All other systems reviewed and are negative.       Objective:  BP 112/63   Pulse 88   Temp 97.7 F (36.5 C) (Temporal)   Ht 5\' 9"  (1.753 m)   Wt 166 lb 12.8 oz (75.7 kg)   SpO2 92%   BMI 24.63 kg/m    Wt Readings from Last 3 Encounters:  02/25/23 166 lb 12.8 oz (75.7 kg)  01/17/23 166 lb 8 oz (75.5 kg)  01/08/23 171 lb 6.4 oz (77.7 kg)    Physical Exam Vitals and nursing note reviewed.  Constitutional:      General: She is not in acute distress.    Appearance: Normal appearance. She is well-developed and well-groomed. She is not ill-appearing, toxic-appearing or diaphoretic.  HENT:     Head: Normocephalic and atraumatic.     Jaw: There is normal jaw occlusion.     Right Ear: Hearing normal.     Left Ear: Hearing normal.  Nose: Nose normal.     Mouth/Throat:     Lips: Pink.     Mouth: Mucous membranes are moist.     Pharynx: Oropharynx is clear. Uvula midline.  Eyes:     General: Lids are normal.     Extraocular Movements: Extraocular movements intact.     Conjunctiva/sclera: Conjunctivae normal.     Pupils: Pupils are equal, round, and reactive to light.  Neck:     Thyroid: No thyroid mass, thyromegaly or thyroid tenderness.     Vascular: No  carotid bruit or JVD.     Trachea: Trachea and phonation normal.  Cardiovascular:     Rate and Rhythm: Normal rate and regular rhythm.     Chest Wall: PMI is not displaced.     Pulses: Normal pulses.     Heart sounds: Normal heart sounds. No murmur heard.    No friction rub. No gallop.  Pulmonary:     Effort: Pulmonary effort is normal. No respiratory distress.     Breath sounds: Normal breath sounds. No wheezing.  Abdominal:     General: Bowel sounds are normal. There is no distension or abdominal bruit.     Palpations: Abdomen is soft. There is no hepatomegaly or splenomegaly.     Tenderness: There is no abdominal tenderness. There is no right CVA tenderness or left CVA tenderness.     Hernia: No hernia is present.  Musculoskeletal:        General: Normal range of motion.     Cervical back: Normal range of motion and neck supple.     Right lower leg: No edema.     Left lower leg: No edema.  Lymphadenopathy:     Cervical: No cervical adenopathy.  Skin:    General: Skin is warm and dry.     Capillary Refill: Capillary refill takes less than 2 seconds.     Coloration: Skin is not cyanotic, jaundiced or pale.     Findings: No rash.  Neurological:     General: No focal deficit present.     Mental Status: She is alert and oriented to person, place, and time.     Sensory: Sensation is intact.     Motor: Motor function is intact.     Coordination: Coordination is intact.     Gait: Gait is intact.     Deep Tendon Reflexes: Reflexes are normal and symmetric.  Psychiatric:        Attention and Perception: Attention and perception normal.        Mood and Affect: Mood and affect normal.        Speech: Speech normal.        Behavior: Behavior normal. Behavior is cooperative.        Thought Content: Thought content normal.        Cognition and Memory: Cognition and memory normal.        Judgment: Judgment normal.     Results for orders placed or performed in visit on 12/31/22  HM  DIABETES EYE EXAM  Result Value Ref Range   HM Diabetic Eye Exam No Retinopathy No Retinopathy       Pertinent labs & imaging results that were available during my care of the patient were reviewed by me and considered in my medical decision making.  Assessment & Plan:  Kazandra was seen today for diabetes, nephropathy and dysuria.  Diagnoses and all orders for this visit:  Type 2 diabetes mellitus with hypoglycemia without coma, without  long-term current use of insulin (HCC) Will increase Mounjaro to 10 mg weekly as pt is tolerating well. Labs pending. Diet and exercise encourage.  -     Bayer DCA Hb A1c Waived -     Microalbumin / creatinine urine ratio -     tirzepatide (MOUNJARO) 10 MG/0.5ML Pen; Inject 10 mg into the skin once a week.  Dysuria Concerning for acute cystitis. Culture added.  -     Urinalysis, Routine w reflex microscopic -     Urine Culture  Cystitis Will start below, culture pending. Will change therapy if warranted.  -     cephALEXin (KEFLEX) 500 MG capsule; Take 1 capsule (500 mg total) by mouth 2 (two) times daily for 7 days.  Polyneuropathy due to type 2 diabetes mellitus (HCC) Worsening per pt. Will place referral to neurology. Aware to check medications as she is prescribed. Lyrica not gabapentin.  -     Ambulatory referral to Neurology     Continue all other maintenance medications.  Follow up plan: Return in about 3 months (around 05/27/2023), or if symptoms worsen or fail to improve, for DM.   Continue healthy lifestyle choices, including diet (rich in fruits, vegetables, and lean proteins, and low in salt and simple carbohydrates) and exercise (at least 30 minutes of moderate physical activity daily).  Educational handout given for DM  The above assessment and management plan was discussed with the patient. The patient verbalized understanding of and has agreed to the management plan. Patient is aware to call the clinic if they develop any new  symptoms or if symptoms persist or worsen. Patient is aware when to return to the clinic for a follow-up visit. Patient educated on when it is appropriate to go to the emergency department.   Kari Baars, FNP-C [facility] [phone]

## 2023-02-25 NOTE — Patient Instructions (Addendum)
[facility] or Olly Brain     Continue to monitor your blood sugars as we discussed and record them. Bring the log to your next appointment.  Take your medications as directed.    Goal Blood glucose:    Fasting (before meals) = 80 to 130   Within 2 hours of eating = less than 180   Understanding your Hemoglobin A1c: 6.3     Diabetes Mellitus and Nutrition    I think that you would greatly benefit from seeing a nutritionist. If this is something you are interested in, please call Dr Gerilyn Pilgrim at [phone] to schedule an appointment.   When you have diabetes (diabetes mellitus), it is very important to have healthy eating habits because your blood sugar (glucose) levels are greatly affected by what you eat and drink. Eating healthy foods in the appropriate amounts, at about the same times every day, can help you: Control your blood glucose. Lower your risk of heart disease. Improve your blood pressure. Reach or maintain a healthy weight.  Every person with diabetes is different, and each person has different needs for a meal plan. Your health care provider may recommend that you work with a diet and nutrition specialist (dietitian) to make a meal plan that is best for you. Your meal plan may vary depending on factors such as: The calories you need. The medicines you take. Your weight. Your blood glucose, blood pressure, and cholesterol levels. Your activity level. Other health conditions you have, such as heart or kidney disease.  How do carbohydrates affect me? Carbohydrates affect your blood glucose level more than any other type of food. Eating carbohydrates naturally increases the amount of glucose in your blood. Carbohydrate counting is a method for keeping track of how many carbohydrates you eat. Counting carbohydrates is important to keep your blood glucose at a healthy level, especially if you use insulin or take certain oral diabetes medicines. It is important to know how  many carbohydrates you can safely have in each meal. This is different for every person. Your dietitian can help you calculate how many carbohydrates you should have at each meal and for snack. Foods that contain carbohydrates include: Bread, cereal, rice, pasta, and crackers. Potatoes and corn. Peas, beans, and lentils. Milk and yogurt. Fruit and juice. Desserts, such as cakes, cookies, ice cream, and candy.  How does alcohol affect me? Alcohol can cause a sudden decrease in blood glucose (hypoglycemia), especially if you use insulin or take certain oral diabetes medicines. Hypoglycemia can be a life-threatening condition. Symptoms of hypoglycemia (sleepiness, dizziness, and confusion) are similar to symptoms of having too much alcohol. If your health care provider says that alcohol is safe for you, follow these guidelines: Limit alcohol intake to no more than 1 drink per day for nonpregnant women and 2 drinks per day for men. One drink equals 12 oz of beer, 5 oz of wine, or 1 oz of hard liquor. Do not drink on an empty stomach. Keep yourself hydrated with water, diet soda, or unsweetened iced tea. Keep in mind that regular soda, juice, and other mixers may contain a lot of sugar and must be counted as carbohydrates.  What are tips for following this plan?  Reading food labels Start by checking the serving size on the label. The amount of calories, carbohydrates, fats, and other nutrients listed on the label are based on one serving of the food. Many foods contain more than one serving per package. Check the total grams (g)  of carbohydrates in one serving. You can calculate the number of servings of carbohydrates in one serving by dividing the total carbohydrates by 15. For example, if a food has 30 g of total carbohydrates, it would be equal to 2 servings of carbohydrates. Check the number of grams (g) of saturated and trans fats in one serving. Choose foods that have low or no amount of  these fats. Check the number of milligrams (mg) of sodium in one serving. Most people should limit total sodium intake to less than 2,300 mg per day. Always check the nutrition information of foods labeled as "low-fat" or "nonfat". These foods may be higher in added sugar or refined carbohydrates and should be avoided. Talk to your dietitian to identify your daily goals for nutrients listed on the label.  Shopping Avoid buying canned, premade, or processed foods. These foods tend to be high in fat, sodium, and added sugar. Shop around the outside edge of the grocery store. This includes fresh fruits and vegetables, bulk grains, fresh meats, and fresh dairy.  Cooking Use low-heat cooking methods, such as baking, instead of high-heat cooking methods like deep frying. Cook using healthy oils, such as olive, canola, or sunflower oil. Avoid cooking with butter, cream, or high-fat meats.  Meal planning Eat meals and snacks regularly, preferably at the same times every day. Avoid going long periods of time without eating. Eat foods high in fiber, such as fresh fruits, vegetables, beans, and whole grains. Talk to your dietitian about how many servings of carbohydrates you can eat at each meal. Eat 4-6 ounces of lean protein each day, such as lean meat, chicken, fish, eggs, or tofu. 1 ounce is equal to 1 ounce of meat, chicken, or fish, 1 egg, or 1/4 cup of tofu. Eat some foods each day that contain healthy fats, such as avocado, nuts, seeds, and fish.  Lifestyle  Check your blood glucose regularly. Exercise at least 30 minutes 5 or more days each week, or as told by your health care provider. Take medicines as told by your health care provider. Do not use any products that contain nicotine or tobacco, such as cigarettes and e-cigarettes. If you need help quitting, ask your health care provider. Work with a Veterinary surgeon or diabetes educator to identify strategies to manage stress and any emotional and  social challenges.  What are some questions to ask my health care provider? Do I need to meet with a diabetes educator? Do I need to meet with a dietitian? What number can I call if I have questions? When are the best times to check my blood glucose?  Where to find more information: American Diabetes Association: diabetes.org/food-and-fitness/food Academy of Nutrition and Dietetics: https://www.vargas.com/ [employer] of Diabetes and Digestive and Kidney Diseases (NIH): FindJewelers.cz  Summary A healthy meal plan will help you control your blood glucose and maintain a healthy lifestyle. Working with a diet and nutrition specialist (dietitian) can help you make a meal plan that is best for you. Keep in mind that carbohydrates and alcohol have immediate effects on your blood glucose levels. It is important to count carbohydrates and to use alcohol carefully. This information is not intended to replace advice given to you by your health care provider. Make sure you discuss any questions you have with your health care provider. Document Released: 02/21/2005 Document Revised: 07/01/2016 Document Reviewed: 07/01/2016 Elsevier Interactive Patient Education  [employer].

## 2023-02-27 ENCOUNTER — Ambulatory Visit (INDEPENDENT_AMBULATORY_CARE_PROVIDER_SITE_OTHER): Payer: 59

## 2023-02-27 ENCOUNTER — Encounter: Payer: Self-pay | Admitting: Family Medicine

## 2023-02-27 ENCOUNTER — Ambulatory Visit: Payer: 59 | Admitting: Family Medicine

## 2023-02-27 VITALS — BP 90/61 | HR 85 | Temp 96.7°F | Ht 69.0 in | Wt 166.0 lb

## 2023-02-27 DIAGNOSIS — S92535A Nondisplaced fracture of distal phalanx of left lesser toe(s), initial encounter for closed fracture: Secondary | ICD-10-CM | POA: Diagnosis not present

## 2023-02-27 DIAGNOSIS — Z8742 Personal history of other diseases of the female genital tract: Secondary | ICD-10-CM

## 2023-02-27 DIAGNOSIS — M7989 Other specified soft tissue disorders: Secondary | ICD-10-CM | POA: Diagnosis not present

## 2023-02-27 DIAGNOSIS — M79672 Pain in left foot: Secondary | ICD-10-CM

## 2023-02-27 DIAGNOSIS — M7732 Calcaneal spur, left foot: Secondary | ICD-10-CM | POA: Diagnosis not present

## 2023-02-27 MED ORDER — FLUCONAZOLE 150 MG PO TABS
150.0000 mg | ORAL_TABLET | Freq: Once | ORAL | 1 refills | Status: AC
Start: 2023-02-27 — End: 2023-02-27

## 2023-02-27 NOTE — Progress Notes (Signed)
Subjective:  [name], female    DOB: Feb 09, 1956, 67 y.o.   MRN: [id number]  Patient Care Team: Sonny Masters, FNP as PCP - General (Family Medicine) Sheran Luz, MD as Consulting Physician (Physical Medicine and Rehabilitation) Archer Asa, MD as Consulting Physician (Psychiatry) Beryle Beams, MD as Consulting Physician (Neurology) Danella Maiers, [facility] (Pharmacist) Suzi Roots as Physician Assistant (Dermatology) Daisy Lazar, DO (Optometry)   Chief Complaint:  Foot Pain (4th & 5th toe pain. NKI)   HPI: Cynthia Deleon is a 67 y.o. female presenting on 02/27/2023 for Foot Pain (4th & 5th toe pain. NKI)   Pt presents today with complaints of left foot pain with swelling, bruising, and a blistering. States she noticed this today. Denies known injury but does have neuropathy and can not feel her feet.   Foot Pain This is a new problem. The current episode started today. The problem has been unchanged. Associated symptoms include joint swelling. Pertinent negatives include no abdominal pain, anorexia, arthralgias, change in bowel habit, chest pain, chills, congestion, coughing, diaphoresis, fatigue, fever, headaches, myalgias, nausea, neck pain, numbness, rash, sore throat, swollen glands, urinary symptoms, vertigo, visual change, vomiting or weakness. The symptoms are aggravated by walking and twisting. She has tried nothing for the symptoms.      Relevant past medical, surgical, family, and social history reviewed and updated as indicated.  Allergies and medications reviewed and updated. Data reviewed: Chart in Epic.   Past Medical History:  Diagnosis Date   Aortic atherosclerosis (HCC) 10/31/2021   Arthritis    hands and knees   Bipolar 1 disorder (HCC)    Borderline diabetic    Chronic back pain    Chronic neck pain    Complication of anesthesia    high anxiety-does not want to be alone   COPD (chronic obstructive pulmonary  disease) (HCC)    Current use of estrogen therapy 01/12/2014   Depression    Diabetes mellitus without complication (HCC)    Diplopia    Family history of adverse reaction to anesthesia    sister "gas in lungs"   GERD (gastroesophageal reflux disease)    Headache    Hepatic steatosis 10/31/2021   Hot flashes 12/15/2013   Hypertension    IBS (irritable bowel syndrome)    Incontinence    Kidney stone    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HCC)    Osteopenia 02/06/2022   Panic attacks    Peptic ulcer    Peripheral neuropathy    Rosacea    Shingles    Sleep apnea    uses a cpap-with oxygen    Past Surgical History:  Procedure Laterality Date   BIOPSY  04/27/2020   Procedure: BIOPSY;  Surgeon: Bernette Redbird, MD;  Location: [facility];  Service: Endoscopy;;   BUNIONECTOMY WITH HAMMERTOE RECONSTRUCTION Right 12/10/2012   Procedure: RIGHT FIRST METATARSAL CHEVRON BUNION CORRECTION,  2 AND 3 HAMMERTOE CORRECTION , RIGHT 3 AND 4 TOE NAIL EXCISION ;  Surgeon: Toni Arthurs, MD;  Location: [facility];  Service: Orthopedics;  Laterality: Right;   COLONOSCOPY WITH PROPOFOL N/A 04/27/2020   Procedure: COLONOSCOPY WITH PROPOFOL;  Surgeon: Bernette Redbird, MD;  Location: [facility];  Service: Endoscopy;  Laterality: N/A;   FOOT ARTHRODESIS  2000   both feet   LIPOSUCTION  03/2018   RECTAL SURGERY     TONSILLECTOMY     TOTAL ABDOMINAL HYSTERECTOMY      Social History  Socioeconomic History   Marital status: Divorced    Spouse name: Not on file   Number of children: 5   Years of education: 9th   Highest education level: Not on file  Occupational History   Occupation: Disabled  Tobacco Use   Smoking status: Former    Current packs/day: 0.00    Average packs/day: 1 pack/day for 42.0 years (42.0 ttl pk-yrs)    Types: Cigarettes    Start date: 06/11/1975    Quit date: 06/10/2017    Years since quitting: 5.7   Smokeless tobacco: Never  Vaping Use   Vaping status:  Never Used  Substance and Sexual Activity   Alcohol use: No    Alcohol/week: 0.0 standard drinks of alcohol   Drug use: No   Sexual activity: Not Currently    Birth control/protection: Surgical    Comment: hyst  Other Topics Concern   Not on file  Social History Narrative   Lives at home with home with her son (has five children).   Right-handed.   1 cup caffeine per day.   Social Determinants of Health   Financial Resource Strain: Low Risk  (08/21/2022)   Overall Financial Resource Strain (CARDIA)    Difficulty of Paying Living Expenses: Not hard at all  Food Insecurity: No Food Insecurity (08/21/2022)   Hunger Vital Sign    Worried About Running Out of Food in the Last Year: Never true    Ran Out of Food in the Last Year: Never true  Transportation Needs: No Transportation Needs (08/21/2022)   PRAPARE - Administrator, Civil Service (Medical): No    Lack of Transportation (Non-Medical): No  Physical Activity: Inactive (08/21/2022)   Exercise Vital Sign    Days of Exercise per Week: 0 days    Minutes of Exercise per Session: 0 min  Stress: No Stress Concern Present (08/21/2022)   [employer] of Occupational Health - Occupational Stress Questionnaire    Feeling of Stress : Not at all  Social Connections: Moderately Integrated (08/21/2022)   Social Connection and Isolation Panel [NHANES]    Frequency of Communication with Friends and Family: More than three times a week    Frequency of Social Gatherings with Friends and Family: More than three times a week    Attends Religious Services: More than 4 times per year    Active Member of [employer] or Organizations: Yes    Attends Banker Meetings: More than 4 times per year    Marital Status: Divorced  Intimate Partner Violence: Not At Risk (08/21/2022)   Humiliation, Afraid, Rape, and Kick questionnaire    Fear of Current or Ex-Partner: No    Emotionally Abused: No    Physically Abused: No    Sexually  Abused: No    Outpatient Encounter Medications as of 02/27/2023  Medication Sig   albuterol (VENTOLIN HFA) 108 (90 Base) MCG/ACT inhaler INHALE 2PUFFS EVERY 6 HOURS AS NEEDED.   ARIPiprazole (ABILIFY) 10 MG tablet Take 10 mg by mouth every morning.   Artificial Saliva (BIOTENE DRY MOUTH MOISTURIZING) SOLN Use as directed 5 mLs in the mouth or throat in the morning and at bedtime.   aspirin EC 81 MG tablet Take 81 mg by mouth daily. Swallow whole.   atorvastatin (LIPITOR) 80 MG tablet Take 1 tablet (80 mg total) by mouth daily.   cephALEXin (KEFLEX) 500 MG capsule Take 1 capsule (500 mg total) by mouth 2 (two) times daily for 7 days.  cholecalciferol (VITAMIN D) 25 MCG tablet Take 1 tablet (1,000 Units total) by mouth daily.   Continuous Blood Gluc Sensor (FREESTYLE LIBRE 2 SENSOR) MISC Apply to arm every 14 days to check blood sugar continuously. DX E11.65   diazepam (VALIUM) 5 MG tablet Take 5 mg by mouth 2 (two) times daily.   DULoxetine (CYMBALTA) 60 MG capsule TAKE (1) CAPSULE BY MOUTH EVERY DAY.   estrogens, conjugated, (PREMARIN) 0.3 MG tablet TAKE (1) TABLET BY MOUTH ONCE DAILY.   ezetimibe (ZETIA) 10 MG tablet TAKE (1) TABLET BY MOUTH ONCE DAILY.   fluconazole (DIFLUCAN) 150 MG tablet Take 1 tablet (150 mg total) by mouth once for 1 dose.   fluorometholone (FML) 0.1 % ophthalmic suspension SMARTSIG:In Eye(s)   fluticasone (FLONASE) 50 MCG/ACT nasal spray USE 2 SPRAYS IN EACH NOSTRIL ONCE DAILY. (Patient taking differently: Place 2 sprays into both nostrils daily.)   glipiZIDE (GLUCOTROL XL) 5 MG 24 hr tablet TAKE (1) TABLET BY MOUTH TWICE DAILY WITH A MEAL.   insulin lispro (HUMALOG KWIKPEN) 100 UNIT/ML KwikPen Use 5-10 units 2 times daily with breakfast and dinner when blood sugar is >200 on steroids   Insulin Pen Needle (PIP PEN NEEDLES 32G X ) 32G X 4 MM MISC Use to inject insulin 2 times daily as needed. DX E11.65   lamoTRIgine (LAMICTAL) 200 MG tablet Take 200 mg by mouth at  bedtime. For mood disorder.   lansoprazole (PREVACID) 30 MG capsule TAKE (1) CAPSULE BY MOUTH ONCE DAILY.   levocetirizine (XYZAL) 5 MG tablet Take 1 tablet (5 mg total) by mouth every evening.   lisinopril (ZESTRIL) 5 MG tablet TAKE (1) TABLET BY MOUTH ONCE DAILY.   meclizine (ANTIVERT) 25 MG tablet Take 2 tablets (50 mg total) by mouth daily as needed for dizziness.   mirabegron ER (MYRBETRIQ) 50 MG TB24 tablet Take 1 tablet (50 mg total) by mouth daily.   montelukast (SINGULAIR) 10 MG tablet TAKE (1) TABLET BY MOUTH AT BEDTIME.   OneTouch Delica Lancets 30G MISC Use to test blood sugar twice daily. DX: E11.9   oxyCODONE (ROXICODONE) 15 MG immediate release tablet Take 1 tablet (15 mg total) by mouth every 6 (six) hours as needed for pain.   pregabalin (LYRICA) 150 MG capsule TAKE 1 CAPSULE BY MOUTH THREE TIMES A DAY.   promethazine (PHENERGAN) 12.5 MG tablet Take 1 tablet (12.5 mg total) by mouth every 8 (eight) hours as needed for nausea or vomiting.   tirzepatide (MOUNJARO) 10 MG/0.5ML Pen Inject 10 mg into the skin once a week.   umeclidinium-vilanterol (ANORO ELLIPTA) 62.5-25 MCG/ACT AEPB Inhale 1 puff into the lungs daily.   valACYclovir (VALTREX) 1000 MG tablet Take 1 tablet (1,000 mg total) by mouth daily.   vitamin B-12 1000 MCG tablet Take 1 tablet (1,000 mcg total) by mouth daily.   VRAYLAR 3 MG capsule Take 3 mg by mouth at bedtime.   No facility-administered encounter medications on file as of 02/27/2023.    Allergies  Allergen Reactions   Divalproex Sodium Other (See Comments)    Hallucinations   Other reaction(s): Confusion  Hallucinations     Other reaction(s): Confusion    Hallucinations   Other Anaphylaxis, Other (See Comments), Nausea And Vomiting and Nausea Only   Pseudoeph-Hydrocodone-Gg Nausea Only   Sulfa Antibiotics Anaphylaxis, Nausea Only, Other (See Comments) and Swelling    Swelling of tongue.   Valproic Acid Other (See Comments)   Varenicline Other  (See Comments)    Altered  mental status-Per patient was put on allergy list by Dr Delbert Harness bu patient staes she is not allergic to this and is currently taking it.   Varenicline Tartrate Other (See Comments)    Other reaction(s): Confusion   Codeine Nausea And Vomiting and Other (See Comments)    Other reaction(s): vomiting   Hydrocodone Nausea And Vomiting and Other (See Comments)   Ambien [Zolpidem] Other (See Comments)    Causes sleep walking   Clavulanic Acid Diarrhea   Elemental Sulfur Other (See Comments)    Swelling of tongue   Hydrocod Poli-Chlorphe Poli Er Other (See Comments)    Other reaction(s): Skin itches    Macrobid [Nitrofurantoin] Nausea And Vomiting    Dizziness, nausea, vomiting   Paxil [Paroxetine Hcl] Other (See Comments)    Causes ringing in the ears.    Prednisone Other (See Comments)    Other reaction(s): Unknown    Amoxicillin Rash    Has patient had a PCN reaction causing immediate rash, facial/tongue/throat swelling, SOB or lightheadedness with hypotension: No Has patient had a PCN reaction causing severe rash involving mucus membranes or skin necrosis: No Has patient had a PCN reaction that required hospitalization: No Has patient had a PCN reaction occurring within the last 10 years: Yes If all of the above answers are "NO", then may proceed with Cephalosporin use.    [place] [Dapagliflozin]     RECURRENT YEAST/UTI   Metformin And Related Diarrhea   Tape Rash    Review of Systems  Constitutional:  Negative for chills, diaphoresis, fatigue and fever.  HENT:  Negative for congestion and sore throat.   Respiratory:  Negative for cough.   Cardiovascular:  Negative for chest pain.  Gastrointestinal:  Negative for abdominal pain, anorexia, change in bowel habit, nausea and vomiting.  Musculoskeletal:  Positive for gait problem and joint swelling. Negative for arthralgias, back pain, myalgias and neck pain.  Skin:  Positive for color change. Negative  for rash.  Neurological:  Negative for vertigo, weakness, numbness and headaches.        Objective:  BP 90/61   Pulse 85   Temp (!) 96.7 F (35.9 C) (Temporal)   Ht 5\' 9"  (1.753 m)   Wt 166 lb (75.3 kg)   SpO2 93%   BMI 24.51 kg/m    Wt Readings from Last 3 Encounters:  02/27/23 166 lb (75.3 kg)  02/25/23 166 lb 12.8 oz (75.7 kg)  01/17/23 166 lb 8 oz (75.5 kg)    Physical Exam Vitals and nursing note reviewed.  Constitutional:      General: She is not in acute distress.    Appearance: Normal appearance. She is normal weight. She is not ill-appearing, toxic-appearing or diaphoretic.  HENT:     Head: Normocephalic and atraumatic.     Nose: Nose normal.     Mouth/Throat:     Mouth: Mucous membranes are moist.  Eyes:     Conjunctiva/sclera: Conjunctivae normal.     Pupils: Pupils are equal, round, and reactive to light.  Cardiovascular:     Rate and Rhythm: Normal rate and regular rhythm.     Pulses: Normal pulses.  Pulmonary:     Effort: Pulmonary effort is normal.  Musculoskeletal:     Cervical back: Normal range of motion and neck supple.     Left foot: Decreased range of motion. Deformity and Charcot foot present.       Feet:  Feet:     Left foot:  Skin integrity: Blister and erythema present.  Skin:    General: Skin is warm and dry.     Capillary Refill: Capillary refill takes less than 2 seconds.     Findings: Bruising and ecchymosis present.       Neurological:     General: No focal deficit present.     Mental Status: She is alert.  Psychiatric:        Mood and Affect: Mood normal.        Behavior: Behavior normal.        Thought Content: Thought content normal.        Judgment: Judgment normal.     Results for orders placed or performed in visit on 02/25/23  Urine Culture   Specimen: Urine   UR  Result Value Ref Range   Urine Culture, Routine Final report (A)    Organism ID, Bacteria Enterococcus faecalis (A)   Microscopic Examination    Urine  Result Value Ref Range   WBC, UA 0-5 0 - 5 /hpf   RBC, Urine None seen 0 - 2 /hpf   Epithelial Cells (non renal) 0-10 0 - 10 /hpf   Renal Epithel, UA None seen None seen /hpf   Mucus, UA Present (A) Not Estab.   Bacteria, UA Moderate (A) None seen/Few   Yeast, UA None seen None seen  Bayer DCA Hb A1c Waived  Result Value Ref Range   HB A1C (BAYER DCA - WAIVED) 6.3 (H) 4.8 - 5.6 %  Urinalysis, Routine w reflex microscopic  Result Value Ref Range   Specific Gravity, UA 1.025 1.005 - 1.030   pH, UA 5.5 5.0 - 7.5   Color, UA Yellow Yellow   Appearance Ur Clear Clear   Leukocytes,UA Negative Negative   Protein,UA Negative Negative/Trace   Glucose, UA Negative Negative   Ketones, UA Trace (A) Negative   RBC, UA Negative Negative   Bilirubin, UA Negative Negative   Urobilinogen, Ur 0.2 0.2 - 1.0 mg/dL   Nitrite, UA Negative Negative   Microscopic Examination See below:   Microalbumin / creatinine urine ratio  Result Value Ref Range   Creatinine, Urine 123.3 Not Estab. mg/dL   Microalbumin, Urine 16.1 Not Estab. ug/mL   Microalb/Creat Ratio 9 0 - 29 mg/g creat     X-Ray: left foot: fracture of left fifth toe. Preliminary x-ray reading by Kari Baars, FNP-C, [facility].   Pertinent labs & imaging results that were available during my care of the patient were reviewed by me and considered in my medical decision making.  Assessment & Plan:  Yanae was seen today for foot pain.  Diagnoses and all orders for this visit:  Left foot pain Closed nondisplaced fracture of distal phalanx of lesser toe of left foot, initial encounter Imaging with noted fifth toe fracture. Placed in postop shoe today. Referral to ortho placed.  -     DG Foot Complete Left; Future -     Ambulatory referral to Orthopedic Surgery  History of vaginitis Pt is on antibiotics and reports development of yeast due to antibiotics, will treat with below.  -     fluconazole (DIFLUCAN) 150 MG tablet; Take 1  tablet (150 mg total) by mouth once for 1 dose.     Continue all other maintenance medications.  Follow up plan: Return if symptoms worsen or fail to improve.   Continue healthy lifestyle choices, including diet (rich in fruits, vegetables, and lean proteins, and low in salt and simple carbohydrates)  and exercise (at least 30 minutes of moderate physical activity daily).  Educational handout given for toe fracture  The above assessment and management plan was discussed with the patient. The patient verbalized understanding of and has agreed to the management plan. Patient is aware to call the clinic if they develop any new symptoms or if symptoms persist or worsen. Patient is aware when to return to the clinic for a follow-up visit. Patient educated on when it is appropriate to go to the emergency department.   Kari Baars, FNP-C [facility] [phone]

## 2023-02-28 LAB — URINE CULTURE

## 2023-03-03 ENCOUNTER — Telehealth: Payer: Self-pay | Admitting: Family Medicine

## 2023-03-03 DIAGNOSIS — G4733 Obstructive sleep apnea (adult) (pediatric): Secondary | ICD-10-CM | POA: Diagnosis not present

## 2023-03-03 NOTE — Telephone Encounter (Signed)
Pt wants her referral sent to Dr Arvil Chaco office. Has not heard anything from [facility] and she says Dr Ulice Brilliant can see her for her broken toe and would be more convenient for her.

## 2023-03-04 DIAGNOSIS — M79672 Pain in left foot: Secondary | ICD-10-CM | POA: Diagnosis not present

## 2023-03-05 NOTE — Telephone Encounter (Signed)
I want her to see the ortho people as she has a fractured toe. She already sees Dr. Ulice Brilliant and can see him also if she would like.

## 2023-03-05 NOTE — Telephone Encounter (Signed)
Sending this to you - Patient has a Referral to Orthopedics which was sent to [facility] less than a week ago, Patient is stating she would like to see Dr. Adam Phenix (Podiatrist) for this issue - If this is ok please place a Referral for Podiatry and I will get sent over.

## 2023-03-07 DIAGNOSIS — S92515A Nondisplaced fracture of proximal phalanx of left lesser toe(s), initial encounter for closed fracture: Secondary | ICD-10-CM | POA: Diagnosis not present

## 2023-03-07 NOTE — Telephone Encounter (Signed)
Okay - I will not cancel the Referral to [facility].

## 2023-03-13 ENCOUNTER — Other Ambulatory Visit: Payer: Self-pay | Admitting: Family Medicine

## 2023-03-13 DIAGNOSIS — Z8742 Personal history of other diseases of the female genital tract: Secondary | ICD-10-CM

## 2023-03-21 DIAGNOSIS — S92515A Nondisplaced fracture of proximal phalanx of left lesser toe(s), initial encounter for closed fracture: Secondary | ICD-10-CM | POA: Diagnosis not present

## 2023-03-31 DIAGNOSIS — M25511 Pain in right shoulder: Secondary | ICD-10-CM | POA: Diagnosis not present

## 2023-04-01 ENCOUNTER — Telehealth: Payer: 59

## 2023-04-01 ENCOUNTER — Ambulatory Visit (INDEPENDENT_AMBULATORY_CARE_PROVIDER_SITE_OTHER): Payer: 59 | Admitting: Pharmacist

## 2023-04-01 DIAGNOSIS — Z7985 Long-term (current) use of injectable non-insulin antidiabetic drugs: Secondary | ICD-10-CM

## 2023-04-01 DIAGNOSIS — E119 Type 2 diabetes mellitus without complications: Secondary | ICD-10-CM

## 2023-04-01 NOTE — Progress Notes (Unsigned)
04/01/2023 Name: Cynthia Deleon MRN: [id number] DOB: [birth year]  Chief Complaint  Patient presents with   Diabetes    Cynthia Deleon is a 67 y.o. year old female who presented for a telephone visit.   They were referred to the pharmacist by their PCP for assistance in managing diabetes.    Subjective:  Care Team: Primary Care Provider: Sonny Masters, FNP ; Next Scheduled Visit: *** {careteamprovider:[id number]}  Medication Access/Adherence  Current Pharmacy:  [facility] [place], [place] - [id number] [place] [address] Phone: [phone] Fax: [phone]  [facility] - [place], [place] - [address] [place] [address] Phone: [phone] Fax: [phone]   Patient reports affordability concerns with their medications: No  Patient reports access/transportation concerns to their pharmacy: No  Patient reports adherence concerns with their medications:  No    Diabetes:  Current medications:  Medications tried in the past:   Current glucose readings: *** Using *** meter; testing *** times daily  Date of Download: *** % Time CGM is active: ***% Average Glucose: *** mg/dL Glucose Management Indicator: ***  Glucose Variability: *** (goal <36%) Time in Goal:  - Time in range 70-180: ***% - Time above range: ***% - Time below range: ***% Observed patterns:  Patient {Actions; denies-reports:120008} hypoglycemic s/sx including ***dizziness, shakiness, sweating. Patient {Actions; denies-reports:120008} hyperglycemic symptoms including ***polyuria, polydipsia, polyphagia, nocturia, neuropathy, blurred vision.  Current meal patterns:  - Breakfast: *** - Lunch *** - Supper *** - Snacks *** - Drinks ***  Current physical activity: ***  Current medication access support: ***   Objective:  Lab Results  Component Value Date   HGBA1C 6.3 (H) 02/25/2023    Lab Results   Component Value Date   CREATININE 0.89 11/22/2022   BUN 11 11/22/2022   NA 141 11/22/2022   K 4.3 11/22/2022   CL 103 11/22/2022   CO2 24 11/22/2022    Lab Results  Component Value Date   CHOL 137 11/22/2022   HDL 46 11/22/2022   LDLCALC 66 11/22/2022   TRIG 141 11/22/2022   CHOLHDL 3.0 11/22/2022    Medications Reviewed Today   Medications were not reviewed in this encounter      Assessment/Plan:   Diabetes: - Currently controlled - Reviewed long term cardiovascular and renal outcomes of uncontrolled blood sugar - Reviewed goal A1c, goal fasting, and goal 2 hour post prandial glucose - Reviewed dietary modifications including *** - Reviewed lifestyle modifications including: - Recommend to ***  - Recommend to check glucose *** - glipizide cancelled    Follow Up Plan: 3 months   Kieth Brightly, PharmD, BCACP, CPP Clinical Pharmacist, [facility]

## 2023-04-02 DIAGNOSIS — G4733 Obstructive sleep apnea (adult) (pediatric): Secondary | ICD-10-CM | POA: Diagnosis not present

## 2023-04-09 ENCOUNTER — Ambulatory Visit: Payer: 59 | Admitting: Primary Care

## 2023-04-15 ENCOUNTER — Emergency Department (HOSPITAL_COMMUNITY): Payer: 59

## 2023-04-15 ENCOUNTER — Emergency Department (HOSPITAL_COMMUNITY)
Admission: EM | Admit: 2023-04-15 | Discharge: 2023-04-15 | Disposition: A | Discharge status: Home / Self Care | Payer: 59 | Attending: Student | Admitting: Student

## 2023-04-15 ENCOUNTER — Other Ambulatory Visit: Payer: Self-pay

## 2023-04-15 ENCOUNTER — Encounter (HOSPITAL_COMMUNITY): Payer: Self-pay | Admitting: *Deleted

## 2023-04-15 DIAGNOSIS — D72829 Elevated white blood cell count, unspecified: Secondary | ICD-10-CM | POA: Diagnosis not present

## 2023-04-15 DIAGNOSIS — K529 Noninfective gastroenteritis and colitis, unspecified: Secondary | ICD-10-CM | POA: Insufficient documentation

## 2023-04-15 DIAGNOSIS — R1030 Lower abdominal pain, unspecified: Secondary | ICD-10-CM | POA: Diagnosis present

## 2023-04-15 DIAGNOSIS — Z7982 Long term (current) use of aspirin: Secondary | ICD-10-CM | POA: Insufficient documentation

## 2023-04-15 DIAGNOSIS — Z79899 Other long term (current) drug therapy: Secondary | ICD-10-CM | POA: Insufficient documentation

## 2023-04-15 DIAGNOSIS — R1900 Intra-abdominal and pelvic swelling, mass and lump, unspecified site: Secondary | ICD-10-CM | POA: Diagnosis not present

## 2023-04-15 DIAGNOSIS — R1032 Left lower quadrant pain: Secondary | ICD-10-CM | POA: Diagnosis not present

## 2023-04-15 LAB — URINALYSIS, ROUTINE W REFLEX MICROSCOPIC
Bilirubin Urine: NEGATIVE
Glucose, UA: 50 mg/dL — AB
Hgb urine dipstick: NEGATIVE
Ketones, ur: NEGATIVE mg/dL
Leukocytes,Ua: NEGATIVE
Nitrite: NEGATIVE
Protein, ur: NEGATIVE mg/dL
Specific Gravity, Urine: >1.046 — ABNORMAL HIGH (ref 1.005–1.030)
pH: 6 (ref 5.0–8.0)

## 2023-04-15 LAB — COMPREHENSIVE METABOLIC PANEL
ALT: 15 U/L (ref 0–44)
AST: 20 U/L (ref 15–41)
Albumin: 3.6 g/dL (ref 3.5–5.0)
Alkaline Phosphatase: 55 U/L (ref 38–126)
Anion gap: 9 (ref 5–15)
BUN: 11 mg/dL (ref 8–23)
CO2: 24 mmol/L (ref 22–32)
Calcium: 8.8 mg/dL — ABNORMAL LOW (ref 8.9–10.3)
Chloride: 101 mmol/L (ref 98–111)
Creatinine, Ser: 0.66 mg/dL (ref 0.44–1.00)
GFR, Estimated: >60 mL/min (ref >60)
Glucose, Bld: 202 mg/dL — ABNORMAL HIGH (ref 70–99)
Potassium: 4 mmol/L (ref 3.5–5.1)
Sodium: 134 mmol/L — ABNORMAL LOW (ref 135–145)
Total Bilirubin: 1.3 mg/dL — ABNORMAL HIGH (ref <1.2)
Total Protein: 6.3 g/dL — ABNORMAL LOW (ref 6.5–8.1)

## 2023-04-15 LAB — CBC
HCT: 47.9 % — ABNORMAL HIGH (ref 36.0–46.0)
Hemoglobin: 16.1 g/dL — ABNORMAL HIGH (ref 12.0–15.0)
MCH: 33.2 pg (ref 26.0–34.0)
MCHC: 33.6 g/dL (ref 30.0–36.0)
MCV: 98.8 fL (ref 80.0–100.0)
Platelets: 204 10*3/uL (ref 150–400)
RBC: 4.85 MIL/uL (ref 3.87–5.11)
RDW: 13 % (ref 11.5–15.5)
WBC: 12.2 10*3/uL — ABNORMAL HIGH (ref 4.0–10.5)
nRBC: 0 % (ref 0.0–0.2)

## 2023-04-15 LAB — LIPASE, BLOOD: Lipase: 25 U/L (ref 11–51)

## 2023-04-15 MED ORDER — DIPHENHYDRAMINE HCL 50 MG/ML IJ SOLN
12.5000 mg | Freq: Once | INTRAMUSCULAR | Status: AC
  Administered 2023-04-15: 12.5 mg via INTRAVENOUS
  Filled 2023-04-15: qty 1

## 2023-04-15 MED ORDER — ONDANSETRON HCL 4 MG/2ML IJ SOLN
4.0000 mg | Freq: Once | INTRAMUSCULAR | Status: AC
  Administered 2023-04-15: 4 mg via INTRAVENOUS
  Filled 2023-04-15: qty 2

## 2023-04-15 MED ORDER — MORPHINE SULFATE (PF) 4 MG/ML IV SOLN
3.0000 mg | Freq: Once | INTRAVENOUS | Status: AC
Start: 2023-04-15 — End: 2023-04-15
  Administered 2023-04-15: 3 mg via INTRAVENOUS
  Filled 2023-04-15: qty 1

## 2023-04-15 MED ORDER — IOHEXOL 300 MG/ML  SOLN
100.0000 mL | Freq: Once | INTRAMUSCULAR | Status: AC | PRN
  Administered 2023-04-15: 100 mL via INTRAVENOUS

## 2023-04-15 MED ORDER — AMOXICILLIN-POT CLAVULANATE 875-125 MG PO TABS
1.0000 | ORAL_TABLET | Freq: Once | ORAL | Status: AC
  Administered 2023-04-15: 1 via ORAL
  Filled 2023-04-15: qty 1

## 2023-04-15 MED ORDER — AMOXICILLIN-POT CLAVULANATE 875-125 MG PO TABS: 1.0000 | ORAL_TABLET | Freq: Two times a day (BID) | ORAL | 0 refills | Status: DC

## 2023-04-15 NOTE — ED Notes (Signed)
Pt requesting something to eat and drink. Ginger ale and graham crackers provided to patient and daughter.

## 2023-04-15 NOTE — ED Provider Triage Note (Signed)
Emergency Medicine Provider Triage Evaluation Note  Cynthia Deleon , a 67 y.o. female  was evaluated in triage.  Pt complains of lower abdominal pain x 4 days.  Pain is crampy and constant.  Initially had loose stools that she states have been black in color.  She took Kaopectate and bowel movements have diminished.  Continues to have crampy pain describes pain of her entire lower abdomen slightly worse on left.  Pain has been associated with some nausea but no vomiting.  No fever.  No chest pain or shortness of breath.  Denies any dysuria symptoms.  She noticed few episodes of bright red blood on the tissue, unsure if this is from her vagina or rectum. Denies any excessive aspirin use, no recent abdominal surgeries. Review of Systems  Positive: Abdominal pain, diarrhea, black stools, nausea, occasional bright red blood on the tissue after defecating Negative: Vomiting, fever, chest pain, shortness of breath, flank pain  Physical Exam  BP 116/66 (BP Location: Right Arm)   Pulse (!) 110   Temp 98.2 F (36.8 C) (Oral)   Resp 18   Ht 5\' 9"  (1.753 m)   Wt 7.711 kg   SpO2 94%   BMI 2.51 kg/m  Gen:   Awake, no distress   Resp:  Normal effort  MSK:   Moves extremities without difficulty  Other:  Diffuse pain lower abdomen, left greater than right  Medical Decision Making  Medically screening exam initiated at 1:15 PM.  Appropriate orders placed.  Cynthia Deleon was informed that the remainder of the evaluation will be completed by another provider, this initial triage assessment does not replace that evaluation, and the importance of remaining in the ED until their evaluation is complete.     Pauline Aus, PA-C 04/15/23 1318

## 2023-04-15 NOTE — Discharge Instructions (Signed)
Your workup this evening shows that you have colitis.  This is likely the cause of your lower abdominal pain.  Please take the antibiotic as directed until finished.  I have listed a local gastroenterologist for you to contact to arrange follow-up appointment.  If you are past due for a colonoscopy he can also arrange this for you return to the emergency department if you develop any new or worsening symptoms.

## 2023-04-15 NOTE — ED Triage Notes (Signed)
Pt with abd pain and swelling x 2 days with bright red blood noted on toilet paper, unsure if from vaginal or rectal. Black diarrhea the other day.  N/V several days ago.

## 2023-04-16 ENCOUNTER — Ambulatory Visit: Payer: 59 | Admitting: Family Medicine

## 2023-04-18 NOTE — ED Provider Notes (Signed)
Cynthia Deleon EMERGENCY DEPARTMENT AT [facility] Provider Note   CSN: [id number] Arrival date & time: 04/15/23  0941     History  Chief Complaint  Patient presents with   Abdominal Pain    Cynthia Deleon is a 67 y.o. female.   Abdominal Pain Associated symptoms: nausea and vomiting   Associated symptoms: no chest pain, no chills, no diarrhea, no dysuria, no fever and no shortness of breath        Cynthia Deleon is a 67 y.o. female who presents to the Emergency Department complaining of abd pain, nausea and vomiting.  Abdominal pain x 2 days. Describes sharp pain to her lower abdomen and she describes seeing intermittent bright red blood on toilet paper, not sure if it's from her rectum or vagina.  Denies vaginal pain and dysuria.  No fever chills, flank pain.  She also denies dizziness, weakness or syncope.   Home Medications Prior to Admission medications   Medication Sig Start Date End Date Taking? Authorizing Provider  amoxicillin-clavulanate (AUGMENTIN) 875-125 MG tablet Take 1 tablet by mouth every 12 (twelve) hours. 04/15/23  Yes Seleny Allbright, PA-C  albuterol (VENTOLIN HFA) 108 (90 Base) MCG/ACT inhaler INHALE 2PUFFS EVERY 6 HOURS AS NEEDED. 01/20/23   Sonny Masters, FNP  ARIPiprazole (ABILIFY) 10 MG tablet Take 10 mg by mouth every morning. 10/19/22   [provider]  Artificial Saliva (BIOTENE DRY MOUTH MOISTURIZING) SOLN Use as directed 5 mLs in the mouth or throat in the morning and at bedtime. 05/22/22   Sonny Masters, FNP  aspirin EC 81 MG tablet Take 81 mg by mouth daily. Swallow whole.    [provider]  atorvastatin (LIPITOR) 80 MG tablet Take 1 tablet (80 mg total) by mouth daily. 01/08/23   O'NealRonnald Ramp, MD  cholecalciferol (VITAMIN D) 25 MCG tablet Take 1 tablet (1,000 Units total) by mouth daily. 11/01/21   Standley Brooking, MD  Continuous Blood Gluc Sensor (FREESTYLE LIBRE 2 SENSOR) MISC Apply to arm every 14 days  to check blood sugar continuously. DX E11.65 04/25/22   Sonny Masters, FNP  diazepam (VALIUM) 5 MG tablet Take 5 mg by mouth 2 (two) times daily. 12/19/21   [provider]  DULoxetine (CYMBALTA) 60 MG capsule TAKE (1) CAPSULE BY MOUTH EVERY DAY. 12/18/22   Levert Feinstein, MD  estrogens, conjugated, (PREMARIN) 0.3 MG tablet TAKE (1) TABLET BY MOUTH ONCE DAILY. 09/20/22   Sonny Masters, FNP  ezetimibe (ZETIA) 10 MG tablet TAKE (1) TABLET BY MOUTH ONCE DAILY. 01/08/23   Sande Rives, MD  fluorometholone (FML) 0.1 % ophthalmic suspension SMARTSIG:In Eye(s) 10/03/21   [provider]  fluticasone (FLONASE) 50 MCG/ACT nasal spray USE 2 SPRAYS IN EACH NOSTRIL ONCE DAILY. Patient taking differently: Place 2 sprays into both nostrils daily. 10/11/21   Gwenlyn Fudge, FNP  insulin lispro (HUMALOG KWIKPEN) 100 UNIT/ML KwikPen Use 5-10 units 2 times daily with breakfast and dinner when blood sugar is >200 on steroids Patient not taking: Reported on 04/01/2023 04/25/22   Sonny Masters, FNP  Insulin Pen Needle (PIP PEN NEEDLES 32G X ) 32G X 4 MM MISC Use to inject insulin 2 times daily as needed. DX E11.65 04/25/22   Sonny Masters, FNP  lamoTRIgine (LAMICTAL) 200 MG tablet Take 200 mg by mouth at bedtime. For mood disorder.    [provider]  lansoprazole (PREVACID) 30 MG capsule TAKE (1) CAPSULE BY MOUTH ONCE  DAILY. 01/08/23   Sonny Masters, FNP  levocetirizine (XYZAL) 5 MG tablet Take 1 tablet (5 mg total) by mouth every evening. 03/29/22   Sonny Masters, FNP  lisinopril (ZESTRIL) 5 MG tablet TAKE (1) TABLET BY MOUTH ONCE DAILY. 12/19/22   Sonny Masters, FNP  meclizine (ANTIVERT) 25 MG tablet Take 2 tablets (50 mg total) by mouth daily as needed for dizziness. 03/08/21   Gwenlyn Fudge, FNP  mirabegron ER (MYRBETRIQ) 50 MG TB24 tablet Take 1 tablet (50 mg total) by mouth daily. 08/22/22   Sonny Masters, FNP  montelukast (SINGULAIR) 10 MG tablet TAKE (1) TABLET BY MOUTH AT  BEDTIME. 02/06/23   Rakes, Doralee Albino, FNP  OneTouch Delica Lancets 30G MISC Use to test blood sugar twice daily. DX: E11.9 02/28/20   Deliah Boston F, FNP  oxyCODONE (ROXICODONE) 15 MG immediate release tablet Take 1 tablet (15 mg total) by mouth every 6 (six) hours as needed for pain. 11/23/21   Sherryll Burger, Pratik D, DO  pregabalin (LYRICA) 150 MG capsule TAKE 1 CAPSULE BY MOUTH THREE TIMES A DAY. 01/08/23   Sonny Masters, FNP  promethazine (PHENERGAN) 12.5 MG tablet Take 1 tablet (12.5 mg total) by mouth every 8 (eight) hours as needed for nausea or vomiting. 08/07/22   Rakes, Doralee Albino, FNP  tirzepatide [facility]) 10 MG/0.5ML Pen Inject 10 mg into the skin once a week. 02/25/23   Sonny Masters, FNP  umeclidinium-vilanterol (ANORO ELLIPTA) 62.5-25 MCG/ACT AEPB Inhale 1 puff into the lungs daily. 07/05/22   Coralyn Helling, MD  valACYclovir (VALTREX) 1000 MG tablet Take 1 tablet (1,000 mg total) by mouth daily. 10/28/22   Adline Potter, NP  vitamin B-12 1000 MCG tablet Take 1 tablet (1,000 mcg total) by mouth daily. 11/01/21   Standley Brooking, MD  VRAYLAR 3 MG capsule Take 3 mg by mouth at bedtime. 10/19/22   [provider]      Allergies    Divalproex sodium, Other, Pseudoeph-hydrocodone-gg, Sulfa antibiotics, Valproic acid, Varenicline, Varenicline tartrate, Codeine, Hydrocodone, Ambien [zolpidem], Clavulanic acid, Elemental sulfur, Hydrocod poli-chlorphe poli er, Macrobid [nitrofurantoin], Paxil [paroxetine hcl], Prednisone, Amoxicillin, Farxiga [dapagliflozin], Metformin and related, and Tape    Review of Systems   Review of Systems  Constitutional:  Positive for appetite change. Negative for chills and fever.  Respiratory:  Negative for shortness of breath.   Cardiovascular:  Negative for chest pain.  Gastrointestinal:  Positive for abdominal pain, blood in stool, nausea and vomiting. Negative for diarrhea.  Genitourinary:  Negative for dysuria and flank pain.  Musculoskeletal:  Negative  for arthralgias and back pain.  Neurological:  Negative for dizziness, syncope, weakness and headaches.  Hematological:  Does not bruise/bleed easily.    Physical Exam Updated Vital Signs BP 116/72 (BP Location: Right Arm)   Pulse (!) 101   Temp 98.6 F (37 C) (Oral)   Resp 18   Ht 5\' 9"  (1.753 m)   Wt 7.711 kg   SpO2 95%   BMI 2.51 kg/m  Physical Exam Vitals and nursing note reviewed.  Constitutional:      General: She is not in acute distress.    Appearance: Normal appearance. She is not ill-appearing or toxic-appearing.  HENT:     Mouth/Throat:     Mouth: Mucous membranes are moist.  Cardiovascular:     Rate and Rhythm: Normal rate and regular rhythm.     Pulses: Normal pulses.  Pulmonary:     Effort: Pulmonary  effort is normal.     Breath sounds: Normal breath sounds.  Abdominal:     General: There is no distension.     Palpations: Abdomen is soft.     Tenderness: There is abdominal tenderness. There is no guarding or rebound.     Comments: Tenderness to entire lower abdomen.  Left greater than right.  No CVA tenderness.   Musculoskeletal:        General: Normal range of motion.  Skin:    General: Skin is warm.  Neurological:     General: No focal deficit present.     Mental Status: She is alert.     Sensory: No sensory deficit.     Motor: No weakness.     ED Results / Procedures / Treatments   Labs (all labs ordered are listed, but only abnormal results are displayed) Labs Reviewed  COMPREHENSIVE METABOLIC PANEL - Abnormal; Notable for the following components:      Result Value   Sodium 134 (*)    Glucose, Bld 202 (*)    Calcium 8.8 (*)    Total Protein 6.3 (*)    Total Bilirubin 1.3 (*)    All other components within normal limits  CBC - Abnormal; Notable for the following components:   WBC 12.2 (*)    Hemoglobin 16.1 (*)    HCT 47.9 (*)    All other components within normal limits  URINALYSIS, ROUTINE W REFLEX MICROSCOPIC - Abnormal; Notable  for the following components:   Specific Gravity, Urine >1.046 (*)    Glucose, UA 50 (*)    All other components within normal limits  LIPASE, BLOOD    EKG None  Radiology CT ABDOMEN PELVIS W CONTRAST  Result Date: 04/15/2023 CLINICAL DATA:  Left lower quadrant abdominal pain, right lower quadrant abdominal pain swelling. Possible hematochezia and melena EXAM: CT ABDOMEN AND PELVIS WITH CONTRAST TECHNIQUE: Multidetector CT imaging of the abdomen and pelvis was performed using the standard protocol following bolus administration of intravenous contrast. RADIATION DOSE REDUCTION: This exam was performed according to the departmental dose-optimization program which includes automated exposure control, adjustment of the mA and/or kV according to patient size and/or use of iterative reconstruction technique. CONTRAST:  OMNIPAQUE IOHEXOL 300 MG/ML  SOLN COMPARISON:  10/26/2021 FINDINGS: Lower chest: No acute abnormality. Hepatobiliary: Unremarkable liver. Normal gallbladder. No biliary dilation. Pancreas: Unremarkable. Spleen: Unremarkable. Adrenals/Urinary Tract: Normal adrenal glands. No urinary calculi or hydronephrosis. Bladder is unremarkable. Stomach/Bowel: Stomach is within normal limits. No evidence of bowel obstruction. Moderate wall thickening, mucosal hyperenhancement, and adjacent fat stranding and free fluid about the descending and sigmoid colon. Scattered diverticula. Normal appendix. Vascular/Lymphatic: Aortic atherosclerosis. No enlarged abdominal or pelvic lymph nodes. Reproductive: Hysterectomy. Other: Trace free fluid in the left pericolic gutter. No free intraperitoneal air. Musculoskeletal: No acute fracture. IMPRESSION: 1. Nonspecific colitis of the descending and sigmoid colon. Aortic Atherosclerosis (ICD10-I70.0). Electronically Signed   By: Minerva Fester M.D.   On: 04/15/2023 18:20     Procedures Procedures    Medications Ordered in ED Medications  ondansetron  (ZOFRAN) injection 4 mg (4 mg Intravenous Given 04/15/23 1536)  morphine (PF) 4 MG/ML injection 3 mg (3 mg Intravenous Given 04/15/23 1536)  iohexol (OMNIPAQUE) 300 MG/ML solution 100 mL (100 mLs Intravenous Contrast Given 04/15/23 1523)  diphenhydrAMINE (BENADRYL) injection 12.5 mg (12.5 mg Intravenous Given 04/15/23 1546)  amoxicillin-clavulanate (AUGMENTIN) 875-125 MG per tablet 1 tablet (1 tablet Oral Given 04/15/23 1927)    ED Course/  Medical Decision Making/ A&P                                 Medical Decision Making Pt with diffuse lower abd pain, N/V.  Intermittent bright red blood on the tissue after defecating.  No fever, dysuria, weakness or syncope.    Differential includes diverticulitis, colitis appendicitis, neoplasm, GYN process  Amount and/or Complexity of Data Reviewed Labs: ordered.    Details: Labs show mild leukocytosis, otherwise unremarkable.  Specifically no anemia. Radiology: ordered.    Details: CT abd/pelvis shows colitis of descending and sigmoid colon.   Discussion of management or test interpretation with external provider(s): Work up c/w colitis, no anemia, fever.  Pt non toxic appearing. Appears apprpriate for out patient tx with Augmentin.  I have recommended close GI f/u as pt may need non emergent colonoscopy . Given return precautions.  Risk Prescription drug management.           Final Clinical Impression(s) / ED Diagnoses Final diagnoses:  Colitis    Rx / DC Orders ED Discharge Orders          Ordered    amoxicillin-clavulanate (AUGMENTIN) 875-125 MG tablet  Every 12 hours        04/15/23 1917              Pauline Aus, PA-C 04/18/23 2215    Glendora Score, MD 04/22/23 1920

## 2023-04-24 DIAGNOSIS — M5030 Other cervical disc degeneration, unspecified cervical region: Secondary | ICD-10-CM | POA: Diagnosis not present

## 2023-04-24 DIAGNOSIS — Z79899 Other long term (current) drug therapy: Secondary | ICD-10-CM | POA: Diagnosis not present

## 2023-04-24 DIAGNOSIS — M47896 Other spondylosis, lumbar region: Secondary | ICD-10-CM | POA: Diagnosis not present

## 2023-04-24 DIAGNOSIS — M51361 Other intervertebral disc degeneration, lumbar region with lower extremity pain only: Secondary | ICD-10-CM | POA: Diagnosis not present

## 2023-04-24 DIAGNOSIS — M5416 Radiculopathy, lumbar region: Secondary | ICD-10-CM | POA: Diagnosis not present

## 2023-04-24 DIAGNOSIS — M7062 Trochanteric bursitis, left hip: Secondary | ICD-10-CM | POA: Diagnosis not present

## 2023-04-24 DIAGNOSIS — G894 Chronic pain syndrome: Secondary | ICD-10-CM | POA: Diagnosis not present

## 2023-04-24 DIAGNOSIS — M5412 Radiculopathy, cervical region: Secondary | ICD-10-CM | POA: Diagnosis not present

## 2023-04-27 ENCOUNTER — Other Ambulatory Visit: Payer: Self-pay | Admitting: Family Medicine

## 2023-04-27 DIAGNOSIS — E1169 Type 2 diabetes mellitus with other specified complication: Secondary | ICD-10-CM

## 2023-04-27 DIAGNOSIS — I152 Hypertension secondary to endocrine disorders: Secondary | ICD-10-CM

## 2023-04-30 ENCOUNTER — Other Ambulatory Visit: Payer: Self-pay | Admitting: Family Medicine

## 2023-04-30 DIAGNOSIS — Z79899 Other long term (current) drug therapy: Secondary | ICD-10-CM

## 2023-04-30 DIAGNOSIS — E1142 Type 2 diabetes mellitus with diabetic polyneuropathy: Secondary | ICD-10-CM

## 2023-05-01 ENCOUNTER — Telehealth: Payer: Self-pay

## 2023-05-01 NOTE — Telephone Encounter (Signed)
Transition Care Management Unsuccessful Follow-up Telephone Call  Date of discharge and from where:  [facility]  Attempts:  2nd Attempt  Reason for unsuccessful TCM follow-up call:  No answer/busy   Lenard Forth [place]  [facility], [facility] Guide, Phone: [phone] Website: [email or web address]

## 2023-05-01 NOTE — Telephone Encounter (Signed)
Transition Care Management Unsuccessful Follow-up Telephone Call  Date of discharge and from where:  [facility]  Attempts:  1st Attempt  Reason for unsuccessful TCM follow-up call:  No answer/busy   Cynthia Deleon [place]  [facility], [facility] [place] Guide, Phone: [phone] Website: [email or web address]   .

## 2023-05-02 DIAGNOSIS — S92515A Nondisplaced fracture of proximal phalanx of left lesser toe(s), initial encounter for closed fracture: Secondary | ICD-10-CM | POA: Diagnosis not present

## 2023-05-03 DIAGNOSIS — G4733 Obstructive sleep apnea (adult) (pediatric): Secondary | ICD-10-CM | POA: Diagnosis not present

## 2023-05-05 ENCOUNTER — Other Ambulatory Visit: Payer: Self-pay | Admitting: *Deleted

## 2023-05-05 ENCOUNTER — Telehealth (INDEPENDENT_AMBULATORY_CARE_PROVIDER_SITE_OTHER): Payer: Self-pay | Admitting: Gastroenterology

## 2023-05-05 ENCOUNTER — Ambulatory Visit (INDEPENDENT_AMBULATORY_CARE_PROVIDER_SITE_OTHER): Payer: 59 | Admitting: Gastroenterology

## 2023-05-05 ENCOUNTER — Encounter (INDEPENDENT_AMBULATORY_CARE_PROVIDER_SITE_OTHER): Payer: Self-pay | Admitting: Gastroenterology

## 2023-05-05 VITALS — BP 112/70 | HR 101 | Temp 99.2°F | Ht 69.0 in | Wt 174.0 lb

## 2023-05-05 DIAGNOSIS — R109 Unspecified abdominal pain: Secondary | ICD-10-CM

## 2023-05-05 DIAGNOSIS — K529 Noninfective gastroenteritis and colitis, unspecified: Secondary | ICD-10-CM | POA: Insufficient documentation

## 2023-05-05 DIAGNOSIS — E11649 Type 2 diabetes mellitus with hypoglycemia without coma: Secondary | ICD-10-CM

## 2023-05-05 MED ORDER — DICYCLOMINE HCL 10 MG PO CAPS: 10.0000 mg | ORAL_CAPSULE | Freq: Two times a day (BID) | ORAL | 1 refills | Status: DC | PRN

## 2023-05-05 MED ORDER — FREESTYLE LIBRE 2 SENSOR MISC: 11 refills | Status: DC

## 2023-05-05 NOTE — Progress Notes (Unsigned)
Cynthia Deleon, M.D. Gastroenterology & Hepatology [facility] Gastroenterology [address] [place], [place] [address] Primary Care Physician: Cynthia Masters, FNP [address] [place] [address]  Referring MD: PCP  Chief Complaint: Abdominal pain  History of Present Illness: Cynthia Deleon is a 67 y.o. female with past medical history of IBS, bipolar disorder, chronic back pain, depression, diabetes, COPD, GERD, hypertension, panic attacks, sleep apnea, incontinence, who presents for evaluation of abdominal pain.  Patient reports that at the beginning of November 2024 she presented new onset of abdominal pain diffusely. She states that she had some watery diarrhea x2 as well. States that she had to go to the restroom and fell over the bathtub, and hit her abdomen with the border of the bathtub.  Due to the severity of the pain the patient came to the ER at [facility] on 04/15/2023 after presenting abdominal pain, nausea and vomiting for 2 days.  Labs showed CMP with sodium 134, glucose 202, normal renal and hepatic function, although total bili was mildly elevated at 1.3.  CBC with WBC 12.2, hemoglobin 16.1 and platelets 204.  CT of the abdomen and pelvis with IV contrast was performed which showed nonspecific colitis of the descending and sigmoid colon.  Patient reports she has been incontinent for at least 40 years.  Patient reports that she is still having some abdominal pain in her abdomen diffusely. She reports having sensation that her "abdomen is as painful as it was before, it feels as I'm bruised inside". She states her back is still painful. States that she has presented improvement in her stools "as they are formed, as pellets".  The patient denies having any nausea, vomiting, fever, chills, hematochezia, melena, hematemesis, jaundice, pruritus or weight loss.  Patient was previously being followed by Dr. Matthias Deleon in [place].  Last  Colonoscopy:04/27/2020 - Dr. Pia Deleon- mouthed diverticula were found in the sigmoid colon. There was associated myochosis and fixation of the colon.  The exam was otherwise normal throughout the examined colon.  There is no endoscopic evidence of inflammation, mass or polyps in the entire colon.  Random biopsies were taken with a cold forceps from the entire colon, and from the rectum, for histology, in view of the patient' s IBS and abdominal pain. Estimated blood loss was minimal. Retroflexion was not performed in the rectum due to a small rectal ampulla ( there was a lot of spasm in the rectum) . Antegrade viewing disclosed no abnormalities. Repeat inspection disclosed no additional findings. As mentioned above, stringent efforts were made to remove as much gas as possible during the withdrawl of the scope.  Random biopsies from the colon and rectum were normal.  FHx: neg for any gastrointestinal/liver disease, no malignancies Social: neg smoking, alcohol or illicit drug use Surgical: no abdominal surgeries  Past Medical History: Past Medical History:  Diagnosis Date   Aortic atherosclerosis (HCC) 10/31/2021   Arthritis    hands and knees   Bipolar 1 disorder (HCC)    Borderline diabetic    Chronic back pain    Chronic neck pain    Complication of anesthesia    high anxiety-does not want to be alone   COPD (chronic obstructive pulmonary disease) (HCC)    Current use of estrogen therapy 01/12/2014   Depression    Diabetes mellitus without complication (HCC)    Diplopia    Family history of adverse reaction to anesthesia    sister "gas in  lungs"   GERD (gastroesophageal reflux disease)    Headache    Hepatic steatosis 10/31/2021   Hot flashes 12/15/2013   Hypertension    IBS (irritable bowel syndrome)    Incontinence    Kidney stone    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HCC)    Osteopenia 02/06/2022   Panic attacks    Peptic ulcer    Peripheral neuropathy    Rosacea     Shingles    Sleep apnea    uses a cpap-with oxygen    Past Surgical History: Past Surgical History:  Procedure Laterality Date   BIOPSY  04/27/2020   Procedure: BIOPSY;  Surgeon: Cynthia Redbird, MD;  Location: [facility];  Service: Endoscopy;;   BUNIONECTOMY WITH HAMMERTOE RECONSTRUCTION Right 12/10/2012   Procedure: RIGHT FIRST METATARSAL CHEVRON BUNION CORRECTION,  2 AND 3 HAMMERTOE CORRECTION , RIGHT 3 AND 4 TOE NAIL EXCISION ;  Surgeon: Toni Arthurs, MD;  Location: [facility];  Service: Orthopedics;  Laterality: Right;   COLONOSCOPY WITH PROPOFOL N/A 04/27/2020   Procedure: COLONOSCOPY WITH PROPOFOL;  Surgeon: Cynthia Redbird, MD;  Location: [facility];  Service: Endoscopy;  Laterality: N/A;   FOOT ARTHRODESIS  2000   both feet   LIPOSUCTION  03/2018   RECTAL SURGERY     TONSILLECTOMY     TOTAL ABDOMINAL HYSTERECTOMY      Family History: Family History  Problem Relation Age of Onset   Diabetes Maternal Grandmother    Diabetes Maternal Grandfather    COPD Father    Diabetes Mother    Other Mother        vertigo; chronic eye disease   Alcohol abuse Brother    Alcohol abuse Brother    COPD Brother    Alcohol abuse Brother    Alcohol abuse Brother    Other Brother        aneursym   Heart attack Sister    COPD Sister    Diabetes Sister    Other Sister        hearing problems   Hyperlipidemia Sister    Breast cancer Neg Hx     Social History: Social History   Tobacco Use  Smoking Status Former   Current packs/day: 0.00   Average packs/day: 1 pack/day for 42.0 years (42.0 ttl pk-yrs)   Types: Cigarettes   Start date: 06/11/1975   Quit date: 06/10/2017   Years since quitting: 5.9  Smokeless Tobacco Never   Social History   Substance and Sexual Activity  Alcohol Use No   Alcohol/week: 0.0 standard drinks of alcohol   Social History   Substance and Sexual Activity  Drug Use No    Allergies: Allergies  Allergen Reactions    Divalproex Sodium Other (See Comments)    Hallucinations   Other reaction(s): Confusion  Hallucinations     Other reaction(s): Confusion    Hallucinations   Other Anaphylaxis, Other (See Comments), Nausea And Vomiting and Nausea Only   Pseudoeph-Hydrocodone-Gg Nausea Only   Sulfa Antibiotics Anaphylaxis, Nausea Only, Other (See Comments) and Swelling    Swelling of tongue.   Valproic Acid Other (See Comments)   Varenicline Other (See Comments)    Altered mental status-Per patient was put on allergy list by Dr Delbert Harness bu patient staes she is not allergic to this and is currently taking it.   Varenicline Tartrate Other (See Comments)    Other reaction(s): Confusion   Codeine Nausea And Vomiting and Other (See Comments)    Other  reaction(s): vomiting   Hydrocodone Nausea And Vomiting and Other (See Comments)   Ambien [Zolpidem] Other (See Comments)    Causes sleep walking   Clavulanic Acid Diarrhea   Elemental Sulfur Other (See Comments)    Swelling of tongue   Hydrocod Poli-Chlorphe Poli Er Other (See Comments)    Other reaction(s): Skin itches    Macrobid [Nitrofurantoin] Nausea And Vomiting    Dizziness, nausea, vomiting   Paxil [Paroxetine Hcl] Other (See Comments)    Causes ringing in the ears.    Prednisone Other (See Comments)    Other reaction(s): Unknown    Amoxicillin Rash    Has patient had a PCN reaction causing immediate rash, facial/tongue/throat swelling, SOB or lightheadedness with hypotension: No Has patient had a PCN reaction causing severe rash involving mucus membranes or skin necrosis: No Has patient had a PCN reaction that required hospitalization: No Has patient had a PCN reaction occurring within the last 10 years: Yes If all of the above answers are "NO", then may proceed with Cephalosporin use.    [place] [Dapagliflozin]     RECURRENT YEAST/UTI   Metformin And Related Diarrhea   Tape Rash    Medications: Current Outpatient Medications   Medication Sig Dispense Refill   albuterol (VENTOLIN HFA) 108 (90 Base) MCG/ACT inhaler INHALE 2PUFFS EVERY 6 HOURS AS NEEDED. 8.5 g 0   ARIPiprazole (ABILIFY) 10 MG tablet Take 10 mg by mouth every morning.     Artificial Saliva (BIOTENE DRY MOUTH MOISTURIZING) SOLN Use as directed 5 mLs in the mouth or throat in the morning and at bedtime. 44.3 mL 0   atorvastatin (LIPITOR) 80 MG tablet Take 1 tablet (80 mg total) by mouth daily. 90 tablet 3   cholecalciferol (VITAMIN D) 25 MCG tablet Take 1 tablet (1,000 Units total) by mouth daily.     Continuous Blood Gluc Sensor (FREESTYLE LIBRE 2 SENSOR) MISC Apply to arm every 14 days to check blood sugar continuously. DX E11.65 2 each 11   diazepam (VALIUM) 5 MG tablet Take 5 mg by mouth 2 (two) times daily.     DULoxetine (CYMBALTA) 60 MG capsule TAKE (1) CAPSULE BY MOUTH EVERY DAY. 30 capsule 0   estrogens, conjugated, (PREMARIN) 0.3 MG tablet TAKE (1) TABLET BY MOUTH ONCE DAILY. 30 tablet 10   ezetimibe (ZETIA) 10 MG tablet TAKE (1) TABLET BY MOUTH ONCE DAILY. 30 tablet 5   fluorometholone (FML) 0.1 % ophthalmic suspension SMARTSIG:In Eye(s)     fluticasone (FLONASE) 50 MCG/ACT nasal spray USE 2 SPRAYS IN EACH NOSTRIL ONCE DAILY. (Patient taking differently: Place 2 sprays into both nostrils daily.) 16 g 0   lamoTRIgine (LAMICTAL) 200 MG tablet Take 200 mg by mouth at bedtime. For mood disorder.     lansoprazole (PREVACID) 30 MG capsule TAKE (1) CAPSULE BY MOUTH ONCE DAILY. 30 capsule 5   levocetirizine (XYZAL) 5 MG tablet Take 1 tablet (5 mg total) by mouth every evening. 30 tablet 6   lisinopril (ZESTRIL) 5 MG tablet TAKE (1) TABLET BY MOUTH ONCE DAILY. 90 tablet 0   mirabegron ER (MYRBETRIQ) 50 MG TB24 tablet Take 1 tablet (50 mg total) by mouth daily. 90 tablet 1   montelukast (SINGULAIR) 10 MG tablet TAKE (1) TABLET BY MOUTH AT BEDTIME. 90 tablet 1   OneTouch Delica Lancets 30G MISC Use to test blood sugar twice daily. DX: E11.9 100 each 3    oxyCODONE (ROXICODONE) 15 MG immediate release tablet Take 1 tablet (15 mg total)  by mouth every 6 (six) hours as needed for pain. 10 tablet 0   pregabalin (LYRICA) 150 MG capsule TAKE 1 CAPSULE BY MOUTH THREE TIMES A DAY. 90 capsule 0   promethazine (PHENERGAN) 12.5 MG tablet Take 1 tablet (12.5 mg total) by mouth every 8 (eight) hours as needed for nausea or vomiting. 20 tablet 0   tirzepatide (MOUNJARO) 10 MG/0.5ML Pen Inject 10 mg into the skin once a week. 6 mL 3   umeclidinium-vilanterol (ANORO ELLIPTA) 62.5-25 MCG/ACT AEPB Inhale 1 puff into the lungs daily. 60 each 5   valACYclovir (VALTREX) 1000 MG tablet Take 1 tablet (1,000 mg total) by mouth daily. 30 tablet 12   vitamin B-12 1000 MCG tablet Take 1 tablet (1,000 mcg total) by mouth daily.     VRAYLAR 3 MG capsule Take 3 mg by mouth at bedtime.     aspirin EC 81 MG tablet Take 81 mg by mouth daily. Swallow whole. (Patient not taking: Reported on 05/05/2023)     insulin lispro (HUMALOG KWIKPEN) 100 UNIT/ML KwikPen Use 5-10 units 2 times daily with breakfast and dinner when blood sugar is >200 on steroids (Patient not taking: Reported on 04/01/2023) 15 mL 11   Insulin Pen Needle (PIP PEN NEEDLES 32G X ) 32G X 4 MM MISC Use to inject insulin 2 times daily as needed. DX E11.65 (Patient not taking: Reported on 05/05/2023) 100 each 11   meclizine (ANTIVERT) 25 MG tablet Take 2 tablets (50 mg total) by mouth daily as needed for dizziness. (Patient not taking: Reported on 05/05/2023) 60 tablet 2   No current facility-administered medications for this visit.    Review of Systems: GENERAL: negative for malaise, night sweats HEENT: No changes in hearing or vision, no nose bleeds or other nasal problems. NECK: Negative for lumps, goiter, pain and significant neck swelling RESPIRATORY: Negative for cough, wheezing CARDIOVASCULAR: Negative for chest pain, leg swelling, palpitations, orthopnea GI: SEE HPI MUSCULOSKELETAL: Negative for joint  pain or swelling, back pain, and muscle pain. SKIN: Negative for lesions, rash PSYCH: Negative for sleep disturbance, mood disorder and recent psychosocial stressors. HEMATOLOGY Negative for prolonged bleeding, bruising easily, and swollen nodes. ENDOCRINE: Negative for cold or heat intolerance, polyuria, polydipsia and goiter. NEURO: negative for tremor, gait imbalance, syncope and seizures. The remainder of the review of systems is noncontributory.   Physical Exam: BP 112/70 (BP Location: Right Arm, Patient Position: Sitting, Cuff Size: Normal)   Pulse (!) 101   Temp 99.2 F (37.3 C) (Oral)   Ht 5\' 9"  (1.753 m)   Wt 174 lb (78.9 kg)   BMI 25.70 kg/m  GENERAL: The patient is AO x3, in no acute distress. HEENT: Head is normocephalic and atraumatic. EOMI are intact. Mouth is well hydrated and without lesions. NECK: Supple. No masses LUNGS: Clear to auscultation. No presence of rhonchi/wheezing/rales. Adequate chest expansion HEART: RRR, normal s1 and s2. ABDOMEN:  tender to palpation in the abdomen diffusely, no guarding, no peritoneal signs, and nondistended. BS +. No masses. EXTREMITIES: Without any cyanosis, clubbing, rash, lesions or edema. NEUROLOGIC: AOx3, no focal motor deficit. SKIN: no jaundice, no rashes   Imaging/Labs: as above  I personally reviewed and interpreted the available labs, imaging and endoscopic files.  Impression and Plan: Cynthia Deleon is a 67 y.o. female with past medical history of IBS, bipolar disorder, chronic back pain, depression, diabetes, COPD, GERD, hypertension, panic attacks, sleep apnea, incontinence, who presents for evaluation of abdominal pain.  The patient has  presented new onset of abdominal pain of unclear etiology.  She had some associated diarrhea that has resolved.  It raises the concern for transient episode of gastroenteritis that is now slowly resolving.  Notably, even though she reports having severe pain, her abdomen is very  soft.  We discussed the possibility of proceeding with a colonoscopy to further evaluate her imaging findings.  For now she can take Bentyl as needed to alleviate her abdominal pain episodes.  Patient understood and agreed.  -Schedule colonoscopy -Start Bentyl 1 tablet q12h as needed for abdominal pain  All questions were answered.      Cynthia Blazing, MD Gastroenterology and Hepatology [facility]

## 2023-05-05 NOTE — Telephone Encounter (Signed)
Pt in office and needing TCS (ASA 3). Unable to schedule pt while in office due to talking to insurance in regards to another pt.  Attempted to reach pt but had to leave message. Did leave detailed message in regards to only having 06/05/23 available at this time.

## 2023-05-05 NOTE — Patient Instructions (Signed)
Schedule colonoscopy Start Bentyl 1 tablet q12h as needed for abdominal pain

## 2023-05-06 NOTE — Telephone Encounter (Signed)
Pt left voicemail returning call. Pt unable to do 06/05/23 Returned call to patient and advised that at this time 12/26 is the only date I have available for Dr.Castaneda. advised pt that I would call her with January schedule. Pt verbalized understanding.

## 2023-05-10 DIAGNOSIS — G894 Chronic pain syndrome: Secondary | ICD-10-CM | POA: Diagnosis not present

## 2023-05-10 DIAGNOSIS — M5412 Radiculopathy, cervical region: Secondary | ICD-10-CM | POA: Diagnosis not present

## 2023-05-10 DIAGNOSIS — M5416 Radiculopathy, lumbar region: Secondary | ICD-10-CM | POA: Diagnosis not present

## 2023-05-10 DIAGNOSIS — M5030 Other cervical disc degeneration, unspecified cervical region: Secondary | ICD-10-CM | POA: Diagnosis not present

## 2023-05-12 DIAGNOSIS — M5416 Radiculopathy, lumbar region: Secondary | ICD-10-CM | POA: Diagnosis not present

## 2023-05-19 ENCOUNTER — Institutional Professional Consult (permissible substitution): Payer: 59 | Admitting: Neurology

## 2023-05-21 MED ORDER — PEG 3350-KCL-NA BICARB-NACL 420 G PO SOLR: 4000.0000 mL | Freq: Once | ORAL | 0 refills | Status: AC

## 2023-05-21 NOTE — Addendum Note (Signed)
Addended by: Marlowe Shores on: 05/21/2023 03:40 PM   Modules accepted: Orders

## 2023-05-21 NOTE — Telephone Encounter (Signed)
Pt contacted and scheduled for 06/17/23. Instructions will be sent via mail once pre op is received. Will need to do PA after first of year. Prep sent to pharmacy

## 2023-05-22 DIAGNOSIS — E1142 Type 2 diabetes mellitus with diabetic polyneuropathy: Secondary | ICD-10-CM | POA: Diagnosis not present

## 2023-05-26 ENCOUNTER — Telehealth: Payer: Self-pay | Admitting: Family Medicine

## 2023-05-26 NOTE — Telephone Encounter (Signed)
Copied from CRM [id number]. Topic: General - Other >> May 26, 2023  4:09 PM [name] wrote: Reason for CRM: Patient wanted to advise nurse that she has had her flu shot and booster. Also she has started her Hep series. Pt states she has had one shot of Hep.

## 2023-05-26 NOTE — Telephone Encounter (Signed)
Copied from CRM [id number]. Topic: Appointments - Scheduling Inquiry for Clinic >> May 26, 2023  4:07 PM [name] wrote: Reason for CRM: Patient would like to schedule an appt with Raynelle Fanning. Please give patient a call back to schedule. Also patient is requesting for Raynelle Fanning to give her a call directly. CB #: [phone].

## 2023-05-27 ENCOUNTER — Ambulatory Visit: Payer: 59 | Admitting: Family Medicine

## 2023-05-27 DIAGNOSIS — M5412 Radiculopathy, cervical region: Secondary | ICD-10-CM | POA: Diagnosis not present

## 2023-05-27 NOTE — Telephone Encounter (Signed)
Spoke with patient and documented flu shot date.  She states she will bring [place] a copy of immunization at her next visit.

## 2023-05-29 ENCOUNTER — Other Ambulatory Visit: Payer: Self-pay | Admitting: Family Medicine

## 2023-05-29 DIAGNOSIS — J309 Allergic rhinitis, unspecified: Secondary | ICD-10-CM

## 2023-05-29 DIAGNOSIS — N958 Other specified menopausal and perimenopausal disorders: Secondary | ICD-10-CM

## 2023-06-02 ENCOUNTER — Ambulatory Visit: Payer: 59 | Admitting: Family Medicine

## 2023-06-02 DIAGNOSIS — G4733 Obstructive sleep apnea (adult) (pediatric): Secondary | ICD-10-CM | POA: Diagnosis not present

## 2023-06-10 ENCOUNTER — Encounter (HOSPITAL_COMMUNITY)
Admission: RE | Admit: 2023-06-10 | Discharge: 2023-06-10 | Disposition: A | Discharge status: Home / Self Care | Payer: 59 | Source: Ambulatory Visit | Attending: Gastroenterology | Admitting: Gastroenterology

## 2023-06-10 ENCOUNTER — Encounter (HOSPITAL_COMMUNITY): Payer: Self-pay

## 2023-06-10 NOTE — Pre-Procedure Instructions (Signed)
 Attempted pre-op phone call. Called [phone] and a female answered the phone and said hello and them=n started tapping on the phone like she couldn't hear me as I kept saying hello. The phone went dead and I attempted to call it back 2 more times and it was busy. I called her daughter Clayborne at [phone] and left a detailed message stating the patient did not need to come in because she just had labs we could use and left my call back number as well.

## 2023-06-12 NOTE — Telephone Encounter (Signed)
 PA not required via [facility]

## 2023-06-17 ENCOUNTER — Ambulatory Visit (HOSPITAL_COMMUNITY): Payer: 59 | Admitting: Anesthesiology

## 2023-06-17 ENCOUNTER — Ambulatory Visit (HOSPITAL_COMMUNITY)
Admission: RE | Admit: 2023-06-17 | Discharge: 2023-06-17 | Disposition: A | Discharge status: Home / Self Care | Payer: 59 | Attending: Gastroenterology | Admitting: Gastroenterology

## 2023-06-17 ENCOUNTER — Encounter (HOSPITAL_COMMUNITY)
Admission: RE | Disposition: A | Discharge status: Home / Self Care | Payer: Self-pay | Source: Home / Self Care | Attending: Gastroenterology

## 2023-06-17 ENCOUNTER — Other Ambulatory Visit: Payer: Self-pay

## 2023-06-17 DIAGNOSIS — G4730 Sleep apnea, unspecified: Secondary | ICD-10-CM | POA: Diagnosis not present

## 2023-06-17 DIAGNOSIS — K5730 Diverticulosis of large intestine without perforation or abscess without bleeding: Secondary | ICD-10-CM

## 2023-06-17 DIAGNOSIS — J449 Chronic obstructive pulmonary disease, unspecified: Secondary | ICD-10-CM | POA: Diagnosis not present

## 2023-06-17 DIAGNOSIS — D120 Benign neoplasm of cecum: Secondary | ICD-10-CM | POA: Insufficient documentation

## 2023-06-17 DIAGNOSIS — K219 Gastro-esophageal reflux disease without esophagitis: Secondary | ICD-10-CM | POA: Insufficient documentation

## 2023-06-17 DIAGNOSIS — Z794 Long term (current) use of insulin: Secondary | ICD-10-CM | POA: Insufficient documentation

## 2023-06-17 DIAGNOSIS — Z87891 Personal history of nicotine dependence: Secondary | ICD-10-CM

## 2023-06-17 DIAGNOSIS — K589 Irritable bowel syndrome without diarrhea: Secondary | ICD-10-CM | POA: Diagnosis not present

## 2023-06-17 DIAGNOSIS — K635 Polyp of colon: Secondary | ICD-10-CM

## 2023-06-17 DIAGNOSIS — F319 Bipolar disorder, unspecified: Secondary | ICD-10-CM | POA: Insufficient documentation

## 2023-06-17 DIAGNOSIS — Z7985 Long-term (current) use of injectable non-insulin antidiabetic drugs: Secondary | ICD-10-CM | POA: Insufficient documentation

## 2023-06-17 DIAGNOSIS — I10 Essential (primary) hypertension: Secondary | ICD-10-CM | POA: Insufficient documentation

## 2023-06-17 DIAGNOSIS — Z1211 Encounter for screening for malignant neoplasm of colon: Secondary | ICD-10-CM | POA: Diagnosis not present

## 2023-06-17 DIAGNOSIS — E1142 Type 2 diabetes mellitus with diabetic polyneuropathy: Secondary | ICD-10-CM | POA: Insufficient documentation

## 2023-06-17 DIAGNOSIS — D123 Benign neoplasm of transverse colon: Secondary | ICD-10-CM | POA: Diagnosis not present

## 2023-06-17 DIAGNOSIS — R933 Abnormal findings on diagnostic imaging of other parts of digestive tract: Secondary | ICD-10-CM

## 2023-06-17 DIAGNOSIS — D122 Benign neoplasm of ascending colon: Secondary | ICD-10-CM | POA: Diagnosis not present

## 2023-06-17 DIAGNOSIS — G8929 Other chronic pain: Secondary | ICD-10-CM | POA: Insufficient documentation

## 2023-06-17 DIAGNOSIS — R109 Unspecified abdominal pain: Secondary | ICD-10-CM

## 2023-06-17 HISTORY — PX: POLYPECTOMY: SHX5525

## 2023-06-17 HISTORY — PX: COLONOSCOPY WITH PROPOFOL: SHX5780

## 2023-06-17 LAB — GLUCOSE, CAPILLARY
Glucose-Capillary: 151 mg/dL — ABNORMAL HIGH (ref 70–99)
Glucose-Capillary: 62 mg/dL — ABNORMAL LOW (ref 70–99)

## 2023-06-17 LAB — HM COLONOSCOPY

## 2023-06-17 SURGERY — COLONOSCOPY WITH PROPOFOL
Anesthesia: General

## 2023-06-17 MED ORDER — LIDOCAINE HCL (CARDIAC) PF 100 MG/5ML IV SOSY
PREFILLED_SYRINGE | INTRAVENOUS | Status: DC | PRN
  Administered 2023-06-17: 80 mg via INTRAVENOUS

## 2023-06-17 MED ORDER — PHENYLEPHRINE 80 MCG/ML (10ML) SYRINGE FOR IV PUSH (FOR BLOOD PRESSURE SUPPORT)
PREFILLED_SYRINGE | INTRAVENOUS | Status: DC | PRN
  Administered 2023-06-17 (×2): 80 ug via INTRAVENOUS

## 2023-06-17 MED ORDER — HYOSCYAMINE SULFATE 0.125 MG SL SUBL: 0.1250 mg | SUBLINGUAL_TABLET | Freq: Three times a day (TID) | SUBLINGUAL | 2 refills | Status: DC | PRN

## 2023-06-17 MED ORDER — OMEPRAZOLE 40 MG PO CPDR: 40.0000 mg | DELAYED_RELEASE_CAPSULE | Freq: Every day | ORAL | 3 refills | Status: AC

## 2023-06-17 MED ORDER — LACTATED RINGERS IV SOLN
INTRAVENOUS | Status: DC
Start: 2023-06-17 — End: 2023-06-17

## 2023-06-17 MED ORDER — EPHEDRINE SULFATE-NACL 50-0.9 MG/10ML-% IV SOSY
PREFILLED_SYRINGE | INTRAVENOUS | Status: DC | PRN
  Administered 2023-06-17 (×2): 10 mg via INTRAVENOUS

## 2023-06-17 MED ORDER — PROPOFOL 500 MG/50ML IV EMUL
INTRAVENOUS | Status: DC | PRN
  Administered 2023-06-17: 200 ug/kg/min via INTRAVENOUS

## 2023-06-17 MED ORDER — PROPOFOL 10 MG/ML IV BOLUS
INTRAVENOUS | Status: DC | PRN
  Administered 2023-06-17: 50 mg via INTRAVENOUS

## 2023-06-17 NOTE — Anesthesia Preprocedure Evaluation (Signed)
 Anesthesia Evaluation  Patient identified by MRN, date of birth, ID band Patient awake    Reviewed: Allergy & Precautions, H&P , NPO status , Patient's Chart, lab work & pertinent test results, reviewed documented beta blocker date and time   History of Anesthesia Complications (+) Family history of anesthesia reaction and history of anesthetic complications  Airway Mallampati: II  TM Distance: >3 FB Neck ROM: full    Dental no notable dental hx.    Pulmonary sleep apnea , COPD, former smoker   Pulmonary exam normal breath sounds clear to auscultation       Cardiovascular Exercise Tolerance: Good hypertension,  Rhythm:regular Rate:Normal     Neuro/Psych  Headaches PSYCHIATRIC DISORDERS Anxiety Depression Bipolar Disorder    Neuromuscular disease    GI/Hepatic Neg liver ROS, PUD,GERD  ,,  Endo/Other  diabetes    Renal/GU Renal disease  negative genitourinary   Musculoskeletal   Abdominal   Peds  Hematology negative hematology ROS (+)   Anesthesia Other Findings   Reproductive/Obstetrics negative OB ROS                             Anesthesia Physical Anesthesia Plan  ASA: 3  Anesthesia Plan: General   Post-op Pain Management:    Induction:   PONV Risk Score and Plan: Propofol  infusion  Airway Management Planned:   Additional Equipment:   Intra-op Plan:   Post-operative Plan:   Informed Consent: I have reviewed the patients History and Physical, chart, labs and discussed the procedure including the risks, benefits and alternatives for the proposed anesthesia with the patient or authorized representative who has indicated his/her understanding and acceptance.     Dental Advisory Given  Plan Discussed with: CRNA  Anesthesia Plan Comments:        Anesthesia Quick Evaluation

## 2023-06-17 NOTE — Transfer of Care (Signed)
 Immediate Anesthesia Transfer of Care Note  Patient: Cynthia Deleon  Procedure(s) Performed: COLONOSCOPY WITH PROPOFOL  POLYPECTOMY  Patient Location: Short Stay  Anesthesia Type:General  Level of Consciousness: awake, alert , oriented, and patient cooperative  Airway & Oxygen  Therapy: Patient Spontanous Breathing  Post-op Assessment: Report given to RN, Post -op Vital signs reviewed and stable, and Patient moving all extremities X 4  Post vital signs: Reviewed and stable  Last Vitals:  Vitals Value Taken Time  BP 101/54 06/17/23 1445  Temp 36.4 C 06/17/23 1445  Pulse 95 06/17/23 1445  Resp 15 06/17/23 1445  SpO2 96 % 06/17/23 1445    Last Pain:  Vitals:   06/17/23 1445  TempSrc: Oral  PainSc: 0-No pain         Complications: No notable events documented.

## 2023-06-17 NOTE — H&P (Signed)
 Cynthia Deleon is an 68 y.o. female.   Chief Complaint: abdominal pain HPI: Cynthia Deleon is a 68 y.o. female with past medical history of IBS, bipolar disorder, chronic back pain, depression, diabetes, COPD, GERD, hypertension, panic attacks, sleep apnea, incontinence, who presents for evaluation of abdominal pain and possible colitis.   Patient reports that she has presented recurrent episodes of abdominal pain diffusely but not recently.  No nausea, vomiting, fever or chills.  No melena or hematochezia.  Last CT scan was performed on 04/15/2023 which showed possible colitis in the descending and sigmoid colon.  Past Medical History:  Diagnosis Date   Aortic atherosclerosis (HCC) 10/31/2021   Arthritis    hands and knees   Bipolar 1 disorder (HCC)    Borderline diabetic    Chronic back pain    Chronic neck pain    Complication of anesthesia    high anxiety-does not want to be alone   COPD (chronic obstructive pulmonary disease) (HCC)    Current use of estrogen therapy 01/12/2014   Depression    Diabetes mellitus without complication (HCC)    Diplopia    Family history of adverse reaction to anesthesia    sister gas in lungs   GERD (gastroesophageal reflux disease)    Headache    Hepatic steatosis 10/31/2021   Hot flashes 12/15/2013   Hypertension    IBS (irritable bowel syndrome)    Incontinence    Kidney stone    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HCC)    Osteopenia 02/06/2022   Panic attacks    Peptic ulcer    Peripheral neuropathy    Rosacea    Shingles    Sleep apnea    uses a cpap-with oxygen     Past Surgical History:  Procedure Laterality Date   BIOPSY  04/27/2020   Procedure: BIOPSY;  Surgeon: Donnald Charleston, MD;  Location: [facility];  Service: Endoscopy;;   BUNIONECTOMY WITH HAMMERTOE RECONSTRUCTION Right 12/10/2012   Procedure: RIGHT FIRST METATARSAL CHEVRON BUNION CORRECTION,  2 AND 3 HAMMERTOE CORRECTION , RIGHT 3 AND 4 TOE NAIL EXCISION ;   Surgeon: Norleen Armor, MD;  Location: [facility];  Service: Orthopedics;  Laterality: Right;   COLONOSCOPY WITH PROPOFOL  N/A 04/27/2020   Procedure: COLONOSCOPY WITH PROPOFOL ;  Surgeon: Donnald Charleston, MD;  Location: [facility];  Service: Endoscopy;  Laterality: N/A;   FOOT ARTHRODESIS  2000   both feet   LIPOSUCTION  03/2018   RECTAL SURGERY     TONSILLECTOMY     TOTAL ABDOMINAL HYSTERECTOMY      Family History  Problem Relation Age of Onset   Diabetes Maternal Grandmother    Diabetes Maternal Grandfather    COPD Father    Diabetes Mother    Other Mother        vertigo; chronic eye disease   Alcohol  abuse Brother    Alcohol  abuse Brother    COPD Brother    Alcohol  abuse Brother    Alcohol  abuse Brother    Other Brother        aneursym   Heart attack Sister    COPD Sister    Diabetes Sister    Other Sister        hearing problems   Hyperlipidemia Sister    Breast cancer Neg Hx    Social History:  reports that she quit smoking about 6 years ago. Her smoking use included cigarettes. She started smoking about 48 years ago. She has a 42.3 pack-year  smoking history. She has never used smokeless tobacco. She reports that she does not drink alcohol  and does not use drugs.  Allergies:  Allergies  Allergen Reactions   Divalproex Sodium Other (See Comments)    Hallucinations   Other reaction(s): Confusion  Hallucinations     Other reaction(s): Confusion    Hallucinations   Other Anaphylaxis, Other (See Comments), Nausea And Vomiting and Nausea Only   Pseudoeph-Hydrocodone -Gg Nausea Only   Sulfa Antibiotics Anaphylaxis, Nausea Only, Other (See Comments) and Swelling    Swelling of tongue.   Valproic Acid Other (See Comments)   Varenicline Other (See Comments)    Altered mental status-Per patient was put on allergy list by Dr Todd Cliche bu patient staes she is not allergic to this and is currently taking it.   Varenicline Tartrate Other (See Comments)     Other reaction(s): Confusion   Codeine Nausea And Vomiting and Other (See Comments)    Other reaction(s): vomiting   Hydrocodone  Nausea And Vomiting and Other (See Comments)   Ambien  [Zolpidem ] Other (See Comments)    Causes sleep walking   Clavulanic Acid Diarrhea   Elemental Sulfur Other (See Comments)    Swelling of tongue   Hydrocod Poli-Chlorphe Poli Er Other (See Comments)    Other reaction(s): Skin itches    Macrobid  [Nitrofurantoin ] Nausea And Vomiting    Dizziness, nausea, vomiting   Paxil [Paroxetine Hcl] Other (See Comments)    Causes ringing in the ears.    Prednisone  Other (See Comments)    Other reaction(s): Unknown    Amoxicillin  Rash    Has patient had a PCN reaction causing immediate rash, facial/tongue/throat swelling, SOB or lightheadedness with hypotension: No Has patient had a PCN reaction causing severe rash involving mucus membranes or skin necrosis: No Has patient had a PCN reaction that required hospitalization: No Has patient had a PCN reaction occurring within the last 10 years: Yes If all of the above answers are NO, then may proceed with Cephalosporin use.    Farxiga  [Dapagliflozin ]     RECURRENT YEAST/UTI   Metformin  And Related Diarrhea   Tape Rash    Medications Prior to Admission  Medication Sig Dispense Refill   ARIPiprazole  (ABILIFY ) 10 MG tablet Take 10 mg by mouth every morning.     Artificial Saliva (BIOTENE DRY MOUTH MOISTURIZING) SOLN Use as directed 5 mLs in the mouth or throat in the morning and at bedtime. 44.3 mL 0   cholecalciferol  (VITAMIN D ) 25 MCG tablet Take 1 tablet (1,000 Units total) by mouth daily.     diazepam  (VALIUM ) 5 MG tablet Take 5 mg by mouth 2 (two) times daily.     dicyclomine  (BENTYL ) 10 MG capsule Take 1 capsule (10 mg total) by mouth every 12 (twelve) hours as needed. 180 capsule 1   lamoTRIgine  (LAMICTAL ) 200 MG tablet Take 200 mg by mouth at bedtime. For mood disorder.     lansoprazole  (PREVACID ) 30 MG  capsule TAKE (1) CAPSULE BY MOUTH ONCE DAILY. 30 capsule 5   levocetirizine (XYZAL ) 5 MG tablet Take 1 tablet (5 mg total) by mouth every evening. 30 tablet 6   lisinopril  (ZESTRIL ) 5 MG tablet TAKE (1) TABLET BY MOUTH ONCE DAILY. 90 tablet 0   montelukast  (SINGULAIR ) 10 MG tablet TAKE (1) TABLET BY MOUTH AT BEDTIME. 90 tablet 1   MYRBETRIQ  50 MG TB24 tablet Take 1 tablet (50 mg total) by mouth daily. **NEEDS TO BE SEEN BEFORE NEXT REFILL** 30 tablet 0  oxyCODONE  (ROXICODONE ) 15 MG immediate release tablet Take 1 tablet (15 mg total) by mouth every 6 (six) hours as needed for pain. 10 tablet 0   pregabalin  (LYRICA ) 150 MG capsule TAKE 1 CAPSULE BY MOUTH THREE TIMES A DAY. 90 capsule 0   vitamin B-12 1000 MCG tablet Take 1 tablet (1,000 mcg total) by mouth daily.     VRAYLAR 3 MG capsule Take 3 mg by mouth at bedtime.     albuterol  (VENTOLIN  HFA) 108 (90 Base) MCG/ACT inhaler INHALE 2PUFFS EVERY 6 HOURS AS NEEDED. 8.5 g 0   aspirin EC 81 MG tablet Take 81 mg by mouth daily. Swallow whole. (Patient not taking: Reported on 05/05/2023)     atorvastatin  (LIPITOR) 80 MG tablet Take 1 tablet (80 mg total) by mouth daily. (Patient taking differently: Take 80 mg by mouth daily. Not taking) 90 tablet 3   Continuous Glucose Sensor (FREESTYLE LIBRE 2 SENSOR) MISC Apply to arm every 14 days to check blood sugar continuously. DX E11.65 2 each 11   DULoxetine  (CYMBALTA ) 60 MG capsule TAKE (1) CAPSULE BY MOUTH EVERY DAY. (Patient taking differently: Not taking) 30 capsule 0   estrogens , conjugated, (PREMARIN ) 0.3 MG tablet TAKE (1) TABLET BY MOUTH ONCE DAILY. 30 tablet 10   ezetimibe  (ZETIA ) 10 MG tablet TAKE (1) TABLET BY MOUTH ONCE DAILY. 30 tablet 5   fluorometholone (FML) 0.1 % ophthalmic suspension SMARTSIG:In Eye(s)     fluticasone  (FLONASE ) 50 MCG/ACT nasal spray USE 2 SPRAYS IN EACH NOSTRIL ONCE DAILY. (Patient taking differently: Place 2 sprays into both nostrils daily.) 16 g 0   insulin  lispro (HUMALOG   KWIKPEN) 100 UNIT/ML KwikPen Use 5-10 units 2 times daily with breakfast and dinner when blood sugar is >200 on steroids (Patient not taking: Reported on 04/01/2023) 15 mL 11   Insulin  Pen Needle (PIP PEN NEEDLES 32G X ) 32G X 4 MM MISC Use to inject insulin  2 times daily as needed. DX E11.65 (Patient not taking: Reported on 05/05/2023) 100 each 11   meclizine  (ANTIVERT ) 25 MG tablet Take 2 tablets (50 mg total) by mouth daily as needed for dizziness. (Patient not taking: Reported on 05/05/2023) 60 tablet 2   OneTouch Delica Lancets 30G MISC Use to test blood sugar twice daily. DX: E11.9 100 each 3   promethazine  (PHENERGAN ) 12.5 MG tablet Take 1 tablet (12.5 mg total) by mouth every 8 (eight) hours as needed for nausea or vomiting. 20 tablet 0   tirzepatide  (MOUNJARO ) 10 MG/0.5ML Pen Inject 10 mg into the skin once a week. 6 mL 3   umeclidinium-vilanterol (ANORO ELLIPTA ) 62.5-25 MCG/ACT AEPB Inhale 1 puff into the lungs daily. 60 each 5   valACYclovir  (VALTREX ) 1000 MG tablet Take 1 tablet (1,000 mg total) by mouth daily. 30 tablet 12    Results for orders placed or performed during the hospital encounter of 06/17/23 (from the past 48 hours)  Glucose, capillary     Status: Abnormal   Collection Time: 06/17/23 12:13 PM  Result Value Ref Range   Glucose-Capillary 151 (H) 70 - 99 mg/dL    Comment: Glucose reference range applies only to samples taken after fasting for at least 8 hours.   No results found.  Review of Systems  Gastrointestinal:  Positive for abdominal pain.  All other systems reviewed and are negative.   Blood pressure 128/62, pulse (!) 102, resp. rate 15, height 5' 9 (1.753 m), weight 74.8 kg, SpO2 96%. Physical Exam  GENERAL: The patient is  AO x3, in no acute distress. HEENT: Head is normocephalic and atraumatic. EOMI are intact. Mouth is well hydrated and without lesions. NECK: Supple. No masses LUNGS: Clear to auscultation. No presence of rhonchi/wheezing/rales.  Adequate chest expansion HEART: RRR, normal s1 and s2. ABDOMEN: Soft, nontender, no guarding, no peritoneal signs, and nondistended. BS +. No masses. EXTREMITIES: Without any cyanosis, clubbing, rash, lesions or edema. NEUROLOGIC: AOx3, no focal motor deficit. SKIN: no jaundice, no rashes   Assessment/Plan Cynthia Deleon is a 68 y.o. female with past medical history of IBS, bipolar disorder, chronic back pain, depression, diabetes, COPD, GERD, hypertension, panic attacks, sleep apnea, incontinence, who presents for evaluation of abdominal pain and possible colitis.  Will proceed with colonoscopy.  Toribio Eartha Flavors, MD 06/17/2023, 1:27 PM

## 2023-06-17 NOTE — Op Note (Signed)
 [facility] Patient Name: Cynthia Deleon Procedure Date: 06/17/2023 1:45 PM MRN: [id number] Date of Birth: 01-22-1956 Attending MD: Toribio Fortune , , [id number] CSN: [id number] Age: 68 Admit Type: Outpatient Procedure:                Colonoscopy Indications:              Abdominal pain Providers:                Toribio Fortune, Leandrew Edelman RN, RN, Jon Loge Referring MD:              Medicines:                Monitored Anesthesia Care Complications:            No immediate complications. Estimated Blood Loss:     Estimated blood loss: none. Procedure:                Pre-Anesthesia Assessment:                           - Prior to the procedure, a History and Physical                            was performed, and patient medications, allergies                            and sensitivities were reviewed. The patient's                            tolerance of previous anesthesia was reviewed.                           - The risks and benefits of the procedure and the                            sedation options and risks were discussed with the                            patient. All questions were answered and informed                            consent was obtained.                           After obtaining informed consent, the colonoscope                            was passed under direct vision. Throughout the                            procedure, the patient's blood pressure, pulse, and                            oxygen  saturations were monitored continuously. The  [id number]-[id number] ([id number]) scope was introduced through                            the anus and advanced to the the cecum, identified                            by appendiceal orifice and ileocecal valve. The                            patient tolerated the procedure well. The                            colonoscopy was somewhat difficult due to a                             tortuous colon. The quality of the bowel                            preparation was adequate to identify polyps greater                            than 5 mm in size. Scope In: 2:02:31 PM Scope Out: 2:39:09 PM Scope Withdrawal Time: 0 hours 17 minutes 1 second  Total Procedure Duration: 0 hours 36 minutes 38 seconds  Findings:      The perianal and digital rectal examinations were normal. Pertinent       negatives include normal sphincter tone.      A 1 mm polyp was found in the cecum. The polyp was sessile. The polyp       was removed with a cold biopsy forceps. Resection and retrieval were       complete.      A 1 mm polyp was found in the ascending colon. The polyp was sessile.       The polyp was removed with a cold biopsy forceps. Resection and       retrieval were complete.      Two sessile polyps were found in the transverse colon and ascending       colon. The polyps were 3 to 4 mm in size. These polyps were removed with       a cold snare. Resection and retrieval were complete.      Multiple small-mouthed diverticula were found in the sigmoid colon,       descending colon and ascending colon.      The sigmoid colon was moderately tortuous.      The retroflexed view of the distal rectum and anal verge was normal and       showed no anal or rectal abnormalities. Impression:               - One 1 mm polyp in the cecum, removed with a cold                            biopsy forceps. Resected and retrieved.                           - One 1 mm polyp in the ascending colon, removed  with a cold biopsy forceps. Resected and retrieved.                           - Two 3 to 4 mm polyps in the transverse colon and                            in the ascending colon, removed with a cold snare.                            Resected and retrieved.                           - Diverticulosis in the sigmoid colon, in the                            descending  colon and in the ascending colon.                           - Tortuous colon.                           - The distal rectum and anal verge are normal on                            retroflexion view. Moderate Sedation:      Per Anesthesia Care Recommendation:           - Discharge patient to home (ambulatory).                           - Resume previous diet.                           - Await pathology results.                           - Repeat colonoscopy for surveillance based on                            pathology results. Procedure Code(s):        --- Professional ---                           [phone], Colonoscopy, flexible; with removal of                            tumor(s), polyp(s), or other lesion(s) by snare                            technique                           [id number], 59, Colonoscopy, flexible; with biopsy,                            single or multiple Diagnosis Code(s):        ---  Professional ---                           D12.0, Benign neoplasm of cecum                           D12.2, Benign neoplasm of ascending colon                           D12.3, Benign neoplasm of transverse colon (hepatic                            flexure or splenic flexure)                           R10.9, Unspecified abdominal pain                           K57.30, Diverticulosis of large intestine without                            perforation or abscess without bleeding                           Q43.8, Other specified congenital malformations of                            intestine CPT copyright 2022 [employer]. All rights reserved. The codes documented in this report are preliminary and upon coder review may  be revised to meet current compliance requirements. Toribio Fortune, MD Toribio Fortune,  06/17/2023 2:47:00 PM This report has been signed electronically. Number of Addenda: 0

## 2023-06-17 NOTE — Discharge Instructions (Signed)
 You are being discharged to home.  Resume your previous diet.  We are waiting for your pathology results.  Your physician has recommended a repeat colonoscopy for surveillance based on pathology results.

## 2023-06-19 ENCOUNTER — Ambulatory Visit (INDEPENDENT_AMBULATORY_CARE_PROVIDER_SITE_OTHER): Payer: 59 | Admitting: Pharmacist

## 2023-06-19 DIAGNOSIS — J441 Chronic obstructive pulmonary disease with (acute) exacerbation: Secondary | ICD-10-CM

## 2023-06-19 LAB — SURGICAL PATHOLOGY

## 2023-06-19 MED ORDER — ALBUTEROL SULFATE HFA 108 (90 BASE) MCG/ACT IN AERS: 1.0000 | INHALATION_SPRAY | Freq: Four times a day (QID) | RESPIRATORY_TRACT | 2 refills | Status: DC | PRN

## 2023-06-19 NOTE — Progress Notes (Signed)
 06/19/2023 Name: Cynthia Deleon MRN: [id number] DOB: Oct 25, 1955  Chief Complaint  Patient presents with   Diabetes    Cynthia Deleon is a 68 y.o. year old female who was referred for medication management by their primary care provider, Cynthia Deleon. They presented for a face to face visit today.   They were referred to the pharmacist by their PCP for assistance in managing diabetes and hyperlipidemia   Subjective:  Care Team: Primary Care Provider: Severa Rock HERO, Deleon  Medication Access/Adherence  Current Pharmacy:  [facility], [place] - [phone] [place] [address] Phone: [phone] Fax: [phone]  Cynthia Deleon, [place] - [address] [place] [address] Phone: [phone] Fax: [phone]   Patient reports affordability concerns with their medications: No  Patient reports access/transportation concerns to their pharmacy: No  Patient reports adherence concerns with their medications:  No     Diabetes:  Current medications: Mounjaro  10mg  weekly Medications tried in the past: januvia , glipizide , metformin , trulicity   Current glucose readings: FBG<130 Using FSL2 CGM  Date of Download: 06/19/23 % Time CGM is active: 58% Average Glucose: 112 mg/dL Glucose Management Indicator: 6.0  Glucose Variability: 20.3 (goal <36%) Time in Goal:  - Time in range 70-180: 97% - Time above range: 3% - Time below range: 0% Observed patterns:   Patient denies hypoglycemic s/sx including dizziness, shakiness, sweating. Patient denies hyperglycemic symptoms including polyuria, polydipsia, polyphagia, nocturia, neuropathy, blurred vision.  Current meal patterns:  Discussed meal planning options and Plate method for healthy eating Avoid sugary drinks and desserts Incorporate balanced protein, non starchy veggies, 1 serving of carbohydrate with each meal Increase  water intake Increase physical activity as able  Current physical activity: encouraged as able  Current medication access support: medicare/caid   Objective:  Lab Results  Component Value Date   HGBA1C 6.3 (H) 02/25/2023    Lab Results  Component Value Date   CREATININE 0.66 04/15/2023   BUN 11 04/15/2023   NA 134 (L) 04/15/2023   K 4.0 04/15/2023   CL 101 04/15/2023   CO2 24 04/15/2023    Lab Results  Component Value Date   CHOL 137 11/22/2022   HDL 46 11/22/2022   LDLCALC 66 11/22/2022   TRIG 141 11/22/2022   CHOLHDL 3.0 11/22/2022    Medications Reviewed Today     Reviewed by Cynthia Deleon, [facility] (Pharmacist) on 07/04/23 at 1345  Med List Status: <None>   Medication Order Taking? Sig Documenting Provider Last Dose Status Informant  albuterol  (VENTOLIN  HFA) 108 (90 Base) MCG/ACT inhaler [id number] No Inhale 1-2 puffs into the lungs every 6 (six) hours as needed for wheezing or shortness of breath. Cynthia Rock HERO, Deleon Taking Active   ARIPiprazole  (ABILIFY ) 10 MG tablet [id number] No Take 10 mg by mouth every morning. Provider, Historical, Deleon Taking Active   Artificial Saliva (BIOTENE DRY MOUTH MOISTURIZING) SOLN [id number] No Use as directed 5 mLs in the mouth or throat in the morning and at bedtime. Cynthia Rock HERO, Deleon Taking Active   aspirin EC 81 MG tablet [id number] No Take 81 mg by mouth daily. Swallow whole. Provider, Historical, Deleon Taking Active   atorvastatin  (LIPITOR) 80 MG tablet [id number] No Take 1 tablet (80 mg total) by mouth daily.  Patient taking differently: Take 80 mg by mouth daily. Not taking   Cynthia Deleon Taking Active   cholecalciferol  (VITAMIN  D) 25 MCG tablet [id number] No Take 1 tablet (1,000 Units total) by mouth daily. Cynthia Toribio SQUIBB, Deleon Taking Active Self, Pharmacy Records  Continuous Glucose Sensor (FREESTYLE LIBRE 2 SENSOR) [place] [id number] No Apply to arm every 14 days to check blood sugar continuously. DX E11.65 Cynthia Rock HERO,  Deleon Taking Active   diazepam  (VALIUM ) 5 MG tablet [id number] No Take 5 mg by mouth 2 (two) times daily. Provider, Historical, Deleon Taking Active   doxycycline  (VIBRA -TABS) 100 MG tablet [id number]  Take 1 tablet (100 mg total) by mouth 2 (two) times daily. Cynthia Almarie ORN, NP  Active   DULoxetine  (CYMBALTA ) 60 MG capsule [id number] No TAKE (1) CAPSULE BY MOUTH EVERY DAY.  Patient taking differently: Not taking   Cynthia Deleon Taking Active   estrogens , conjugated, (PREMARIN ) 0.3 MG tablet [id number] No TAKE (1) TABLET BY MOUTH ONCE DAILY. Cynthia Rock HERO, Deleon Taking Active   ezetimibe  (ZETIA ) 10 MG tablet [id number] No TAKE (1) TABLET BY MOUTH ONCE DAILY. Cynthia Deleon Taking Active   fluconazole  (DIFLUCAN ) 150 MG tablet [id number]  May repeat in 72 hours if needed Cynthia Almarie ORN, NP  Active   fluorometholone (FML) 0.1 % ophthalmic suspension [id number] No SMARTSIG:In Eye(s) Provider, Historical, Deleon Taking Active   fluticasone  (FLONASE ) 50 MCG/ACT nasal spray [id number] No USE 2 SPRAYS IN EACH NOSTRIL ONCE DAILY.  Patient taking differently: Place 2 sprays into both nostrils daily.   Cynthia Niki FALCON, Deleon Taking Active Self, Pharmacy Records  hyoscyamine  (LEVSIN  SL) 0.125 MG SL tablet [id number] No Place 1 tablet (0.125 mg total) under the tongue every 8 (eight) hours as needed (abdominal pain). Cynthia Deleon Taking Active   insulin  lispro (HUMALOG  [facility]) 100 UNIT/ML KwikPen [id number] No Use 5-10 units 2 times daily with breakfast and dinner when blood sugar is >200 on steroids Cynthia Deleon Taking Active   Insulin  Pen Needle (PIP PEN NEEDLES 32G X ) 32G X 4 MM MISC [id number] No Use to inject insulin  2 times daily as needed. DX E11.65 Cynthia Rock HERO, Deleon Taking Active   lamoTRIgine  (LAMICTAL ) 200 MG tablet [id number] No Take 200 mg by mouth at bedtime. For mood disorder. Provider, Historical, Deleon Taking Active Self, Pharmacy Records  levocetirizine (XYZAL ) 5 MG  tablet [id number] No Take 1 tablet (5 mg total) by mouth every evening. Cynthia Rock HERO, Deleon Taking Active   lisinopril  (ZESTRIL ) 5 MG tablet [id number] No TAKE (1) TABLET BY MOUTH ONCE DAILY. Cynthia Rock HERO, Deleon Taking Active   meclizine  (ANTIVERT ) 25 MG tablet [id number] No Take 2 tablets (50 mg total) by mouth daily as needed for dizziness. Cynthia Niki FALCON, Deleon Taking Active Self, Pharmacy Records  montelukast  (SINGULAIR ) 10 MG tablet [id number]  TAKE (1) TABLET BY MOUTH AT BEDTIME. Cynthia Almarie ORN, NP  Active   MYRBETRIQ  50 MG TB24 tablet [id number] No Take 1 tablet (50 mg total) by mouth daily. **NEEDS TO BE SEEN BEFORE NEXT REFILL** Cynthia Deleon Taking Active   omeprazole  (PRILOSEC) 40 MG capsule [id number] No Take 1 capsule (40 mg total) by mouth daily. Cynthia Mayorga, Daniel, Deleon Taking Active   [facility] Lancets 30G [place] [id number] No Use to test blood sugar twice daily. DX: E11.9 Cynthia Niki FALCON, Deleon Taking Active Self, Pharmacy Records  oxyCODONE  (ROXICODONE ) 15 MG immediate release tablet [id number] No Take 1 tablet (15 mg total) by mouth every 6 (six) hours as needed for pain. Maree Bracken D, DO Taking Active  pregabalin  (LYRICA ) 150 MG capsule [id number] No TAKE 1 CAPSULE BY MOUTH THREE TIMES A DAY. Cynthia Rock HERO, Deleon Taking Active   promethazine  (PHENERGAN ) 12.5 MG tablet [id number] No Take 1 tablet (12.5 mg total) by mouth every 8 (eight) hours as needed for nausea or vomiting. Cynthia Rock HERO, Deleon Taking Active   tirzepatide  (MOUNJARO ) 10 MG/0.5ML Pen [id number] No Inject 10 mg into the skin once a week. Cynthia Rock HERO, Deleon Taking Active   umeclidinium-vilanterol (ANORO ELLIPTA ) 62.5-25 MCG/ACT AEPB [id number]  Inhale 1 puff into the lungs daily. Cynthia Almarie ORN, NP  Active   valACYclovir  (VALTREX ) 1000 MG tablet [id number] No Take 1 tablet (1,000 mg total) by mouth daily. Signa Delon LABOR, NP Taking Active   vitamin B-12 1000 MCG tablet [id number] No Take 1 tablet (1,000  mcg total) by mouth daily. Cynthia Toribio SQUIBB, Deleon Taking Active Self, Pharmacy Records           Med Note WORLEY, STEPHANE GORMAN [place] Nov 21, 2021  3:25 PM) Pt has not started yet  VRAYLAR 3 MG capsule [id number] No Take 3 mg by mouth at bedtime. Provider, Historical, Deleon Taking Active   Med List Note [place] Rock Deleon, [place] 05/22/22 1546): Not Rx'd by PCP: sertraline, valium , lamotrigine , and oxycodone , lyrica - [facility] 12/02/19.              Assessment/Plan:   Diabetes: - Currently controlled - Reviewed long term cardiovascular and renal outcomes of uncontrolled blood sugar - Reviewed goal A1c, goal fasting, and goal 2 hour post prandial glucose - Reviewed dietary modifications including FOLLOWING A HEART HEALTHY DIET/HEALTHY PLATE METHOD - Reviewed lifestyle modifications including: - Recommend to : continue current therapy Encouraged more scanning of libre 2 CGM Will need to transition patient to [place] PLUS due to product discontinuation  - Patient denies personal or family history of multiple endocrine neoplasia type 2, medullary thyroid  cancer; personal history of pancreatitis or gallbladder disease. - Recommend to check glucose using libre 2  Follow Up Plan: 3 months   Mliss Tarry Griffin, PharmD, BCACP, CPP Clinical Pharmacist, [facility]

## 2023-06-19 NOTE — Anesthesia Postprocedure Evaluation (Signed)
 Anesthesia Post Note  Patient: Cynthia Deleon  Procedure(s) Performed: COLONOSCOPY WITH PROPOFOL  POLYPECTOMY  Patient location during evaluation: Phase II Anesthesia Type: General Level of consciousness: awake Pain management: pain level controlled Vital Signs Assessment: post-procedure vital signs reviewed and stable Respiratory status: spontaneous breathing and respiratory function stable Cardiovascular status: blood pressure returned to baseline and stable Postop Assessment: no headache and no apparent nausea or vomiting Anesthetic complications: no Comments: Late entry   No notable events documented.   Last Vitals:  Vitals:   06/17/23 1227 06/17/23 1445  BP: 128/62 (!) 101/54  Pulse: (!) 102 95  Resp: 15 15  Temp:  (!) 36.4 C  SpO2: 96% 96%    Last Pain:  Vitals:   06/17/23 1445  TempSrc: Oral  PainSc: 0-No pain                 Yvonna JINNY Bosworth

## 2023-06-20 ENCOUNTER — Encounter (INDEPENDENT_AMBULATORY_CARE_PROVIDER_SITE_OTHER): Payer: Self-pay | Admitting: *Deleted

## 2023-06-20 ENCOUNTER — Encounter (HOSPITAL_COMMUNITY): Payer: Self-pay | Admitting: Gastroenterology

## 2023-06-21 DIAGNOSIS — E1142 Type 2 diabetes mellitus with diabetic polyneuropathy: Secondary | ICD-10-CM | POA: Diagnosis not present

## 2023-06-24 ENCOUNTER — Encounter (INDEPENDENT_AMBULATORY_CARE_PROVIDER_SITE_OTHER): Payer: Self-pay | Admitting: *Deleted

## 2023-06-24 DIAGNOSIS — H905 Unspecified sensorineural hearing loss: Secondary | ICD-10-CM | POA: Diagnosis not present

## 2023-06-29 ENCOUNTER — Other Ambulatory Visit: Payer: Self-pay | Admitting: Family Medicine

## 2023-06-29 DIAGNOSIS — E782 Mixed hyperlipidemia: Secondary | ICD-10-CM

## 2023-06-30 ENCOUNTER — Other Ambulatory Visit (HOSPITAL_COMMUNITY): Payer: Self-pay

## 2023-06-30 ENCOUNTER — Other Ambulatory Visit: Payer: Self-pay | Admitting: Family Medicine

## 2023-06-30 ENCOUNTER — Ambulatory Visit: Payer: 59 | Admitting: Primary Care

## 2023-06-30 ENCOUNTER — Telehealth: Payer: Self-pay | Admitting: Primary Care

## 2023-06-30 ENCOUNTER — Telehealth: Payer: Self-pay | Admitting: Pharmacy Technician

## 2023-06-30 VITALS — BP 136/86 | HR 119 | Ht 69.0 in | Wt 167.0 lb

## 2023-06-30 DIAGNOSIS — G4733 Obstructive sleep apnea (adult) (pediatric): Secondary | ICD-10-CM | POA: Diagnosis not present

## 2023-06-30 DIAGNOSIS — E11649 Type 2 diabetes mellitus with hypoglycemia without coma: Secondary | ICD-10-CM

## 2023-06-30 DIAGNOSIS — J4489 Other specified chronic obstructive pulmonary disease: Secondary | ICD-10-CM

## 2023-06-30 DIAGNOSIS — Z87891 Personal history of nicotine dependence: Secondary | ICD-10-CM

## 2023-06-30 DIAGNOSIS — G4734 Idiopathic sleep related nonobstructive alveolar hypoventilation: Secondary | ICD-10-CM

## 2023-06-30 DIAGNOSIS — J309 Allergic rhinitis, unspecified: Secondary | ICD-10-CM | POA: Diagnosis not present

## 2023-06-30 DIAGNOSIS — J0110 Acute frontal sinusitis, unspecified: Secondary | ICD-10-CM | POA: Diagnosis not present

## 2023-06-30 MED ORDER — MONTELUKAST SODIUM 10 MG PO TABS: ORAL_TABLET | ORAL | 3 refills | Status: AC

## 2023-06-30 MED ORDER — DOXYCYCLINE HYCLATE 100 MG PO TABS: 100.0000 mg | ORAL_TABLET | Freq: Two times a day (BID) | ORAL | 0 refills | Status: DC

## 2023-06-30 MED ORDER — ANORO ELLIPTA 62.5-25 MCG/ACT IN AEPB: 1.0000 | INHALATION_SPRAY | Freq: Every day | RESPIRATORY_TRACT | 5 refills | Status: DC

## 2023-06-30 MED ORDER — FLUCONAZOLE 150 MG PO TABS
ORAL_TABLET | ORAL | 0 refills | Status: DC
Start: 2023-06-30 — End: 2023-08-12

## 2023-06-30 NOTE — Patient Instructions (Addendum)
 -  COPD/ASTHMA: Chronic Obstructive Pulmonary Disease (COPD) and asthma are long-term lung conditions that make it hard to breathe. You should resume taking Anoro daily for better control of your symptoms and continue using albuterol as needed. Keep taking Singulair at bedtime for your allergies.  -SINUSITIS: Sinusitis is an inflammation of the sinuses that can cause headaches and congestion. We are starting you on Singulair and azelastine to help with your sinusitis. If your symptoms persist or worsen, we may consider antibiotics.  -SLEEP APNEA: Sleep apnea is a condition where breathing stops and starts during sleep. You are doing well with your CPAP and oxygen use at night, so continue with your current management.  -TACHYCARDIA: Tachycardia is a condition where the heart beats faster than normal. Your heart rate was slightly elevated today, possibly due to caffeine and stress. Monitor your symptoms and seek emergency care if you experience chest pain or a racing heart rate. We will notify your primary care provider for further evaluation.  -TOBACCO USE: You have successfully quit smoking for 1.5 years. This is excellent progress, and you should continue to abstain from smoking.  -LUNG CANCER SCREENING: You are enrolled in a lung cancer screening program but have not had a CT scan in over three months. We will ensure that your next CT scan is scheduled and that you are notified.  INSTRUCTIONS:  Please follow up in 6 months to assess your response to Anoro and the overall management of your COPD/asthma. If you experience any worsening symptoms or new health concerns, contact us immediately.  Follow-up 6 months with [facility] NP or sooner if needed

## 2023-06-30 NOTE — Telephone Encounter (Addendum)
Last LDCT in chart was done in 2023. This is a [place] patient, can we see if she had annual scan in 2024 with [facility]- if so can we can it faxed to [place] for review

## 2023-06-30 NOTE — Telephone Encounter (Signed)
Thank you :)

## 2023-06-30 NOTE — Telephone Encounter (Signed)
Left Vm for patient to call for scheduling of her annual LDCT

## 2023-06-30 NOTE — Telephone Encounter (Signed)
Pharmacy Patient Advocate Encounter   Received notification from CoverMyMeds that prior authorization for FreeStyle Libre 2 Sensor is required/requested.   Insurance verification completed.   The patient is insured through [facility] .   Per test claim: The current 28 day co-pay is, $0.00.  No PA needed at this time. This test claim was processed through [facility]- copay amounts may vary at other pharmacies due to pharmacy/plan contracts, or as the patient moves through the different stages of their insurance plan.

## 2023-06-30 NOTE — Progress Notes (Signed)
@Patient  ID: Cynthia Deleon, female    DOB: 23-May-1956, 68 y.o.   MRN: [id number]  Chief Complaint  Patient presents with   Consult    Referring provider: Sonny Masters, FNP  HPI: 68 year old female, former smoker.  Past medical history significant for hypertension, aortic arthrosclerosis, COPD, OSA, seasonal allergic rhinitis, GERD, hepatic steatosis, type 2 diabetes, osteopenia, bipolar 1 disorder, PTSD, history of falls, polypharmacy.  Former patient of Dr. Craige Cotta.  06/30/2023 Discussed the use of AI scribe software for clinical note transcription with the patient, who gave verbal consent to proceed.  History of Present Illness   The patient, with a history of asthma, emphysema, and sleep apnea, reports cessation of smoking approximately a year and a half ago. She has been managing her respiratory conditions with a rescue inhaler as needed.  It was noted during her last office visit that she was changed from Trelegy to [facility].  Patient is not currently taking Anoro as she thought there was a steroid in it and she has a history of thrush. She expresses that her breathing could be better. She also reports daily use of Singulair or montelukast for allergies and frequent use of her albuterol rescue inhaler, approximately three times a day.  The patient has been experiencing frontal headaches, which she attributes to sinus issues. She describes the pain as constant and located across the eyes and forehead. Bright lights exacerbate the discomfort. She also reports a sensation of congestion and a runny nose, she does report getting up yellow mucus intermittently from her sinuses.  She has tried Flonase and Tylenol for relief, but these have not been effective.  Regarding her sleep apnea, the patient uses a CPAP machine and wears oxygen at night. She reports feeling better after increasing her oxygen to two liters.   The patient also has a history of tobacco use and is enrolled in a lung cancer  screening program. Last scan is from 2023, we will have lung cancer screening team contact [facility] to see if she had her annual 2024 scan with them (not linked with Epic).     Airview download 05/28/23-06/25/22 29/30 days used; 90% > 4 hours Average usage 7 hours 50 mins Pressure 5-10cm h20 (8.2cm h20-95%) Airleaks 14.3L/min (95%) AHI 1.4    Allergies  Allergen Reactions   Divalproex Sodium Other (See Comments)    Hallucinations   Other reaction(s): Confusion  Hallucinations     Other reaction(s): Confusion    Hallucinations   Other Anaphylaxis, Other (See Comments), Nausea And Vomiting and Nausea Only   Pseudoeph-Hydrocodone-Gg Nausea Only   Sulfa Antibiotics Anaphylaxis, Nausea Only, Other (See Comments) and Swelling    Swelling of tongue.   Valproic Acid Other (See Comments)   Varenicline Other (See Comments)    Altered mental status-Per patient was put on allergy list by Dr Delbert Harness bu patient staes she is not allergic to this and is currently taking it.   Varenicline Tartrate Other (See Comments)    Other reaction(s): Confusion   Codeine Nausea And Vomiting and Other (See Comments)    Other reaction(s): vomiting   Hydrocodone Nausea And Vomiting and Other (See Comments)   Ambien [Zolpidem] Other (See Comments)    Causes sleep walking   Clavulanic Acid Diarrhea   Elemental Sulfur Other (See Comments)    Swelling of tongue   Hydrocod Poli-Chlorphe Poli Er Other (See Comments)    Other reaction(s): Skin itches    Macrobid [Nitrofurantoin] Nausea And Vomiting  Dizziness, nausea, vomiting   Paxil [Paroxetine Hcl] Other (See Comments)    Causes ringing in the ears.    Prednisone Other (See Comments)    Other reaction(s): Unknown    Amoxicillin Rash    Has patient had a PCN reaction causing immediate rash, facial/tongue/throat swelling, SOB or lightheadedness with hypotension: No Has patient had a PCN reaction causing severe rash involving mucus membranes or skin  necrosis: No Has patient had a PCN reaction that required hospitalization: No Has patient had a PCN reaction occurring within the last 10 years: Yes If all of the above answers are "NO", then may proceed with Cephalosporin use.    Farxiga [Dapagliflozin]     RECURRENT YEAST/UTI   Metformin And Related Diarrhea   Tape Rash    Immunization History  Administered Date(s) Administered   DTaP 02/23/1957, 03/23/1957, 04/27/1957, 10/11/1959   Fluad Quad(high Dose 65+) 03/08/2021, 03/07/2022   IPV 10/26/1957, 03/14/1961, 02/13/1962, 11/22/1965   Influenza Split 05/08/2004, 05/09/2005, 03/26/2016, 02/17/2019   Influenza,inj,Quad PF,6+ Mos 02/18/2017, 02/17/2019   Influenza,inj,quad, With Preservative 04/03/2015, 04/10/2018, 02/17/2019   Influenza-Unspecified 03/10/2020, 05/26/2023   Moderna SARS-COV2 Booster Vaccination 06/07/2020   Moderna Sars-Covid-2 Vaccination 08/19/2019, 09/23/2019   PNEUMOCOCCAL CONJUGATE-20 03/29/2021   Pneumococcal Conjugate-13 03/24/2014, 10/01/2017   Pneumococcal Polysaccharide-23 04/29/2007   Smallpox 10/11/1959   Tdap 11/20/2017   Zoster Recombinant(Shingrix) 03/29/2021   Zoster, Live 04/16/2013    Past Medical History:  Diagnosis Date   Aortic atherosclerosis (HCC) 10/31/2021   Arthritis    hands and knees   Bipolar 1 disorder (HCC)    Borderline diabetic    Chronic back pain    Chronic neck pain    Complication of anesthesia    high anxiety-does not want to be alone   COPD (chronic obstructive pulmonary disease) (HCC)    Current use of estrogen therapy 01/12/2014   Depression    Diabetes mellitus without complication (HCC)    Diplopia    Family history of adverse reaction to anesthesia    sister "gas in lungs"   GERD (gastroesophageal reflux disease)    Headache    Hepatic steatosis 10/31/2021   Hot flashes 12/15/2013   Hypertension    IBS (irritable bowel syndrome)    Incontinence    Kidney stone    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HCC)     Osteopenia 02/06/2022   Panic attacks    Peptic ulcer    Peripheral neuropathy    Rosacea    Shingles    Sleep apnea    uses a cpap-with oxygen    Tobacco History: Social History   Tobacco Use  Smoking Status Former   Current packs/day: 0.25   Average packs/day: 1 pack/day for 43.1 years (42.3 ttl pk-yrs)   Types: Cigarettes   Start date: 06/11/1975   Quit date: 06/10/2017  Smokeless Tobacco Never   Counseling given: Not Answered   Outpatient Medications Prior to Visit  Medication Sig Dispense Refill   albuterol (VENTOLIN HFA) 108 (90 Base) MCG/ACT inhaler Inhale 1-2 puffs into the lungs every 6 (six) hours as needed for wheezing or shortness of breath. 8.5 g 2   ARIPiprazole (ABILIFY) 10 MG tablet Take 10 mg by mouth every morning.     Artificial Saliva (BIOTENE DRY MOUTH MOISTURIZING) SOLN Use as directed 5 mLs in the mouth or throat in the morning and at bedtime. 44.3 mL 0   aspirin EC 81 MG tablet Take 81 mg by mouth daily. Swallow whole.  atorvastatin (LIPITOR) 80 MG tablet Take 1 tablet (80 mg total) by mouth daily. (Patient taking differently: Take 80 mg by mouth daily. Not taking) 90 tablet 3   cholecalciferol (VITAMIN D) 25 MCG tablet Take 1 tablet (1,000 Units total) by mouth daily.     Continuous Glucose Sensor (FREESTYLE LIBRE 2 SENSOR) MISC Apply to arm every 14 days to check blood sugar continuously. DX E11.65 2 each 11   diazepam (VALIUM) 5 MG tablet Take 5 mg by mouth 2 (two) times daily.     DULoxetine (CYMBALTA) 60 MG capsule TAKE (1) CAPSULE BY MOUTH EVERY DAY. (Patient taking differently: Not taking) 30 capsule 0   estrogens, conjugated, (PREMARIN) 0.3 MG tablet TAKE (1) TABLET BY MOUTH ONCE DAILY. 30 tablet 10   ezetimibe (ZETIA) 10 MG tablet TAKE (1) TABLET BY MOUTH ONCE DAILY. 30 tablet 5   fluorometholone (FML) 0.1 % ophthalmic suspension SMARTSIG:In Eye(s)     fluticasone (FLONASE) 50 MCG/ACT nasal spray USE 2 SPRAYS IN EACH NOSTRIL ONCE DAILY.  (Patient taking differently: Place 2 sprays into both nostrils daily.) 16 g 0   hyoscyamine (LEVSIN SL) 0.125 MG SL tablet Place 1 tablet (0.125 mg total) under the tongue every 8 (eight) hours as needed (abdominal pain). 90 tablet 2   insulin lispro (HUMALOG KWIKPEN) 100 UNIT/ML KwikPen Use 5-10 units 2 times daily with breakfast and dinner when blood sugar is >200 on steroids 15 mL 11   Insulin Pen Needle (PIP PEN NEEDLES 32G X ) 32G X 4 MM MISC Use to inject insulin 2 times daily as needed. DX E11.65 100 each 11   lamoTRIgine (LAMICTAL) 200 MG tablet Take 200 mg by mouth at bedtime. For mood disorder.     levocetirizine (XYZAL) 5 MG tablet Take 1 tablet (5 mg total) by mouth every evening. 30 tablet 6   lisinopril (ZESTRIL) 5 MG tablet TAKE (1) TABLET BY MOUTH ONCE DAILY. 90 tablet 0   meclizine (ANTIVERT) 25 MG tablet Take 2 tablets (50 mg total) by mouth daily as needed for dizziness. 60 tablet 2   montelukast (SINGULAIR) 10 MG tablet TAKE (1) TABLET BY MOUTH AT BEDTIME. 90 tablet 1   MYRBETRIQ 50 MG TB24 tablet Take 1 tablet (50 mg total) by mouth daily. **NEEDS TO BE SEEN BEFORE NEXT REFILL** 30 tablet 0   omeprazole (PRILOSEC) 40 MG capsule Take 1 capsule (40 mg total) by mouth daily. 90 capsule 3   OneTouch Delica Lancets 30G MISC Use to test blood sugar twice daily. DX: E11.9 100 each 3   oxyCODONE (ROXICODONE) 15 MG immediate release tablet Take 1 tablet (15 mg total) by mouth every 6 (six) hours as needed for pain. 10 tablet 0   pregabalin (LYRICA) 150 MG capsule TAKE 1 CAPSULE BY MOUTH THREE TIMES A DAY. 90 capsule 0   promethazine (PHENERGAN) 12.5 MG tablet Take 1 tablet (12.5 mg total) by mouth every 8 (eight) hours as needed for nausea or vomiting. 20 tablet 0   tirzepatide (MOUNJARO) 10 MG/0.5ML Pen Inject 10 mg into the skin once a week. 2 mL 1   umeclidinium-vilanterol (ANORO ELLIPTA) 62.5-25 MCG/ACT AEPB Inhale 1 puff into the lungs daily. 60 each 5   valACYclovir (VALTREX)  1000 MG tablet Take 1 tablet (1,000 mg total) by mouth daily. 30 tablet 12   vitamin B-12 1000 MCG tablet Take 1 tablet (1,000 mcg total) by mouth daily.     VRAYLAR 3 MG capsule Take 3 mg by mouth at  bedtime.     No facility-administered medications prior to visit.   Review of Systems  Review of Systems  HENT:  Positive for congestion, sinus pressure and sinus pain.   Respiratory: Negative.    Cardiovascular: Negative.  Negative for chest pain and palpitations.  Neurological:  Positive for headaches.     Physical Exam  BP 136/86   Pulse (!) 119   Ht 5\' 9"  (1.753 m)   Wt 167 lb (75.8 kg)   SpO2 96% Comment: room air  BMI 24.66 kg/m  Physical Exam Constitutional:      Appearance: Normal appearance.  HENT:     Right Ear: Tympanic membrane normal. There is no impacted cerumen.     Left Ear: Tympanic membrane normal. There is no impacted cerumen.     Nose:     Right Sinus: Maxillary sinus tenderness and frontal sinus tenderness present.     Left Sinus: Maxillary sinus tenderness and frontal sinus tenderness present.     Mouth/Throat:     Mouth: Mucous membranes are moist.     Pharynx: Oropharynx is clear.  Cardiovascular:     Rate and Rhythm: Tachycardia present.  Pulmonary:     Effort: Pulmonary effort is normal.     Breath sounds: Normal breath sounds. No wheezing, rhonchi or rales.  Skin:    General: Skin is warm and dry.  Neurological:     General: No focal deficit present.     Mental Status: She is alert and oriented to person, place, and time. Mental status is at baseline.  Psychiatric:        Mood and Affect: Mood normal.        Behavior: Behavior normal.        Thought Content: Thought content normal.        Judgment: Judgment normal.      Lab Results:  CBC    Component Value Date/Time   WBC 12.2 (H) 04/15/2023 1114   RBC 4.85 04/15/2023 1114   HGB 16.1 (H) 04/15/2023 1114   HGB 15.1 05/22/2022 1531   HCT 47.9 (H) 04/15/2023 1114   HCT 44.1  05/22/2022 1531   PLT 204 04/15/2023 1114   PLT 212 05/22/2022 1531   MCV 98.8 04/15/2023 1114   MCV 94 05/22/2022 1531   MCH 33.2 04/15/2023 1114   MCHC 33.6 04/15/2023 1114   RDW 13.0 04/15/2023 1114   RDW 12.7 05/22/2022 1531   LYMPHSABS 2.4 05/22/2022 1531   MONOABS 0.5 11/21/2021 0911   EOSABS 0.2 05/22/2022 1531   BASOSABS 0.1 05/22/2022 1531    BMET    Component Value Date/Time   NA 134 (L) 04/15/2023 1114   NA 141 11/22/2022 1452   K 4.0 04/15/2023 1114   CL 101 04/15/2023 1114   CO2 24 04/15/2023 1114   GLUCOSE 202 (H) 04/15/2023 1114   BUN 11 04/15/2023 1114   BUN 11 11/22/2022 1452   CREATININE 0.66 04/15/2023 1114   CALCIUM 8.8 (L) 04/15/2023 1114   GFRNONAA >60 04/15/2023 1114   GFRAA 102 06/08/2020 0847    BNP No results found for: "BNP"  ProBNP No results found for: "PROBNP"  Imaging: No results found.   Assessment & Plan:   1. Chronic allergic rhinitis - montelukast (SINGULAIR) 10 MG tablet; TAKE (1) TABLET BY MOUTH AT BEDTIME.  Dispense: 90 tablet; Refill: 3  2. COPD with asthma (HCC) (Primary)  3. Former smoker  4. Acute frontal sinusitis, recurrence not specified  5. Nocturnal hypoxemia  6. OSA (obstructive sleep apnea)      COPD/Asthma Patient reports daily use of albuterol and has not been using Anoro. Patient reports that breathing could be better. -Resume Anoro daily for long-term control of symptoms. -Continue albuterol as needed for breakthrough symptoms. -Refill Singulair for allergies and continue to take at bedtime.  Acute sinusitis Patient reports severe frontal headaches, congestion, and yellow mucus. No relief from Flonase or Tylenol. -Dx doxycycline 100mg  BID x 7 days and diflucan to prevent vaginal yeast infection.  -Re-start Singulair and azelastine for sinusitis.  Sleep Apnea Patient is 90% compliant with CPAP use over the last 30 days >4 hours and 2L oxygen use at night. No reported issues.  Current pressure  5-10 cm H2O with residual AHI 1.4/h. -Continue current management.  Tachycardia Heart rate was slightly elevated during the visit. Patient reports daily caffeine use and current high stress levels. -Advise patient to monitor symptoms and seek emergency care if experiencing chest pain or racing heart rate. -Notify primary care provider about elevated heart rate for further evaluation.  Tobacco Use Patient quit smoking approximately 1.5 years ago and has maintained abstinence. -Encourage continued abstinence from smoking.  Lung Cancer Screening Unclear when the next CT scan is scheduled. -Ensure that the next CT scan is scheduled and patient is notified.  Follow-up in 6 months to assess response to Anoro and overall management of COPD/asthma.     Glenford Bayley, NP 06/30/2023

## 2023-07-03 DIAGNOSIS — G4733 Obstructive sleep apnea (adult) (pediatric): Secondary | ICD-10-CM | POA: Diagnosis not present

## 2023-07-07 ENCOUNTER — Other Ambulatory Visit: Payer: Self-pay | Admitting: Family Medicine

## 2023-07-07 DIAGNOSIS — E782 Mixed hyperlipidemia: Secondary | ICD-10-CM

## 2023-07-08 ENCOUNTER — Other Ambulatory Visit: Payer: Self-pay | Admitting: Cardiovascular Disease

## 2023-07-08 DIAGNOSIS — E782 Mixed hyperlipidemia: Secondary | ICD-10-CM

## 2023-07-09 DIAGNOSIS — M7061 Trochanteric bursitis, right hip: Secondary | ICD-10-CM | POA: Diagnosis not present

## 2023-07-09 DIAGNOSIS — Z5181 Encounter for therapeutic drug level monitoring: Secondary | ICD-10-CM | POA: Diagnosis not present

## 2023-07-09 DIAGNOSIS — E1165 Type 2 diabetes mellitus with hyperglycemia: Secondary | ICD-10-CM | POA: Diagnosis not present

## 2023-07-09 DIAGNOSIS — Z79899 Other long term (current) drug therapy: Secondary | ICD-10-CM | POA: Diagnosis not present

## 2023-07-09 DIAGNOSIS — M7062 Trochanteric bursitis, left hip: Secondary | ICD-10-CM | POA: Diagnosis not present

## 2023-07-21 DIAGNOSIS — E1142 Type 2 diabetes mellitus with diabetic polyneuropathy: Secondary | ICD-10-CM | POA: Diagnosis not present

## 2023-07-27 ENCOUNTER — Other Ambulatory Visit: Payer: Self-pay | Admitting: Family Medicine

## 2023-07-27 DIAGNOSIS — N952 Postmenopausal atrophic vaginitis: Secondary | ICD-10-CM

## 2023-07-27 DIAGNOSIS — Z79899 Other long term (current) drug therapy: Secondary | ICD-10-CM

## 2023-07-28 ENCOUNTER — Ambulatory Visit: Payer: Self-pay | Admitting: Family Medicine

## 2023-07-28 ENCOUNTER — Ambulatory Visit: Payer: 59 | Admitting: Nurse Practitioner

## 2023-07-28 NOTE — Telephone Encounter (Signed)
Chief Complaint: fall Symptoms: elbow injury, bruising, elbow pain Frequency: 2 weeks ago Pertinent Negatives: Patient denies fever, open wound Disposition: [] ED /[] Urgent Care (no appt availability in office) / [x] Appointment(In office/virtual)/ []  [place] [facility]/ [] Home Care/ [] Refused Recommended Disposition /[] [facility]/ []  Follow-up with PCP Additional Notes: Patient reports she had a fall on her porch due to dizziness approximately 2 weeks ago. At the time, she had injured both elbows and had cuts on both. EMS were called to the scene and patient states she refused to go to the ED at that time.  Patient states the open cuts on her elbows have both healed but that her elbows are still red and painful to touch. Patient reports pain is a 7/10. Patient requesting appt, per protocol, appt scheduled today 2/17. Patient states she would also like to discuss medication options for her vertigo which is what she believes is causing her episodes of dizziness. Patient advised to call back with worsening symptoms. Patient verbalized understanding.     Copied from CRM [id number]. Topic: Appointments - Appointment Scheduling >> Jul 28, 2023  8:38 AM Gery Pray wrote: Patient/patient representative is calling to schedule an appointment. Refer to attachments for appointment information. Patient is complaining of pain in both of elbows with the right elbow being worse. Patient stated that she fell and had to call the paramedics to assist her in getting up. She stated that after she was seen and treated by the paramedics. She was not seen nor treated by her PCP nor Hospital for a follow up nor additional care. Patient is now complaining of worsening symptoms in both her elbows and states that they are not healing. Patient states that pain is a 7. Reason for Disposition  Dizziness is a chronic symptom (recurrent or ongoing AND present > 4 weeks)  Answer Assessment - Initial Assessment  Questions 1. MECHANISM: "How did the fall happen?"     I bent down to pick up package and had episode of dizziness which caused me to fall 2. DOMESTIC VIOLENCE AND ELDER ABUSE SCREENING: "Did you fall because someone pushed you or tried to hurt you?" If Yes, ask: "Are you safe now?"     no 3. ONSET: "When did the fall happen?" (e.g., minutes, hours, or days ago)     2 week ago 4. LOCATION: "What part of the body hit the ground?" (e.g., back, buttocks, head, hips, knees, hands, head, stomach)     elbows 5. INJURY: "Did you hurt (injure) yourself when you fell?" If Yes, ask: "What did you injure? Tell me more about this?" (e.g., body area; type of injury; pain severity)"    Bilateral elbows, right is worse, redness 6. PAIN: "Is there any pain?" If Yes, ask: "How bad is the pain?" (e.g., Scale 1-10; or mild,  moderate, severe)   - NONE (0): No pain   - MILD (1-3): Doesn't interfere with normal activities    - MODERATE (4-7): Interferes with normal activities or awakens from sleep    - SEVERE (8-10): Excruciating pain, unable to do any normal activities      7/10 7. SIZE: For cuts, bruises, or swelling, ask: "How large is it?" (e.g., inches or centimeters)      Had cuts at the time but they have healed 8. PREGNANCY: "Is there any chance you are pregnant?" "When was your last menstrual period?"     N/a 9. OTHER SYMPTOMS: "Do you have any other symptoms?" (e.g., dizziness, fever, weakness;  new onset or worsening).      Dizziness 10. CAUSE: "What do you think caused the fall (or falling)?" (e.g., tripped, dizzy spell)       Dizzy spell  Protocols used: Falls and [facility]

## 2023-08-02 ENCOUNTER — Ambulatory Visit (HOSPITAL_COMMUNITY): Payer: 59

## 2023-08-03 DIAGNOSIS — G4733 Obstructive sleep apnea (adult) (pediatric): Secondary | ICD-10-CM | POA: Diagnosis not present

## 2023-08-05 ENCOUNTER — Telehealth: Payer: Self-pay | Admitting: Pharmacist

## 2023-08-05 ENCOUNTER — Other Ambulatory Visit: Payer: Self-pay | Admitting: Family Medicine

## 2023-08-05 DIAGNOSIS — E11649 Type 2 diabetes mellitus with hypoglycemia without coma: Secondary | ICD-10-CM

## 2023-08-05 DIAGNOSIS — N958 Other specified menopausal and perimenopausal disorders: Secondary | ICD-10-CM

## 2023-08-05 MED ORDER — TIRZEPATIDE 12.5 MG/0.5ML ~~LOC~~ SOAJ: 12.5000 mg | SUBCUTANEOUS | 3 refills | Status: DC

## 2023-08-05 MED ORDER — FREESTYLE LIBRE 3 READER DEVI
1 refills | Status: AC
Start: 2023-08-05 — End: ?

## 2023-08-05 MED ORDER — FREESTYLE LIBRE 3 PLUS SENSOR MISC: 5 refills | Status: DC

## 2023-08-05 NOTE — Telephone Encounter (Signed)
    08/05/2023 Name: DEBANHI BLAKER     MRN: [id number]       DOB: February 12, 1956      Chief Complaint  Patient presents with   Diabetes      KORALYN PRESTAGE is a 68 y.o. year old female who was referred for medication management by their primary care provider, Rakes, Doralee Albino, FNP. They presented for a face to face visit today.   They were referred to the pharmacist by their PCP for assistance in managing diabetes and hyperlipidemia  Diabetes: - Currently controlled - Reviewed long term cardiovascular and renal outcomes of uncontrolled blood sugar - Reviewed goal A1c, goal fasting, and goal 2 hour post prandial glucose - Reviewed dietary modifications including FOLLOWING A HEART HEALTHY DIET/HEALTHY PLATE METHOD - Reviewed lifestyle modifications including: - Recommend to : Increase Mounjaro to 12.5mg  weekly; patient tolerating well encouraged more scanning of libre 2 CGM Will need to transition patient to [place] 3 PLUS due to product discontinuation  - Patient denies personal or family history of multiple endocrine neoplasia type 2, medullary thyroid cancer; personal history of pancreatitis or gallbladder disease. - Recommend to check glucose using libre 2   Follow Up Plan: 3 months     Kieth Brightly, PharmD, BCACP, CPP Clinical Pharmacist, [facility]

## 2023-08-12 ENCOUNTER — Ambulatory Visit: Payer: 59 | Admitting: Family Medicine

## 2023-08-12 ENCOUNTER — Encounter: Payer: Self-pay | Admitting: Family Medicine

## 2023-08-12 VITALS — BP 120/72 | HR 100 | Temp 97.4°F | Ht 69.0 in | Wt 162.8 lb

## 2023-08-12 DIAGNOSIS — Z794 Long term (current) use of insulin: Secondary | ICD-10-CM

## 2023-08-12 DIAGNOSIS — L282 Other prurigo: Secondary | ICD-10-CM | POA: Diagnosis not present

## 2023-08-12 DIAGNOSIS — J301 Allergic rhinitis due to pollen: Secondary | ICD-10-CM

## 2023-08-12 DIAGNOSIS — E1169 Type 2 diabetes mellitus with other specified complication: Secondary | ICD-10-CM

## 2023-08-12 DIAGNOSIS — E1165 Type 2 diabetes mellitus with hyperglycemia: Secondary | ICD-10-CM | POA: Diagnosis not present

## 2023-08-12 DIAGNOSIS — J449 Chronic obstructive pulmonary disease, unspecified: Secondary | ICD-10-CM

## 2023-08-12 DIAGNOSIS — I152 Hypertension secondary to endocrine disorders: Secondary | ICD-10-CM | POA: Diagnosis not present

## 2023-08-12 DIAGNOSIS — E119 Type 2 diabetes mellitus without complications: Secondary | ICD-10-CM | POA: Insufficient documentation

## 2023-08-12 DIAGNOSIS — E1159 Type 2 diabetes mellitus with other circulatory complications: Secondary | ICD-10-CM | POA: Diagnosis not present

## 2023-08-12 DIAGNOSIS — M795 Residual foreign body in soft tissue: Secondary | ICD-10-CM | POA: Diagnosis not present

## 2023-08-12 DIAGNOSIS — L03114 Cellulitis of left upper limb: Secondary | ICD-10-CM | POA: Diagnosis not present

## 2023-08-12 DIAGNOSIS — E785 Hyperlipidemia, unspecified: Secondary | ICD-10-CM

## 2023-08-12 DIAGNOSIS — E1142 Type 2 diabetes mellitus with diabetic polyneuropathy: Secondary | ICD-10-CM

## 2023-08-12 DIAGNOSIS — R42 Dizziness and giddiness: Secondary | ICD-10-CM | POA: Diagnosis not present

## 2023-08-12 DIAGNOSIS — N958 Other specified menopausal and perimenopausal disorders: Secondary | ICD-10-CM

## 2023-08-12 DIAGNOSIS — Z0000 Encounter for general adult medical examination without abnormal findings: Secondary | ICD-10-CM | POA: Diagnosis not present

## 2023-08-12 DIAGNOSIS — E1342 Other specified diabetes mellitus with diabetic polyneuropathy: Secondary | ICD-10-CM | POA: Insufficient documentation

## 2023-08-12 LAB — BAYER DCA HB A1C WAIVED: HB A1C (BAYER DCA - WAIVED): 5.6 % (ref 4.8–5.6)

## 2023-08-12 MED ORDER — INSULIN LISPRO (1 UNIT DIAL) 100 UNIT/ML (KWIKPEN): PEN_INJECTOR | SUBCUTANEOUS | 11 refills | Status: DC

## 2023-08-12 MED ORDER — LEVOCETIRIZINE DIHYDROCHLORIDE 5 MG PO TABS
5.0000 mg | ORAL_TABLET | Freq: Every evening | ORAL | 6 refills | Status: DC
Start: 2023-08-12 — End: 2023-08-12

## 2023-08-12 MED ORDER — PROMETHAZINE HCL 12.5 MG PO TABS: 12.5000 mg | ORAL_TABLET | Freq: Three times a day (TID) | ORAL | 3 refills | Status: AC | PRN

## 2023-08-12 MED ORDER — TRIAMCINOLONE ACETONIDE 0.1 % EX CREA
1.0000 | TOPICAL_CREAM | Freq: Two times a day (BID) | CUTANEOUS | 0 refills | Status: AC
Start: 2023-08-12 — End: ?

## 2023-08-12 MED ORDER — MECLIZINE HCL 25 MG PO TABS: 50.0000 mg | ORAL_TABLET | Freq: Every day | ORAL | 2 refills | Status: DC | PRN

## 2023-08-12 MED ORDER — LISINOPRIL 5 MG PO TABS: 5.0000 mg | ORAL_TABLET | Freq: Every day | ORAL | 1 refills | Status: DC

## 2023-08-12 MED ORDER — MYRBETRIQ 50 MG PO TB24: 50.0000 mg | ORAL_TABLET | Freq: Every day | ORAL | 1 refills | Status: DC

## 2023-08-12 MED ORDER — DOXYCYCLINE HYCLATE 100 MG PO TABS: 100.0000 mg | ORAL_TABLET | Freq: Two times a day (BID) | ORAL | 0 refills | Status: AC

## 2023-08-12 MED ORDER — METHYLPREDNISOLONE ACETATE 40 MG/ML IJ SUSP
40.0000 mg | Freq: Once | INTRAMUSCULAR | Status: AC
  Administered 2023-08-12: 60 mg via INTRAMUSCULAR

## 2023-08-12 MED ORDER — MYRBETRIQ 50 MG PO TB24: 50.0000 mg | ORAL_TABLET | Freq: Every day | ORAL | 1 refills | Status: AC

## 2023-08-12 MED ORDER — CETIRIZINE HCL 10 MG PO TABS: 10.0000 mg | ORAL_TABLET | Freq: Every day | ORAL | 1 refills | Status: DC

## 2023-08-12 NOTE — Progress Notes (Addendum)
 Subjective:  [name], female    DOB: [birth year], 68 y.o.   MRN: [id number]  Patient Care Team: Sonny Masters, FNP as PCP - General (Family Medicine) Sheran Luz, MD as Consulting Physician (Physical Medicine and Rehabilitation) Archer Asa, MD as Consulting Physician (Psychiatry) Beryle Beams, MD as Consulting Physician (Neurology) Danella Maiers, [facility] (Pharmacist) Glyn Ade, PA-C as Physician Assistant (Dermatology) Daisy Lazar, DO (Optometry) Glenford Bayley, NP as Nurse Practitioner (Pulmonary Disease)   Chief Complaint:  Rash (On face- patient states she woke up with a rash on her face this morning ), Dizziness, and Foreign Body in Skin   HPI: Cynthia Deleon is a 68 y.o. female presenting on 08/12/2023 for Rash (On face- patient states she woke up with a rash on her face this morning ), Dizziness, and Foreign Body in Skin   Discussed the use of AI scribe software for clinical note transcription with the patient, who gave verbal consent to proceed.  History of Present Illness   Cynthia Deleon is a 68 year old female who presents with dizziness and a recent fall into a cactus.  She experiences recurrent episodes of dizziness and vertigo, sometimes accompanied by vomiting. She is concerned about managing these symptoms during an upcoming cruise in April. She uses meclizine for vertigo and requests a prescription for it, along with a medication for nausea.  Approximately two weeks ago, she experienced a fall after becoming dizzy while picking up a [employer] box, resulting in injuries to both elbows and cactus spines embedded in her hand. Her tetanus vaccination is up to date.  She has noticed a rash on her face, which she observed this morning. 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 The rash is concerning as it is near her eye.  Regarding her diabetes, her A1c  is 5.6. She reports increased urination and requests an increase in her 'pee pill' due to incontinence. She is currently on insulin and pravastatin for cholesterol management. She stopped a previous cholesterol medication due to a rash but is tolerating pravastatin well. She has a CGM and states blood sugars have been doing well. This was reviewed today and looks good. No significant high or low readings.   She has COPD and mentions stopping a 'big white pill' due to a rash. 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 She also uses Xyzal for allergies but reports persistent nasal symptoms despite long-term use.  She has received her flu shot, hepatitis vaccination, and pneumonia vaccines. She inquires about her shingles vaccination status, which appears to be up to date. She also requests new diabetic shoes.          Relevant past medical, surgical, family, and social history reviewed and updated as indicated.  Allergies and medications reviewed and updated. Data reviewed: Chart in Epic.   Past Medical History:  Diagnosis Date   Aortic atherosclerosis (HCC) 10/31/2021   Arthritis    hands and knees   Bipolar 1 disorder (HCC)    Borderline diabetic    Chronic back pain    Chronic neck pain    Complication of anesthesia    high anxiety-does not want to be alone   COPD (chronic obstructive pulmonary disease) (HCC)    Current use of estrogen therapy 01/12/2014   Depression  Diabetes mellitus without complication (HCC)    Diplopia    Family history of adverse reaction to anesthesia    sister "gas in lungs"   GERD (gastroesophageal reflux disease)    Headache    Hepatic steatosis 10/31/2021   Hot flashes 12/15/2013   Hypertension    IBS (irritable bowel syndrome)    Incontinence    Kidney stone    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HCC)    Osteopenia 02/06/2022   Panic attacks    Peptic ulcer    Peripheral  neuropathy    Rosacea    Shingles    Sleep apnea    uses a cpap-with oxygen    Past Surgical History:  Procedure Laterality Date   BIOPSY  04/27/2020   Procedure: BIOPSY;  Surgeon: Bernette Redbird, MD;  Location: [facility];  Service: Endoscopy;;   BUNIONECTOMY WITH HAMMERTOE RECONSTRUCTION Right 12/10/2012   Procedure: RIGHT FIRST METATARSAL CHEVRON BUNION CORRECTION,  2 AND 3 HAMMERTOE CORRECTION , RIGHT 3 AND 4 TOE NAIL EXCISION ;  Surgeon: Toni Arthurs, MD;  Location: [facility];  Service: Orthopedics;  Laterality: Right;   COLONOSCOPY WITH PROPOFOL N/A 04/27/2020   Procedure: COLONOSCOPY WITH PROPOFOL;  Surgeon: Bernette Redbird, MD;  Location: [facility];  Service: Endoscopy;  Laterality: N/A;   COLONOSCOPY WITH PROPOFOL N/A 06/17/2023   Procedure: COLONOSCOPY WITH PROPOFOL;  Surgeon: Dolores Frame, MD;  Location: [facility];  Service: Gastroenterology;  Laterality: N/A;  2:30PM;ASA 3   FOOT ARTHRODESIS  2000   both feet   LIPOSUCTION  03/2018   POLYPECTOMY  06/17/2023   Procedure: POLYPECTOMY;  Surgeon: Dolores Frame, MD;  Location: [facility];  Service: Gastroenterology;;   RECTAL SURGERY     TONSILLECTOMY     TOTAL ABDOMINAL HYSTERECTOMY      Social History   Socioeconomic History   Marital status: Divorced    Spouse name: Not on file   Number of children: 5   Years of education: 9th   Highest education level: Not on file  Occupational History   Occupation: Disabled  Tobacco Use   Smoking status: Former    Current packs/day: 0.25    Average packs/day: 1 pack/day for 43.2 years (42.3 ttl pk-yrs)    Types: Cigarettes    Start date: 06/11/1975    Quit date: 06/10/2017   Smokeless tobacco: Never  Vaping Use   Vaping status: Never Used  Substance and Sexual Activity   Alcohol use: No    Alcohol/week: 0.0 standard drinks of alcohol   Drug use: No   Sexual activity: Not Currently    Birth control/protection: Surgical     Comment: hyst  Other Topics Concern   Not on file  Social History Narrative   Lives at home with home with her son (has five children).   Right-handed.   1 cup caffeine per day.   Social Drivers of Corporate investment banker Strain: Low Risk  (08/21/2022)   Overall Financial Resource Strain (CARDIA)    Difficulty of Paying Living Expenses: Not hard at all  Food Insecurity: No Food Insecurity (08/21/2022)   Hunger Vital Sign    Worried About Running Out of Food in the Last Year: Never true    Ran Out of Food in the Last Year: Never true  Transportation Needs: No Transportation Needs (08/21/2022)   PRAPARE - Administrator, Civil Service (Medical): No    Lack of Transportation (Non-Medical): No  Physical Activity:  Inactive (08/21/2022)   Exercise Vital Sign    Days of Exercise per Week: 0 days    Minutes of Exercise per Session: 0 min  Stress: No Stress Concern Present (08/21/2022)   [employer] of Occupational Health - Occupational Stress Questionnaire    Feeling of Stress : Not at all  Social Connections: Moderately Integrated (08/21/2022)   Social Connection and Isolation Panel [NHANES]    Frequency of Communication with Friends and Family: More than three times a week    Frequency of Social Gatherings with Friends and Family: More than three times a week    Attends Religious Services: More than 4 times per year    Active Member of [employer] or Organizations: Yes    Attends Engineer, structural: More than 4 times per year    Marital Status: Divorced  Intimate Partner Violence: Not At Risk (08/21/2022)   Humiliation, Afraid, Rape, and Kick questionnaire    Fear of Current or Ex-Partner: No    Emotionally Abused: No    Physically Abused: No    Sexually Abused: No    Outpatient Encounter Medications as of 08/12/2023  Medication Sig   albuterol (VENTOLIN HFA) 108 (90 Base) MCG/ACT inhaler Inhale 1-2 puffs into the lungs every 6 (six) hours as needed for  wheezing or shortness of breath.   ARIPiprazole (ABILIFY) 10 MG tablet Take 10 mg by mouth every morning.   Artificial Saliva (BIOTENE DRY MOUTH MOISTURIZING) SOLN Use as directed 5 mLs in the mouth or throat in the morning and at bedtime.   aspirin EC 81 MG tablet Take 81 mg by mouth daily. Swallow whole.   atorvastatin (LIPITOR) 80 MG tablet Take 1 tablet (80 mg total) by mouth daily. (Patient taking differently: Take 80 mg by mouth daily. Not taking)   cetirizine (ZYRTEC) 10 MG tablet Take 1 tablet (10 mg total) by mouth daily.   cholecalciferol (VITAMIN D) 25 MCG tablet Take 1 tablet (1,000 Units total) by mouth daily.   Continuous Glucose Receiver (FREESTYLE LIBRE 3 READER) DEVI with compatible Freestyle Libre 3 sensor to monitor glucose continuously. DX: E11.65   Continuous Glucose Sensor (FREESTYLE LIBRE 3 PLUS SENSOR) MISC Change sensor every 15 days. DX: E11.65   diazepam (VALIUM) 5 MG tablet Take 5 mg by mouth 2 (two) times daily.   doxycycline (VIBRA-TABS) 100 MG tablet Take 1 tablet (100 mg total) by mouth 2 (two) times daily for 10 days. 1 po bid   DULoxetine (CYMBALTA) 60 MG capsule TAKE (1) CAPSULE BY MOUTH EVERY DAY. (Patient taking differently: Not taking)   estrogens, conjugated, (PREMARIN) 0.3 MG tablet TAKE (1) TABLET BY MOUTH ONCE DAILY.   ezetimibe (ZETIA) 10 MG tablet TAKE (1) TABLET BY MOUTH ONCE DAILY.   fluorometholone (FML) 0.1 % ophthalmic suspension SMARTSIG:In Eye(s)   fluticasone (FLONASE) 50 MCG/ACT nasal spray USE 2 SPRAYS IN EACH NOSTRIL ONCE DAILY. (Patient taking differently: Place 2 sprays into both nostrils daily.)   hyoscyamine (LEVSIN SL) 0.125 MG SL tablet Place 1 tablet (0.125 mg total) under the tongue every 8 (eight) hours as needed (abdominal pain).   Insulin Pen Needle (PIP PEN NEEDLES 32G X ) 32G X 4 MM MISC Use to inject insulin 2 times daily as needed. DX E11.65   lamoTRIgine (LAMICTAL) 200 MG tablet Take 200 mg by mouth at bedtime. For mood  disorder.   lansoprazole (PREVACID) 30 MG capsule TAKE (1) CAPSULE BY MOUTH ONCE DAILY.   montelukast (SINGULAIR) 10 MG tablet TAKE (  1) TABLET BY MOUTH AT BEDTIME.   omeprazole (PRILOSEC) 40 MG capsule Take 1 capsule (40 mg total) by mouth daily.   OneTouch Delica Lancets 30G MISC Use to test blood sugar twice daily. DX: E11.9   oxyCODONE (ROXICODONE) 15 MG immediate release tablet Take 1 tablet (15 mg total) by mouth every 6 (six) hours as needed for pain.   pregabalin (LYRICA) 100 MG capsule Take 2 capsules by mouth 3 (three) times daily.   tirzepatide (MOUNJARO) 12.5 MG/0.5ML Pen Inject 12.5 mg into the skin once a week. DX: E11.65   triamcinolone cream (KENALOG) 0.1 % Apply 1 Application topically 2 (two) times daily.   umeclidinium-vilanterol (ANORO ELLIPTA) 62.5-25 MCG/ACT AEPB Inhale 1 puff into the lungs daily.   valACYclovir (VALTREX) 1000 MG tablet Take 1 tablet (1,000 mg total) by mouth daily.   vitamin B-12 1000 MCG tablet Take 1 tablet (1,000 mcg total) by mouth daily.   VRAYLAR 3 MG capsule Take 3 mg by mouth at bedtime.   [DISCONTINUED] insulin lispro (HUMALOG KWIKPEN) 100 UNIT/ML KwikPen Use 5-10 units 2 times daily with breakfast and dinner when blood sugar is >200 on steroids   [DISCONTINUED] levocetirizine (XYZAL) 5 MG tablet Take 1 tablet (5 mg total) by mouth every evening.   [DISCONTINUED] lisinopril (ZESTRIL) 5 MG tablet TAKE (1) TABLET BY MOUTH ONCE DAILY.   [DISCONTINUED] meclizine (ANTIVERT) 25 MG tablet Take 2 tablets (50 mg total) by mouth daily as needed for dizziness.   [DISCONTINUED] MYRBETRIQ 50 MG TB24 tablet Take 1 tablet (50 mg total) by mouth daily.   [DISCONTINUED] pregabalin (LYRICA) 150 MG capsule TAKE 1 CAPSULE BY MOUTH THREE TIMES A DAY.   [DISCONTINUED] promethazine (PHENERGAN) 12.5 MG tablet Take 1 tablet (12.5 mg total) by mouth every 8 (eight) hours as needed for nausea or vomiting.   insulin lispro (HUMALOG KWIKPEN) 100 UNIT/ML KwikPen Use 5-10 units  2 times daily with breakfast and dinner when blood sugar is >200 on steroids   lisinopril (ZESTRIL) 5 MG tablet Take 1 tablet (5 mg total) by mouth daily.   meclizine (ANTIVERT) 25 MG tablet Take 2 tablets (50 mg total) by mouth daily as needed for dizziness.   MYRBETRIQ 50 MG TB24 tablet Take 1 tablet (50 mg total) by mouth daily.   promethazine (PHENERGAN) 12.5 MG tablet Take 1 tablet (12.5 mg total) by mouth every 8 (eight) hours as needed for nausea or vomiting.   [DISCONTINUED] doxycycline (VIBRA-TABS) 100 MG tablet Take 1 tablet (100 mg total) by mouth 2 (two) times daily.   [DISCONTINUED] fluconazole (DIFLUCAN) 150 MG tablet May repeat in 72 hours if needed   [DISCONTINUED] levocetirizine (XYZAL) 5 MG tablet Take 1 tablet (5 mg total) by mouth every evening.   [DISCONTINUED] MYRBETRIQ 50 MG TB24 tablet Take 1 tablet (50 mg total) by mouth daily.   [EXPIRED] methylPREDNISolone acetate (DEPO-MEDROL) injection 40 mg    No facility-administered encounter medications on file as of 08/12/2023.    Allergies  Allergen Reactions   Divalproex Sodium Other (See Comments)    Hallucinations   Other reaction(s): Confusion  Hallucinations     Other reaction(s): Confusion    Hallucinations   Other Anaphylaxis, Other (See Comments), Nausea And Vomiting and Nausea Only   Pseudoeph-Hydrocodone-Gg Nausea Only   Sulfa Antibiotics Anaphylaxis, Nausea Only, Other (See Comments) and Swelling    Swelling of tongue.   Valproic Acid Other (See Comments)   Varenicline Other (See Comments)    Altered mental status-Per patient  was put on allergy list by Dr Delbert Harness bu patient staes she is not allergic to this and is currently taking it.   Varenicline Tartrate Other (See Comments)    Other reaction(s): Confusion   Codeine Nausea And Vomiting and Other (See Comments)    Other reaction(s): vomiting   Hydrocodone Nausea And Vomiting and Other (See Comments)   Acetaminophen Nausea Only   Ambien [Zolpidem]  Other (See Comments)    Causes sleep walking   Clavulanic Acid Diarrhea   Elemental Sulfur Other (See Comments)    Swelling of tongue   Hydrocod Poli-Chlorphe Poli Er Other (See Comments)    Other reaction(s): Skin itches    Macrobid [Nitrofurantoin] Nausea And Vomiting    Dizziness, nausea, vomiting   Paxil [Paroxetine Hcl] Other (See Comments)    Causes ringing in the ears.    Prednisone Other (See Comments)    Other reaction(s): Unknown    Amoxicillin Rash    Has patient had a PCN reaction causing immediate rash, facial/tongue/throat swelling, SOB or lightheadedness with hypotension: No Has patient had a PCN reaction causing severe rash involving mucus membranes or skin necrosis: No Has patient had a PCN reaction that required hospitalization: No Has patient had a PCN reaction occurring within the last 10 years: Yes If all of the above answers are "NO", then may proceed with Cephalosporin use.    Farxiga [Dapagliflozin]     RECURRENT YEAST/UTI   Metformin And Related Diarrhea   Tape Rash    Pertinent ROS per HPI, otherwise unremarkable      Objective:  BP 120/72   Pulse 100   Temp (!) 97.4 F (36.3 C)   Ht 5\' 9"  (1.753 m)   Wt 162 lb 12.8 oz (73.8 kg)   SpO2 92%   BMI 24.04 kg/m    Wt Readings from Last 3 Encounters:  08/12/23 162 lb 12.8 oz (73.8 kg)  06/30/23 167 lb (75.8 kg)  06/17/23 165 lb (74.8 kg)    Physical Exam Vitals and nursing note reviewed.  Constitutional:      General: She is not in acute distress.    Appearance: Normal appearance. She is normal weight. She is not ill-appearing, toxic-appearing or diaphoretic.  HENT:     Head: Normocephalic and atraumatic.     Nose: Nose normal.     Mouth/Throat:     Mouth: Mucous membranes are moist.     Pharynx: Oropharynx is clear.  Eyes:     Conjunctiva/sclera: Conjunctivae normal.     Pupils: Pupils are equal, round, and reactive to light.  Cardiovascular:     Rate and Rhythm: Normal rate and  regular rhythm.     Pulses:          Dorsalis pedis pulses are 2+ on the right side and 2+ on the left side.       Posterior tibial pulses are 2+ on the right side and 2+ on the left side.     Heart sounds: Normal heart sounds.  Pulmonary:     Effort: Pulmonary effort is normal.     Breath sounds: Normal breath sounds.  Musculoskeletal:     Cervical back: Normal range of motion and neck supple.     Right lower leg: No edema.     Left lower leg: No edema.     Right foot: Charcot foot present.     Left foot: Charcot foot present.  Feet:     Right foot:  Protective Sensation: 10 sites tested.  10 sites sensed.     Skin integrity: Callus and dry skin present.     Toenail Condition: Right toenails are abnormally thick.     Left foot:     Protective Sensation: 10 sites tested.  10 sites sensed.     Skin integrity: Callus and dry skin present.     Toenail Condition: Left toenails are abnormally thick.  Skin:    General: Skin is warm and dry.     Capillary Refill: Capillary refill takes less than 2 seconds.     Findings: Erythema, rash and wound present.       Neurological:     General: No focal deficit present.     Mental Status: She is alert and oriented to person, place, and time.  Psychiatric:        Mood and Affect: Mood normal.        Behavior: Behavior normal.        Thought Content: Thought content normal.        Judgment: Judgment normal.    Physical Exam   SKIN: Cactus spines embedded in skin. Foreign Body Removal  Date/Time: 08/12/2023 3:25 PM  Performed by: Sonny Masters, FNP Authorized by: Sonny Masters, FNP  Body area: skin General location: upper extremity Location details: left thumb  Anesthesia: Local Anesthetic: co-phenylcaine spray  Sedation: Patient sedated: no  Patient restrained: no Patient cooperative: yes Localization method: visualized Removal mechanism: scalpel Dressing: antibiotic ointment and dressing applied Tendon involvement:  none Depth: subcutaneous Complexity: simple 3 objects recovered. Objects recovered: cactus spine Post-procedure assessment: foreign body removed Patient tolerance: patient tolerated the procedure well with no immediate complications  Foreign Body Removal  Date/Time: 08/12/2023 3:26 PM  Performed by: Sonny Masters, FNP Authorized by: Sonny Masters, FNP  Body area: skin General location: upper extremity Location details: left long finger  Anesthesia: Local Anesthetic: co-phenylcaine spray  Sedation: Patient sedated: no  Patient restrained: no Patient cooperative: yes Localization method: visualized Removal mechanism: scalpel Dressing: antibiotic ointment Tendon involvement: none Depth: subcutaneous Complexity: simple 1 objects recovered. Objects recovered: cactus spine Post-procedure assessment: foreign body removed Patient tolerance: patient tolerated the procedure well with no immediate complications  Foreign Body Removal  Date/Time: 08/12/2023 3:27 PM  Performed by: Sonny Masters, FNP Authorized by: Sonny Masters, FNP  Body area: skin General location: upper extremity Location details: right long finger Anesthesia: local infiltration  Anesthesia: Local Anesthetic: co-phenylcaine spray  Sedation: Patient sedated: no  Patient restrained: no Patient cooperative: yes Localization method: visualized Removal mechanism: scalpel Dressing: antibiotic ointment Tendon involvement: none Depth: subcutaneous Complexity: simple 1 objects recovered. Objects recovered: cactus spine Post-procedure assessment: foreign body removed Patient tolerance: patient tolerated the procedure well with no immediate complications     Results for orders placed or performed in visit on 06/20/23  [facility]   Collection Time: 06/17/23 12:00 AM  Result Value Ref Range   [facility] See Report (in chart) See Report (in chart), Patient Reported       Pertinent labs &  imaging results that were available during my care of the patient were reviewed by me and considered in my medical decision making.  Assessment & Plan:  Titiana was seen today for rash, dizziness and foreign body in skin.  Diagnoses and all orders for this visit:  Type 2 diabetes mellitus with other specified complication, without long-term current use of insulin (HCC) -     CBC  with Differential/Platelet -     CMP14+EGFR -     Lipid panel -     Thyroid Panel With TSH -     Bayer DCA Hb A1c Waived -     insulin lispro (HUMALOG KWIKPEN) 100 UNIT/ML KwikPen; Use 5-10 units 2 times daily with breakfast and dinner when blood sugar is >200 on steroids -     lisinopril (ZESTRIL) 5 MG tablet; Take 1 tablet (5 mg total) by mouth daily. -     For home use only DME Other see comment  Hypertension associated with diabetes (HCC) -     CBC with Differential/Platelet -     CMP14+EGFR -     Lipid panel -     Thyroid Panel With TSH -     lisinopril (ZESTRIL) 5 MG tablet; Take 1 tablet (5 mg total) by mouth daily.  Hyperlipidemia associated with type 2 diabetes mellitus (HCC) -     CBC with Differential/Platelet -     CMP14+EGFR -     Lipid panel  COPD without exacerbation (HCC) -     CBC with Differential/Platelet  Polyneuropathy due to type 2 diabetes mellitus (HCC) -     CMP14+EGFR -     Thyroid Panel With TSH -     For home use only DME Other see comment  Vertigo -     meclizine (ANTIVERT) 25 MG tablet; Take 2 tablets (50 mg total) by mouth daily as needed for dizziness. -     methylPREDNISolone acetate (DEPO-MEDROL) injection 40 mg  Seasonal allergic rhinitis due to pollen -     Discontinue: levocetirizine (XYZAL) 5 MG tablet; Take 1 tablet (5 mg total) by mouth every evening. -     cetirizine (ZYRTEC) 10 MG tablet; Take 1 tablet (10 mg total) by mouth daily.  Genitourinary syndrome of menopause -     Discontinue: MYRBETRIQ 50 MG TB24 tablet; Take 1 tablet (50 mg total) by mouth  daily. -     MYRBETRIQ 50 MG TB24 tablet; Take 1 tablet (50 mg total) by mouth daily.  Foreign body (FB) in soft tissue -     Foreign Body Removal -     Foreign Body Removal -     Foreign Body Removal  Cellulitis of left hand -     doxycycline (VIBRA-TABS) 100 MG tablet; Take 1 tablet (100 mg total) by mouth 2 (two) times daily for 10 days. 1 po bid  Pruritic rash -     triamcinolone cream (KENALOG) 0.1 %; Apply 1 Application topically 2 (two) times daily. -     methylPREDNISolone acetate (DEPO-MEDROL) injection 40 mg  Other orders -     promethazine (PHENERGAN) 12.5 MG tablet; Take 1 tablet (12.5 mg total) by mouth every 8 (eight) hours as needed for nausea or vomiting.     Assessment and Plan    Vertigo Chronic vertigo with frequent spells causing significant functional impairment, dizziness, occasional vomiting, and recent falls. Vestibular rehabilitation discussed as a potential treatment option, though patient expressed reluctance. - Refer to vestibular rehabilitation - Prescribe meclizine for vertigo  Foreign Body in Hand Cactus spines embedded in the hand following a fall. Requires removal and wound care. Tetanus status up to date. - Remove foreign body from hand - Clean wound thoroughly - Administer doxycycline for cellulitis prophylaxis  Facial Rash New onset facial rash extending to the eye, possibly due to contact dermatitis or an allergic reaction. No new products or sun exposure reported. -  Administer steroid injection - Prescribe triamcinolone cream for topical use once daily for five days  Diabetes Mellitus Type 2 Well-controlled diabetes with A1c of 5.6. Reports urinary incontinence, likely due to diabetes-related polyneuropathy. Patient requested new diabetic shoes. - Increase Myrbetriq to 50 mg for urinary incontinence - Continue current insulin regimen - Continue continuous glucose monitoring - Order new diabetic shoes  Hypertension Well-controlled  hypertension with no reported symptoms. - Continue lisinopril as prescribed  Hyperlipidemia Reports adverse reaction to previous cholesterol medication. Currently on pravastatin with no issues. - Continue pravastatin as prescribed  COPD Well-controlled COPD with no recent exacerbations. - Continue current COPD management plan  Allergic Rhinitis Chronic allergic rhinitis with inadequate control on Xyzal. Reports persistent nasal symptoms. Advised to switch allergy medications periodically for better control. - Switch from Xyzal to Zyrtec - Prescribe Zyrtec  General Health Maintenance Up to date on tetanus, hepatitis, flu, and pneumonia vaccines. Shingles vaccine status confirmed as complete. - No additional vaccinations needed at this time  Follow-up - Follow up as needed for vertigo and vestibular rehabilitation - Monitor response to increased Myrbetriq dosage - Reassess facial rash and response to steroid treatment.        Total time spent with patient today was 45 minutes.    Continue all other maintenance medications.  Follow up plan: Return in about 3 months (around 11/12/2023) for DM.   Continue healthy lifestyle choices, including diet (rich in fruits, vegetables, and lean proteins, and low in salt and simple carbohydrates) and exercise (at least 30 minutes of moderate physical activity daily).  Educational handout given for DM  The above assessment and management plan was discussed with the patient. The patient verbalized understanding of and has agreed to the management plan. Patient is aware to call the clinic if they develop any new symptoms or if symptoms persist or worsen. Patient is aware when to return to the clinic for a follow-up visit. Patient educated on when it is appropriate to go to the emergency department.   Kari Baars, FNP-C [facility] [phone]

## 2023-08-12 NOTE — Patient Instructions (Signed)

## 2023-08-13 LAB — LIPID PANEL
Chol/HDL Ratio: 3.7 ratio (ref 0.0–4.4)
Cholesterol, Total: 180 mg/dL (ref 100–199)
HDL: 49 mg/dL (ref >39)
LDL Chol Calc (NIH): 109 mg/dL — ABNORMAL HIGH (ref 0–99)
Triglycerides: 125 mg/dL (ref 0–149)
VLDL Cholesterol Cal: 22 mg/dL (ref 5–40)

## 2023-08-13 LAB — CMP14+EGFR
ALT: 6 IU/L (ref 0–32)
AST: 17 IU/L (ref 0–40)
Albumin: 4 g/dL (ref 3.9–4.9)
Alkaline Phosphatase: 71 IU/L (ref 44–121)
BUN/Creatinine Ratio: 14 (ref 12–28)
BUN: 12 mg/dL (ref 8–27)
Bilirubin Total: 0.5 mg/dL (ref 0.0–1.2)
CO2: 25 mmol/L (ref 20–29)
Calcium: 10 mg/dL (ref 8.7–10.3)
Chloride: 103 mmol/L (ref 96–106)
Creatinine, Ser: 0.86 mg/dL (ref 0.57–1.00)
Globulin, Total: 2 g/dL (ref 1.5–4.5)
Glucose: 78 mg/dL (ref 70–99)
Potassium: 4.7 mmol/L (ref 3.5–5.2)
Sodium: 141 mmol/L (ref 134–144)
Total Protein: 6 g/dL (ref 6.0–8.5)
eGFR: 74 mL/min/{1.73_m2} (ref >59)

## 2023-08-13 LAB — CBC WITH DIFFERENTIAL/PLATELET
Basophils Absolute: 0.1 10*3/uL (ref 0.0–0.2)
Basos: 1 %
EOS (ABSOLUTE): 0.1 10*3/uL (ref 0.0–0.4)
Eos: 1 %
Hematocrit: 48.3 % — ABNORMAL HIGH (ref 34.0–46.6)
Hemoglobin: 16.3 g/dL — ABNORMAL HIGH (ref 11.1–15.9)
Immature Grans (Abs): 0 10*3/uL (ref 0.0–0.1)
Immature Granulocytes: 0 %
Lymphocytes Absolute: 2.3 10*3/uL (ref 0.7–3.1)
Lymphs: 26 %
MCH: 33.1 pg — ABNORMAL HIGH (ref 26.6–33.0)
MCHC: 33.7 g/dL (ref 31.5–35.7)
MCV: 98 fL — ABNORMAL HIGH (ref 79–97)
Monocytes Absolute: 0.5 10*3/uL (ref 0.1–0.9)
Monocytes: 6 %
Neutrophils Absolute: 6 10*3/uL (ref 1.4–7.0)
Neutrophils: 66 %
Platelets: 198 10*3/uL (ref 150–450)
RBC: 4.93 x10E6/uL (ref 3.77–5.28)
RDW: 13.3 % (ref 11.7–15.4)
WBC: 9.1 10*3/uL (ref 3.4–10.8)

## 2023-08-13 LAB — THYROID PANEL WITH TSH
Free Thyroxine Index: 2.1 (ref 1.2–4.9)
T3 Uptake Ratio: 19 % — ABNORMAL LOW (ref 24–39)
T4, Total: 10.8 ug/dL (ref 4.5–12.0)
TSH: 0.586 u[IU]/mL (ref 0.450–4.500)

## 2023-08-20 DIAGNOSIS — E1142 Type 2 diabetes mellitus with diabetic polyneuropathy: Secondary | ICD-10-CM | POA: Diagnosis not present

## 2023-08-22 DIAGNOSIS — J069 Acute upper respiratory infection, unspecified: Secondary | ICD-10-CM | POA: Diagnosis not present

## 2023-08-22 DIAGNOSIS — R42 Dizziness and giddiness: Secondary | ICD-10-CM | POA: Diagnosis not present

## 2023-08-22 DIAGNOSIS — S0031XA Abrasion of nose, initial encounter: Secondary | ICD-10-CM | POA: Diagnosis not present

## 2023-08-29 ENCOUNTER — Telehealth: Payer: Self-pay | Admitting: Family Medicine

## 2023-08-29 NOTE — Telephone Encounter (Signed)
 Copied from CRM [id number]. Topic: Clinical - Medical Advice >> Aug 29, 2023  2:46 PM [name] D wrote: Patient needs appointment with Raynelle Fanning to discuss her sugar diabetes

## 2023-08-31 DIAGNOSIS — G4733 Obstructive sleep apnea (adult) (pediatric): Secondary | ICD-10-CM | POA: Diagnosis not present

## 2023-09-02 NOTE — Telephone Encounter (Signed)
 Appt with PharmD made No answer on pt home phone Will continue to follow

## 2023-09-03 ENCOUNTER — Encounter: Payer: Self-pay | Admitting: Family Medicine

## 2023-09-03 ENCOUNTER — Ambulatory Visit: Admitting: Family Medicine

## 2023-09-08 ENCOUNTER — Ambulatory Visit: Payer: Self-pay | Admitting: Family Medicine

## 2023-09-08 ENCOUNTER — Ambulatory Visit: Admitting: Nurse Practitioner

## 2023-09-08 NOTE — Telephone Encounter (Signed)
 Has appointment scheduled today

## 2023-09-08 NOTE — Telephone Encounter (Signed)
 Copied from CRM [id number]. Topic: Clinical - Red Word Triage >> Sep 08, 2023  8:10 AM Alessandra Bevels wrote: Red Word that prompted transfer to Nurse Triage: Patient is calling to report that her left eye is swollen with no pain. Reporting allergies. And provider was supposed to send medication in but did not.   Chief Complaint: eye swelling; allergies Symptoms: LEFT eye swelling Frequency: constant Pertinent Negatives: Patient denies fever Disposition: [] ED /[] Urgent Care (no appt availability in office) / [x] Appointment(In office/virtual)/ []  [facility]/ [] Home Care/ [] Refused Recommended Disposition /[] [facility]/ []  Follow-up with PCP Additional Notes: Pt reports pollen allergy, getting worse. Reports left eye swelling this morning with watery discharge. Appt scheduled today at 1:50pm  Reason for Disposition  Eyelids are very swollen (shut or almost)  Answer Assessment - Initial Assessment Questions 1. SEVERITY: "How bad is the itching?"  (e.g., Scale 1-10; mild, moderate or severe)     No itching   2. ONSET: "When did the eye symptoms start?" (e.g., hours or days ago)     Started this morning  3. EYELIDS: "Are the eyelids swollen?" If Yes, ask: "How much?"     Left eye is completely swollen shut  4. EYE DISCHARGE: "Is there any discharge from the eye, or eyelid crusting?" If Yes, ask: "How much?"     Watery discharge  5. TRIGGER: "What do you think triggered the allergic reaction?" (e.g., dust, smoke, pollen, new eye make-up)     Pollen  6. RECURRENT PROBLEM: "Have you experienced eye allergies before?" If Yes, ask: "When was the last time?" and "What medicine worked best in the past?"     Yes., Unsure of what medication worked  7. CONTACTS: "Do you wear contacts?"     No  8. OTHER SYMPTOMS: "Do you have any other symptoms?" (e.g., runny nose)     Stuffy nose, runny nose  9. PREGNANCY: "Is there any chance you are pregnant?" "When was your last menstrual  period?"     No  Protocols used: Eye - Allergy-A-AH

## 2023-09-09 ENCOUNTER — Ambulatory Visit (INDEPENDENT_AMBULATORY_CARE_PROVIDER_SITE_OTHER): Admitting: Family Medicine

## 2023-09-09 ENCOUNTER — Encounter: Payer: Self-pay | Admitting: Family Medicine

## 2023-09-09 VITALS — BP 132/76 | HR 105 | Temp 95.8°F | Ht 69.0 in | Wt 170.2 lb

## 2023-09-09 DIAGNOSIS — J301 Allergic rhinitis due to pollen: Secondary | ICD-10-CM | POA: Diagnosis not present

## 2023-09-09 DIAGNOSIS — H1032 Unspecified acute conjunctivitis, left eye: Secondary | ICD-10-CM

## 2023-09-09 MED ORDER — CETIRIZINE HCL 10 MG PO TABS: 10.0000 mg | ORAL_TABLET | Freq: Every day | ORAL | 1 refills | Status: DC

## 2023-09-09 MED ORDER — POLYMYXIN B-TRIMETHOPRIM 10000-0.1 UNIT/ML-% OP SOLN: 1.0000 [drp] | Freq: Four times a day (QID) | OPHTHALMIC | 0 refills | Status: AC

## 2023-09-09 NOTE — Progress Notes (Signed)
 Subjective:  [name], female    DOB: Mar 03, 1956, 68 y.o.   MRN: [id number]  Patient Care Team: Sonny Masters, FNP as PCP - General (Family Medicine) Sheran Luz, MD as Consulting Physician (Physical Medicine and Rehabilitation) Archer Asa, MD as Consulting Physician (Psychiatry) Beryle Beams, MD as Consulting Physician (Neurology) Danella Maiers, [facility] (Pharmacist) Glyn Ade, PA-C as Physician Assistant (Dermatology) Daisy Lazar, DO (Optometry) Glenford Bayley, NP as Nurse Practitioner (Pulmonary Disease)   Chief Complaint:  Facial Swelling (Left eye swelling x 6 days )   HPI: Cynthia Deleon is a 68 y.o. female presenting on 09/09/2023 for Facial Swelling (Left eye swelling x 6 days )   Discussed the use of AI scribe software for clinical note transcription with the patient, who gave verbal consent to proceed.  History of Present Illness   Cynthia Deleon is a 68 year old female who presents with eye irritation and allergy symptoms.  She experiences eye irritation, which she attributes to allergies, describing her eye as 'real red'. She has difficulty sleeping due to nasal congestion, which she believes is related to her allergies. Her eye doctor is located in a different city, complicating regular care.  She describes an incident where nasal congestion caused her CPAP machine to back up, leading to disrupted sleep. As a result, she slept without the CPAP machine last night.  She is currently taking over-the-counter allergy medication but is unsure of the specific brand. She mentions taking Singulair, though there was an issue with her prescription not being called in previously. She also takes sertraline every night. She expresses frustration with the pharmacy, noting that a pharmacist seems to dislike her, which may have contributed to issues with her medication being dispensed.          Relevant past medical, surgical, family,  and social history reviewed and updated as indicated.  Allergies and medications reviewed and updated. Data reviewed: Chart in Epic.   Past Medical History:  Diagnosis Date   Aortic atherosclerosis (HCC) 10/31/2021   Arthritis    hands and knees   Bipolar 1 disorder (HCC)    Borderline diabetic    Chronic back pain    Chronic neck pain    Complication of anesthesia    high anxiety-does not want to be alone   COPD (chronic obstructive pulmonary disease) (HCC)    Current use of estrogen therapy 01/12/2014   Depression    Diabetes mellitus without complication (HCC)    Diplopia    Family history of adverse reaction to anesthesia    sister "gas in lungs"   GERD (gastroesophageal reflux disease)    Headache    Hepatic steatosis 10/31/2021   Hot flashes 12/15/2013   Hypertension    IBS (irritable bowel syndrome)    Incontinence    Kidney stone    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HCC)    Osteopenia 02/06/2022   Panic attacks    Peptic ulcer    Peripheral neuropathy    Rosacea    Shingles    Sleep apnea    uses a cpap-with oxygen    Past Surgical History:  Procedure Laterality Date   BIOPSY  04/27/2020   Procedure: BIOPSY;  Surgeon: Bernette Redbird, MD;  Location: [facility];  Service: Endoscopy;;   BUNIONECTOMY WITH HAMMERTOE RECONSTRUCTION Right 12/10/2012   Procedure: RIGHT FIRST METATARSAL CHEVRON BUNION CORRECTION,  2 AND 3 HAMMERTOE CORRECTION , RIGHT 3 AND 4 TOE NAIL  EXCISION ;  Surgeon: Toni Arthurs, MD;  Location: [facility];  Service: Orthopedics;  Laterality: Right;   COLONOSCOPY WITH PROPOFOL N/A 04/27/2020   Procedure: COLONOSCOPY WITH PROPOFOL;  Surgeon: Bernette Redbird, MD;  Location: [facility];  Service: Endoscopy;  Laterality: N/A;   COLONOSCOPY WITH PROPOFOL N/A 06/17/2023   Procedure: COLONOSCOPY WITH PROPOFOL;  Surgeon: Dolores Frame, MD;  Location: [facility];  Service: Gastroenterology;  Laterality: N/A;  2:30PM;ASA 3    FOOT ARTHRODESIS  2000   both feet   LIPOSUCTION  03/2018   POLYPECTOMY  06/17/2023   Procedure: POLYPECTOMY;  Surgeon: Dolores Frame, MD;  Location: [facility];  Service: Gastroenterology;;   RECTAL SURGERY     TONSILLECTOMY     TOTAL ABDOMINAL HYSTERECTOMY      Social History   Socioeconomic History   Marital status: Divorced    Spouse name: Not on file   Number of children: 5   Years of education: 9th   Highest education level: Not on file  Occupational History   Occupation: Disabled  Tobacco Use   Smoking status: Former    Current packs/day: 0.25    Average packs/day: 1 pack/day for 43.2 years (42.3 ttl pk-yrs)    Types: Cigarettes    Start date: 06/11/1975    Quit date: 06/10/2017   Smokeless tobacco: Never  Vaping Use   Vaping status: Never Used  Substance and Sexual Activity   Alcohol use: No    Alcohol/week: 0.0 standard drinks of alcohol   Drug use: No   Sexual activity: Not Currently    Birth control/protection: Surgical    Comment: hyst  Other Topics Concern   Not on file  Social History Narrative   Lives at home with home with her son (has five children).   Right-handed.   1 cup caffeine per day.   Social Drivers of Corporate investment banker Strain: Low Risk  (08/21/2022)   Overall Financial Resource Strain (CARDIA)    Difficulty of Paying Living Expenses: Not hard at all  Food Insecurity: No Food Insecurity (08/21/2022)   Hunger Vital Sign    Worried About Running Out of Food in the Last Year: Never true    Ran Out of Food in the Last Year: Never true  Transportation Needs: No Transportation Needs (08/21/2022)   PRAPARE - Administrator, Civil Service (Medical): No    Lack of Transportation (Non-Medical): No  Physical Activity: Inactive (08/21/2022)   Exercise Vital Sign    Days of Exercise per Week: 0 days    Minutes of Exercise per Session: 0 min  Stress: No Stress Concern Present (08/21/2022)   [employer] of  Occupational Health - Occupational Stress Questionnaire    Feeling of Stress : Not at all  Social Connections: Moderately Integrated (08/21/2022)   Social Connection and Isolation Panel [NHANES]    Frequency of Communication with Friends and Family: More than three times a week    Frequency of Social Gatherings with Friends and Family: More than three times a week    Attends Religious Services: More than 4 times per year    Active Member of [employer] or Organizations: Yes    Attends Banker Meetings: More than 4 times per year    Marital Status: Divorced  Intimate Partner Violence: Not At Risk (08/21/2022)   Humiliation, Afraid, Rape, and Kick questionnaire    Fear of Current or Ex-Partner: No    Emotionally  Abused: No    Physically Abused: No    Sexually Abused: No    Outpatient Encounter Medications as of 09/09/2023  Medication Sig   albuterol (VENTOLIN HFA) 108 (90 Base) MCG/ACT inhaler Inhale 1-2 puffs into the lungs every 6 (six) hours as needed for wheezing or shortness of breath.   ARIPiprazole (ABILIFY) 10 MG tablet Take 10 mg by mouth every morning.   Artificial Saliva (BIOTENE DRY MOUTH MOISTURIZING) SOLN Use as directed 5 mLs in the mouth or throat in the morning and at bedtime.   aspirin EC 81 MG tablet Take 81 mg by mouth daily. Swallow whole.   atorvastatin (LIPITOR) 80 MG tablet Take 1 tablet (80 mg total) by mouth daily. (Patient taking differently: Take 80 mg by mouth daily. Not taking)   cholecalciferol (VITAMIN D) 25 MCG tablet Take 1 tablet (1,000 Units total) by mouth daily.   Continuous Glucose Receiver (FREESTYLE LIBRE 3 READER) DEVI with compatible Freestyle Libre 3 sensor to monitor glucose continuously. DX: E11.65   Continuous Glucose Sensor (FREESTYLE LIBRE 3 PLUS SENSOR) MISC Change sensor every 15 days. DX: E11.65   diazepam (VALIUM) 5 MG tablet Take 5 mg by mouth 2 (two) times daily.   DULoxetine (CYMBALTA) 60 MG capsule TAKE (1) CAPSULE BY MOUTH EVERY  DAY. (Patient taking differently: Not taking)   estrogens, conjugated, (PREMARIN) 0.3 MG tablet TAKE (1) TABLET BY MOUTH ONCE DAILY.   ezetimibe (ZETIA) 10 MG tablet TAKE (1) TABLET BY MOUTH ONCE DAILY.   fluorometholone (FML) 0.1 % ophthalmic suspension SMARTSIG:In Eye(s)   fluticasone (FLONASE) 50 MCG/ACT nasal spray USE 2 SPRAYS IN EACH NOSTRIL ONCE DAILY. (Patient taking differently: Place 2 sprays into both nostrils daily.)   hyoscyamine (LEVSIN SL) 0.125 MG SL tablet Place 1 tablet (0.125 mg total) under the tongue every 8 (eight) hours as needed (abdominal pain).   insulin lispro (HUMALOG KWIKPEN) 100 UNIT/ML KwikPen Use 5-10 units 2 times daily with breakfast and dinner when blood sugar is >200 on steroids   Insulin Pen Needle (PIP PEN NEEDLES 32G X ) 32G X 4 MM MISC Use to inject insulin 2 times daily as needed. DX E11.65   lamoTRIgine (LAMICTAL) 200 MG tablet Take 200 mg by mouth at bedtime. For mood disorder.   lansoprazole (PREVACID) 30 MG capsule TAKE (1) CAPSULE BY MOUTH ONCE DAILY.   lisinopril (ZESTRIL) 5 MG tablet Take 1 tablet (5 mg total) by mouth daily.   meclizine (ANTIVERT) 25 MG tablet Take 2 tablets (50 mg total) by mouth daily as needed for dizziness.   montelukast (SINGULAIR) 10 MG tablet TAKE (1) TABLET BY MOUTH AT BEDTIME.   MYRBETRIQ 50 MG TB24 tablet Take 1 tablet (50 mg total) by mouth daily.   omeprazole (PRILOSEC) 40 MG capsule Take 1 capsule (40 mg total) by mouth daily.   OneTouch Delica Lancets 30G MISC Use to test blood sugar twice daily. DX: E11.9   oxyCODONE (ROXICODONE) 15 MG immediate release tablet Take 1 tablet (15 mg total) by mouth every 6 (six) hours as needed for pain.   pregabalin (LYRICA) 100 MG capsule Take 2 capsules by mouth 3 (three) times daily.   promethazine (PHENERGAN) 12.5 MG tablet Take 1 tablet (12.5 mg total) by mouth every 8 (eight) hours as needed for nausea or vomiting.   tirzepatide (MOUNJARO) 12.5 MG/0.5ML Pen Inject 12.5 mg into  the skin once a week. DX: E11.65   triamcinolone cream (KENALOG) 0.1 % Apply 1 Application topically 2 (two) times  daily.   trimethoprim-polymyxin b (POLYTRIM) ophthalmic solution Place 1 drop into the left eye in the morning, at noon, in the evening, and at bedtime for 7 days.   umeclidinium-vilanterol (ANORO ELLIPTA) 62.5-25 MCG/ACT AEPB Inhale 1 puff into the lungs daily.   valACYclovir (VALTREX) 1000 MG tablet Take 1 tablet (1,000 mg total) by mouth daily.   vitamin B-12 1000 MCG tablet Take 1 tablet (1,000 mcg total) by mouth daily.   VRAYLAR 3 MG capsule Take 3 mg by mouth at bedtime.   [DISCONTINUED] cetirizine (ZYRTEC) 10 MG tablet Take 1 tablet (10 mg total) by mouth daily.   cetirizine (ZYRTEC) 10 MG tablet Take 1 tablet (10 mg total) by mouth daily.   No facility-administered encounter medications on file as of 09/09/2023.    Allergies  Allergen Reactions   Divalproex Sodium Other (See Comments)    Hallucinations   Other reaction(s): Confusion  Hallucinations     Other reaction(s): Confusion    Hallucinations   Other Anaphylaxis, Other (See Comments), Nausea And Vomiting and Nausea Only   Pseudoeph-Hydrocodone-Gg Nausea Only   Sulfa Antibiotics Anaphylaxis, Nausea Only, Other (See Comments) and Swelling    Swelling of tongue.   Valproic Acid Other (See Comments)   Varenicline Other (See Comments)    Altered mental status-Per patient was put on allergy list by Dr Delbert Harness bu patient staes she is not allergic to this and is currently taking it.   Varenicline Tartrate Other (See Comments)    Other reaction(s): Confusion   Codeine Nausea And Vomiting and Other (See Comments)    Other reaction(s): vomiting   Hydrocodone Nausea And Vomiting and Other (See Comments)   Acetaminophen Nausea Only   Ambien [Zolpidem] Other (See Comments)    Causes sleep walking   Clavulanic Acid Diarrhea   Elemental Sulfur Other (See Comments)    Swelling of tongue   Hydrocod Poli-Chlorphe  Poli Er Other (See Comments)    Other reaction(s): Skin itches    Macrobid [Nitrofurantoin] Nausea And Vomiting    Dizziness, nausea, vomiting   Paxil [Paroxetine Hcl] Other (See Comments)    Causes ringing in the ears.    Prednisone Other (See Comments)    Other reaction(s): Unknown    Sulfur Other (See Comments)    sulfur   Amoxicillin Rash    Has patient had a PCN reaction causing immediate rash, facial/tongue/throat swelling, SOB or lightheadedness with hypotension: No Has patient had a PCN reaction causing severe rash involving mucus membranes or skin necrosis: No Has patient had a PCN reaction that required hospitalization: No Has patient had a PCN reaction occurring within the last 10 years: Yes If all of the above answers are "NO", then may proceed with Cephalosporin use.    Farxiga [Dapagliflozin]     RECURRENT YEAST/UTI   Metformin And Related Diarrhea   Tape Rash    Pertinent ROS per HPI, otherwise unremarkable      Objective:  BP 132/76   Pulse (!) 105   Temp (!) 95.8 F (35.4 C)   Ht 5\' 9"  (1.753 m)   Wt 170 lb 3.2 oz (77.2 kg)   SpO2 96%   BMI 25.13 kg/m    Wt Readings from Last 3 Encounters:  09/09/23 170 lb 3.2 oz (77.2 kg)  08/12/23 162 lb 12.8 oz (73.8 kg)  06/30/23 167 lb (75.8 kg)    Physical Exam Vitals and nursing note reviewed.  Constitutional:      General: She is not  in acute distress.    Appearance: Normal appearance. She is not ill-appearing, toxic-appearing or diaphoretic.  HENT:     Head: Normocephalic and atraumatic.     Right Ear: Ear canal and external ear normal. A middle ear effusion is present.     Left Ear: Ear canal and external ear normal. A middle ear effusion is present.     Nose: Rhinorrhea present. Rhinorrhea is clear.     Right Turbinates: Enlarged.     Left Turbinates: Enlarged.     Right Sinus: No maxillary sinus tenderness or frontal sinus tenderness.     Left Sinus: No maxillary sinus tenderness or frontal sinus  tenderness.     Mouth/Throat:     Lips: Pink.     Mouth: Mucous membranes are moist.     Pharynx: Oropharynx is clear. Postnasal drip present. No pharyngeal swelling, oropharyngeal exudate, posterior oropharyngeal erythema or uvula swelling.  Eyes:     Extraocular Movements: Extraocular movements intact.     Conjunctiva/sclera:     Left eye: Left conjunctiva is injected. Exudate (scant) present. No chemosis or hemorrhage.    Pupils: Pupils are equal, round, and reactive to light.  Cardiovascular:     Rate and Rhythm: Normal rate and regular rhythm.     Heart sounds: Normal heart sounds.  Pulmonary:     Effort: Pulmonary effort is normal.     Breath sounds: Normal breath sounds.  Neurological:     Mental Status: She is alert.  Psychiatric:        Behavior: Behavior is cooperative.      Results for orders placed or performed in visit on 08/12/23  Bayer DCA Hb A1c Waived   Collection Time: 08/12/23  2:09 PM  Result Value Ref Range   HB A1C (BAYER DCA - WAIVED) 5.6 4.8 - 5.6 %  CBC with Differential/Platelet   Collection Time: 08/12/23  2:11 PM  Result Value Ref Range   WBC 9.1 3.4 - 10.8 x10E3/uL   RBC 4.93 3.77 - 5.28 x10E6/uL   Hemoglobin 16.3 (H) 11.1 - 15.9 g/dL   Hematocrit 16.1 (H) 09.6 - 46.6 %   MCV 98 (H) 79 - 97 fL   MCH 33.1 (H) 26.6 - 33.0 pg   MCHC 33.7 31.5 - 35.7 g/dL   RDW 04.5 40.9 - 81.1 %   Platelets 198 150 - 450 x10E3/uL   Neutrophils 66 Not Estab. %   Lymphs 26 Not Estab. %   Monocytes 6 Not Estab. %   Eos 1 Not Estab. %   Basos 1 Not Estab. %   Neutrophils Absolute 6.0 1.4 - 7.0 x10E3/uL   Lymphocytes Absolute 2.3 0.7 - 3.1 x10E3/uL   Monocytes Absolute 0.5 0.1 - 0.9 x10E3/uL   EOS (ABSOLUTE) 0.1 0.0 - 0.4 x10E3/uL   Basophils Absolute 0.1 0.0 - 0.2 x10E3/uL   Immature Granulocytes 0 Not Estab. %   Immature Grans (Abs) 0.0 0.0 - 0.1 x10E3/uL  CMP14+EGFR   Collection Time: 08/12/23  2:11 PM  Result Value Ref Range   Glucose 78 70 - 99 mg/dL    BUN 12 8 - 27 mg/dL   Creatinine, Ser 9.14 0.57 - 1.00 mg/dL   eGFR 74 >78 GN/FAO/1.30   BUN/Creatinine Ratio 14 12 - 28   Sodium 141 134 - 144 mmol/L   Potassium 4.7 3.5 - 5.2 mmol/L   Chloride 103 96 - 106 mmol/L   CO2 25 20 - 29 mmol/L   Calcium 10.0 8.7 -  10.3 mg/dL   Total Protein 6.0 6.0 - 8.5 g/dL   Albumin 4.0 3.9 - 4.9 g/dL   Globulin, Total 2.0 1.5 - 4.5 g/dL   Bilirubin Total 0.5 0.0 - 1.2 mg/dL   Alkaline Phosphatase 71 44 - 121 IU/L   AST 17 0 - 40 IU/L   ALT 6 0 - 32 IU/L  Lipid panel   Collection Time: 08/12/23  2:11 PM  Result Value Ref Range   Cholesterol, Total 180 100 - 199 mg/dL   Triglycerides 161 0 - 149 mg/dL   HDL 49 >09 mg/dL   VLDL Cholesterol Cal 22 5 - 40 mg/dL   LDL Chol Calc (NIH) 604 (H) 0 - 99 mg/dL   Chol/HDL Ratio 3.7 0.0 - 4.4 ratio  Thyroid Panel With TSH   Collection Time: 08/12/23  2:11 PM  Result Value Ref Range   TSH 0.586 0.450 - 4.500 uIU/mL   T4, Total 10.8 4.5 - 12.0 ug/dL   T3 Uptake Ratio 19 (L) 24 - 39 %   Free Thyroxine Index 2.1 1.2 - 4.9       Pertinent labs & imaging results that were available during my care of the patient were reviewed by me and considered in my medical decision making.  Assessment & Plan:  Cynthia Deleon was seen today for facial swelling.  Diagnoses and all orders for this visit:  Acute bacterial conjunctivitis of left eye -     trimethoprim-polymyxin b (POLYTRIM) ophthalmic solution; Place 1 drop into the left eye in the morning, at noon, in the evening, and at bedtime for 7 days.  Seasonal allergic rhinitis due to pollen -     cetirizine (ZYRTEC) 10 MG tablet; Take 1 tablet (10 mg total) by mouth daily.     Assessment and Plan    Conjunctivitis Presents with conjunctivitis, characterized by redness and infection in the eye, likely acute and related to allergies due to concurrent allergy symptoms. - Prescribe Polytrim eye drops to be used four times a day for seven days.  Allergic  Rhinitis Exhibits symptoms of allergic rhinitis, including nasal congestion and rhinorrhea, interfering with CPAP machine use. Currently using an unspecified over-the-counter allergy medication. Discussed allergy testing and potential immunotherapy if specific allergens are identified. - Send prescription for allergy medication to the pharmacy with instructions to ensure receipt. - Consider referral to an allergist for testing and potential immunotherapy.  General Health Maintenance No further blood work needed at this time following recent A1c check. - Schedule follow-up appointment in three months.          Continue all other maintenance medications.  Follow up plan: Return in 3 months (on 12/09/2023), or if symptoms worsen or fail to improve, for chronic follow up.   Continue healthy lifestyle choices, including diet (rich in fruits, vegetables, and lean proteins, and low in salt and simple carbohydrates) and exercise (at least 30 minutes of moderate physical activity daily).   The above assessment and management plan was discussed with the patient. The patient verbalized understanding of and has agreed to the management plan. Patient is aware to call the clinic if they develop any new symptoms or if symptoms persist or worsen. Patient is aware when to return to the clinic for a follow-up visit. Patient educated on when it is appropriate to go to the emergency department.   Kari Baars, FNP-C [facility] [phone]

## 2023-09-11 ENCOUNTER — Other Ambulatory Visit

## 2023-09-17 DIAGNOSIS — M25511 Pain in right shoulder: Secondary | ICD-10-CM | POA: Diagnosis not present

## 2023-09-24 ENCOUNTER — Other Ambulatory Visit (INDEPENDENT_AMBULATORY_CARE_PROVIDER_SITE_OTHER): Admitting: Pharmacist

## 2023-09-24 NOTE — Progress Notes (Signed)
   Patient called to discuss Libre 3 PLUS CGM.  Her sensor would not program.  She requested new sample of FSL3.  Patient states her BG is under control and within normal limits. No hypoglycemia reported.  She states she has not had any episodes of vertigo since last PCP visit.  Will see patient tomorrow.   Branndon Tuite Dattero Shabre Kreher, PharmD, BCACP, CPP Clinical Pharmacist, [facility]

## 2023-10-01 DIAGNOSIS — G4733 Obstructive sleep apnea (adult) (pediatric): Secondary | ICD-10-CM | POA: Diagnosis not present

## 2023-10-02 DIAGNOSIS — M5416 Radiculopathy, lumbar region: Secondary | ICD-10-CM | POA: Diagnosis not present

## 2023-10-08 ENCOUNTER — Other Ambulatory Visit: Payer: Self-pay | Admitting: Neurology

## 2023-10-08 DIAGNOSIS — E1169 Type 2 diabetes mellitus with other specified complication: Secondary | ICD-10-CM | POA: Diagnosis not present

## 2023-10-16 DIAGNOSIS — R918 Other nonspecific abnormal finding of lung field: Secondary | ICD-10-CM | POA: Diagnosis not present

## 2023-10-16 DIAGNOSIS — Z20822 Contact with and (suspected) exposure to covid-19: Secondary | ICD-10-CM | POA: Diagnosis not present

## 2023-10-16 DIAGNOSIS — J40 Bronchitis, not specified as acute or chronic: Secondary | ICD-10-CM | POA: Diagnosis not present

## 2023-10-16 DIAGNOSIS — R062 Wheezing: Secondary | ICD-10-CM | POA: Diagnosis not present

## 2023-10-16 DIAGNOSIS — J0100 Acute maxillary sinusitis, unspecified: Secondary | ICD-10-CM | POA: Diagnosis not present

## 2023-10-16 DIAGNOSIS — J984 Other disorders of lung: Secondary | ICD-10-CM | POA: Diagnosis not present

## 2023-10-16 DIAGNOSIS — R059 Cough, unspecified: Secondary | ICD-10-CM | POA: Diagnosis not present

## 2023-10-19 ENCOUNTER — Emergency Department (HOSPITAL_COMMUNITY)

## 2023-10-19 ENCOUNTER — Emergency Department (HOSPITAL_COMMUNITY)
Admission: EM | Admit: 2023-10-19 | Discharge: 2023-10-19 | Disposition: A | Discharge status: Home / Self Care | Attending: Emergency Medicine | Admitting: Emergency Medicine

## 2023-10-19 ENCOUNTER — Encounter (HOSPITAL_COMMUNITY): Payer: Self-pay | Admitting: *Deleted

## 2023-10-19 ENCOUNTER — Other Ambulatory Visit: Payer: Self-pay

## 2023-10-19 DIAGNOSIS — D72829 Elevated white blood cell count, unspecified: Secondary | ICD-10-CM | POA: Diagnosis not present

## 2023-10-19 DIAGNOSIS — Z7982 Long term (current) use of aspirin: Secondary | ICD-10-CM | POA: Diagnosis not present

## 2023-10-19 DIAGNOSIS — R059 Cough, unspecified: Secondary | ICD-10-CM | POA: Diagnosis present

## 2023-10-19 DIAGNOSIS — Z794 Long term (current) use of insulin: Secondary | ICD-10-CM | POA: Insufficient documentation

## 2023-10-19 DIAGNOSIS — J441 Chronic obstructive pulmonary disease with (acute) exacerbation: Secondary | ICD-10-CM | POA: Insufficient documentation

## 2023-10-19 DIAGNOSIS — R0989 Other specified symptoms and signs involving the circulatory and respiratory systems: Secondary | ICD-10-CM | POA: Diagnosis not present

## 2023-10-19 LAB — CBC WITH DIFFERENTIAL/PLATELET
Abs Immature Granulocytes: 0.19 10*3/uL — ABNORMAL HIGH (ref 0.00–0.07)
Basophils Absolute: 0 10*3/uL (ref 0.0–0.1)
Basophils Relative: 0 %
Eosinophils Absolute: 0 10*3/uL (ref 0.0–0.5)
Eosinophils Relative: 0 %
HCT: 44.1 % (ref 36.0–46.0)
Hemoglobin: 15.2 g/dL — ABNORMAL HIGH (ref 12.0–15.0)
Immature Granulocytes: 1 %
Lymphocytes Relative: 9 %
Lymphs Abs: 1.4 10*3/uL (ref 0.7–4.0)
MCH: 34 pg (ref 26.0–34.0)
MCHC: 34.5 g/dL (ref 30.0–36.0)
MCV: 98.7 fL (ref 80.0–100.0)
Monocytes Absolute: 0.7 10*3/uL (ref 0.1–1.0)
Monocytes Relative: 5 %
Neutro Abs: 12.6 10*3/uL — ABNORMAL HIGH (ref 1.7–7.7)
Neutrophils Relative %: 85 %
Platelets: 235 10*3/uL (ref 150–400)
RBC: 4.47 MIL/uL (ref 3.87–5.11)
RDW: 13.4 % (ref 11.5–15.5)
WBC: 14.9 10*3/uL — ABNORMAL HIGH (ref 4.0–10.5)
nRBC: 0 % (ref 0.0–0.2)

## 2023-10-19 LAB — COMPREHENSIVE METABOLIC PANEL WITH GFR
ALT: 11 U/L (ref 0–44)
AST: 16 U/L (ref 15–41)
Albumin: 3.7 g/dL (ref 3.5–5.0)
Alkaline Phosphatase: 86 U/L (ref 38–126)
Anion gap: 11 (ref 5–15)
BUN: 17 mg/dL (ref 8–23)
CO2: 21 mmol/L — ABNORMAL LOW (ref 22–32)
Calcium: 9.7 mg/dL (ref 8.9–10.3)
Chloride: 103 mmol/L (ref 98–111)
Creatinine, Ser: 0.88 mg/dL (ref 0.44–1.00)
GFR, Estimated: >60 mL/min (ref >60)
Glucose, Bld: 343 mg/dL — ABNORMAL HIGH (ref 70–99)
Potassium: 4.8 mmol/L (ref 3.5–5.1)
Sodium: 135 mmol/L (ref 135–145)
Total Bilirubin: 0.5 mg/dL (ref 0.0–1.2)
Total Protein: 6.4 g/dL — ABNORMAL LOW (ref 6.5–8.1)

## 2023-10-19 LAB — CBG MONITORING, ED: Glucose-Capillary: 304 mg/dL — ABNORMAL HIGH (ref 70–99)

## 2023-10-19 MED ORDER — MAGNESIUM SULFATE 2 GM/50ML IV SOLN
2.0000 g | Freq: Once | INTRAVENOUS | Status: AC
  Administered 2023-10-19: 2 g via INTRAVENOUS
  Filled 2023-10-19: qty 50

## 2023-10-19 MED ORDER — ALBUTEROL SULFATE (2.5 MG/3ML) 0.083% IN NEBU
2.5000 mg | INHALATION_SOLUTION | Freq: Once | RESPIRATORY_TRACT | Status: AC
  Administered 2023-10-19: 2.5 mg via RESPIRATORY_TRACT
  Filled 2023-10-19: qty 3

## 2023-10-19 MED ORDER — PREDNISONE 10 MG PO TABS: 20.0000 mg | ORAL_TABLET | Freq: Every day | ORAL | 0 refills | Status: DC

## 2023-10-19 MED ORDER — IPRATROPIUM-ALBUTEROL 0.5-2.5 (3) MG/3ML IN SOLN
3.0000 mL | Freq: Once | RESPIRATORY_TRACT | Status: AC
Start: 2023-10-19 — End: 2023-10-19
  Administered 2023-10-19: 3 mL via RESPIRATORY_TRACT
  Filled 2023-10-19: qty 3

## 2023-10-19 MED ORDER — METHYLPREDNISOLONE SODIUM SUCC 125 MG IJ SOLR
125.0000 mg | Freq: Once | INTRAMUSCULAR | Status: AC
  Administered 2023-10-19: 125 mg via INTRAVENOUS
  Filled 2023-10-19: qty 2

## 2023-10-19 MED ORDER — ALBUTEROL SULFATE HFA 108 (90 BASE) MCG/ACT IN AERS
2.0000 | INHALATION_SPRAY | Freq: Once | RESPIRATORY_TRACT | Status: AC
  Administered 2023-10-19: 2 via RESPIRATORY_TRACT
  Filled 2023-10-19: qty 6.7

## 2023-10-19 NOTE — Discharge Instructions (Signed)
 Use the albuterol  inhaler every 6 hours as needed for shortness of breath.  Drink plenty of fluids.  Continue taking your Zithromax .  Follow-up with your family doctor next week if any problems

## 2023-10-19 NOTE — ED Triage Notes (Addendum)
 Pt states she was dx'd with PNA last week at [facility].  Pt states she was given a Z-pack and prednisone . Pt was on cruise ship for 7 days started on April 27th.  +NP cough-states she is unable to cough anything up.

## 2023-10-19 NOTE — ED Provider Notes (Signed)
 [place] EMERGENCY DEPARTMENT AT [facility] Provider Note   CSN: [id number] Arrival date & time: 10/19/23  1025     History {Add pertinent medical, surgical, social history, OB history to HPI:1} No chief complaint on file.   Cynthia Deleon is a 68 y.o. female.  Patient complains of cough and shortness of breath.  She was placed on Zithromax  for pneumonia.   Shortness of Breath      Home Medications Prior to Admission medications   Medication Sig Start Date End Date Taking? Authorizing Provider  predniSONE  (DELTASONE ) 10 MG tablet Take 2 tablets (20 mg total) by mouth daily. 10/19/23  Yes Cheyenne Cotta, MD  albuterol  (VENTOLIN  HFA) 108 (90 Base) MCG/ACT inhaler Inhale 1-2 puffs into the lungs every 6 (six) hours as needed for wheezing or shortness of breath. 06/19/23   Galvin Jules, FNP  ARIPiprazole  (ABILIFY ) 10 MG tablet Take 10 mg by mouth every morning. 10/19/22   [provider]  Artificial Saliva (BIOTENE DRY MOUTH MOISTURIZING) SOLN Use as directed 5 mLs in the mouth or throat in the morning and at bedtime. 05/22/22   Galvin Jules, FNP  aspirin EC 81 MG tablet Take 81 mg by mouth daily. Swallow whole.    [provider]  atorvastatin  (LIPITOR) 80 MG tablet Take 1 tablet (80 mg total) by mouth daily. Patient taking differently: Take 80 mg by mouth daily. Not taking 01/08/23   Harrold Lincoln, MD  cetirizine  (ZYRTEC ) 10 MG tablet Take 1 tablet (10 mg total) by mouth daily. 09/09/23   Galvin Jules, FNP  cholecalciferol  (VITAMIN D ) 25 MCG tablet Take 1 tablet (1,000 Units total) by mouth daily. 11/01/21   Lonita Roach, MD  Continuous Glucose Receiver (FREESTYLE LIBRE 3 READER) DEVI with compatible Freestyle Libre 3 sensor to monitor glucose continuously. DX: E11.65 08/05/23   Galvin Jules, FNP  Continuous Glucose Sensor (FREESTYLE LIBRE 3 PLUS SENSOR) MISC Change sensor every 15 days. DX: E11.65 08/05/23   Galvin Jules, FNP   diazepam  (VALIUM ) 5 MG tablet Take 5 mg by mouth 2 (two) times daily. 12/19/21   [provider]  DULoxetine  (CYMBALTA ) 60 MG capsule TAKE (1) CAPSULE BY MOUTH EVERY DAY. Patient taking differently: Not taking 12/18/22   Phebe Brasil, MD  estrogens , conjugated, (PREMARIN ) 0.3 MG tablet TAKE (1) TABLET BY MOUTH ONCE DAILY. 07/28/23   Galvin Jules, FNP  ezetimibe  (ZETIA ) 10 MG tablet TAKE (1) TABLET BY MOUTH ONCE DAILY. 07/09/23   Harrold Lincoln, MD  fluorometholone (FML) 0.1 % ophthalmic suspension SMARTSIG:In Eye(s) 10/03/21   [provider]  fluticasone  (FLONASE ) 50 MCG/ACT nasal spray USE 2 SPRAYS IN EACH NOSTRIL ONCE DAILY. Patient taking differently: Place 2 sprays into both nostrils daily. 10/11/21   Zorita Hiss, FNP  hyoscyamine  (LEVSIN SL) 0.125 MG SL tablet Place 1 tablet (0.125 mg total) under the tongue every 8 (eight) hours as needed (abdominal pain). 06/17/23   Castaneda Mayorga, Daniel, MD  insulin  lispro (HUMALOG  KWIKPEN) 100 UNIT/ML KwikPen Use 5-10 units 2 times daily with breakfast and dinner when blood sugar is >200 on steroids 08/12/23   Rakes, Georgeann Kindred, FNP  Insulin  Pen Needle (PIP PEN NEEDLES 32G X ) 32G X 4 MM MISC Use to inject insulin  2 times daily as needed. DX E11.65 04/25/22   Galvin Jules, FNP  lamoTRIgine  (LAMICTAL ) 200 MG tablet Take 200 mg by mouth at bedtime. For mood disorder.  [provider]  lansoprazole  (PREVACID ) 30 MG capsule TAKE (1) CAPSULE BY MOUTH ONCE DAILY. 07/30/23   Galvin Jules, FNP  lisinopril  (ZESTRIL ) 5 MG tablet Take 1 tablet (5 mg total) by mouth daily. 08/12/23   Galvin Jules, FNP  meclizine  (ANTIVERT ) 25 MG tablet Take 2 tablets (50 mg total) by mouth daily as needed for dizziness. 08/12/23   Galvin Jules, FNP  montelukast  (SINGULAIR ) 10 MG tablet TAKE (1) TABLET BY MOUTH AT BEDTIME. 06/30/23   Antonio Baumgarten, NP  MYRBETRIQ  50 MG TB24 tablet Take 1 tablet (50 mg total) by mouth daily. 08/12/23   Galvin Jules, FNP  omeprazole  (PRILOSEC) 40 MG capsule Take 1 capsule (40 mg total) by mouth daily. 06/17/23   Urban Garden, MD  OneTouch Delica Lancets 30G MISC Use to test blood sugar twice daily. DX: E11.9 02/28/20   Zorita Hiss, FNP  oxyCODONE  (ROXICODONE ) 15 MG immediate release tablet Take 1 tablet (15 mg total) by mouth every 6 (six) hours as needed for pain. 11/23/21   Mason Sole, Pratik D, DO  pregabalin  (LYRICA ) 100 MG capsule Take 2 capsules by mouth 3 (three) times daily. 07/28/23   [provider]  promethazine  (PHENERGAN ) 12.5 MG tablet Take 1 tablet (12.5 mg total) by mouth every 8 (eight) hours as needed for nausea or vomiting. 08/12/23   Galvin Jules, FNP  tirzepatide  (MOUNJARO ) 12.5 MG/0.5ML Pen Inject 12.5 mg into the skin once a week. DX: E11.65 08/05/23   Galvin Jules, FNP  triamcinolone  cream (KENALOG ) 0.1 % Apply 1 Application topically 2 (two) times daily. 08/12/23   Galvin Jules, FNP  umeclidinium-vilanterol (ANORO ELLIPTA ) 62.5-25 MCG/ACT AEPB Inhale 1 puff into the lungs daily. 06/30/23   Antonio Baumgarten, NP  valACYclovir  (VALTREX ) 1000 MG tablet Take 1 tablet (1,000 mg total) by mouth daily. 10/28/22   Javan Messing, NP  vitamin B-12 1000 MCG tablet Take 1 tablet (1,000 mcg total) by mouth daily. 11/01/21   Lonita Roach, MD  VRAYLAR 3 MG capsule Take 3 mg by mouth at bedtime. 10/19/22   [provider]      Allergies    Divalproex sodium, Other, Pseudoeph-hydrocodone -gg, Sulfa antibiotics, Valproic acid, Varenicline, Varenicline tartrate, Codeine, Hydrocodone , Acetaminophen , Ambien  [zolpidem ], Clavulanic acid, Elemental sulfur, Hydrocod poli-chlorphe poli er, Macrobid  [nitrofurantoin ], Paxil [paroxetine hcl], Prednisone , Sulfur, Amoxicillin , Farxiga  [dapagliflozin ], Metformin  and related, and Tape    Review of Systems   Review of Systems  Respiratory:  Positive for shortness of breath.     Physical Exam Updated Vital Signs BP 117/74    Pulse 93   Temp 98 F (36.7 C) (Oral)   Resp 16   Ht 5\' 9"  (1.753 m)   Wt 77.1 kg   SpO2 93%   BMI 25.10 kg/m  Physical Exam  ED Results / Procedures / Treatments   Labs (all labs ordered are listed, but only abnormal results are displayed) Labs Reviewed  COMPREHENSIVE METABOLIC PANEL WITH GFR - Abnormal; Notable for the following components:      Result Value   CO2 21 (*)    Glucose, Bld 343 (*)    Total Protein 6.4 (*)    All other components within normal limits  CBC WITH DIFFERENTIAL/PLATELET - Abnormal; Notable for the following components:   WBC 14.9 (*)    Hemoglobin 15.2 (*)    Neutro Abs 12.6 (*)    Abs Immature Granulocytes 0.19 (*)    All  other components within normal limits  CBG MONITORING, ED - Abnormal; Notable for the following components:   Glucose-Capillary 304 (*)    All other components within normal limits  CBC WITH DIFFERENTIAL/PLATELET    EKG None  Radiology DG Chest Portable 1 View Result Date: 10/19/2023 CLINICAL DATA:  Pneumonia EXAM: PORTABLE CHEST 1 VIEW COMPARISON:  10/16/2023 FINDINGS: Improved aeration of the right lung base. No focal consolidation. Mild pulmonary vascular congestion. No pneumothorax or pleural effusion. Normal cardiac contours. IMPRESSION: Improved aeration of the right lung base. No acute airspace disease. Electronically Signed   By: Juanetta Nordmann M.D.   On: 10/19/2023 11:32    Procedures Procedures  {Document cardiac monitor, telemetry assessment procedure when appropriate:1}  Medications Ordered in ED Medications  albuterol  (VENTOLIN  HFA) 108 (90 Base) MCG/ACT inhaler 2 puff (has no administration in time range)  ipratropium-albuterol  (DUONEB) 0.5-2.5 (3) MG/3ML nebulizer solution 3 mL (3 mLs Nebulization Given 10/19/23 1244)  albuterol  (PROVENTIL ) (2.5 MG/3ML) 0.083% nebulizer solution 2.5 mg (2.5 mg Nebulization Given 10/19/23 1244)  magnesium  sulfate IVPB 2 g 50 mL (0 g Intravenous Stopped 10/19/23 1408)   methylPREDNISolone  sodium succinate (SOLU-MEDROL ) 125 mg/2 mL injection 125 mg (125 mg Intravenous Given 10/19/23 1237)    ED Course/ Medical Decision Making/ A&P   {   Click here for ABCD2, HEART and other calculatorsREFRESH Note before signing :1}                              Medical Decision Making Amount and/or Complexity of Data Reviewed Labs: ordered. Radiology: ordered.  Risk Prescription drug management.   Patient with COPD and healing pneumonia.  She is sent home on prednisone  and will continue the Zithromax  and is given an albuterol  inhaler.  She will follow-up with PCP if not improving  {Document critical care time when appropriate:1} {Document review of labs and clinical decision tools ie heart score, Chads2Vasc2 etc:1}  {Document your independent review of radiology images, and any outside records:1} {Document your discussion with family members, caretakers, and with consultants:1} {Document social determinants of health affecting pt's care:1} {Document your decision making why or why not admission, treatments were needed:1} Final Clinical Impression(s) / ED Diagnoses Final diagnoses:  COPD exacerbation (HCC)    Rx / DC Orders ED Discharge Orders          Ordered    predniSONE  (DELTASONE ) 10 MG tablet  Daily        10/19/23 1445

## 2023-10-22 DIAGNOSIS — E1142 Type 2 diabetes mellitus with diabetic polyneuropathy: Secondary | ICD-10-CM | POA: Diagnosis not present

## 2023-10-31 DIAGNOSIS — G4733 Obstructive sleep apnea (adult) (pediatric): Secondary | ICD-10-CM | POA: Diagnosis not present

## 2023-11-04 ENCOUNTER — Ambulatory Visit: Admitting: Adult Health

## 2023-11-06 ENCOUNTER — Other Ambulatory Visit: Payer: Self-pay | Admitting: Neurology

## 2023-11-06 DIAGNOSIS — M5412 Radiculopathy, cervical region: Secondary | ICD-10-CM | POA: Diagnosis not present

## 2023-11-06 DIAGNOSIS — M25551 Pain in right hip: Secondary | ICD-10-CM | POA: Diagnosis not present

## 2023-11-06 DIAGNOSIS — M5030 Other cervical disc degeneration, unspecified cervical region: Secondary | ICD-10-CM | POA: Diagnosis not present

## 2023-11-06 DIAGNOSIS — M51361 Other intervertebral disc degeneration, lumbar region with lower extremity pain only: Secondary | ICD-10-CM | POA: Diagnosis not present

## 2023-11-10 ENCOUNTER — Other Ambulatory Visit: Payer: Self-pay | Admitting: Family Medicine

## 2023-11-12 ENCOUNTER — Other Ambulatory Visit: Payer: Self-pay | Admitting: *Deleted

## 2023-11-12 MED ORDER — VALACYCLOVIR HCL 1 G PO TABS: 1000.0000 mg | ORAL_TABLET | Freq: Every day | ORAL | 12 refills | Status: DC

## 2023-11-12 NOTE — Progress Notes (Signed)
 Refilled valtrex

## 2023-11-21 DIAGNOSIS — E1142 Type 2 diabetes mellitus with diabetic polyneuropathy: Secondary | ICD-10-CM | POA: Diagnosis not present

## 2023-11-25 ENCOUNTER — Ambulatory Visit: Payer: Self-pay

## 2023-11-25 NOTE — Telephone Encounter (Signed)
 FYI Only or Action Required?: FYI only for provider  Patient was last seen in primary care on 09/09/2023 by Galvin Jules, FNP. Called Nurse Triage reporting Dizziness. Symptoms began today. Interventions attempted: Nothing. Symptoms are: gradually improving.  Triage Disposition: See PCP When Office is Open (Within 3 Days)  Patient/caregiver understands and will follow disposition?:Yes    Copied from CRM [id number]. Topic: Clinical - Red Word Triage >> Nov 25, 2023  4:17 PM Elle L wrote: Red Word that prompted transfer to Nurse Triage: The patient had a vertigo attack and was experiencing dizziness while she was driving. Reason for Disposition  [1] MODERATE dizziness (e.g., vertigo; feels very unsteady, interferes with normal activities) AND [2] has been evaluated by doctor (or NP/PA) for this  Answer Assessment - Initial Assessment Questions 1. DESCRIPTION: Describe your dizziness.     Just got dizzy while driving 2. VERTIGO: Do you feel like either you or the room is spinning or tilting?      no 3. LIGHTHEADED: Do you feel lightheaded? (e.g., somewhat faint, woozy, weak upon standing)     Just dizzy 4. SEVERITY: How bad is it?  Can you walk?   - MILD: Feels slightly dizzy and unsteady, but is walking normally.   - MODERATE: Feels unsteady when walking, but not falling; interferes with normal activities (e.g., school, work).   - SEVERE: Unable to walk without falling, or requires assistance to walk without falling.     mild 5. ONSET:  When did the dizziness begin?     today 6. AGGRAVATING FACTORS: Does anything make it worse? (e.g., standing, change in head position)     ch 7. CAUSE: What do you think is causing the dizziness?     Unknown- took a cruise in May and bother left ear 8. RECURRENT SYMPTOM: Have you had dizziness before? If Yes, ask: When was the last time? What happened that time?     A while ago 9. OTHER SYMPTOMS: Do you have any other symptoms?  (e.g., headache, weakness, numbness, vomiting, earache)    fullness in left ear  Protocols used: Dizziness - Vertigo-A-AH

## 2023-11-27 ENCOUNTER — Ambulatory Visit: Admitting: Family Medicine

## 2023-12-01 DIAGNOSIS — G4733 Obstructive sleep apnea (adult) (pediatric): Secondary | ICD-10-CM | POA: Diagnosis not present

## 2023-12-02 ENCOUNTER — Encounter: Payer: Self-pay | Admitting: Family Medicine

## 2023-12-03 ENCOUNTER — Encounter: Payer: Self-pay | Admitting: Family Medicine

## 2023-12-03 ENCOUNTER — Telehealth: Payer: Self-pay | Admitting: Family Medicine

## 2023-12-03 ENCOUNTER — Ambulatory Visit (INDEPENDENT_AMBULATORY_CARE_PROVIDER_SITE_OTHER): Admitting: Family Medicine

## 2023-12-03 VITALS — Temp 97.6°F | Ht 69.0 in | Wt 172.4 lb

## 2023-12-03 DIAGNOSIS — H6593 Unspecified nonsuppurative otitis media, bilateral: Secondary | ICD-10-CM | POA: Diagnosis not present

## 2023-12-03 DIAGNOSIS — R42 Dizziness and giddiness: Secondary | ICD-10-CM

## 2023-12-03 DIAGNOSIS — R000 Tachycardia, unspecified: Secondary | ICD-10-CM | POA: Diagnosis not present

## 2023-12-03 MED ORDER — MECLIZINE HCL 25 MG PO TABS
50.0000 mg | ORAL_TABLET | Freq: Every day | ORAL | 2 refills | Status: AC | PRN
Start: 2023-12-03 — End: ?

## 2023-12-03 MED ORDER — FLUTICASONE PROPIONATE 50 MCG/ACT NA SUSP: 2.0000 | Freq: Every day | NASAL | 0 refills | Status: AC

## 2023-12-03 NOTE — Telephone Encounter (Signed)
 Needs appt to see Cynthia Deleon for diabetes. Please call.

## 2023-12-03 NOTE — Patient Instructions (Signed)
 Vertigo Vertigo is the feeling that you or the things around you are moving or spinning when they're not. It's different than feeling dizzy. It can also cause: Loss of balance. Trouble standing or walking. Nausea and vomiting. This feeling can come and go at any time. It can last from a few seconds to minutes or even hours. It may go away on its own or be treated with medicine. What are the types of vertigo? There are two types of vertigo: Peripheral vertigo happens when parts of your inner ear don't work like they should. This is the more common type. Central vertigo happens when your brain and spinal cord don't work like they should. Your health care provider will do tests to find out what kind of vertigo you have. This will help them decide on the right treatment for you. Follow these instructions at home: Eating and drinking Drink enough fluid to keep your pee (urine) pale yellow. Do not drink alcohol. Activity When you get up in the morning, first sit up on the side of the bed. When you feel okay, stand slowly while holding onto something. Move slowly. Avoid sudden body or head movements. Avoid certain positions, as told by your provider. Use a cane if you have trouble standing or walking. Sit down right away if you feel unsteady. Place items in your home so they're easy for you to reach without bending or leaning over. Return to normal activities when you're told. Ask what things are safe for you to do. General instructions Take your medicines only as told by your provider. Contact a health care provider if: Your medicines don't help or make your vertigo worse. You get new symptoms. You have a fever. You have nausea or vomiting. Your family or friends spot any changes in how you're acting. A part of your body goes numb. You feel tingling and prickling in a part of your body. You get very bad headaches. Get help right away if: You're always dizzy or you faint. You have a  stiff neck. You have trouble moving or speaking. Your hands, arms, or legs feel weak. Your hearing or eyesight changes. These symptoms may be an emergency. Call 911 right away. Do not wait to see if the symptoms will go away. Do not drive yourself to the hospital. This information is not intended to replace advice given to you by your health care provider. Make sure you discuss any questions you have with your health care provider. Document Revised: 02/27/2023 Document Reviewed: 08/30/2022 Elsevier Patient Education  2024 [employer].

## 2023-12-03 NOTE — Progress Notes (Signed)
 Acute Office Visit  Subjective:     [name], female    DOB: 11-10-1955, 68 y.o.   MRN: [id number]  Chief Complaint  Patient presents with   Dizziness    HPI Patient is in today for intermittent dizziness. She has had 2-3 episodes of this for the last few months. Started after a cruise to the [place]. She reports blurred vision, weakness all over, head spinning. Most recent episode was last week. Last at least an hour. Denies chest pain, shortness of breath, palpitations, numbness, tingling. Moving aggravating symptoms. She has a history of vertigo with the same presentation. She also has had congestion in both ears since the cruise. Unchanged. Pain with left ear. Worse with blowing her nose. Mild congestion and runny nose that has been intermittent. Denies changes in her hearing. Taking zyrtec  and singular. She has not been taking flonase . PCP has talked about referral to neuro vestibular rehab. She is interested in this.   ROS As per HPI.      Objective:      12/03/2023    3:38 PM 3:47 PM 3:48 PM    Orthostatic BP 109/64 113/70 115/73  Patient Position Supine Sitting Standing  Orthostatic Pulse 101 98 101  Temp -- -- 97.6 F (36.4 C)  Temp Source -- -- Temporal  Weight 172 lb 6.4 oz (78.2 kg) -- --  Height 5' 9 (1.753 m) -- --  SpO2 -- -- 91 %    Ht 5' 9 (1.753 m)   Wt 172 lb 6.4 oz (78.2 kg)   BMI 25.46 kg/m  SpO2 Readings from Last 3 Encounters:  10/19/23 93%  09/09/23 96%  08/12/23 92%   Pulse Readings from Last 3 Encounters:  10/19/23 93  09/09/23 (!) 105  08/12/23 100     Physical Exam Vitals and nursing note reviewed.  Constitutional:      General: She is not in acute distress.    Appearance: Normal appearance. She is not ill-appearing, toxic-appearing or diaphoretic.  HENT:     Right Ear: Ear canal and external ear normal. A middle ear effusion is present. Tympanic membrane is not perforated, erythematous or bulging.      Left Ear: Ear canal and external ear normal. A middle ear effusion is present. Tympanic membrane is not perforated, erythematous or bulging.     Nose: Nose normal.     Mouth/Throat:     Mouth: Mucous membranes are moist.     Pharynx: Oropharynx is clear.   Eyes:     Extraocular Movements: Extraocular movements intact.     Right eye: Normal extraocular motion and no nystagmus.     Left eye: Normal extraocular motion and no nystagmus.     Conjunctiva/sclera: Conjunctivae normal.     Pupils: Pupils are equal, round, and reactive to light.    Cardiovascular:     Rate and Rhythm: Normal rate and regular rhythm.     Pulses: Normal pulses.     Heart sounds: Normal heart sounds. No murmur heard. Pulmonary:     Effort: Pulmonary effort is normal. No respiratory distress.     Breath sounds: Normal breath sounds.  Abdominal:     General: Bowel sounds are normal. There is no distension.     Palpations: Abdomen is soft. There is no mass.     Tenderness: There is no abdominal tenderness. There is no guarding or rebound.   Musculoskeletal:     Cervical back: Neck supple. No tenderness.  Right lower leg: No edema.     Left lower leg: No edema.  Lymphadenopathy:     Cervical: No cervical adenopathy.   Skin:    General: Skin is warm and dry.   Neurological:     General: No focal deficit present.     Mental Status: She is alert and oriented to person, place, and time.     Cranial Nerves: No cranial nerve deficit.     Sensory: No sensory deficit.     Motor: No weakness.     Coordination: Coordination normal.     Gait: Gait normal.   Psychiatric:        Mood and Affect: Mood normal.        Behavior: Behavior normal.     No results found for any visits on 12/03/23.      Assessment & Plan:   Silva was seen today for dizziness.  Diagnoses and all orders for this visit:  Dizziness Reviewed OV note 08/12/23. CBC and CMP from 10/19/23. Benign neuro exam today. Negative  orthostatics. Consist with prior vertigo episodes that have been ongoing. Referral to neurovestibular rehab discussed and placed. Meclizine  prn. Discussed hydration.   Vertigo -     Ambulatory referral to Physical Therapy -     meclizine  (ANTIVERT ) 25 MG tablet; Take 2 tablets (50 mg total) by mouth daily as needed for dizziness.  Fluid level behind tympanic membrane of both ears Flonase  as below. No signs of infection.  -     fluticasone  (FLONASE ) 50 MCG/ACT nasal spray; Place 2 sprays into both nostrils daily.  Tachycardia Normal RRR on exam. She had caffeine just prior to visit. Denies symptoms.    Keep follow up with PCP on 12/18/23. Sooner for new or worsening symptoms.   The patient indicates understanding of these issues and agrees with the plan.  [name] Search, FNP

## 2023-12-04 ENCOUNTER — Telehealth: Payer: Self-pay

## 2023-12-04 NOTE — Progress Notes (Signed)
 Care Guide Pharmacy Note  12/04/2023 Name: Cynthia Deleon MRN: [id number] DOB: 09/23/1955  Referred By: Severa Rock HERO, FNP Reason for referral: Complex Care Management (Outreach to schedule with Pharm d )   Cynthia Deleon is a 68 y.o. year old female who is a primary care patient of Rakes, Rock HERO, FNP.  Cynthia Deleon was referred to the pharmacist for assistance related to: DMII  An unsuccessful telephone outreach was attempted today to contact the patient who was referred to the pharmacy team for assistance with medication management. Additional attempts will be made to contact the patient.  Jeoffrey Buffalo , RMA     [facility]  [facility], [facility] Guide  Direct Dial : [phone]  Website: [email or web address]

## 2023-12-08 ENCOUNTER — Other Ambulatory Visit: Payer: Self-pay

## 2023-12-08 ENCOUNTER — Other Ambulatory Visit: Payer: Self-pay | Admitting: Neurology

## 2023-12-08 DIAGNOSIS — E11649 Type 2 diabetes mellitus with hypoglycemia without coma: Secondary | ICD-10-CM

## 2023-12-08 MED ORDER — ATORVASTATIN CALCIUM 80 MG PO TABS
80.0000 mg | ORAL_TABLET | Freq: Every day | ORAL | 0 refills | Status: DC
Start: 2023-12-08 — End: 2024-01-22

## 2023-12-08 NOTE — Telephone Encounter (Signed)
 Robin from  [employer] -  Is asking that re: the DULoxetine  (CYMBALTA ) 60 MG capsule someone calls to give a verbal for this to be filled. This medication has to be bubble wrapped and they are trying to get to out to pt because after today pt will be out, please call Robin at [phone], message sent as high priority per request of Grayce

## 2023-12-12 ENCOUNTER — Other Ambulatory Visit: Payer: Self-pay | Admitting: Family Medicine

## 2023-12-17 ENCOUNTER — Telehealth: Payer: Self-pay | Admitting: Family Medicine

## 2023-12-17 NOTE — Telephone Encounter (Signed)
 Copied from CRM [id number]. Topic: Appointments - Scheduling Inquiry for Clinic >> Dec 16, 2023  4:53 PM Adrianna P wrote: Reason for CRM: patient needs an appt with julie, please call her at [phone]   ----------------------------------------------------------------------- From previous Reason for Contact - Scheduling: Patient/patient representative is calling to schedule an appointment. Refer to attachments for appointment information.

## 2023-12-18 ENCOUNTER — Ambulatory Visit: Payer: Self-pay | Admitting: Family Medicine

## 2023-12-18 ENCOUNTER — Telehealth: Payer: Self-pay | Admitting: Family Medicine

## 2023-12-18 ENCOUNTER — Encounter: Payer: Self-pay | Admitting: Family Medicine

## 2023-12-18 ENCOUNTER — Ambulatory Visit: Admitting: Family Medicine

## 2023-12-18 VITALS — BP 118/58 | HR 88 | Temp 96.9°F | Ht 69.0 in | Wt 173.8 lb

## 2023-12-18 DIAGNOSIS — E785 Hyperlipidemia, unspecified: Secondary | ICD-10-CM | POA: Diagnosis not present

## 2023-12-18 DIAGNOSIS — E1142 Type 2 diabetes mellitus with diabetic polyneuropathy: Secondary | ICD-10-CM | POA: Diagnosis not present

## 2023-12-18 DIAGNOSIS — Z23 Encounter for immunization: Secondary | ICD-10-CM

## 2023-12-18 DIAGNOSIS — Z794 Long term (current) use of insulin: Secondary | ICD-10-CM

## 2023-12-18 DIAGNOSIS — J449 Chronic obstructive pulmonary disease, unspecified: Secondary | ICD-10-CM | POA: Diagnosis not present

## 2023-12-18 DIAGNOSIS — E1169 Type 2 diabetes mellitus with other specified complication: Secondary | ICD-10-CM | POA: Diagnosis not present

## 2023-12-18 DIAGNOSIS — H6993 Unspecified Eustachian tube disorder, bilateral: Secondary | ICD-10-CM

## 2023-12-18 LAB — BAYER DCA HB A1C WAIVED: HB A1C (BAYER DCA - WAIVED): 6.9 % — ABNORMAL HIGH (ref 4.8–5.6)

## 2023-12-18 NOTE — Patient Instructions (Addendum)

## 2023-12-18 NOTE — Progress Notes (Addendum)
 Subjective:  [name], female    DOB: December 06, 1955, 68 y.o.   MRN: [id number]  Patient Care Team: Severa Rock HERO, FNP as PCP - General (Family Medicine) Bonner Ade, MD as Consulting Physician (Physical Medicine and Rehabilitation) Tasia Lung, MD as Consulting Physician (Psychiatry) Milton Lade, MD as Consulting Physician (Neurology) Billee Mliss BIRCH, [facility] (Pharmacist) Porter Andrez SAUNDERS, PA-C as Physician Assistant (Dermatology) Darroll Anes, DO (Optometry) Hope Almarie ORN, NP as Nurse Practitioner (Pulmonary Disease)   Chief Complaint:  Medical Management of Chronic Issues   HPI: Cynthia Deleon is a 68 y.o. female presenting on 12/18/2023 for Medical Management of Chronic Issues    The patient presents for management of chronic medical conditions. She also complains of intermittent dizziness and ear popping.  She has been experiencing ongoing dizziness and ear popping since returning from a cruise. Despite previous evaluations, these symptoms have persisted. Her nose and ears continue to pop, and she had expected them to be flushed out during a previous visit.  She has a history of COPD exacerbation, which required ED visit in May. Since then, her breathing has improved.  She manages her diabetes with a weekly injection of Mounjaro  at a dose of 12.5 mg. She no longer takes oral medications for diabetes. She reports no side effects from Mounjaro  but experiences increased thirst. Her blood sugar levels are generally good, though they sometimes reach 250 mg/dL, particularly after eating cherries.  She experiences weakness in her knees and has been using a Marlo for exercise, which she finds beneficial despite it causing back pain.  No significant increase in hunger, urination, leg swelling, headaches, chest pain, confusion, or weakness on one side. Reports increased thirst and dizziness.          Relevant past medical, surgical, family, and  social history reviewed and updated as indicated.  Allergies and medications reviewed and updated. Data reviewed: Chart in Epic.   Past Medical History:  Diagnosis Date   Aortic atherosclerosis (HCC) 10/31/2021   Arthritis    hands and knees   Bipolar 1 disorder (HCC)    Borderline diabetic    Chronic back pain    Chronic neck pain    Complication of anesthesia    high anxiety-does not want to be alone   COPD (chronic obstructive pulmonary disease) (HCC)    Current use of estrogen therapy 01/12/2014   Depression    Diabetes mellitus without complication (HCC)    Diplopia    Family history of adverse reaction to anesthesia    sister gas in lungs   GERD (gastroesophageal reflux disease)    Headache    Hepatic steatosis 10/31/2021   Hot flashes 12/15/2013   Hypertension    IBS (irritable bowel syndrome)    Incontinence    Kidney stone    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HCC)    Osteopenia 02/06/2022   Panic attacks    Peptic ulcer    Peripheral neuropathy    Rosacea    Shingles    Sleep apnea    uses a cpap-with oxygen     Past Surgical History:  Procedure Laterality Date   BIOPSY  04/27/2020   Procedure: BIOPSY;  Surgeon: Donnald Charleston, MD;  Location: [facility];  Service: Endoscopy;;   BUNIONECTOMY WITH HAMMERTOE RECONSTRUCTION Right 12/10/2012   Procedure: RIGHT FIRST METATARSAL CHEVRON BUNION CORRECTION,  2 AND 3 HAMMERTOE CORRECTION , RIGHT 3 AND 4 TOE NAIL EXCISION ;  Surgeon: Norleen Armor, MD;  Location: [facility];  Service: Orthopedics;  Laterality: Right;   COLONOSCOPY WITH PROPOFOL  N/A 04/27/2020   Procedure: COLONOSCOPY WITH PROPOFOL ;  Surgeon: Donnald Charleston, MD;  Location: [facility];  Service: Endoscopy;  Laterality: N/A;   COLONOSCOPY WITH PROPOFOL  N/A 06/17/2023   Procedure: COLONOSCOPY WITH PROPOFOL ;  Surgeon: Eartha Angelia Sieving, MD;  Location: [facility];  Service: Gastroenterology;  Laterality: N/A;  2:30PM;ASA 3   FOOT  ARTHRODESIS  2000   both feet   LIPOSUCTION  03/2018   POLYPECTOMY  06/17/2023   Procedure: POLYPECTOMY;  Surgeon: Eartha Angelia Sieving, MD;  Location: [facility];  Service: Gastroenterology;;   RECTAL SURGERY     TONSILLECTOMY     TOTAL ABDOMINAL HYSTERECTOMY      Social History   Socioeconomic History   Marital status: Married    Spouse name: Not on file   Number of children: 5   Years of education: 9th   Highest education level: Not on file  Occupational History   Occupation: Disabled  Tobacco Use   Smoking status: Former    Current packs/day: 0.25    Average packs/day: 1 pack/day for 43.5 years (42.4 ttl pk-yrs)    Types: Cigarettes    Start date: 06/11/1975    Quit date: 06/10/2017   Smokeless tobacco: Never  Vaping Use   Vaping status: Never Used  Substance and Sexual Activity   Alcohol  use: No    Alcohol /week: 0.0 standard drinks of alcohol    Drug use: No   Sexual activity: Not Currently    Birth control/protection: Surgical    Comment: hyst  Other Topics Concern   Not on file  Social History Narrative   Lives at home with home with her son (has five children).   Right-handed.   1 cup caffeine per day.   Social Drivers of Corporate investment banker Strain: Low Risk  (08/21/2022)   Overall Financial Resource Strain (CARDIA)    Difficulty of Paying Living Expenses: Not hard at all  Food Insecurity: No Food Insecurity (08/21/2022)   Hunger Vital Sign    Worried About Running Out of Food in the Last Year: Never true    Ran Out of Food in the Last Year: Never true  Transportation Needs: No Transportation Needs (08/21/2022)   PRAPARE - Administrator, Civil Service (Medical): No    Lack of Transportation (Non-Medical): No  Physical Activity: Inactive (08/21/2022)   Exercise Vital Sign    Days of Exercise per Week: 0 days    Minutes of Exercise per Session: 0 min  Stress: No Stress Concern Present (08/21/2022)   [employer] of Occupational  Health - Occupational Stress Questionnaire    Feeling of Stress : Not at all  Social Connections: Moderately Integrated (08/21/2022)   Social Connection and Isolation Panel    Frequency of Communication with Friends and Family: More than three times a week    Frequency of Social Gatherings with Friends and Family: More than three times a week    Attends Religious Services: More than 4 times per year    Active Member of [employer] or Organizations: Yes    Attends Banker Meetings: More than 4 times per year    Marital Status: Divorced  Intimate Partner Violence: Not At Risk (08/21/2022)   Humiliation, Afraid, Rape, and Kick questionnaire    Fear of Current or Ex-Partner: No    Emotionally Abused: No    Physically Abused: No  Sexually Abused: No    Outpatient Encounter Medications as of 12/18/2023  Medication Sig   albuterol  (VENTOLIN  HFA) 108 (90 Base) MCG/ACT inhaler Inhale 1-2 puffs into the lungs every 6 (six) hours as needed for wheezing or shortness of breath.   ARIPiprazole  (ABILIFY ) 10 MG tablet Take 10 mg by mouth every morning.   Artificial Saliva (BIOTENE DRY MOUTH MOISTURIZING) SOLN Use as directed 5 mLs in the mouth or throat in the morning and at bedtime.   aspirin EC 81 MG tablet Take 81 mg by mouth daily. Swallow whole.   atorvastatin  (LIPITOR) 80 MG tablet Take 1 tablet (80 mg total) by mouth daily.   cetirizine  (ZYRTEC ) 10 MG tablet Take 1 tablet (10 mg total) by mouth daily.   cholecalciferol  (VITAMIN D ) 25 MCG tablet Take 1 tablet (1,000 Units total) by mouth daily.   Continuous Glucose Receiver (FREESTYLE LIBRE 3 READER) DEVI with compatible Freestyle Libre 3 sensor to monitor glucose continuously. DX: E11.65   Continuous Glucose Sensor (FREESTYLE LIBRE 3 PLUS SENSOR) MISC Change sensor every 15 days. DX: E11.65   diazepam  (VALIUM ) 5 MG tablet Take 5 mg by mouth 2 (two) times daily.   DULoxetine  (CYMBALTA ) 60 MG capsule TAKE (1) CAPSULE BY MOUTH EVERY DAY.  (Patient taking differently: Not taking)   estrogens , conjugated, (PREMARIN ) 0.3 MG tablet TAKE (1) TABLET BY MOUTH ONCE DAILY.   ezetimibe  (ZETIA ) 10 MG tablet TAKE (1) TABLET BY MOUTH ONCE DAILY.   fluorometholone (FML) 0.1 % ophthalmic suspension SMARTSIG:In Eye(s)   fluticasone  (FLONASE ) 50 MCG/ACT nasal spray Place 2 sprays into both nostrils daily.   hyoscyamine  (LEVSIN  SL) 0.125 MG SL tablet Place 1 tablet (0.125 mg total) under the tongue every 8 (eight) hours as needed (abdominal pain).   insulin  lispro (HUMALOG  KWIKPEN) 100 UNIT/ML KwikPen Use 5-10 units 2 times daily with breakfast and dinner when blood sugar is >200 on steroids   Insulin  Pen Needle (PIP PEN NEEDLES 32G X ) 32G X 4 MM MISC Use to inject insulin  2 times daily as needed. DX E11.65   lamoTRIgine  (LAMICTAL ) 200 MG tablet Take 200 mg by mouth at bedtime. For mood disorder.   lansoprazole  (PREVACID ) 30 MG capsule TAKE (1) CAPSULE BY MOUTH ONCE DAILY.   lisinopril  (ZESTRIL ) 5 MG tablet Take 1 tablet (5 mg total) by mouth daily.   meclizine  (ANTIVERT ) 25 MG tablet Take 2 tablets (50 mg total) by mouth daily as needed for dizziness.   montelukast  (SINGULAIR ) 10 MG tablet TAKE (1) TABLET BY MOUTH AT BEDTIME.   MOUNJARO  12.5 MG/0.5ML Pen Inject 12.5 mg into the skin once a week.   MYRBETRIQ  50 MG TB24 tablet Take 1 tablet (50 mg total) by mouth daily.   omeprazole  (PRILOSEC) 40 MG capsule Take 1 capsule (40 mg total) by mouth daily.   OneTouch Delica Lancets 30G MISC Use to test blood sugar twice daily. DX: E11.9   oxyCODONE  (ROXICODONE ) 15 MG immediate release tablet Take 1 tablet (15 mg total) by mouth every 6 (six) hours as needed for pain.   pregabalin  (LYRICA ) 100 MG capsule Take 2 capsules by mouth 3 (three) times daily.   promethazine  (PHENERGAN ) 12.5 MG tablet Take 1 tablet (12.5 mg total) by mouth every 8 (eight) hours as needed for nausea or vomiting.   triamcinolone  cream (KENALOG ) 0.1 % Apply 1 Application  topically 2 (two) times daily.   umeclidinium-vilanterol (ANORO ELLIPTA ) 62.5-25 MCG/ACT AEPB Inhale 1 puff into the lungs daily.   valACYclovir  (VALTREX )  1000 MG tablet Take 1 tablet (1,000 mg total) by mouth daily.   vitamin B-12 1000 MCG tablet Take 1 tablet (1,000 mcg total) by mouth daily.   VRAYLAR 3 MG capsule Take 3 mg by mouth at bedtime.   [DISCONTINUED] predniSONE  (DELTASONE ) 10 MG tablet Take 2 tablets (20 mg total) by mouth daily.   No facility-administered encounter medications on file as of 12/18/2023.    Allergies  Allergen Reactions   Divalproex Sodium Other (See Comments)    Hallucinations   Other reaction(s): Confusion  Hallucinations     Other reaction(s): Confusion    Hallucinations   Other Anaphylaxis, Other (See Comments), Nausea And Vomiting and Nausea Only   Pseudoeph-Hydrocodone -Gg Nausea Only   Sulfa Antibiotics Anaphylaxis, Nausea Only, Other (See Comments) and Swelling    Swelling of tongue.   Valproic Acid Other (See Comments)   Varenicline Other (See Comments)    Altered mental status-Per patient was put on allergy list by Dr Todd Cliche bu patient staes she is not allergic to this and is currently taking it.   Varenicline Tartrate Other (See Comments)    Other reaction(s): Confusion   Codeine Nausea And Vomiting and Other (See Comments)    Other reaction(s): vomiting   Hydrocodone  Nausea And Vomiting and Other (See Comments)   Acetaminophen  Nausea Only   Ambien  [Zolpidem ] Other (See Comments)    Causes sleep walking   Clavulanic Acid Diarrhea   Elemental Sulfur Other (See Comments)    Swelling of tongue   Hydrocod Poli-Chlorphe Poli Er Other (See Comments)    Other reaction(s): Skin itches    Macrobid  [Nitrofurantoin ] Nausea And Vomiting    Dizziness, nausea, vomiting   Paxil [Paroxetine Hcl] Other (See Comments)    Causes ringing in the ears.    Prednisone  Other (See Comments)    Other reaction(s): Unknown    Sulfur Other (See Comments)     sulfur   Amoxicillin  Rash    Has patient had a PCN reaction causing immediate rash, facial/tongue/throat swelling, SOB or lightheadedness with hypotension: No Has patient had a PCN reaction causing severe rash involving mucus membranes or skin necrosis: No Has patient had a PCN reaction that required hospitalization: No Has patient had a PCN reaction occurring within the last 10 years: Yes If all of the above answers are NO, then may proceed with Cephalosporin use.    Farxiga  [Dapagliflozin ]     RECURRENT YEAST/UTI   Metformin  And Related Diarrhea   Tape Rash    Pertinent ROS per HPI, otherwise unremarkable      Objective:  BP (!) 118/58   Pulse 88   Temp (!) 96.9 F (36.1 C) (Temporal)   Ht 5' 9 (1.753 m)   Wt 173 lb 12.8 oz (78.8 kg)   SpO2 92%   BMI 25.67 kg/m    Wt Readings from Last 3 Encounters:  12/18/23 173 lb 12.8 oz (78.8 kg)  12/03/23 172 lb 6.4 oz (78.2 kg)  10/19/23 170 lb (77.1 kg)    Physical Exam Vitals and nursing note reviewed.  Constitutional:      General: She is not in acute distress.    Appearance: Normal appearance. She is well-developed and well-groomed. She is not ill-appearing, toxic-appearing or diaphoretic.  HENT:     Head: Normocephalic and atraumatic.     Jaw: There is normal jaw occlusion.     Right Ear: Hearing normal. A middle ear effusion is present. Tympanic membrane is not erythematous or bulging.  Left Ear: Hearing normal. A middle ear effusion is present. Tympanic membrane is not erythematous or bulging.     Nose: Nose normal.     Mouth/Throat:     Lips: Pink.     Mouth: Mucous membranes are moist.     Pharynx: Oropharynx is clear. Uvula midline.  Eyes:     General: Lids are normal.     Extraocular Movements: Extraocular movements intact.     Conjunctiva/sclera: Conjunctivae normal.     Pupils: Pupils are equal, round, and reactive to light.  Neck:     Thyroid : No thyroid  mass, thyromegaly or thyroid  tenderness.      Vascular: No carotid bruit or JVD.     Trachea: Trachea and phonation normal.  Cardiovascular:     Rate and Rhythm: Normal rate and regular rhythm.     Chest Wall: PMI is not displaced.     Pulses: Normal pulses.     Heart sounds: Normal heart sounds. No murmur heard.    No friction rub. No gallop.  Pulmonary:     Effort: Pulmonary effort is normal. No respiratory distress.     Breath sounds: Normal breath sounds. No wheezing.  Abdominal:     General: Bowel sounds are normal. There is no distension or abdominal bruit.     Palpations: Abdomen is soft. There is no hepatomegaly or splenomegaly.     Tenderness: There is no abdominal tenderness. There is no right CVA tenderness or left CVA tenderness.     Hernia: No hernia is present.  Musculoskeletal:        General: Normal range of motion.     Cervical back: Normal range of motion and neck supple.     Right lower leg: No edema.     Left lower leg: No edema.  Lymphadenopathy:     Cervical: No cervical adenopathy.  Skin:    General: Skin is warm and dry.     Capillary Refill: Capillary refill takes less than 2 seconds.     Coloration: Skin is not cyanotic, jaundiced or pale.     Findings: No rash.  Neurological:     General: No focal deficit present.     Mental Status: She is alert and oriented to person, place, and time.     Sensory: Sensation is intact.     Motor: Motor function is intact.     Coordination: Coordination is intact.     Gait: Gait is intact.     Deep Tendon Reflexes: Reflexes are normal and symmetric.  Psychiatric:        Attention and Perception: Attention and perception normal.        Mood and Affect: Mood and affect normal.        Speech: Speech normal.        Behavior: Behavior normal. Behavior is cooperative.        Thought Content: Thought content normal.        Cognition and Memory: Cognition and memory normal.        Judgment: Judgment normal.      Results for orders placed or performed in  visit on 12/18/23  Bayer DCA Hb A1c Waived   Collection Time: 12/18/23  3:48 PM  Result Value Ref Range   HB A1C (BAYER DCA - WAIVED) 6.9 (H) 4.8 - 5.6 %       Pertinent labs & imaging results that were available during my care of the patient were reviewed by me and considered in my  medical decision making.  Assessment & Plan:  Videl was seen today for medical management of chronic issues.  Diagnoses and all orders for this visit:  Type 2 diabetes mellitus with other specified complication, with long-term current use of insulin  (HCC) -     Bayer DCA Hb A1c Waived -     CBC with Differential/Platelet  Hyperlipidemia associated with type 2 diabetes mellitus (HCC) -     Bayer DCA Hb A1c Waived -     CBC with Differential/Platelet  Polyneuropathy due to type 2 diabetes mellitus (HCC) -     Bayer DCA Hb A1c Waived -     CBC with Differential/Platelet  COPD without exacerbation (HCC) -     CBC with Differential/Platelet  Eustachian tube dysfunction, bilateral -     Ambulatory referral to ENT  Need for vaccination -     Hepatitis A hepatitis B combined vaccine IM     Eustachian tube dysfunction Persistent ear popping and dizziness likely due to fluid accumulation behind the eardrum and swollen eustachian tubes. Symptoms began after a cruise. She has not been using Flonase  consistently but will start now. - Recommend consistent use of Flonase  nasal spray for two weeks to decrease inflammation and facilitate fluid drainage - Refer to ENT for further evaluation in [place] - Significant dizziness at times. Wheelchair use when dizzy to prevent falls.   Diabetes mellitus Diabetes managed with Mounjaro  (tirzepatide ) 12.5 mg weekly injection. No oral medications currently. Occasional hyperglycemia noted, particularly after consuming cherries. A1c is 6.9, indicating good control. She experiences increased thirst but no significant side effects from Mounjaro . Continuous glucose  monitoring in use. - Continue Mounjaro  12.5 mg weekly - Monitor blood glucose levels with continuous glucose monitor - Reduce cherry intake to manage blood glucose levels  Chronic obstructive pulmonary disease (COPD) COPD exacerbation in May. Currently well-managed with improved breathing.  General Health Maintenance She is up to date with Shingrix vaccination. Last doses of Hepatitis A and B vaccinations are pending. - Administer last doses of Hepatitis A and B vaccinations  Follow-up She has attempted to schedule an appointment with Mliss twice without success. Needs to ensure appointment is made. - Schedule appointment with Julie          Continue all other maintenance medications.  Follow up plan: Return in about 3 months (around 03/19/2024), or if symptoms worsen or fail to improve, for chronic follow up with all labs.   Continue healthy lifestyle choices, including diet (rich in fruits, vegetables, and lean proteins, and low in salt and simple carbohydrates) and exercise (at least 30 minutes of moderate physical activity daily).  Educational handout given for DM  The above assessment and management plan was discussed with the patient. The patient verbalized understanding of and has agreed to the management plan. Patient is aware to call the clinic if they develop any new symptoms or if symptoms persist or worsen. Patient is aware when to return to the clinic for a follow-up visit. Patient educated on when it is appropriate to go to the emergency department.   Rosaline Bruns, FNP-C [facility] [phone]

## 2023-12-19 LAB — CBC WITH DIFFERENTIAL/PLATELET
Basophils Absolute: 0.1 x10E3/uL (ref 0.0–0.2)
Basos: 1 %
EOS (ABSOLUTE): 0.2 x10E3/uL (ref 0.0–0.4)
Eos: 3 %
Hematocrit: 43.4 % (ref 34.0–46.6)
Hemoglobin: 14.3 g/dL (ref 11.1–15.9)
Immature Grans (Abs): 0 x10E3/uL (ref 0.0–0.1)
Immature Granulocytes: 0 %
Lymphocytes Absolute: 2.3 x10E3/uL (ref 0.7–3.1)
Lymphs: 35 %
MCH: 32.1 pg (ref 26.6–33.0)
MCHC: 32.9 g/dL (ref 31.5–35.7)
MCV: 97 fL (ref 79–97)
Monocytes Absolute: 0.4 x10E3/uL (ref 0.1–0.9)
Monocytes: 6 %
Neutrophils Absolute: 3.6 x10E3/uL (ref 1.4–7.0)
Neutrophils: 55 %
Platelets: 206 x10E3/uL (ref 150–450)
RBC: 4.46 x10E6/uL (ref 3.77–5.28)
RDW: 12.7 % (ref 11.7–15.4)
WBC: 6.6 x10E3/uL (ref 3.4–10.8)

## 2023-12-26 ENCOUNTER — Ambulatory Visit: Payer: Self-pay

## 2023-12-26 ENCOUNTER — Ambulatory Visit

## 2023-12-26 ENCOUNTER — Ambulatory Visit (INDEPENDENT_AMBULATORY_CARE_PROVIDER_SITE_OTHER)

## 2023-12-26 VITALS — Ht 69.0 in | Wt 170.0 lb

## 2023-12-26 DIAGNOSIS — Z794 Long term (current) use of insulin: Secondary | ICD-10-CM

## 2023-12-26 DIAGNOSIS — E1159 Type 2 diabetes mellitus with other circulatory complications: Secondary | ICD-10-CM

## 2023-12-26 DIAGNOSIS — H539 Unspecified visual disturbance: Secondary | ICD-10-CM

## 2023-12-26 DIAGNOSIS — Z0000 Encounter for general adult medical examination without abnormal findings: Secondary | ICD-10-CM

## 2023-12-26 DIAGNOSIS — J449 Chronic obstructive pulmonary disease, unspecified: Secondary | ICD-10-CM

## 2023-12-26 DIAGNOSIS — Z5941 Food insecurity: Secondary | ICD-10-CM

## 2023-12-26 DIAGNOSIS — E1142 Type 2 diabetes mellitus with diabetic polyneuropathy: Secondary | ICD-10-CM | POA: Diagnosis not present

## 2023-12-26 DIAGNOSIS — Z79899 Other long term (current) drug therapy: Secondary | ICD-10-CM

## 2023-12-26 DIAGNOSIS — I152 Hypertension secondary to endocrine disorders: Secondary | ICD-10-CM | POA: Diagnosis not present

## 2023-12-26 DIAGNOSIS — E1169 Type 2 diabetes mellitus with other specified complication: Secondary | ICD-10-CM

## 2023-12-26 DIAGNOSIS — G4733 Obstructive sleep apnea (adult) (pediatric): Secondary | ICD-10-CM

## 2023-12-26 DIAGNOSIS — H532 Diplopia: Secondary | ICD-10-CM

## 2023-12-26 DIAGNOSIS — F17210 Nicotine dependence, cigarettes, uncomplicated: Secondary | ICD-10-CM

## 2023-12-26 DIAGNOSIS — Z0001 Encounter for general adult medical examination with abnormal findings: Secondary | ICD-10-CM

## 2023-12-26 DIAGNOSIS — Z780 Asymptomatic menopausal state: Secondary | ICD-10-CM

## 2023-12-26 DIAGNOSIS — E11649 Type 2 diabetes mellitus with hypoglycemia without coma: Secondary | ICD-10-CM | POA: Diagnosis not present

## 2023-12-26 DIAGNOSIS — Z1231 Encounter for screening mammogram for malignant neoplasm of breast: Secondary | ICD-10-CM

## 2023-12-26 DIAGNOSIS — Z7189 Other specified counseling: Secondary | ICD-10-CM

## 2023-12-26 DIAGNOSIS — Z7409 Other reduced mobility: Secondary | ICD-10-CM

## 2023-12-26 DIAGNOSIS — Z9181 History of falling: Secondary | ICD-10-CM

## 2023-12-26 DIAGNOSIS — E119 Type 2 diabetes mellitus without complications: Secondary | ICD-10-CM

## 2023-12-26 NOTE — Patient Instructions (Addendum)
 Cynthia Deleon ,  Thank you for taking time out of your busy schedule to complete your Annual Wellness Visit with me. I enjoyed our conversation and look forward to speaking with you again next year. I, as well as your care team,  appreciate your ongoing commitment to your health goals. Please review the following plan we discussed and let me know if I can assist you in the future.  I enjoyed our conversation and look forward to it again next year. Blessing for the upcoming year!!  -Michelangelo Rindfleisch     TOGETHER, WE CAME UP WITH THE FOLLOWING GAME PLAN! LETS DO THIS!!!  Referrals:  Here is the phone number for you to call after you have your bone density scan to get a new continuous blood glucose monitor. Call the company that makes your monitor.  [employer]: [phone] [employer]: [phone]  Eye Doctor Referral (He is a preferred doctor on your insurance provider) Adine Haddock, MD [address] [place] [address]  Phone: [phone] If they don't call you, call them at the phone number provided above.   ENT: Ear Nose and Throat Doctor/Dr. Rojean office [facility]NT Specialist [facility]) [address] [place] [address]  Mammogram and Bone Density Screening:  An order was placed for your yearly mammogram and/or your osteoporosis screening at [facility] Ph [phone] We scheduled your mammogram appointment for the Mobile Mammogram Bus on: September 8 at 9:00 am Ph: [phone] I have sent a message to radiology regarding your dexa scan and she will call you to schedule.   Lung Cancer Screening-[place] Office [address]-First Floor Medical Building directly across from [facility] Phone [phone]   Care Management Referral: A referral has been placed for you to check and see what additional resources are available to you.   If you haven't heard from anyone within the next 7 business days, please call them and let them know a referral has been  placed for you Phone: [phone]   Follow up Visits Next PCP Visit: For Routine visit 03/24/2024 at 10:05 am with Cynthia Deleon 1 Year F/U Wellness Visit: December 27, 2024 at 3:10 telephone visit  Clinician Recommendations:  Aim for 30 minutes of exercise or brisk walking, 6-8 glasses of water, and 5 servings of fruits and vegetables each day.       This is a list of the screening recommended for you and due dates:  Health Maintenance  Topic Date Due   Screening for Lung Cancer  12/11/2022   Mammogram  07/09/2023   Eye exam for diabetics  12/25/2023   Hepatitis C Screening  08/11/2024*   COVID-19 Vaccine (3 - Moderna risk series) 01/02/2025*   Flu Shot  01/09/2024   DEXA scan (bone density measurement)  02/07/2024   Yearly kidney health urinalysis for diabetes  02/25/2024   Hemoglobin A1C  06/19/2024   Complete foot exam   08/11/2024   Yearly kidney function blood test for diabetes  10/18/2024   Medicare Annual Wellness Visit  12/25/2024   DTaP/Tdap/Td vaccine (6 - Td or Tdap) 11/21/2027   Colon Cancer Screening  06/16/2028   Pneumococcal Vaccine for age over 34  Completed   Hepatitis B Vaccine  Completed   Zoster (Shingles) Vaccine  Completed   HPV Vaccine  Aged Out   Meningitis B Vaccine  Aged Out  *Topic was postponed. The date shown is not the original due date.    Advanced directives: (Provided) Advance directive  discussed with you today. I have provided a copy for you to complete at home and have notarized. Once this is complete, please bring a copy in to our office so we can scan it into your chart.  Advance Care Planning is important because it:  [x]   Makes sure you receive the medical care that is consistent with your values, goals, and preferences  [x]  It provides guidance to your family and loved ones and reduces their decisional burden about whether or not they are making the right decisions based on your wishes.  Follow the link provided in your after visit  summary or read over the paperwork we have mailed to you to help you started getting your Advance Directives in place. If you need assistance in completing these, please reach out to us  so that we can help you!  We will mail you an Advanced Directives packet as we discussed during your visit today. You do not have to have an attorney to complete this paperwork. Read over the packet, discuss it with family, and complete it. Choose who will be making decisions for you in the event you can no longer make them for yourself. Once completed have them notarized, and bring the packet back to the office. We will scan it and make sure it gets into your chart.   If you choose to have a DNR (Do Not Resuscitate Order) you must get this from your primary care doctor because they have to sign it. You can get this in the office during an appointment.   THIS ORDER MUST BE VISIBLE AT ALL TIMES WITHIN YOUR HOME SUCH AS ON A REFRIGERATOR.

## 2023-12-26 NOTE — Telephone Encounter (Signed)
 Spoke with patient and she states she never took the meclizine  since on the bottle it said could cause dizziness.  Advised patient to take medication since it was for vertigo.  Patient voiced understanding.

## 2023-12-26 NOTE — Telephone Encounter (Signed)
 FYI Only or Action Required?: Action required by provider: requesting different medication for vertigo.  Patient was last seen in primary care on 12/18/2023 by Severa Rock HERO, FNP.  Called Nurse Triage reporting Dizziness.  Symptoms began today.  Interventions attempted: Prescription medications: meclizine .  Symptoms are: gradually worsening.  Triage Disposition: See Physician Within 24 Hours  Patient/caregiver understands and will follow disposition?: Yes    Copied from CRM [id number]. Topic: Clinical - Red Word Triage >> Dec 26, 2023 12:12 PM Carlatta H wrote: [facility] that prompted transfer to Nurse Triage: Patient is having a reaction to meclizine  (ANTIVERT ) 25 MG tablet [[id number]]//Patient is dizzy,nauseous and can't see//she is also having a vertigo attack// Reason for Disposition  [1] NO dizziness now AND [2] age > 93  Answer Assessment - Initial Assessment Questions 1. DESCRIPTION: Describe your dizziness.     Took meclizine  and now her side effect is dizziness 2. VERTIGO: Do you feel like either you or the room is spinning or tilting?      yes 3. LIGHTHEADED: Do you feel lightheaded? (e.g., somewhat faint, woozy, weak upon standing)     dizzy 4. SEVERITY: How bad is it?  Can you walk?     Moderate to severe 5. ONSET:  When did the dizziness begin?     today 6. AGGRAVATING FACTORS: Does anything make it worse? (e.g., standing, change in head position)     yes 7. CAUSE: What do you think is causing the dizziness?     meclizine  8. RECURRENT SYMPTOM: Have you had dizziness before? If Yes, ask: When was the last time? What happened that time?     yes 9. OTHER SYMPTOMS: Do you have any other symptoms? (e.g., earache, headache, numbness, tinnitus, vomiting, weakness)     N/a 10. PREGNANCY: Is there any chance you are pregnant? When was your last menstrual period?       Na  Pt on meclizine   for vertigo took some today and now she is  dizzy,nauseous .  Pt would like PCP to prescribed something stronger and something that does not cause dizziness. Pt having to reschedule doctor appt today b/c dizzy  Protocols used: Dizziness - Vertigo-A-AH

## 2023-12-26 NOTE — Progress Notes (Signed)
 Subjective:   Cynthia Deleon is a 68 y.o. who presents for a Medicare Wellness preventive visit.  As a reminder, Annual Wellness Visits don't include a physical exam, and some assessments may be limited, especially if this visit is performed virtually. We may recommend an in-person follow-up visit with your provider if needed.  Visit Complete: Virtual I connected with  Cynthia Deleon on 12/26/23 by a audio enabled telemedicine application and verified that I am speaking with the correct person using two identifiers.  Patient Location: Home  Provider Location: Home Office  I discussed the limitations of evaluation and management by telemedicine. The patient expressed understanding and agreed to proceed.  Vital Signs: Because this visit was a virtual/telehealth visit, some criteria may be missing or patient reported. Any vitals not documented were not able to be obtained and vitals that have been documented are patient reported.  VideoDeclined- This patient declined Librarian, academic. Therefore the visit was completed with audio only.  Persons Participating in Visit: Patient.  AWV Questionnaire: No: Patient Medicare AWV questionnaire was not completed prior to this visit.  Cardiac Risk Factors include: advanced age (>39men, >42 women);diabetes mellitus;dyslipidemia;hypertension;smoking/ tobacco exposure     Objective:    Today's Vitals   12/26/23 0946  Weight: 170 lb (77.1 kg)  Height: 5' 9 (1.753 m)   Body mass index is 25.1 kg/m.     12/26/2023    9:45 AM 10/19/2023   11:05 AM 06/17/2023   12:08 PM 06/10/2023   10:12 AM 04/15/2023   11:05 AM 08/21/2022    1:30 PM 11/21/2021    2:00 PM  Advanced Directives  Does Patient Have a Medical Advance Directive? No No No No No Yes Yes  Type of Careers adviser;Living will Healthcare Power of Attorney  Does patient want to make changes to medical advance directive?        No - Guardian declined  Copy of Healthcare Power of Attorney in Chart?      No - copy requested   Would patient like information on creating a medical advance directive? Yes (MAU/Ambulatory/Procedural Areas - Information given)  No - Patient declined        Current Medications (verified) Outpatient Encounter Medications as of 12/26/2023  Medication Sig   albuterol  (VENTOLIN  HFA) 108 (90 Base) MCG/ACT inhaler Inhale 1-2 puffs into the lungs every 6 (six) hours as needed for wheezing or shortness of breath.   ARIPiprazole  (ABILIFY ) 10 MG tablet Take 10 mg by mouth every morning.   Artificial Saliva (BIOTENE DRY MOUTH MOISTURIZING) SOLN Use as directed 5 mLs in the mouth or throat in the morning and at bedtime.   aspirin EC 81 MG tablet Take 81 mg by mouth daily. Swallow whole.   atorvastatin  (LIPITOR) 80 MG tablet Take 1 tablet (80 mg total) by mouth daily.   cetirizine  (ZYRTEC ) 10 MG tablet Take 1 tablet (10 mg total) by mouth daily.   cholecalciferol  (VITAMIN D ) 25 MCG tablet Take 1 tablet (1,000 Units total) by mouth daily.   Continuous Glucose Receiver (FREESTYLE LIBRE 3 READER) DEVI with compatible Freestyle Libre 3 sensor to monitor glucose continuously. DX: E11.65   Continuous Glucose Sensor (FREESTYLE LIBRE 3 PLUS SENSOR) MISC Change sensor every 15 days. DX: E11.65   diazepam  (VALIUM ) 5 MG tablet Take 5 mg by mouth 2 (two) times daily.   DULoxetine  (CYMBALTA ) 60 MG capsule TAKE (1) CAPSULE BY MOUTH EVERY  DAY. (Patient taking differently: Not taking)   estrogens , conjugated, (PREMARIN ) 0.3 MG tablet TAKE (1) TABLET BY MOUTH ONCE DAILY.   ezetimibe  (ZETIA ) 10 MG tablet TAKE (1) TABLET BY MOUTH ONCE DAILY.   fluorometholone (FML) 0.1 % ophthalmic suspension SMARTSIG:In Eye(s)   fluticasone  (FLONASE ) 50 MCG/ACT nasal spray Place 2 sprays into both nostrils daily.   hyoscyamine  (LEVSIN  SL) 0.125 MG SL tablet Place 1 tablet (0.125 mg total) under the tongue every 8 (eight) hours as  needed (abdominal pain).   insulin  lispro (HUMALOG  KWIKPEN) 100 UNIT/ML KwikPen Use 5-10 units 2 times daily with breakfast and dinner when blood sugar is >200 on steroids   Insulin  Pen Needle (PIP PEN NEEDLES 32G X ) 32G X 4 MM MISC Use to inject insulin  2 times daily as needed. DX E11.65   lamoTRIgine  (LAMICTAL ) 200 MG tablet Take 200 mg by mouth at bedtime. For mood disorder.   lansoprazole  (PREVACID ) 30 MG capsule TAKE (1) CAPSULE BY MOUTH ONCE DAILY.   lisinopril  (ZESTRIL ) 5 MG tablet Take 1 tablet (5 mg total) by mouth daily.   meclizine  (ANTIVERT ) 25 MG tablet Take 2 tablets (50 mg total) by mouth daily as needed for dizziness.   montelukast  (SINGULAIR ) 10 MG tablet TAKE (1) TABLET BY MOUTH AT BEDTIME.   MOUNJARO  12.5 MG/0.5ML Pen Inject 12.5 mg into the skin once a week.   MYRBETRIQ  50 MG TB24 tablet Take 1 tablet (50 mg total) by mouth daily.   omeprazole  (PRILOSEC) 40 MG capsule Take 1 capsule (40 mg total) by mouth daily.   OneTouch Delica Lancets 30G MISC Use to test blood sugar twice daily. DX: E11.9   oxyCODONE  (ROXICODONE ) 15 MG immediate release tablet Take 1 tablet (15 mg total) by mouth every 6 (six) hours as needed for pain.   pregabalin  (LYRICA ) 100 MG capsule Take 2 capsules by mouth 3 (three) times daily.   promethazine  (PHENERGAN ) 12.5 MG tablet Take 1 tablet (12.5 mg total) by mouth every 8 (eight) hours as needed for nausea or vomiting.   triamcinolone  cream (KENALOG ) 0.1 % Apply 1 Application topically 2 (two) times daily.   umeclidinium-vilanterol (ANORO ELLIPTA ) 62.5-25 MCG/ACT AEPB Inhale 1 puff into the lungs daily.   valACYclovir  (VALTREX ) 1000 MG tablet Take 1 tablet (1,000 mg total) by mouth daily.   vitamin B-12 1000 MCG tablet Take 1 tablet (1,000 mcg total) by mouth daily.   VRAYLAR 3 MG capsule Take 3 mg by mouth at bedtime.   No facility-administered encounter medications on file as of 12/26/2023.    Allergies (verified) Divalproex sodium, Other,  Pseudoeph-hydrocodone -gg, Sulfa antibiotics, Valproic acid, Varenicline, Varenicline tartrate, Codeine, Hydrocodone , Acetaminophen , Ambien  [zolpidem ], Clavulanic acid, Elemental sulfur, Hydrocod poli-chlorphe poli er, Macrobid  [nitrofurantoin ], Paxil [paroxetine hcl], Prednisone , Sulfur, Amoxicillin , Farxiga  [dapagliflozin ], Metformin  and related, and Tape   History: Past Medical History:  Diagnosis Date   Aortic atherosclerosis (HCC) 10/31/2021   Arthritis    hands and knees   Bipolar 1 disorder (HCC)    Borderline diabetic    Chronic back pain    Chronic neck pain    Complication of anesthesia    high anxiety-does not want to be alone   COPD (chronic obstructive pulmonary disease) (HCC)    Current use of estrogen therapy 01/12/2014   Depression    Diabetes mellitus without complication (HCC)    Diplopia    Family history of adverse reaction to anesthesia    sister gas in lungs   GERD (gastroesophageal reflux disease)  Headache    Hepatic steatosis 10/31/2021   Hot flashes 12/15/2013   Hypertension    IBS (irritable bowel syndrome)    Incontinence    Kidney stone    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HCC)    Osteopenia 02/06/2022   Panic attacks    Peptic ulcer    Peripheral neuropathy    Rosacea    Shingles    Sleep apnea    uses a cpap-with oxygen    Past Surgical History:  Procedure Laterality Date   BIOPSY  04/27/2020   Procedure: BIOPSY;  Surgeon: Donnald Charleston, MD;  Location: [facility];  Service: Endoscopy;;   BUNIONECTOMY WITH HAMMERTOE RECONSTRUCTION Right 12/10/2012   Procedure: RIGHT FIRST METATARSAL CHEVRON BUNION CORRECTION,  2 AND 3 HAMMERTOE CORRECTION , RIGHT 3 AND 4 TOE NAIL EXCISION ;  Surgeon: Norleen Armor, MD;  Location: [facility];  Service: Orthopedics;  Laterality: Right;   COLONOSCOPY WITH PROPOFOL  N/A 04/27/2020   Procedure: COLONOSCOPY WITH PROPOFOL ;  Surgeon: Donnald Charleston, MD;  Location: [facility];  Service: Endoscopy;   Laterality: N/A;   COLONOSCOPY WITH PROPOFOL  N/A 06/17/2023   Procedure: COLONOSCOPY WITH PROPOFOL ;  Surgeon: Eartha Angelia Sieving, MD;  [facility];  Service: Gastroenterology;  Laterality: N/A;  2:30PM;ASA 3   FOOT ARTHRODESIS  2000   both feet   LIPOSUCTION  03/2018   POLYPECTOMY  06/17/2023   Procedure: POLYPECTOMY;  Surgeon: Eartha Angelia, Sieving, MD;  Location: [facility];  Service: Gastroenterology;;   RECTAL SURGERY     TONSILLECTOMY     TOTAL ABDOMINAL HYSTERECTOMY     Family History  Problem Relation Age of Onset   Diabetes Maternal Grandmother    Diabetes Maternal Grandfather    COPD Father    Diabetes Mother    Other Mother        vertigo; chronic eye disease   Alcohol  abuse Brother    Alcohol  abuse Brother    COPD Brother    Alcohol  abuse Brother    Alcohol  abuse Brother    Other Brother        aneursym   Heart attack Sister    COPD Sister    Diabetes Sister    Other Sister        hearing problems   Hyperlipidemia Sister    Breast cancer Neg Hx    Social History   Socioeconomic History   Marital status: Married    Spouse name: Not on file   Number of children: 5   Years of education: 9th   Highest education level: Not on file  Occupational History   Occupation: Disabled  Tobacco Use   Smoking status: Former    Current packs/day: 0.25    Average packs/day: 1 pack/day for 43.5 years (42.4 ttl pk-yrs)    Types: Cigarettes    Start date: 06/11/1975    Quit date: 06/10/2017   Smokeless tobacco: Never  Vaping Use   Vaping status: Never Used  Substance and Sexual Activity   Alcohol  use: No    Alcohol /week: 0.0 standard drinks of alcohol    Drug use: No   Sexual activity: Not Currently    Birth control/protection: Surgical    Comment: hyst  Other Topics Concern   Not on file  Social History Narrative   Lives at home with home with her son (has five children).   Right-handed.   1 cup caffeine per day.   Social Drivers of Manufacturing engineer Strain: High Risk (  12/26/2023)   Overall Financial Resource Strain (CARDIA)    Difficulty of Paying Living Expenses: Hard  Food Insecurity: Food Insecurity Present (12/26/2023)   Hunger Vital Sign    Worried About Running Out of Food in the Last Year: Often true    Ran Out of Food in the Last Year: Sometimes true  Transportation Needs: No Transportation Needs (12/26/2023)   PRAPARE - Administrator, Civil Service (Medical): No    Lack of Transportation (Non-Medical): No  Physical Activity: Sufficiently Active (12/26/2023)   Exercise Vital Sign    Days of Exercise per Week: 7 days    Minutes of Exercise per Session: 30 min  Stress: Stress Concern Present (12/26/2023)   [employer] of Occupational Health - Occupational Stress Questionnaire    Feeling of Stress: Rather much  Social Connections: Socially Integrated (12/26/2023)   Social Connection and Isolation Panel    Frequency of Communication with Friends and Family: More than three times a week    Frequency of Social Gatherings with Friends and Family: More than three times a week    Attends Religious Services: More than 4 times per year    Active Member of [employer] or Organizations: Yes    Attends Engineer, structural: More than 4 times per year    Marital Status: Living with partner    Tobacco Counseling Counseling given: Yes    Clinical Intake:  Pre-visit preparation completed: Yes  Pain : No/denies pain     BMI - recorded: 25.1 Nutritional Status: BMI 25 -29 Overweight Diabetes: Yes CBG done?: No (telehealth visit. unable to obtain cbg. patient got her cbg while she was on the phone. per patient it was 200 ml/dl. while on the phone for her AWV she took 10 units insulin .) Did pt. bring in CBG monitor from home?: No  Lab Results  Component Value Date   HGBA1C 6.9 (H) 12/18/2023   HGBA1C 5.6 08/12/2023   HGBA1C 6.3 (H) 02/25/2023     How often do you need to have someone  help you when you read instructions, pamphlets, or other written materials from your doctor or pharmacy?: 5 - Always  Interpreter Needed?: No  Information entered by :: Stefano ORN [facility]   Activities of Daily Living     12/26/2023   10:38 AM 06/10/2023   10:17 AM  In your present state of health, do you have any difficulty performing the following activities:  Hearing? 1   Vision? 0   Difficulty concentrating or making decisions? 1   Walking or climbing stairs? 0   Dressing or bathing? 0   Doing errands, shopping? 0 1  Using the Toilet? N   In the past six months, have you accidently leaked urine? Y   Do you have problems with loss of bowel control? N   Managing your Medications? N   Managing your Finances? N   Housekeeping or managing your Housekeeping? N     Patient Care Team: Severa Rock HERO, FNP as PCP - General (Family Medicine) Tasia Lung, MD as Consulting Physician (Psychiatry) Milton Lade, MD as Consulting Physician (Neurology) Billee Mliss BIRCH, [facility] (Pharmacist) Darroll Anes, DO (Optometry) Hope Almarie ORN, NP as Nurse Practitioner (Pulmonary Disease) Porter Andrez JONELLE RIGGERS (Dermatology) Bonner Ade, MD as Consulting Physician (Physical Medicine and Rehabilitation) O'Neal, Darryle Ned, MD as Consulting Physician (Cardiology) Signa Delon LABOR, NP as Nurse Practitioner (Obstetrics and Gynecology)  I have updated your Care Teams any recent Medical Services you [phone]  have received from other providers in the past year.     Assessment:   This is a routine wellness examination for Cynthia Deleon.  Hearing/Vision screen Hearing Screening - Comments:: Patient wears hearing aids. She currently has a referral to [facility]  Vision Screening - Comments:: Wears rx glasses - up to date with routine eye exams with  Oneil Kawasaki w/ [facility] [place] location   Goals Addressed             This Visit's Progress    Exercise 3x per week  (30 min per time)   On track    Patient Stated       I would like to have less vertigo attacks.         Depression Screen     12/26/2023   10:50 AM 12/18/2023    3:45 PM 08/12/2023    2:22 PM 01/17/2023    9:56 AM 11/22/2022    3:06 PM 10/28/2022    3:21 PM 10/22/2022    1:11 PM  PHQ 2/9 Scores  PHQ - 2 Score 1 0 0 0  6 0  PHQ- 9 Score 5 7 3  0  24 6  Exception Documentation     Patient refusal      Fall Risk     12/26/2023   10:03 AM 12/18/2023    3:44 PM 08/12/2023    2:22 PM 01/17/2023    9:56 AM 10/28/2022    3:21 PM  Fall Risk   Falls in the past year? 1 0 0 0 1  Number falls in past yr: 1    1  Injury with Fall? 1    1  Risk for fall due to : History of fall(s);Impaired balance/gait;Impaired mobility  History of fall(s)  History of fall(s);Medication side effect  Follow up Falls evaluation completed;Education provided;Falls prevention discussed  Falls evaluation completed Falls evaluation completed     MEDICARE RISK AT HOME:  Medicare Risk at Home Any stairs in or around the home?: Yes If so, are there any without handrails?: No Home free of loose throw rugs in walkways, pet beds, electrical cords, etc?: Yes Adequate lighting in your home to reduce risk of falls?: Yes Life alert?: No Use of a cane, walker or w/c?: Yes Grab bars in the bathroom?: No Shower chair or bench in shower?: No Elevated toilet seat or a handicapped toilet?: Yes  TIMED UP AND GO:  Was the test performed?  No  Cognitive Function: 6CIT completed        12/26/2023   10:38 AM 08/21/2022    1:30 PM  6CIT Screen  What Year? 0 points 0 points  What month? 0 points 0 points  What time? 0 points 0 points  Count back from 20 0 points 0 points  Months in reverse 4 points 0 points  Repeat phrase 4 points 0 points  Total Score 8 points 0 points    Immunizations Immunization History  Administered Date(s) Administered   DTaP 02/23/1957, 03/23/1957, 04/27/1957, 10/11/1959   Fluad Quad(high Dose  65+) 03/08/2021, 03/07/2022   Fluzone Influenza virus vaccine,trivalent (IIV3), split virus 03/26/2016, 02/17/2019   Hep A / Hep B 05/23/2023, 07/12/2023, 12/18/2023   IPV 10/26/1957, 03/14/1961, 02/13/1962, 11/22/1965   Influenza Inj Mdck Quad With Preservative 03/11/2018   Influenza Split 05/08/2004, 05/09/2005   Influenza, High Dose Seasonal PF 05/26/2023   Influenza,inj,Quad PF,6+ Mos 02/18/2017, 02/17/2019   Influenza,inj,quad, With Preservative 04/03/2015, 04/10/2018, 02/17/2019  Influenza-Unspecified 03/10/2020, 05/26/2023   MMR 10/23/2018   Moderna SARS-COV2 Booster Vaccination 06/07/2020   Moderna Sars-Covid-2 Vaccination 08/19/2019, 09/23/2019   PNEUMOCOCCAL CONJUGATE-20 03/29/2021   Pneumococcal Conjugate-13 03/24/2014, 10/01/2017   Pneumococcal Polysaccharide-23 04/29/2007   Respiratory Syncytial Virus Vaccine,Recomb Aduvanted(Arexvy) 07/25/2022   Smallpox 10/11/1959   Tdap 11/20/2017   Zoster Recombinant(Shingrix) 03/29/2021, 07/25/2022   Zoster, Live 04/16/2013    Screening Tests Health Maintenance  Topic Date Due   Lung Cancer Screening  12/11/2022   MAMMOGRAM  07/09/2023   OPHTHALMOLOGY EXAM  12/25/2023   Hepatitis C Screening  08/11/2024 (Originally 12/29/1973)   COVID-19 Vaccine (3 - Moderna risk series) 01/02/2025 (Originally 07/05/2020)   INFLUENZA VACCINE  01/09/2024   DEXA SCAN  02/07/2024   Diabetic kidney evaluation - Urine ACR  02/25/2024   HEMOGLOBIN A1C  06/19/2024   FOOT EXAM  08/11/2024   Diabetic kidney evaluation - eGFR measurement  10/18/2024   Medicare Annual Wellness (AWV)  12/25/2024   DTaP/Tdap/Td (6 - Td or Tdap) 11/21/2027   Colonoscopy  06/16/2028   Pneumococcal Vaccine: 50+ Years  Completed   Hepatitis B Vaccines  Completed   Zoster Vaccines- Shingrix  Completed   HPV VACCINES  Aged Out   Meningococcal B Vaccine  Aged Out    Health Maintenance  Health Maintenance Due  Topic Date Due   Lung Cancer Screening  12/11/2022    MAMMOGRAM  07/09/2023   OPHTHALMOLOGY EXAM  12/25/2023   Health Maintenance Items Addressed: Mammogram scheduled, DEXA ordered, Referral sent to [facility] (smoker/hx smoking), Referral sent to Optometry/Ophthalmology, CRR placed  Additional Screening:  Vision Screening: Recommended annual ophthalmology exams for early detection of glaucoma and other disorders of the eye. Would you like a referral to an eye doctor? Yes    Dental Screening: Recommended annual dental exams for proper oral hygiene  Community Resource Referral / Chronic Care Management: CRR required this visit?  Yes   CCM required this visit?  No   Plan:    I have personally reviewed and noted the following in the patient's chart:   Medical and social history Use of alcohol , tobacco or illicit drugs  Current medications and supplements including opioid prescriptions. Patient is currently taking opioid prescriptions. Information provided to patient regarding non-opioid alternatives. Patient advised to discuss non-opioid treatment plan with their provider. Functional ability and status Nutritional status Physical activity Advanced directives List of other physicians Hospitalizations, surgeries, and ER visits in previous 12 months Vitals Screenings to include cognitive, depression, and falls Referrals and appointments  In addition, I have reviewed and discussed with patient certain preventive protocols, quality metrics, and best practice recommendations. A written personalized care plan for preventive services as well as general preventive health recommendations were provided to patient.   Mattheu Brodersen, CMA   12/26/2023   After Visit Summary: (Mail) Due to this being a telephonic visit, the after visit summary with patients personalized plan was offered to patient via mail   Notes: Please refer to Routing Comments.

## 2023-12-28 ENCOUNTER — Other Ambulatory Visit: Payer: Self-pay | Admitting: Family Medicine

## 2023-12-28 DIAGNOSIS — E782 Mixed hyperlipidemia: Secondary | ICD-10-CM

## 2023-12-30 NOTE — Progress Notes (Unsigned)
 Complex Care Management Note Care Guide Note  12/30/2023 Name: Cynthia Deleon MRN: [id number] DOB: [birth year]   Complex Care Management Outreach Attempts: An unsuccessful telephone outreach was attempted today to offer the patient information about available complex care management services.  Follow Up Plan:  Additional outreach attempts will be made to offer the patient complex care management information and services.   Encounter Outcome:  No Answer  Jeoffrey Buffalo , RMA     [place]  [facility], [facility] Guide  Direct Dial : [phone]  Website: [email or web address]

## 2023-12-31 ENCOUNTER — Ambulatory Visit

## 2023-12-31 ENCOUNTER — Ambulatory Visit (INDEPENDENT_AMBULATORY_CARE_PROVIDER_SITE_OTHER)

## 2023-12-31 DIAGNOSIS — E11649 Type 2 diabetes mellitus with hypoglycemia without coma: Secondary | ICD-10-CM | POA: Diagnosis not present

## 2023-12-31 DIAGNOSIS — G4733 Obstructive sleep apnea (adult) (pediatric): Secondary | ICD-10-CM | POA: Diagnosis not present

## 2023-12-31 LAB — COLOR FUNDUS PHOTOGRAPHY - OU - BOTH EYES

## 2023-12-31 NOTE — Progress Notes (Unsigned)
 Arrived on 12/31/2023 and has given verbal consent to obtain images and complete their overdue diabetic retinal screening.  The images have been sent to an ophthalmologist or optometrist for review and interpretation.  Results will be sent back to [facility] for review.  Patient has been informed they will be contacted when we receive the results via telephone or MyChart.  Patient confirmed that the last primary eye doctor she had was at [facility] in [place]. Her last appt with them was about a year ago.  Patient reports blurred vision OU because she says she has cataracts.

## 2023-12-31 NOTE — Progress Notes (Signed)
 Care Guide Pharmacy Note  12/31/2023 Name: [name] MRN: [id number] DOB: 1955-11-10  Referred By: Severa Rock HERO, FNP Reason for referral: Complex Care Management (Outreach to schedule with f/u Pharm d and schedule referral with RNCM and BSW )   Cynthia Deleon is a 68 y.o. year old female who is a primary care patient of Severa Rock HERO, FNP.  Devere MARLA Brunswick was referred to the pharmacist for assistance related to: DMII  Successful contact was made with the patient to discuss pharmacy services including being ready for the pharmacist to call at least 5 minutes before the scheduled appointment time and to have medication bottles and any blood pressure readings ready for review. The patient agreed to meet with the pharmacist via telephone visit on (date/time).01/21/2024  Jeoffrey Buffalo , RMA     [place]  [facility], [facility] Guide  Direct Dial : [phone]  Website: [email or web address]

## 2023-12-31 NOTE — Progress Notes (Signed)
 Complex Care Management Note  Care Guide Note 12/31/2023 Name: Cynthia Deleon MRN: [id number] DOB: Aug 28, 1955  Cynthia Deleon is a 68 y.o. year old female who sees Rakes, Rock HERO, FNP for primary care. I reached out to Devere MARLA Brunswick by phone today to offer complex care management services.  Ms. Longhi was given information about Complex Care Management services today including:   The Complex Care Management services include support from the care team which includes your Nurse Care Manager, Clinical Social Worker, or Pharmacist.  The Complex Care Management team is here to help remove barriers to the health concerns and goals most important to you. Complex Care Management services are voluntary, and the patient may decline or stop services at any time by request to their care team member.   Complex Care Management Consent Status: Patient agreed to services and verbal consent obtained.   Follow up plan:  Telephone appointment with complex care management team member scheduled for:  BSW 01/01/2024 RNCM 01/09/2024  Encounter Outcome:  Patient Scheduled  Jeoffrey Buffalo , RMA     [place]  [facility], [facility] Guide  Direct Dial : [phone]  Website: [email or web address]

## 2024-01-01 ENCOUNTER — Telehealth: Payer: Self-pay

## 2024-01-01 DIAGNOSIS — M5416 Radiculopathy, lumbar region: Secondary | ICD-10-CM | POA: Diagnosis not present

## 2024-01-01 NOTE — Patient Instructions (Signed)
 Visit Information  Thank you for taking time to visit with me today. Please don't hesitate to contact me if I can be of assistance to you before our next scheduled appointment.  Our next appointment is by telephone on 01/08/24 at 1pm  Please call the care guide team at [phone] if you need to cancel or reschedule your appointment.   Following is a copy of your care plan:   Goals Addressed             This Visit's Progress    BSW VBCI Social Work Care Plan       Problems:   DME (light weight wheelchair and diabetic shoes)  CSW Clinical Goal(s):   Over the next 1 weeks the Patient will await providers review for order for wheelchair.  Interventions:  Social Determinants of Health in Patient with COPD, DMII, and HTN: SDOH assessments completed: DME (light weight wheelchair and diabetic shoes) Evaluation of current treatment plan related to unmet needs SW t/c [facility] and several DME supplies. [employer] in [place] has a light weight wheelchair.  Provider will be contacted to provide instruction for orders. SW called several companies but none provide diabetic shoes.  Patient Goals/Self-Care Activities:  Patient will await providers review for order for wheelchair.  Patient will also call around for diabetic shoe options.  Plan:   Telephone follow up appointment with care management team member scheduled for:  01/08/24 at 1pm        Please call 911 if you are experiencing a Mental Health or Behavioral Health Crisis or need someone to talk to.  Patient verbalizes understanding of instructions and care plan provided today and agrees to view in MyChart. Active MyChart status and patient understanding of how to access instructions and care plan via MyChart confirmed with patient.     Tillman Gardener, BSW [place]  [facility], [facility] Social Worker Direct Dial : [phone]  Fax: [phone] Website: [email or web address]

## 2024-01-01 NOTE — Patient Outreach (Signed)
 Complex Care Management   Visit Note  01/01/2024  Name:  Cynthia Deleon MRN: [id number] DOB: 15-Jun-1955  Situation: Referral received for Complex Care Management related to SDOH Barriers:  DME light wheelchair and diabetic shoes I obtained verbal consent from Patient.  Visit completed with patient  on the phone  Background:   Past Medical History:  Diagnosis Date   Aortic atherosclerosis (HCC) 10/31/2021   Arthritis    hands and knees   Bipolar 1 disorder (HCC)    Borderline diabetic    Chronic back pain    Chronic neck pain    Complication of anesthesia    high anxiety-does not want to be alone   COPD (chronic obstructive pulmonary disease) (HCC)    Current use of estrogen therapy 01/12/2014   Depression    Diabetes mellitus without complication (HCC)    Diplopia    Family history of adverse reaction to anesthesia    sister gas in lungs   GERD (gastroesophageal reflux disease)    Headache    Hepatic steatosis 10/31/2021   Hot flashes 12/15/2013   Hypertension    IBS (irritable bowel syndrome)    Incontinence    Kidney stone    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HCC)    Osteopenia 02/06/2022   Panic attacks    Peptic ulcer    Peripheral neuropathy    Rosacea    Shingles    Sleep apnea    uses a cpap-with oxygen     Assessment:  Patient reports she can't use a heavy wheelchair and wants a light weight wheelchair.  Patient also wants diabetic shoes.  SW t/c [employer] and [facility] and neither provide light weight wheelchairs or diabetic shoes. SW T/c [employer] and provided [place] [phone] referral line but they direct SW to [employer] that no longer provide diabetic shoes.  SW t/c [employer] [facility] and is referred to several DME suppliers that do not provide the wheelchair.  [employer] in [place] [phone] has the light weight wheelchairs but is not answering to confirm the weight of the chair.  Assessment will be completed at the next visit.  SDOH Interventions     Flowsheet Row Clinical Support from 12/26/2023 in [facility] Visit from 12/18/2023 in [facility] Visit from 08/12/2023 in [facility] Visit from 10/22/2022 in [facility] Visit from 08/22/2022 in [facility] Clinical Support from 08/21/2022 in [facility]  SDOH Interventions        Food Insecurity Interventions AMB Referral -- -- -- -- Intervention Not Indicated  Housing Interventions Intervention Not Indicated -- -- -- -- Intervention Not Indicated  Transportation Interventions Intervention Not Indicated -- -- -- -- Intervention Not Indicated  Utilities Interventions Intervention Not Indicated -- -- -- -- Intervention Not Indicated  Alcohol  Usage Interventions Intervention Not Indicated (Score <7) -- -- -- -- Intervention Not Indicated (Score <7)  Depression Interventions/Treatment  Currently on Treatment Currently on Treatment Currently on Treatment Currently on Treatment PHQ2-9 Score <4 Follow-up Not Indicated --  Financial Strain Interventions Community Resources Provided, Other (Comment)  [referral placed] -- -- -- -- Intervention Not Indicated  Physical Activity Interventions Intervention Not Indicated -- -- -- -- Patient Refused, Other (Comments)  Stress Interventions Other (Comment)  [patient is states she is a worry wart she is also worried about everything that is going on with her  health. sometimes feels unheard by her providers.] -- -- -- -- Intervention Not Indicated  Social Connections Interventions Intervention Not Indicated -- -- -- -- Intervention Not Indicated  Health Literacy Interventions Patient declines to respond -- -- -- -- --    Recommendation:   SW communicated with provider with instructions from [facility] office.  Follow Up Plan:    Telephone follow up appointment date/time:  01/08/24 1pm  Tillman Gardener, BSW [place]  [facility], [facility] Social Worker Direct Dial : [phone]  Fax: [phone] Website: [email or web address]

## 2024-01-02 ENCOUNTER — Ambulatory Visit: Payer: Self-pay | Admitting: Family Medicine

## 2024-01-02 ENCOUNTER — Ambulatory Visit: Payer: Self-pay

## 2024-01-02 NOTE — Telephone Encounter (Signed)
 FYI Only or Action Required?: Action required by provider: would like to know if she should go back on her diabetic pills.  Patient was last seen in primary care on 12/18/2023 by Severa Rock HERO, FNP.  Called Nurse Triage reporting Blood Sugar Problem.  Symptoms began today.  Interventions attempted: Prescription medications: humalog .  Symptoms are: gradually worsening.  Triage Disposition: Call PCP Now  Patient/caregiver understands and will follow disposition?: Yes     Copied from CRM [id number]. Topic: Clinical - Red Word Triage >> Jan 02, 2024  3:26 PM Larissa S wrote: [facility] that prompted transfer to Nurse Triage: Blood sugar is 319 Reason for Disposition  Blood glucose > 400 mg/dL (77.7 mmol/L)  Answer Assessment - Initial Assessment Questions 1. BLOOD GLUCOSE: What is your blood glucose level?      323 now 2. ONSET: When did you check the blood glucose?     today 3. USUAL RANGE: What is your glucose level usually? (e.g., usual fasting morning value, usual evening value)     124-180 4. KETONES: Do you check for ketones (urine or blood test strips)? If Yes, ask: What does the test show now?      na 5. TYPE 1 or 2:  Do you know what type of diabetes you have?  (e.g., Type 1, Type 2, Gestational; doesn't know)      2 6. INSULIN : Do you take insulin ? What type of insulin (s) do you use? What is the mode of delivery? (syringe, pen; injection or pump)?      Yes, three times today 7. DIABETES PILLS: Do you take any pills for your diabetes? If Yes, ask: Have you missed taking any pills recently?     denies 8. OTHER SYMPTOMS: Do you have any symptoms? (e.g., fever, frequent urination, difficulty breathing, dizziness, weakness, vomiting)     denies 9. PREGNANCY: Is there any chance you are pregnant? When was your last menstrual period?     na  Protocols used: Diabetes - High Blood Sugar-A-AH

## 2024-01-06 ENCOUNTER — Ambulatory Visit: Payer: Self-pay

## 2024-01-06 NOTE — Telephone Encounter (Signed)
 FYI Only or Action Required?: Action required by provider: update on patient condition.  Patient was last seen in primary care on 12/18/2023 by Cynthia Rock HERO, FNP.  Called Nurse Triage reporting Hyperglycemia.  Symptoms began several days ago.  Interventions attempted: Prescription medications: insulin  and Rest, hydration, or home remedies.  Symptoms are: unchanged.  Triage Disposition: Call PCP Within 24 Hours  Patient/caregiver understands and will follow disposition?:   Copied from CRM #[id number]. Topic: Clinical - Red Word Triage >> Jan 06, 2024  4:32 PM Jasmin G wrote: Red Word that prompted transfer to Nurse Triage: Pt states that her sugar has been spiking over 350 being the highest since Tuesday after getting a steroid shot Reason for Disposition  [1] Caller has NON-URGENT medication or insulin  device (e.g., pump, continuous monitoring) question AND [2] triager unable to answer question  Answer Assessment - Initial Assessment Questions 1. BLOOD GLUCOSE: What is your blood glucose level?      184 2. ONSET: When did you check the blood glucose?     Prior to call. 3. USUAL RANGE: What is your glucose level usually? (e.g., usual fasting morning value, usual evening value)     123 4. KETONES: Do you check for ketones (urine or blood test strips)? If Yes, ask: What does the test show now?      N/A 5. TYPE 1 or 2:  Do you know what type of diabetes you have?  (e.g., Type 1, Type 2, Gestational; doesn't know)      Type 2 6. INSULIN : Do you take insulin ? What type of insulin (s) do you use? What is the mode of delivery? (syringe, pen; injection or pump)?      Yes-syringe 7. DIABETES PILLS: Do you take any pills for your diabetes? If Yes, ask: Have you missed taking any pills recently?     No pills 8. OTHER SYMPTOMS: Do you have any symptoms? (e.g., fever, frequent urination, difficulty breathing, dizziness, weakness, vomiting)     Frequent urination, nausea,  slight shortness of breath,   Patient reports high blood sugars after receiving steroid shot this past Thursday.  Protocols used: Diabetes - High Blood Sugar-A-AH

## 2024-01-06 NOTE — Telephone Encounter (Signed)
Noted  -LS

## 2024-01-07 ENCOUNTER — Telehealth: Payer: Self-pay | Admitting: Pharmacist

## 2024-01-07 DIAGNOSIS — E1165 Type 2 diabetes mellitus with hyperglycemia: Secondary | ICD-10-CM

## 2024-01-07 DIAGNOSIS — E1169 Type 2 diabetes mellitus with other specified complication: Secondary | ICD-10-CM

## 2024-01-07 MED ORDER — PIP PEN NEEDLES 32G X 4MM 32G X 4 MM MISC: 11 refills | Status: AC

## 2024-01-07 MED ORDER — INSULIN LISPRO (1 UNIT DIAL) 100 UNIT/ML (KWIKPEN): PEN_INJECTOR | SUBCUTANEOUS | 11 refills | Status: AC

## 2024-01-07 NOTE — Telephone Encounter (Signed)
   Patient reports BG in 300s after steroid back injection last Thursday.  She can't regain control of her blood sugar.  Counseled patient to increase water intake/limit carbohydrates to around 40 carbs per meal.  We usually prescribe short acting (Humalog ) insulin  for patient's steroid injections as blood sugar spikes.  She states she will have another injection coming up soon. Extensive counseling on administering 5 units only twice daily if blood sugar is >200 (around meal time).  She verbalized understanding.  This dosing has worked well for her in the past.  Pen needles also sent in to pharmacy.   Ayinde Swim Dattero Jenelle Drennon, PharmD, BCACP, CPP Clinical Pharmacist, [facility]

## 2024-01-08 ENCOUNTER — Other Ambulatory Visit: Payer: Self-pay

## 2024-01-08 NOTE — Patient Instructions (Signed)
 Visit Information  Thank you for taking time to visit with me today. Please don't hesitate to contact me if I can be of assistance to you before our next scheduled appointment.  Your next care management appointment is by telephone on 01/15/24 at 2pm   Please call the care guide team at [phone] if you need to cancel, schedule, or reschedule an appointment.   Please call 911 if you are experiencing a Mental Health or Behavioral Health Crisis or need someone to talk to.  Tillman Gardener, BSW [place]  [facility], [facility] Social Worker Direct Dial : [phone]  Fax: [phone] Website: [email or web address]

## 2024-01-08 NOTE — Patient Outreach (Signed)
 Complex Care Management   Visit Note  01/08/2024  Name:  Cynthia Deleon MRN: [id number] DOB: 1955-08-26  Situation: Referral received for Complex Care Management related to DME I obtained verbal consent from Patient.  Visit completed with patient  on the phone  Background:   Past Medical History:  Diagnosis Date   Aortic atherosclerosis (HCC) 10/31/2021   Arthritis    hands and knees   Bipolar 1 disorder (HCC)    Borderline diabetic    Chronic back pain    Chronic neck pain    Complication of anesthesia    high anxiety-does not want to be alone   COPD (chronic obstructive pulmonary disease) (HCC)    Current use of estrogen therapy 01/12/2014   Depression    Diabetes mellitus without complication (HCC)    Diplopia    Family history of adverse reaction to anesthesia    sister gas in lungs   GERD (gastroesophageal reflux disease)    Headache    Hepatic steatosis 10/31/2021   Hot flashes 12/15/2013   Hypertension    IBS (irritable bowel syndrome)    Incontinence    Kidney stone    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HCC)    Osteopenia 02/06/2022   Panic attacks    Peptic ulcer    Peripheral neuropathy    Rosacea    Shingles    Sleep apnea    uses a cpap-with oxygen     Assessment:  Patient has not been able to find a resource for diabetic shoes.  SW t/c [facility] and informed orders have not been received for a light weight wheelchair. SW agreed to follow up with the provider. Patient is provider [employer] and [employer] for options with diabetic shoes.  Patient is struggling to stay awake during call and SW will complete assessment during the next visit.  SDOH Interventions    Flowsheet Row Clinical Support from 12/26/2023 in [facility] Visit from 12/18/2023 in [facility] Visit from 08/12/2023 in [facility] Visit from 10/22/2022 in  [facility] Visit from 08/22/2022 in [facility] Clinical Support from 08/21/2022 in [facility]  SDOH Interventions        Food Insecurity Interventions AMB Referral -- -- -- -- Intervention Not Indicated  Housing Interventions Intervention Not Indicated -- -- -- -- Intervention Not Indicated  Transportation Interventions Intervention Not Indicated -- -- -- -- Intervention Not Indicated  Utilities Interventions Intervention Not Indicated -- -- -- -- Intervention Not Indicated  Alcohol  Usage Interventions Intervention Not Indicated (Score <7) -- -- -- -- Intervention Not Indicated (Score <7)  Depression Interventions/Treatment  Currently on Treatment Currently on Treatment Currently on Treatment Currently on Treatment PHQ2-9 Score <4 Follow-up Not Indicated --  Financial Strain Interventions Community Resources Provided, Other (Comment)  [referral placed] -- -- -- -- Intervention Not Indicated  Physical Activity Interventions Intervention Not Indicated -- -- -- -- Patient Refused, Other (Comments)  Stress Interventions Other (Comment)  [patient is states she is a worry wart she is also worried about everything that is going on with her health. sometimes feels unheard by her providers.] -- -- -- -- Intervention Not Indicated  Social Connections Interventions Intervention Not Indicated -- -- -- -- Intervention Not Indicated  Health Literacy Interventions Patient declines to respond -- -- -- -- --    Recommendation:  SW to follow up with provider on orders for wheelchair and diabetic shoes.  Follow Up Plan:   Telephone follow up appointment date/time:  01/15/24 at 2pm  Tillman Gardener, BSW [place]  [facility], [facility] Social Worker Direct Dial : [phone]  Fax: [phone] Website: [email or web address]

## 2024-01-09 ENCOUNTER — Other Ambulatory Visit: Payer: Self-pay | Admitting: *Deleted

## 2024-01-09 NOTE — Patient Instructions (Signed)
 Visit Information  Thank you for taking time to visit with me today. Please don't hesitate to contact me if I can be of assistance to you before our next scheduled appointment.  Our next appointment is by telephone on 01-22-2024 at 9:30 AM  Please call the care guide team at [phone] if you need to cancel or reschedule your appointment.   Following is a copy of your care plan:   Goals Addressed             This Visit's Progress    VBCI RN Care Plan   On track    Problems:  Chronic Disease Management support and education needs related to COPD  Goal: Over the next 90 days the Patient will attend all scheduled medical appointments: with providers as evidenced by completed encounters in EPIC         continue to work with Medical illustrator and/or Social Worker to address care management and care coordination needs related to COPD as evidenced by adherence to care management team scheduled appointments     take all medications exactly as prescribed and will call provider for medication related questions as evidenced by verbalization of taking all medications    verbalize basic understanding of COPD disease process and self health management plan as evidenced by following care plan recommended for COPD and verbalizing rationale.   Interventions:   COPD Interventions: Advised patient to track and manage COPD triggers Advised patient to self assesses COPD action plan zone and make appointment with provider if in the yellow zone for 48 hours without improvement Assessed social determinant of health barriers Discussed the importance of adequate rest and management of fatigue with COPD Provided education about and advised patient to utilize infection prevention strategies to reduce risk of respiratory infection Provided instruction about proper use of medications used for management of COPD including inhalers Provided patient with basic written and verbal COPD education on self  care/management/and exacerbation prevention Screening for signs and symptoms of depression related to chronic disease state   Patient Self-Care Activities:  Attend all scheduled provider appointments Call pharmacy for medication refills 3-7 days in advance of running out of medications Call provider office for new concerns or questions  Perform all self care activities independently  Take medications as prescribed   identify and remove indoor air pollutants limit outdoor activity during cold weather develop a rescue plan eliminate symptom triggers at home follow rescue plan if symptoms flare-up keep follow-up appointments: PCP 01/28/2024 ay 11:50  use an extra pillow to sleep don't eat or exercise right before bedtime practice relaxation or meditation daily do exercises in a comfortable position that makes breathing as easy as possible  Plan:  Telephone follow up appointment with care management team member scheduled for:  01-22-2024 at 9:30 AM          VBCI RN Care Plan   On track    Problems:  Chronic Disease Management support and education needs related to DMII  Goal: Over the next 90 days the Patient will attend all scheduled medical appointments: with providers as evidenced by completed encounter in EPIC        continue to work with RN Care Manager and/or Social Worker to address care management and care coordination needs related to DMII as evidenced by adherence to care management team scheduled appointments     take all medications exactly as prescribed and will call provider for medication related questions as evidenced by verbalization of taking medicines  verbalize basic understanding of DMII disease process and self health management plan as evidenced by following recommended care plan and verbalizing rationale.   Interventions:   Diabetes Interventions: Assessed patient's understanding of A1c goal: <6.5% Provided education to patient about basic DM disease  process Reviewed medications with patient and discussed importance of medication adherence Counseled on importance of regular laboratory monitoring as prescribed Discussed plans with patient for ongoing care management follow up and provided patient with direct contact information for care management team Review of patient status, including review of consultants reports, relevant laboratory and other test results, and medications completed Screening for signs and symptoms of depression related to chronic disease state  Assessed social determinant of health barriers Lab Results  Component Value Date   HGBA1C 6.9 (H) 12/18/2023    Patient Self-Care Activities:  Attend all scheduled provider appointments Call pharmacy for medication refills 3-7 days in advance of running out of medications Call provider office for new concerns or questions  Take medications as prescribed   check blood sugar at prescribed times: before meals and at bedtime check feet daily for cuts, sores or redness enter blood sugar readings and medication or insulin  into daily log take the blood sugar log to all doctor visits drink 6 to 8 glasses of water each day eat fish at least once per week fill half of plate with vegetables set a realistic goal keep feet up while sitting wash and dry feet carefully every day wear comfortable, cotton socks wear comfortable, well-fitting shoes  Plan:  The patient has been provided with contact information for the care management team and has been advised to call with any health related questions or concerns.              Please call the Suicide and Crisis Lifeline: 988 call the [place]  National Suicide Prevention Lifeline: 773-121-4115 or TTY: 534-840-2198 TTY 267-703-7634) to talk to a trained counselor call 1-800-273-TALK (toll free, 24 hour hotline) call the [facility]: 910-285-7302 call 911 if you are experiencing a Mental Health or Behavioral  Health Crisis or need someone to talk to.  The patient verbalized understanding of instructions, educational materials, and care plan provided today and agreed to receive a mailed copy of patient instructions, educational materials, and care plan.   Hendricks Her RN, BSN  [place] VBCI-Population Health RN Case Manager   Direct Dial [phone]

## 2024-01-09 NOTE — Patient Outreach (Signed)
 Complex Care Management   Visit Note  01/09/2024  Name:  Cynthia Deleon MRN: [id number] DOB: 1956-06-09  Situation: Referral received for Complex Care Management related to COPD and Diabetes with Complications I obtained verbal consent from Patient.  Visit completed with patient  on the phone  Background:   Past Medical History:  Diagnosis Date   Aortic atherosclerosis (HCC) 10/31/2021   Arthritis    hands and knees   Bipolar 1 disorder (HCC)    Borderline diabetic    Chronic back pain    Chronic neck pain    Complication of anesthesia    high anxiety-does not want to be alone   COPD (chronic obstructive pulmonary disease) (HCC)    Current use of estrogen therapy 01/12/2014   Depression    Diabetes mellitus without complication (HCC)    Diplopia    Family history of adverse reaction to anesthesia    sister gas in lungs   GERD (gastroesophageal reflux disease)    Headache    Hepatic steatosis 10/31/2021   Hot flashes 12/15/2013   Hypertension    IBS (irritable bowel syndrome)    Incontinence    Kidney stone    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HCC)    Osteopenia 02/06/2022   Panic attacks    Peptic ulcer    Peripheral neuropathy    Rosacea    Shingles    Sleep apnea    uses a cpap-with oxygen     Assessment: Patient Reported Symptoms:  Cognitive Cognitive Status: No symptoms reported   Health Maintenance Behaviors: Annual physical exam Healing Pattern: Average Health Facilitated by: Rest, Pain control  Neurological Neurological Review of Symptoms: No symptoms reported Neurological Management Strategies: Routine screening Neurological Self-Management Outcome: 4 (good)  HEENT HEENT Symptoms Reported: No symptoms reported HEENT Self-Management Outcome: 4 (good)    Cardiovascular Cardiovascular Symptoms Reported: No symptoms reported Does patient have uncontrolled Hypertension?: No Cardiovascular Management Strategies: Coping strategies Weight: 167 lb (75.8  kg) (3 days ago) Cardiovascular Self-Management Outcome: 4 (good)  Respiratory Respiratory Symptoms Reported: No symptoms reported Respiratory Management Strategies: Routine screening, Coping strategies  Endocrine Endocrine Symptoms Reported: No symptoms reported Is patient diabetic?: Yes Endocrine Self-Management Outcome: 4 (good)  Gastrointestinal Gastrointestinal Symptoms Reported: Diarrhea (this am) Gastrointestinal Management Strategies: Coping strategies Gastrointestinal Self-Management Outcome: 4 (good)    Genitourinary Genitourinary Symptoms Reported: No symptoms reported Genitourinary Management Strategies: Coping strategies Genitourinary Self-Management Outcome: 4 (good)  Integumentary Integumentary Symptoms Reported: No symptoms reported (patient states she has skin cancer; wants Dermatologist) Skin Management Strategies: Coping strategies Skin Self-Management Outcome: 4 (good)  Musculoskeletal Musculoskelatal Symptoms Reviewed: Back pain, Other Other Musculoskeletal Symptoms: LOwer back and neck Arthritis Musculoskeletal Management Strategies: Medication therapy, Routine screening Musculoskeletal Self-Management Outcome: 4 (good) Falls in the past year?: Yes (reports approximately one month ago) Number of falls in past year: 2 or more Was there an injury with Fall?: Yes (hit head on sliding glass doors  Did not go to hospital) Fall Risk Category Calculator: 3 Patient Fall Risk Level: High Fall Risk Patient at Risk for Falls Due to: History of fall(s), Impaired balance/gait, Impaired mobility Fall risk Follow up: Falls evaluation completed, Education provided, Falls prevention discussed  Psychosocial Psychosocial Symptoms Reported: Sadness - if selected complete PHQ 2-9, Anxiety - if selected complete GAD, Depression - if selected complete PHQ 2-9 Behavioral Management Strategies: Adequate rest, Coping strategies, Medication therapy Behavioral Health Self-Management Outcome:  3 (uncertain) Major Change/Loss/Stressor/Fears (CP): Denies Behaviors When Feeling Stressed/Fearful: Daughter calling Techniques to  Cope with Loss/Stress/Change: Medication, Spiritual practice(s) Quality of Family Relationships: stressful Do you feel physically threatened by others?: No      01/09/2024    3:38 PM  Depression screen PHQ 2/9  Decreased Interest 2  Down, Depressed, Hopeless 2  PHQ - 2 Score 4  Altered sleeping 0  Tired, decreased energy 0  Change in appetite 0  Feeling bad or failure about yourself  1  Trouble concentrating 2  Moving slowly or fidgety/restless 0  Suicidal thoughts 0  PHQ-9 Score 7  Difficult doing work/chores Not difficult at all    There were no vitals filed for this visit.  Medications Reviewed Today     Reviewed by Kay Hendricks MATSU, RN (Case Manager) on 01/09/24 at 1516  Med List Status: <None>   Medication Order Taking? Sig Documenting Provider Last Dose Status Informant  albuterol  (VENTOLIN  HFA) 108 (90 Base) MCG/ACT inhaler [id number] Yes Inhale 1-2 puffs into the lungs every 6 (six) hours as needed for wheezing or shortness of breath. Severa Rock HERO, FNP  Active   ARIPiprazole  (ABILIFY ) 10 MG tablet [id number] Yes Take 10 mg by mouth every morning. [provider]  Active   Artificial Saliva (BIOTENE DRY MOUTH MOISTURIZING) SOLN [id number]  Use as directed 5 mLs in the mouth or throat in the morning and at bedtime.  Patient not taking: Reported on 01/09/2024   Severa Rock HERO, FNP  Active   aspirin EC 81 MG tablet [id number]  Take 81 mg by mouth daily. Swallow whole.  Patient not taking: Reported on 01/09/2024   [provider]  Active   atorvastatin  (LIPITOR) 80 MG tablet [id number] Yes Take 1 tablet (80 mg total) by mouth daily. Barbaraann Darryle Ned, MD  Active   cetirizine  (ZYRTEC ) 10 MG tablet [id number] Yes Take 1 tablet (10 mg total) by mouth daily. Severa Rock HERO, FNP  Active   cholecalciferol  (VITAMIN D ) 25 MCG tablet  [id number] Yes Take 1 tablet (1,000 Units total) by mouth daily. Jadine Toribio SQUIBB, MD  Active Self, Pharmacy Records  Continuous Glucose Receiver (FREESTYLE LIBRE 3 READER) [place] [id number] Yes with compatible Freestyle Libre 3 sensor to monitor glucose continuously. DX: E11.65 Severa Rock HERO, FNP  Active   Continuous Glucose Sensor (FREESTYLE LIBRE 3 PLUS SENSOR) MISC [id number] Yes Change sensor every 15 days. DX: E11.65 Severa Rock HERO, FNP  Active   diazepam  (VALIUM ) 5 MG tablet [id number] Yes Take 5 mg by mouth 2 (two) times daily. [provider]  Active   DULoxetine  (CYMBALTA ) 60 MG capsule [id number]  TAKE (1) CAPSULE BY MOUTH EVERY DAY.  Patient not taking: Reported on 01/09/2024   Onita Duos, MD  Active   estrogens , conjugated, (PREMARIN ) 0.3 MG tablet [id number] Yes TAKE (1) TABLET BY MOUTH ONCE DAILY. Severa Rock HERO, FNP  Active   ezetimibe  (ZETIA ) 10 MG tablet [id number] Yes TAKE (1) TABLET BY MOUTH ONCE DAILY. Severa Rock HERO, FNP  Active   fluorometholone (FML) 0.1 % ophthalmic suspension [id number] Yes SMARTSIG:In Eye(s) [provider]  Active   fluticasone  (FLONASE ) 50 MCG/ACT nasal spray [id number] Yes Place 2 sprays into both nostrils daily. Joesph Annabella HERO, FNP  Active   hyoscyamine  (LEVSIN  SL) 0.125 MG SL tablet [id number]  Place 1 tablet (0.125 mg total) under the tongue every 8 (eight) hours as needed (abdominal pain).  Patient not taking: Reported on 01/09/2024   Eartha Angelia Toribio, MD  Active   insulin  lispro (HUMALOG  [facility]) 100 UNIT/ML KwikPen [id number] Yes  Use 5 units 2 times daily with breakfast and dinner when blood sugar is >200 on steroids Rakes, Linda M, FNP  Active   Insulin  Pen Needle (PIP PEN NEEDLES 32G X ) 32G X 4 MM MISC [id number] Yes Use to inject insulin  2 times daily as needed. DX E11.65 Severa Rock HERO, FNP  Active   lamoTRIgine  (LAMICTAL ) 200 MG tablet [id number] Yes Take 200 mg by mouth at bedtime. For mood disorder. [provider]   Active Self, Pharmacy Records  lansoprazole  (PREVACID ) 30 MG capsule [id number]  TAKE (1) CAPSULE BY MOUTH ONCE DAILY.  Patient not taking: Reported on 01/09/2024   Severa Rock HERO, FNP  Active   lisinopril  (ZESTRIL ) 5 MG tablet [id number] Yes Take 1 tablet (5 mg total) by mouth daily. Severa Rock HERO, FNP  Active   meclizine  (ANTIVERT ) 25 MG tablet [id number] Yes Take 2 tablets (50 mg total) by mouth daily as needed for dizziness. Joesph Annabella HERO, FNP  Active   montelukast  (SINGULAIR ) 10 MG tablet [id number] Yes TAKE (1) TABLET BY MOUTH AT BEDTIME. Hope Almarie ORN, NP  Active   MOUNJARO  12.5 MG/0.5ML Pen [id number] Yes Inject 12.5 mg into the skin once a week. Severa Rock HERO, FNP  Active   MYRBETRIQ  50 MG TB24 tablet [id number] Yes Take 1 tablet (50 mg total) by mouth daily. Severa Rock HERO, FNP  Active   omeprazole  (PRILOSEC) 40 MG capsule [id number] Yes Take 1 capsule (40 mg total) by mouth daily. Castaneda Mayorga, Toribio, MD  Active   OneTouch Delica Lancets 30G [place] [id number] Yes Use to test blood sugar twice daily. DX: E11.9 Merlynn Niki FALCON, FNP  Active Self, Pharmacy Records  oxyCODONE  (ROXICODONE ) 15 MG immediate release tablet [id number] Yes Take 1 tablet (15 mg total) by mouth every 6 (six) hours as needed for pain. Maree, Pratik D, DO  Active   pregabalin  (LYRICA ) 100 MG capsule [id number] Yes Take 2 capsules by mouth 3 (three) times daily. [provider]  Active   promethazine  (PHENERGAN ) 12.5 MG tablet [id number] Yes Take 1 tablet (12.5 mg total) by mouth every 8 (eight) hours as needed for nausea or vomiting. Severa Rock HERO, FNP  Active   triamcinolone  cream (KENALOG ) 0.1 % [id number]  Apply 1 Application topically 2 (two) times daily.  Patient not taking: Reported on 01/09/2024   Severa Rock HERO, FNP  Active   umeclidinium-vilanterol (ANORO ELLIPTA ) 62.5-25 MCG/ACT AEPB [id number] Yes Inhale 1 puff into the lungs daily. Hope Almarie ORN, NP  Active   valACYclovir  (VALTREX ) 1000 MG  tablet [id number] Yes Take 1 tablet (1,000 mg total) by mouth daily. Signa Delon LABOR, NP  Active   vitamin B-12 1000 MCG tablet [id number] Yes Take 1 tablet (1,000 mcg total) by mouth daily. Jadine Toribio SQUIBB, MD  Active Self, Pharmacy Records           Med Note WORLEY, STEPHANE GORMAN [place] Nov 21, 2021  3:25 PM) Pt has not started yet  VRAYLAR 3 MG capsule [id number] Yes Take 3 mg by mouth at bedtime. [provider]  Active   Med List Note [place] HERO, [place] 05/22/22 1546): Not Rx'd by PCP: sertraline, valium , lamotrigine , and oxycodone , lyrica - [facility] 12/02/19.            Recommendation:   Continue Current Plan of Care  Follow Up Plan:   Telephone follow-up in 1 month  Hendricks Her RN, BSN  [place] [facility] RN Case Manager  Direct Dial [phone]

## 2024-01-13 ENCOUNTER — Telehealth: Payer: Self-pay

## 2024-01-13 DIAGNOSIS — R42 Dizziness and giddiness: Secondary | ICD-10-CM

## 2024-01-13 NOTE — Telephone Encounter (Signed)
 Copied from CRM #[id number]. Topic: Clinical - Order For Equipment >> Jan 13, 2024  4:31 PM Delon T wrote: Reason for CRM: returning call from office- asking for lightweight wheelchair for vertigo attacks- [phone]- would also like an update on pullups and wipes, she is incontinent

## 2024-01-13 NOTE — Telephone Encounter (Signed)
 Lmtcb What is the reason for wheelchair?

## 2024-01-13 NOTE — Telephone Encounter (Signed)
-----   Message from Marceline sent at 01/13/2024 11:13 AM EDT ----- Regarding: FW: Light weight wheelchair and diabetic shoes What is the reasoning for the wheelchair? Does she have new issues that need to be addressed? ----- Message ----- From: Milas Vinie BIRCH, CMA Sent: 01/13/2024   8:57 AM EDT To: Rock CHRISTELLA Bruns, FNP Subject: EMELDABETHA Mallory weight wheelchair and diabetic sho#   ----- Message ----- From: Bruns Rock CHRISTELLA, FNP Sent: 01/09/2024  11:31 AM EDT To: [facility] Clinical Subject: FW: Light weight wheelchair and diabetic sho#  Can print and fax this order for her to get her diabetic shoes. ----- Message ----- From: Carolynn Tillman NOVAK Sent: 01/08/2024   5:13 PM EDT To: Rock CHRISTELLA Bruns, FNP Subject: Mallory weight wheelchair and diabetic shoes     Dr. Bruns, I am working with Ms. Aisha to obtain a light weight wheelchair and diabetic shoes.  [employer] in [place] has the wheelchair and instructed me to provide you with the fax number if you approve orders.  Please fax to [phone].  The patient is still trying to find a resource that has diabetic shoes.  Tillman Carolynn, BSW [place]  [facility], [facility] Social Worker Direct Dial : [phone]  Fax:[phone] Website: [email or web address]

## 2024-01-14 ENCOUNTER — Telehealth: Payer: Self-pay

## 2024-01-14 DIAGNOSIS — R42 Dizziness and giddiness: Secondary | ICD-10-CM

## 2024-01-14 NOTE — Telephone Encounter (Signed)
 Copied from CRM [id number]. Topic: General - Other >> Jan 14, 2024  1:22 PM Yolanda T wrote: Reason for CRM: patient returning call from [place]. Please return patients call.

## 2024-01-14 NOTE — Addendum Note (Signed)
 Addended by: OLENA RAISIN C on: 01/14/2024 10:48 AM   Modules accepted: Orders

## 2024-01-14 NOTE — Telephone Encounter (Signed)
 DME printed and placed up front for pick up. If patient would like it faxed please get fax number and find out where.   Left detailed message and DME placed up front

## 2024-01-14 NOTE — Telephone Encounter (Signed)
 States wheelchair needs to go to [employer]- aware it will be faxed

## 2024-01-14 NOTE — Telephone Encounter (Signed)
 Patient calling back to speak with Harlene to let her know that Medicare is requiring more information and would like callback from [facility] to discuss.

## 2024-01-14 NOTE — Addendum Note (Signed)
 Addended by: OLENA HARLENE BROCKS on: 01/14/2024 04:26 PM   Modules accepted: Orders

## 2024-01-14 NOTE — Telephone Encounter (Signed)
 Patient will call back with information on where to fax wheelchair.

## 2024-01-15 ENCOUNTER — Other Ambulatory Visit: Payer: Self-pay

## 2024-01-15 NOTE — Patient Instructions (Signed)
 Visit Information  Thank you for taking time to visit with me today. Please don't hesitate to contact me if I can be of assistance to you before our next scheduled appointment.  Your next care management appointment is by telephone on 01/22/24 at 1pm   Please call the care guide team at [phone] if you need to cancel, schedule, or reschedule an appointment.   Please call 911 if you are experiencing a Mental Health or Behavioral Health Crisis or need someone to talk to.  Tillman Gardener, BSW [place]  [facility], [facility] Social Worker Direct Dial : [phone]  Fax: [phone] Website: [email or web address]

## 2024-01-15 NOTE — Patient Outreach (Signed)
 Complex Care Management   Visit Note  01/15/2024  Name:  Cynthia Deleon MRN: [id number] DOB: 1956-02-23  Situation: Referral received for Complex Care Management related to SDOH Barriers:  Transportation DME I obtained verbal consent from Patient.  Visit completed with patient  on the phone  Background:   Past Medical History:  Diagnosis Date   Aortic atherosclerosis (HCC) 10/31/2021   Arthritis    hands and knees   Bipolar 1 disorder (HCC)    Borderline diabetic    Chronic back pain    Chronic neck pain    Complication of anesthesia    high anxiety-does not want to be alone   COPD (chronic obstructive pulmonary disease) (HCC)    Current use of estrogen therapy 01/12/2014   Depression    Diabetes mellitus without complication (HCC)    Diplopia    Family history of adverse reaction to anesthesia    sister gas in lungs   GERD (gastroesophageal reflux disease)    Headache    Hepatic steatosis 10/31/2021   Hot flashes 12/15/2013   Hypertension    IBS (irritable bowel syndrome)    Incontinence    Kidney stone    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HCC)    Osteopenia 02/06/2022   Panic attacks    Peptic ulcer    Peripheral neuropathy    Rosacea    Shingles    Sleep apnea    uses a cpap-with oxygen     Assessment:  Patient has not followed up with diabetic shoes. Patient communicated with provider for wheelchair orders. Patient would like support with calling [facility] for transportation. SW will assist with contacting [employer] at next appointment.  SDOH Interventions    Flowsheet Row Patient Outreach Telephone from 01/15/2024 in [place] POPULATION HEALTH DEPARTMENT Patient Outreach Telephone from 01/09/2024 in [place] POPULATION HEALTH DEPARTMENT Clinical Support from 12/26/2023 in [facility] Office Visit from 12/18/2023 in [place] [facility] [facility] Office Visit from 08/12/2023 in [facility] [facility]  Office Visit from 10/22/2022 in [facility]  SDOH Interventions        Food Insecurity Interventions Intervention Not Indicated Intervention Not Indicated AMB Referral -- -- --  Housing Interventions Intervention Not Indicated Intervention Not Indicated Intervention Not Indicated -- -- --  Transportation Interventions Intervention Not Indicated, Patient Resources (Friends/Family), Other (Comment) -- Intervention Not Indicated -- -- --  Utilities Interventions Intervention Not Indicated Intervention Not Indicated Intervention Not Indicated -- -- --  Alcohol  Usage Interventions -- -- Intervention Not Indicated (Score <7) -- -- --  Depression Interventions/Treatment  -- Medication, Referral to Psychiatry, Currently on Treatment, [employer] Referral Currently on Treatment Currently on Treatment Currently on Treatment Currently on Treatment  Financial Strain Interventions Intervention Not Indicated -- [employer] Provided, Other (Comment)  [referral placed] -- -- --  Physical Activity Interventions -- -- Intervention Not Indicated -- -- --  Stress Interventions -- -- Other (Comment)  [patient is states she is a worry wart she is also worried about everything that is going on with her health. sometimes feels unheard by her providers.] -- -- --  Social Connections Interventions -- Intervention Not Indicated Intervention Not Indicated -- -- --  Health Literacy Interventions -- -- Patient declines to respond -- -- --      Recommendation:   none  Follow Up Plan:   Telephone follow up appointment date/time:  01/22/24 at 1pm  [facility], BSW [facility]  [facility], [facility] Social Worker Direct Dial : [phone]  Fax: [phone] Website: [email or web address]

## 2024-01-20 NOTE — Telephone Encounter (Signed)
 [employer] is out of network for [employer] Faxed to [employer] per 1st TC re: this at [phone]

## 2024-01-21 ENCOUNTER — Other Ambulatory Visit: Payer: Self-pay | Admitting: Family Medicine

## 2024-01-21 ENCOUNTER — Other Ambulatory Visit (INDEPENDENT_AMBULATORY_CARE_PROVIDER_SITE_OTHER)

## 2024-01-21 DIAGNOSIS — E119 Type 2 diabetes mellitus without complications: Secondary | ICD-10-CM

## 2024-01-21 DIAGNOSIS — E1159 Type 2 diabetes mellitus with other circulatory complications: Secondary | ICD-10-CM

## 2024-01-21 DIAGNOSIS — I152 Hypertension secondary to endocrine disorders: Secondary | ICD-10-CM

## 2024-01-21 DIAGNOSIS — Z7985 Long-term (current) use of injectable non-insulin antidiabetic drugs: Secondary | ICD-10-CM

## 2024-01-21 DIAGNOSIS — E782 Mixed hyperlipidemia: Secondary | ICD-10-CM

## 2024-01-21 DIAGNOSIS — E11649 Type 2 diabetes mellitus with hypoglycemia without coma: Secondary | ICD-10-CM

## 2024-01-21 DIAGNOSIS — E1169 Type 2 diabetes mellitus with other specified complication: Secondary | ICD-10-CM

## 2024-01-21 MED ORDER — VALACYCLOVIR HCL 1 G PO TABS: 1000.0000 mg | ORAL_TABLET | Freq: Every day | ORAL | 12 refills | Status: AC

## 2024-01-21 MED ORDER — TIRZEPATIDE 15 MG/0.5ML ~~LOC~~ SOAJ: 15.0000 mg | SUBCUTANEOUS | 0 refills | Status: DC

## 2024-01-21 NOTE — Progress Notes (Signed)
 01/21/2024 Name: Cynthia Deleon MRN: [id number] DOB: 1956/06/02  Chief Complaint  Patient presents with   Diabetes    Cynthia Deleon is a 68 y.o. year old female who presented for a telephone visit.  I connected with  Cynthia Deleon on 01/21/24 by telephone and verified that I am speaking with the correct person using two identifiers. I discussed the limitations of evaluation and management by telemedicine. The patient expressed understanding and agreed to proceed.  Patient was located in her home and PharmD in PCP office during this visit.   They were referred to the pharmacist by their PCP for assistance in managing diabetes.    Subjective:  Patient reports her blood sugars are running in the 200s.  She has been on/off steroid injections for her back pain.  She has sliding scale insulin  for the week after she gets steroids.  She is interested in increasing Mounjaro  to seeks additional glycemic control and weight loss.  Care Team: Primary Care Provider: Severa Rock HERO, FNP   Medication Access/Adherence  Current Pharmacy:  [facility] [place], [place] - [address] [place] [address] Phone: [phone] Fax: [phone]  [facility] - [place], [place] - [address] [place] [address] Phone: [phone] Fax: [phone]  Patient reports affordability concerns with their medications: No  Patient reports access/transportation concerns to their pharmacy: No  Patient reports adherence concerns with their medications:  Yes  uses pill packaging now   Diabetes:   Current medications: Mounjaro  12.5mg  weekly Medications tried in the past: januvia , glipizide , metformin , trulicity    Current glucose readings: FBG<150, PPBG 200s Using FSL3+CGM Unable to provide me w/ FSL3+ readings  Patient denies hypoglycemic s/sx including dizziness, shakiness, sweating. Patient denies hyperglycemic  symptoms including polyuria, polydipsia, polyphagia, nocturia, neuropathy, blurred vision.   Current meal patterns:  Discussed meal planning options and Plate method for healthy eating Avoid sugary drinks and desserts Incorporate balanced protein, non starchy veggies, 1 serving of carbohydrate with each meal Increase water intake Increase physical activity as able   Current physical activity: encouraged as able   Current medication access support: medicare/caid  Objective:  Lab Results  Component Value Date   HGBA1C 6.9 (H) 12/18/2023    Lab Results  Component Value Date   CREATININE 0.88 10/19/2023   BUN 17 10/19/2023   NA 135 10/19/2023   K 4.8 10/19/2023   CL 103 10/19/2023   CO2 21 (L) 10/19/2023    Lab Results  Component Value Date   CHOL 180 08/12/2023   HDL 49 08/12/2023   LDLCALC 109 (H) 08/12/2023   TRIG 125 08/12/2023   CHOLHDL 3.7 08/12/2023    Medications Reviewed Today     Reviewed by Billee Mliss BIRCH, [facility] (Pharmacist) on 01/22/24 at 1610  Med List Status: <None>   Medication Order Taking? Sig Documenting Provider Last Dose Status Informant  albuterol  (VENTOLIN  HFA) 108 (90 Base) MCG/ACT inhaler [id number]  Inhale 1-2 puffs into the lungs every 6 (six) hours as needed for wheezing or shortness of breath. Severa Rock HERO, FNP  Active   ARIPiprazole  (ABILIFY ) 10 MG tablet [id number]  Take 10 mg by mouth every morning. [provider]  Active   Artificial Saliva (BIOTENE DRY MOUTH MOISTURIZING) SOLN [id number]  Use as directed 5 mLs in the mouth or throat in the morning and at bedtime.  Patient not taking: Reported on 01/09/2024   Severa Rock HERO, FNP  Active   aspirin EC 81 MG tablet [id number]  Take 81 mg by mouth daily. Swallow whole.  Patient not taking: Reported on 01/22/2024   [provider]  Active   atorvastatin  (LIPITOR) 80 MG tablet [id number]  Take 1 tablet (80 mg total) by mouth daily. Barbaraann Darryle Ned, MD  Active   cetirizine   (ZYRTEC ) 10 MG tablet [id number]  Take 1 tablet (10 mg total) by mouth daily. Severa Rock HERO, FNP  Active   cholecalciferol  (VITAMIN D ) 25 MCG tablet [id number]  Take 1 tablet (1,000 Units total) by mouth daily. Jadine Toribio SQUIBB, MD  Active Self, Pharmacy Records  Continuous Glucose Receiver (FREESTYLE LIBRE 3 READER) [place] [id number]  with compatible Freestyle Libre 3 sensor to monitor glucose continuously. DX: E11.65 Severa Rock HERO, FNP  Active   Continuous Glucose Sensor (FREESTYLE LIBRE 3 PLUS SENSOR) MISC [id number]  Change sensor every 15 days. DX: E11.65 Severa Rock HERO, FNP  Active   diazepam  (VALIUM ) 5 MG tablet [id number]  Take 5 mg by mouth 2 (two) times daily. [provider]  Active   DULoxetine  (CYMBALTA ) 60 MG capsule [id number]  TAKE (1) CAPSULE BY MOUTH EVERY DAY.  Patient not taking: Reported on 01/09/2024   Onita Duos, MD  Active   estrogens , conjugated, (PREMARIN ) 0.3 MG tablet [id number]  TAKE (1) TABLET BY MOUTH ONCE DAILY. Severa Rock HERO, FNP  Active   ezetimibe  (ZETIA ) 10 MG tablet [id number]  TAKE (1) TABLET BY MOUTH ONCE DAILY. Severa Rock HERO, FNP  Active   fluorometholone (FML) 0.1 % ophthalmic suspension [id number]  SMARTSIG:In Eye(s) [provider]  Active   fluticasone  (FLONASE ) 50 MCG/ACT nasal spray [id number]  Place 2 sprays into both nostrils daily. Joesph Annabella HERO, FNP  Active   hyoscyamine  (LEVSIN  SL) 0.125 MG SL tablet [id number]  Place 1 tablet (0.125 mg total) under the tongue every 8 (eight) hours as needed (abdominal pain).  Patient not taking: Reported on 01/09/2024   Eartha Flavors, Toribio, MD  Active   insulin  lispro (HUMALOG  [facility]) 100 UNIT/ML KwikPen [id number]  Use 5 units 2 times daily with breakfast and dinner when blood sugar is >200 on steroids Rakes, Linda M, FNP  Active   Insulin  Pen Needle (PIP PEN NEEDLES 32G X ) 32G X 4 MM MISC [id number]  Use to inject insulin  2 times daily as needed. DX E11.65 Severa Rock HERO, FNP  Active    lamoTRIgine  (LAMICTAL ) 200 MG tablet [id number]  Take 200 mg by mouth at bedtime. For mood disorder. [provider]  Active Self, Pharmacy Records  lansoprazole  (PREVACID ) 30 MG capsule [id number]  TAKE (1) CAPSULE BY MOUTH ONCE DAILY.  Patient not taking: Reported on 01/09/2024   Severa Rock HERO, FNP  Active   lisinopril  (ZESTRIL ) 5 MG tablet [id number]  Take 1 tablet (5 mg total) by mouth daily. Severa Rock HERO, FNP  Active   meclizine  (ANTIVERT ) 25 MG tablet [id number]  Take 2 tablets (50 mg total) by mouth daily as needed for dizziness. Joesph Annabella HERO, FNP  Active   montelukast  (SINGULAIR ) 10 MG tablet [id number]  TAKE (1) TABLET BY MOUTH AT BEDTIME. Hope Almarie ORN, NP  Active   MYRBETRIQ  50 MG TB24 tablet [id number]  Take 1 tablet (50 mg total) by mouth daily. Severa Rock HERO, FNP  Active   omeprazole  (PRILOSEC) 40 MG capsule [id number]  Take 1 capsule (40 mg total) by mouth daily. Castaneda Mayorga, Daniel, MD  Active   OneTouch Delica Lancets 30G  MISC [id number]  Use to test blood sugar twice daily. DX: E11.9 Merlynn Niki FALCON, FNP  Active Self, Pharmacy Records  oxyCODONE  (ROXICODONE ) 15 MG immediate release tablet [id number]  Take 1 tablet (15 mg total) by mouth every 6 (six) hours as needed for pain. Maree, Pratik D, DO  Active   pregabalin  (LYRICA ) 100 MG capsule [id number]  Take 2 capsules by mouth 3 (three) times daily. [provider]  Active   promethazine  (PHENERGAN ) 12.5 MG tablet [id number]  Take 1 tablet (12.5 mg total) by mouth every 8 (eight) hours as needed for nausea or vomiting. Severa Rock HERO, FNP  Active   tirzepatide  (MOUNJARO ) 15 MG/0.5ML Pen [id number] Yes Inject 15 mg into the skin once a week. DX: E11.9 Severa Rock HERO, FNP  Active   triamcinolone  cream (KENALOG ) 0.1 % [id number]  Apply 1 Application topically 2 (two) times daily.  Patient not taking: Reported on 01/09/2024   Severa Rock HERO, FNP  Active   umeclidinium-vilanterol (ANORO ELLIPTA ) 62.5-25 MCG/ACT  AEPB [id number]  Inhale 1 puff into the lungs daily. Hope Almarie ORN, NP  Active   valACYclovir  (VALTREX ) 1000 MG tablet [id number]  Take 1 tablet (1,000 mg total) by mouth daily. Please put in dispill Rakes, Rock HERO, FNP  Active   vitamin B-12 1000 MCG tablet [id number]  Take 1 tablet (1,000 mcg total) by mouth daily. Jadine Toribio SQUIBB, MD  Active Self, Pharmacy Records           Med Note WORLEY, STEPHANE GORMAN [place] Nov 21, 2021  3:25 PM) Pt has not started yet  VRAYLAR 3 MG capsule [id number]  Take 3 mg by mouth at bedtime. [provider]  Active   Med List Note Buena Rock HERO, [place] 05/22/22 1546): Not Rx'd by PCP: sertraline, valium , lamotrigine , and oxycodone , lyrica - [facility] 12/02/19.            Assessment/Plan:   Diabetes: - Currently uncontrolled--at goal per A1c, but reports PPBG in the 200s  - Reviewed long term cardiovascular and renal outcomes of uncontrolled blood sugar - Reviewed goal A1c, goal fasting, and goal 2 hour post prandial glucose - Recommend to increase Mounjaro  to 15mg  weekly Denies hypoglycemia  - Patient denies personal or family history of multiple endocrine neoplasia type 2, medullary thyroid  cancer; personal history of pancreatitis or gallbladder disease. - Recommend to check glucose using FSL3 plus CGM -no statin refills--will send to PCP for cosign; patient tolerating okay -no lisinopril  refills--sent to PCP -uses pill packaging for adherence   Follow Up Plan: 1 month  Mliss Tarry Griffin, PharmD, BCACP, CPP Clinical Pharmacist, [facility]

## 2024-01-22 ENCOUNTER — Other Ambulatory Visit: Payer: Self-pay | Admitting: *Deleted

## 2024-01-22 ENCOUNTER — Other Ambulatory Visit: Payer: Self-pay

## 2024-01-22 MED ORDER — ATORVASTATIN CALCIUM 80 MG PO TABS: 80.0000 mg | ORAL_TABLET | Freq: Every day | ORAL | 0 refills | Status: DC

## 2024-01-22 MED ORDER — LISINOPRIL 5 MG PO TABS: 5.0000 mg | ORAL_TABLET | Freq: Every day | ORAL | 1 refills | Status: DC

## 2024-01-22 NOTE — Patient Outreach (Signed)
 Complex Care Management   Visit Note  01/22/2024  Name:  Cynthia Deleon MRN: [id number] DOB: March 14, 1956  Situation: Referral received for Complex Care Management related to COPD and Diabetes with Complications I obtained verbal consent from Patient.  Visit completed with patient  on the phone  Background:   Past Medical History:  Diagnosis Date   Aortic atherosclerosis (HCC) 10/31/2021   Arthritis    hands and knees   Bipolar 1 disorder (HCC)    Borderline diabetic    Chronic back pain    Chronic neck pain    Complication of anesthesia    high anxiety-does not want to be alone   COPD (chronic obstructive pulmonary disease) (HCC)    Current use of estrogen therapy 01/12/2014   Depression    Diabetes mellitus without complication (HCC)    Diplopia    Family history of adverse reaction to anesthesia    sister gas in lungs   GERD (gastroesophageal reflux disease)    Headache    Hepatic steatosis 10/31/2021   Hot flashes 12/15/2013   Hypertension    IBS (irritable bowel syndrome)    Incontinence    Kidney stone    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HCC)    Osteopenia 02/06/2022   Panic attacks    Peptic ulcer    Peripheral neuropathy    Rosacea    Shingles    Sleep apnea    uses a cpap-with oxygen     Assessment: Patient Reported Symptoms:  Cognitive Cognitive Status: No symptoms reported Cognitive/Intellectual Conditions Management [RPT]: None reported or documented in medical history or problem list   Health Maintenance Behaviors: Annual physical exam Healing Pattern: Average Health Facilitated by: Rest  Neurological Neurological Review of Symptoms: Numbness Neurological Management Strategies: Medication therapy Neurological Self-Management Outcome: 3 (uncertain)  HEENT HEENT Symptoms Reported: No symptoms reported      Cardiovascular Cardiovascular Symptoms Reported: No symptoms reported Does patient have uncontrolled Hypertension?: No Cardiovascular  Management Strategies: Coping strategies, Routine screening Cardiovascular Self-Management Outcome: 4 (good)  Respiratory Respiratory Symptoms Reported: No symptoms reported    Endocrine Endocrine Symptoms Reported: No symptoms reported Is patient diabetic?: Yes Is patient checking blood sugars at home?: Yes List most recent blood sugar readings, include date and time of day: 01/22/24 0800 148 Endocrine Self-Management Outcome: 4 (good)  Gastrointestinal Gastrointestinal Symptoms Reported: Constipation Gastrointestinal Self-Management Outcome: 4 (good)    Genitourinary Genitourinary Symptoms Reported: No symptoms reported    Integumentary Integumentary Symptoms Reported: No symptoms reported    Musculoskeletal Musculoskelatal Symptoms Reviewed: Weakness Musculoskeletal Management Strategies: Medication therapy, Routine screening Falls in the past year?: Yes Number of falls in past year: 2 or more Was there an injury with Fall?: Yes Fall Risk Category Calculator: 3 Patient Fall Risk Level: High Fall Risk Patient at Risk for Falls Due to: History of fall(s) Fall risk Follow up: Falls evaluation completed, Education provided, Falls prevention discussed  Psychosocial Psychosocial Symptoms Reported: Depression - if selected complete PHQ 2-9   Major Change/Loss/Stressor/Fears (CP): Medical condition, self Techniques to Cope with Loss/Stress/Change: Medication Quality of Family Relationships: stressful Do you feel physically threatened by others?: No      01/22/2024   10:01 AM  Depression screen PHQ 2/9  Decreased Interest 2  Down, Depressed, Hopeless 1  PHQ - 2 Score 3  Altered sleeping 2  Tired, decreased energy 3  Change in appetite 0  Feeling bad or failure about yourself  0  Trouble concentrating 2  Moving slowly or fidgety/restless  0  Suicidal thoughts 0  PHQ-9 Score 10  Difficult doing work/chores Not difficult at all   PHQ2-9 Depression Screening   Little interest or  pleasure in doing things More than half the days  Feeling down, depressed, or hopeless Several days  PHQ-2 - Total Score 3  Trouble falling or staying asleep, or sleeping too much More than half the days  Feeling tired or having little energy Nearly every day  Poor appetite or overeating  Not at all  Feeling bad about yourself - or that you are a failure or have let yourself or your family down Not at all  Trouble concentrating on things, such as reading the newspaper or watching television More than half the days  Moving or speaking so slowly that other people could have noticed.  Or the opposite - being so fidgety or restless that you have been moving around a lot more than usual Not at all  Thoughts that you would be better off dead, or hurting yourself in some way Not at all  PHQ2-9 Total Score 10  If you checked off any problems, how difficult have these problems made it for you to do your work, take care of things at home, or get along with other people Not difficult at all  Depression Interventions/Treatment Medication     There were no vitals filed for this visit.  Medications Reviewed Today     Reviewed by Bertrum Rosina HERO, RN (Registered Nurse) on 01/22/24 at [phone]  Med List Status: <None>   Medication Order Taking? Sig Documenting Provider Last Dose Status Informant  albuterol  (VENTOLIN  HFA) 108 (90 Base) MCG/ACT inhaler [id number]  Inhale 1-2 puffs into the lungs every 6 (six) hours as needed for wheezing or shortness of breath. Severa Rock HERO, FNP  Active   ARIPiprazole  (ABILIFY ) 10 MG tablet [id number]  Take 10 mg by mouth every morning. [provider]  Active   Artificial Saliva (BIOTENE DRY MOUTH MOISTURIZING) SOLN [id number]  Use as directed 5 mLs in the mouth or throat in the morning and at bedtime.  Patient not taking: Reported on 01/09/2024   Severa Rock HERO, FNP  Active   aspirin EC 81 MG tablet [id number]  Take 81 mg by mouth daily. Swallow whole.  Patient not  taking: Reported on 01/22/2024   [provider]  Active   atorvastatin  (LIPITOR) 80 MG tablet [id number]  Take 1 tablet (80 mg total) by mouth daily. Barbaraann Darryle Ned, MD  Active   cetirizine  (ZYRTEC ) 10 MG tablet [id number]  Take 1 tablet (10 mg total) by mouth daily. Severa Rock HERO, FNP  Active   cholecalciferol  (VITAMIN D ) 25 MCG tablet [id number]  Take 1 tablet (1,000 Units total) by mouth daily. Jadine Toribio SQUIBB, MD  Active Self, Pharmacy Records  Continuous Glucose Receiver (FREESTYLE LIBRE 3 READER) [place] [id number]  with compatible Freestyle Libre 3 sensor to monitor glucose continuously. DX: E11.65 Severa Rock HERO, FNP  Active   Continuous Glucose Sensor (FREESTYLE LIBRE 3 PLUS SENSOR) MISC [id number]  Change sensor every 15 days. DX: E11.65 Severa Rock HERO, FNP  Active   diazepam  (VALIUM ) 5 MG tablet [id number]  Take 5 mg by mouth 2 (two) times daily. [provider]  Active   DULoxetine  (CYMBALTA ) 60 MG capsule [id number]  TAKE (1) CAPSULE BY MOUTH EVERY DAY.  Patient not taking: Reported on 01/09/2024   Onita Duos, MD  Active   estrogens , conjugated, (PREMARIN ) 0.3 MG tablet [id number]  TAKE (1)  TABLET BY MOUTH ONCE DAILY. Severa Rock HERO, FNP  Active   ezetimibe  (ZETIA ) 10 MG tablet [id number]  TAKE (1) TABLET BY MOUTH ONCE DAILY. Severa Rock HERO, FNP  Active   fluorometholone (FML) 0.1 % ophthalmic suspension [id number]  SMARTSIG:In Eye(s) [provider]  Active   fluticasone  (FLONASE ) 50 MCG/ACT nasal spray [id number]  Place 2 sprays into both nostrils daily. Joesph Annabella HERO, FNP  Active   hyoscyamine  (LEVSIN  SL) 0.125 MG SL tablet [id number]  Place 1 tablet (0.125 mg total) under the tongue every 8 (eight) hours as needed (abdominal pain).  Patient not taking: Reported on 01/09/2024   Castaneda Mayorga, Toribio, MD  Active   insulin  lispro (HUMALOG  [facility]) 100 UNIT/ML KwikPen [id number]  Use 5 units 2 times daily with breakfast and dinner when blood sugar is >200 on  steroids Rakes, Linda M, FNP  Active   Insulin  Pen Needle (PIP PEN NEEDLES 32G X ) 32G X 4 MM MISC [id number]  Use to inject insulin  2 times daily as needed. DX E11.65 Severa Rock HERO, FNP  Active   lamoTRIgine  (LAMICTAL ) 200 MG tablet [id number]  Take 200 mg by mouth at bedtime. For mood disorder. [provider]  Active Self, Pharmacy Records  lansoprazole  (PREVACID ) 30 MG capsule [id number]  TAKE (1) CAPSULE BY MOUTH ONCE DAILY.  Patient not taking: Reported on 01/09/2024   Severa Rock HERO, FNP  Active   lisinopril  (ZESTRIL ) 5 MG tablet [id number]  Take 1 tablet (5 mg total) by mouth daily. Severa Rock HERO, FNP  Active   meclizine  (ANTIVERT ) 25 MG tablet [id number]  Take 2 tablets (50 mg total) by mouth daily as needed for dizziness. Joesph Annabella HERO, FNP  Active   montelukast  (SINGULAIR ) 10 MG tablet [id number]  TAKE (1) TABLET BY MOUTH AT BEDTIME. Hope Almarie ORN, NP  Active   MYRBETRIQ  50 MG TB24 tablet [id number]  Take 1 tablet (50 mg total) by mouth daily. Severa Rock HERO, FNP  Active   omeprazole  (PRILOSEC) 40 MG capsule [id number]  Take 1 capsule (40 mg total) by mouth daily. Castaneda Mayorga, Toribio, MD  Active   [facility] Lancets 30G [place] [id number]  Use to test blood sugar twice daily. DX: E11.9 Merlynn Niki FALCON, FNP  Active Self, Pharmacy Records  oxyCODONE  (ROXICODONE ) 15 MG immediate release tablet [id number]  Take 1 tablet (15 mg total) by mouth every 6 (six) hours as needed for pain. Maree, Pratik D, DO  Active   pregabalin  (LYRICA ) 100 MG capsule [id number]  Take 2 capsules by mouth 3 (three) times daily. [provider]  Active   promethazine  (PHENERGAN ) 12.5 MG tablet [id number]  Take 1 tablet (12.5 mg total) by mouth every 8 (eight) hours as needed for nausea or vomiting. Severa Rock HERO, FNP  Active   tirzepatide  (MOUNJARO ) 15 MG/0.5ML Pen [id number]  Inject 15 mg into the skin once a week. DX: E11.9 Severa Rock HERO, FNP  Active   triamcinolone  cream (KENALOG ) 0.1 %  [id number]  Apply 1 Application topically 2 (two) times daily.  Patient not taking: Reported on 01/09/2024   Severa Rock HERO, FNP  Active   umeclidinium-vilanterol (ANORO ELLIPTA ) 62.5-25 MCG/ACT AEPB [id number]  Inhale 1 puff into the lungs daily. Hope Almarie ORN, NP  Active   valACYclovir  (VALTREX ) 1000 MG tablet [id number]  Take 1 tablet (1,000 mg total) by mouth daily. Please put in dispill Severa Rock HERO, FNP  Active   vitamin B-12 1000 MCG tablet [id number]  Take  1 tablet (1,000 mcg total) by mouth daily. Jadine Toribio SQUIBB, MD  Active Self, Pharmacy Records           Med Note WORLEY, STEPHANE GORMAN [place] Nov 21, 2021  3:25 PM) Pt has not started yet  VRAYLAR 3 MG capsule [id number]  Take 3 mg by mouth at bedtime. [provider]  Active   Med List Note Buena Rock HERO, [place] 05/22/22 1546): Not Rx'd by PCP: sertraline, valium , lamotrigine , and oxycodone , lyrica - [facility] 12/02/19.            Recommendation:   Continue Current Plan of Care  Follow Up Plan:   Telephone follow-up in 1 month  Rosina Forte, BSN RN [facility], [facility] RN Care Manager Direct Dial : [phone]  Fax: [phone]

## 2024-01-22 NOTE — Addendum Note (Signed)
 Addended by: Zayyan Mullen D on: 01/22/2024 04:21 PM   Modules accepted: Level of Service

## 2024-01-22 NOTE — Patient Outreach (Signed)
 Complex Care Management   Visit Note  01/22/2024  Name:  Cynthia Deleon MRN: [id number] DOB: 1955/12/03  Situation: Referral received for Complex Care Management related to SDOH Barriers:  Transportation Wheelchair I obtained verbal consent from Patient.  Visit completed with patient  on the phone  Background:   Past Medical History:  Diagnosis Date   Aortic atherosclerosis (HCC) 10/31/2021   Arthritis    hands and knees   Bipolar 1 disorder (HCC)    Borderline diabetic    Chronic back pain    Chronic neck pain    Complication of anesthesia    high anxiety-does not want to be alone   COPD (chronic obstructive pulmonary disease) (HCC)    Current use of estrogen therapy 01/12/2014   Depression    Diabetes mellitus without complication (HCC)    Diplopia    Family history of adverse reaction to anesthesia    sister gas in lungs   GERD (gastroesophageal reflux disease)    Headache    Hepatic steatosis 10/31/2021   Hot flashes 12/15/2013   Hypertension    IBS (irritable bowel syndrome)    Incontinence    Kidney stone    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HCC)    Osteopenia 02/06/2022   Panic attacks    Peptic ulcer    Peripheral neuropathy    Rosacea    Shingles    Sleep apnea    uses a cpap-with oxygen     Assessment:  Patient has not followed up with diabetic shoes and states it is not a pressing need at this time.  Patient reports she fell recently but declined medical attention. Patient communicated with provider for wheelchair orders but orders were sent to [facility] and they don't have the wheelchair. SW messaged provider to resubmit orders to [facility] in [place]. SW t/c [facility] and patient is eligible for 36 rides to any local destination. Patient is provided the number for Safe Ride [phone].   SDOH Interventions    Flowsheet Row Patient Outreach Telephone from 01/22/2024 in [place] POPULATION HEALTH DEPARTMENT Patient Outreach Telephone from 01/15/2024  in [place] POPULATION HEALTH DEPARTMENT Patient Outreach Telephone from 01/09/2024 in [place] POPULATION HEALTH DEPARTMENT Clinical Support from 12/26/2023 in [facility] Visit from 12/18/2023 in [facility] Visit from 08/12/2023 in [facility]  SDOH Interventions        Food Insecurity Interventions -- Intervention Not Indicated Intervention Not Indicated AMB Referral -- --  Housing Interventions -- Intervention Not Indicated Intervention Not Indicated Intervention Not Indicated -- --  Transportation Interventions -- Intervention Not Indicated, Patient Resources (Friends/Family), Other (Comment) -- Intervention Not Indicated -- --  Utilities Interventions -- Intervention Not Indicated Intervention Not Indicated Intervention Not Indicated -- --  Alcohol  Usage Interventions -- -- -- Intervention Not Indicated (Score <7) -- --  Depression Interventions/Treatment  Medication -- Medication, Referral to Psychiatry, Currently on Treatment, [employer] Referral Currently on Treatment Currently on Treatment Currently on Treatment  Financial Strain Interventions -- Intervention Not Indicated -- [employer] Provided, Other (Comment)  [referral placed] -- --  Physical Activity Interventions -- -- -- Intervention Not Indicated -- --  Stress Interventions -- -- -- Other (Comment)  [patient is states she is a worry wart she is also worried about everything that is going on with her health. sometimes feels unheard by her providers.] -- --  Social Connections Interventions -- -- Intervention Not  Indicated Intervention Not Indicated -- --  Health Literacy Interventions -- -- -- Patient declines to respond -- --     Recommendation:   SW messaged provider to submit orders for wheelchair to [facility] in [place].  Follow Up Plan:   Telephone follow up appointment date/time:   08/221/25 at 2pm,  Tillman Gardener, BSW [place]  [facility], [facility] Social Worker Direct Dial : [phone]  Fax: [phone] Website: [email or web address]

## 2024-01-22 NOTE — Patient Instructions (Signed)
 Visit Information  Thank you for taking time to visit with me today. Please don't hesitate to contact me if I can be of assistance to you before our next scheduled appointment.  Your next care management appointment is by telephone on 02-19-2024 at 9:00 am  Telephone follow-up in 1 month  Please call the care guide team at [phone] if you need to cancel, schedule, or reschedule an appointment.   Please call the Suicide and Crisis Lifeline: 988 call the [place]  National Suicide Prevention Lifeline: 6190126390 or TTY: 660-334-6406 TTY 8721579802) to talk to a trained counselor call 1-800-273-TALK (toll free, 24 hour hotline) call the [facility]: (870)172-2314 call 911 if you are experiencing a Mental Health or Behavioral Health Crisis or need someone to talk to.  Rosina Forte, BSN RN [facility], [facility] RN Care Manager Direct Dial : [phone]  Fax: [phone]

## 2024-01-22 NOTE — Patient Instructions (Signed)
 Visit Information  Thank you for taking time to visit with me today. Please don't hesitate to contact me if I can be of assistance to you before our next scheduled appointment.  Your next care management appointment is by telephone on 01/29/24 at 2pm  Please call the care guide team at [phone] if you need to cancel, schedule, or reschedule an appointment.   Please call 911 if you are experiencing a Mental Health or Behavioral Health Crisis or need someone to talk to.  Tillman Gardener, BSW [place]  [facility], [facility] Social Worker Direct Dial : [phone]  Fax: [phone] Website: [email or web address]

## 2024-01-25 DIAGNOSIS — R269 Unspecified abnormalities of gait and mobility: Secondary | ICD-10-CM | POA: Diagnosis not present

## 2024-01-25 DIAGNOSIS — R296 Repeated falls: Secondary | ICD-10-CM | POA: Diagnosis not present

## 2024-01-25 DIAGNOSIS — G4733 Obstructive sleep apnea (adult) (pediatric): Secondary | ICD-10-CM | POA: Diagnosis not present

## 2024-01-27 ENCOUNTER — Telehealth

## 2024-01-28 ENCOUNTER — Ambulatory Visit: Admitting: Family Medicine

## 2024-01-29 ENCOUNTER — Encounter: Payer: Self-pay | Admitting: Family Medicine

## 2024-01-29 ENCOUNTER — Other Ambulatory Visit: Payer: Self-pay

## 2024-01-29 NOTE — Progress Notes (Signed)
 Pharmacy Quality Measure Review  This patient is appearing on a report for being at risk of failing the adherence measure for hypertension (ACEi/ARB) medications this calendar year.   Medication: lisinopril  5 mg daily Last fill date: 01/21/24 for 30 day supply  Insurance report was not up to date. No action needed at this time.   Woodie Jock, PharmD PGY1 Pharmacy Resident

## 2024-01-29 NOTE — Patient Outreach (Signed)
 Complex Care Management   Visit Note  01/29/2024  Name:  Cynthia Deleon MRN: [id number] DOB: [birth year]  Situation: Referral received for Complex Care Management related to SDOH Barriers:  Wheelchair I obtained verbal consent from Patient.  Visit completed with Patient  on the phone  Background:   Past Medical History:  Diagnosis Date   Aortic atherosclerosis (HCC) 10/31/2021   Arthritis    hands and knees   Bipolar 1 disorder (HCC)    Borderline diabetic    Chronic back pain    Chronic neck pain    Complication of anesthesia    high anxiety-does not want to be alone   COPD (chronic obstructive pulmonary disease) (HCC)    Current use of estrogen therapy 01/12/2014   Depression    Diabetes mellitus without complication (HCC)    Diplopia    Family history of adverse reaction to anesthesia    sister gas in lungs   GERD (gastroesophageal reflux disease)    Headache    Hepatic steatosis 10/31/2021   Hot flashes 12/15/2013   Hypertension    IBS (irritable bowel syndrome)    Incontinence    Kidney stone    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HCC)    Osteopenia 02/06/2022   Panic attacks    Peptic ulcer    Peripheral neuropathy    Rosacea    Shingles    Sleep apnea    uses a cpap-with oxygen     Assessment:  Patient received wheelchair, but no instructions. SW t/c [facility] and staff will email instructions. Patient will have her friend assist with the instructions tomorrow when she goes to the doctor.    SDOH Interventions    Flowsheet Row Patient Outreach Telephone from 01/22/2024 in [place] POPULATION HEALTH DEPARTMENT Patient Outreach Telephone from 01/15/2024 in [place] POPULATION HEALTH DEPARTMENT Patient Outreach Telephone from 01/09/2024 in [place] POPULATION HEALTH DEPARTMENT Clinical Support from 12/26/2023 in [facility] Office Visit from 12/18/2023 in [facility] Visit from 08/12/2023  in [facility]  SDOH Interventions        Food Insecurity Interventions -- Intervention Not Indicated Intervention Not Indicated AMB Referral -- --  Housing Interventions -- Intervention Not Indicated Intervention Not Indicated Intervention Not Indicated -- --  Transportation Interventions -- Intervention Not Indicated, Patient Resources (Friends/Family), Other (Comment) -- Intervention Not Indicated -- --  Utilities Interventions -- Intervention Not Indicated Intervention Not Indicated Intervention Not Indicated -- --  Alcohol  Usage Interventions -- -- -- Intervention Not Indicated (Score <7) -- --  Depression Interventions/Treatment  Medication -- Medication, Referral to Psychiatry, Currently on Treatment, [employer] Referral Currently on Treatment Currently on Treatment Currently on Treatment  Financial Strain Interventions -- Intervention Not Indicated -- [employer] Provided, Other (Comment)  [referral placed] -- --  Physical Activity Interventions -- -- -- Intervention Not Indicated -- --  Stress Interventions -- -- -- Other (Comment)  [patient is states she is a worry wart she is also worried about everything that is going on with her health. sometimes feels unheard by her providers.] -- --  Social Connections Interventions -- -- Intervention Not Indicated Intervention Not Indicated -- --  Health Literacy Interventions -- -- -- Patient declines to respond -- --      Recommendation:   None  Follow Up Plan:   Patient has met all care management goals. Care Management case will be closed. Patient has been provided contact  information should new needs arise.   Tillman Gardener, BSW [place]  [facility], [facility] Social Worker Direct Dial : [phone]  Fax: [phone] Website: [email or web address]

## 2024-01-29 NOTE — Patient Instructions (Signed)

## 2024-01-30 ENCOUNTER — Encounter (HOSPITAL_BASED_OUTPATIENT_CLINIC_OR_DEPARTMENT_OTHER): Payer: Self-pay | Admitting: Primary Care

## 2024-01-30 ENCOUNTER — Ambulatory Visit (HOSPITAL_BASED_OUTPATIENT_CLINIC_OR_DEPARTMENT_OTHER): Admitting: Primary Care

## 2024-01-30 VITALS — BP 99/66 | HR 78 | Ht 69.0 in | Wt 169.6 lb

## 2024-01-30 DIAGNOSIS — G4733 Obstructive sleep apnea (adult) (pediatric): Secondary | ICD-10-CM | POA: Diagnosis not present

## 2024-01-30 DIAGNOSIS — G4734 Idiopathic sleep related nonobstructive alveolar hypoventilation: Secondary | ICD-10-CM | POA: Diagnosis not present

## 2024-01-30 DIAGNOSIS — J441 Chronic obstructive pulmonary disease with (acute) exacerbation: Secondary | ICD-10-CM

## 2024-01-30 DIAGNOSIS — J4489 Other specified chronic obstructive pulmonary disease: Secondary | ICD-10-CM

## 2024-01-30 MED ORDER — UMECLIDINIUM-VILANTEROL 62.5-25 MCG/ACT IN AEPB: 1.0000 | INHALATION_SPRAY | Freq: Every day | RESPIRATORY_TRACT | 11 refills | Status: AC

## 2024-01-30 MED ORDER — ALBUTEROL SULFATE HFA 108 (90 BASE) MCG/ACT IN AERS: 1.0000 | INHALATION_SPRAY | Freq: Four times a day (QID) | RESPIRATORY_TRACT | 3 refills | Status: AC | PRN

## 2024-01-30 NOTE — Progress Notes (Signed)
 @Patient  ID: Cynthia Deleon, female    DOB: [birth year], 68 y.o.   MRN: [id number]  No chief complaint on file.   Referring provider: Severa Rock HERO, FNP  HPI: 68 year old female, former smoker.  Past medical history significant for hypertension, aortic arthrosclerosis, COPD, OSA, seasonal allergic rhinitis, GERD, hepatic steatosis, type 2 diabetes, osteopenia, bipolar 1 disorder, PTSD, history of falls, polypharmacy.  Former patient of Dr. Shellia.  Previous [facility] pulmonary encounter: 06/30/2023 Discussed the use of AI scribe software for clinical note transcription with the patient, who gave verbal consent to proceed.  History of Present Illness   The patient, with a history of asthma, emphysema, and sleep apnea, reports cessation of smoking approximately a year and a half ago. She has been managing her respiratory conditions with a rescue inhaler as needed.  It was noted during her last office visit that she was changed from Trelegy to Anoro.  Patient is not currently taking Anoro as she thought there was a steroid in it and she has a history of thrush. She expresses that her breathing could be better. She also reports daily use of Singulair  or montelukast  for allergies and frequent use of her albuterol  rescue inhaler, approximately three times a day.  The patient has been experiencing frontal headaches, which she attributes to sinus issues. She describes the pain as constant and located across the eyes and forehead. Bright lights exacerbate the discomfort. She also reports a sensation of congestion and a runny nose, she does report getting up yellow mucus intermittently from her sinuses.  She has tried Flonase  and Tylenol  for relief, but these have not been effective.  Regarding her sleep apnea, the patient uses a CPAP machine and wears oxygen  at night. She reports feeling better after increasing her oxygen  to two liters.   The patient also has a history of tobacco use and is enrolled in a  lung cancer screening program. Last scan is from 2023, we will have lung cancer screening team contact [facility] to see if she had her annual 2024 scan with them (not linked with Epic).     Airview download 05/28/23-06/25/22 29/30 days used; 90% > 4 hours Average usage 7 hours 50 mins Pressure 5-10cm h20 (8.2cm h20-95%) Airleaks 14.3L/min (95%) AHI 1.4    01/30/2024- Interim hx  Discussed the use of AI scribe software for clinical note transcription with the patient, who gave verbal consent to proceed.  History of Present Illness Cynthia Deleon is a 68 year old female with mild sleep apnea and COPD who presents for a six-month follow-up.  She has mild sleep apnea and uses a CPAP machine with supplemental oxygen  at night. A sleep study on March 19, 2022, determined an optimal CPAP pressure of 7 with 1 liter of supplemental oxygen . She uses her CPAP 94% of the time, averaging 5 hours and 49 minutes per night. She cannot sleep without the CPAP, except during episodes of mania related to her bipolar disorder.  She has COPD and uses the Anoro inhaler. She occasionally uses her rescue inhaler, albuterol , but tries to limit its use. She carries it for emergencies. She is a former smoker, having quit two years ago, and is following up with lung cancer screening. She is overdue for LDCT scan.   She has a history of sinus issues, which have resolved. She uses Flonase  nasal spray daily to manage her symptoms.  Airview compliance report 11/01/23-01/29/24 Usage days 85/90 days Average usage days used 6 hours 9  mins Pressure 5-10cm h20 (9cm h20-95%) Airleaks 16.3L/min (95%) AHI 3.9     Allergies  Allergen Reactions   Divalproex Sodium Other (See Comments)    Hallucinations   Other reaction(s): Confusion  Hallucinations     Other reaction(s): Confusion    Hallucinations   Other Anaphylaxis, Other (See Comments), Nausea And Vomiting and Nausea Only   Pseudoeph-Hydrocodone -Gg Nausea Only    Sulfa Antibiotics Anaphylaxis, Nausea Only, Other (See Comments) and Swelling    Swelling of tongue.   Valproic Acid Other (See Comments)   Varenicline Other (See Comments)    Altered mental status-Per patient was put on allergy list by Dr Todd Cliche bu patient staes she is not allergic to this and is currently taking it.   Varenicline Tartrate Other (See Comments)    Other reaction(s): Confusion   Codeine Nausea And Vomiting and Other (See Comments)    Other reaction(s): vomiting   Hydrocodone  Nausea And Vomiting and Other (See Comments)   Acetaminophen  Nausea Only   Ambien  [Zolpidem ] Other (See Comments)    Causes sleep walking   Clavulanic Acid Diarrhea   Elemental Sulfur Other (See Comments)    Swelling of tongue   Hydrocod Poli-Chlorphe Poli Er Other (See Comments)    Other reaction(s): Skin itches    Macrobid  [Nitrofurantoin ] Nausea And Vomiting    Dizziness, nausea, vomiting   Paxil [Paroxetine Hcl] Other (See Comments)    Causes ringing in the ears.    Prednisone  Other (See Comments)    Other reaction(s): Unknown    Sulfur Other (See Comments)    sulfur   Amoxicillin  Rash    Has patient had a PCN reaction causing immediate rash, facial/tongue/throat swelling, SOB or lightheadedness with hypotension: No Has patient had a PCN reaction causing severe rash involving mucus membranes or skin necrosis: No Has patient had a PCN reaction that required hospitalization: No Has patient had a PCN reaction occurring within the last 10 years: Yes If all of the above answers are NO, then may proceed with Cephalosporin use.    Farxiga  [Dapagliflozin ]     RECURRENT YEAST/UTI   Metformin  And Related Diarrhea   Tape Rash    Immunization History  Administered Date(s) Administered   DTaP 02/23/1957, 03/23/1957, 04/27/1957, 10/11/1959   Fluad Quad(high Dose 65+) 03/08/2021, 03/07/2022   Fluzone Influenza virus vaccine,trivalent (IIV3), split virus 03/26/2016, 02/17/2019   Hep A /  Hep B 05/23/2023, 07/12/2023, 12/18/2023   IPV 10/26/1957, 03/14/1961, 02/13/1962, 11/22/1965   Influenza Inj Mdck Quad With Preservative 03/11/2018   Influenza Split 05/08/2004, 05/09/2005   Influenza, High Dose Seasonal PF 05/26/2023   Influenza,inj,Quad PF,6+ Mos 02/18/2017, 02/17/2019   Influenza,inj,quad, With Preservative 04/03/2015, 04/10/2018, 02/17/2019   Influenza-Unspecified 03/10/2020, 05/26/2023   MMR 10/23/2018   Moderna SARS-COV2 Booster Vaccination 06/07/2020   Moderna Sars-Covid-2 Vaccination 08/19/2019, 09/23/2019   PNEUMOCOCCAL CONJUGATE-20 03/29/2021   Pneumococcal Conjugate-13 03/24/2014, 10/01/2017   Pneumococcal Polysaccharide-23 04/29/2007   Respiratory Syncytial Virus Vaccine,Recomb Aduvanted(Arexvy) 07/25/2022   Smallpox 10/11/1959   Tdap 11/20/2017   Zoster Recombinant(Shingrix) 03/29/2021, 07/25/2022   Zoster, Live 04/16/2013    Past Medical History:  Diagnosis Date   Aortic atherosclerosis (HCC) 10/31/2021   Arthritis    hands and knees   Bipolar 1 disorder (HCC)    Borderline diabetic    Chronic back pain    Chronic neck pain    Complication of anesthesia    high anxiety-does not want to be alone   COPD (chronic obstructive pulmonary disease) (HCC)  Current use of estrogen therapy 01/12/2014   Depression    Diabetes mellitus without complication (HCC)    Diplopia    Family history of adverse reaction to anesthesia    sister gas in lungs   GERD (gastroesophageal reflux disease)    Headache    Hepatic steatosis 10/31/2021   Hot flashes 12/15/2013   Hypertension    IBS (irritable bowel syndrome)    Incontinence    Kidney stone    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HCC)    Osteopenia 02/06/2022   Panic attacks    Peptic ulcer    Peripheral neuropathy    Rosacea    Shingles    Sleep apnea    uses a cpap-with oxygen     Tobacco History: Social History   Tobacco Use  Smoking Status Former   Current packs/day: 0.25   Average  packs/day: 1 pack/day for 43.6 years (42.4 ttl pk-yrs)   Types: Cigarettes   Start date: 06/11/1975   Quit date: 06/10/2017  Smokeless Tobacco Never   Counseling given: Not Answered   Outpatient Medications Prior to Visit  Medication Sig Dispense Refill   albuterol  (VENTOLIN  HFA) 108 (90 Base) MCG/ACT inhaler Inhale 1-2 puffs into the lungs every 6 (six) hours as needed for wheezing or shortness of breath. 8.5 g 2   ARIPiprazole  (ABILIFY ) 10 MG tablet Take 10 mg by mouth every morning.     Artificial Saliva (BIOTENE DRY MOUTH MOISTURIZING) SOLN Use as directed 5 mLs in the mouth or throat in the morning and at bedtime. 44.3 mL 0   aspirin EC 81 MG tablet Take 81 mg by mouth daily. Swallow whole.     atorvastatin  (LIPITOR) 80 MG tablet Take 1 tablet (80 mg total) by mouth daily. 90 tablet 0   cetirizine  (ZYRTEC ) 10 MG tablet Take 1 tablet (10 mg total) by mouth daily. 90 tablet 1   cholecalciferol  (VITAMIN D ) 25 MCG tablet Take 1 tablet (1,000 Units total) by mouth daily.     Continuous Glucose Receiver (FREESTYLE LIBRE 3 READER) DEVI with compatible Freestyle Libre 3 sensor to monitor glucose continuously. DX: E11.65 1 each 1   Continuous Glucose Sensor (FREESTYLE LIBRE 3 PLUS SENSOR) MISC Change sensor every 15 days. DX: E11.65 2 each 5   diazepam  (VALIUM ) 5 MG tablet Take 5 mg by mouth 2 (two) times daily.     DULoxetine  (CYMBALTA ) 60 MG capsule TAKE (1) CAPSULE BY MOUTH EVERY DAY. 30 capsule 0   estrogens , conjugated, (PREMARIN ) 0.3 MG tablet TAKE (1) TABLET BY MOUTH ONCE DAILY. 30 tablet 10   ezetimibe  (ZETIA ) 10 MG tablet TAKE (1) TABLET BY MOUTH ONCE DAILY. 30 tablet 0   fluorometholone (FML) 0.1 % ophthalmic suspension SMARTSIG:In Eye(s)     fluticasone  (FLONASE ) 50 MCG/ACT nasal spray Place 2 sprays into both nostrils daily. 16 g 0   hyoscyamine  (LEVSIN  SL) 0.125 MG SL tablet Place 1 tablet (0.125 mg total) under the tongue every 8 (eight) hours as needed (abdominal pain). 90 tablet 2    insulin  lispro (HUMALOG  KWIKPEN) 100 UNIT/ML KwikPen Use 5 units 2 times daily with breakfast and dinner when blood sugar is >200 on steroids 15 mL 11   Insulin  Pen Needle (PIP PEN NEEDLES 32G X ) 32G X 4 MM MISC Use to inject insulin  2 times daily as needed. DX E11.65 100 each 11   lamoTRIgine  (LAMICTAL ) 200 MG tablet Take 200 mg by mouth at bedtime. For mood disorder.     lansoprazole  (PREVACID )  30 MG capsule TAKE (1) CAPSULE BY MOUTH ONCE DAILY. 30 capsule 6   lisinopril  (ZESTRIL ) 5 MG tablet Take 1 tablet (5 mg total) by mouth daily. 90 tablet 1   meclizine  (ANTIVERT ) 25 MG tablet Take 2 tablets (50 mg total) by mouth daily as needed for dizziness. 60 tablet 2   montelukast  (SINGULAIR ) 10 MG tablet TAKE (1) TABLET BY MOUTH AT BEDTIME. 90 tablet 3   MYRBETRIQ  50 MG TB24 tablet Take 1 tablet (50 mg total) by mouth daily. 180 tablet 1   omeprazole  (PRILOSEC) 40 MG capsule Take 1 capsule (40 mg total) by mouth daily. 90 capsule 3   OneTouch Delica Lancets 30G MISC Use to test blood sugar twice daily. DX: E11.9 100 each 3   oxyCODONE  (ROXICODONE ) 15 MG immediate release tablet Take 1 tablet (15 mg total) by mouth every 6 (six) hours as needed for pain. 10 tablet 0   pregabalin  (LYRICA ) 100 MG capsule Take 2 capsules by mouth 3 (three) times daily.     promethazine  (PHENERGAN ) 12.5 MG tablet Take 1 tablet (12.5 mg total) by mouth every 8 (eight) hours as needed for nausea or vomiting. 20 tablet 3   tirzepatide  (MOUNJARO ) 15 MG/0.5ML Pen Inject 15 mg into the skin once a week. DX: E11.9 6 mL 0   triamcinolone  cream (KENALOG ) 0.1 % Apply 1 Application topically 2 (two) times daily. 30 g 0   umeclidinium-vilanterol (ANORO ELLIPTA ) 62.5-25 MCG/ACT AEPB Inhale 1 puff into the lungs daily. 60 each 5   valACYclovir  (VALTREX ) 1000 MG tablet Take 1 tablet (1,000 mg total) by mouth daily. Please put in dispill 30 tablet 12   vitamin B-12 1000 MCG tablet Take 1 tablet (1,000 mcg total) by mouth daily.      VRAYLAR 3 MG capsule Take 3 mg by mouth at bedtime.     No facility-administered medications prior to visit.    Review of Systems  Review of Systems  Respiratory: Negative.       Physical Exam  BP 99/66   Pulse 78   Ht 5' 9 (1.753 m)   Wt 169 lb 9.6 oz (76.9 kg)   SpO2 99%   BMI 25.05 kg/m  Physical Exam Constitutional:      Appearance: Normal appearance.  Cardiovascular:     Rate and Rhythm: Normal rate and regular rhythm.  Pulmonary:     Effort: Pulmonary effort is normal.     Breath sounds: Normal breath sounds. No wheezing, rhonchi or rales.  Musculoskeletal:        General: Normal range of motion.  Skin:    General: Skin is warm and dry.  Neurological:     General: No focal deficit present.     Mental Status: She is alert and oriented to person, place, and time. Mental status is at baseline.  Psychiatric:        Mood and Affect: Mood normal.        Behavior: Behavior normal.        Thought Content: Thought content normal.        Judgment: Judgment normal.      Lab Results:  CBC    Component Value Date/Time   WBC 6.6 12/18/2023 1550   WBC 14.9 (H) 10/19/2023 1242   RBC 4.46 12/18/2023 1550   RBC 4.47 10/19/2023 1242   HGB 14.3 12/18/2023 1550   HCT 43.4 12/18/2023 1550   PLT 206 12/18/2023 1550   MCV 97 12/18/2023 1550   MCH 32.1  12/18/2023 1550   MCH 34.0 10/19/2023 1242   MCHC 32.9 12/18/2023 1550   MCHC 34.5 10/19/2023 1242   RDW 12.7 12/18/2023 1550   LYMPHSABS 2.3 12/18/2023 1550   MONOABS 0.7 10/19/2023 1242   EOSABS 0.2 12/18/2023 1550   BASOSABS 0.1 12/18/2023 1550    BMET    Component Value Date/Time   NA 135 10/19/2023 1206   NA 141 08/12/2023 1411   K 4.8 10/19/2023 1206   CL 103 10/19/2023 1206   CO2 21 (L) 10/19/2023 1206   GLUCOSE 343 (H) 10/19/2023 1206   BUN 17 10/19/2023 1206   BUN 12 08/12/2023 1411   CREATININE 0.88 10/19/2023 1206   CALCIUM  9.7 10/19/2023 1206   GFRNONAA >60 10/19/2023 1206   GFRAA 102  06/08/2020 0847    BNP No results found for: BNP  ProBNP No results found for: PROBNP  Imaging: No results found.   Assessment & Plan:   1. COPD with acute exacerbation (HCC) - albuterol  (VENTOLIN  HFA) 108 (90 Base) MCG/ACT inhaler; Inhale 1-2 puffs into the lungs every 6 (six) hours as needed for wheezing or shortness of breath.  Dispense: 8.5 g; Refill: 3  2. COPD with asthma (HCC) (Primary) - Ambulatory Referral for DME  3. Nocturnal hypoxemia - Ambulatory Referral for DME  4. OSA (obstructive sleep apnea) - Ambulatory Referral for DME   Assessment and Plan Assessment & Plan Obstructive sleep apnea with nocturnal hypoxemia Mild obstructive sleep apnea with nocturnal hypoxemia. Original sleep study in November 2022 showed lowest oxygen  saturation at 84%. Titration study in October 2023 determined optimal CPAP pressure at 7 cm H2O with 1 liter of supplemental oxygen . Current CPAP usage is 94% of nights, with an average of 5 hours and 49 minutes per night. Apnea score is 3.9. CPAP settings are on auto, ranging from 5 to 10 cm H2O. Oxygen  is blended into CPAP for nocturnal use. She reports not using the CPAP during manic episodes related to bipolar disorder but otherwise finds it essential for sleep. - Continue CPAP use nightly 4-6 hours or longer  - Renew CPAP supplies with [employer]. - Continue 1 liter of supplemental oxygen  blended into CPAP for nocturnal hypoxemia.  Chronic obstructive pulmonary disease (COPD) COPD is well-managed on Anoro inhaler. She uses albuterol  rescue inhaler infrequently and carries it for emergencies. Former smoker, abstinent for two years. - Send prescription refills for Anoro and albuterol  inhalers.  Allergic rhinitis Allergic rhinitis is well-controlled with daily use of Flonase  nasal spray. No current sinus issues reported.  Lung cancer screening, planned Former smoker with a history of lung cancer screening. Last CT scan was  in 2020. No recent CT scan performed. - Coordinate with lung cancer screening team to schedule a CT scan.  Follow-Up - Follow up in six months.    Almarie LELON Ferrari, NP 01/30/2024

## 2024-01-30 NOTE — Patient Instructions (Signed)
  VISIT SUMMARY: Today, you came in for your six-month follow-up appointment. We reviewed your conditions, including sleep apnea, COPD, and allergic rhinitis, and discussed your current treatments and any necessary adjustments. We also planned for your upcoming lung cancer screening.  YOUR PLAN: -OBSTRUCTIVE SLEEP APNEA WITH NOCTURNAL HYPOXEMIA: Obstructive sleep apnea is a condition where your breathing stops and starts during sleep, often causing low oxygen  levels at night. You should continue using your CPAP machine with 1 liter of supplemental oxygen  at night. We will renew your CPAP supplies with [employer].  -CHRONIC OBSTRUCTIVE PULMONARY DISEASE (COPD): COPD is a chronic lung disease that makes it hard to breathe. Your condition is well-managed with the Anoro inhaler, and you should continue using it as prescribed. You should also carry your albuterol  rescue inhaler for emergencies. We will send prescription refills for both inhalers.  -ALLERGIC RHINITIS: Allergic rhinitis is an allergic reaction that causes sneezing, congestion, and a runny nose. Your symptoms are well-controlled with daily use of Flonase  nasal spray. Continue using it as directed.  -LUNG CANCER SCREENING, PLANNED: As a former smoker, regular lung cancer screening is important. We will coordinate with the lung cancer screening team to schedule a CT scan since your last one was in 2020.  INSTRUCTIONS: Please follow up in six months for your next appointment.

## 2024-01-30 NOTE — Telephone Encounter (Addendum)
 Saw in clinic today, can we reach out to her to schedule overdue LDCT  Last LDCT in chart was done in 2023. This is a  patient, last scan was possibly done at [facility]

## 2024-01-31 DIAGNOSIS — G4733 Obstructive sleep apnea (adult) (pediatric): Secondary | ICD-10-CM | POA: Diagnosis not present

## 2024-02-02 ENCOUNTER — Other Ambulatory Visit: Payer: Self-pay

## 2024-02-02 ENCOUNTER — Encounter: Payer: Self-pay | Admitting: Acute Care

## 2024-02-02 DIAGNOSIS — Z122 Encounter for screening for malignant neoplasm of respiratory organs: Secondary | ICD-10-CM

## 2024-02-02 DIAGNOSIS — Z87891 Personal history of nicotine dependence: Secondary | ICD-10-CM

## 2024-02-04 DIAGNOSIS — M47896 Other spondylosis, lumbar region: Secondary | ICD-10-CM | POA: Diagnosis not present

## 2024-02-04 DIAGNOSIS — M51361 Other intervertebral disc degeneration, lumbar region with lower extremity pain only: Secondary | ICD-10-CM | POA: Diagnosis not present

## 2024-02-04 DIAGNOSIS — M5416 Radiculopathy, lumbar region: Secondary | ICD-10-CM | POA: Diagnosis not present

## 2024-02-04 DIAGNOSIS — M47816 Spondylosis without myelopathy or radiculopathy, lumbar region: Secondary | ICD-10-CM | POA: Diagnosis not present

## 2024-02-04 DIAGNOSIS — M5412 Radiculopathy, cervical region: Secondary | ICD-10-CM | POA: Diagnosis not present

## 2024-02-05 ENCOUNTER — Ambulatory Visit: Admitting: Family Medicine

## 2024-02-05 DIAGNOSIS — H04123 Dry eye syndrome of bilateral lacrimal glands: Secondary | ICD-10-CM | POA: Diagnosis not present

## 2024-02-05 DIAGNOSIS — H5203 Hypermetropia, bilateral: Secondary | ICD-10-CM | POA: Diagnosis not present

## 2024-02-05 DIAGNOSIS — H25043 Posterior subcapsular polar age-related cataract, bilateral: Secondary | ICD-10-CM | POA: Diagnosis not present

## 2024-02-05 DIAGNOSIS — E119 Type 2 diabetes mellitus without complications: Secondary | ICD-10-CM | POA: Diagnosis not present

## 2024-02-05 DIAGNOSIS — H25011 Cortical age-related cataract, right eye: Secondary | ICD-10-CM | POA: Diagnosis not present

## 2024-02-05 DIAGNOSIS — H0100A Unspecified blepharitis right eye, upper and lower eyelids: Secondary | ICD-10-CM | POA: Diagnosis not present

## 2024-02-05 DIAGNOSIS — H524 Presbyopia: Secondary | ICD-10-CM | POA: Diagnosis not present

## 2024-02-05 DIAGNOSIS — E1169 Type 2 diabetes mellitus with other specified complication: Secondary | ICD-10-CM | POA: Diagnosis not present

## 2024-02-05 DIAGNOSIS — H2513 Age-related nuclear cataract, bilateral: Secondary | ICD-10-CM | POA: Diagnosis not present

## 2024-02-05 DIAGNOSIS — H43813 Vitreous degeneration, bilateral: Secondary | ICD-10-CM | POA: Diagnosis not present

## 2024-02-05 LAB — HM DIABETES EYE EXAM

## 2024-02-10 ENCOUNTER — Other Ambulatory Visit: Payer: Self-pay | Admitting: Family Medicine

## 2024-02-11 ENCOUNTER — Other Ambulatory Visit

## 2024-02-12 ENCOUNTER — Ambulatory Visit: Payer: Self-pay

## 2024-02-12 ENCOUNTER — Telehealth: Payer: Self-pay | Admitting: Family Medicine

## 2024-02-12 NOTE — Telephone Encounter (Signed)
 Scheduled for first available appt next day. Patient to call back with any new or worsening symptoms including increased back pain, nausea, vomiting, inability to urinate or fever.   FYI Only or Action Required?: FYI only for provider.  Patient was last seen in primary care on 12/18/2023 by Severa Rock HERO, FNP.  Called Nurse Triage reporting Dysuria.  Symptoms began yesterday.  Interventions attempted: Rest, hydration, or home remedies.  Symptoms are: unchanged.  Triage Disposition: See Physician Within 24 Hours  Patient/caregiver understands and will follow disposition?: Yes  Reason for Disposition  Bad or foul-smelling urine  Side (flank) or lower back pain present  Answer Assessment - Initial Assessment Questions Pt is a diabetic, states BS have been at her baseline.   1. SYMPTOM: What's the main symptom you're concerned about? (e.g., frequency, incontinence)     Malodorous urine, lower back pain but can't differentiate from her chronic pain. States back pain improved with movement.  2. ONSET:      02/11/24  5. OTHER SYMPTOMS:      Denies  Protocols used: Urinary Symptoms-A-AH

## 2024-02-12 NOTE — Therapy (Incomplete)
 OUTPATIENT PHYSICAL THERAPY VESTIBULAR EVALUATION     Patient Name: Cynthia Deleon MRN: [id number] DOB:21-Feb-1956, 68 y.o., female Today's Date: 02/12/2024  END OF SESSION:   Past Medical History:  Diagnosis Date   Aortic atherosclerosis (HCC) 10/31/2021   Arthritis    hands and knees   Bipolar 1 disorder (HCC)    Borderline diabetic    Chronic back pain    Chronic neck pain    Complication of anesthesia    high anxiety-does not want to be alone   COPD (chronic obstructive pulmonary disease) (HCC)    Current use of estrogen therapy 01/12/2014   Depression    Diabetes mellitus without complication (HCC)    Diplopia    Family history of adverse reaction to anesthesia    sister gas in lungs   GERD (gastroesophageal reflux disease)    Headache    Hepatic steatosis 10/31/2021   Hot flashes 12/15/2013   Hypertension    IBS (irritable bowel syndrome)    Incontinence    Kidney stone    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HCC)    Osteopenia 02/06/2022   Panic attacks    Peptic ulcer    Peripheral neuropathy    Rosacea    Shingles    Sleep apnea    uses a cpap-with oxygen    Past Surgical History:  Procedure Laterality Date   BIOPSY  04/27/2020   Procedure: BIOPSY;  Surgeon: Donnald Charleston, MD;  Location: [facility];  Service: Endoscopy;;   BUNIONECTOMY WITH HAMMERTOE RECONSTRUCTION Right 12/10/2012   Procedure: RIGHT FIRST METATARSAL CHEVRON BUNION CORRECTION,  2 AND 3 HAMMERTOE CORRECTION , RIGHT 3 AND 4 TOE NAIL EXCISION ;  Surgeon: Norleen Armor, MD;  Location: [facility];  Service: Orthopedics;  Laterality: Right;   COLONOSCOPY WITH PROPOFOL  N/A 04/27/2020   Procedure: COLONOSCOPY WITH PROPOFOL ;  Surgeon: Donnald Charleston, MD;  Location: [facility];  Service: Endoscopy;  Laterality: N/A;   COLONOSCOPY WITH PROPOFOL  N/A 06/17/2023   Procedure: COLONOSCOPY WITH PROPOFOL ;  Surgeon: Eartha Angelia Sieving, MD;  Location: [facility];  Service:  Gastroenterology;  Laterality: N/A;  2:30PM;ASA 3   FOOT ARTHRODESIS  2000   both feet   LIPOSUCTION  03/2018   POLYPECTOMY  06/17/2023   Procedure: POLYPECTOMY;  Surgeon: Eartha Angelia, Sieving, MD;  Location: [facility];  Service: Gastroenterology;;   RECTAL SURGERY     TONSILLECTOMY     TOTAL ABDOMINAL HYSTERECTOMY     Patient Active Problem List   Diagnosis Date Noted   Diabetes mellitus (HCC) 08/12/2023   Abdominal pain 06/17/2023   Colitis 05/05/2023   Diabetes mellitus treated with insulin  and oral medication (HCC) 11/22/2022   Long-term current use of injectable noninsulin antidiabetic medication 11/22/2022   S/P hysterectomy 10/28/2022   History of shingles 10/28/2022   Chronic allergic rhinitis 08/22/2022   Seasonal allergic rhinitis due to pollen 03/29/2022   Polypharmacy 03/29/2022   Hyperlipidemia associated with type 2 diabetes mellitus (HCC) 02/06/2022   Hx of adenomatous polyp of colon 12/25/2021   Genitourinary syndrome of menopause 12/07/2021   Osteopenia 10/31/2021   Aortic atherosclerosis (HCC) 10/31/2021   Hepatic steatosis 10/31/2021   Falls frequently 10/26/2021   Constipation 10/26/2021   Gait abnormality 05/21/2021   Lumbar radiculopathy 05/02/2021   Polyneuropathy due to type 2 diabetes mellitus (HCC) 10/10/2020   Seasonal allergies 10/10/2020   Stress incontinence of urine 06/20/2020   Diplopia 03/20/2020   GERD (gastroesophageal reflux disease)    Hypertension associated with  diabetes (HCC)    COPD without exacerbation (HCC)    Osteoarthritis of left knee 04/01/2019   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   Type 2 diabetes mellitus with hypoglycemia without coma, without long-term current use of insulin  (HCC) 07/06/2018   Bilateral temporomandibular joint pain 01/21/2018   OSA (obstructive sleep apnea) 01/21/2018   Vertigo 01/21/2018   DDD (degenerative disc disease), cervical 07/17/2017   Degeneration of lumbar intervertebral disc 07/17/2017   OCD  (obsessive compulsive disorder) 11/15/2015   Tobacco use disorder 11/15/2015   Current use of estrogen therapy 01/12/2014   PTSD (post-traumatic stress disorder) 08/03/2011   Bipolar I disorder, most recent episode mixed, severe with psychotic features (HCC)     PCP: *** REFERRING PROVIDER: ***  REFERRING DIAG: ***  THERAPY DIAG:  No diagnosis found.  ONSET DATE: ***  Rationale for Evaluation and Treatment: {HABREHAB:27488}  SUBJECTIVE:   SUBJECTIVE STATEMENT: *** Pt accompanied by: {accompnied:27141}  PERTINENT HISTORY: ***  PAIN:  Are you having pain? {OPRCPAIN:27236}  PRECAUTIONS: {Therapy precautions:24002}  RED FLAGS: {PT Red Flags:29287}   WEIGHT BEARING RESTRICTIONS: {Yes ***/No:24003}  FALLS: Has patient fallen in last 6 months? {fallsyesno:27318}  LIVING ENVIRONMENT: Lives with: {OPRC lives with:25569::lives with their family} Lives in: {Lives in:25570} Stairs: {opstairs:27293} Has following equipment at home: {Assistive devices:23999}  PLOF: {PLOF:24004}  PATIENT GOALS: ***  OBJECTIVE:  Note: Objective measures were completed at Evaluation unless otherwise noted.  DIAGNOSTIC FINDINGS: ***  COGNITION: Overall cognitive status: {cognition:24006}   SENSATION: {sensation:27233}  EDEMA:  {edema:24020}  MUSCLE TONE:  {LE tone:25568}  DTRs:  {DTR SITE:24025}  POSTURE:  {posture:25561}  Cervical ROM:    {AROM/PROM:27142} A/PROM (deg) eval  Flexion   Extension   Right lateral flexion   Left lateral flexion   Right rotation   Left rotation   (Blank rows = not tested)  STRENGTH: ***  LOWER EXTREMITY MMT:   MMT Right eval Left eval  Hip flexion    Hip abduction    Hip adduction    Hip internal rotation    Hip external rotation    Knee flexion    Knee extension    Ankle dorsiflexion    Ankle plantarflexion    Ankle inversion    Ankle eversion    (Blank rows = not tested)  BED MOBILITY:  {Bed  mobility:24027}  TRANSFERS: Assistive device utilized: {Assistive devices:23999}  Sit to stand: {Levels of assistance:24026} Stand to sit: {Levels of assistance:24026} Chair to chair: {Levels of assistance:24026} Floor: {Levels of assistance:24026}  RAMP: {Levels of assistance:24026}  CURB: {Levels of assistance:24026}  GAIT: Gait pattern: {gait characteristics:25376} Distance walked: *** Assistive device utilized: {Assistive devices:23999} Level of assistance: {Levels of assistance:24026} Comments: ***  FUNCTIONAL TESTS:  {Functional tests:24029}  PATIENT SURVEYS:  {rehab surveys:24030}  VESTIBULAR ASSESSMENT:  GENERAL OBSERVATION: ***   SYMPTOM BEHAVIOR:  Subjective history: ***  Non-Vestibular symptoms: {nonvestibular symptoms:25260}  Type of dizziness: {Type of Dizziness:25255}  Frequency: ***  Duration: ***  Aggravating factors: {Aggravating Factors:25258}  Relieving factors: {Relieving Factors:25259}  Progression of symptoms: {DESC; BETTER/WORSE:18575}  OCULOMOTOR EXAM:  Ocular Alignment: {Ocular Alignment:25262}  Ocular ROM: {RANGE OF MOTION:21649}  Spontaneous Nystagmus: {Spontaneous nystagmus:25263}  Gaze-Induced Nystagmus: {gaze-induced nystagmus:25264}  Smooth Pursuits: {smooth pursuit:25265}  Saccades: {saccades:25266}  Convergence/Divergence: *** cm   FRENZEL - FIXATION SUPRESSED:  Ocular Alignment: {Ocular Alignment:25262}  Spontaneous Nystagmus: {Spontaneous nystagmus:25263}  Gaze-Induced Nystagmus: {gaze-induced nystagmus:25264}  Horizontal head shaking - induced nystagmus: {head shaking induced nystagmus:25267}  Vertical head shaking - induced nystagmus: {head shaking induced nystagmus:25267}  Positional tests: {Positional tests:25271}  Pressure tests: {frenzel pressure tests:25268}  VESTIBULAR - OCULAR REFLEX:   Slow VOR: {slow VOR:25290}  VOR Cancellation: {vor cancellation:25291}  Head-Impulse Test: {head impulse test:25272}  Dynamic  Visual Acuity: {dynamic visual acuity:25273}   POSITIONAL TESTING: {Positional tests:25271}  MOTION SENSITIVITY:  Motion Sensitivity Quotient Intensity: 0 = none, 1 = Lightheaded, 2 = Mild, 3 = Moderate, 4 = Severe, 5 = Vomiting  Intensity  1. Sitting to supine   2. Supine to L side   3. Supine to R side   4. Supine to sitting   5. L Hallpike-Dix   6. Up from L    7. R Hallpike-Dix   8. Up from R    9. Sitting, head tipped to L knee   10. Head up from L knee   11. Sitting, head tipped to R knee   12. Head up from R knee   13. Sitting head turns x5   14.Sitting head nods x5   15. In stance, 180 turn to L    16. In stance, 180 turn to R     OTHOSTATICS: {Exam; orthostatics:31331}  FUNCTIONAL GAIT: {Functional tests:24029}                                                                                                                             TREATMENT DATE: ***   Canalith Repositioning:  {Canalith Repositioning:25283} Gaze Adaptation:  {gaze adaptation:25286} Habituation:  {habituation:25288} Other: ***  PATIENT EDUCATION: Education details: *** Person educated: {Person educated:25204} Education method: {Education Method:25205} Education comprehension: {Education Comprehension:25206}  HOME EXERCISE PROGRAM:  GOALS: Goals reviewed with patient? {yes/no:20286}  SHORT TERM GOALS: Target date: ***  *** Baseline: Goal status: {GOALSTATUS:25110}  2.  *** Baseline:  Goal status: {GOALSTATUS:25110}  3.  *** Baseline:  Goal status: {GOALSTATUS:25110}  4.  *** Baseline:  Goal status: {GOALSTATUS:25110}  5.  *** Baseline:  Goal status: {GOALSTATUS:25110}  6.  *** Baseline:  Goal status: {GOALSTATUS:25110}  LONG TERM GOALS: Target date: ***  *** Baseline:  Goal status: {GOALSTATUS:25110}  2.  *** Baseline:  Goal status: {GOALSTATUS:25110}  3.  *** Baseline:  Goal status: {GOALSTATUS:25110}  4.  *** Baseline:  Goal status:  {GOALSTATUS:25110}  5.  *** Baseline:  Goal status: {GOALSTATUS:25110}  6.  *** Baseline:  Goal status: {GOALSTATUS:25110}  ASSESSMENT:  CLINICAL IMPRESSION: Patient is a *** y.o. *** who was seen today for physical therapy evaluation and treatment for ***.   OBJECTIVE IMPAIRMENTS: {opptimpairments:25111}.   ACTIVITY LIMITATIONS: {activitylimitations:27494}  PARTICIPATION LIMITATIONS: {participationrestrictions:25113}  PERSONAL FACTORS: {Personal factors:25162} are also affecting patient's functional outcome.   REHAB POTENTIAL: {rehabpotential:25112}  CLINICAL DECISION MAKING: {clinical decision making:25114}  EVALUATION COMPLEXITY: {Evaluation complexity:25115}   PLAN:  PT FREQUENCY: {rehab frequency:25116}  PT DURATION: {rehab duration:25117}  PLANNED INTERVENTIONS: {rehab planned interventions:25118::97110-Therapeutic exercises,97530- Therapeutic [phone]- Neuromuscular re-education,97535- Self Rjmz,02859- Manual therapy}  PLAN FOR NEXT SESSION: ***   Greig GORMAN Quivers, PT 02/12/2024, 2:49 PM

## 2024-02-12 NOTE — Telephone Encounter (Signed)
 Apt scheduled.

## 2024-02-12 NOTE — Telephone Encounter (Signed)
 Provider (DOD) will not be here on Friday, so need to r/s appt to Monday w/DOD (Stacks).

## 2024-02-13 ENCOUNTER — Ambulatory Visit

## 2024-02-16 ENCOUNTER — Inpatient Hospital Stay: Admission: RE | Admit: 2024-02-16 | Source: Ambulatory Visit

## 2024-02-16 ENCOUNTER — Ambulatory Visit (INDEPENDENT_AMBULATORY_CARE_PROVIDER_SITE_OTHER)

## 2024-02-16 ENCOUNTER — Other Ambulatory Visit: Payer: Self-pay | Admitting: Family Medicine

## 2024-02-16 ENCOUNTER — Encounter (HOSPITAL_COMMUNITY)

## 2024-02-16 DIAGNOSIS — E1142 Type 2 diabetes mellitus with diabetic polyneuropathy: Secondary | ICD-10-CM

## 2024-02-16 DIAGNOSIS — Z5941 Food insecurity: Secondary | ICD-10-CM

## 2024-02-16 DIAGNOSIS — Z7189 Other specified counseling: Secondary | ICD-10-CM

## 2024-02-16 DIAGNOSIS — E119 Type 2 diabetes mellitus without complications: Secondary | ICD-10-CM

## 2024-02-16 DIAGNOSIS — Z780 Asymptomatic menopausal state: Secondary | ICD-10-CM | POA: Diagnosis not present

## 2024-02-16 DIAGNOSIS — F17210 Nicotine dependence, cigarettes, uncomplicated: Secondary | ICD-10-CM

## 2024-02-16 DIAGNOSIS — Z79899 Other long term (current) drug therapy: Secondary | ICD-10-CM

## 2024-02-16 DIAGNOSIS — Z13820 Encounter for screening for osteoporosis: Secondary | ICD-10-CM

## 2024-02-16 DIAGNOSIS — Z0000 Encounter for general adult medical examination without abnormal findings: Secondary | ICD-10-CM

## 2024-02-16 DIAGNOSIS — Z1231 Encounter for screening mammogram for malignant neoplasm of breast: Secondary | ICD-10-CM

## 2024-02-16 DIAGNOSIS — I152 Hypertension secondary to endocrine disorders: Secondary | ICD-10-CM

## 2024-02-16 DIAGNOSIS — E1169 Type 2 diabetes mellitus with other specified complication: Secondary | ICD-10-CM

## 2024-02-16 DIAGNOSIS — G4733 Obstructive sleep apnea (adult) (pediatric): Secondary | ICD-10-CM

## 2024-02-16 DIAGNOSIS — H539 Unspecified visual disturbance: Secondary | ICD-10-CM

## 2024-02-16 DIAGNOSIS — Z9181 History of falling: Secondary | ICD-10-CM

## 2024-02-16 DIAGNOSIS — H532 Diplopia: Secondary | ICD-10-CM

## 2024-02-16 DIAGNOSIS — E11649 Type 2 diabetes mellitus with hypoglycemia without coma: Secondary | ICD-10-CM

## 2024-02-16 DIAGNOSIS — J449 Chronic obstructive pulmonary disease, unspecified: Secondary | ICD-10-CM

## 2024-02-16 DIAGNOSIS — Z7409 Other reduced mobility: Secondary | ICD-10-CM

## 2024-02-17 ENCOUNTER — Ambulatory Visit (HOSPITAL_COMMUNITY)

## 2024-02-17 ENCOUNTER — Ambulatory Visit (INDEPENDENT_AMBULATORY_CARE_PROVIDER_SITE_OTHER): Admitting: Nurse Practitioner

## 2024-02-17 ENCOUNTER — Encounter: Payer: Self-pay | Admitting: Nurse Practitioner

## 2024-02-17 ENCOUNTER — Telehealth (HOSPITAL_COMMUNITY): Payer: Self-pay

## 2024-02-17 ENCOUNTER — Ambulatory Visit: Payer: Self-pay | Admitting: Nurse Practitioner

## 2024-02-17 VITALS — BP 119/81 | HR 90 | Temp 98.3°F | Ht 69.0 in | Wt 170.0 lb

## 2024-02-17 DIAGNOSIS — R300 Dysuria: Secondary | ICD-10-CM

## 2024-02-17 DIAGNOSIS — N3000 Acute cystitis without hematuria: Secondary | ICD-10-CM

## 2024-02-17 LAB — URINALYSIS, ROUTINE W REFLEX MICROSCOPIC
Bilirubin, UA: NEGATIVE
Glucose, UA: NEGATIVE
Ketones, UA: NEGATIVE
Leukocytes,UA: NEGATIVE
Nitrite, UA: POSITIVE — AB
Protein,UA: NEGATIVE
RBC, UA: NEGATIVE
Specific Gravity, UA: 1.025 (ref 1.005–1.030)
Urobilinogen, Ur: 0.2 mg/dL (ref 0.2–1.0)
pH, UA: 5.5 (ref 5.0–7.5)

## 2024-02-17 LAB — MICROSCOPIC EXAMINATION
RBC, Urine: NONE SEEN /HPF (ref 0–2)
Renal Epithel, UA: NONE SEEN /HPF
Yeast, UA: NONE SEEN

## 2024-02-17 MED ORDER — FLUCONAZOLE 150 MG PO TABS: ORAL_TABLET | ORAL | 0 refills | Status: DC

## 2024-02-17 MED ORDER — DOXYCYCLINE HYCLATE 100 MG PO TABS: 100.0000 mg | ORAL_TABLET | Freq: Two times a day (BID) | ORAL | 0 refills | Status: DC

## 2024-02-17 NOTE — Telephone Encounter (Signed)
 Spoke with patient who did not realize she had a PT appointment.  She state she is no longer taking pain meds and Lyrica  together so is not having any more issues with dizziness at this time.  She will call us  if she has any further issues.  2:49 PM, 02/17/24 Alliene Klugh Small Mekaila Tarnow MPT [facility] [place] [phone]

## 2024-02-17 NOTE — Progress Notes (Signed)
 Subjective:    [name], female    DOB: 1955/12/23, 68 y.o.   MRN: [id number]   Chief Complaint: Dysuria   Dysuria  This is a new problem. The current episode started more than 1 month ago. The problem occurs every urination. The problem has been waxing and waning. The pain is at a severity of 0/10. The patient is experiencing no pain. There has been no fever. She is Sexually active. Associated symptoms include hesitancy. Pertinent negatives include no discharge, frequency or urgency. Associated symptoms comments: Urine smells like ammonia. She has tried nothing for the symptoms.    Patient Active Problem List   Diagnosis Date Noted   Diabetes mellitus (HCC) 08/12/2023   Abdominal pain 06/17/2023   Colitis 05/05/2023   Diabetes mellitus treated with insulin  and oral medication (HCC) 11/22/2022   Long-term current use of injectable noninsulin antidiabetic medication 11/22/2022   S/P hysterectomy 10/28/2022   History of shingles 10/28/2022   Chronic allergic rhinitis 08/22/2022   Seasonal allergic rhinitis due to pollen 03/29/2022   Polypharmacy 03/29/2022   Hyperlipidemia associated with type 2 diabetes mellitus (HCC) 02/06/2022   Hx of adenomatous polyp of colon 12/25/2021   Genitourinary syndrome of menopause 12/07/2021   Osteopenia 10/31/2021   Aortic atherosclerosis (HCC) 10/31/2021   Hepatic steatosis 10/31/2021   Falls frequently 10/26/2021   Constipation 10/26/2021   Gait abnormality 05/21/2021   Lumbar radiculopathy 05/02/2021   Polyneuropathy due to type 2 diabetes mellitus (HCC) 10/10/2020   Seasonal allergies 10/10/2020   Stress incontinence of urine 06/20/2020   Diplopia 03/20/2020   GERD (gastroesophageal reflux disease)    Hypertension associated with diabetes (HCC)    COPD without exacerbation (HCC)    Osteoarthritis of left knee 04/01/2019   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   Type 2 diabetes mellitus with hypoglycemia without coma, without  long-term current use of insulin  (HCC) 07/06/2018   Bilateral temporomandibular joint pain 01/21/2018   OSA (obstructive sleep apnea) 01/21/2018   Vertigo 01/21/2018   DDD (degenerative disc disease), cervical 07/17/2017   Degeneration of lumbar intervertebral disc 07/17/2017   OCD (obsessive compulsive disorder) 11/15/2015   Tobacco use disorder 11/15/2015   Current use of estrogen therapy 01/12/2014   PTSD (post-traumatic stress disorder) 08/03/2011   Bipolar I disorder, most recent episode mixed, severe with psychotic features (HCC)         Review of Systems  Constitutional:  Negative for diaphoresis.  Eyes:  Negative for pain.  Respiratory:  Negative for shortness of breath.   Cardiovascular:  Negative for chest pain, palpitations and leg swelling.  Gastrointestinal:  Negative for abdominal pain.  Endocrine: Negative for polydipsia.  Genitourinary:  Positive for dysuria and hesitancy. Negative for frequency and urgency.  Skin:  Negative for rash.  Neurological:  Negative for dizziness, weakness and headaches.  Hematological:  Does not bruise/bleed easily.  All other systems reviewed and are negative.      Objective:   Physical Exam Constitutional:      Appearance: Normal appearance.  Cardiovascular:     Rate and Rhythm: Normal rate and regular rhythm.     Heart sounds: Normal heart sounds.  Pulmonary:     Effort: Pulmonary effort is normal.     Breath sounds: Normal breath sounds.  Abdominal:     Tenderness: There is no right CVA tenderness or left CVA tenderness.  Skin:    General: Skin is warm.  Neurological:     General: No focal deficit present.  Mental Status: She is alert and oriented to person, place, and time.  Psychiatric:        Mood and Affect: Mood normal.        Behavior: Behavior normal.    BP 119/81   Pulse 90   Temp 98.3 F (36.8 C)   Ht 5' 9 (1.753 m)   Wt 170 lb (77.1 kg)   SpO2 96%   BMI 25.10 kg/m         Assessment &  Plan:   Devere MARLA Brunswick in today with chief complaint of Dysuria   1. Dysuria (Primary)  - Urinalysis, Routine w reflex microscopic - Urine Culture  2. Acute cystitis without hematuria Take medication as prescribe Cotton underwear Take shower not bath Cranberry juice, yogurt Force fluids AZO over the counter X2 days Culture pending RTO prn  - doxycycline  (VIBRA -TABS) 100 MG tablet; Take 1 tablet (100 mg total) by mouth 2 (two) times daily. 1 po bid  Dispense: 20 tablet; Refill: 0    The above assessment and management plan was discussed with the patient. The patient verbalized understanding of and has agreed to the management plan. Patient is aware to call the clinic if symptoms persist or worsen. Patient is aware when to return to the clinic for a follow-up visit. Patient educated on when it is appropriate to go to the emergency department.   Mary-Margaret Gladis, FNP

## 2024-02-17 NOTE — Patient Instructions (Signed)
 Take medication as prescribe Cotton underwear Take shower not bath Cranberry juice, yogurt Force fluids AZO over the counter X2 days Culture pending RTO prn

## 2024-02-19 ENCOUNTER — Other Ambulatory Visit: Payer: Self-pay | Admitting: *Deleted

## 2024-02-19 ENCOUNTER — Telehealth: Payer: Self-pay | Admitting: Pharmacist

## 2024-02-19 ENCOUNTER — Telehealth: Payer: Self-pay

## 2024-02-19 ENCOUNTER — Telehealth: Payer: Self-pay | Admitting: Family Medicine

## 2024-02-19 DIAGNOSIS — R296 Repeated falls: Secondary | ICD-10-CM

## 2024-02-19 DIAGNOSIS — M8589 Other specified disorders of bone density and structure, multiple sites: Secondary | ICD-10-CM | POA: Diagnosis not present

## 2024-02-19 DIAGNOSIS — E11649 Type 2 diabetes mellitus with hypoglycemia without coma: Secondary | ICD-10-CM

## 2024-02-19 DIAGNOSIS — Z780 Asymptomatic menopausal state: Secondary | ICD-10-CM | POA: Diagnosis not present

## 2024-02-19 DIAGNOSIS — R42 Dizziness and giddiness: Secondary | ICD-10-CM

## 2024-02-19 DIAGNOSIS — Z743 Need for continuous supervision: Secondary | ICD-10-CM | POA: Diagnosis not present

## 2024-02-19 DIAGNOSIS — R079 Chest pain, unspecified: Secondary | ICD-10-CM | POA: Diagnosis not present

## 2024-02-19 LAB — URINE CULTURE

## 2024-02-19 NOTE — Patient Outreach (Signed)
 Complex Care Management   Visit Note  02/19/2024  Name:  Cynthia Deleon MRN: [id number] DOB: [birth year]  Situation: Referral received for Complex Care Management related to COPD and Diabetes with Complications I obtained verbal consent from Patient.  Visit completed with Patient  on the phone  Background:   Past Medical History:  Diagnosis Date   Aortic atherosclerosis (HCC) 10/31/2021   Arthritis    hands and knees   Bipolar 1 disorder (HCC)    Borderline diabetic    Chronic back pain    Chronic neck pain    Complication of anesthesia    high anxiety-does not want to be alone   COPD (chronic obstructive pulmonary disease) (HCC)    Current use of estrogen therapy 01/12/2014   Depression    Diabetes mellitus without complication (HCC)    Diplopia    Family history of adverse reaction to anesthesia    sister gas in lungs   GERD (gastroesophageal reflux disease)    Headache    Hepatic steatosis 10/31/2021   Hot flashes 12/15/2013   Hypertension    IBS (irritable bowel syndrome)    Incontinence    Kidney stone    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HCC)    Osteopenia 02/06/2022   Panic attacks    Peptic ulcer    Peripheral neuropathy    Rosacea    Shingles    Sleep apnea    uses a cpap-with oxygen     Assessment: Patient Reported Symptoms:  Cognitive Cognitive Status: No symptoms reported Cognitive/Intellectual Conditions Management [RPT]: None reported or documented in medical history or problem list   Health Maintenance Behaviors: Annual physical exam Healing Pattern: Average Health Facilitated by: Rest  Neurological Neurological Review of Symptoms: Numbness, Weakness Neurological Management Strategies: Medication therapy Neurological Self-Management Outcome: 3 (uncertain)  HEENT HEENT Symptoms Reported: Nasal discharge      Cardiovascular Cardiovascular Symptoms Reported: No symptoms reported    Respiratory Respiratory Symptoms Reported: No symptoms  reported    Endocrine Endocrine Symptoms Reported: No symptoms reported Is patient diabetic?: Yes Is patient checking blood sugars at home?: Yes List most recent blood sugar readings, include date and time of day: 02-19-2024 0920 116 Endocrine Self-Management Outcome: 4 (good)  Gastrointestinal Gastrointestinal Symptoms Reported: Constipation Gastrointestinal Management Strategies: Medication therapy    Genitourinary Genitourinary Symptoms Reported: No symptoms reported Additional Genitourinary Details: being treated for UTI    Integumentary Integumentary Symptoms Reported: No symptoms reported    Musculoskeletal Musculoskelatal Symptoms Reviewed: Weakness Musculoskeletal Management Strategies: Medical device Falls in the past year?: Yes Number of falls in past year: 2 or more Was there an injury with Fall?: Yes Fall Risk Category Calculator: 3 Patient Fall Risk Level: High Fall Risk Patient at Risk for Falls Due to: Impaired balance/gait, Impaired mobility, Medication side effect Fall risk Follow up: Falls evaluation completed, Education provided  Psychosocial Psychosocial Symptoms Reported: Depression - if selected complete PHQ 2-9 Behavioral Management Strategies: Coping strategies Major Change/Loss/Stressor/Fears (CP): Medical condition, self Techniques to Cope with Loss/Stress/Change: Medication Do you feel physically threatened by others?: No    02/19/2024    PHQ2-9 Depression Screening   Little interest or pleasure in doing things Not at all  Feeling down, depressed, or hopeless Not at all  PHQ-2 - Total Score 0  Trouble falling or staying asleep, or sleeping too much More than half the days  Feeling tired or having little energy Several days  Poor appetite or overeating  Not at all  Feeling bad about  yourself - or that you are a failure or have let yourself or your family down Not at all  Trouble concentrating on things, such as reading the newspaper or watching  television Not at all  Moving or speaking so slowly that other people could have noticed.  Or the opposite - being so fidgety or restless that you have been moving around a lot more than usual Not at all  Thoughts that you would be better off dead, or hurting yourself in some way Not at all  PHQ2-9 Total Score 3  If you checked off any problems, how difficult have these problems made it for you to do your work, take care of things at home, or get along with other people Not difficult at all  Depression Interventions/Treatment Currently on Treatment    There were no vitals filed for this visit.  Medications Reviewed Today     Reviewed by Bertrum Rosina HERO, RN (Registered Nurse) on 02/19/24 at [id number]  Med List Status: <None>   Medication Order Taking? Sig Documenting Provider Last Dose Status Informant  albuterol  (VENTOLIN  HFA) 108 (90 Base) MCG/ACT inhaler [id number] Yes Inhale 1-2 puffs into the lungs every 6 (six) hours as needed for wheezing or shortness of breath. Hope Almarie ORN, NP  Active   ARIPiprazole  (ABILIFY ) 10 MG tablet [id number] Yes Take 10 mg by mouth every morning. [provider]  Active   Artificial Saliva (BIOTENE DRY MOUTH MOISTURIZING) SOLN [id number]  Use as directed 5 mLs in the mouth or throat in the morning and at bedtime.  Patient not taking: Reported on 02/19/2024   Severa Rock HERO, FNP  Consider Medication Status and Discontinue   aspirin EC 81 MG tablet [id number] Yes Take 81 mg by mouth daily. Swallow whole. [provider]  Active   atorvastatin  (LIPITOR) 80 MG tablet [id number] Yes Take 1 tablet (80 mg total) by mouth daily. Severa Rock HERO, FNP  Active   cetirizine  (ZYRTEC ) 10 MG tablet [id number] Yes Take 1 tablet (10 mg total) by mouth daily. Severa Rock HERO, FNP  Active   cholecalciferol  (VITAMIN D ) 25 MCG tablet [id number] Yes Take 1 tablet (1,000 Units total) by mouth daily. Jadine Toribio SQUIBB, MD  Active Self, Pharmacy Records  Continuous Glucose  Receiver (FREESTYLE LIBRE 3 READER) [place] [id number] Yes with compatible Freestyle Libre 3 sensor to monitor glucose continuously. DX: E11.65 Severa Rock HERO, FNP  Active   Continuous Glucose Sensor (FREESTYLE LIBRE 3 PLUS SENSOR) MISC [id number] Yes Change sensor every 15 days. DX: E11.65 Severa Rock HERO, FNP  Active   diazepam  (VALIUM ) 5 MG tablet [id number] Yes Take 5 mg by mouth 2 (two) times daily. [provider]  Active   doxycycline  (VIBRA -TABS) 100 MG tablet [id number] Yes Take 1 tablet (100 mg total) by mouth 2 (two) times daily. 1 po bid Gladis Mustard, FNP  Active   DULoxetine  (CYMBALTA ) 60 MG capsule [id number] Yes TAKE (1) CAPSULE BY MOUTH EVERY DAY. Onita Duos, MD  Active   estrogens , conjugated, (PREMARIN ) 0.3 MG tablet [id number] Yes TAKE (1) TABLET BY MOUTH ONCE DAILY. Severa Rock HERO, FNP  Active   ezetimibe  (ZETIA ) 10 MG tablet [id number] Yes TAKE (1) TABLET BY MOUTH ONCE DAILY. Severa Rock HERO, FNP  Active   fluconazole  (DIFLUCAN ) 150 MG tablet [id number] Yes 1 po q week x 4 weeks Gladis Mustard, FNP  Active   fluorometholone (FML) 0.1 % ophthalmic suspension [id number]  SMARTSIG:In Eye(s)  Patient not taking: Reported on 02/19/2024  [provider]  Consider Medication Status and Discontinue   fluticasone  (FLONASE ) 50 MCG/ACT nasal spray [id number] Yes Place 2 sprays into both nostrils daily. Joesph Annabella HERO, FNP  Active   hyoscyamine  (LEVSIN  SL) 0.125 MG SL tablet [id number] Yes Place 1 tablet (0.125 mg total) under the tongue every 8 (eight) hours as needed (abdominal pain). Castaneda Mayorga, Toribio, MD  Active   insulin  lispro (HUMALOG  KWIKPEN) 100 UNIT/ML KwikPen [id number]  Use 5 units 2 times daily with breakfast and dinner when blood sugar is >200 on steroids  Patient not taking: Reported on 02/19/2024   Severa Rock HERO, FNP  Consider Medication Status and Discontinue   Insulin  Pen Needle (PIP PEN NEEDLES 32G X ) 32G X 4 MM MISC [id number] Yes Use to  inject insulin  2 times daily as needed. DX E11.65 Severa Rock HERO, FNP  Active   lamoTRIgine  (LAMICTAL ) 200 MG tablet [id number] Yes Take 200 mg by mouth at bedtime. For mood disorder. [provider]  Active Self, Pharmacy Records  lansoprazole  (PREVACID ) 30 MG capsule [id number] Yes TAKE (1) CAPSULE BY MOUTH ONCE DAILY. Severa Rock HERO, FNP  Active   lisinopril  (ZESTRIL ) 5 MG tablet [id number] Yes Take 1 tablet (5 mg total) by mouth daily. Severa Rock HERO, FNP  Active   meclizine  (ANTIVERT ) 25 MG tablet [id number] Yes Take 2 tablets (50 mg total) by mouth daily as needed for dizziness. Joesph Annabella HERO, FNP  Active   montelukast  (SINGULAIR ) 10 MG tablet [id number] Yes TAKE (1) TABLET BY MOUTH AT BEDTIME. Hope Almarie ORN, NP  Active   MYRBETRIQ  50 MG TB24 tablet [id number] Yes Take 1 tablet (50 mg total) by mouth daily. Severa Rock HERO, FNP  Active   omeprazole  (PRILOSEC) 40 MG capsule [id number]  Take 1 capsule (40 mg total) by mouth daily. Castaneda Mayorga, Toribio, MD  Active   OneTouch Delica Lancets 30G [place] [id number] Yes Use to test blood sugar twice daily. DX: E11.9 Merlynn Niki FALCON, FNP  Active Self, Pharmacy Records  oxyCODONE  (ROXICODONE ) 15 MG immediate release tablet [id number] Yes Take 1 tablet (15 mg total) by mouth every 6 (six) hours as needed for pain. Maree, Pratik D, DO  Active   pregabalin  (LYRICA ) 100 MG capsule [id number] Yes Take 2 capsules by mouth 3 (three) times daily. [provider]  Active   promethazine  (PHENERGAN ) 12.5 MG tablet [id number]  Take 1 tablet (12.5 mg total) by mouth every 8 (eight) hours as needed for nausea or vomiting.  Patient not taking: Reported on 02/19/2024   Severa Rock HERO, FNP  Consider Medication Status and Discontinue   tirzepatide  (MOUNJARO ) 15 MG/0.5ML Pen [id number] Yes Inject 15 mg into the skin once a week. DX: E11.9 Severa Rock HERO, FNP  Active   triamcinolone  cream (KENALOG ) 0.1 % [id number]  Apply 1 Application topically 2 (two) times  daily.  Patient not taking: Reported on 02/19/2024   Severa Rock HERO, FNP  Consider Medication Status and Discontinue   umeclidinium-vilanterol (ANORO ELLIPTA ) 62.5-25 MCG/ACT AEPB [id number] Yes Inhale 1 puff into the lungs daily. Hope Almarie ORN, NP  Active   valACYclovir  (VALTREX ) 1000 MG tablet [id number] Yes Take 1 tablet (1,000 mg total) by mouth daily. Please put in dispill Rakes, Rock HERO, FNP  Active   vitamin B-12 1000 MCG tablet [id number] Yes Take 1 tablet (1,000 mcg total) by mouth daily. Jadine Toribio SQUIBB, MD  Active Self, Pharmacy Records           Med Note (  TRUDY MORRISON S   Wed Nov 21, 2021  3:25 PM) Pt has not started yet  VRAYLAR 3 MG capsule [id number] Yes Take 3 mg by mouth at bedtime. [provider]  Active   Med List Note [place] HERO, [place] 05/22/22 1546): Not Rx'd by PCP: sertraline, valium , lamotrigine , and oxycodone , lyrica - [facility] 12/02/19.            Recommendation:   DME requests:  walker  Follow Up Plan:   Telephone follow-up in 1 month Referral to BSW  Rosina Forte, BSN RN [facility], [facility] RN Care Manager Direct Dial : [phone]  Fax: [phone]

## 2024-02-19 NOTE — Telephone Encounter (Signed)
 Spoke with patient. Advised she was busy and unable to schedule her annual Lung CT. Request call back later.

## 2024-02-19 NOTE — Patient Instructions (Signed)
 Visit Information  Thank you for taking time to visit with me today. Please don't hesitate to contact me if I can be of assistance to you before our next scheduled appointment.  Your next care management appointment is by telephone on 03-22-2024 at 9:00 am  Telephone follow-up in 1 month  Please call the care guide team at [phone] if you need to cancel, schedule, or reschedule an appointment.   Please call the Suicide and Crisis Lifeline: 988 call the [place]  National Suicide Prevention Lifeline: 281-276-4353 or TTY: 7273918856 TTY 205-635-8343) to talk to a trained counselor call 1-800-273-TALK (toll free, 24 hour hotline) call the [facility]: 8736998101 call 911 if you are experiencing a Mental Health or Behavioral Health Crisis or need someone to talk to.  Rosina Forte, BSN RN [facility], [facility] RN Care Manager Direct Dial : [phone]  Fax: [phone]

## 2024-02-19 NOTE — Telephone Encounter (Signed)
-----   Message from Nurse Rosina MATSU sent at 02/19/2024  9:36 AM EDT ----- Regarding: DME Request Good morning,  Patient is reporting increased falls. She states her neuropathy is worse and she falls when she uses her walker with wheels. She wants a standard walker with no wheels ordered. She also mentioned about a prescription for diabetic shoes but I did not see that request in her chart.   Please let me know if you have any questions.  Rosina Forte, BSN RN [facility], [facility] RN Care Manager Direct Dial : [phone]  Fax:[phone]

## 2024-02-20 ENCOUNTER — Ambulatory Visit: Payer: Self-pay | Admitting: Family Medicine

## 2024-02-20 NOTE — Telephone Encounter (Signed)
 Patient states she needs it for her vertigo. She needs something with no wheels on it for when she has her spells.  Also states that EMS was called lastnight due to check pain.  States they said it was her acid reflux and she needed lab work.  Advised patient to go to urgent care today since we have no openings.  Patient states well can she just change my acid reflux medication since it is not working.  Advised patient I would send a message but she still needed to go to urgent care for evaluation.  She is currently on omeprazole  40 mg daily and states it is not working.

## 2024-02-20 NOTE — Telephone Encounter (Signed)
 Can we do order for walker Diabetic shoes will need to be done at [facility] on 03/03/24 for documentation and foot exam

## 2024-02-20 NOTE — Telephone Encounter (Signed)
 Lmtcb When calls back review what michelle says and ask where the walker needs to get faxed to  Rx printed and I have

## 2024-02-23 ENCOUNTER — Ambulatory Visit: Payer: Self-pay

## 2024-02-23 ENCOUNTER — Ambulatory Visit: Admitting: Nurse Practitioner

## 2024-02-23 ENCOUNTER — Encounter: Payer: Self-pay | Admitting: Acute Care

## 2024-02-23 NOTE — Telephone Encounter (Signed)
 Patient has appt today with dod

## 2024-02-23 NOTE — Progress Notes (Unsigned)
 Subjective:  [name], female    DOB: February 03, 1956, 68 y.o.   MRN: [id number]  Patient Care Team: Severa Rock HERO, FNP as PCP - General (Family Medicine) Tasia Lung, MD as Consulting Physician (Psychiatry) Milton Lade, MD as Consulting Physician (Neurology) Billee Mliss BIRCH, [facility] (Pharmacist) Darroll Anes, DO (Optometry) Hope Almarie ORN, NP as Nurse Practitioner (Pulmonary Disease) Porter Andrez SAUNDERS, PA-C (Inactive) (Dermatology) Bonner Ade, MD as Consulting Physician (Physical Medicine and Rehabilitation) O'Neal, Darryle Ned, MD as Consulting Physician (Cardiology) Signa Delon LABOR, NP as Nurse Practitioner (Obstetrics and Gynecology) Bertrum Rosina HERO, RN as [facility] Care Management   Chief Complaint:  No chief complaint on file.   HPI: Cynthia Deleon is a 68 y.o. female presenting on 02/23/2024 for No chief complaint on file.  Was seen 02/17/2024 for dysuria and treated with ATB with Doxy BY, OTC AZO , culture was sent, but show no growth  Discussed the use of AI scribe software for clinical note transcription with the patient, who gave verbal consent to proceed.  History of Present Illness       Relevant past medical, surgical, family, and social history reviewed and updated as indicated.  Allergies and medications reviewed and updated. Data reviewed: Chart in Epic.   Past Medical History:  Diagnosis Date   Aortic atherosclerosis (HCC) 10/31/2021   Arthritis    hands and knees   Bipolar 1 disorder (HCC)    Borderline diabetic    Chronic back pain    Chronic neck pain    Complication of anesthesia    high anxiety-does not want to be alone   COPD (chronic obstructive pulmonary disease) (HCC)    Current use of estrogen therapy 01/12/2014   Depression    Diabetes mellitus without complication (HCC)    Diplopia    Family history of adverse reaction to anesthesia    sister gas in lungs   GERD (gastroesophageal reflux disease)     Headache    Hepatic steatosis 10/31/2021   Hot flashes 12/15/2013   Hypertension    IBS (irritable bowel syndrome)    Incontinence    Kidney stone    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HCC)    Osteopenia 02/06/2022   Panic attacks    Peptic ulcer    Peripheral neuropathy    Rosacea    Shingles    Sleep apnea    uses a cpap-with oxygen     Past Surgical History:  Procedure Laterality Date   BIOPSY  04/27/2020   Procedure: BIOPSY;  Surgeon: Donnald Charleston, MD;  Location: [facility];  Service: Endoscopy;;   BUNIONECTOMY WITH HAMMERTOE RECONSTRUCTION Right 12/10/2012   Procedure: RIGHT FIRST METATARSAL CHEVRON BUNION CORRECTION,  2 AND 3 HAMMERTOE CORRECTION , RIGHT 3 AND 4 TOE NAIL EXCISION ;  Surgeon: Norleen Armor, MD;  Location: [facility];  Service: Orthopedics;  Laterality: Right;   COLONOSCOPY WITH PROPOFOL  N/A 04/27/2020   Procedure: COLONOSCOPY WITH PROPOFOL ;  Surgeon: Donnald Charleston, MD;  Location: [facility];  Service: Endoscopy;  Laterality: N/A;   COLONOSCOPY WITH PROPOFOL  N/A 06/17/2023   Procedure: COLONOSCOPY WITH PROPOFOL ;  Surgeon: Eartha Angelia Sieving, MD;  [facility];  Service: Gastroenterology;  Laterality: N/A;  2:30PM;ASA 3   FOOT ARTHRODESIS  2000   both feet   LIPOSUCTION  03/2018   POLYPECTOMY  06/17/2023   Procedure: POLYPECTOMY;  Surgeon: Eartha Angelia Sieving, MD;  [facility];  Service: Gastroenterology;;   RECTAL  SURGERY     TONSILLECTOMY     TOTAL ABDOMINAL HYSTERECTOMY      Social History   Socioeconomic History   Marital status: Married    Spouse name: Not on file   Number of children: 5   Years of education: 9th   Highest education level: Not on file  Occupational History   Occupation: Disabled  Tobacco Use   Smoking status: Former    Current packs/day: 0.25    Average packs/day: 1 pack/day for 43.7 years (42.4 ttl pk-yrs)    Types: Cigarettes    Start date: 06/11/1975    Quit date:  06/10/2017   Smokeless tobacco: Never  Vaping Use   Vaping status: Never Used  Substance and Sexual Activity   Alcohol  use: No    Alcohol /week: 0.0 standard drinks of alcohol    Drug use: No   Sexual activity: Not Currently    Birth control/protection: Surgical    Comment: hyst  Other Topics Concern   Not on file  Social History Narrative   Lives at home with home with her son (has five children).   Right-handed.   1 cup caffeine per day.   Social Drivers of Corporate investment banker Strain: Low Risk  (01/15/2024)   Overall Financial Resource Strain (CARDIA)    Difficulty of Paying Living Expenses: Not hard at all  Recent Concern: Financial Resource Strain - High Risk (12/26/2023)   Overall Financial Resource Strain (CARDIA)    Difficulty of Paying Living Expenses: Hard  Food Insecurity: No Food Insecurity (01/15/2024)   Hunger Vital Sign    Worried About Running Out of Food in the Last Year: Never true    Ran Out of Food in the Last Year: Never true  Recent Concern: Food Insecurity - Food Insecurity Present (12/26/2023)   Hunger Vital Sign    Worried About Running Out of Food in the Last Year: Often true    Ran Out of Food in the Last Year: Sometimes true  Transportation Needs: Unmet Transportation Needs (01/15/2024)   PRAPARE - Administrator, Civil Service (Medical): Yes    Lack of Transportation (Non-Medical): No  Physical Activity: Sufficiently Active (12/26/2023)   Exercise Vital Sign    Days of Exercise per Week: 7 days    Minutes of Exercise per Session: 30 min  Stress: Stress Concern Present (12/26/2023)   [employer] of Occupational Health - Occupational Stress Questionnaire    Feeling of Stress: Rather much  Social Connections: Moderately Isolated (01/09/2024)   Social Connection and Isolation Panel    Frequency of Communication with Friends and Family: Three times a week    Frequency of Social Gatherings with Friends and Family: Twice a week    Attends  Religious Services: More than 4 times per year    Active Member of [employer] or Organizations: No    Attends Banker Meetings: Never    Marital Status: Divorced  Catering manager Violence: Not At Risk (01/15/2024)   Humiliation, Afraid, Rape, and Kick questionnaire    Fear of Current or Ex-Partner: No    Emotionally Abused: No    Physically Abused: No    Sexually Abused: No    Outpatient Encounter Medications as of 02/23/2024  Medication Sig   albuterol  (VENTOLIN  HFA) 108 (90 Base) MCG/ACT inhaler Inhale 1-2 puffs into the lungs every 6 (six) hours as needed for wheezing or shortness of breath.   ARIPiprazole  (ABILIFY ) 10 MG tablet Take 10 mg by  mouth every morning.   Artificial Saliva (BIOTENE DRY MOUTH MOISTURIZING) SOLN Use as directed 5 mLs in the mouth or throat in the morning and at bedtime. (Patient not taking: Reported on 02/19/2024)   aspirin EC 81 MG tablet Take 81 mg by mouth daily. Swallow whole.   atorvastatin  (LIPITOR) 80 MG tablet Take 1 tablet (80 mg total) by mouth daily.   cetirizine  (ZYRTEC ) 10 MG tablet Take 1 tablet (10 mg total) by mouth daily.   cholecalciferol  (VITAMIN D ) 25 MCG tablet Take 1 tablet (1,000 Units total) by mouth daily.   Continuous Glucose Receiver (FREESTYLE LIBRE 3 READER) DEVI with compatible Freestyle Libre 3 sensor to monitor glucose continuously. DX: E11.65   Continuous Glucose Sensor (FREESTYLE LIBRE 3 PLUS SENSOR) MISC Change sensor every 15 days. DX: E11.65   diazepam  (VALIUM ) 5 MG tablet Take 5 mg by mouth 2 (two) times daily.   doxycycline  (VIBRA -TABS) 100 MG tablet Take 1 tablet (100 mg total) by mouth 2 (two) times daily. 1 po bid   DULoxetine  (CYMBALTA ) 60 MG capsule TAKE (1) CAPSULE BY MOUTH EVERY DAY.   estrogens , conjugated, (PREMARIN ) 0.3 MG tablet TAKE (1) TABLET BY MOUTH ONCE DAILY.   ezetimibe  (ZETIA ) 10 MG tablet TAKE (1) TABLET BY MOUTH ONCE DAILY.   fluconazole  (DIFLUCAN ) 150 MG tablet 1 po q week x 4 weeks    fluorometholone (FML) 0.1 % ophthalmic suspension SMARTSIG:In Eye(s) (Patient not taking: Reported on 02/19/2024)   fluticasone  (FLONASE ) 50 MCG/ACT nasal spray Place 2 sprays into both nostrils daily.   hyoscyamine  (LEVSIN  SL) 0.125 MG SL tablet Place 1 tablet (0.125 mg total) under the tongue every 8 (eight) hours as needed (abdominal pain).   insulin  lispro (HUMALOG  KWIKPEN) 100 UNIT/ML KwikPen Use 5 units 2 times daily with breakfast and dinner when blood sugar is >200 on steroids (Patient not taking: Reported on 02/19/2024)   Insulin  Pen Needle (PIP PEN NEEDLES 32G X ) 32G X 4 MM MISC Use to inject insulin  2 times daily as needed. DX E11.65   lamoTRIgine  (LAMICTAL ) 200 MG tablet Take 200 mg by mouth at bedtime. For mood disorder.   lansoprazole  (PREVACID ) 30 MG capsule TAKE (1) CAPSULE BY MOUTH ONCE DAILY.   lisinopril  (ZESTRIL ) 5 MG tablet Take 1 tablet (5 mg total) by mouth daily.   meclizine  (ANTIVERT ) 25 MG tablet Take 2 tablets (50 mg total) by mouth daily as needed for dizziness.   montelukast  (SINGULAIR ) 10 MG tablet TAKE (1) TABLET BY MOUTH AT BEDTIME.   MYRBETRIQ  50 MG TB24 tablet Take 1 tablet (50 mg total) by mouth daily.   omeprazole  (PRILOSEC) 40 MG capsule Take 1 capsule (40 mg total) by mouth daily.   OneTouch Delica Lancets 30G MISC Use to test blood sugar twice daily. DX: E11.9   oxyCODONE  (ROXICODONE ) 15 MG immediate release tablet Take 1 tablet (15 mg total) by mouth every 6 (six) hours as needed for pain.   pregabalin  (LYRICA ) 100 MG capsule Take 2 capsules by mouth 3 (three) times daily.   promethazine  (PHENERGAN ) 12.5 MG tablet Take 1 tablet (12.5 mg total) by mouth every 8 (eight) hours as needed for nausea or vomiting. (Patient not taking: Reported on 02/19/2024)   tirzepatide  (MOUNJARO ) 15 MG/0.5ML Pen Inject 15 mg into the skin once a week. DX: E11.9   triamcinolone  cream (KENALOG ) 0.1 % Apply 1 Application topically 2 (two) times daily. (Patient not taking: Reported  on 02/19/2024)   umeclidinium-vilanterol (ANORO ELLIPTA ) 62.5-25 MCG/ACT AEPB Inhale  1 puff into the lungs daily.   valACYclovir  (VALTREX ) 1000 MG tablet Take 1 tablet (1,000 mg total) by mouth daily. Please put in dispill   vitamin B-12 1000 MCG tablet Take 1 tablet (1,000 mcg total) by mouth daily.   VRAYLAR 3 MG capsule Take 3 mg by mouth at bedtime.   No facility-administered encounter medications on file as of 02/23/2024.    Allergies  Allergen Reactions   Divalproex Sodium Other (See Comments)    Hallucinations   Other reaction(s): Confusion  Hallucinations     Other reaction(s): Confusion    Hallucinations   Other Anaphylaxis, Other (See Comments), Nausea And Vomiting and Nausea Only   Pseudoeph-Hydrocodone -Gg Nausea Only   Sulfa Antibiotics Anaphylaxis, Nausea Only, Other (See Comments) and Swelling    Swelling of tongue.   Valproic Acid Other (See Comments)   Varenicline Other (See Comments)    Altered mental status-Per patient was put on allergy list by Dr Todd Cliche bu patient staes she is not allergic to this and is currently taking it.   Varenicline Tartrate Other (See Comments)    Other reaction(s): Confusion   Codeine Nausea And Vomiting and Other (See Comments)    Other reaction(s): vomiting   Hydrocodone  Nausea And Vomiting and Other (See Comments)   Acetaminophen  Nausea Only   Ambien  [Zolpidem ] Other (See Comments)    Causes sleep walking   Clavulanic Acid Diarrhea   Elemental Sulfur Other (See Comments)    Swelling of tongue   Hydrocod Poli-Chlorphe Poli Er Other (See Comments)    Other reaction(s): Skin itches    Macrobid  [Nitrofurantoin ] Nausea And Vomiting    Dizziness, nausea, vomiting   Paxil [Paroxetine Hcl] Other (See Comments)    Causes ringing in the ears.    Prednisone  Other (See Comments)    Other reaction(s): Unknown    Sulfur Other (See Comments)    sulfur   Amoxicillin  Rash    Has patient had a PCN reaction causing immediate rash,  facial/tongue/throat swelling, SOB or lightheadedness with hypotension: No Has patient had a PCN reaction causing severe rash involving mucus membranes or skin necrosis: No Has patient had a PCN reaction that required hospitalization: No Has patient had a PCN reaction occurring within the last 10 years: Yes If all of the above answers are NO, then may proceed with Cephalosporin use.    Farxiga  [Dapagliflozin ]     RECURRENT YEAST/UTI   Metformin  And Related Diarrhea   Tape Rash    Pertinent ROS per HPI, otherwise unremarkable      Objective:  There were no vitals taken for this visit.   Wt Readings from Last 3 Encounters:  02/17/24 170 lb (77.1 kg)  01/30/24 169 lb 9.6 oz (76.9 kg)  01/09/24 167 lb (75.8 kg)    Physical Exam Physical Exam      Results for orders placed or performed in visit on 02/17/24  Microscopic Examination   Collection Time: 02/17/24  9:23 AM   Urine  Result Value Ref Range   WBC, UA 0-5 0 - 5 /hpf   RBC, Urine None seen 0 - 2 /hpf   Epithelial Cells (non renal) 0-10 0 - 10 /hpf   Renal Epithel, UA None seen None seen /hpf   Bacteria, UA Many (A) None seen/Few   Yeast, UA None seen None seen  Urinalysis, Routine w reflex microscopic   Collection Time: 02/17/24  9:23 AM  Result Value Ref Range   Specific Gravity, UA 1.025 1.005 -  1.030   pH, UA 5.5 5.0 - 7.5   Color, UA Yellow Yellow   Appearance Ur Clear Clear   Leukocytes,UA Negative Negative   Protein,UA Negative Negative/Trace   Glucose, UA Negative Negative   Ketones, UA Negative Negative   RBC, UA Negative Negative   Bilirubin, UA Negative Negative   Urobilinogen, Ur 0.2 0.2 - 1.0 mg/dL   Nitrite, UA Positive (A) Negative   Microscopic Examination See below:   Urine Culture   Collection Time: 02/17/24 10:00 AM   Specimen: Urine   UR  Result Value Ref Range   Urine Culture, Routine Final report    Organism ID, Bacteria Comment        Pertinent labs & imaging results that  were available during my care of the patient were reviewed by me and considered in my medical decision making.  Assessment & Plan:  There are no diagnoses linked to this encounter.   Assessment and Plan Assessment & Plan       Continue all other maintenance medications.  Follow up plan: No follow-ups on file.   Continue healthy lifestyle choices, including diet (rich in fruits, vegetables, and lean proteins, and low in salt and simple carbohydrates) and exercise (at least 30 minutes of moderate physical activity daily).  Educational handout given for ***  The above assessment and management plan was discussed with the patient. The patient verbalized understanding of and has agreed to the management plan. Patient is aware to call the clinic if they develop any new symptoms or if symptoms persist or worsen. Patient is aware when to return to the clinic for a follow-up visit. Patient educated on when it is appropriate to go to the emergency department.  @SIGNATURE @

## 2024-02-23 NOTE — Telephone Encounter (Signed)
 FYI Only or Action Required?: FYI only for provider.  Patient was last seen in primary care on 02/17/2024 by Cynthia Mustard, FNP.  Called Nurse Triage reporting Allergic Reaction and Medication Reaction.  Symptoms began several days ago.  Interventions attempted: ABX.  Symptoms are: gradually worsening.  Triage Disposition: See PCP When Office is Open (Within 3 Days)  Patient/caregiver understands and will follow disposition?: Yes                   Copied from CRM #[id number]. Topic: Clinical - Red Word Triage >> Feb 23, 2024  7:57 AM Tobias L wrote: Red Word that prompted transfer to Nurse Triage: reaction to antibiotic, still in pain    doxycycline  (VIBRA -TABS) 100 MG tablet  Reason for Disposition  Prescription request for new medicine (not a refill)  [1] MODERATE back pain (e.g., interferes with normal activities) AND [2] present > 3 days  Answer Assessment - Initial Assessment Questions 1. NAME of MEDICINE: What medicine(s) are you calling about?     doxycycline  (VIBRA -TABS) 100 MG tablet 2. QUESTION: What is your question? (e.g., double dose of medicine, side effect)     Pt had Acid reflux after taking the abx 3. PRESCRIBER: Who prescribed the medicine? Reason: if prescribed by specialist, call should be referred to that group.     PCP 4. SYMPTOMS: Do you have any symptoms? If Yes, ask: What symptoms are you having?  How bad are the symptoms (e.g., mild, moderate, severe)     PT states that when she took ABX she developed terrible acid reflux and called EMS as she thought this was a heart attack. Pt states that she has discontinued the ABX, and now ammonia smell of urine has returned. Pt has pain in lower back as well.  Answer Assessment - Initial Assessment Questions 1. ONSET: When did the pain begin? (e.g., minutes, hours, days)     Ongoing with possible UTI 2. LOCATION: Where does it hurt? (upper, mid or lower back)     Lower  back 3. SEVERITY: How bad is the pain?  (e.g., Scale 1-10; mild, moderate, or severe)     moderate  6. CAUSE:  What do you think is causing the back pain?      UTI  8. MEDICINES: What have you taken so far for the pain? (e.g., nothing, acetaminophen , NSAIDS)     ABX  Protocols used: Medication Question Call-A-AH, Back Pain-A-AH

## 2024-02-23 NOTE — Telephone Encounter (Signed)
 Patient would like it faxed to [employer].  Faxed and patient aware

## 2024-02-25 DIAGNOSIS — R269 Unspecified abnormalities of gait and mobility: Secondary | ICD-10-CM | POA: Diagnosis not present

## 2024-02-25 DIAGNOSIS — R296 Repeated falls: Secondary | ICD-10-CM | POA: Diagnosis not present

## 2024-02-27 ENCOUNTER — Other Ambulatory Visit: Payer: Self-pay

## 2024-02-27 NOTE — Patient Instructions (Signed)
 Visit Information  Thank you for taking time to visit with me today. Please don't hesitate to contact me if I can be of assistance to you before our next scheduled appointment.  Your next care management appointment is by telephone on 03/02/24 at 3pm   Please call the care guide team at [phone] if you need to cancel, schedule, or reschedule an appointment.   Please call 911 if you are experiencing a Mental Health or Behavioral Health Crisis or need someone to talk to.  Tillman Gardener, BSW [place]  [facility], [facility] Social Worker Direct Dial : [phone]  Fax: [phone] Website: [email or web address]

## 2024-02-27 NOTE — Patient Outreach (Signed)
 Complex Care Management   Visit Note  02/27/2024  Name:  Cynthia Deleon MRN: [id number] DOB: February 28, 1956  Situation: Referral received for Complex Care Management related to SDOH Barriers:  Life Alert I obtained verbal consent from Patient.  Visit completed with Patient  on the phone  Background:   Past Medical History:  Diagnosis Date   Aortic atherosclerosis (HCC) 10/31/2021   Arthritis    hands and knees   Bipolar 1 disorder (HCC)    Borderline diabetic    Chronic back pain    Chronic neck pain    Complication of anesthesia    high anxiety-does not want to be alone   COPD (chronic obstructive pulmonary disease) (HCC)    Current use of estrogen therapy 01/12/2014   Depression    Diabetes mellitus without complication (HCC)    Diplopia    Family history of adverse reaction to anesthesia    sister gas in lungs   GERD (gastroesophageal reflux disease)    Headache    Hepatic steatosis 10/31/2021   Hot flashes 12/15/2013   Hypertension    IBS (irritable bowel syndrome)    Incontinence    Kidney stone    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HCC)    Osteopenia 02/06/2022   Panic attacks    Peptic ulcer    Peripheral neuropathy    Rosacea    Shingles    Sleep apnea    uses a cpap-with oxygen     Assessment:  Patient has history of falls and wants a life alert. SW t/c [facility] and provided [employer] as a service provider for life alert systems. SW t/c [employer] [phone] and quoted a one time fee of $162.86 for the equipment with no monthly fee. Patient will contact [employer] once she receives her October benefits to make payment. Patient reports she received the walker but did not receive instructions for the wheeler. SW assisted with navigating through patients emails, but the spam folder stores only past 30days. SW will contact [employer] at next visit to request email instructions sent to SW. Patient will also speak to provider for diaper order sent to [facility].    SDOH Interventions    Flowsheet Row Patient Outreach Telephone from 02/19/2024 in [place] POPULATION HEALTH DEPARTMENT Office Visit from 02/17/2024 in [facility] Patient Outreach Telephone from 01/22/2024 in [place] POPULATION HEALTH DEPARTMENT Patient Outreach Telephone from 01/15/2024 in [place] POPULATION HEALTH DEPARTMENT Patient Outreach Telephone from 01/09/2024 in [place] POPULATION HEALTH DEPARTMENT Clinical Support from 12/26/2023 in [facility]  SDOH Interventions        Food Insecurity Interventions -- -- -- Intervention Not Indicated Intervention Not Indicated AMB Referral  Housing Interventions -- -- -- Intervention Not Indicated Intervention Not Indicated Intervention Not Indicated  Transportation Interventions -- -- -- Intervention Not Indicated, Patient Resources (Friends/Family), Other (Comment) -- Intervention Not Indicated  Utilities Interventions -- -- -- Intervention Not Indicated Intervention Not Indicated Intervention Not Indicated  Alcohol  Usage Interventions -- -- -- -- -- Intervention Not Indicated (Score <7)  Depression Interventions/Treatment  Currently on Treatment Currently on Treatment Medication -- Medication, Referral to Psychiatry, Currently on Treatment, [employer] Referral Currently on [employer] Strain Interventions -- -- -- Intervention Not Indicated -- [employer] Provided, Other (Comment)  [referral placed]  Physical Activity Interventions -- -- -- -- -- Intervention Not Indicated  Stress Interventions -- -- -- -- -- Other (Comment)  [patient is  states she is a worry wart she is also worried about everything that is going on with her health. sometimes feels unheard by her providers.]  Social Connections Interventions -- -- -- -- Intervention Not Indicated Intervention Not Indicated  Health Literacy Interventions -- -- -- -- -- Patient declines to  respond      Recommendation:   Patient will speak to provider next week during visit for incontinence supplies.  Follow Up Plan:   Telephone follow up appointment date/time:  03/02/24 at 3pm  Tillman Gardener, BSW [place]  [facility], [facility] Social Worker Direct Dial : [phone]  Fax: [phone] Website: [email or web address]

## 2024-03-02 ENCOUNTER — Other Ambulatory Visit: Payer: Self-pay

## 2024-03-02 ENCOUNTER — Telehealth: Payer: Self-pay

## 2024-03-02 ENCOUNTER — Other Ambulatory Visit: Payer: Self-pay | Admitting: Family Medicine

## 2024-03-02 DIAGNOSIS — R42 Dizziness and giddiness: Secondary | ICD-10-CM | POA: Diagnosis not present

## 2024-03-02 DIAGNOSIS — E782 Mixed hyperlipidemia: Secondary | ICD-10-CM

## 2024-03-02 NOTE — Patient Outreach (Signed)
 Complex Care Management   Visit Note  03/02/2024  Name:  Cynthia Deleon MRN: [id number] DOB: 08-10-1955  Situation: Referral received for Complex Care Management related to SDOH Barriers:  Wheelchair instructions I obtained verbal consent from Patient.  Visit completed with Patient  on the phone  Background:   Past Medical History:  Diagnosis Date   Aortic atherosclerosis 10/31/2021   Arthritis    hands and knees   Bipolar 1 disorder (HCC)    Borderline diabetic    Chronic back pain    Chronic neck pain    Complication of anesthesia    high anxiety-does not want to be alone   COPD (chronic obstructive pulmonary disease) (HCC)    Current use of estrogen therapy 01/12/2014   Depression    Diabetes mellitus without complication (HCC)    Diplopia    Family history of adverse reaction to anesthesia    sister gas in lungs   GERD (gastroesophageal reflux disease)    Headache    Hepatic steatosis 10/31/2021   Hot flashes 12/15/2013   Hypertension    IBS (irritable bowel syndrome)    Incontinence    Kidney stone    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HCC)    Osteopenia 02/06/2022   Panic attacks    Peptic ulcer    Peripheral neuropathy    Rosacea    Shingles    Sleep apnea    uses a cpap-with oxygen     Assessment:  Patient reports she has not received the instructions for the wheelchair.  SW t/c ADAPT and spoke to staff.  The manual is emailed directly to SW.  SW emails patient the manual, but she is unable to receive or locate the email.  SW worked with  patient on several different options to locate the information in her email.  Patient was able to view the link to the manual.  Goal achieved.     SDOH Interventions    Flowsheet Row Patient Outreach Telephone from 02/19/2024 in [place] POPULATION HEALTH DEPARTMENT Office Visit from 02/17/2024 in [facility] Patient Outreach Telephone from 01/22/2024 in [place] POPULATION HEALTH  DEPARTMENT Patient Outreach Telephone from 01/15/2024 in [place] POPULATION HEALTH DEPARTMENT Patient Outreach Telephone from 01/09/2024 in [place] POPULATION HEALTH DEPARTMENT Clinical Support from 12/26/2023 in [facility]  SDOH Interventions        Food Insecurity Interventions -- -- -- Intervention Not Indicated Intervention Not Indicated AMB Referral  Housing Interventions -- -- -- Intervention Not Indicated Intervention Not Indicated Intervention Not Indicated  Transportation Interventions -- -- -- Intervention Not Indicated, Patient Resources (Friends/Family), Other (Comment) -- Intervention Not Indicated  Utilities Interventions -- -- -- Intervention Not Indicated Intervention Not Indicated Intervention Not Indicated  Alcohol  Usage Interventions -- -- -- -- -- Intervention Not Indicated (Score <7)  Depression Interventions/Treatment  Currently on Treatment Currently on Treatment Medication -- Medication, Referral to Psychiatry, Currently on Treatment, [employer] Referral Currently on Chiropractor Strain Interventions -- -- -- Intervention Not Indicated -- [employer] Provided, Other (Comment)  [referral placed]  Physical Activity Interventions -- -- -- -- -- Intervention Not Indicated  Stress Interventions -- -- -- -- -- Other (Comment)  [patient is states she is a worry wart she is also worried about everything that is going on with her health. sometimes feels unheard by her providers.]  Social Connections Interventions -- -- -- -- Intervention Not Indicated Intervention Not Indicated  Health Literacy Interventions -- -- -- -- -- Patient declines to respond      Recommendation:   None  Follow Up Plan:   Patient has met all care management goals. Care Management case will be closed. Patient has been provided contact information should new needs arise.   Tillman Gardener, BSW Hagerman  [facility], [facility] Social Worker Direct Dial : [phone]  Fax: [phone] Website: [email or web address]

## 2024-03-02 NOTE — Telephone Encounter (Signed)
 Fyi, appt tomorrow

## 2024-03-02 NOTE — Patient Instructions (Signed)

## 2024-03-02 NOTE — Telephone Encounter (Signed)
 Copied from CRM #[id number]. Topic: Clinical - Request for Lab/Test Order >> Mar 02, 2024 12:00 PM Kevelyn M wrote: Reason for CRM: Patient wanted to request lab orders for her Kidney, heart, and urine. The ambulance told her to request lab orders. Patient is still have UTI symptoms. She has an appointment tomorrow.

## 2024-03-03 ENCOUNTER — Ambulatory Visit (INDEPENDENT_AMBULATORY_CARE_PROVIDER_SITE_OTHER): Admitting: Family Medicine

## 2024-03-03 ENCOUNTER — Ambulatory Visit
Admission: RE | Admit: 2024-03-03 | Discharge: 2024-03-03 | Disposition: A | Discharge status: Home / Self Care | Source: Ambulatory Visit | Attending: Family Medicine

## 2024-03-03 ENCOUNTER — Ambulatory Visit: Payer: Self-pay | Admitting: Family Medicine

## 2024-03-03 ENCOUNTER — Encounter: Payer: Self-pay | Admitting: Family Medicine

## 2024-03-03 VITALS — BP 127/77 | HR 84 | Temp 97.0°F | Ht 69.0 in | Wt 169.9 lb

## 2024-03-03 DIAGNOSIS — Z8742 Personal history of other diseases of the female genital tract: Secondary | ICD-10-CM | POA: Diagnosis not present

## 2024-03-03 DIAGNOSIS — Z1231 Encounter for screening mammogram for malignant neoplasm of breast: Secondary | ICD-10-CM

## 2024-03-03 DIAGNOSIS — K219 Gastro-esophageal reflux disease without esophagitis: Secondary | ICD-10-CM | POA: Diagnosis not present

## 2024-03-03 DIAGNOSIS — R42 Dizziness and giddiness: Secondary | ICD-10-CM | POA: Diagnosis not present

## 2024-03-03 DIAGNOSIS — R300 Dysuria: Secondary | ICD-10-CM

## 2024-03-03 DIAGNOSIS — N3001 Acute cystitis with hematuria: Secondary | ICD-10-CM | POA: Diagnosis not present

## 2024-03-03 LAB — URINALYSIS, ROUTINE W REFLEX MICROSCOPIC
Bilirubin, UA: NEGATIVE
Glucose, UA: NEGATIVE
Ketones, UA: NEGATIVE
Nitrite, UA: POSITIVE — AB
Protein,UA: NEGATIVE
Specific Gravity, UA: 1.02 (ref 1.005–1.030)
Urobilinogen, Ur: 0.2 mg/dL (ref 0.2–1.0)
pH, UA: 6.5 (ref 5.0–7.5)

## 2024-03-03 LAB — MICROSCOPIC EXAMINATION
RBC, Urine: NONE SEEN /HPF (ref 0–2)
Renal Epithel, UA: NONE SEEN /HPF
WBC, UA: >30 /HPF — AB (ref 0–5)
Yeast, UA: NONE SEEN

## 2024-03-03 MED ORDER — CEPHALEXIN 500 MG PO CAPS: 500.0000 mg | ORAL_CAPSULE | Freq: Two times a day (BID) | ORAL | 0 refills | Status: AC

## 2024-03-03 MED ORDER — LANSOPRAZOLE 30 MG PO CPDR: 30.0000 mg | DELAYED_RELEASE_CAPSULE | Freq: Two times a day (BID) | ORAL | 0 refills | Status: DC

## 2024-03-03 MED ORDER — FLUCONAZOLE 150 MG PO TABS: 150.0000 mg | ORAL_TABLET | Freq: Once | ORAL | 0 refills | Status: AC

## 2024-03-03 NOTE — Progress Notes (Signed)
 Subjective:  [name], female    DOB: 1955-08-04, 68 y.o.   MRN: [id number]  Patient Care Team: Severa Rock HERO, FNP as PCP - General (Family Medicine) Tasia Lung, MD as Consulting Physician (Psychiatry) Milton Lade, MD as Consulting Physician (Neurology) Billee Mliss BIRCH, [facility] (Pharmacist) Darroll Anes, DO (Optometry) Hope Almarie ORN, NP as Nurse Practitioner (Pulmonary Disease) Porter Andrez SAUNDERS, PA-C (Inactive) (Dermatology) Bonner Ade, MD as Consulting Physician (Physical Medicine and Rehabilitation) O'Neal, Darryle Ned, MD as Consulting Physician (Cardiology) Signa Delon LABOR, NP as Nurse Practitioner (Obstetrics and Gynecology) Bertrum Rosina HERO, RN as [facility] Care Management   Chief Complaint:  Dysuria (X 1 week )   HPI: Cynthia Deleon is a 68 y.o. female presenting on 03/03/2024 for Dysuria (X 1 week )   Cynthia Deleon is a 68 year old female who presents with lower back pain and urinary symptoms.  She has persistent lower back pain described as 'about to kill me.' The pain has been ongoing for a long time. She experiences urinary symptoms including a burning sensation during urination and increased frequency. No fever, chills, confusion, or blood in the urine, although she mentions a 'tag' coming out. She was previously diagnosed with a urinary tract infection and was prescribed doxycycline , which she could not tolerate due to an allergic reaction. She has not been prescribed an alternative antibiotic since then.  She has a history of multiple drug allergies including sulfur, amoxicillin , and doxycycline . She is currently taking omeprazole  and Prevacid  for acid reflux.          Relevant past medical, surgical, family, and social history reviewed and updated as indicated.  Allergies and medications reviewed and updated. Data reviewed: Chart in Epic.   Past Medical History:  Diagnosis Date   Aortic atherosclerosis 10/31/2021    Arthritis    hands and knees   Bipolar 1 disorder (HCC)    Borderline diabetic    Chronic back pain    Chronic neck pain    Complication of anesthesia    high anxiety-does not want to be alone   COPD (chronic obstructive pulmonary disease) (HCC)    Current use of estrogen therapy 01/12/2014   Depression    Diabetes mellitus without complication (HCC)    Diplopia    Family history of adverse reaction to anesthesia    sister gas in lungs   GERD (gastroesophageal reflux disease)    Headache    Hepatic steatosis 10/31/2021   Hot flashes 12/15/2013   Hypertension    IBS (irritable bowel syndrome)    Incontinence    Kidney stone    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HCC)    Osteopenia 02/06/2022   Panic attacks    Peptic ulcer    Peripheral neuropathy    Rosacea    Shingles    Sleep apnea    uses a cpap-with oxygen     Past Surgical History:  Procedure Laterality Date   BIOPSY  04/27/2020   Procedure: BIOPSY;  Surgeon: Donnald Charleston, MD;  Location: [facility];  Service: Endoscopy;;   BUNIONECTOMY WITH HAMMERTOE RECONSTRUCTION Right 12/10/2012   Procedure: RIGHT FIRST METATARSAL CHEVRON BUNION CORRECTION,  2 AND 3 HAMMERTOE CORRECTION , RIGHT 3 AND 4 TOE NAIL EXCISION ;  Surgeon: Norleen Armor, MD;  Location: [facility];  Service: Orthopedics;  Laterality: Right;   COLONOSCOPY WITH PROPOFOL  N/A 04/27/2020   Procedure: COLONOSCOPY WITH PROPOFOL ;  Surgeon: Donnald Charleston, MD;  Location: [facility];  Service: Endoscopy;  Laterality: N/A;   COLONOSCOPY WITH PROPOFOL  N/A 06/17/2023   Procedure: COLONOSCOPY WITH PROPOFOL ;  Surgeon: Eartha Angelia Sieving, MD;  Location: [facility];  Service: Gastroenterology;  Laterality: N/A;  2:30PM;ASA 3   FOOT ARTHRODESIS  2000   both feet   LIPOSUCTION  03/2018   POLYPECTOMY  06/17/2023   Procedure: POLYPECTOMY;  Surgeon: Eartha Angelia Sieving, MD;  Location: [facility];  Service: Gastroenterology;;   RECTAL  SURGERY     TONSILLECTOMY     TOTAL ABDOMINAL HYSTERECTOMY      Social History   Socioeconomic History   Marital status: Married    Spouse name: Not on file   Number of children: 5   Years of education: 9th   Highest education level: Not on file  Occupational History   Occupation: Disabled  Tobacco Use   Smoking status: Former    Current packs/day: 0.25    Average packs/day: 1 pack/day for 43.7 years (42.4 ttl pk-yrs)    Types: Cigarettes    Start date: 06/11/1975    Quit date: 06/10/2017   Smokeless tobacco: Never  Vaping Use   Vaping status: Never Used  Substance and Sexual Activity   Alcohol  use: No    Alcohol /week: 0.0 standard drinks of alcohol    Drug use: No   Sexual activity: Not Currently    Birth control/protection: Surgical    Comment: hyst  Other Topics Concern   Not on file  Social History Narrative   Lives at home with home with her son (has five children).   Right-handed.   1 cup caffeine per day.   Social Drivers of Corporate investment banker Strain: Low Risk  (01/15/2024)   Overall Financial Resource Strain (CARDIA)    Difficulty of Paying Living Expenses: Not hard at all  Recent Concern: Financial Resource Strain - High Risk (12/26/2023)   Overall Financial Resource Strain (CARDIA)    Difficulty of Paying Living Expenses: Hard  Food Insecurity: No Food Insecurity (01/15/2024)   Hunger Vital Sign    Worried About Running Out of Food in the Last Year: Never true    Ran Out of Food in the Last Year: Never true  Recent Concern: Food Insecurity - Food Insecurity Present (12/26/2023)   Hunger Vital Sign    Worried About Running Out of Food in the Last Year: Often true    Ran Out of Food in the Last Year: Sometimes true  Transportation Needs: Unmet Transportation Needs (01/15/2024)   PRAPARE - Administrator, Civil Service (Medical): Yes    Lack of Transportation (Non-Medical): No  Physical Activity: Sufficiently Active (12/26/2023)   Exercise  Vital Sign    Days of Exercise per Week: 7 days    Minutes of Exercise per Session: 30 min  Stress: Stress Concern Present (12/26/2023)   [employer] of Occupational Health - Occupational Stress Questionnaire    Feeling of Stress: Rather much  Social Connections: Moderately Isolated (01/09/2024)   Social Connection and Isolation Panel    Frequency of Communication with Friends and Family: Three times a week    Frequency of Social Gatherings with Friends and Family: Twice a week    Attends Religious Services: More than 4 times per year    Active Member of [employer] or Organizations: No    Attends Banker Meetings: Never    Marital Status: Divorced  Catering manager Violence: Not At Risk (01/15/2024)   Humiliation, Afraid, Rape,  and Kick questionnaire    Fear of Current or Ex-Partner: No    Emotionally Abused: No    Physically Abused: No    Sexually Abused: No    Outpatient Encounter Medications as of 03/03/2024  Medication Sig   albuterol  (VENTOLIN  HFA) 108 (90 Base) MCG/ACT inhaler Inhale 1-2 puffs into the lungs every 6 (six) hours as needed for wheezing or shortness of breath.   ARIPiprazole  (ABILIFY ) 10 MG tablet Take 10 mg by mouth every morning.   Artificial Saliva (BIOTENE DRY MOUTH MOISTURIZING) SOLN Use as directed 5 mLs in the mouth or throat in the morning and at bedtime.   aspirin EC 81 MG tablet Take 81 mg by mouth daily. Swallow whole.   atorvastatin  (LIPITOR) 80 MG tablet Take 1 tablet (80 mg total) by mouth daily.   cephALEXin  (KEFLEX ) 500 MG capsule Take 1 capsule (500 mg total) by mouth 2 (two) times daily for 7 days.   cetirizine  (ZYRTEC ) 10 MG tablet Take 1 tablet (10 mg total) by mouth daily.   cholecalciferol  (VITAMIN D ) 25 MCG tablet Take 1 tablet (1,000 Units total) by mouth daily.   Continuous Glucose Receiver (FREESTYLE LIBRE 3 READER) DEVI with compatible Freestyle Libre 3 sensor to monitor glucose continuously. DX: E11.65   Continuous Glucose  Sensor (FREESTYLE LIBRE 3 PLUS SENSOR) MISC Change sensor every 15 days. DX: E11.65   diazepam  (VALIUM ) 5 MG tablet Take 5 mg by mouth 2 (two) times daily.   DULoxetine  (CYMBALTA ) 60 MG capsule TAKE (1) CAPSULE BY MOUTH EVERY DAY.   estrogens , conjugated, (PREMARIN ) 0.3 MG tablet TAKE (1) TABLET BY MOUTH ONCE DAILY.   ezetimibe  (ZETIA ) 10 MG tablet TAKE (1) TABLET BY MOUTH ONCE DAILY.   fluconazole  (DIFLUCAN ) 150 MG tablet Take 1 tablet (150 mg total) by mouth once for 1 dose.   fluorometholone (FML) 0.1 % ophthalmic suspension SMARTSIG:In Eye(s)   fluticasone  (FLONASE ) 50 MCG/ACT nasal spray Place 2 sprays into both nostrils daily.   hyoscyamine  (LEVSIN  SL) 0.125 MG SL tablet Place 1 tablet (0.125 mg total) under the tongue every 8 (eight) hours as needed (abdominal pain).   insulin  lispro (HUMALOG  KWIKPEN) 100 UNIT/ML KwikPen Use 5 units 2 times daily with breakfast and dinner when blood sugar is >200 on steroids   Insulin  Pen Needle (PIP PEN NEEDLES 32G X ) 32G X 4 MM MISC Use to inject insulin  2 times daily as needed. DX E11.65   lamoTRIgine  (LAMICTAL ) 200 MG tablet Take 200 mg by mouth at bedtime. For mood disorder.   lisinopril  (ZESTRIL ) 5 MG tablet Take 1 tablet (5 mg total) by mouth daily.   meclizine  (ANTIVERT ) 25 MG tablet Take 2 tablets (50 mg total) by mouth daily as needed for dizziness.   montelukast  (SINGULAIR ) 10 MG tablet TAKE (1) TABLET BY MOUTH AT BEDTIME.   MYRBETRIQ  50 MG TB24 tablet Take 1 tablet (50 mg total) by mouth daily.   omeprazole  (PRILOSEC) 40 MG capsule Take 1 capsule (40 mg total) by mouth daily.   OneTouch Delica Lancets 30G MISC Use to test blood sugar twice daily. DX: E11.9   oxyCODONE  (ROXICODONE ) 15 MG immediate release tablet Take 1 tablet (15 mg total) by mouth every 6 (six) hours as needed for pain.   pregabalin  (LYRICA ) 100 MG capsule Take 2 capsules by mouth 3 (three) times daily.   promethazine  (PHENERGAN ) 12.5 MG tablet Take 1 tablet (12.5 mg total)  by mouth every 8 (eight) hours as needed for nausea or vomiting.  tirzepatide  (MOUNJARO ) 15 MG/0.5ML Pen Inject 15 mg into the skin once a week. DX: E11.9   triamcinolone  cream (KENALOG ) 0.1 % Apply 1 Application topically 2 (two) times daily.   umeclidinium-vilanterol (ANORO ELLIPTA ) 62.5-25 MCG/ACT AEPB Inhale 1 puff into the lungs daily.   valACYclovir  (VALTREX ) 1000 MG tablet Take 1 tablet (1,000 mg total) by mouth daily. Please put in dispill   vitamin B-12 1000 MCG tablet Take 1 tablet (1,000 mcg total) by mouth daily.   VRAYLAR 3 MG capsule Take 3 mg by mouth at bedtime.   [DISCONTINUED] lansoprazole  (PREVACID ) 30 MG capsule TAKE (1) CAPSULE BY MOUTH ONCE DAILY.   lansoprazole  (PREVACID ) 30 MG capsule Take 1 capsule (30 mg total) by mouth 2 (two) times daily before a meal.   [DISCONTINUED] doxycycline  (VIBRA -TABS) 100 MG tablet Take 1 tablet (100 mg total) by mouth 2 (two) times daily. 1 po bid   [DISCONTINUED] fluconazole  (DIFLUCAN ) 150 MG tablet 1 po q week x 4 weeks   No facility-administered encounter medications on file as of 03/03/2024.    Allergies  Allergen Reactions   Divalproex Sodium Other (See Comments)    Hallucinations   Other reaction(s): Confusion  Hallucinations     Other reaction(s): Confusion    Hallucinations   Other Anaphylaxis, Other (See Comments), Nausea And Vomiting and Nausea Only   Pseudoeph-Hydrocodone -Gg Nausea Only   Sulfa Antibiotics Anaphylaxis, Nausea Only, Other (See Comments) and Swelling    Swelling of tongue.   Valproic Acid Other (See Comments)   Varenicline Other (See Comments)    Altered mental status-Per patient was put on allergy list by Dr Todd Cliche bu patient staes she is not allergic to this and is currently taking it.   Varenicline Tartrate Other (See Comments)    Other reaction(s): Confusion   Codeine Nausea And Vomiting and Other (See Comments)    Other reaction(s): vomiting   Hydrocodone  Nausea And Vomiting and Other (See  Comments)   Acetaminophen  Nausea Only   Ambien  [Zolpidem ] Other (See Comments)    Causes sleep walking   Clavulanic Acid Diarrhea   Elemental Sulfur Other (See Comments)    Swelling of tongue   Hydrocod Poli-Chlorphe Poli Er Other (See Comments)    Other reaction(s): Skin itches    Macrobid  [Nitrofurantoin ] Nausea And Vomiting    Dizziness, nausea, vomiting   Paxil [Paroxetine Hcl] Other (See Comments)    Causes ringing in the ears.    Prednisone  Other (See Comments)    Other reaction(s): Unknown    Sulfur Other (See Comments)    sulfur   Amoxicillin  Rash    Has patient had a PCN reaction causing immediate rash, facial/tongue/throat swelling, SOB or lightheadedness with hypotension: No Has patient had a PCN reaction causing severe rash involving mucus membranes or skin necrosis: No Has patient had a PCN reaction that required hospitalization: No Has patient had a PCN reaction occurring within the last 10 years: Yes If all of the above answers are NO, then may proceed with Cephalosporin use.    Farxiga  [Dapagliflozin ]     RECURRENT YEAST/UTI   Metformin  And Related Diarrhea   Tape Rash    Pertinent ROS per HPI, otherwise unremarkable      Objective:  BP 127/77   Pulse 84   Temp (!) 97 F (36.1 C)   Ht 5' 9 (1.753 m)   Wt 169 lb 14.4 oz (77.1 kg)   SpO2 94%   BMI 25.09 kg/m    Wt Readings  from Last 3 Encounters:  03/03/24 169 lb 14.4 oz (77.1 kg)  02/17/24 170 lb (77.1 kg)  01/30/24 169 lb 9.6 oz (76.9 kg)    Physical Exam Vitals and nursing note reviewed.  Constitutional:      General: She is not in acute distress.    Appearance: Normal appearance. She is not ill-appearing, toxic-appearing or diaphoretic.  HENT:     Head: Normocephalic and atraumatic.     Mouth/Throat:     Mouth: Mucous membranes are moist.  Eyes:     Pupils: Pupils are equal, round, and reactive to light.  Cardiovascular:     Rate and Rhythm: Normal rate and regular rhythm.      Heart sounds: Normal heart sounds.  Pulmonary:     Effort: Pulmonary effort is normal.     Breath sounds: Normal breath sounds.  Abdominal:     General: Bowel sounds are normal.     Palpations: Abdomen is soft.     Tenderness: There is no abdominal tenderness. There is no right CVA tenderness or left CVA tenderness.  Musculoskeletal:     Right lower leg: No edema.     Left lower leg: No edema.  Skin:    General: Skin is warm and dry.     Capillary Refill: Capillary refill takes less than 2 seconds.  Neurological:     General: No focal deficit present.     Mental Status: She is alert and oriented to person, place, and time.  Psychiatric:        Mood and Affect: Mood normal.        Behavior: Behavior normal.        Thought Content: Thought content normal.        Judgment: Judgment normal.     Results for orders placed or performed in visit on 02/17/24  Microscopic Examination   Collection Time: 02/17/24  9:23 AM   Urine  Result Value Ref Range   WBC, UA 0-5 0 - 5 /hpf   RBC, Urine None seen 0 - 2 /hpf   Epithelial Cells (non renal) 0-10 0 - 10 /hpf   Renal Epithel, UA None seen None seen /hpf   Bacteria, UA Many (A) None seen/Few   Yeast, UA None seen None seen  Urinalysis, Routine w reflex microscopic   Collection Time: 02/17/24  9:23 AM  Result Value Ref Range   Specific Gravity, UA 1.025 1.005 - 1.030   pH, UA 5.5 5.0 - 7.5   Color, UA Yellow Yellow   Appearance Ur Clear Clear   Leukocytes,UA Negative Negative   Protein,UA Negative Negative/Trace   Glucose, UA Negative Negative   Ketones, UA Negative Negative   RBC, UA Negative Negative   Bilirubin, UA Negative Negative   Urobilinogen, Ur 0.2 0.2 - 1.0 mg/dL   Nitrite, UA Positive (A) Negative   Microscopic Examination See below:   Urine Culture   Collection Time: 02/17/24 10:00 AM   Specimen: Urine   UR  Result Value Ref Range   Urine Culture, Routine Final report    Organism ID, Bacteria Comment         Pertinent labs & imaging results that were available during my care of the patient were reviewed by me and considered in my medical decision making.  Assessment & Plan:  Naz was seen today for dysuria.  Diagnoses and all orders for this visit:  Acute cystitis with hematuria -     cephALEXin  (KEFLEX ) 500 MG capsule; Take  1 capsule (500 mg total) by mouth 2 (two) times daily for 7 days.  Dysuria -     Urine Culture -     Urinalysis, Routine w reflex microscopic  History of vaginitis -     fluconazole  (DIFLUCAN ) 150 MG tablet; Take 1 tablet (150 mg total) by mouth once for 1 dose.  Gastroesophageal reflux disease without esophagitis -     lansoprazole  (PREVACID ) 30 MG capsule; Take 1 capsule (30 mg total) by mouth 2 (two) times daily before a meal.       Urinary tract infection with dysuria Persistent urinary tract infection with dysuria, frequency, and positive nitrites and leukocytes on urinalysis. Previous allergic reaction to doxycycline . Culture pending to confirm antibiotic sensitivity. - Prescribe Keflex  (cefalexin) twice a day with food for urinary tract infection - Perform urine culture to confirm antibiotic sensitivity - Adjust antibiotic treatment based on culture results if necessary  Candidiasis, history of with antibiotic use  Yeast infection - Prescribe Diflucan  for yeast infection  Gastroesophageal reflux disease Gastroesophageal reflux disease with symptoms of acid reflux. Currently on omeprazole  and Prevacid . Symptoms may be exacerbated by Mounjaro  (tirzepatide ). - Refill Prevacid  and instruct to take twice a day with food      Continue all other maintenance medications.  Follow up plan: Return if symptoms worsen or fail to improve.   Continue healthy lifestyle choices, including diet (rich in fruits, vegetables, and lean proteins, and low in salt and simple carbohydrates) and exercise (at least 30 minutes of moderate physical activity  daily).   The above assessment and management plan was discussed with the patient. The patient verbalized understanding of and has agreed to the management plan. Patient is aware to call the clinic if they develop any new symptoms or if symptoms persist or worsen. Patient is aware when to return to the clinic for a follow-up visit. Patient educated on when it is appropriate to go to the emergency department.   Rosaline Bruns, FNP-C [facility] [phone]

## 2024-03-07 ENCOUNTER — Ambulatory Visit: Payer: Self-pay | Admitting: Family Medicine

## 2024-03-07 LAB — URINE CULTURE

## 2024-03-08 ENCOUNTER — Telehealth: Payer: Self-pay | Admitting: Pharmacist

## 2024-03-08 DIAGNOSIS — E1159 Type 2 diabetes mellitus with other circulatory complications: Secondary | ICD-10-CM

## 2024-03-08 DIAGNOSIS — E1169 Type 2 diabetes mellitus with other specified complication: Secondary | ICD-10-CM

## 2024-03-08 MED ORDER — LISINOPRIL 5 MG PO TABS: 5.0000 mg | ORAL_TABLET | Freq: Every day | ORAL | 5 refills | Status: AC

## 2024-03-08 NOTE — Telephone Encounter (Signed)
   This patient is appearing on a report for being at risk of failing the adherence measure for hypertension (ACEi/ARB) medications this calendar year.   Medication: lisinopril  --patient on pill packs--can only fill 30 days at a time Last fill date: 03/02/24 for 30 day supply  Contacted pharmacy to facilitate refills. and Will collaborate with provider to facilitate refill needs.   Mika Griffitts Dattero Teigan Sahli, PharmD, BCACP, CPP Clinical Pharmacist, [facility]

## 2024-03-11 ENCOUNTER — Ambulatory Visit (HOSPITAL_COMMUNITY): Admission: RE | Admit: 2024-03-11 | Source: Ambulatory Visit

## 2024-03-11 ENCOUNTER — Encounter (INDEPENDENT_AMBULATORY_CARE_PROVIDER_SITE_OTHER): Payer: Self-pay | Admitting: Gastroenterology

## 2024-03-11 ENCOUNTER — Ambulatory Visit (INDEPENDENT_AMBULATORY_CARE_PROVIDER_SITE_OTHER): Admitting: Gastroenterology

## 2024-03-11 VITALS — BP 93/58 | HR 102 | Temp 97.1°F | Ht 69.0 in | Wt 166.8 lb

## 2024-03-11 DIAGNOSIS — K580 Irritable bowel syndrome with diarrhea: Secondary | ICD-10-CM | POA: Diagnosis not present

## 2024-03-11 DIAGNOSIS — K219 Gastro-esophageal reflux disease without esophagitis: Secondary | ICD-10-CM

## 2024-03-11 DIAGNOSIS — R159 Full incontinence of feces: Secondary | ICD-10-CM | POA: Diagnosis not present

## 2024-03-11 MED ORDER — HYOSCYAMINE SULFATE 0.125 MG SL SUBL: 0.1250 mg | SUBLINGUAL_TABLET | Freq: Three times a day (TID) | SUBLINGUAL | 2 refills | Status: AC | PRN

## 2024-03-11 NOTE — Patient Instructions (Signed)
 I have resent levsin  to take up to every 8 hours as needed for abdominal pain  You can start over the counter metamucil twice daily to help bulk up the stools  Please make sure you are only taking prilosec and not taking prevacid  with this as these are the same type of medication  If you decide you want to do the testing of the muscles in your rectum (anorectal manometry) at [facility], please let me know  Follow up 1 year  It was a pleasure to see you today. I want to create trusting relationships with patients and provide genuine, compassionate, and quality care. I truly value your feedback! please be on the lookout for a survey regarding your visit with me today. I appreciate your input about our visit and your time in completing this!    Niaja Stickley L. Bane Hagy, MSN, APRN, AGNP-C Adult-Gerontology Nurse Practitioner [facility] at [facility]

## 2024-03-11 NOTE — Progress Notes (Addendum)
 Referring Provider: Severa Rock HERO, FNP Primary Care Physician:  Severa Rock HERO, FNP Primary GI Physician: Dr. Eartha   Chief Complaint  Patient presents with   Follow-up    Pt arrives for follow up. Pt states she is incontinent and is needing medium pull up. Otherwise doing ok.    HPI:   Cynthia Deleon is a 68 y.o. female with past medical history of IBS, bipolar disorder, chronic back pain, depression, diabetes, COPD, GERD, hypertension, panic attacks, sleep apnea, incontinence   Patient presenting today for:  Follow up of IBS, GERD, fecal incontinence  Last seen November 2024, at that time had onset of diffuse abdominal pain, watery diarrhea. Seen in the ED prior to OV with CT showing non specific colitis. Reported fecal incontinence for atleast 40 years.   Recommended bentyl  q12h PRN and schedule colonoscopy  Most recent labs: normal CBC in july CMP in may with normal LFTs/renal function  Present:  Reports chronic incontinence. Reports history of IBS, Has abdominal pain intermittently in lower abdomen that feels like spasms. Notes that bentyl  did not provide any results. She reports that she can start with a normal BM that will transition to diarrhea. Has 4-5 BMs per day. Notably she has levsin  listed in her med list but unclear if she is taking this but she would like to try this. She notes she takes imodium  PRN for diarrhea, takes this when diarrhea is severe. She reports diarrhea for many years. No rectal bleeding or melena. Appetite is good, weight stable.   She also has prevacid  and prilosec listed, states she is taking only prilosec which keeps things well controlled. No dysphagia or odynophagia.    Last Colonoscopy:06/2023- One 1 mm polyp in the cecum, removed with a cold                            biopsy forceps. Resected and retrieved.                           - One 1 mm polyp in the ascending colon, removed                            with a cold biopsy forceps.  Resected and retrieved.                           - Two 3 to 4 mm polyps in the transverse colon and                            in the ascending colon, removed with a cold snare.                            Resected and retrieved.                           - Diverticulosis in the sigmoid colon, in the                            descending colon and in the ascending colon.                           -  Tortuous colon.                           - The distal rectum and anal verge are normal on                            retroflexion view.  A. COLON, CECAL, ASCENDING, TRANSVERSE, POLYPECTOMY:  - Tubular adenoma(s) without high-grade dysplasia or malignancy  - Hyperplastic polyp  - Other fragments of polypoid colonic mucosa with no specific  histopathologic changes  Repeat in 5 years   Filed Weights   03/11/24 0815  Weight: 166 lb 12.8 oz (75.7 kg)     Past Medical History:  Diagnosis Date   Aortic atherosclerosis 10/31/2021   Arthritis    hands and knees   Bipolar 1 disorder (HCC)    Borderline diabetic    Chronic back pain    Chronic neck pain    Complication of anesthesia    high anxiety-does not want to be alone   COPD (chronic obstructive pulmonary disease) (HCC)    Current use of estrogen therapy 01/12/2014   Depression    Diabetes mellitus without complication (HCC)    Diplopia    Family history of adverse reaction to anesthesia    sister gas in lungs   GERD (gastroesophageal reflux disease)    Headache    Hepatic steatosis 10/31/2021   Hot flashes 12/15/2013   Hypertension    IBS (irritable bowel syndrome)    Incontinence    Kidney stone    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HCC)    Osteopenia 02/06/2022   Panic attacks    Peptic ulcer    Peripheral neuropathy    Rosacea    Shingles    Sleep apnea    uses a cpap-with oxygen     Past Surgical History:  Procedure Laterality Date   BIOPSY  04/27/2020   Procedure: BIOPSY;  Surgeon: Donnald Charleston, MD;   Location: [facility];  Service: Endoscopy;;   BUNIONECTOMY WITH HAMMERTOE RECONSTRUCTION Right 12/10/2012   Procedure: RIGHT FIRST METATARSAL CHEVRON BUNION CORRECTION,  2 AND 3 HAMMERTOE CORRECTION , RIGHT 3 AND 4 TOE NAIL EXCISION ;  Surgeon: Norleen Armor, MD;  Location: [facility];  Service: Orthopedics;  Laterality: Right;   COLONOSCOPY WITH PROPOFOL  N/A 04/27/2020   Procedure: COLONOSCOPY WITH PROPOFOL ;  Surgeon: Donnald Charleston, MD;  Location: [facility];  Service: Endoscopy;  Laterality: N/A;   COLONOSCOPY WITH PROPOFOL  N/A 06/17/2023   Procedure: COLONOSCOPY WITH PROPOFOL ;  Surgeon: Eartha Angelia Sieving, MD;  Location: [facility];  Service: Gastroenterology;  Laterality: N/A;  2:30PM;ASA 3   FOOT ARTHRODESIS  2000   both feet   LIPOSUCTION  03/2018   POLYPECTOMY  06/17/2023   Procedure: POLYPECTOMY;  Surgeon: Eartha Angelia, Sieving, MD;  Location: [facility];  Service: Gastroenterology;;   RECTAL SURGERY     TONSILLECTOMY     TOTAL ABDOMINAL HYSTERECTOMY      Current Outpatient Medications  Medication Sig Dispense Refill   albuterol  (VENTOLIN  HFA) 108 (90 Base) MCG/ACT inhaler Inhale 1-2 puffs into the lungs every 6 (six) hours as needed for wheezing or shortness of breath. 8.5 g 3   ARIPiprazole  (ABILIFY ) 10 MG tablet Take 10 mg by mouth every morning.     Artificial Saliva (BIOTENE DRY MOUTH MOISTURIZING) SOLN Use as directed 5 mLs in the mouth or throat in the morning and at  bedtime. 44.3 mL 0   aspirin EC 81 MG tablet Take 81 mg by mouth daily. Swallow whole.     atorvastatin  (LIPITOR) 80 MG tablet Take 1 tablet (80 mg total) by mouth daily. 90 tablet 0   cetirizine  (ZYRTEC ) 10 MG tablet Take 1 tablet (10 mg total) by mouth daily. 90 tablet 1   cholecalciferol  (VITAMIN D ) 25 MCG tablet Take 1 tablet (1,000 Units total) by mouth daily.     Continuous Glucose Receiver (FREESTYLE LIBRE 3 READER) DEVI with compatible Freestyle Libre 3 sensor to monitor  glucose continuously. DX: E11.65 1 each 1   Continuous Glucose Sensor (FREESTYLE LIBRE 3 PLUS SENSOR) MISC Change sensor every 15 days. DX: E11.65 2 each 5   diazepam  (VALIUM ) 5 MG tablet Take 5 mg by mouth 2 (two) times daily.     DULoxetine  (CYMBALTA ) 60 MG capsule TAKE (1) CAPSULE BY MOUTH EVERY DAY. 30 capsule 0   estrogens , conjugated, (PREMARIN ) 0.3 MG tablet TAKE (1) TABLET BY MOUTH ONCE DAILY. 30 tablet 10   ezetimibe  (ZETIA ) 10 MG tablet TAKE (1) TABLET BY MOUTH ONCE DAILY. 30 tablet 0   fluorometholone (FML) 0.1 % ophthalmic suspension SMARTSIG:In Eye(s)     fluticasone  (FLONASE ) 50 MCG/ACT nasal spray Place 2 sprays into both nostrils daily. 16 g 0   hyoscyamine  (LEVSIN  SL) 0.125 MG SL tablet Place 1 tablet (0.125 mg total) under the tongue every 8 (eight) hours as needed (abdominal pain). 90 tablet 2   insulin  lispro (HUMALOG  KWIKPEN) 100 UNIT/ML KwikPen Use 5 units 2 times daily with breakfast and dinner when blood sugar is >200 on steroids 15 mL 11   Insulin  Pen Needle (PIP PEN NEEDLES 32G X ) 32G X 4 MM MISC Use to inject insulin  2 times daily as needed. DX E11.65 100 each 11   lamoTRIgine  (LAMICTAL ) 200 MG tablet Take 200 mg by mouth at bedtime. For mood disorder.     lansoprazole  (PREVACID ) 30 MG capsule Take 1 capsule (30 mg total) by mouth 2 (two) times daily before a meal. 180 capsule 0   lisinopril  (ZESTRIL ) 5 MG tablet Take 1 tablet (5 mg total) by mouth daily. 30 tablet 5   meclizine  (ANTIVERT ) 25 MG tablet Take 2 tablets (50 mg total) by mouth daily as needed for dizziness. 60 tablet 2   montelukast  (SINGULAIR ) 10 MG tablet TAKE (1) TABLET BY MOUTH AT BEDTIME. 90 tablet 3   MYRBETRIQ  50 MG TB24 tablet Take 1 tablet (50 mg total) by mouth daily. 180 tablet 1   omeprazole  (PRILOSEC) 40 MG capsule Take 1 capsule (40 mg total) by mouth daily. 90 capsule 3   OneTouch Delica Lancets 30G MISC Use to test blood sugar twice daily. DX: E11.9 100 each 3   oxyCODONE  (ROXICODONE ) 15  MG immediate release tablet Take 1 tablet (15 mg total) by mouth every 6 (six) hours as needed for pain. 10 tablet 0   pregabalin  (LYRICA ) 100 MG capsule Take 2 capsules by mouth 3 (three) times daily.     promethazine  (PHENERGAN ) 12.5 MG tablet Take 1 tablet (12.5 mg total) by mouth every 8 (eight) hours as needed for nausea or vomiting. 20 tablet 3   tirzepatide  (MOUNJARO ) 15 MG/0.5ML Pen Inject 15 mg into the skin once a week. DX: E11.9 6 mL 0   triamcinolone  cream (KENALOG ) 0.1 % Apply 1 Application topically 2 (two) times daily. 30 g 0   umeclidinium-vilanterol (ANORO ELLIPTA ) 62.5-25 MCG/ACT AEPB Inhale 1 puff into the  lungs daily. 60 each 11   valACYclovir  (VALTREX ) 1000 MG tablet Take 1 tablet (1,000 mg total) by mouth daily. Please put in dispill 30 tablet 12   vitamin B-12 1000 MCG tablet Take 1 tablet (1,000 mcg total) by mouth daily.     VRAYLAR 3 MG capsule Take 3 mg by mouth at bedtime.     No current facility-administered medications for this visit.    Allergies as of 03/11/2024 - Review Complete 03/11/2024  Allergen Reaction Noted   Divalproex sodium Other (See Comments) 08/02/2011   Other Anaphylaxis, Other (See Comments), Nausea And Vomiting, and Nausea Only 01/20/2019   Pseudoeph-hydrocodone -gg Nausea Only 02/05/2018   Sulfa antibiotics Anaphylaxis, Nausea Only, Other (See Comments), and Swelling 04/22/2018   Valproic acid Other (See Comments) 08/22/2022   Varenicline Other (See Comments) 08/27/2013   Varenicline tartrate Other (See Comments) 02/05/2018   Codeine Nausea And Vomiting and Other (See Comments) 01/18/2011   Hydrocodone  Nausea And Vomiting and Other (See Comments) 11/02/2013   Acetaminophen  Nausea Only 08/12/2023   Ambien  [zolpidem ] Other (See Comments) 11/12/2015   Clavulanic acid Diarrhea 01/20/2019   Elemental sulfur Other (See Comments) 08/02/2011   Hydrocod poli-chlorphe poli er Other (See Comments) 05/02/2020   Macrobid  [nitrofurantoin ] Nausea And  Vomiting 08/14/2020   Paxil [paroxetine hcl] Other (See Comments) 08/01/2013   Prednisone  Other (See Comments) 05/02/2020   Sulfur Other (See Comments) 08/02/2011   Amoxicillin  Rash 04/17/2016   Farxiga  [dapagliflozin ]  01/02/2021   Metformin  and related Diarrhea 08/09/2020   Tape Rash 05/14/2018    Social History   Socioeconomic History   Marital status: Married    Spouse name: Not on file   Number of children: 5   Years of education: 9th   Highest education level: Not on file  Occupational History   Occupation: Disabled  Tobacco Use   Smoking status: Former    Current packs/day: 0.25    Average packs/day: 1 pack/day for 43.8 years (42.4 ttl pk-yrs)    Types: Cigarettes    Start date: 06/11/1975    Quit date: 06/10/2017   Smokeless tobacco: Never  Vaping Use   Vaping status: Never Used  Substance and Sexual Activity   Alcohol  use: No    Alcohol /week: 0.0 standard drinks of alcohol    Drug use: No   Sexual activity: Not Currently    Birth control/protection: Surgical    Comment: hyst  Other Topics Concern   Not on file  Social History Narrative   Lives at home with home with her son (has five children).   Right-handed.   1 cup caffeine per day.   Social Drivers of Corporate investment banker Strain: Low Risk  (01/15/2024)   Overall Financial Resource Strain (CARDIA)    Difficulty of Paying Living Expenses: Not hard at all  Recent Concern: Financial Resource Strain - High Risk (12/26/2023)   Overall Financial Resource Strain (CARDIA)    Difficulty of Paying Living Expenses: Hard  Food Insecurity: No Food Insecurity (01/15/2024)   Hunger Vital Sign    Worried About Running Out of Food in the Last Year: Never true    Ran Out of Food in the Last Year: Never true  Recent Concern: Food Insecurity - Food Insecurity Present (12/26/2023)   Hunger Vital Sign    Worried About Running Out of Food in the Last Year: Often true    Ran Out of Food in the Last Year: Sometimes true   Transportation Needs: Unmet Transportation Needs (01/15/2024)  PRAPARE - Administrator, Civil Service (Medical): Yes    Lack of Transportation (Non-Medical): No  Physical Activity: Sufficiently Active (12/26/2023)   Exercise Vital Sign    Days of Exercise per Week: 7 days    Minutes of Exercise per Session: 30 min  Stress: Stress Concern Present (12/26/2023)   [employer] of Occupational Health - Occupational Stress Questionnaire    Feeling of Stress: Rather much  Social Connections: Moderately Isolated (01/09/2024)   Social Connection and Isolation Panel    Frequency of Communication with Friends and Family: Three times a week    Frequency of Social Gatherings with Friends and Family: Twice a week    Attends Religious Services: More than 4 times per year    Active Member of [employer] or Organizations: No    Attends Engineer, structural: Never    Marital Status: Divorced    Review of systems General: negative for malaise, night sweats, fever, chills, weight loss Neck: Negative for lumps, goiter, pain and significant neck swelling Resp: Negative for cough, wheezing, dyspnea at rest CV: Negative for chest pain, leg swelling, palpitations, orthopnea GI: denies melena, hematochezia, nausea, vomiting, constipation, dysphagia, odyonophagia, early satiety or unintentional weight loss. +diarrhea +abdominal pain +fecal incontinence  MSK: Negative for joint pain or swelling, back pain, and muscle pain. Derm: Negative for itching or rash Psych: Denies depression, anxiety, memory loss, confusion. No homicidal or suicidal ideation.  Heme: Negative for prolonged bleeding, bruising easily, and swollen nodes. Endocrine: Negative for cold or heat intolerance, polyuria, polydipsia and goiter. Neuro: negative for tremor, gait imbalance, syncope and seizures. The remainder of the review of systems is noncontributory.  Physical Exam: BP (!) 93/58   Pulse (!) 102   Temp (!) 97.1  F (36.2 C)   Ht 5' 9 (1.753 m)   Wt 166 lb 12.8 oz (75.7 kg)   BMI 24.63 kg/m  General:   Alert and oriented. No distress noted. Pleasant and cooperative.  Head:  Normocephalic and atraumatic. Eyes:  Conjuctiva clear without scleral icterus. Mouth:  Oral mucosa pink and moist. Good dentition. No lesions. Heart: Normal rate and rhythm, s1 and s2 heart sounds present.  Lungs: Clear lung sounds in all lobes. Respirations equal and unlabored. Abdomen:  +BS, soft, non-tender and non-distended. No rebound or guarding. No HSM or masses noted. Derm: No palmar erythema or jaundice Msk:  Symmetrical without gross deformities. Normal posture. Extremities:  Without edema. Neurologic:  Alert and  oriented x4 Psych:  Alert and cooperative. Normal mood and affect.  Invalid input(s): 6 MONTHS   ASSESSMENT: Cynthia Deleon is a 68 y.o. female presenting today for follow up of IBS, fecal incontinence and GERD  IBS: did not have good results with bentyl . Levsin  listed on her med list though she states she is not sure if she is taking this or not. Reports multi year history of intermittent abdominal spasms and diarrhea. Takes imodium  as needed. Of note she also reports many years of fecal incontinence since having her children. We discussed use of metamucil to help bulk the stools as well as anorectal manometry to further determine if this is try dyssynergia, for which we could send her to pelvic floor physical therapy. She will think about anorectal manometry and let me know if she wishes to pursue this. I will send a refill of levsin  for her to take as well.  GERD: she has prilosec and prevacid  listed on med list and does not seem  entirely sure which she is taking but states she thinks she is taking omeprazole  which keeps her symptoms well controlled. We discussed both of these are PPIs and she should be on one or the other, but not both. Recommend continuing omeprazole  40mg  daily for now.    PLAN:   -start metamucil BID -continue omeprazole  40 mg daily/do not take prevacid  with this -will send levsin  to take PRN for abdominal pain -consider anorectal manometry, pt to make me aware if she wishes to pursue this  All questions were answered, patient verbalized understanding and is in agreement with plan as outlined above.    Follow Up: 1 year   Canaan Holzer L. Mariette, MSN, APRN, AGNP-C Adult-Gerontology Nurse Practitioner [facility]   I have reviewed the note and agree with the APP's assessment as described in this progress note  Toribio Fortune, MD Gastroenterology and Hepatology [facility]

## 2024-03-16 ENCOUNTER — Ambulatory Visit (INDEPENDENT_AMBULATORY_CARE_PROVIDER_SITE_OTHER): Admitting: Audiology

## 2024-03-16 ENCOUNTER — Ambulatory Visit: Payer: Self-pay

## 2024-03-16 ENCOUNTER — Institutional Professional Consult (permissible substitution) (INDEPENDENT_AMBULATORY_CARE_PROVIDER_SITE_OTHER): Admitting: Otolaryngology

## 2024-03-16 NOTE — Telephone Encounter (Signed)
 FYI Only or Action Required?: FYI only for provider.  Patient was last seen in primary care on 03/03/2024 by Cynthia Rock HERO, FNP.  Called Nurse Triage reporting Urinary Frequency.  Symptoms began about a month ago.  Interventions attempted: Prescription medications: keflex .  Symptoms are: unchanged.  Triage Disposition: See HCP Within 4 Hours (Or PCP Triage)  Patient/caregiver understands and will follow disposition?: No, refuses disposition  Copied from CRM [id number]. Topic: Clinical - Red Word Triage >> Mar 16, 2024  4:49 PM Turkey B wrote: [facility] that prompted transfer to Nurse Triage: patient still has severe pain in back and when urinating, stomach pain and frequent urination Reason for Disposition  Side (flank) or lower back pain present  Answer Assessment - Initial Assessment Questions Pt just finished a course of abx, keflex . States she's still having severe back pain and burning when urinating as well as frequent urination and abd pain.  Denies other sxs. RN recommended UC/ED based on triage sxs, pt refused and stated she would not go. RN educated on importance of prompt evaluation and treatment, pt continued to refuse. RN scheduled pt appointment for tomorrow morning at pt request so she can be evaluated.   1. SEVERITY: How bad is the pain?  (e.g., Scale 1-10; mild, moderate, or severe)     5/10 2. FREQUENCY: How many times have you had painful urination today?      About 10 times 3. PATTERN: Is pain present every time you urinate or just sometimes?      Every time 4. ONSET: When did the painful urination start?      A month ago, took abx, has not improved  5. FEVER: Do you have a fever? If Yes, ask: What is your temperature, how was it measured, and when did it start?     denies 6. PAST UTI: Have you had a urine infection before? If Yes, ask: When was the last time? and What happened that time?      Yes, abx. Resolved  7. CAUSE: What do you think  is causing the painful urination?  (e.g., UTI, scratch, Herpes sore)     UTI 8. OTHER SYMPTOMS: Do you have any other symptoms? (e.g., blood in urine, flank pain, genital sores, urgency, vaginal discharge)     denies  Protocols used: Urination Pain - Female-A-AH

## 2024-03-17 ENCOUNTER — Encounter: Payer: Self-pay | Admitting: Nurse Practitioner

## 2024-03-17 ENCOUNTER — Ambulatory Visit (INDEPENDENT_AMBULATORY_CARE_PROVIDER_SITE_OTHER): Admitting: Nurse Practitioner

## 2024-03-17 VITALS — BP 105/67 | HR 94 | Temp 97.4°F | Ht 69.0 in | Wt 162.2 lb

## 2024-03-17 DIAGNOSIS — R300 Dysuria: Secondary | ICD-10-CM | POA: Insufficient documentation

## 2024-03-17 DIAGNOSIS — N390 Urinary tract infection, site not specified: Secondary | ICD-10-CM | POA: Diagnosis not present

## 2024-03-17 LAB — MICROSCOPIC EXAMINATION
RBC, Urine: NONE SEEN /HPF (ref 0–2)
Renal Epithel, UA: NONE SEEN /HPF
WBC, UA: >30 /HPF — AB (ref 0–5)
Yeast, UA: NONE SEEN

## 2024-03-17 LAB — URINALYSIS, COMPLETE
Bilirubin, UA: NEGATIVE
Glucose, UA: NEGATIVE
Ketones, UA: NEGATIVE
Nitrite, UA: POSITIVE — AB
RBC, UA: NEGATIVE
Specific Gravity, UA: 1.025 (ref 1.005–1.030)
Urobilinogen, Ur: 0.2 mg/dL (ref 0.2–1.0)
pH, UA: 6.5 (ref 5.0–7.5)

## 2024-03-17 MED ORDER — FLUCONAZOLE 150 MG PO TABS: 150.0000 mg | ORAL_TABLET | Freq: Once | ORAL | 0 refills | Status: AC

## 2024-03-17 MED ORDER — FOSFOMYCIN TROMETHAMINE 3 G PO PACK: 3.0000 g | PACK | Freq: Once | ORAL | Status: DC

## 2024-03-17 NOTE — Progress Notes (Signed)
 Subjective:  [name], female    DOB: 02/15/1956, 68 y.o.   MRN: [id number]  Patient Care Team: Severa Rock HERO, FNP as PCP - General (Family Medicine) Tasia Lung, MD as Consulting Physician (Psychiatry) Milton Lade, MD as Consulting Physician (Neurology) Billee Mliss BIRCH, [facility] (Pharmacist) Darroll Anes, DO (Optometry) Hope Almarie ORN, NP as Nurse Practitioner (Pulmonary Disease) Porter Andrez SAUNDERS, PA-C (Inactive) (Dermatology) Bonner Ade, MD as Consulting Physician (Physical Medicine and Rehabilitation) O'Neal, Darryle Ned, MD as Consulting Physician (Cardiology) Signa Delon LABOR, NP as Nurse Practitioner (Obstetrics and Gynecology) Bertrum Rosina HERO, RN as [facility] Care Management   Chief Complaint:  No chief complaint on file.   HPI: Cynthia Deleon is a 68 y.o. female presenting on 03/17/2024 for No chief complaint on file.   Discussed the use of AI scribe software for clinical note transcription with the patient, who gave verbal consent to proceed.  History of Present Illness Cynthia Deleon is a 68 year old female who presents with recurrent urinary tract infection symptoms after completing a course of antibiotics.  She has been experiencing symptoms of a urinary tract infection (UTI) which initially began in September. She was treated with a course of antibiotics, specifically keflex , and was scheduled to return for a repeat urine culture two weeks after completing the medication.  She completed the antibiotic course last week, but her symptoms have recurred. She experiences burning pain upon urination, particularly when she stops urinating. She was scheduled to return for a follow-up urine culture on October 15, but due to the recurrence of symptoms, she contacted the clinic and was seen today.      Relevant past medical, surgical, family, and social history reviewed and updated as indicated.  Allergies and medications reviewed  and updated. Data reviewed: Chart in Epic.   Past Medical History:  Diagnosis Date   Aortic atherosclerosis 10/31/2021   Arthritis    hands and knees   Bipolar 1 disorder (HCC)    Borderline diabetic    Chronic back pain    Chronic neck pain    Complication of anesthesia    high anxiety-does not want to be alone   COPD (chronic obstructive pulmonary disease) (HCC)    Current use of estrogen therapy 01/12/2014   Depression    Diabetes mellitus without complication (HCC)    Diplopia    Family history of adverse reaction to anesthesia    sister gas in lungs   GERD (gastroesophageal reflux disease)    Headache    Hepatic steatosis 10/31/2021   Hot flashes 12/15/2013   Hypertension    IBS (irritable bowel syndrome)    Incontinence    Kidney stone    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HCC)    Osteopenia 02/06/2022   Panic attacks    Peptic ulcer    Peripheral neuropathy    Rosacea    Shingles    Sleep apnea    uses a cpap-with oxygen     Past Surgical History:  Procedure Laterality Date   BIOPSY  04/27/2020   Procedure: BIOPSY;  Surgeon: Donnald Charleston, MD;  Location: [facility];  Service: Endoscopy;;   BUNIONECTOMY WITH HAMMERTOE RECONSTRUCTION Right 12/10/2012   Procedure: RIGHT FIRST METATARSAL CHEVRON BUNION CORRECTION,  2 AND 3 HAMMERTOE CORRECTION , RIGHT 3 AND 4 TOE NAIL EXCISION ;  Surgeon: Norleen Armor, MD;  Location: [facility];  Service: Orthopedics;  Laterality: Right;   COLONOSCOPY WITH PROPOFOL  N/A 04/27/2020  Procedure: COLONOSCOPY WITH PROPOFOL ;  Surgeon: Donnald Charleston, MD;  Location: [facility];  Service: Endoscopy;  Laterality: N/A;   COLONOSCOPY WITH PROPOFOL  N/A 06/17/2023   Procedure: COLONOSCOPY WITH PROPOFOL ;  Surgeon: Eartha Angelia Sieving, MD;  Location: [facility];  Service: Gastroenterology;  Laterality: N/A;  2:30PM;ASA 3   FOOT ARTHRODESIS  2000   both feet   LIPOSUCTION  03/2018   POLYPECTOMY  06/17/2023   Procedure:  POLYPECTOMY;  Surgeon: Eartha Angelia Sieving, MD;  Location: [facility];  Service: Gastroenterology;;   RECTAL SURGERY     TONSILLECTOMY     TOTAL ABDOMINAL HYSTERECTOMY      Social History   Socioeconomic History   Marital status: Married    Spouse name: Not on file   Number of children: 5   Years of education: 9th   Highest education level: Not on file  Occupational History   Occupation: Disabled  Tobacco Use   Smoking status: Former    Current packs/day: 0.25    Average packs/day: 1 pack/day for 43.8 years (42.4 ttl pk-yrs)    Types: Cigarettes    Start date: 06/11/1975    Quit date: 06/10/2017   Smokeless tobacco: Never  Vaping Use   Vaping status: Never Used  Substance and Sexual Activity   Alcohol  use: No    Alcohol /week: 0.0 standard drinks of alcohol    Drug use: No   Sexual activity: Not Currently    Birth control/protection: Surgical    Comment: hyst  Other Topics Concern   Not on file  Social History Narrative   Lives at home with home with her son (has five children).   Right-handed.   1 cup caffeine per day.   Social Drivers of Corporate investment banker Strain: Low Risk  (01/15/2024)   Overall Financial Resource Strain (CARDIA)    Difficulty of Paying Living Expenses: Not hard at all  Recent Concern: Financial Resource Strain - High Risk (12/26/2023)   Overall Financial Resource Strain (CARDIA)    Difficulty of Paying Living Expenses: Hard  Food Insecurity: No Food Insecurity (01/15/2024)   Hunger Vital Sign    Worried About Running Out of Food in the Last Year: Never true    Ran Out of Food in the Last Year: Never true  Recent Concern: Food Insecurity - Food Insecurity Present (12/26/2023)   Hunger Vital Sign    Worried About Running Out of Food in the Last Year: Often true    Ran Out of Food in the Last Year: Sometimes true  Transportation Needs: Unmet Transportation Needs (01/15/2024)   PRAPARE - Administrator, Civil Service (Medical):  Yes    Lack of Transportation (Non-Medical): No  Physical Activity: Sufficiently Active (12/26/2023)   Exercise Vital Sign    Days of Exercise per Week: 7 days    Minutes of Exercise per Session: 30 min  Stress: Stress Concern Present (12/26/2023)   [employer] of Occupational Health - Occupational Stress Questionnaire    Feeling of Stress: Rather much  Social Connections: Moderately Isolated (01/09/2024)   Social Connection and Isolation Panel    Frequency of Communication with Friends and Family: Three times a week    Frequency of Social Gatherings with Friends and Family: Twice a week    Attends Religious Services: More than 4 times per year    Active Member of [employer] or Organizations: No    Attends Banker Meetings: Never    Marital Status: Divorced  Intimate Partner Violence: Not At Risk (01/15/2024)   Humiliation, Afraid, Rape, and Kick questionnaire    Fear of Current or Ex-Partner: No    Emotionally Abused: No    Physically Abused: No    Sexually Abused: No    Outpatient Encounter Medications as of 03/17/2024  Medication Sig   albuterol  (VENTOLIN  HFA) 108 (90 Base) MCG/ACT inhaler Inhale 1-2 puffs into the lungs every 6 (six) hours as needed for wheezing or shortness of breath.   ARIPiprazole  (ABILIFY ) 10 MG tablet Take 10 mg by mouth every morning.   Artificial Saliva (BIOTENE DRY MOUTH MOISTURIZING) SOLN Use as directed 5 mLs in the mouth or throat in the morning and at bedtime.   aspirin EC 81 MG tablet Take 81 mg by mouth daily. Swallow whole.   atorvastatin  (LIPITOR) 80 MG tablet Take 1 tablet (80 mg total) by mouth daily.   cetirizine  (ZYRTEC ) 10 MG tablet Take 1 tablet (10 mg total) by mouth daily.   cholecalciferol  (VITAMIN D ) 25 MCG tablet Take 1 tablet (1,000 Units total) by mouth daily.   Continuous Glucose Receiver (FREESTYLE LIBRE 3 READER) DEVI with compatible Freestyle Libre 3 sensor to monitor glucose continuously. DX: E11.65   Continuous Glucose  Sensor (FREESTYLE LIBRE 3 PLUS SENSOR) MISC Change sensor every 15 days. DX: E11.65   diazepam  (VALIUM ) 5 MG tablet Take 5 mg by mouth 2 (two) times daily.   DULoxetine  (CYMBALTA ) 60 MG capsule TAKE (1) CAPSULE BY MOUTH EVERY DAY.   estrogens , conjugated, (PREMARIN ) 0.3 MG tablet TAKE (1) TABLET BY MOUTH ONCE DAILY.   ezetimibe  (ZETIA ) 10 MG tablet TAKE (1) TABLET BY MOUTH ONCE DAILY.   fluconazole  (DIFLUCAN ) 150 MG tablet Take 1 tablet (150 mg total) by mouth once for 1 dose.   fluorometholone (FML) 0.1 % ophthalmic suspension SMARTSIG:In Eye(s)   fluticasone  (FLONASE ) 50 MCG/ACT nasal spray Place 2 sprays into both nostrils daily.   hyoscyamine  (LEVSIN  SL) 0.125 MG SL tablet Place 1 tablet (0.125 mg total) under the tongue every 8 (eight) hours as needed (abdominal pain).   insulin  lispro (HUMALOG  KWIKPEN) 100 UNIT/ML KwikPen Use 5 units 2 times daily with breakfast and dinner when blood sugar is >200 on steroids   Insulin  Pen Needle (PIP PEN NEEDLES 32G X ) 32G X 4 MM MISC Use to inject insulin  2 times daily as needed. DX E11.65   lamoTRIgine  (LAMICTAL ) 200 MG tablet Take 200 mg by mouth at bedtime. For mood disorder.   lansoprazole  (PREVACID ) 30 MG capsule Take 1 capsule (30 mg total) by mouth 2 (two) times daily before a meal.   lisinopril  (ZESTRIL ) 5 MG tablet Take 1 tablet (5 mg total) by mouth daily.   meclizine  (ANTIVERT ) 25 MG tablet Take 2 tablets (50 mg total) by mouth daily as needed for dizziness.   montelukast  (SINGULAIR ) 10 MG tablet TAKE (1) TABLET BY MOUTH AT BEDTIME.   MYRBETRIQ  50 MG TB24 tablet Take 1 tablet (50 mg total) by mouth daily.   omeprazole  (PRILOSEC) 40 MG capsule Take 1 capsule (40 mg total) by mouth daily.   OneTouch Delica Lancets 30G MISC Use to test blood sugar twice daily. DX: E11.9   oxyCODONE  (ROXICODONE ) 15 MG immediate release tablet Take 1 tablet (15 mg total) by mouth every 6 (six) hours as needed for pain.   pregabalin  (LYRICA ) 100 MG capsule Take 2  capsules by mouth 3 (three) times daily.   promethazine  (PHENERGAN ) 12.5 MG tablet Take 1 tablet (12.5 mg total)  by mouth every 8 (eight) hours as needed for nausea or vomiting.   tirzepatide  (MOUNJARO ) 15 MG/0.5ML Pen Inject 15 mg into the skin once a week. DX: E11.9   triamcinolone  cream (KENALOG ) 0.1 % Apply 1 Application topically 2 (two) times daily.   umeclidinium-vilanterol (ANORO ELLIPTA ) 62.5-25 MCG/ACT AEPB Inhale 1 puff into the lungs daily.   valACYclovir  (VALTREX ) 1000 MG tablet Take 1 tablet (1,000 mg total) by mouth daily. Please put in dispill   vitamin B-12 1000 MCG tablet Take 1 tablet (1,000 mcg total) by mouth daily.   VRAYLAR 3 MG capsule Take 3 mg by mouth at bedtime.   Facility-Administered Encounter Medications as of 03/17/2024  Medication   fosfomycin (MONUROL ) packet 3 g    Allergies  Allergen Reactions   Divalproex Sodium Other (See Comments)    Hallucinations   Other reaction(s): Confusion  Hallucinations     Other reaction(s): Confusion    Hallucinations   Other Anaphylaxis, Other (See Comments), Nausea And Vomiting and Nausea Only   Pseudoeph-Hydrocodone -Gg Nausea Only   Sulfa Antibiotics Anaphylaxis, Nausea Only, Other (See Comments) and Swelling    Swelling of tongue.   Valproic Acid Other (See Comments)   Varenicline Other (See Comments)    Altered mental status-Per patient was put on allergy list by Dr Todd Cliche bu patient staes she is not allergic to this and is currently taking it.   Varenicline Tartrate Other (See Comments)    Other reaction(s): Confusion   Codeine Nausea And Vomiting and Other (See Comments)    Other reaction(s): vomiting   Hydrocodone  Nausea And Vomiting and Other (See Comments)   Acetaminophen  Nausea Only   Ambien  [Zolpidem ] Other (See Comments)    Causes sleep walking   Clavulanic Acid Diarrhea   Elemental Sulfur Other (See Comments)    Swelling of tongue   Hydrocod Poli-Chlorphe Poli Er Other (See Comments)     Other reaction(s): Skin itches    Macrobid  [Nitrofurantoin ] Nausea And Vomiting    Dizziness, nausea, vomiting   Paxil [Paroxetine Hcl] Other (See Comments)    Causes ringing in the ears.    Prednisone  Other (See Comments)    Other reaction(s): Unknown    Sulfur Other (See Comments)    sulfur   Amoxicillin  Rash    Has patient had a PCN reaction causing immediate rash, facial/tongue/throat swelling, SOB or lightheadedness with hypotension: No Has patient had a PCN reaction causing severe rash involving mucus membranes or skin necrosis: No Has patient had a PCN reaction that required hospitalization: No Has patient had a PCN reaction occurring within the last 10 years: Yes If all of the above answers are NO, then may proceed with Cephalosporin use.    Farxiga  [Dapagliflozin ]     RECURRENT YEAST/UTI   Metformin  And Related Diarrhea   Tape Rash    Pertinent ROS per HPI, otherwise unremarkable      Objective:  BP 105/67   Pulse 94   Temp (!) 97.4 F (36.3 C)   Ht 5' 9 (1.753 m)   Wt 162 lb 3.2 oz (73.6 kg)   SpO2 97%   BMI 23.95 kg/m    Wt Readings from Last 3 Encounters:  03/17/24 162 lb 3.2 oz (73.6 kg)  03/11/24 166 lb 12.8 oz (75.7 kg)  03/03/24 169 lb 14.4 oz (77.1 kg)    Physical Exam Vitals and nursing note reviewed.  Constitutional:      General: She is not in acute distress. HENT:  Head: Normocephalic and atraumatic.     Nose: Nose normal.     Mouth/Throat:     Mouth: Mucous membranes are moist.  Eyes:     General: No scleral icterus.    Extraocular Movements: Extraocular movements intact.     Conjunctiva/sclera: Conjunctivae normal.     Pupils: Pupils are equal, round, and reactive to light.  Cardiovascular:     Heart sounds: Normal heart sounds.  Pulmonary:     Effort: Pulmonary effort is normal.     Breath sounds: Normal breath sounds.  Abdominal:     General: Bowel sounds are normal.     Palpations: Abdomen is soft. There is no mass.      Tenderness: There is no abdominal tenderness. There is left CVA tenderness.  Musculoskeletal:        General: Normal range of motion.     Right lower leg: No edema.     Left lower leg: No edema.  Skin:    General: Skin is warm and dry.     Findings: No rash.  Neurological:     Mental Status: She is alert and oriented to person, place, and time.  Psychiatric:        Mood and Affect: Mood normal.        Behavior: Behavior normal.        Thought Content: Thought content normal.        Judgment: Judgment normal.    Physical Exam     Urine dipstick shows positive for 1+ protein, positive for nitrates, and positive for leukocytes 2+.   Micro exam: Greater than 30 WBC's per HPF, moderate + bacteria, and epithelial cells 0-10   Results for orders placed or performed in visit on 03/03/24  Urine Culture   Collection Time: 03/03/24 11:31 AM   Specimen: Urine   UR  Result Value Ref Range   Urine Culture, Routine Final report (A)    Organism ID, Bacteria Klebsiella pneumoniae (A)    Antimicrobial Susceptibility Comment   Microscopic Examination   Collection Time: 03/03/24 11:31 AM   Urine  Result Value Ref Range   WBC, UA >30 (A) 0 - 5 /hpf   RBC, Urine None seen 0 - 2 /hpf   Epithelial Cells (non renal) 0-10 0 - 10 /hpf   Renal Epithel, UA None seen None seen /hpf   Bacteria, UA Many (A) None seen/Few   Yeast, UA None seen None seen  Urinalysis, Routine w reflex microscopic   Collection Time: 03/03/24 11:31 AM  Result Value Ref Range   Specific Gravity, UA 1.020 1.005 - 1.030   pH, UA 6.5 5.0 - 7.5   Color, UA Yellow Yellow   Appearance Ur Cloudy (A) Clear   Leukocytes,UA 3+ (A) Negative   Protein,UA Negative Negative/Trace   Glucose, UA Negative Negative   Ketones, UA Negative Negative   RBC, UA Trace (A) Negative   Bilirubin, UA Negative Negative   Urobilinogen, Ur 0.2 0.2 - 1.0 mg/dL   Nitrite, UA Positive (A) Negative   Microscopic Examination See below:         Pertinent labs & imaging results that were available during my care of the patient were reviewed by me and considered in my medical decision making.  Assessment & Plan:  Diagnoses and all orders for this visit:  Dysuria -     Urinalysis, Complete -     Urine Culture -     fosfomycin (MONUROL ) packet 3 g -  fluconazole  (DIFLUCAN ) 150 MG tablet; Take 1 tablet (150 mg total) by mouth once for 1 dose.  Recurrent UTI (urinary tract infection)     Assessment and Plan Cynthia Deleon is a 68 year old Caucasian female seen today for recurrent UTI, no acute distress Assessment & Plan Recurrent dysuria Symptoms returned post-antibiotic treatment. Urine culture pending. - Perform urinalysis and repeat urine culture. Fosfomycin 3 g packet; Diflucan  150 mg to take post UTI if experiencing symptoms of yeast -She understand based on culture result of the antibiotic may be needed -Increase hydration      Continue all other maintenance medications.  Follow up plan: Return if symptoms worsen or fail to improve.   Continue healthy lifestyle choices, including diet (rich in fruits, vegetables, and lean proteins, and low in salt and simple carbohydrates) and exercise (at least 30 minutes of moderate physical activity daily).  Educational handout given for    Clinical References  Dysuria Dysuria is pain or discomfort when you pee. The pain may be felt in your urethra, which is the part of your body that drains pee (urine) from your bladder. The pain may also be felt near your genitals, groin, or in your lower belly or back. You may have to pee often or have the sudden feeling that you need to pee. This condition can affect anyone, but it's more common in females. It can be caused by: A urinary tract infection (UTI). Kidney stones or bladder stones. Some sexually transmitted infections (STIs). Dehydration. This is when there's not enough water in your body. Irritation and swelling in the  vagina. The use of some medicines. The use of some soaps or products with a scent. Follow these instructions at home: Medicines  Take your medicines only as told. Take your antibiotics as told. Do not stop taking them even if you start to feel better. Eating and drinking Drink enough fluid to keep your pee pale yellow. Certain drinks can make the pain worse. Avoid: Drinks with caffeine in them. Tea. Alcohol . In males, alcohol  may irritate the prostate. General instructions Watch your condition for any changes, such as color changes in your pee. Pee often. Do not hold your pee for a long time. If you're female, wipe from front to back after you pee or poop. Use each tissue only once when you wipe. Pee after you have sex. If you've had any tests done, it's up to you to get your test results. Ask your health care provider, or the department doing the test, when your results will be ready. Contact a health care provider if: You have a fever or chills. You have pain in your back or sides. You throw up or feel like you may throw up. You have blood in your pee. You're not peeing as often as normal. You feel very weak. Get help right away if: You have very bad pain that doesn't get better with medicine. You're confused. You have a fast heartbeat while resting. This information is not intended to replace advice given to you by your health care provider. Make sure you discuss any questions you have with your health care provider. Document Revised: 10/01/2022 Document Reviewed: 10/01/2022 Elsevier Patient Education  2024 Elsevier Inc. Urinary Tract Infection, Female A urinary tract infection (UTI) is an infection in your urinary tract. The urinary tract is made up of organs that make, store, and get rid of pee (urine) in your body. These organs include: The kidneys. The ureters. The bladder. The urethra. What are the causes?  Most UTIs are caused by germs called bacteria. They may be in  or near your genitals. These germs grow and cause swelling in your urinary tract. What increases the risk? You're more likely to get a UTI if: You're a female. The urethra is shorter in females than in males. You have a soft tube called a catheter that drains your pee. You can't control when you pee or poop. You have trouble peeing because of: A kidney stone. A urinary blockage. A nerve condition that affects your bladder. Not getting enough to drink. You're sexually active. You use a birth control inside your vagina, like spermicide. You're pregnant. You have low levels of the hormone estrogen in your body. You're an older adult. You're also more likely to get a UTI if you have other health problems. These may include: Diabetes. A weak immune system. Your immune system is your body's defense system. Sickle cell disease. Injury of the spine. What are the signs or symptoms? Symptoms may include: Needing to pee right away. Peeing small amounts often. Pain or burning when you pee. Blood in your pee. Pee that smells bad or odd. Pain in your belly or lower back. You may also: Feel confused. This may be the first symptom in older adults. Vomit. Not feel hungry. Feel tired or easily annoyed. Have a fever or chills. How is this diagnosed? A UTI is diagnosed based on your medical history and an exam. You may also have other tests. These may include: Pee tests. Blood tests. Tests for sexually transmitted infections (STIs). If you've had more than one UTI, you may need to have imaging studies done to find out why you keep getting them. How is this treated? A UTI can be treated by: Taking antibiotics or other medicines. Drinking enough fluid to keep your pee pale yellow. In rare cases, a UTI can cause a very bad condition called sepsis. Sepsis may be treated in the hospital. Follow these instructions at home: Medicines Take your medicines only as told by your health care  provider. If you were given antibiotics, take them as told by your provider. Do not stop taking them even if you start to feel better. General instructions Make sure you: Pee often and fully. Do not hold your pee for a long time. Wipe from front to back after you pee or poop. Use each tissue only once when you wipe. Pee after you have sex. Do not douche or use sprays or powders in your genital area. Contact a health care provider if: Your symptoms don't get better after 1-2 days of taking antibiotics. Your symptoms go away and then come back. You have a fever or chills. You vomit or feel like you may vomit. Get help right away if: You have very bad pain in your back or lower belly. You faint. This information is not intended to replace advice given to you by your health care provider. Make sure you discuss any questions you have with your health care provider. Document Revised: 05/07/2023 Document Reviewed: 08/30/2022 Elsevier Patient Education  [employer].  The above assessment and management plan was discussed with the patient. The patient verbalized understanding of and has agreed to the management plan. Patient is aware to call the clinic if they develop any new symptoms or if symptoms persist or worsen. Patient is aware when to return to the clinic for a follow-up visit. Patient educated on when it is appropriate to go to the emergency department.    Nena  [place] Thompson, DNP [facility] [address] [place], [place] [address] [phone]

## 2024-03-17 NOTE — Telephone Encounter (Signed)
 Appt made.

## 2024-03-18 ENCOUNTER — Ambulatory Visit (HOSPITAL_COMMUNITY)

## 2024-03-18 ENCOUNTER — Ambulatory Visit: Payer: Self-pay | Admitting: Nurse Practitioner

## 2024-03-18 ENCOUNTER — Ambulatory Visit: Payer: Self-pay

## 2024-03-18 DIAGNOSIS — R300 Dysuria: Secondary | ICD-10-CM

## 2024-03-18 DIAGNOSIS — N390 Urinary tract infection, site not specified: Secondary | ICD-10-CM

## 2024-03-18 NOTE — Telephone Encounter (Signed)
 FYI Only or Action Required?: FYI only for provider.  Patient was last seen in primary care on 03/17/2024 by Deitra Morton Sebastian Nena, NP.  Called Nurse Triage reporting Medication Questions .  Symptoms began about a month ago.    Triage Disposition: Call PCP Within 24 Hours  Patient/caregiver understands and will follow disposition?: Yes  *See note below**      Copied from CRM #[id number]. Topic: Clinical - Red Word Triage >> Mar 18, 2024  3:43 PM Rosaria BRAVO wrote: Red Word that prompted transfer to Nurse Triage: Pt is scared, concerned about her UTI symptoms and her diabetes.  She did not understand [address] [place]'s english during their visit yesterday. Reason for Disposition  [1] Follow-up call from patient regarding patient's clinical status AND [2] information NON-URGENT  Answer Assessment - Initial Assessment Questions 1. REASON FOR CALL or QUESTION: What is your reason for calling today? or How can I best  Patient was seen in office yesterday 10/8 for a UTI. She stated she did not understand anything the provider was saying and wants to know what medication needed to be reconstituted, as she has never heard of that before. She is referring to the  fosfomycin (MONUROL ) packet 3 g. Once RN explained that was how you take the medication, she then stated the prescription has not been received by her pharmacy.      [facility] [place], [place] - [id number] [place] [address] Phone: [phone]  Fax: [phone]  Protocols used: PCP Call - No Triage-A-AH

## 2024-03-20 LAB — URINE CULTURE

## 2024-03-22 ENCOUNTER — Telehealth: Admitting: *Deleted

## 2024-03-22 ENCOUNTER — Other Ambulatory Visit

## 2024-03-22 ENCOUNTER — Other Ambulatory Visit: Payer: Self-pay | Admitting: Nurse Practitioner

## 2024-03-22 DIAGNOSIS — R300 Dysuria: Secondary | ICD-10-CM

## 2024-03-22 DIAGNOSIS — N390 Urinary tract infection, site not specified: Secondary | ICD-10-CM

## 2024-03-22 MED ORDER — FOSFOMYCIN TROMETHAMINE 3 G PO PACK: 3.0000 g | PACK | Freq: Once | ORAL | 0 refills | Status: AC

## 2024-03-23 NOTE — Telephone Encounter (Signed)
 02/19/2024 Name: Cynthia Deleon MRN: [id number] DOB: Nov 06, 1955  Chief Complaint  Patient presents with   Medication Management    Diabetes    Cynthia Deleon is a 68 y.o. year old female who presented for a telephone visit.  I connected with  Cynthia Deleon on 02/19/24 by telephone and verified that I am speaking with the correct person using two identifiers. I discussed the limitations of evaluation and management by telemedicine. The patient expressed understanding and agreed to proceed.  Patient was located in her home and PharmD in PCP office during this visit.   They were referred to the pharmacist by their PCP for assistance in managing diabetes.    Subjective:  Patient reports her blood sugars are coming back down to goal FBG<130, PPBG<180.  She has been receiving  steroid injections for her back pain every 3 months.  She has sliding scale humalog  insulin  to use for the week after she gets steroids.  She is tolerating Mounjaro  and her other medications.  She continues on CGM Libre 3 PLUS to monitor blood sugar.   Care Team: Primary Care Provider: Severa Rock HERO, FNP  Medication Access/Adherence  Current Pharmacy:  [facility] [place], [place] - [address] [place] [address] Phone: [phone] Fax: [phone]  [facility] - [place], [place] - [address] [place] [address] Phone: [phone] Fax: [phone]  Patient reports affordability concerns with their medications: No  Patient reports access/transportation concerns to their pharmacy: No  Patient reports adherence concerns with their medications:  Yes  uses pill packaging now   Diabetes:   Current medications: Mounjaro  15mg  weekly Medications tried in the past: januvia , glipizide , metformin , trulicity    Current glucose readings: FBG<130, PPBG<200s Using FSL3+CGM Unable to provide me w/ FSL3+ readings  Patient  denies hypoglycemic s/sx including dizziness, shakiness, sweating. Patient denies hyperglycemic symptoms including polyuria, polydipsia, polyphagia, nocturia, neuropathy, blurred vision.   Current meal patterns:  Discussed meal planning options and Plate method for healthy eating Avoid sugary drinks and desserts Incorporate balanced protein, non starchy veggies, 1 serving of carbohydrate with each meal Increase water intake Increase physical activity as able   Current physical activity: encouraged as able   Current medication access support: medicare/caid  Objective:  Lab Results  Component Value Date   HGBA1C 6.9 (H) 12/18/2023    Lab Results  Component Value Date   CREATININE 0.88 10/19/2023   BUN 17 10/19/2023   NA 135 10/19/2023   K 4.8 10/19/2023   CL 103 10/19/2023   CO2 21 (L) 10/19/2023    Lab Results  Component Value Date   CHOL 180 08/12/2023   HDL 49 08/12/2023   LDLCALC 109 (H) 08/12/2023   TRIG 125 08/12/2023   CHOLHDL 3.7 08/12/2023    Medications Reviewed Today     Reviewed by Billee Mliss BIRCH, [facility] (Pharmacist) on 03/23/24 at 1455  Med List Status: <None>   Medication Order Taking? Sig Documenting Provider Last Dose Status Informant  albuterol  (VENTOLIN  HFA) 108 (90 Base) MCG/ACT inhaler [id number]  Inhale 1-2 puffs into the lungs every 6 (six) hours as needed for wheezing or shortness of breath. Hope Almarie ORN, NP  Active   ARIPiprazole  (ABILIFY ) 10 MG tablet [id number]  Take 10 mg by mouth every morning. [provider]  Active   Artificial Saliva (BIOTENE DRY MOUTH MOISTURIZING) SOLN [id number]  Use as directed 5 mLs in the mouth or  throat in the morning and at bedtime. Severa Rock HERO, FNP  Active   aspirin EC 81 MG tablet [id number]  Take 81 mg by mouth daily. Swallow whole. [provider]  Active   atorvastatin  (LIPITOR) 80 MG tablet [id number]  Take 1 tablet (80 mg total) by mouth daily. Severa Rock HERO, FNP  Active   cetirizine   (ZYRTEC ) 10 MG tablet [id number]  Take 1 tablet (10 mg total) by mouth daily. Severa Rock HERO, FNP  Active   cholecalciferol  (VITAMIN D ) 25 MCG tablet [id number]  Take 1 tablet (1,000 Units total) by mouth daily. Jadine Toribio SQUIBB, MD  Active Self, Pharmacy Records  Continuous Glucose Receiver (FREESTYLE LIBRE 3 READER) [place] [id number]  with compatible Freestyle Libre 3 sensor to monitor glucose continuously. DX: E11.65 Severa Rock HERO, FNP  Active   Continuous Glucose Sensor (FREESTYLE LIBRE 3 PLUS SENSOR) MISC [id number]  Change sensor every 15 days. DX: E11.65 Severa Rock HERO, FNP  Active   diazepam  (VALIUM ) 5 MG tablet [id number]  Take 5 mg by mouth 2 (two) times daily. [provider]  Active   DULoxetine  (CYMBALTA ) 60 MG capsule [id number]  TAKE (1) CAPSULE BY MOUTH EVERY DAY. Onita Duos, MD  Active   estrogens , conjugated, (PREMARIN ) 0.3 MG tablet [id number]  TAKE (1) TABLET BY MOUTH ONCE DAILY. Severa Rock HERO, FNP  Active   ezetimibe  (ZETIA ) 10 MG tablet [id number]  TAKE (1) TABLET BY MOUTH ONCE DAILY. Severa Rock HERO, FNP  Active   fluorometholone (FML) 0.1 % ophthalmic suspension [id number]  SMARTSIG:In Eye(s) [provider]  Active   fluticasone  (FLONASE ) 50 MCG/ACT nasal spray [id number]  Place 2 sprays into both nostrils daily. Joesph Annabella HERO, FNP  Active   hyoscyamine  (LEVSIN  SL) 0.125 MG SL tablet [id number]  Place 1 tablet (0.125 mg total) under the tongue every 8 (eight) hours as needed (abdominal pain). Carlan, Chelsea L, NP  Active   insulin  lispro (HUMALOG  KWIKPEN) 100 UNIT/ML KwikPen [id number]  Use 5 units 2 times daily with breakfast and dinner when blood sugar is >200 on steroids Rakes, Rock HERO, FNP  Active   Insulin  Pen Needle (PIP PEN NEEDLES 32G X ) 32G X 4 MM MISC [id number]  Use to inject insulin  2 times daily as needed. DX E11.65 Severa Rock HERO, FNP  Active   lamoTRIgine  (LAMICTAL ) 200 MG tablet [id number]  Take 200 mg by mouth at bedtime. For mood disorder.  [provider]  Active Self, Pharmacy Records  lansoprazole  (PREVACID ) 30 MG capsule [id number]  Take 1 capsule (30 mg total) by mouth 2 (two) times daily before a meal. Rakes, Rock HERO, FNP  Active   lisinopril  (ZESTRIL ) 5 MG tablet [id number]  Take 1 tablet (5 mg total) by mouth daily. Severa Rock HERO, FNP  Active   meclizine  (ANTIVERT ) 25 MG tablet [id number]  Take 2 tablets (50 mg total) by mouth daily as needed for dizziness. Joesph Annabella HERO, FNP  Active   montelukast  (SINGULAIR ) 10 MG tablet [id number]  TAKE (1) TABLET BY MOUTH AT BEDTIME. Hope Almarie ORN, NP  Active   MYRBETRIQ  50 MG TB24 tablet [id number]  Take 1 tablet (50 mg total) by mouth daily. Severa Rock HERO, FNP  Active   omeprazole  (PRILOSEC) 40 MG capsule [id number]  Take 1 capsule (40 mg total) by mouth daily. Castaneda Mayorga, Toribio, MD  Active   [facility] Lancets 30G [place] [id number]  Use to test blood sugar twice daily. DX: E11.9 Merlynn Niki FALCON, FNP  Active Self, Pharmacy Records  oxyCODONE  (ROXICODONE ) 15 MG immediate release tablet [id number]  Take 1 tablet (15 mg total) by mouth every 6 (six) hours as needed for pain. Maree, Pratik D, DO  Active   pregabalin  (LYRICA ) 100 MG capsule [id number]  Take 2 capsules by mouth 3 (three) times daily. [provider]  Active   promethazine  (PHENERGAN ) 12.5 MG tablet [id number]  Take 1 tablet (12.5 mg total) by mouth every 8 (eight) hours as needed for nausea or vomiting. Severa Rock HERO, FNP  Active   tirzepatide  (MOUNJARO ) 15 MG/0.5ML Pen [id number]  Inject 15 mg into the skin once a week. DX: E11.9 Severa Rock HERO, FNP  Active   triamcinolone  cream (KENALOG ) 0.1 % [id number]  Apply 1 Application topically 2 (two) times daily. Severa Rock HERO, FNP  Active   umeclidinium-vilanterol (ANORO ELLIPTA ) 62.5-25 MCG/ACT AEPB [id number]  Inhale 1 puff into the lungs daily. Hope Almarie ORN, NP  Active   valACYclovir  (VALTREX ) 1000 MG tablet [id number]  Take 1 tablet (1,000 mg total)  by mouth daily. Please put in dispill Rakes, Rock HERO, FNP  Active   vitamin B-12 1000 MCG tablet [id number]  Take 1 tablet (1,000 mcg total) by mouth daily. Jadine Toribio SQUIBB, MD  Active Self, Pharmacy Records           Med Note WORLEY, STEPHANE GORMAN [place] Nov 21, 2021  3:25 PM) Pt has not started yet  VRAYLAR 3 MG capsule [id number]  Take 3 mg by mouth at bedtime. [provider]  Active   Med List Note [place] HERO, [place] 05/22/22 1546): Not Rx'd by PCP: sertraline, valium , lamotrigine , and oxycodone , lyrica - [facility] 12/02/19.            Assessment/Plan:   Diabetes: - Currently uncontrolled--at goal per A1c, but reports PPBG in the 200s when she receives steroid injections for back pain - Reviewed long term cardiovascular and renal outcomes of uncontrolled blood sugar - Reviewed goal A1c, goal fasting, and goal 2 hour post prandial glucose - Recommend to CONTINUE Mounjaro  to 15mg  weekly Denies hypoglycemia Tolerating okay from GI standpoint  - Patient denies personal or family history of multiple endocrine neoplasia type 2, medullary thyroid  cancer; personal history of pancreatitis or gallbladder disease. - Recommend to check glucose using FSL3 plus CGM -uses pill packaging for adherence at [facility]    Follow Up Plan: 1 month  Mliss Tarry Griffin, PharmD, BCACP, CPP Clinical Pharmacist, [facility]

## 2024-03-24 ENCOUNTER — Telehealth: Payer: Self-pay | Admitting: Pharmacist

## 2024-03-24 ENCOUNTER — Ambulatory Visit: Admitting: Family Medicine

## 2024-03-24 ENCOUNTER — Encounter (INDEPENDENT_AMBULATORY_CARE_PROVIDER_SITE_OTHER): Payer: Self-pay | Admitting: Gastroenterology

## 2024-03-24 ENCOUNTER — Encounter: Payer: Self-pay | Admitting: Family Medicine

## 2024-03-24 DIAGNOSIS — E119 Type 2 diabetes mellitus without complications: Secondary | ICD-10-CM | POA: Diagnosis not present

## 2024-03-24 DIAGNOSIS — E782 Mixed hyperlipidemia: Secondary | ICD-10-CM | POA: Diagnosis not present

## 2024-03-24 DIAGNOSIS — R152 Fecal urgency: Secondary | ICD-10-CM

## 2024-03-24 DIAGNOSIS — Z794 Long term (current) use of insulin: Secondary | ICD-10-CM | POA: Diagnosis not present

## 2024-03-24 DIAGNOSIS — E1142 Type 2 diabetes mellitus with diabetic polyneuropathy: Secondary | ICD-10-CM | POA: Diagnosis not present

## 2024-03-24 DIAGNOSIS — K219 Gastro-esophageal reflux disease without esophagitis: Secondary | ICD-10-CM | POA: Diagnosis not present

## 2024-03-24 DIAGNOSIS — E785 Hyperlipidemia, unspecified: Secondary | ICD-10-CM | POA: Diagnosis not present

## 2024-03-24 DIAGNOSIS — E1159 Type 2 diabetes mellitus with other circulatory complications: Secondary | ICD-10-CM

## 2024-03-24 DIAGNOSIS — E1169 Type 2 diabetes mellitus with other specified complication: Secondary | ICD-10-CM

## 2024-03-24 DIAGNOSIS — R159 Full incontinence of feces: Secondary | ICD-10-CM

## 2024-03-24 DIAGNOSIS — E11649 Type 2 diabetes mellitus with hypoglycemia without coma: Secondary | ICD-10-CM

## 2024-03-24 DIAGNOSIS — I152 Hypertension secondary to endocrine disorders: Secondary | ICD-10-CM | POA: Diagnosis not present

## 2024-03-24 DIAGNOSIS — Z7985 Long-term (current) use of injectable non-insulin antidiabetic drugs: Secondary | ICD-10-CM

## 2024-03-24 DIAGNOSIS — N3941 Urge incontinence: Secondary | ICD-10-CM

## 2024-03-24 LAB — LIPID PANEL

## 2024-03-24 LAB — BAYER DCA HB A1C WAIVED: HB A1C (BAYER DCA - WAIVED): 6.4 % — ABNORMAL HIGH (ref 4.8–5.6)

## 2024-03-24 MED ORDER — TIRZEPATIDE 15 MG/0.5ML ~~LOC~~ SOAJ: 15.0000 mg | SUBCUTANEOUS | 1 refills | Status: AC

## 2024-03-24 MED ORDER — EZETIMIBE 10 MG PO TABS: ORAL_TABLET | ORAL | 1 refills | Status: AC

## 2024-03-24 MED ORDER — ATORVASTATIN CALCIUM 80 MG PO TABS: 80.0000 mg | ORAL_TABLET | Freq: Every day | ORAL | 1 refills | Status: DC

## 2024-03-24 MED ORDER — LANSOPRAZOLE 30 MG PO CPDR
30.0000 mg | DELAYED_RELEASE_CAPSULE | Freq: Two times a day (BID) | ORAL | 1 refills | Status: AC
Start: 2024-03-24 — End: ?

## 2024-03-24 NOTE — Progress Notes (Signed)
 Subjective:  [name], female    DOB: January 03, 1956, 68 y.o.   MRN: [id number]  Patient Care Team: Severa Rock HERO, FNP as PCP - General (Family Medicine) Tasia Lung, MD as Consulting Physician (Psychiatry) Milton Lade, MD as Consulting Physician (Neurology) Billee Mliss BIRCH, [facility] (Pharmacist) Darroll Anes, DO (Optometry) Hope Almarie ORN, NP as Nurse Practitioner (Pulmonary Disease) Porter Andrez SAUNDERS, PA-C (Inactive) (Dermatology) Bonner Ade, MD as Consulting Physician (Physical Medicine and Rehabilitation) O'Neal, Darryle Ned, MD as Consulting Physician (Cardiology) Signa Delon LABOR, NP as Nurse Practitioner (Obstetrics and Gynecology) Bertrum Rosina HERO, RN as [facility] Care Management   Chief Complaint:  Medical Management of Chronic Issues (3 month chronic follow up )   HPI: Cynthia Deleon is a 68 y.o. female presenting on 03/24/2024 for Medical Management of Chronic Issues (3 month chronic follow up )   Cynthia Deleon is a 68 year old female with diabetes and urinary incontinence who presents for chronic follow-up and management of her urinary tract infection.  She has ongoing issues with a urinary tract infection. There was a delay in receiving the prescribed antibiotic due to a delay in the medication being sent in to the pharmacy, and she initially received Diflucan  instead. The correct medication was obtained yesterday, five days after it was prescribed.  She has diabetes and experiences occasional hypoglycemia, with the lowest recent reading being 68 mg/dL at night. She manages these episodes by keeping orange juice by her bed. She is currently using Humalog  QuickPen and Mounjaro  15 mg weekly, with no significant side effects reported. She has not seen her diabetes specialist in a while.  She experiences neuropathy, which is managed with Lyrica . She mentions needing diabetic shoes but has had difficulty obtaining them as the previous  supplier no longer provides them. She is awaiting assistance in finding a new supplier.  She has urinary and bowel incontinence, with urine incontinence described as urge incontinence, where she 'dribbles all the way to the bathroom.' She is on medication for this, which helps manage the symptoms. Bowel incontinence occurs during episodes of diarrhea, which are not frequent. States she typically uses one adult pull up daily, sometimes more.   Her blood pressure is managed with lisinopril . No cough, chest pain, or significant leg swelling. She reports occasional mild leg swelling.  She is under psychiatric care, seeing her psychiatrist every three to four months, with recent medication adjustments.   She is also on Prevacid  for reflux, Zetia , and atorvastatin  for cholesterol management, and reports adherence to her medication regimen.  She has completed her hepatitis B vaccinations and received her flu shot at [facility]. She has not had COVID this year.          Relevant past medical, surgical, family, and social history reviewed and updated as indicated.  Allergies and medications reviewed and updated. Data reviewed: Chart in Epic.   Past Medical History:  Diagnosis Date   Aortic atherosclerosis 10/31/2021   Arthritis    hands and knees   Bipolar 1 disorder (HCC)    Borderline diabetic    Chronic back pain    Chronic neck pain    Complication of anesthesia    high anxiety-does not want to be alone   COPD (chronic obstructive pulmonary disease) (HCC)    Current use of estrogen therapy 01/12/2014   Depression    Diabetes mellitus without complication (HCC)    Diplopia    Family history of adverse reaction  to anesthesia    sister gas in lungs   GERD (gastroesophageal reflux disease)    Headache    Hepatic steatosis 10/31/2021   Hot flashes 12/15/2013   Hypertension    IBS (irritable bowel syndrome)    Incontinence    Kidney stone    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HCC)     Osteopenia 02/06/2022   Panic attacks    Peptic ulcer    Peripheral neuropathy    Rosacea    Shingles    Sleep apnea    uses a cpap-with oxygen     Past Surgical History:  Procedure Laterality Date   BIOPSY  04/27/2020   Procedure: BIOPSY;  Surgeon: Donnald Charleston, MD;  Location: [facility];  Service: Endoscopy;;   BUNIONECTOMY WITH HAMMERTOE RECONSTRUCTION Right 12/10/2012   Procedure: RIGHT FIRST METATARSAL CHEVRON BUNION CORRECTION,  2 AND 3 HAMMERTOE CORRECTION , RIGHT 3 AND 4 TOE NAIL EXCISION ;  Surgeon: Norleen Armor, MD;  Location: [facility];  Service: Orthopedics;  Laterality: Right;   COLONOSCOPY WITH PROPOFOL  N/A 04/27/2020   Procedure: COLONOSCOPY WITH PROPOFOL ;  Surgeon: Donnald Charleston, MD;  Location: [facility];  Service: Endoscopy;  Laterality: N/A;   COLONOSCOPY WITH PROPOFOL  N/A 06/17/2023   Procedure: COLONOSCOPY WITH PROPOFOL ;  Surgeon: Eartha Angelia Sieving, MD;  Location: [facility];  Service: Gastroenterology;  Laterality: N/A;  2:30PM;ASA 3   FOOT ARTHRODESIS  2000   both feet   LIPOSUCTION  03/2018   POLYPECTOMY  06/17/2023   Procedure: POLYPECTOMY;  Surgeon: Eartha Angelia Sieving, MD;  Location: [facility];  Service: Gastroenterology;;   RECTAL SURGERY     TONSILLECTOMY     TOTAL ABDOMINAL HYSTERECTOMY      Social History   Socioeconomic History   Marital status: Married    Spouse name: Not on file   Number of children: 5   Years of education: 9th   Highest education level: Not on file  Occupational History   Occupation: Disabled  Tobacco Use   Smoking status: Former    Current packs/day: 0.25    Average packs/day: 1 pack/day for 43.8 years (42.4 ttl pk-yrs)    Types: Cigarettes    Start date: 06/11/1975    Quit date: 06/10/2017   Smokeless tobacco: Never  Vaping Use   Vaping status: Never Used  Substance and Sexual Activity   Alcohol  use: No    Alcohol /week: 0.0 standard drinks of alcohol    Drug use: No   Sexual  activity: Not Currently    Birth control/protection: Surgical    Comment: hyst  Other Topics Concern   Not on file  Social History Narrative   Lives at home with home with her son (has five children).   Right-handed.   1 cup caffeine per day.   Social Drivers of Corporate investment banker Strain: Low Risk  (01/15/2024)   Overall Financial Resource Strain (CARDIA)    Difficulty of Paying Living Expenses: Not hard at all  Recent Concern: Financial Resource Strain - High Risk (12/26/2023)   Overall Financial Resource Strain (CARDIA)    Difficulty of Paying Living Expenses: Hard  Food Insecurity: No Food Insecurity (01/15/2024)   Hunger Vital Sign    Worried About Running Out of Food in the Last Year: Never true    Ran Out of Food in the Last Year: Never true  Recent Concern: Food Insecurity - Food Insecurity Present (12/26/2023)   Hunger Vital Sign    Worried About Running Out  of Food in the Last Year: Often true    Ran Out of Food in the Last Year: Sometimes true  Transportation Needs: Unmet Transportation Needs (01/15/2024)   PRAPARE - Administrator, Civil Service (Medical): Yes    Lack of Transportation (Non-Medical): No  Physical Activity: Sufficiently Active (12/26/2023)   Exercise Vital Sign    Days of Exercise per Week: 7 days    Minutes of Exercise per Session: 30 min  Stress: Stress Concern Present (12/26/2023)   [employer] of Occupational Health - Occupational Stress Questionnaire    Feeling of Stress: Rather much  Social Connections: Moderately Isolated (01/09/2024)   Social Connection and Isolation Panel    Frequency of Communication with Friends and Family: Three times a week    Frequency of Social Gatherings with Friends and Family: Twice a week    Attends Religious Services: More than 4 times per year    Active Member of [employer] or Organizations: No    Attends Banker Meetings: Never    Marital Status: Divorced  Catering manager Violence:  Not At Risk (01/15/2024)   Humiliation, Afraid, Rape, and Kick questionnaire    Fear of Current or Ex-Partner: No    Emotionally Abused: No    Physically Abused: No    Sexually Abused: No    Outpatient Encounter Medications as of 03/24/2024  Medication Sig   albuterol  (VENTOLIN  HFA) 108 (90 Base) MCG/ACT inhaler Inhale 1-2 puffs into the lungs every 6 (six) hours as needed for wheezing or shortness of breath.   ARIPiprazole  (ABILIFY ) 10 MG tablet Take 10 mg by mouth every morning.   Artificial Saliva (BIOTENE DRY MOUTH MOISTURIZING) SOLN Use as directed 5 mLs in the mouth or throat in the morning and at bedtime.   aspirin EC 81 MG tablet Take 81 mg by mouth daily. Swallow whole.   cetirizine  (ZYRTEC ) 10 MG tablet Take 1 tablet (10 mg total) by mouth daily.   cholecalciferol  (VITAMIN D ) 25 MCG tablet Take 1 tablet (1,000 Units total) by mouth daily.   Continuous Glucose Receiver (FREESTYLE LIBRE 3 READER) DEVI with compatible Freestyle Libre 3 sensor to monitor glucose continuously. DX: E11.65   Continuous Glucose Sensor (FREESTYLE LIBRE 3 PLUS SENSOR) MISC Change sensor every 15 days. DX: E11.65   diazepam  (VALIUM ) 5 MG tablet Take 5 mg by mouth 2 (two) times daily.   DULoxetine  (CYMBALTA ) 60 MG capsule TAKE (1) CAPSULE BY MOUTH EVERY DAY.   estrogens , conjugated, (PREMARIN ) 0.3 MG tablet TAKE (1) TABLET BY MOUTH ONCE DAILY.   fluorometholone (FML) 0.1 % ophthalmic suspension SMARTSIG:In Eye(s)   fluticasone  (FLONASE ) 50 MCG/ACT nasal spray Place 2 sprays into both nostrils daily.   hyoscyamine  (LEVSIN  SL) 0.125 MG SL tablet Place 1 tablet (0.125 mg total) under the tongue every 8 (eight) hours as needed (abdominal pain).   insulin  lispro (HUMALOG  KWIKPEN) 100 UNIT/ML KwikPen Use 5 units 2 times daily with breakfast and dinner when blood sugar is >200 on steroids   Insulin  Pen Needle (PIP PEN NEEDLES 32G X ) 32G X 4 MM MISC Use to inject insulin  2 times daily as needed. DX E11.65    lamoTRIgine  (LAMICTAL ) 200 MG tablet Take 200 mg by mouth at bedtime. For mood disorder.   lisinopril  (ZESTRIL ) 5 MG tablet Take 1 tablet (5 mg total) by mouth daily.   meclizine  (ANTIVERT ) 25 MG tablet Take 2 tablets (50 mg total) by mouth daily as needed for dizziness.   montelukast  (  SINGULAIR ) 10 MG tablet TAKE (1) TABLET BY MOUTH AT BEDTIME.   MYRBETRIQ  50 MG TB24 tablet Take 1 tablet (50 mg total) by mouth daily.   omeprazole  (PRILOSEC) 40 MG capsule Take 1 capsule (40 mg total) by mouth daily.   OneTouch Delica Lancets 30G MISC Use to test blood sugar twice daily. DX: E11.9   oxyCODONE  (ROXICODONE ) 15 MG immediate release tablet Take 1 tablet (15 mg total) by mouth every 6 (six) hours as needed for pain.   pregabalin  (LYRICA ) 100 MG capsule Take 2 capsules by mouth 3 (three) times daily.   promethazine  (PHENERGAN ) 12.5 MG tablet Take 1 tablet (12.5 mg total) by mouth every 8 (eight) hours as needed for nausea or vomiting.   triamcinolone  cream (KENALOG ) 0.1 % Apply 1 Application topically 2 (two) times daily.   umeclidinium-vilanterol (ANORO ELLIPTA ) 62.5-25 MCG/ACT AEPB Inhale 1 puff into the lungs daily.   valACYclovir  (VALTREX ) 1000 MG tablet Take 1 tablet (1,000 mg total) by mouth daily. Please put in dispill   vitamin B-12 1000 MCG tablet Take 1 tablet (1,000 mcg total) by mouth daily.   VRAYLAR 3 MG capsule Take 3 mg by mouth at bedtime.   atorvastatin  (LIPITOR) 80 MG tablet Take 1 tablet (80 mg total) by mouth daily.   ezetimibe  (ZETIA ) 10 MG tablet TAKE (1) TABLET BY MOUTH ONCE DAILY.   fosfomycin (MONUROL ) 3 g PACK Take 3 g by mouth once for 1 dose. (Patient not taking: Reported on 03/24/2024)   lansoprazole  (PREVACID ) 30 MG capsule Take 1 capsule (30 mg total) by mouth 2 (two) times daily before a meal.   tirzepatide  (MOUNJARO ) 15 MG/0.5ML Pen Inject 15 mg into the skin once a week. DX: E11.9   [DISCONTINUED] atorvastatin  (LIPITOR) 80 MG tablet Take 1 tablet (80 mg total) by mouth  daily.   [DISCONTINUED] ezetimibe  (ZETIA ) 10 MG tablet TAKE (1) TABLET BY MOUTH ONCE DAILY.   [DISCONTINUED] lansoprazole  (PREVACID ) 30 MG capsule Take 1 capsule (30 mg total) by mouth 2 (two) times daily before a meal.   [DISCONTINUED] tirzepatide  (MOUNJARO ) 15 MG/0.5ML Pen Inject 15 mg into the skin once a week. DX: E11.9   No facility-administered encounter medications on file as of 03/24/2024.    Allergies  Allergen Reactions   Divalproex Sodium Other (See Comments)    Hallucinations   Other reaction(s): Confusion  Hallucinations     Other reaction(s): Confusion    Hallucinations   Other Anaphylaxis, Other (See Comments), Nausea And Vomiting and Nausea Only   Pseudoeph-Hydrocodone -Gg Nausea Only   Sulfa Antibiotics Anaphylaxis, Nausea Only, Other (See Comments) and Swelling    Swelling of tongue.   Valproic Acid Other (See Comments)   Varenicline Other (See Comments)    Altered mental status-Per patient was put on allergy list by Dr Todd Cliche bu patient staes she is not allergic to this and is currently taking it.   Varenicline Tartrate Other (See Comments)    Other reaction(s): Confusion   Codeine Nausea And Vomiting and Other (See Comments)    Other reaction(s): vomiting   Hydrocodone  Nausea And Vomiting and Other (See Comments)   Acetaminophen  Nausea Only   Ambien  [Zolpidem ] Other (See Comments)    Causes sleep walking   Clavulanic Acid Diarrhea   Elemental Sulfur Other (See Comments)    Swelling of tongue   Hydrocod Poli-Chlorphe Poli Er Other (See Comments)    Other reaction(s): Skin itches    Macrobid  [Nitrofurantoin ] Nausea And Vomiting    Dizziness,  nausea, vomiting   Paxil [Paroxetine Hcl] Other (See Comments)    Causes ringing in the ears.    Prednisone  Other (See Comments)    Other reaction(s): Unknown    Sulfur Other (See Comments)    sulfur   Amoxicillin  Rash    Has patient had a PCN reaction causing immediate rash, facial/tongue/throat swelling,  SOB or lightheadedness with hypotension: No Has patient had a PCN reaction causing severe rash involving mucus membranes or skin necrosis: No Has patient had a PCN reaction that required hospitalization: No Has patient had a PCN reaction occurring within the last 10 years: Yes If all of the above answers are NO, then may proceed with Cephalosporin use.    Farxiga  [Dapagliflozin ]     RECURRENT YEAST/UTI   Metformin  And Related Diarrhea   Tape Rash    Pertinent ROS per HPI, otherwise unremarkable      Objective:  BP 115/74   Pulse 98   Temp 97.8 F (36.6 C)   Ht 5' 9 (1.753 m)   Wt 166 lb 6.4 oz (75.5 kg)   SpO2 96%   BMI 24.57 kg/m    Wt Readings from Last 3 Encounters:  03/24/24 166 lb 6.4 oz (75.5 kg)  03/17/24 162 lb 3.2 oz (73.6 kg)  03/11/24 166 lb 12.8 oz (75.7 kg)    Physical Exam Vitals and nursing note reviewed.  Constitutional:      General: She is not in acute distress.    Appearance: Normal appearance. She is normal weight. She is not ill-appearing, toxic-appearing or diaphoretic.  HENT:     Head: Normocephalic and atraumatic.     Nose: Nose normal.     Mouth/Throat:     Mouth: Mucous membranes are moist.  Eyes:     Pupils: Pupils are equal, round, and reactive to light.  Cardiovascular:     Rate and Rhythm: Normal rate and regular rhythm.     Heart sounds: Normal heart sounds.  Pulmonary:     Effort: Pulmonary effort is normal.     Breath sounds: Normal breath sounds.  Abdominal:     Tenderness: There is no right CVA tenderness or left CVA tenderness.  Musculoskeletal:     Cervical back: Neck supple.     Right lower leg: No edema.     Left lower leg: No edema.  Skin:    General: Skin is warm and dry.     Capillary Refill: Capillary refill takes less than 2 seconds.  Neurological:     General: No focal deficit present.     Mental Status: She is alert and oriented to person, place, and time.  Psychiatric:        Mood and Affect: Mood  normal.        Behavior: Behavior normal.        Thought Content: Thought content normal.        Judgment: Judgment normal.     Results for orders placed or performed in visit on 03/17/24  Microscopic Examination   Collection Time: 03/17/24 11:17 AM   Urine  Result Value Ref Range   WBC, UA >30 (A) 0 - 5 /hpf   RBC, Urine None seen 0 - 2 /hpf   Epithelial Cells (non renal) 0-10 0 - 10 /hpf   Renal Epithel, UA None seen None seen /hpf   Bacteria, UA Moderate (A) None seen/Few   Yeast, UA None seen None seen  Urinalysis, Complete   Collection Time: 03/17/24 11:17 AM  Result Value Ref Range   Specific Gravity, UA 1.025 1.005 - 1.030   pH, UA 6.5 5.0 - 7.5   Color, UA Yellow Yellow   Appearance Ur Cloudy (A) Clear   Leukocytes,UA 2+ (A) Negative   Protein,UA 1+ (A) Negative/Trace   Glucose, UA Negative Negative   Ketones, UA Negative Negative   RBC, UA Negative Negative   Bilirubin, UA Negative Negative   Urobilinogen, Ur 0.2 0.2 - 1.0 mg/dL   Nitrite, UA Positive (A) Negative   Microscopic Examination See below:   Urine Culture   Collection Time: 03/17/24 11:20 AM   Specimen: Urine   UR  Result Value Ref Range   Urine Culture, Routine Final report (A)    Organism ID, Bacteria Klebsiella pneumoniae (A)    Antimicrobial Susceptibility Comment        Pertinent labs & imaging results that were available during my care of the patient were reviewed by me and considered in my medical decision making.  Assessment & Plan:  Cynthia Deleon was seen today for medical management of chronic issues.  Diagnoses and all orders for this visit:  Diabetes mellitus treated with injections of non-insulin  medication (HCC) -     Bayer DCA Hb A1c Waived -     CBC with Differential/Platelet -     CMP14+EGFR -     Lipid panel -     TSH -     T4, free -     tirzepatide  (MOUNJARO ) 15 MG/0.5ML Pen; Inject 15 mg into the skin once a week. DX: E11.9 -     Microalbumin / creatinine urine ratio -      For home use only DME Other see comment -     For home use only DME Other see comment  Gastroesophageal reflux disease without esophagitis -     CBC with Differential/Platelet -     lansoprazole  (PREVACID ) 30 MG capsule; Take 1 capsule (30 mg total) by mouth 2 (two) times daily before a meal.  Type 2 diabetes mellitus with other specified complication, with long-term current use of insulin  (HCC) -     Bayer DCA Hb A1c Waived -     CBC with Differential/Platelet -     CMP14+EGFR -     Lipid panel -     TSH -     T4, free -     tirzepatide  (MOUNJARO ) 15 MG/0.5ML Pen; Inject 15 mg into the skin once a week. DX: E11.9 -     ezetimibe  (ZETIA ) 10 MG tablet; TAKE (1) TABLET BY MOUTH ONCE DAILY. -     atorvastatin  (LIPITOR) 80 MG tablet; Take 1 tablet (80 mg total) by mouth daily. -     Microalbumin / creatinine urine ratio -     For home use only DME Other see comment  Hypertension associated with diabetes (HCC) -     Bayer DCA Hb A1c Waived -     CBC with Differential/Platelet -     CMP14+EGFR -     Lipid panel -     TSH -     T4, free -     Microalbumin / creatinine urine ratio  Polyneuropathy due to type 2 diabetes mellitus (HCC) -     Bayer DCA Hb A1c Waived -     CBC with Differential/Platelet -     CMP14+EGFR -     Lipid panel -     TSH -     T4, free -  tirzepatide  (MOUNJARO ) 15 MG/0.5ML Pen; Inject 15 mg into the skin once a week. DX: E11.9 -     For home use only DME Other see comment  Hyperlipidemia associated with type 2 diabetes mellitus (HCC) -     Bayer DCA Hb A1c Waived -     CBC with Differential/Platelet -     CMP14+EGFR -     Lipid panel -     TSH -     T4, free -     ezetimibe  (ZETIA ) 10 MG tablet; TAKE (1) TABLET BY MOUTH ONCE DAILY.  Urge incontinence of urine -     Urine Culture -     For home use only DME Other see comment -     For home use only DME Other see comment  Incontinence of feces with fecal urgency -     For home use only DME Other  see comment -     For home use only DME Other see comment        Urinary tract infection Recent urinary tract infection treated with an antibiotic, with a delay in obtaining the medication. A urine culture will be obtained to assess for persistent infection. - Obtain urine culture to assess for persistent infection - Advise to take the prescribed antibiotic once home - Review culture results to determine if additional treatment is needed  Type 2 diabetes mellitus with hypoglycemia and diabetic neuropathy Type 2 diabetes is generally well-controlled with occasional hypoglycemia, managed with orange juice. Neuropathy is managed with Lyrica . Diabetic shoes are needed but currently unavailable from the previous supplier. - Research and identify a supplier for diabetic shoes - Provide DME form for diabetic shoes - Continue current diabetes management with Humalog  QuickPen and Mounjaro   Urinary and fecal incontinence Urinary incontinence characterized by urge incontinence, managed with medication. Fecal incontinence occurs with episodes of diarrhea, not frequent. - Order incontinence supplies through [place] - Continue current medication for urinary incontinence  Gastroesophageal reflux disease (GERD) GERD is well-controlled with Prevacid .  Mixed hyperlipidemia Mixed hyperlipidemia is managed with Zetia  and atorvastatin , with adherence to medication regimen.  General Health Maintenance Completed hepatitis vaccinations and received flu shot. Advised to get the new COVID booster due to comorbid conditions. - Verify flu shot record in [facility] - Advise to get COVID booster at the pharmacy  Follow-up Plans include obtaining a urine sample today and checking kidney function. Lung cancer screening is scheduled for next week. - Obtain urine sample today - Check renal function and protein in urine - Confirm lung cancer screening appointment for next week          Continue all other  maintenance medications.  Follow up plan: Return in about 3 months (around 06/24/2024) for DM, HTN.   Continue healthy lifestyle choices, including diet (rich in fruits, vegetables, and lean proteins, and low in salt and simple carbohydrates) and exercise (at least 30 minutes of moderate physical activity daily).  Educational handout given for DM  The above assessment and management plan was discussed with the patient. The patient verbalized understanding of and has agreed to the management plan. Patient is aware to call the clinic if they develop any new symptoms or if symptoms persist or worsen. Patient is aware when to return to the clinic for a follow-up visit. Patient educated on when it is appropriate to go to the emergency department.   Cynthia Bruns, FNP-C [facility] [phone]

## 2024-03-24 NOTE — Patient Instructions (Addendum)

## 2024-03-25 ENCOUNTER — Telehealth: Payer: Self-pay | Admitting: *Deleted

## 2024-03-25 ENCOUNTER — Encounter: Payer: Self-pay | Admitting: *Deleted

## 2024-03-25 ENCOUNTER — Ambulatory Visit: Payer: Self-pay | Admitting: Family Medicine

## 2024-03-25 LAB — T4, FREE: Free T4: 1.19 ng/dL (ref 0.82–1.77)

## 2024-03-25 LAB — CBC WITH DIFFERENTIAL/PLATELET
Basophils Absolute: 0.1 x10E3/uL (ref 0.0–0.2)
Basos: 1 %
EOS (ABSOLUTE): 0.1 x10E3/uL (ref 0.0–0.4)
Eos: 1 %
Hematocrit: 49.7 % — ABNORMAL HIGH (ref 34.0–46.6)
Hemoglobin: 17 g/dL — ABNORMAL HIGH (ref 11.1–15.9)
Immature Grans (Abs): 0 x10E3/uL (ref 0.0–0.1)
Immature Granulocytes: 0 %
Lymphocytes Absolute: 2.2 x10E3/uL (ref 0.7–3.1)
Lymphs: 22 %
MCH: 33.3 pg — ABNORMAL HIGH (ref 26.6–33.0)
MCHC: 34.2 g/dL (ref 31.5–35.7)
MCV: 98 fL — ABNORMAL HIGH (ref 79–97)
Monocytes Absolute: 0.6 x10E3/uL (ref 0.1–0.9)
Monocytes: 6 %
Neutrophils Absolute: 6.8 x10E3/uL (ref 1.4–7.0)
Neutrophils: 70 %
Platelets: 226 x10E3/uL (ref 150–450)
RBC: 5.1 x10E6/uL (ref 3.77–5.28)
RDW: 12.5 % (ref 11.7–15.4)
WBC: 9.9 x10E3/uL (ref 3.4–10.8)

## 2024-03-25 LAB — MICROALBUMIN / CREATININE URINE RATIO
Creatinine, Urine: 61.8 mg/dL
Microalb/Creat Ratio: 6 mg/g{creat} (ref 0–29)
Microalbumin, Urine: 3.8 ug/mL

## 2024-03-25 LAB — CMP14+EGFR
ALT: 8 IU/L (ref 0–32)
AST: 12 IU/L (ref 0–40)
Albumin: 4.3 g/dL (ref 3.9–4.9)
Alkaline Phosphatase: 83 IU/L (ref 49–135)
BUN/Creatinine Ratio: 17 (ref 12–28)
BUN: 14 mg/dL (ref 8–27)
Bilirubin Total: 0.6 mg/dL (ref 0.0–1.2)
CO2: 25 mmol/L (ref 20–29)
Calcium: 10.1 mg/dL (ref 8.7–10.3)
Chloride: 101 mmol/L (ref 96–106)
Creatinine, Ser: 0.84 mg/dL (ref 0.57–1.00)
Globulin, Total: 1.9 g/dL (ref 1.5–4.5)
Glucose: 143 mg/dL — AB (ref 70–99)
Potassium: 5 mmol/L (ref 3.5–5.2)
Sodium: 143 mmol/L (ref 134–144)
Total Protein: 6.2 g/dL (ref 6.0–8.5)
eGFR: 76 mL/min/1.73 (ref >59)

## 2024-03-25 LAB — LIPID PANEL
Cholesterol, Total: 131 mg/dL (ref 100–199)
HDL: 39 mg/dL — AB (ref >39)
LDL CALC COMMENT:: 3.4 ratio (ref 0.0–4.4)
LDL Chol Calc (NIH): 63 mg/dL (ref 0–99)
Triglycerides: 169 mg/dL — AB (ref 0–149)
VLDL Cholesterol Cal: 29 mg/dL (ref 5–40)

## 2024-03-25 LAB — TSH: TSH: 0.494 u[IU]/mL (ref 0.450–4.500)

## 2024-03-25 NOTE — Addendum Note (Signed)
 Addended by: Rodrickus Min D on: 03/25/2024 02:18 PM   Modules accepted: Orders

## 2024-03-25 NOTE — Patient Instructions (Signed)
 Cynthia Deleon - I am sorry I was unable to reach you today for our scheduled appointment. I work with Severa Rock HERO, FNP and am calling to support your healthcare needs. Please contact me at [phone] at your earliest convenience. I look forward to speaking with you soon.   Thank you,  Rosina Forte, BSN RN [facility], [facility] RN Care Manager Direct Dial : [phone]  Fax: [phone]

## 2024-03-26 DIAGNOSIS — R269 Unspecified abnormalities of gait and mobility: Secondary | ICD-10-CM | POA: Diagnosis not present

## 2024-03-26 DIAGNOSIS — G4733 Obstructive sleep apnea (adult) (pediatric): Secondary | ICD-10-CM | POA: Diagnosis not present

## 2024-03-26 DIAGNOSIS — R296 Repeated falls: Secondary | ICD-10-CM | POA: Diagnosis not present

## 2024-03-27 LAB — URINE CULTURE

## 2024-03-29 ENCOUNTER — Ambulatory Visit (HOSPITAL_COMMUNITY): Admission: RE | Admit: 2024-03-29 | Source: Ambulatory Visit

## 2024-03-30 NOTE — Progress Notes (Signed)
 Cynthia Deleon                                          MRN: [id number]   03/30/2024   The [facility] Quality Team Specialist reviewed this patient medical record for the purposes of chart review for care gap closure. The following were reviewed: abstraction for care gap closure-glycemic status assessment and kidney health evaluation for diabetes:eGFR  and uACR.    [facility] Quality Team

## 2024-04-01 ENCOUNTER — Telehealth: Payer: Self-pay

## 2024-04-01 DIAGNOSIS — E1142 Type 2 diabetes mellitus with diabetic polyneuropathy: Secondary | ICD-10-CM

## 2024-04-01 NOTE — Telephone Encounter (Signed)
 Copied from CRM #[id number]. Topic: General - Other >> Apr 01, 2024 11:33 AM Jasmin G wrote: Reason for CRM: Pt wanted to let Ms. Rake's team know that she forgot to mention to Ms. Rakes that she also also needs Pull up's wipes since she has diarrhea. Call pt back at [phone] to discuss if needed.

## 2024-04-02 NOTE — Addendum Note (Signed)
 Addended by: Jahlon Baines D on: 04/02/2024 11:25 AM   Modules accepted: Orders

## 2024-04-02 NOTE — Telephone Encounter (Signed)
 Order updated and placed on PCP's desk

## 2024-04-06 NOTE — Telephone Encounter (Signed)
 Updated order faxed o [facility]

## 2024-04-08 DIAGNOSIS — E1165 Type 2 diabetes mellitus with hyperglycemia: Secondary | ICD-10-CM | POA: Diagnosis not present

## 2024-04-08 DIAGNOSIS — M5412 Radiculopathy, cervical region: Secondary | ICD-10-CM | POA: Diagnosis not present

## 2024-04-13 ENCOUNTER — Ambulatory Visit (HOSPITAL_COMMUNITY)
Admission: RE | Admit: 2024-04-13 | Discharge: 2024-04-13 | Disposition: A | Discharge status: Home / Self Care | Source: Ambulatory Visit | Attending: Acute Care | Admitting: Acute Care

## 2024-04-13 DIAGNOSIS — Z122 Encounter for screening for malignant neoplasm of respiratory organs: Secondary | ICD-10-CM | POA: Diagnosis present

## 2024-04-13 DIAGNOSIS — Z87891 Personal history of nicotine dependence: Secondary | ICD-10-CM | POA: Diagnosis present

## 2024-04-14 ENCOUNTER — Telehealth: Payer: Self-pay | Admitting: Pharmacist

## 2024-04-14 DIAGNOSIS — E1169 Type 2 diabetes mellitus with other specified complication: Secondary | ICD-10-CM

## 2024-04-16 NOTE — Telephone Encounter (Unsigned)
 Copied from CRM #[id number]. Topic: Clinical - Prescription Issue >> Apr 16, 2024  2:50 PM Lauren C wrote: Reason for CRM: Patient states she was supposed to receive incontinence supplies after her appointment with [facility] on 10/16, but she has not heard any updates about that. Requesting a call back at [phone]. Wants to make sure there are wipes added to that order.

## 2024-04-16 NOTE — Telephone Encounter (Signed)
 Informed pt that order has been updated and faxed to [facility], gave her their phone # [phone]  Pt would like an order for a 4 leg cane for her neuropathy she does really good with it getting in and out of her car, ok to send to [facility].

## 2024-04-19 ENCOUNTER — Other Ambulatory Visit: Payer: Self-pay | Admitting: Acute Care

## 2024-04-19 DIAGNOSIS — Z122 Encounter for screening for malignant neoplasm of respiratory organs: Secondary | ICD-10-CM

## 2024-04-19 DIAGNOSIS — Z87891 Personal history of nicotine dependence: Secondary | ICD-10-CM

## 2024-04-19 NOTE — Addendum Note (Signed)
 Addended by: Maxx Calaway D on: 04/19/2024 04:34 PM   Modules accepted: Orders

## 2024-04-19 NOTE — Telephone Encounter (Signed)
 Order printed and placed on PCP's desk

## 2024-04-22 MED ORDER — ATORVASTATIN CALCIUM 80 MG PO TABS: 80.0000 mg | ORAL_TABLET | Freq: Every day | ORAL | 1 refills | Status: AC

## 2024-04-22 NOTE — Telephone Encounter (Signed)
 Error

## 2024-04-22 NOTE — Telephone Encounter (Signed)
    Medication: atorvastatin   Refill needed for continued compliance Patient uses pill packaging via [employer] (Dispill)  90 Day supply sent with 1 refill Encouraged patient to follow up with cardiology A1c at goal 6.4% Continue current therapy  Reviewed medication indication, dosing, and goals of therapy. , Contacted pharmacy to facilitate refills., and Will collaborate with provider to facilitate refill needs.   Micaiah Remillard Dattero Alexzavier Girardin, PharmD, BCACP, CPP Clinical Pharmacist, [facility]

## 2024-04-22 NOTE — Telephone Encounter (Signed)
 Order faxed to [facility] on 04/20/24

## 2024-04-28 ENCOUNTER — Ambulatory Visit (INDEPENDENT_AMBULATORY_CARE_PROVIDER_SITE_OTHER): Admitting: Family Medicine

## 2024-04-28 ENCOUNTER — Ambulatory Visit: Payer: Self-pay

## 2024-04-28 ENCOUNTER — Encounter: Payer: Self-pay | Admitting: Family Medicine

## 2024-04-28 VITALS — BP 117/68 | HR 97 | Temp 97.8°F | Ht 69.0 in | Wt 163.4 lb

## 2024-04-28 DIAGNOSIS — R350 Frequency of micturition: Secondary | ICD-10-CM | POA: Diagnosis not present

## 2024-04-28 DIAGNOSIS — H1031 Unspecified acute conjunctivitis, right eye: Secondary | ICD-10-CM | POA: Diagnosis not present

## 2024-04-28 DIAGNOSIS — J309 Allergic rhinitis, unspecified: Secondary | ICD-10-CM

## 2024-04-28 DIAGNOSIS — N390 Urinary tract infection, site not specified: Secondary | ICD-10-CM

## 2024-04-28 DIAGNOSIS — N3001 Acute cystitis with hematuria: Secondary | ICD-10-CM | POA: Diagnosis not present

## 2024-04-28 LAB — URINALYSIS, ROUTINE W REFLEX MICROSCOPIC
Bilirubin, UA: NEGATIVE
Glucose, UA: NEGATIVE
Ketones, UA: NEGATIVE
Nitrite, UA: POSITIVE — AB
Specific Gravity, UA: 1.015 (ref 1.005–1.030)
Urobilinogen, Ur: 0.2 mg/dL (ref 0.2–1.0)
pH, UA: 7 (ref 5.0–7.5)

## 2024-04-28 LAB — MICROSCOPIC EXAMINATION
Renal Epithel, UA: NONE SEEN /HPF
WBC, UA: >30 /HPF — AB (ref 0–5)
Yeast, UA: NONE SEEN

## 2024-04-28 MED ORDER — GENTAMICIN SULFATE 0.3 % OP SOLN: 2.0000 [drp] | Freq: Four times a day (QID) | OPHTHALMIC | 0 refills | Status: AC

## 2024-04-28 MED ORDER — LEVOCETIRIZINE DIHYDROCHLORIDE 5 MG PO TABS: 2.5000 mg | ORAL_TABLET | Freq: Every evening | ORAL | 3 refills | Status: AC

## 2024-04-28 MED ORDER — CEPHALEXIN 500 MG PO CAPS: 500.0000 mg | ORAL_CAPSULE | Freq: Two times a day (BID) | ORAL | 0 refills | Status: DC

## 2024-04-28 NOTE — Telephone Encounter (Signed)
 FYI Only or Action Required?: FYI only for provider: appointment scheduled on 04/28/2024 at 3:45 PM.  Patient was last seen in primary care on 03/24/2024 by Severa Rock HERO, FNP.  Called Nurse Triage reporting Urinary Frequency and Painful urination.  Symptoms began several days ago.  Interventions attempted: Rest, hydration, or home remedies and Other: started taking Doxycycline  that she received from someone else.  Symptoms are: unchanged.  Triage Disposition: See Physician Within 24 Hours  Patient/caregiver understands and will follow disposition?: Yes  Copied from CRM #[id number]. Topic: Clinical - Red Word Triage >> Apr 28, 2024 11:54 AM Wess RAMAN wrote: Red Word that prompted transfer to Nurse Triage: Patient has another UTI. She took Doxycycline  100MG  that she got from someone else. She also stated it is giving her heartburn.  Symptoms: Painful urination, frequent urination. Reason for Disposition  Urinating more frequently than usual (i.e., frequency) OR new-onset of the feeling of an urgent need to urinate (i.e., urgency)  Answer Assessment - Initial Assessment Questions Patient reports urinary frequency and pain with urination since this weekend. Patient endorses she was given Doxycycline  100 mg by someone else and she has been taking it since the weekend.   1. SYMPTOM: What's the main symptom you're concerned about? (e.g., frequency, incontinence)     frequency 2. ONSET: When did the  frequency  start?     Started this past weekend.  3. PAIN: Is there any pain? If Yes, ask: How bad is it? (Scale: 1-10; mild, moderate, severe)     10 4. CAUSE: What do you think is causing the symptoms?     UTI 5. OTHER SYMPTOMS: Do you have any other symptoms? (e.g., blood in urine, fever, flank pain, pain with urination)     Abdominal pain, pain with urination,  Protocols used: Urinary Symptoms-A-AH

## 2024-04-28 NOTE — Progress Notes (Signed)
 Acute Office Visit  Subjective:     [name], female    DOB: Apr 06, 1956, 68 y.o.   MRN: [id number]  Chief Complaint  Patient presents with   Urinary Frequency    HPI  History of Present Illness   Cynthia Deleon is a 68 year old female with interstitial cystitis who presents with symptoms of a urinary tract infection.  Lower urinary tract symptoms - Dysuria, urgency, and frequency present since Saturday - Pain is severe, rated as 'ten', requiring analgesics - No visible hematuria, but concern for possible occurrence - No fever, flank pain, nausea, or vomiting - History of recurrent urinary tract infections   Facial pain - Right-sided facial pain, forehead pain x 2 days - watery eye drainage from right eye - No diplopia, changes in vision - No pain with eye movement - Eye is pink with some minor swelling that fluctuates under right eye  Allergic symptoms and sinus medication - Using Zyrtec  for allergies - Sinus medication regimen unchanged for many years - Perceives current allergy and sinus medications are no longer effective       ROS As per HPI.      Objective:    BP 117/68   Pulse 97   Temp 97.8 F (36.6 C) (Temporal)   Ht 5' 9 (1.753 m)   Wt 163 lb 6.4 oz (74.1 kg)   SpO2 98%   BMI 24.13 kg/m    Physical Exam Vitals and nursing note reviewed.  Constitutional:      General: She is not in acute distress.    Appearance: She is not ill-appearing, toxic-appearing or diaphoretic.  HENT:     Head:     Comments: No periorbital swelling or edema    Nose: Congestion present.     Mouth/Throat:     Mouth: Mucous membranes are moist.     Pharynx: Oropharynx is clear. No oropharyngeal exudate or posterior oropharyngeal erythema.  Eyes:     General: Lids are normal. Vision grossly intact. No scleral icterus.       Right eye: No discharge or hordeolum.        Left eye: No discharge or hordeolum.     Extraocular Movements:  Extraocular movements intact.     Right eye: Normal extraocular motion.     Left eye: Normal extraocular motion.     Conjunctiva/sclera:     Right eye: Right conjunctiva is injected. No exudate.    Left eye: Left conjunctiva is not injected. No exudate. Pulmonary:     Effort: Pulmonary effort is normal. No respiratory distress.  Abdominal:     Tenderness: There is no abdominal tenderness. There is no right CVA tenderness, left CVA tenderness, guarding or rebound.  Musculoskeletal:     Cervical back: Neck supple. No rigidity.  Skin:    General: Skin is warm and dry.  Neurological:     Mental Status: She is alert and oriented to person, place, and time. Mental status is at baseline.  Psychiatric:        Mood and Affect: Mood normal.        Behavior: Behavior normal.         Assessment & Plan:   Asmi was seen today for urinary frequency.  Diagnoses and all orders for this visit:  Urinary frequency -     Urinalysis, Routine w reflex microscopic  Acute cystitis with hematuria -     cephALEXin  (KEFLEX ) 500 MG capsule; Take 1 capsule (  500 mg total) by mouth 2 (two) times daily for 7 days. -     Urine Culture  Acute bacterial conjunctivitis of right eye -     gentamicin  (GARAMYCIN ) 0.3 % ophthalmic solution; Place 2 drops into the right eye 4 (four) times daily for 7 days.  Recurrent UTI -     Ambulatory referral to Urology  Chronic allergic rhinitis -     levocetirizine (XYZAL ) 5 MG tablet; Take 0.5 tablets (2.5 mg total) by mouth every evening.   Assessment and Plan    Acute urinary tract infection with hematuria and recurrent urinary tract infections Hx of recurrent UTIs. - Prescribed Keflex  twice daily for one week. - Sent urine culture to confirm antibiotic sensitivity. - Referred to urologist for evaluation of recurrent UTIs - Schedule follow-up appointment in three weeks to recheck hematuria.  Acute conjunctivitis, right eye Will cover for bacterial etiology.   - Prescribed eye drops for the right eye every four hours while awake for one week.  Allergic rhinitis Chronic allergic rhinitis with nasal congestion and sinus pressure. Zyrtec  ineffective.  - Prescribed generic Xyzal , half a tablet at bedtime. - Discontinued Zyrtec .      Return in about 3 weeks (around 05/19/2024) for hematuria follow up.  The patient indicates understanding of these issues and agrees with the plan.  Cynthia Deleon Search, FNP

## 2024-04-28 NOTE — Telephone Encounter (Signed)
 Apt scheduled.

## 2024-04-28 NOTE — Telephone Encounter (Signed)
 Pt has appt

## 2024-05-02 LAB — URINE CULTURE

## 2024-05-03 ENCOUNTER — Ambulatory Visit: Payer: Self-pay | Admitting: Family Medicine

## 2024-05-03 DIAGNOSIS — N3001 Acute cystitis with hematuria: Secondary | ICD-10-CM

## 2024-05-03 MED ORDER — CEFDINIR 300 MG PO CAPS: 300.0000 mg | ORAL_CAPSULE | Freq: Two times a day (BID) | ORAL | 0 refills | Status: AC

## 2024-05-03 MED ORDER — FLUCONAZOLE 150 MG PO TABS: ORAL_TABLET | ORAL | 0 refills | Status: DC

## 2024-05-05 ENCOUNTER — Other Ambulatory Visit: Payer: Self-pay | Admitting: Family Medicine

## 2024-05-05 DIAGNOSIS — E11649 Type 2 diabetes mellitus with hypoglycemia without coma: Secondary | ICD-10-CM

## 2024-05-14 ENCOUNTER — Ambulatory Visit: Admitting: Cardiovascular Disease

## 2024-05-27 NOTE — Progress Notes (Signed)
 Pharmacy Quality Measure Review  This patient is appearing on a report for being at risk of failing the adherence measure for hypertension (ACEi/ARB) medications this calendar year.   Medication: Lisinopril  5mg  Last fill date: 12/15 for 30 day supply  Insurance report was not up to date. No action needed at this time.   Jacobb Alen, PharmD [facility] Pharmacist

## 2024-06-07 ENCOUNTER — Telehealth: Payer: Self-pay | Admitting: Pharmacist

## 2024-06-07 DIAGNOSIS — E11649 Type 2 diabetes mellitus with hypoglycemia without coma: Secondary | ICD-10-CM

## 2024-06-07 MED ORDER — FREESTYLE LIBRE 3 PLUS SENSOR MISC: 3 refills | Status: AC

## 2024-06-07 NOTE — Telephone Encounter (Signed)
" ° °  Pharmacy Quality Measure Review   **Duplicate** PharmD already reviewed   This patient is appearing on a report for being at risk of failing the adherence measure for hypertension (ACEi/ARB) medications this calendar year.   Medication: Lisinopril  5mg  Last fill date: 12/15 for 30 day supply   Insurance report was not up to date. No action needed at this time.     Libre 3 PLUS CGM refills called in.  Patient last filled on 11/1 for 30 days.  Patient on pill packs so only 30 day supplies.  Adelfa Lozito Dattero Jessee Newnam, PharmD, BCACP, CPP Clinical Pharmacist, [facility]  "

## 2024-06-08 ENCOUNTER — Ambulatory Visit: Payer: Self-pay | Admitting: Family Medicine

## 2024-06-08 NOTE — Telephone Encounter (Signed)
 FYI Only or Action Required?: Action required by provider: patient is requesting tamiflu .  Patient was last seen in primary care on 04/28/2024 by Joesph Annabella HERO, FNP.  Called Nurse Triage reporting flu exposure and Sore Throat.  Symptoms began yesterday.  Interventions attempted: OTC medications: aleve.  Symptoms are: stable.  Triage Disposition: See Physician Within 24 Hours  Patient/caregiver understands and will follow disposition?: Yes         Reason for Disposition  Diabetes mellitus or weak immune system (e.g., HIV positive, cancer chemo, splenectomy, organ transplant, chronic steroids)  Answer Assessment - Initial Assessment Questions Patient reports had flu shot , recently took care of someone that had the flu. Exposed last week. Symptoms started yesterday with sore throat. Taking aleve doesn't think has  a fever. Sore throat 5-6/10 . Headache is 5/10 back of head around forehead. Patient requesting medication for flu tamflu was told pharmacy on file will deliver this is ordered. Patient did report can do visit video if needed but doesn't know how to do this. Advised patient will send to provider request for prescription.      1. ONSET: When did the throat start hurting? (Hours or days ago)      Yesterday sore throat , body aches and headache  2. SEVERITY: How bad is the sore throat? (Scale 1-10; mild, moderate or severe)     5/10 able to swallow liquids and food  3. STREP EXPOSURE: Has there been any exposure to strep within the past week? If Yes, ask: What type of contact occurred?      Unknown but flu exposure  4.  VIRAL SYMPTOMS: Are there any symptoms of a cold, such as a runny nose, cough, hoarse voice or red eyes?      Runny nose  5. FEVER: Do you have a fever? If Yes, ask: What is your temperature, how was it measured, and when did it start?     Unsure taking aleve  6. PUS ON THE TONSILS: Is there pus on the tonsils in the back of your  throat?     Does not have tonsils 7. OTHER SYMPTOMS: Do you have any other symptoms? (e.g., difficulty breathing, headache, rash) Does have headache 5/10 able to move neck normally . Body aches       Patient denies the following difficulty breathing, chest pain, inability to swallow , ear pain , dizziness  Protocols used: Sore Throat-A-AH  Copied from CRM #[id number]. Topic: Clinical - Red Word Triage >> Jun 08, 2024 10:18 AM Miquel SAILOR wrote: Red Word that prompted transfer to Nurse Triage: PT has Severe Flu /Started as of 12/29. Headache/stuff nose/Sore throat/Body aches/Having trouble sleeping/ Had Covid shot on 12/29.

## 2024-06-09 ENCOUNTER — Telehealth: Admitting: Family Medicine

## 2024-06-09 DIAGNOSIS — Z20828 Contact with and (suspected) exposure to other viral communicable diseases: Secondary | ICD-10-CM

## 2024-06-09 DIAGNOSIS — R059 Cough, unspecified: Secondary | ICD-10-CM | POA: Diagnosis not present

## 2024-06-09 DIAGNOSIS — R6883 Chills (without fever): Secondary | ICD-10-CM | POA: Diagnosis not present

## 2024-06-09 DIAGNOSIS — R6889 Other general symptoms and signs: Secondary | ICD-10-CM

## 2024-06-09 MED ORDER — OSELTAMIVIR PHOSPHATE 75 MG PO CAPS: 75.0000 mg | ORAL_CAPSULE | Freq: Two times a day (BID) | ORAL | 0 refills | Status: AC

## 2024-06-09 NOTE — Progress Notes (Addendum)
 "   Virtual Visit via Video   I connected with patient on 06/09/2024 at home by a video enabled telemedicine application and verified that I am speaking with the correct person using two identifiers.  Location patient: Home Location provider: [facility] Persons participating in the virtual visit: Patient and Provider  I discussed the limitations of evaluation and management by telemedicine and the availability of in person appointments. The patient expressed understanding and agreed to proceed.  Subjective:   HPI:  Pt presents today for No chief complaint on file.  Patient states that she was exposed to the flu last week and developed flu like symptoms on 06/07/24.   Positive for chills, cough, nasal congestion, sore throat, fatigue.  Negative for fever or ShOB.  Reports having flu shot this season.   Requesting tamiflu.   Review of Systems  Constitutional:  Positive for chills and malaise/fatigue. Negative for fever.  HENT:  Positive for congestion and sore throat.   Respiratory:  Positive for cough. Negative for shortness of breath.   All other systems reviewed and are negative.    Patient Active Problem List   Diagnosis Date Noted   Urge incontinence of urine 03/24/2024   Diabetes mellitus treated with injections of non-insulin  medication (HCC) 03/24/2024   Dysuria 03/17/2024   Irritable bowel syndrome with diarrhea 03/11/2024   Diabetes mellitus (HCC) 08/12/2023   Abdominal pain 06/17/2023   Colitis 05/05/2023   Diabetes mellitus treated with insulin  and oral medication (HCC) 11/22/2022   Long-term current use of injectable noninsulin antidiabetic medication 11/22/2022   S/P hysterectomy 10/28/2022   History of shingles 10/28/2022   Chronic allergic rhinitis 08/22/2022   Seasonal allergic rhinitis due to pollen 03/29/2022   Polypharmacy 03/29/2022   Hyperlipidemia associated with type 2 diabetes mellitus (HCC) 02/06/2022   Hx of  adenomatous polyp of colon 12/25/2021   Genitourinary syndrome of menopause 12/07/2021   Osteopenia 10/31/2021   Aortic atherosclerosis 10/31/2021   Hepatic steatosis 10/31/2021   Falls frequently 10/26/2021   Constipation 10/26/2021   Gait abnormality 05/21/2021   Lumbar radiculopathy 05/02/2021   Polyneuropathy due to type 2 diabetes mellitus (HCC) 10/10/2020   Seasonal allergies 10/10/2020   Stress incontinence of urine 06/20/2020   Diplopia 03/20/2020   GERD (gastroesophageal reflux disease)    Hypertension associated with diabetes (HCC)    Incontinence of feces    COPD without exacerbation (HCC)    Osteoarthritis of left knee 04/01/2019   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   Type 2 diabetes mellitus with hypoglycemia without coma, without long-term current use of insulin  (HCC) 07/06/2018   Bilateral temporomandibular joint pain 01/21/2018   OSA (obstructive sleep apnea) 01/21/2018   Vertigo 01/21/2018   DDD (degenerative disc disease), cervical 07/17/2017   Degeneration of lumbar intervertebral disc 07/17/2017   OCD (obsessive compulsive disorder) 11/15/2015   Tobacco use disorder 11/15/2015   Current use of estrogen therapy 01/12/2014   PTSD (post-traumatic stress disorder) 08/03/2011   Bipolar I disorder, most recent episode mixed, severe with psychotic features (HCC)     Social History   Tobacco Use   Smoking status: Former    Current packs/day: 0.25    Average packs/day: 1 pack/day for 44.0 years (42.5 ttl pk-yrs)    Types: Cigarettes    Start date: 06/11/1975    Quit date: 06/10/2017   Smokeless tobacco: Never  Substance Use Topics   Alcohol  use: No    Alcohol /week: 0.0 standard drinks of alcohol    Current Medications[1]  Allergies[2]  Objective:   There were no vitals taken for this visit.  Patient is well-developed, well-nourished in no acute distress.  Resting comfortably at home.  Head is normocephalic, atraumatic.  No labored breathing.  Speech is clear and  coherent with logical content.  Patient is alert and oriented at baseline.    Assessment and Plan:   Diagnoses and all orders for this visit:  Flu-like symptoms -     oseltamivir (TAMIFLU) 75 MG capsule; Take 1 capsule (75 mg total) by mouth 2 (two) times daily for 5 days.  Exposure to the flu -     oseltamivir (TAMIFLU) 75 MG capsule; Take 1 capsule (75 mg total) by mouth 2 (two) times daily for 5 days.    Ordered tamiflu BID x 5 days.  Discussed symptomatic care for flu like symptoms.   Follow up if symptoms acutely worsen, no improvement over the next 2-3 days, fever develops, or for any other concerns   Return if symptoms worsen or fail to improve.  Oneil Severin, FNP-C [facility] [address] [place], [place] [address] [phone]  06/09/2024  Time spent with the patient: 15 minutes, of which >50% was spent in obtaining information about symptoms, reviewing previous labs, evaluations, and treatments, counseling about condition (please see the discussed topics above), and developing a plan to further investigate it; had a number of questions which I addressed.      [1]  Current Outpatient Medications:    oseltamivir (TAMIFLU) 75 MG capsule, Take 1 capsule (75 mg total) by mouth 2 (two) times daily for 5 days., Disp: 10 capsule, Rfl: 0   albuterol  (VENTOLIN  HFA) 108 (90 Base) MCG/ACT inhaler, Inhale 1-2 puffs into the lungs every 6 (six) hours as needed for wheezing or shortness of breath., Disp: 8.5 g, Rfl: 3   ARIPiprazole  (ABILIFY ) 10 MG tablet, Take 10 mg by mouth every morning., Disp: , Rfl:    Artificial Saliva (BIOTENE DRY MOUTH MOISTURIZING) SOLN, Use as directed 5 mLs in the mouth or throat in the morning and at bedtime., Disp: 44.3 mL, Rfl: 0   aspirin EC 81 MG tablet, Take 81 mg by mouth daily. Swallow whole., Disp: , Rfl:    atorvastatin  (LIPITOR) 80 MG tablet, Take 1 tablet (80 mg total) by mouth daily., Disp: 90 tablet, Rfl: 1    cholecalciferol  (VITAMIN D ) 25 MCG tablet, Take 1 tablet (1,000 Units total) by mouth daily., Disp: , Rfl:    Continuous Glucose Receiver (FREESTYLE LIBRE 3 READER) DEVI, with compatible Freestyle Libre 3 sensor to monitor glucose continuously. DX: E11.65, Disp: 1 each, Rfl: 1   Continuous Glucose Sensor (FREESTYLE LIBRE 3 PLUS SENSOR) MISC, APPLY NEW SENSOR EVERY 15 DAYS TO CONTINUOUSLY MONITOR BLOOD GLUCOSE. DX: e11.9, Disp: 6 each, Rfl: 3   diazepam  (VALIUM ) 5 MG tablet, Take 5 mg by mouth 2 (two) times daily., Disp: , Rfl:    DULoxetine  (CYMBALTA ) 60 MG capsule, TAKE (1) CAPSULE BY MOUTH EVERY DAY., Disp: 30 capsule, Rfl: 0   estrogens , conjugated, (PREMARIN ) 0.3 MG tablet, TAKE (1) TABLET BY MOUTH ONCE DAILY., Disp: 30 tablet, Rfl: 10   ezetimibe  (ZETIA ) 10 MG tablet, TAKE (1) TABLET BY MOUTH ONCE DAILY., Disp: 90 tablet, Rfl: 1   fluconazole  (DIFLUCAN ) 150 MG tablet, Take one tablet PO and repeat in 3 days if symptoms persist., Disp: 2 tablet, Rfl: 0   fluorometholone (FML) 0.1 % ophthalmic suspension, SMARTSIG:In Eye(s), Disp: , Rfl:    fluticasone  (  FLONASE ) 50 MCG/ACT nasal spray, Place 2 sprays into both nostrils daily., Disp: 16 g, Rfl: 0   hyoscyamine  (LEVSIN  SL) 0.125 MG SL tablet, Place 1 tablet (0.125 mg total) under the tongue every 8 (eight) hours as needed (abdominal pain)., Disp: 60 tablet, Rfl: 2   insulin  lispro (HUMALOG  KWIKPEN) 100 UNIT/ML KwikPen, Use 5 units 2 times daily with breakfast and dinner when blood sugar is >200 on steroids, Disp: 15 mL, Rfl: 11   Insulin  Pen Needle (PIP PEN NEEDLES 32G X ) 32G X 4 MM MISC, Use to inject insulin  2 times daily as needed. DX E11.65, Disp: 100 each, Rfl: 11   lamoTRIgine  (LAMICTAL ) 200 MG tablet, Take 200 mg by mouth at bedtime. For mood disorder., Disp: , Rfl:    lansoprazole  (PREVACID ) 30 MG capsule, Take 1 capsule (30 mg total) by mouth 2 (two) times daily before a meal., Disp: 180 capsule, Rfl: 1   levocetirizine (XYZAL ) 5 MG  tablet, Take 0.5 tablets (2.5 mg total) by mouth every evening., Disp: 30 tablet, Rfl: 3   lisinopril  (ZESTRIL ) 5 MG tablet, Take 1 tablet (5 mg total) by mouth daily., Disp: 30 tablet, Rfl: 5   meclizine  (ANTIVERT ) 25 MG tablet, Take 2 tablets (50 mg total) by mouth daily as needed for dizziness., Disp: 60 tablet, Rfl: 2   montelukast  (SINGULAIR ) 10 MG tablet, TAKE (1) TABLET BY MOUTH AT BEDTIME., Disp: 90 tablet, Rfl: 3   MYRBETRIQ  50 MG TB24 tablet, Take 1 tablet (50 mg total) by mouth daily., Disp: 180 tablet, Rfl: 1   omeprazole  (PRILOSEC) 40 MG capsule, Take 1 capsule (40 mg total) by mouth daily., Disp: 90 capsule, Rfl: 3   OneTouch Delica Lancets 30G MISC, Use to test blood sugar twice daily. DX: E11.9, Disp: 100 each, Rfl: 3   oxyCODONE  (ROXICODONE ) 15 MG immediate release tablet, Take 1 tablet (15 mg total) by mouth every 6 (six) hours as needed for pain., Disp: 10 tablet, Rfl: 0   pregabalin  (LYRICA ) 100 MG capsule, Take 2 capsules by mouth 3 (three) times daily., Disp: , Rfl:    promethazine  (PHENERGAN ) 12.5 MG tablet, Take 1 tablet (12.5 mg total) by mouth every 8 (eight) hours as needed for nausea or vomiting., Disp: 20 tablet, Rfl: 3   tirzepatide  (MOUNJARO ) 15 MG/0.5ML Pen, Inject 15 mg into the skin once a week. DX: E11.9, Disp: 6 mL, Rfl: 1   triamcinolone  cream (KENALOG ) 0.1 %, Apply 1 Application topically 2 (two) times daily., Disp: 30 g, Rfl: 0   umeclidinium-vilanterol (ANORO ELLIPTA ) 62.5-25 MCG/ACT AEPB, Inhale 1 puff into the lungs daily., Disp: 60 each, Rfl: 11   valACYclovir  (VALTREX ) 1000 MG tablet, Take 1 tablet (1,000 mg total) by mouth daily. Please put in dispill, Disp: 30 tablet, Rfl: 12   vitamin B-12 1000 MCG tablet, Take 1 tablet (1,000 mcg total) by mouth daily., Disp: , Rfl:    VRAYLAR 3 MG capsule, Take 3 mg by mouth at bedtime., Disp: , Rfl:  [2]  Allergies Allergen Reactions   Divalproex Sodium Other (See Comments)    Hallucinations   Other reaction(s):  Confusion  Hallucinations     Other reaction(s): Confusion    Hallucinations   Other Anaphylaxis, Other (See Comments), Nausea And Vomiting and Nausea Only   Pseudoeph-Hydrocodone -Gg Nausea Only   Sulfa Antibiotics Anaphylaxis, Nausea Only, Other (See Comments) and Swelling    Swelling of tongue.   Valproic Acid Other (See Comments)   Varenicline Other (See Comments)  Altered mental status-Per patient was put on allergy list by Dr Todd Cliche bu patient staes she is not allergic to this and is currently taking it.   Varenicline Tartrate Other (See Comments)    Other reaction(s): Confusion   Codeine Nausea And Vomiting and Other (See Comments)    Other reaction(s): vomiting   Hydrocodone  Nausea And Vomiting and Other (See Comments)   Acetaminophen  Nausea Only   Ambien  [Zolpidem ] Other (See Comments)    Causes sleep walking   Clavulanic Acid Diarrhea   Elemental Sulfur Other (See Comments)    Swelling of tongue   Hydrocod Poli-Chlorphe Poli Er Other (See Comments)    Other reaction(s): Skin itches    Macrobid  [Nitrofurantoin ] Nausea And Vomiting    Dizziness, nausea, vomiting   Paxil [Paroxetine Hcl] Other (See Comments)    Causes ringing in the ears.    Prednisone  Other (See Comments)    Other reaction(s): Unknown    Sulfur Other (See Comments)    sulfur   Amoxicillin  Rash    Has patient had a PCN reaction causing immediate rash, facial/tongue/throat swelling, SOB or lightheadedness with hypotension: No Has patient had a PCN reaction causing severe rash involving mucus membranes or skin necrosis: No Has patient had a PCN reaction that required hospitalization: No Has patient had a PCN reaction occurring within the last 10 years: Yes If all of the above answers are NO, then may proceed with Cephalosporin use.    Farxiga  [Dapagliflozin ]     RECURRENT YEAST/UTI   Metformin  And Related Diarrhea   Tape Rash   "

## 2024-06-14 ENCOUNTER — Ambulatory Visit: Payer: Self-pay

## 2024-06-14 NOTE — Telephone Encounter (Signed)
 FYI Only or Action Required?: FYI only for provider: UC advised.  Patient was last seen in primary care on 06/09/2024 by Alcus Oneil ORN, FNP.  Called Nurse Triage reporting Cough.  Symptoms began a week ago.  Interventions attempted: OTC medications: store brand cold and flu syrup and Prescription medications: Tamiflu  and inhalers.  Symptoms are: unchanged.  Triage Disposition: See HCP Within 4 Hours (Or PCP Triage)  Patient/caregiver understands and will follow disposition?: Yes                                  1. ONSET: When did the cough begin?      About a week ago  2. SEVERITY: How bad is the cough today?      Moderate to severe 3. SPUTUM: Describe the color of your sputum (e.g., none, dry cough; clear, white, yellow, green)     Yellow 4. HEMOPTYSIS: Are you coughing up any blood? If Yes, ask: How much? (e.g., flecks, streaks, tablespoons, etc.)     Denies 5. DIFFICULTY BREATHING: Are you having difficulty breathing? If Yes, ask: How bad is it? (e.g., mild, moderate, severe)      Yes, but occurs during or directly after a coughing spell  6. FEVER: Do you have a fever? If Yes, ask: What is your temperature, how was it measured, and when did it start?     Denies 7. CARDIAC HISTORY: Do you have any history of heart disease? (e.g., heart attack, congestive heart failure)      HTN, per chart 8. LUNG HISTORY: Do you have any history of lung disease?  (e.g., pulmonary embolus, asthma, emphysema)     COPD, OSA, per chart 10. OTHER SYMPTOMS: Do you have any other symptoms? (e.g., runny nose, wheezing, chest pain)     Wheezing Denies chest pain    Patient has a virtual appointment on 06/09/24 and was prescribed Tamiflu . Patient called to report a lingering productive cough and wheezing. This RN advised in-person evaluation at [facility] today. Patient verbalized understanding and was agreeable to going to UC.   Reason for  Disposition  Wheezing is present  Protocols used: Cough - Acute Productive-A-AH  Copied from CRM #[id number]. Topic: Clinical - Red Word Triage >> Jun 14, 2024  3:55 PM [name] wrote: Red Word that prompted transfer to Nurse Triage: Patient states she has the flu, had a telemed visit on 12/31 but still has a cough with discolored mucous and thrush in her mouth, not feeling any better.

## 2024-06-14 NOTE — Telephone Encounter (Signed)
 Noted

## 2024-06-16 ENCOUNTER — Ambulatory Visit: Payer: Self-pay | Admitting: *Deleted

## 2024-06-16 NOTE — Telephone Encounter (Signed)
 Noted

## 2024-06-16 NOTE — Telephone Encounter (Signed)
 FYI Only or Action Required?: FYI only for provider: appointment scheduled on 1/8.  Patient was last seen in primary care on 06/09/2024 by Alcus Oneil ORN, FNP.  Called Nurse Triage reporting Cough (Lack of energy, lack of appetite ).  Symptoms began a week ago.  Interventions attempted: OTC medications: cold and flu OTC, Prescription medications: Albuterol , and Rest, hydration, or home remedies.  Symptoms are: gradually improving.  Triage Disposition: See PCP When Office is Open (Within 3 Days)  Patient/caregiver understands and will follow disposition?: Yes  Copied from CRM #[id number]. Topic: Clinical - Red Word Triage >> Jun 16, 2024  1:41 PM Mercer PEDLAR wrote: Red Word that prompted transfer to Nurse Triage: low appetite, productive cough, low energy levels. Reason for Disposition  Cough has been present for > 3 weeks  Answer Assessment - Initial Assessment Questions 1. ONSET: When did the cough begin?      12/30- Patient has been treated for flu with Tamiflu  - she has been using Albuterol  inhaler and states her cough is much better.  2. SEVERITY: How bad is the cough today?      Cough has improved- still has weakness, fatigue and lack of appetite  3. SPUTUM: Describe the color of your sputum (e.g., none, dry cough; clear, white, yellow, green)     clear 4. HEMOPTYSIS: Are you coughing up any blood? If Yes, ask: How much? (e.g., flecks, streaks, tablespoons, etc.)     no 5. DIFFICULTY BREATHING: Are you having difficulty breathing? If Yes, ask: How bad is it? (e.g., mild, moderate, severe)      Using inhaler- is helping 6. FEVER: Do you have a fever? If Yes, ask: What is your temperature, how was it measured, and when did it start?     no 7. CARDIAC HISTORY: Do you have any history of heart disease? (e.g., heart attack, congestive heart failure)      hypertension 8. LUNG HISTORY: Do you have any history of lung disease?  (e.g., pulmonary embolus, asthma,  emphysema)     COPD 9. PE RISK FACTORS: Do you have a history of blood clots? (or: recent major surgery, recent prolonged travel, bedridden)     no 10. OTHER SYMPTOMS: Do you have any other symptoms? (e.g., runny nose, wheezing, chest pain)       Fatigue and lack of appetite   12. TRAVEL: Have you traveled out of the country in the last month? (e.g., travel history, exposures)       Flu exposure and treatment  Protocols used: Cough - Acute Productive-A-AH

## 2024-06-17 ENCOUNTER — Ambulatory Visit: Admitting: Family Medicine

## 2024-06-17 ENCOUNTER — Encounter: Payer: Self-pay | Admitting: Family Medicine

## 2024-06-17 VITALS — BP 121/70 | HR 102 | Temp 97.7°F | Ht 69.0 in | Wt 153.0 lb

## 2024-06-17 DIAGNOSIS — R6889 Other general symptoms and signs: Secondary | ICD-10-CM

## 2024-06-17 DIAGNOSIS — B370 Candidal stomatitis: Secondary | ICD-10-CM

## 2024-06-17 NOTE — Progress Notes (Signed)
 "  Acute Office Visit  Subjective:     [name], female    DOB: 1956/05/11, 69 y.o.   MRN: [id number]  Chief Complaint  Patient presents with   Fatigue    She was seen 12/31 and states she is still fatigue      HPI  History of Present Illness   Cynthia Deleon is a 69 year old female who presents with persistent symptoms following a flu infection.  She contracted the flu approximately nine days ago, experiencing fever, chills, cough, congestion, and fatigue. She completed tamiflu . Her cough persists and is productive with clear sputum. She reports that her cough has been improving. She denies wheezing or shortness of breath. She feels better than when the symptoms first started but still experiences fatigue and lacks appetite. She denies fever, chills, sore throat, ear pain, nausea, vomiting, diarrhea, or chest pain. She reports that her nasal congestion has been improving as well. She has been taking mucinex  with some relief.   She developed oral thrush last week, characterized by white patches on her tongue. She took two doses of Diflucan , which improved her symptoms significantly, although she still experiences mild irritation. She last took Diflucan  three days ago.       ROS As per HPI.      Objective:    BP 121/70   Pulse (!) 102   Temp 97.7 F (36.5 C)   Ht 5' 9 (1.753 m)   Wt 153 lb (69.4 kg)   SpO2 94%   BMI 22.59 kg/m    Physical Exam Vitals and nursing note reviewed.  Constitutional:      General: She is not in acute distress.    Appearance: Normal appearance. She is not ill-appearing, toxic-appearing or diaphoretic.  HENT:     Right Ear: Tympanic membrane, ear canal and external ear normal.     Left Ear: Tympanic membrane and external ear normal.     Nose: Congestion present.     Mouth/Throat:     Mouth: Mucous membranes are moist.     Tongue: No lesions.     Pharynx: No oropharyngeal exudate or posterior oropharyngeal erythema.   Eyes:     General:        Right eye: No discharge.        Left eye: No discharge.  Cardiovascular:     Rate and Rhythm: Normal rate and regular rhythm.     Pulses: Normal pulses.     Heart sounds: Normal heart sounds. No murmur heard. Pulmonary:     Effort: Pulmonary effort is normal. No respiratory distress.     Breath sounds: Normal breath sounds.  Abdominal:     General: Bowel sounds are normal. There is no distension.     Palpations: Abdomen is soft. There is no mass.     Tenderness: There is no abdominal tenderness. There is no guarding or rebound.  Musculoskeletal:     Cervical back: Neck supple.     Right lower leg: No edema.     Left lower leg: No edema.  Lymphadenopathy:     Cervical: No cervical adenopathy.  Skin:    General: Skin is warm and dry.  Neurological:     Mental Status: She is alert and oriented to person, place, and time. Mental status is at baseline.  Psychiatric:        Mood and Affect: Mood normal.        Behavior: Behavior normal.  No results found for any visits on 06/17/24.      Assessment & Plan:   Cynthia Deleon was seen today for fatigue.  Diagnoses and all orders for this visit:  Flu-like symptoms  Oral candidiasis   Assessment and Plan    Influenza  Symptoms improving. Lungs clear one exam. Discussed symptomatic management and that fatigue can last from some time following the flu.  - Continue current management. - Monitor for changes in cough, such as colored sputum, increased wheezing, shortness of breath, or new fever. - Encouraged rest and self-care. - Advised to report if symptoms worsen.  Oral candidiasis Resolved on exam today - No further Diflucan  needed at this time.      Return to office for new or worsening symptoms, or if symptoms persist.   The patient indicates understanding of these issues and agrees with the plan.   Cynthia CHRISTELLA Search, FNP   "

## 2024-06-18 ENCOUNTER — Other Ambulatory Visit: Payer: Self-pay | Admitting: Family Medicine

## 2024-06-18 DIAGNOSIS — Z79818 Long term (current) use of other agents affecting estrogen receptors and estrogen levels: Secondary | ICD-10-CM

## 2024-06-18 DIAGNOSIS — N952 Postmenopausal atrophic vaginitis: Secondary | ICD-10-CM

## 2024-06-25 ENCOUNTER — Other Ambulatory Visit

## 2024-07-02 ENCOUNTER — Ambulatory Visit: Admitting: Cardiovascular Disease

## 2024-07-02 ENCOUNTER — Telehealth: Payer: Self-pay
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
## 2024-07-11 NOTE — Progress Notes (Unsigned)
" °  Cardiology Office Note:  .   Date:  07/11/2024  ID:  Cynthia Deleon, DOB 01-May-1956, MRN [id number] PCP: Severa Rock HERO, FNP  [facility] Providers Cardiologist:  None   History of Present Illness: .   No chief complaint on file.   Cynthia Deleon is a 69 y.o. female with below history who presents for follow-up.   History of Present Illness               Problem List COPD -30 years 1 ppd -moderate COPD -quite 2019 OSA DM -A1c 6.4 4. HLD -T chol 131, HDL 39, LDL 63, TG 169 5. HTN 6. Coronary calcifications  -Mild LAD    ROS: All other ROS reviewed and negative. Pertinent positives noted in the HPI.     Studies Reviewed: SABRA       TTE 02/15/2022  1. Left ventricular ejection fraction, by estimation, is 65 to 70%. The  left ventricle has normal function. The left ventricle has no regional  wall motion abnormalities. There is mild concentric left ventricular  hypertrophy. Left ventricular diastolic  parameters are consistent with Grade I diastolic dysfunction (impaired  relaxation).   2. Right ventricular systolic function is normal. The right ventricular  size is normal. There is normal pulmonary artery systolic pressure.   3. The mitral valve is normal in structure. No evidence of mitral valve  regurgitation. No evidence of mitral stenosis.   4. The aortic valve is tricuspid. Aortic valve regurgitation is not  visualized. No aortic stenosis is present.   5. The inferior vena cava is normal in size with greater than 50%  respiratory variability, suggesting right atrial pressure of 3 mmHg.   Physical Exam:   VS:  There were no vitals taken for this visit.   Wt Readings from Last 3 Encounters:  06/17/24 153 lb (69.4 kg)  04/28/24 163 lb 6.4 oz (74.1 kg)  03/24/24 166 lb 6.4 oz (75.5 kg)    GEN: Well nourished, well developed in no acute distress NECK: No JVD; No carotid bruits CARDIAC: ***RRR, no murmurs, rubs, gallops RESPIRATORY:  Clear to  auscultation without rales, wheezing or rhonchi  ABDOMEN: Soft, non-tender, non-distended EXTREMITIES:  No edema; No deformity  ASSESSMENT AND PLAN: .   Assessment and Plan                 {Are you ordering a CV Procedure (e.g. stress test, cath, DCCV, TEE, etc)?   Press F2        :[id number]}   Follow-up: No follow-ups on file.  Signed, Darryle DASEN. Barbaraann, MD, [facility]  [facility]  [address] [place], [place] [address] [phone]  6:25 PM ] "

## 2024-07-14 ENCOUNTER — Ambulatory Visit: Admitting: Cardiovascular Disease

## 2024-07-14 DIAGNOSIS — I10 Essential (primary) hypertension: Secondary | ICD-10-CM

## 2024-07-14 DIAGNOSIS — I2510 Atherosclerotic heart disease of native coronary artery without angina pectoris: Secondary | ICD-10-CM

## 2024-07-14 DIAGNOSIS — E782 Mixed hyperlipidemia: Secondary | ICD-10-CM

## 2024-07-26 ENCOUNTER — Ambulatory Visit: Admitting: Urology

## 2024-07-27 ENCOUNTER — Ambulatory Visit: Admitting: Primary Care

## 2024-09-10 ENCOUNTER — Ambulatory Visit: Admitting: Cardiovascular Disease

## 2024-12-27 ENCOUNTER — Ambulatory Visit

## 2024-12-28 ENCOUNTER — Ambulatory Visit: Payer: Self-pay
